# Patient Record
Sex: Female | Born: 1950 | Race: White | Hispanic: No | Marital: Married | Smoking: Never smoker
Health system: Southern US, Community
[De-identification: ages and names within clinical notes are randomized; demographics above are authoritative.]

## PROBLEM LIST (undated history)

## (undated) DIAGNOSIS — I700 Atherosclerosis of aorta: Secondary | ICD-10-CM

## (undated) DIAGNOSIS — I639 Cerebral infarction, unspecified: Secondary | ICD-10-CM

## (undated) DIAGNOSIS — I471 Supraventricular tachycardia, unspecified: Secondary | ICD-10-CM

## (undated) DIAGNOSIS — R05 Cough: Secondary | ICD-10-CM

## (undated) DIAGNOSIS — I2510 Atherosclerotic heart disease of native coronary artery without angina pectoris: Secondary | ICD-10-CM

## (undated) DIAGNOSIS — I10 Essential (primary) hypertension: Secondary | ICD-10-CM

## (undated) DIAGNOSIS — Z8673 Personal history of transient ischemic attack (TIA), and cerebral infarction without residual deficits: Secondary | ICD-10-CM

## (undated) DIAGNOSIS — Z972 Presence of dental prosthetic device (complete) (partial): Secondary | ICD-10-CM

## (undated) DIAGNOSIS — K08109 Complete loss of teeth, unspecified cause, unspecified class: Secondary | ICD-10-CM

## (undated) DIAGNOSIS — G459 Transient cerebral ischemic attack, unspecified: Secondary | ICD-10-CM

## (undated) DIAGNOSIS — R053 Chronic cough: Secondary | ICD-10-CM

## (undated) DIAGNOSIS — N3281 Overactive bladder: Secondary | ICD-10-CM

## (undated) DIAGNOSIS — H66005 Acute suppurative otitis media without spontaneous rupture of ear drum, recurrent, left ear: Secondary | ICD-10-CM

## (undated) DIAGNOSIS — H6690 Otitis media, unspecified, unspecified ear: Secondary | ICD-10-CM

## (undated) DIAGNOSIS — I779 Disorder of arteries and arterioles, unspecified: Secondary | ICD-10-CM

## (undated) DIAGNOSIS — I951 Orthostatic hypotension: Secondary | ICD-10-CM

## (undated) DIAGNOSIS — F419 Anxiety disorder, unspecified: Secondary | ICD-10-CM

## (undated) DIAGNOSIS — I5030 Unspecified diastolic (congestive) heart failure: Secondary | ICD-10-CM

## (undated) DIAGNOSIS — K219 Gastro-esophageal reflux disease without esophagitis: Secondary | ICD-10-CM

## (undated) DIAGNOSIS — J45909 Unspecified asthma, uncomplicated: Secondary | ICD-10-CM

## (undated) DIAGNOSIS — M1990 Unspecified osteoarthritis, unspecified site: Secondary | ICD-10-CM

## (undated) DIAGNOSIS — E785 Hyperlipidemia, unspecified: Secondary | ICD-10-CM

## (undated) DIAGNOSIS — E119 Type 2 diabetes mellitus without complications: Secondary | ICD-10-CM

## (undated) HISTORY — DX: Hyperlipidemia, unspecified: E78.5

## (undated) HISTORY — DX: Orthostatic hypotension: I95.1

## (undated) HISTORY — DX: Acute suppurative otitis media without spontaneous rupture of ear drum, recurrent, left ear: H66.005

## (undated) HISTORY — DX: Supraventricular tachycardia: I47.1

## (undated) HISTORY — DX: Atherosclerosis of aorta: I70.0

## (undated) HISTORY — DX: Overactive bladder: N32.81

## (undated) HISTORY — DX: Atherosclerotic heart disease of native coronary artery without angina pectoris: I25.10

## (undated) HISTORY — DX: Cerebral infarction, unspecified: I63.9

## (undated) HISTORY — DX: Anxiety disorder, unspecified: F41.9

## (undated) HISTORY — PX: CATARACT EXTRACTION W/ INTRAOCULAR LENS IMPLANT: SHX1309

## (undated) HISTORY — DX: Essential (primary) hypertension: I10

## (undated) HISTORY — DX: Transient cerebral ischemic attack, unspecified: G45.9

## (undated) HISTORY — DX: Unspecified asthma, uncomplicated: J45.909

## (undated) HISTORY — PX: ABDOMINAL HYSTERECTOMY: SHX81

## (undated) HISTORY — PX: GAS INSERTION: SHX5336

## (undated) HISTORY — DX: Unspecified diastolic (congestive) heart failure: I50.30

## (undated) HISTORY — PX: CHOLECYSTECTOMY: SHX55

## (undated) HISTORY — DX: Supraventricular tachycardia, unspecified: I47.10

## (undated) HISTORY — PX: LACRIMAL DUCT EXPLORATION: SHX6569

## (undated) HISTORY — DX: Disorder of arteries and arterioles, unspecified: I77.9

---

## 2000-02-28 ENCOUNTER — Inpatient Hospital Stay (HOSPITAL_COMMUNITY): Admission: EM | Admit: 2000-02-28 | Discharge: 2000-03-01 | Payer: Self-pay | Admitting: Emergency Medicine

## 2000-02-28 ENCOUNTER — Encounter: Payer: Self-pay | Admitting: Emergency Medicine

## 2001-02-13 ENCOUNTER — Observation Stay (HOSPITAL_COMMUNITY): Admission: AD | Admit: 2001-02-13 | Discharge: 2001-02-14 | Payer: Self-pay | Admitting: Cardiology

## 2001-02-14 ENCOUNTER — Encounter: Payer: Self-pay | Admitting: Cardiology

## 2001-08-20 ENCOUNTER — Emergency Department (HOSPITAL_COMMUNITY): Admission: EM | Admit: 2001-08-20 | Discharge: 2001-08-20 | Payer: Self-pay | Admitting: *Deleted

## 2002-09-06 ENCOUNTER — Encounter: Payer: Self-pay | Admitting: *Deleted

## 2002-09-06 ENCOUNTER — Emergency Department (HOSPITAL_COMMUNITY): Admission: EM | Admit: 2002-09-06 | Discharge: 2002-09-06 | Payer: Self-pay | Admitting: *Deleted

## 2002-09-17 ENCOUNTER — Emergency Department (HOSPITAL_COMMUNITY): Admission: EM | Admit: 2002-09-17 | Discharge: 2002-09-17 | Payer: Self-pay | Admitting: Emergency Medicine

## 2004-04-08 ENCOUNTER — Emergency Department (HOSPITAL_COMMUNITY): Admission: EM | Admit: 2004-04-08 | Discharge: 2004-04-08 | Payer: Self-pay | Admitting: *Deleted

## 2005-02-01 ENCOUNTER — Emergency Department (HOSPITAL_COMMUNITY): Admission: EM | Admit: 2005-02-01 | Discharge: 2005-02-01 | Payer: Self-pay | Admitting: *Deleted

## 2008-08-31 ENCOUNTER — Emergency Department (HOSPITAL_COMMUNITY): Admission: EM | Admit: 2008-08-31 | Discharge: 2008-08-31 | Payer: Self-pay | Admitting: Emergency Medicine

## 2009-01-20 ENCOUNTER — Ambulatory Visit (HOSPITAL_COMMUNITY): Admission: RE | Admit: 2009-01-20 | Discharge: 2009-01-20 | Payer: Self-pay | Admitting: Obstetrics & Gynecology

## 2009-01-27 ENCOUNTER — Ambulatory Visit (HOSPITAL_COMMUNITY): Admission: RE | Admit: 2009-01-27 | Discharge: 2009-01-27 | Payer: Self-pay | Admitting: Obstetrics & Gynecology

## 2010-03-07 ENCOUNTER — Ambulatory Visit (HOSPITAL_COMMUNITY): Admission: RE | Admit: 2010-03-07 | Discharge: 2010-03-07 | Payer: Self-pay | Admitting: Family Medicine

## 2010-03-15 ENCOUNTER — Ambulatory Visit (HOSPITAL_COMMUNITY): Admission: RE | Admit: 2010-03-15 | Discharge: 2010-03-15 | Payer: Self-pay | Admitting: Family Medicine

## 2010-04-02 ENCOUNTER — Emergency Department (HOSPITAL_COMMUNITY): Admission: EM | Admit: 2010-04-02 | Discharge: 2010-04-02 | Payer: Self-pay | Admitting: Emergency Medicine

## 2010-10-04 ENCOUNTER — Ambulatory Visit (HOSPITAL_COMMUNITY): Admission: RE | Admit: 2010-10-04 | Discharge: 2010-10-04 | Payer: Self-pay | Admitting: Family Medicine

## 2011-04-13 NOTE — Discharge Summary (Signed)
[place]. [facility]  Patient:    Yvonne [name], Yvonne [name]                        MRN: [id number] Adm. Date:  [id number] Disc. Date: [id number] Attending:  Rollene Rotunda Dictator:   Lavella Hammock, P.A. CC:         Rollene Rotunda, M.D. [facility]             Colon Flattery, D.O., [place], [facility]                           Discharge Summary  DATE OF BIRTH:  [birth year]  PROCEDURES: 1. Cardiac catheterization. 2. Coronary arteriogram. 3. Left ventriculogram.  HOSPITAL COURSE:  Mrs. Yvonne [name] is a 60 year old female with no prior history of coronary artery disease who was seen in Dr. Garnette Gunner office for a routine office visit and was having chest pressure and an abnormal EKG.  She was transferred to [facility] for further evaluation.  Her risk factors for coronary heart disease include hyperlipidemia, family history of coronary artery disease, but no history of hypertension or diabetes, although her blood pressure was reportedly elevated in Dr. Garnette Gunner office.  Her EKG at [facility] was unchanged from the one done at Dr. Garnette Gunner office, and she was admitted to rule out MI for further evaluation.  Her enzymes were negative for MI, and she was scheduled for a cardiac catheterization on February 29, 2000.  Cardiac catheterization showed normal left ventricular function with normal coronary arteries except for a 20% stenosis in the mid RCA with the RCA being a dominant system.  She had a vagal reaction to the sheath being removed and required some atropine but was otherwise stable and had no problems with the catheterization site.  The next day, she had no complaints.  Her catheterization site was stable, and she had no episodes of presyncope.  There were no significant arrhythmias documented on the monitor.  Because of this, she was considered stable for discharge on March 01, 2000.  LABORATORY VALUES:  Hemoglobin 12.1, hematocrit 34.8, WBC 5.6, platelets  238. Sodium 142, potassium 3.8, chloride 108, CO2 27, BUN 13, creatinine 0.7, glucose 93.  Serial CK-MB and troponin I negative for MI.  Total cholesterol 228, triglycerides 95, HDL 43, LDL 166.  DISCHARGE CONDITION:  Stable.  CONSULTS:  None.  COMPLICATIONS:  None.  DISCHARGE DIAGNOSES: 1. Chest pain, noncardiac etiology by catheterization, no arrhythmias on    monitor.  Follow up with Dr. Antoine Poche in one month and with Dr. Dewaine Conger. 2. Presyncope.  No episodes in hospital and no arrhythmia documented.    Follow up with Dr. Antoine Poche, possible event recorder p.r.n. 3. Hyperlipidemia.  The patients LDL was 166.  She is to follow a low-fat    diet and follow up with Dr. Dewaine Conger for this. 4. Family history of coronary artery disease. 5. Status post bladder tack and hysterectomy. 6. History of depression.  DISCHARGE INSTRUCTIONS:   She is to do no driving, no strenuous activity, no sexual activity for two days.  She is to stick to a diet that is low in fat and cholesterol.  She is to call the office for bleeding, swelling, or drainage to the catheterization site.  She has an appointment with Dr. Antoine Poche on May 1 at 4:45 p.m., and she is to follow up with Dr. Dewaine Conger and call for an appointment.  DISCHARGE  MEDICATIONS: 1. Premarin 0.625 mg q.d. 2. Celebrex q.d. 3. Zoloft 50 mg q.d. 4. Xanax p.r.n. 5. Protonix 40 mg p.o. q.d. added this admission. DD:  03/01/00 TD:  03/01/00 Job: [id number]

## 2011-04-13 NOTE — Discharge Summary (Signed)
[place]. [facility]  Patient:    Yvonne [name], Yvonne [name]                        MRN: [id number] Adm. Date:  [id number] Disc. Date: [id number] Attending:  Nelta Numbers Dictator:   Joellyn Rued, P.A.-C. CC:         Dr. Leonette Monarch in [place]   Referring Physician Discharge [place]  DATE OF BIRTH:  Jan 26, 1951  SUMMARY OF HISTORY:  Yvonne [name] is a 60 year old white female who was seen recently at [facility] twice this past week for chest discomfort.  She was referred to our office for evaluation and called our office again with chest discomfort and they arranged a direct admission.  She initially presented to [facility] on February 10, 2001 complaining of chest discomfort, right-sided weakness, slurred speech, and facial droop. However, the patient was worked up and discharged from the emergency room with all normal results from a cardiac and neurological standpoint.  On March 19 she followed up with her primary care physician experiencing continuing chest discomfort and was again sent to the emergency room.  That time, she was kept overnight and again evaluation was normal.  On the day of admission around 7:30 she started experiencing chest discomfort which was midsternal pressure 8 on a scale of 0-10, nonradiating.  She was also short of breath, diaphoretic, and nauseated with this.  She felt that the discomfort was similar to her discomfort in April 2001, at which time she had a heart catheterization which showed normal LV function and 20% RCA.  En route to the hospital she received oxygen and nitroglycerin which resolved the chest discomfort.  Her history is notable for mild obesity, hyperlipidemia, hypertension, GERD, migraines, and anxiety.  LABORATORY DATA:  CKs and troponins were negative for myocardial infarction. Sodium 138, potassium 3.9, BUN 18, creatinine 0.9, glucose 87.  H&H 13.5 and 39.3, normal indices, platelets  294, wbcs 7.0.  D-dimer was 0.34.  Fasting lipids showed a mild elevation of her total cholesterol at 219, triglycerides 152, HDL 41, LDL 148.  Her TSH was within normal limits.  EKG showed normal sinus rhythm, nonspecific ST-T wave changes.  HOSPITAL COURSE:  Yvonne [name] was admitted to 5100.  Overnight, she did not have any further chest discomfort and stress Cardiolite was performed. Utilizing the Bruce protocol, she ambulated for a total of 7 minutes and 42 seconds without difficulty.  There were not any EKG changes and she had appropriate blood pressure response.  She complained of shortness of breath and fatigue during the stress test but she did not have any chest discomfort and there were no EKG changes.  Imaging showed an ejection fraction of 80% without any signs of ischemia.  After reviewing with the physician it was felt that she could be discharged home with further workup as an outpatient in regards to her chest discomfort.  DISCHARGE DIAGNOSES: 1. Chest discomfort, unknown etiology. 2. Hyperlipidemia. 3. History as previously.  DISPOSITION:  She is discharged home.  MEDICATIONS:  She is asked to continue: 1. Coated aspirin 325 q.d. 2. Premarin 0.625 q.d. 3. Valium 10 mg q.d. and as needed for sleep. 4. We increased her Protonix 40 mg b.i.d. to see if this would improve her    symptoms.  DIET:  She was asked to maintain a low salt/fat/cholesterol diet.  FOLLOW-UP:  Arrange a follow-up appointment with Dr. Leonette Monarch for further evaluation of her  symptoms.  If she cannot follow a low salt/fat/cholesterol diet with improvement in her lipid panel, consideration should be beginning to add a statin to her medical regimen. DD:  02/14/01 TD:  02/15/01 Job: [id number]

## 2011-04-13 NOTE — Procedures (Signed)
[place]. [facility]  Patient:    Yvonne [name], Yvonne [name]                        MRN: [id number] Proc. Date: 02/29/00 Adm. Date:  [id number] Attending:  Rollene Rotunda CC:         Colon Flattery, D.O.             Rollene Rotunda, M.D. [facility]                           Procedure Report  PROCEDURE PERFORMED:  Left heart catheterization with coronary angiography and eft ventriculography.  INDICATIONS:  Recurrent substernal chest pain with abnormal EKG.  I. INTRODUCTION:  The patient is a 60 year old woman with a history of recurrent chest pain, who was recently seen in Dr. Meredith Leeds office and was found to have an abnormal electrocardiogram.  Associated with her chest pain was presyncope. She also had associated shortness of breath and nausea.  She was subsequently referred for left heart catheterization.  It should be noted that her EKG demonstrates normal sinus rhythm with nonspecific STT-wave changes.  II. PROCEDURE:  After informed consent was obtained, the patient was taken to the diagnostic catheterization laboratory in the fasting state.  After usual preparation and draping, intravenous fentanyl and midazolam were given for conscious sedation.  A 6-French sheath was placed in the right femoral artery and by way of the sheath, the left Judkins catheter was inserted through the right femoral sheath and advanced into the left main coronary system.  Selective coronary angiography of the left main coronary system was then carried out.  Following this, the left Judkins catheter was removed and the right Judkins catheter was inserted through the right femoral artery and advanced into the right coronary artery. Selective coronary angiography of the right coronary system was then carried out. Following this, the right Judkins catheter was removed and the pigtail catheter was inserted through the right femoral artery and advanced retrograde across  the aortic valve into the left ventricle.  Left ventriculography in the RAO projection with a total of 30 cc of contrast was then carried out.  Following this, the catheters  were removed.  Hemostasis was assured and the patient returned to her room in good condition.  III. COMPLICATIONS:  None.  IV. RESULTS:  A. HEMODYNAMICS:  The LV pressure was 116/15 and the aortic pressure was 125/67.  B. LEFT VENTRICULOGRAPHY:  Left ventriculography in the RAO projection was performed with a total of 30 cc of contrast delivered over 2.5 seconds.  This demonstrated normal left ventricular systolic function with no segmental wall motion abnormalities.  C. CORONARY ANGIOGRAPHY:  The left main coronary artery gave rise to a left anterior descending and a left circumflex coronary artery.  The left main coronary artery had no angiographic stenosis and the left anterior descending and its first and second diagonal branches and the left circumflex and its first and second obtuse marginal branches all had no angiographic disease.  I should note that the A-V circumflex was a fairly small vessel.  The right coronary artery was dominant and had a 20% eccentric mid stenosis.  There was no other evidence of obstruction.  V. RESULTS/CONCLUSION:  This study demonstrates normal left ventricular systolic function.  No evidence of obstructive coronary disease in the left main, LAD, or left circumflex systems.  It demonstrates a 20% luminal narrowing in the right coronary  artery. DD:  02/29/00 TD:  02/29/00 Job: [id number]

## 2011-07-13 ENCOUNTER — Other Ambulatory Visit (HOSPITAL_COMMUNITY): Payer: Self-pay | Admitting: Family Medicine

## 2011-07-13 DIAGNOSIS — Z139 Encounter for screening, unspecified: Secondary | ICD-10-CM

## 2011-07-17 ENCOUNTER — Ambulatory Visit (HOSPITAL_COMMUNITY)
Admission: RE | Admit: 2011-07-17 | Discharge: 2011-07-17 | Disposition: A | Discharge status: Home / Self Care | Payer: Medicare Other | Source: Ambulatory Visit | Attending: Family Medicine | Admitting: Family Medicine

## 2011-07-17 DIAGNOSIS — Z1231 Encounter for screening mammogram for malignant neoplasm of breast: Secondary | ICD-10-CM | POA: Insufficient documentation

## 2011-07-17 DIAGNOSIS — Z139 Encounter for screening, unspecified: Secondary | ICD-10-CM

## 2011-07-19 ENCOUNTER — Ambulatory Visit (HOSPITAL_COMMUNITY): Payer: Self-pay

## 2011-08-27 LAB — STREP A DNA PROBE

## 2012-06-09 ENCOUNTER — Other Ambulatory Visit (HOSPITAL_COMMUNITY): Payer: Self-pay | Admitting: Family Medicine

## 2012-06-09 DIAGNOSIS — Z139 Encounter for screening, unspecified: Secondary | ICD-10-CM

## 2012-07-21 ENCOUNTER — Ambulatory Visit (HOSPITAL_COMMUNITY)
Admission: RE | Admit: 2012-07-21 | Discharge: 2012-07-21 | Disposition: A | Discharge status: Home / Self Care | Payer: Medicare Other | Source: Ambulatory Visit | Attending: Family Medicine | Admitting: Family Medicine

## 2012-07-21 ENCOUNTER — Ambulatory Visit (HOSPITAL_COMMUNITY): Payer: Medicare Other

## 2012-07-21 DIAGNOSIS — Z139 Encounter for screening, unspecified: Secondary | ICD-10-CM

## 2012-07-21 DIAGNOSIS — Z1231 Encounter for screening mammogram for malignant neoplasm of breast: Secondary | ICD-10-CM | POA: Insufficient documentation

## 2013-09-22 ENCOUNTER — Other Ambulatory Visit (HOSPITAL_COMMUNITY): Payer: Self-pay | Admitting: Family Medicine

## 2013-09-22 DIAGNOSIS — Z139 Encounter for screening, unspecified: Secondary | ICD-10-CM

## 2013-09-28 ENCOUNTER — Ambulatory Visit (HOSPITAL_COMMUNITY)
Admission: RE | Admit: 2013-09-28 | Discharge: 2013-09-28 | Disposition: A | Discharge status: Home / Self Care | Payer: Medicare Other | Source: Ambulatory Visit | Attending: Family Medicine | Admitting: Family Medicine

## 2013-09-28 DIAGNOSIS — Z139 Encounter for screening, unspecified: Secondary | ICD-10-CM

## 2013-09-28 DIAGNOSIS — Z1231 Encounter for screening mammogram for malignant neoplasm of breast: Secondary | ICD-10-CM | POA: Insufficient documentation

## 2014-03-18 ENCOUNTER — Telehealth: Payer: Self-pay | Admitting: Nurse Practitioner

## 2014-03-18 NOTE — Telephone Encounter (Signed)
APPT NOT CHANGED

## 2014-08-17 DIAGNOSIS — K219 Gastro-esophageal reflux disease without esophagitis: Secondary | ICD-10-CM | POA: Insufficient documentation

## 2014-09-13 ENCOUNTER — Other Ambulatory Visit (HOSPITAL_COMMUNITY): Payer: Self-pay | Admitting: Physician Assistant

## 2014-09-13 DIAGNOSIS — Z1231 Encounter for screening mammogram for malignant neoplasm of breast: Secondary | ICD-10-CM

## 2014-10-04 ENCOUNTER — Ambulatory Visit (HOSPITAL_COMMUNITY): Payer: Medicare Other

## 2014-10-14 ENCOUNTER — Ambulatory Visit (HOSPITAL_COMMUNITY): Payer: Medicare Other

## 2014-12-08 ENCOUNTER — Ambulatory Visit (HOSPITAL_COMMUNITY)
Admission: RE | Admit: 2014-12-08 | Discharge: 2014-12-08 | Disposition: A | Discharge status: Home / Self Care | Payer: Medicare HMO | Source: Ambulatory Visit | Attending: Family Medicine | Admitting: Family Medicine

## 2014-12-08 ENCOUNTER — Other Ambulatory Visit (HOSPITAL_COMMUNITY): Payer: Self-pay | Admitting: Family Medicine

## 2014-12-08 DIAGNOSIS — Z1231 Encounter for screening mammogram for malignant neoplasm of breast: Secondary | ICD-10-CM | POA: Diagnosis present

## 2015-03-23 ENCOUNTER — Encounter: Payer: Self-pay | Admitting: Family Medicine

## 2015-03-23 ENCOUNTER — Telehealth: Payer: Self-pay | Admitting: Family Medicine

## 2015-03-23 DIAGNOSIS — F411 Generalized anxiety disorder: Secondary | ICD-10-CM | POA: Insufficient documentation

## 2015-03-23 DIAGNOSIS — N3281 Overactive bladder: Secondary | ICD-10-CM | POA: Insufficient documentation

## 2015-03-23 DIAGNOSIS — I10 Essential (primary) hypertension: Secondary | ICD-10-CM | POA: Insufficient documentation

## 2015-03-23 NOTE — Telephone Encounter (Signed)
Patient will sign release to have records sent to our office. She was previously being seen in [facility] and she lives in [place]. She brings her grandchildren to our office for their care.

## 2015-05-04 ENCOUNTER — Encounter: Payer: Self-pay | Admitting: Family Medicine

## 2015-05-04 ENCOUNTER — Ambulatory Visit (INDEPENDENT_AMBULATORY_CARE_PROVIDER_SITE_OTHER): Payer: Medicare HMO | Admitting: Family Medicine

## 2015-05-04 VITALS — BP 159/86 | HR 78 | Temp 97.8°F | Ht 63.0 in | Wt 182.4 lb

## 2015-05-04 DIAGNOSIS — M1990 Unspecified osteoarthritis, unspecified site: Secondary | ICD-10-CM | POA: Diagnosis not present

## 2015-05-04 MED ORDER — HYDROCODONE-ACETAMINOPHEN 10-325 MG PO TABS: 1.0000 | ORAL_TABLET | Freq: Three times a day (TID) | ORAL | Status: DC

## 2015-05-04 MED ORDER — MIRABEGRON ER 50 MG PO TB24: 50.0000 mg | ORAL_TABLET | Freq: Every day | ORAL | Status: DC

## 2015-05-04 MED ORDER — AMLODIPINE BESYLATE 5 MG PO TABS: 5.0000 mg | ORAL_TABLET | Freq: Every day | ORAL | Status: DC

## 2015-05-04 MED ORDER — ALPRAZOLAM 1 MG PO TABS: 1.0000 mg | ORAL_TABLET | Freq: Three times a day (TID) | ORAL | Status: DC

## 2015-05-04 NOTE — Patient Instructions (Signed)

## 2015-05-04 NOTE — Progress Notes (Signed)
Subjective:  Patient ID: Yvonne [name], female    DOB: November 19, 1951  Age: 64 y.o. MRN: [id number]  CC: Establish Care; Arthritis; Hypertension; and GAD   HPI Yvonne [name] presents for  follow-up of hypertension. Patient has no history of headache chest pain or shortness of breath or recent cough. Patient also denies symptoms of TIA such as numbness weakness lateralizing. Patient checks  blood pressure at home and has not had any elevated readings recently. Patient denies side effects from his medication. States taking it regularly.  Dismissed from [facility] due to drug test in Dec. 2015.  Pain med level wasn't high enough. 8-9/10 worst at left knee affects both hips & legs. Feet feel like needles in them. Neg test for DM done recently. Out of meds for a week. Sees specialist for asthma  Severe overactive bladder   History Adryan has a past medical history of Overactive bladder; Hypertension; Asthma; Chronic bronchitis; Anxiety; and Chronic leg pain.   She has no past surgical history on file.   Her family history includes Arthritis in her brother and mother; Cancer in her brother; Diabetes in her father and mother; Heart disease in her father; Hepatitis C in her sister.She has no tobacco, alcohol, and drug history on file.  Current Outpatient Prescriptions on File Prior to Visit  Medication Sig Dispense Refill  . albuterol (PROVENTIL HFA;VENTOLIN HFA) 108 (90 BASE) MCG/ACT inhaler Inhale 2 puffs into the lungs.    [name] Kitchen albuterol (PROVENTIL) (2.5 MG/3ML) 0.083% nebulizer solution 2.5 mg.    . ipratropium-albuterol (DUONEB) 0.5-2.5 (3) MG/3ML SOLN 4 (four) times a day as needed.    [name] Kitchen omeprazole (PRILOSEC) 20 MG capsule Take 1 capsule by mouth.     No current facility-administered medications on file prior to visit.    ROS Review of Systems  Constitutional: Negative for fever, chills, diaphoresis, appetite change, fatigue and unexpected weight change.  HENT: Negative for congestion,  ear pain, hearing loss, postnasal drip, rhinorrhea, sneezing, sore throat and trouble swallowing.   Eyes: Negative for pain.  Respiratory: Negative for cough, chest tightness and shortness of breath.   Cardiovascular: Negative for chest pain and palpitations.  Gastrointestinal: Negative for nausea, vomiting, abdominal pain, diarrhea and constipation.  Genitourinary: Negative for dysuria, frequency and menstrual problem.  Musculoskeletal: Negative for joint swelling and arthralgias.  Skin: Negative for rash.  Neurological: Negative for dizziness, weakness, numbness and headaches.  Psychiatric/Behavioral: Negative for dysphoric mood and agitation.    Objective:  BP 159/86 mmHg  Pulse 78  Temp(Src) 97.8 F (36.6 C) (Oral)  Ht 5\' 3"  (1.6 m)  Wt 182 lb 6.4 oz (82.736 kg)  BMI 32.32 kg/m2  BP Readings from Last 3 Encounters:  05/04/15 159/86    Wt Readings from Last 3 Encounters:  05/04/15 182 lb 6.4 oz (82.736 kg)     Physical Exam  Constitutional: She is oriented to person, place, and time. She appears well-developed and well-nourished. No distress.  HENT:  Head: Normocephalic and atraumatic.  Right Ear: External ear normal.  Left Ear: External ear normal.  Nose: Nose normal.  Mouth/Throat: Oropharynx is clear and moist.  Eyes: Conjunctivae and EOM are normal. Pupils are equal, round, and reactive to light.  Neck: Normal range of motion. Neck supple. No thyromegaly present.  Cardiovascular: Normal rate, regular rhythm and normal heart sounds.   No murmur heard. Pulmonary/Chest: Effort normal and breath sounds normal. No respiratory distress. She has no wheezes. She has no rales.  Abdominal:  Soft. Bowel sounds are normal. She exhibits no distension. There is no tenderness.  Lymphadenopathy:    She has no cervical adenopathy.  Neurological: She is alert and oriented to person, place, and time. She has normal reflexes.  Skin: Skin is warm and dry.  Psychiatric: She has a  normal mood and affect. Her behavior is normal. Judgment and thought content normal.    No results found for: HGBA1C  No results found for: WBC, HGB, HCT, PLT, GLUCOSE, CHOL, TRIG, HDL, LDLDIRECT, LDLCALC, ALT, AST, NA, K, CL, CREATININE, BUN, CO2, TSH, PSA, INR, GLUF, HGBA1C, MICROALBUR  Mm Digital Screening Bilateral  12/08/2014   CLINICAL DATA:  Screening.  EXAM: DIGITAL SCREENING BILATERAL MAMMOGRAM WITH CAD  COMPARISON:  Previous exam(s).  ACR Breast Density Category b: There are scattered areas of fibroglandular density.  FINDINGS: There are no findings suspicious for malignancy. Images were processed with CAD.  IMPRESSION: No mammographic evidence of malignancy. A result letter of this screening mammogram will be mailed directly to the patient.  RECOMMENDATION: Screening mammogram in one year. (Code:SM-B-01Y)  BI-RADS CATEGORY  1: Negative.   Electronically Signed   By: Enrique Sack M.D.   On: 12/08/2014 15:37    Assessment & Plan:   Christana was seen today for establish care, arthritis, hypertension and gad.  Diagnoses and all orders for this visit:  Arthritis Orders: -     Ambulatory referral to Pain Clinic  Other orders -     Discontinue: mirabegron ER (MYRBETRIQ) 50 MG TB24 tablet; Take 1 tablet (50 mg total) by mouth daily. -     ALPRAZolam (XANAX) 1 MG tablet; Take 1 tablet (1 mg total) by mouth 3 (three) times daily. TAKE (1) TABLET THREE TIMES DAILY. -     amLODipine (NORVASC) 5 MG tablet; Take 1 tablet (5 mg total) by mouth daily. -     HYDROcodone-acetaminophen (NORCO) 10-325 MG per tablet; Take 1 tablet by mouth 3 (three) times daily.   I have discontinued Ms. Stevick's oxybutynin. I have also changed her ALPRAZolam and amLODipine. Additionally, I am having her maintain her omeprazole, albuterol, albuterol, ipratropium-albuterol, furosemide, and HYDROcodone-acetaminophen.  Meds ordered this encounter  Medications  . furosemide (LASIX) 20 MG tablet    Sig: Take 20 mg by  mouth daily.  [name] Kitchen DISCONTD: mirabegron ER (MYRBETRIQ) 50 MG TB24 tablet    Sig: Take 1 tablet (50 mg total) by mouth daily.    Dispense:  30 tablet    Refill:  2  . ALPRAZolam (XANAX) 1 MG tablet    Sig: Take 1 tablet (1 mg total) by mouth 3 (three) times daily. TAKE (1) TABLET THREE TIMES DAILY.    Dispense:  90 tablet    Refill:  2  . amLODipine (NORVASC) 5 MG tablet    Sig: Take 1 tablet (5 mg total) by mouth daily.    Dispense:  30 tablet    Refill:  5  . HYDROcodone-acetaminophen (NORCO) 10-325 MG per tablet    Sig: Take 1 tablet by mouth 3 (three) times daily.    Dispense:  90 tablet    Refill:  0     Follow-up: Return in about 3 months (around 08/04/2015).  [name], M.D.

## 2015-05-05 ENCOUNTER — Telehealth: Payer: Self-pay | Admitting: Family Medicine

## 2015-05-05 MED ORDER — SOLIFENACIN SUCCINATE 10 MG PO TABS: 10.0000 mg | ORAL_TABLET | Freq: Every day | ORAL | Status: DC

## 2015-05-05 NOTE — Telephone Encounter (Signed)
Please review and advise.

## 2015-05-05 NOTE — Telephone Encounter (Signed)
See if Vesicare 10 mg by mouth once daily is any cheaper.

## 2015-05-05 NOTE — Telephone Encounter (Signed)
RX for Vesicare sent into pharmacy Pt notified

## 2015-05-06 ENCOUNTER — Telehealth: Payer: Self-pay | Admitting: Family Medicine

## 2015-05-06 MED ORDER — OXYBUTYNIN CHLORIDE ER 15 MG PO TB24: 15.0000 mg | ORAL_TABLET | Freq: Two times a day (BID) | ORAL | Status: DC

## 2015-05-06 NOTE — Telephone Encounter (Signed)
[name] , please address

## 2015-05-06 NOTE — Telephone Encounter (Signed)
I am happy to do this, but let her know the dose will be different - less frequent and more effective than what she has now, but admittedly not as good as the ones I tried to prescribe.

## 2015-05-06 NOTE — Telephone Encounter (Signed)
Pt thinks we may can do prior arth on the original med- she will bring in paperwork on Monday.

## 2015-05-11 ENCOUNTER — Telehealth: Payer: Self-pay | Admitting: Family Medicine

## 2015-05-11 DIAGNOSIS — I10 Essential (primary) hypertension: Secondary | ICD-10-CM

## 2015-05-11 NOTE — Telephone Encounter (Signed)
TC to pt - pain referral in the works, placed referral for GI & to Dr. Percival Spanish for echo, she has seen him in the past

## 2015-05-11 NOTE — Telephone Encounter (Signed)
PLease arrange for each of these. Thanks, WS

## 2015-05-17 ENCOUNTER — Encounter (INDEPENDENT_AMBULATORY_CARE_PROVIDER_SITE_OTHER): Payer: Self-pay | Admitting: *Deleted

## 2015-06-03 ENCOUNTER — Encounter: Payer: Self-pay | Admitting: Family Medicine

## 2015-06-03 ENCOUNTER — Ambulatory Visit (INDEPENDENT_AMBULATORY_CARE_PROVIDER_SITE_OTHER): Payer: Medicare HMO | Admitting: Family Medicine

## 2015-06-03 VITALS — BP 139/76 | HR 78 | Temp 97.1°F | Ht 63.0 in | Wt 180.8 lb

## 2015-06-03 DIAGNOSIS — F419 Anxiety disorder, unspecified: Secondary | ICD-10-CM

## 2015-06-03 DIAGNOSIS — I10 Essential (primary) hypertension: Secondary | ICD-10-CM

## 2015-06-03 DIAGNOSIS — N3281 Overactive bladder: Secondary | ICD-10-CM

## 2015-06-03 DIAGNOSIS — Z139 Encounter for screening, unspecified: Secondary | ICD-10-CM

## 2015-06-03 LAB — POCT CBC
Granulocyte percent: 58.7 %G (ref 37–80)
HCT, POC: 40.2 % (ref 37.7–47.9)
Hemoglobin: 13.3 g/dL (ref 12.2–16.2)
LYMPH, POC: 2.1 (ref 0.6–3.4)
MCH, POC: 28 pg (ref 27–31.2)
MCHC: 33 g/dL (ref 31.8–35.4)
MCV: 84.8 fL (ref 80–97)
MPV: 8 fL (ref 0–99.8)
POC GRANULOCYTE: 3.5 (ref 2–6.9)
POC LYMPH PERCENT: 34.5 %L (ref 10–50)
Platelet Count, POC: 228 10*3/uL (ref 142–424)
RBC: 4.74 M/uL (ref 4.04–5.48)
RDW, POC: 12.8 %
WBC: 6 10*3/uL (ref 4.6–10.2)

## 2015-06-03 MED ORDER — HYDROCODONE-ACETAMINOPHEN 10-325 MG PO TABS: 1.0000 | ORAL_TABLET | Freq: Three times a day (TID) | ORAL | Status: DC

## 2015-06-03 MED ORDER — AMLODIPINE BESYLATE 5 MG PO TABS: 5.0000 mg | ORAL_TABLET | Freq: Every day | ORAL | Status: DC

## 2015-06-03 NOTE — Addendum Note (Signed)
Addended by: [name] on: 06/03/2015 07:48 PM   Modules accepted: Miquel Dunn

## 2015-06-03 NOTE — Progress Notes (Signed)
Subjective:  Patient ID: Yvonne [name], female    DOB: 04/03/1951  Age: 64 y.o. MRN: [id number]  CC: Hypertension; Anxiety; Gastrophageal Reflux; and Arthritis   HPI Yvonne [name] presents for  follow-up of hypertension. Patient has no history of headache chest pain or shortness of breath or recent cough. Patient also denies symptoms of TIA such as numbness weakness lateralizing. Patient checks  blood pressure at home and has not had any elevated readings recently. Patient denies side effects from his medication. States taking it regularly. Patient in for follow-up of GERD. Currently asymptomatic taking  PPI daily. There is no chest pain or heartburn. No hematemesis and no melena. No dysphagia or choking. Onset is remote. Progression is stable. Complicating factors, none. She continues to have arthritis pain in multiple joints. She was to be seen in the pain clinic by now but this has not happened. She needs to have her hydrocodone as she is dependent on it. Pain level is in the 6 to 7/10 range without the med. She was unable to get the Myrbetriq. She's come back on the Ditropan for now. History Yvonne [name] has a past medical history of Overactive bladder; Hypertension; Asthma; Chronic bronchitis; Anxiety; and Chronic leg pain.   She has no past surgical history on file.   Her family history includes Arthritis in her brother and mother; Cancer in her brother; Diabetes in her father and mother; Heart disease in her father; Hepatitis C in her sister.She reports that she has never smoked. She does not have any smokeless tobacco history on file. She reports that she does not drink alcohol or use illicit drugs.  Current Outpatient Prescriptions on File Prior to Visit  Medication Sig Dispense Refill  . albuterol (PROVENTIL HFA;VENTOLIN HFA) 108 (90 BASE) MCG/ACT inhaler Inhale 2 puffs into the lungs.    [name] Kitchen albuterol (PROVENTIL) (2.5 MG/3ML) 0.083% nebulizer solution 2.5 mg.    . ALPRAZolam (XANAX) 1 MG  tablet Take 1 tablet (1 mg total) by mouth 3 (three) times daily. TAKE (1) TABLET THREE TIMES DAILY. 90 tablet 2  . furosemide (LASIX) 20 MG tablet Take 20 mg by mouth daily.    [name] Kitchen ipratropium-albuterol (DUONEB) 0.5-2.5 (3) MG/3ML SOLN 4 (four) times a day as needed.    [name] Kitchen omeprazole (PRILOSEC) 20 MG capsule Take 1 capsule by mouth.    . oxybutynin (DITROPAN XL) 15 MG 24 hr tablet Take 1 tablet (15 mg total) by mouth 2 (two) times daily. 60 tablet 5   No current facility-administered medications on file prior to visit.    ROS Review of Systems  Constitutional: Negative for fever, chills, diaphoresis, appetite change, fatigue and unexpected weight change.  HENT: Negative for congestion, ear pain, hearing loss, postnasal drip, rhinorrhea, sneezing, sore throat and trouble swallowing.   Eyes: Negative for pain.  Respiratory: Negative for cough, chest tightness and shortness of breath.   Cardiovascular: Negative for chest pain and palpitations.  Gastrointestinal: Negative for nausea, vomiting, abdominal pain, diarrhea and constipation.  Genitourinary: Negative for dysuria, frequency and menstrual problem.  Musculoskeletal: Negative for joint swelling and arthralgias.  Skin: Negative for rash.  Neurological: Negative for dizziness, weakness, numbness and headaches.  Psychiatric/Behavioral: Negative for dysphoric mood and agitation.    Objective:  BP 139/76 mmHg  Pulse 78  Temp(Src) 97.1 F (36.2 C) (Oral)  Ht _0  (1.6 m)  Wt 180 lb 12.8 oz (82.01 kg)  BMI 32.04 kg/m2  BP Readings from Last 3 Encounters:  06/03/15 139/76  05/04/15 159/86    Wt Readings from Last 3 Encounters:  06/03/15 180 lb 12.8 oz (82.01 kg)  05/04/15 182 lb 6.4 oz (82.736 kg)     Physical Exam  Constitutional: She is oriented to person, place, and time. She appears well-developed and well-nourished. No distress.  HENT:  Head: Normocephalic and atraumatic.  Right Ear: External ear normal.  Left Ear:  External ear normal.  Nose: Nose normal.  Mouth/Throat: Oropharynx is clear and moist.  Eyes: Conjunctivae and EOM are normal. Pupils are equal, round, and reactive to light.  Neck: Normal range of motion. Neck supple. No thyromegaly present.  Cardiovascular: Normal rate, regular rhythm and normal heart sounds.   No murmur heard. Pulmonary/Chest: Effort normal and breath sounds normal. No respiratory distress. She has no wheezes. She has no rales.  Abdominal: Soft. Bowel sounds are normal. She exhibits no distension. There is no tenderness.  Lymphadenopathy:    She has no cervical adenopathy.  Neurological: She is alert and oriented to person, place, and time. She has normal reflexes.  Skin: Skin is warm and dry.  Psychiatric: She has a normal mood and affect. Her behavior is normal. Judgment and thought content normal.    No results found for: HGBA1C  Lab Results  Component Value Date   WBC 6.0 06/03/2015   HGB 13.3 06/03/2015   HCT 40.2 06/03/2015    Mm Digital Screening Bilateral  12/08/2014   CLINICAL DATA:  Screening.  EXAM: DIGITAL SCREENING BILATERAL MAMMOGRAM WITH CAD  COMPARISON:  Previous exam(s).  ACR Breast Density Category b: There are scattered areas of fibroglandular density.  FINDINGS: There are no findings suspicious for malignancy. Images were processed with CAD.  IMPRESSION: No mammographic evidence of malignancy. A result letter of this screening mammogram will be mailed directly to the patient.  RECOMMENDATION: Screening mammogram in one year. (Code:SM-B-01Y)  BI-RADS CATEGORY  1: Negative.   Electronically Signed   By: Enrique Sack M.D.   On: 12/08/2014 15:37    Assessment & Plan:   Yvonne [name] was seen today for hypertension, anxiety, gastrophageal reflux and arthritis.  Diagnoses and all orders for this visit:  Anxiety  Essential hypertension Orders: -     POCT CBC; Standing -     CMP14+EGFR; Standing -     Lipid panel; Standing -     Thyroid Panel With  TSH; Standing -     POCT CBC -     CMP14+EGFR -     Lipid panel -     Thyroid Panel With TSH  Overactive bladder Orders: -     CMP14+EGFR; Standing -     CMP14+EGFR  Screening Orders: -     Vit D  25 hydroxy (rtn osteoporosis monitoring); Standing -     DG Bone Density; Future -     Vit D  25 hydroxy (rtn osteoporosis monitoring)  Other orders -     HYDROcodone-acetaminophen (NORCO) 10-325 MG per tablet; Take 1 tablet by mouth 3 (three) times daily. Take twice daily for 2wk, then daily for 2 wk, then DC -     amLODipine (NORVASC) 5 MG tablet; Take 1 tablet (5 mg total) by mouth daily.   I have discontinued Ms. Vinsant's solifenacin. I have also changed her HYDROcodone-acetaminophen. Additionally, I am having her maintain her omeprazole, albuterol, albuterol, ipratropium-albuterol, furosemide, ALPRAZolam, oxybutynin, and amLODipine.  Meds ordered this encounter  Medications  . HYDROcodone-acetaminophen (NORCO) 10-325 MG per tablet    Sig: Take 1 tablet  by mouth 3 (three) times daily. Take twice daily for 2wk, then daily for 2 wk, then DC    Dispense:  42 tablet    Refill:  0  . amLODipine (NORVASC) 5 MG tablet    Sig: Take 1 tablet (5 mg total) by mouth daily.    Dispense:  30 tablet    Refill:  5   She will be tapered off of her hydrocodone slowly during this month. At the same time we will work on getting her seen at the pain clinic.  Follow-up: Return in about 6 months (around 12/04/2015).  [name], M.D.

## 2015-06-04 LAB — VITAMIN D 25 HYDROXY (VIT D DEFICIENCY, FRACTURES): Vit D, 25-Hydroxy: 20 ng/mL — ABNORMAL LOW (ref 30.0–100.0)

## 2015-06-04 LAB — CMP14+EGFR
ALBUMIN: 4.3 g/dL (ref 3.6–4.8)
ALK PHOS: 105 IU/L (ref 39–117)
ALT: 9 IU/L (ref 0–32)
AST: 15 IU/L (ref 0–40)
Albumin/Globulin Ratio: 2 (ref 1.1–2.5)
BUN / CREAT RATIO: 16 (ref 11–26)
BUN: 14 mg/dL (ref 8–27)
Bilirubin Total: 0.5 mg/dL (ref 0.0–1.2)
CALCIUM: 9.6 mg/dL (ref 8.7–10.3)
CO2: 24 mmol/L (ref 18–29)
CREATININE: 0.87 mg/dL (ref 0.57–1.00)
Chloride: 103 mmol/L (ref 97–108)
GFR calc Af Amer: 81 mL/min/{1.73_m2} (ref >59)
GFR calc non Af Amer: 70 mL/min/{1.73_m2} (ref >59)
GLOBULIN, TOTAL: 2.2 g/dL (ref 1.5–4.5)
Glucose: 102 mg/dL — ABNORMAL HIGH (ref 65–99)
Potassium: 4.6 mmol/L (ref 3.5–5.2)
Sodium: 140 mmol/L (ref 134–144)
Total Protein: 6.5 g/dL (ref 6.0–8.5)

## 2015-06-04 LAB — THYROID PANEL WITH TSH
Free Thyroxine Index: 2.3 (ref 1.2–4.9)
T3 Uptake Ratio: 27 % (ref 24–39)
T4, Total: 8.7 ug/dL (ref 4.5–12.0)
TSH: 1.58 u[IU]/mL (ref 0.450–4.500)

## 2015-06-04 LAB — LIPID PANEL
CHOL/HDL RATIO: 4.4 ratio (ref 0.0–4.4)
Cholesterol, Total: 238 mg/dL — ABNORMAL HIGH (ref 100–199)
HDL: 54 mg/dL (ref >39)
LDL Calculated: 163 mg/dL — ABNORMAL HIGH (ref 0–99)
Triglycerides: 103 mg/dL (ref 0–149)
VLDL CHOLESTEROL CAL: 21 mg/dL (ref 5–40)

## 2015-06-05 ENCOUNTER — Other Ambulatory Visit: Payer: Self-pay | Admitting: Family Medicine

## 2015-06-05 MED ORDER — VITAMIN D (ERGOCALCIFEROL) 1.25 MG (50000 UNIT) PO CAPS: 50000.0000 [IU] | ORAL_CAPSULE | ORAL | Status: DC

## 2015-06-08 ENCOUNTER — Telehealth: Payer: Self-pay | Admitting: *Deleted

## 2015-06-08 MED ORDER — ATORVASTATIN CALCIUM 40 MG PO TABS: 40.0000 mg | ORAL_TABLET | Freq: Every day | ORAL | Status: DC

## 2015-06-08 NOTE — Telephone Encounter (Signed)
Pt notified of results Verbalizes understanding Rxs sent into [facility] Okayed per Dr [name]

## 2015-06-08 NOTE — Telephone Encounter (Signed)
-----   Message from [name], MD sent at 06/05/2015  8:50 AM EDT ----- Your cholesterol is too high. It would benefit from medication. Without treatment, risk for heart attack, stroke and other medical conditions is high. I recommend atorvastatin. 40 mg daily with supper should reduce your risk significantly.   Your Vitamin D is quite low. You need a high dose supplement. Use 50,000 units twice weekly for two months. I will send that in as a 1 time only prescription.  Then switch to OTC Vitamin D 2000 units daily. Recheck vitamin D level with office visit in 6 months.  Nurse: please send in a prescription to pt.s pharmacy, if they are willing, for atorvastatin 40 mg. One daily.  6 mos supply.  Make sure they are set up for a followup in 6 mos. Ask them to come 2 days early for fasting Vit D, NMR and CMP.

## 2015-06-13 ENCOUNTER — Telehealth: Payer: Self-pay | Admitting: Family Medicine

## 2015-06-15 ENCOUNTER — Ambulatory Visit (INDEPENDENT_AMBULATORY_CARE_PROVIDER_SITE_OTHER): Payer: Medicare HMO | Admitting: Internal Medicine

## 2015-06-24 ENCOUNTER — Telehealth: Payer: Self-pay | Admitting: Family Medicine

## 2015-06-27 MED ORDER — AMLODIPINE BESYLATE 5 MG PO TABS: 5.0000 mg | ORAL_TABLET | Freq: Every day | ORAL | Status: DC

## 2015-06-27 MED ORDER — FUROSEMIDE 20 MG PO TABS: 20.0000 mg | ORAL_TABLET | Freq: Every day | ORAL | Status: DC

## 2015-06-27 MED ORDER — OMEPRAZOLE 20 MG PO CPDR: 20.0000 mg | DELAYED_RELEASE_CAPSULE | Freq: Every day | ORAL | Status: DC

## 2015-06-27 MED ORDER — ATORVASTATIN CALCIUM 40 MG PO TABS: 40.0000 mg | ORAL_TABLET | Freq: Every day | ORAL | Status: DC

## 2015-06-27 MED ORDER — OXYBUTYNIN CHLORIDE ER 15 MG PO TB24: 15.0000 mg | ORAL_TABLET | Freq: Two times a day (BID) | ORAL | Status: DC

## 2015-06-27 NOTE — Telephone Encounter (Signed)
I am not comfortable with that since she also takes hydrocodone. Thanks, WS

## 2015-06-27 NOTE — Telephone Encounter (Signed)
Maintenance meds sent in to [facility] for 90 day supply.  She is also requesting that we send her xanax prescription for mailorder. She gets 90 per month so that would be 270. Would you be comfortable with that or should she continue getting the controlled substances locally?

## 2015-06-28 NOTE — Telephone Encounter (Signed)
Patient aware that she will need to continue to get her xanax at local pharmacy.

## 2015-07-06 ENCOUNTER — Ambulatory Visit (INDEPENDENT_AMBULATORY_CARE_PROVIDER_SITE_OTHER): Payer: Medicare HMO | Admitting: Cardiology

## 2015-07-06 ENCOUNTER — Encounter: Payer: Self-pay | Admitting: Cardiology

## 2015-07-06 VITALS — BP 110/78 | HR 79 | Ht 62.0 in | Wt 180.0 lb

## 2015-07-06 DIAGNOSIS — R072 Precordial pain: Secondary | ICD-10-CM | POA: Insufficient documentation

## 2015-07-06 NOTE — Patient Instructions (Signed)
Medication Instructions:  Your physician recommends that you continue on your current medications as directed. Please refer to the Current Medication list given to you today.  Testing/Procedures: Your physician has requested that you have a lexiscan myoview. For further information please visit [email or web address]. Please follow instruction sheet, as given.  Follow-Up: As Needed after your testing.  Thank you for choosing [facility]!!

## 2015-07-06 NOTE — Progress Notes (Signed)
Cardiology Office Note   Date:  07/06/2015   ID:  Yvonne [name], DOB November 28, 1950, MRN [id number]  PCP:  Yvonne Fraise, MD  Cardiologist:   Yvonne Breeding, MD   Chief Complaint  Patient presents with  . Chest Pain      History of Present Illness: Yvonne [name] is a 64 y.o. female who presents for evaluation of chest discomfort. I saw her in the very distant past for palpitations. She does report having a stress test several years ago for chest pain but I don't have these results. She otherwise does not have a cardiac history. She says she's been getting chest discomfort on and off for months. It happens only with emotional stress and is not completely reproducible. It seems to be sporadic.  It might happen at times with emotional stress and at other times will not. She's not describing this with physical exertion and in fact she vacuumed this morning without bringing on any chest discomfort. She does not think it's increasing in its intensity or severity but she might be noticing it somewhat more frequently. She describes some discomfort under her chest with no radiation to her jaw. She might have some numbness in her left arm. They last for about 5 minutes or so. She doesn't take anything to relieve it but it goes away spontaneously. She might get nauseated. She diaphoretic or short of breath.   Past Medical History  Diagnosis Date  . Overactive bladder   . Hypertension   . Asthma   . Chronic bronchitis   . Anxiety   . Chronic leg pain   . Hyperlipidemia     Past Surgical History  Procedure Laterality Date  . Abdominal hysterectomy    . Cholecystectomy       Current Outpatient Prescriptions  Medication Sig Dispense Refill  . ALPRAZolam (XANAX) 1 MG tablet Take 1 tablet (1 mg total) by mouth 3 (three) times daily. TAKE (1) TABLET THREE TIMES DAILY. 90 tablet 2  . amLODipine (NORVASC) 5 MG tablet Take 1 tablet (5 mg total) by mouth daily. 90 tablet 1  . atorvastatin (LIPITOR)  40 MG tablet Take 1 tablet (40 mg total) by mouth daily. 90 tablet 1  . furosemide (LASIX) 20 MG tablet Take 1 tablet (20 mg total) by mouth daily. 90 tablet 1  . omeprazole (PRILOSEC) 20 MG capsule Take 1 capsule (20 mg total) by mouth daily. 90 capsule 1  . oxybutynin (DITROPAN) 5 MG tablet Take 5 mg by mouth every 4 (four) hours.     [name] Kitchen albuterol (PROVENTIL HFA;VENTOLIN HFA) 108 (90 BASE) MCG/ACT inhaler Inhale 2 puffs into the lungs.    [name] Kitchen albuterol (PROVENTIL) (2.5 MG/3ML) 0.083% nebulizer solution 2.5 mg.    . ipratropium-albuterol (DUONEB) 0.5-2.5 (3) MG/3ML SOLN 4 (four) times a day as needed.     No current facility-administered medications for this visit.    Allergies:   Lyrica; Penicillins; and Lisinopril    Social History:  The patient  reports that she has never smoked. She does not have any smokeless tobacco history on file. She reports that she does not drink alcohol or use illicit drugs.   Family History:  The patient's family history includes Arthritis in her brother and mother; Cancer in her brother; Diabetes in her father and mother; Heart disease in her father; Hepatitis C in her sister.    ROS:  Please see the history of present illness.   Otherwise, review of systems are positive for  chronic back pain, headaches, occasional dizziness, tinnitus, diarrhea, constipation, reflux, leg swelling and cramping..   All other systems are reviewed and negative.    PHYSICAL EXAM: VS:  BP 110/78 mmHg  Pulse 79  Ht 5\' 2"  (1.575 m)  Wt 180 lb (81.647 kg)  BMI 32.91 kg/m2 , BMI Body mass index is 32.91 kg/(m^2). GENERAL:  Well appearing HEENT:  Pupils equal round and reactive, fundi not visualized, oral mucosa unremarkable NECK:  No jugular venous distention, waveform within normal limits, carotid upstroke brisk and symmetric, no bruits, no thyromegaly LYMPHATICS:  No cervical, inguinal adenopathy LUNGS:  Clear to auscultation bilaterally BACK:  No CVA tenderness CHEST:   Unremarkable HEART:  PMI not displaced or sustained,S1 and S2 within normal limits, no S3, no S4, no clicks, no rubs, no murmurs ABD:  Flat, positive bowel sounds normal in frequency in pitch, no bruits, no rebound, no guarding, no midline pulsatile mass, no hepatomegaly, no splenomegaly EXT:  2 plus pulses throughout, no edema, no cyanosis no clubbing SKIN:  No rashes no nodules NEURO:  Cranial nerves II through XII grossly intact, motor grossly intact throughout PSYCH:  Cognitively intact, oriented to person place and time    EKG:  EKG is ordered today. The ekg ordered today demonstrates sinus rhythm, rate 79, axis within normal limits, intervals within normal limits, poor anterior R wave progression, nonspecific diffuse T wave flattening and T-wave inversion, could not exclude ischemia but no old EKGs for comparison.   Recent Labs: 06/03/2015: ALT 9; BUN 14; Creatinine, Ser 0.87; Hemoglobin 13.3; Potassium 4.6; Sodium 140; TSH 1.580    Lipid Panel    Component Value Date/Time   CHOL 238* 06/03/2015 1210   TRIG 103 06/03/2015 1210   HDL 54 06/03/2015 1210   CHOLHDL 4.4 06/03/2015 1210   LDLCALC 163* 06/03/2015 1210      Wt Readings from Last 3 Encounters:  07/06/15 180 lb (81.647 kg)  06/03/15 180 lb 12.8 oz (82.01 kg)  05/04/15 182 lb 6.4 oz (82.736 kg)      Other studies Reviewed: Additional studies/ records that were reviewed today include: None. Review of the above records demonstrates:  Please see elsewhere in the note.     ASSESSMENT AND PLAN:  CHEST PAIN:  Her pain has atypical greater than typical features. Is not completely reproducible does not have an accelerated pattern. There is an abnormal EKG but no old one for comparison.  I think the pretest probability of obstructive coronary disease is moderate. I do not think that invasive evaluation is indicated. She will needs stress testing however. She would not be a walker treadmill. Therefore, she will have a  [employer].  She will continue on aspirin. She should avoid emotional stress. With her low blood pressure I won't titrate medication breath beta blockers at this point. She understands presents to the emergency room should she have worsening symptoms. Further evaluation will be based on the results of this test.  HTN:  The blood pressure is at target. No change in medications is indicated. We will continue with therapeutic lifestyle changes (TLC).;  DYSLIPIDEMIA:    Her LDL is 163 calculated. However, this is being managed by Yvonne Fraise, MD .  I will defer to their management.   Current medicines are reviewed at length with the patient today.  The patient does not have concerns regarding medicines.  The following changes have been made:  no change  Labs/ tests ordered today include:   Orders Placed This  Encounter  Procedures  . Myocardial Perfusion Imaging  . EKG 12-Lead     Disposition:   FU with me as needed.     Signed, Yvonne Breeding, MD  07/06/2015 10:44 AM    [facility]

## 2015-07-07 ENCOUNTER — Telehealth (HOSPITAL_COMMUNITY): Payer: Self-pay

## 2015-07-07 NOTE — Telephone Encounter (Signed)
Left message on voicemail in reference to upcoming appointment scheduled for 07-12-2015. Phone number given for a call back so details instructions can be given. Oletta Lamas, Lakaisha Danish A

## 2015-07-12 ENCOUNTER — Ambulatory Visit (HOSPITAL_COMMUNITY): Payer: Commercial Managed Care - HMO | Attending: Cardiology

## 2015-07-12 DIAGNOSIS — R072 Precordial pain: Secondary | ICD-10-CM

## 2015-07-12 LAB — MYOCARDIAL PERFUSION IMAGING
CHL CUP NUCLEAR SRS: 0
CHL CUP RESTING HR STRESS: 62 {beats}/min
LV sys vol: 20 mL
LVDIAVOL: 74 mL
Peak HR: 87 {beats}/min
RATE: 0.33
SDS: 6
SSS: 6
TID: 0.99

## 2015-07-12 MED ORDER — TECHNETIUM TC 99M SESTAMIBI GENERIC - CARDIOLITE
11.0000 | Freq: Once | INTRAVENOUS | Status: AC | PRN
  Administered 2015-07-12: 11 via INTRAVENOUS

## 2015-07-12 MED ORDER — TECHNETIUM TC 99M SESTAMIBI GENERIC - CARDIOLITE
30.7000 | Freq: Once | INTRAVENOUS | Status: AC | PRN
  Administered 2015-07-12: 30.7 via INTRAVENOUS

## 2015-07-12 MED ORDER — REGADENOSON 0.4 MG/5ML IV SOLN
0.4000 mg | Freq: Once | INTRAVENOUS | Status: AC
  Administered 2015-07-12: 0.4 mg via INTRAVENOUS

## 2015-07-13 ENCOUNTER — Telehealth (INDEPENDENT_AMBULATORY_CARE_PROVIDER_SITE_OTHER): Payer: Self-pay | Admitting: *Deleted

## 2015-07-13 ENCOUNTER — Other Ambulatory Visit (INDEPENDENT_AMBULATORY_CARE_PROVIDER_SITE_OTHER): Payer: Self-pay | Admitting: *Deleted

## 2015-07-13 ENCOUNTER — Ambulatory Visit (INDEPENDENT_AMBULATORY_CARE_PROVIDER_SITE_OTHER): Payer: Commercial Managed Care - HMO | Admitting: Internal Medicine

## 2015-07-13 ENCOUNTER — Encounter (INDEPENDENT_AMBULATORY_CARE_PROVIDER_SITE_OTHER): Payer: Self-pay | Admitting: Internal Medicine

## 2015-07-13 VITALS — BP 128/74 | HR 66 | Temp 97.9°F | Ht 62.0 in | Wt 180.4 lb

## 2015-07-13 DIAGNOSIS — K589 Irritable bowel syndrome without diarrhea: Secondary | ICD-10-CM | POA: Diagnosis not present

## 2015-07-13 DIAGNOSIS — Z1211 Encounter for screening for malignant neoplasm of colon: Secondary | ICD-10-CM

## 2015-07-13 MED ORDER — HYOSCYAMINE SULFATE 0.125 MG SL SUBL: 0.1250 mg | SUBLINGUAL_TABLET | SUBLINGUAL | Status: DC | PRN

## 2015-07-13 NOTE — Patient Instructions (Addendum)
Screening colonoscopy. The risks and benefits such as perforation, bleeding, and infection were reviewed with the patient and is agreeable. Rx for Levsin sent to her pharmacy.

## 2015-07-13 NOTE — Progress Notes (Signed)
Subjective:    Patient ID: Yvonne [name], female    DOB: [birth year], 64 y.o.   MRN: [id number]  HPI Referred to our office by Dr. Quinn Axe for GERD. She takes Omeprazole for the GERD. She tells me she rarely has acid reflux with the Omeprzole She c/o irritable bowel symptoms. If she has nausea, she will have cramps in her abdomen. After she has a BM, the symptoms resolve. Symptoms x 3-4 times a week. Her stools are not formed when she has IBS symptoms. Symptoms occur after she eats. Stools will be loose. She usually has about 3 stools a day. She tells me she alternates between constipation and diarrhea.  She has not seen any BRRB or melena. She has a hx of IBS for years. No new symptoms. Appetite for the most part is good. There has been no weight loss.  Her last colonoscopy was greater than 10 yrs. She reports it was normal.  She does not remember where she had the colonoscopy.  Her insurance company sent her a letter stating she probably needed a colonoscopy.    Review of Systems Past Medical History  Diagnosis Date  . Overactive bladder   . Hypertension   . Asthma   . Chronic bronchitis   . Anxiety   . Chronic leg pain   . Hyperlipidemia     Past Surgical History  Procedure Laterality Date  . Abdominal hysterectomy    . Cholecystectomy      Allergies  Allergen Reactions  . Lyrica [Pregabalin]   . Penicillins Hives  . Lisinopril Cough    Current Outpatient Prescriptions on File Prior to Visit  Medication Sig Dispense Refill  . albuterol (PROVENTIL HFA;VENTOLIN HFA) 108 (90 BASE) MCG/ACT inhaler Inhale 2 puffs into the lungs.    [name] Kitchen albuterol (PROVENTIL) (2.5 MG/3ML) 0.083% nebulizer solution 2.5 mg.    . ALPRAZolam (XANAX) 1 MG tablet Take 1 tablet (1 mg total) by mouth 3 (three) times daily. TAKE (1) TABLET THREE TIMES DAILY. 90 tablet 2  . amLODipine (NORVASC) 5 MG tablet Take 1 tablet (5 mg total) by mouth daily. 90 tablet 1  . atorvastatin (LIPITOR) 40 MG tablet Take 1  tablet (40 mg total) by mouth daily. 90 tablet 1  . furosemide (LASIX) 20 MG tablet Take 1 tablet (20 mg total) by mouth daily. 90 tablet 1  . ipratropium-albuterol (DUONEB) 0.5-2.5 (3) MG/3ML SOLN 4 (four) times a day as needed.    [name] Kitchen omeprazole (PRILOSEC) 20 MG capsule Take 1 capsule (20 mg total) by mouth daily. 90 capsule 1  . oxybutynin (DITROPAN) 5 MG tablet Take 5 mg by mouth every 4 (four) hours.      No current facility-administered medications on file prior to visit.        Objective:   Physical Exam Blood pressure 128/74, pulse 66, temperature 97.9 F (36.6 C), height 5\' 2"  (1.575 m), weight 180 lb 6.4 oz (81.829 kg). Alert and oriented. Skin warm and dry. Oral mucosa is moist.   . Sclera anicteric, conjunctivae is pink. Thyroid not enlarged. No cervical lymphadenopathy. Lungs clear. Heart regular rate and rhythm.  Abdomen is soft. Bowel sounds are positive. No hepatomegaly. No abdominal masses felt. No tenderness.  No edema to lower extremities.          Assessment & Plan:  Screening colonoscopy.  The risks and benefits such as perforation, bleeding, and infection were reviewed with the patient and is agreeable. Am going to try her Levsin  as needed every 4 hrs to see if it will help.

## 2015-07-13 NOTE — Telephone Encounter (Signed)
Patient needs trilyte 

## 2015-07-14 ENCOUNTER — Telehealth: Payer: Self-pay | Admitting: Cardiology

## 2015-07-14 NOTE — Telephone Encounter (Signed)
Returned call to patient Yvonne [name] results given.Copy sent to Dr.Donald Laurance Flatten.

## 2015-07-14 NOTE — Telephone Encounter (Signed)
New message ° ° ° ° °Pt want stress test results. °

## 2015-07-15 DIAGNOSIS — Z5181 Encounter for therapeutic drug level monitoring: Secondary | ICD-10-CM | POA: Diagnosis not present

## 2015-07-15 DIAGNOSIS — Z79899 Other long term (current) drug therapy: Secondary | ICD-10-CM | POA: Diagnosis not present

## 2015-07-15 MED ORDER — PEG 3350-KCL-NA BICARB-NACL 420 G PO SOLR: 4000.0000 mL | Freq: Once | ORAL | Status: DC

## 2015-07-20 ENCOUNTER — Encounter: Payer: Self-pay | Admitting: Pharmacist

## 2015-07-20 ENCOUNTER — Ambulatory Visit (INDEPENDENT_AMBULATORY_CARE_PROVIDER_SITE_OTHER): Payer: Commercial Managed Care - HMO | Admitting: Pharmacist

## 2015-07-20 ENCOUNTER — Ambulatory Visit (INDEPENDENT_AMBULATORY_CARE_PROVIDER_SITE_OTHER): Payer: Commercial Managed Care - HMO

## 2015-07-20 ENCOUNTER — Other Ambulatory Visit: Payer: Self-pay | Admitting: Family Medicine

## 2015-07-20 VITALS — BP 136/76 | HR 74 | Ht 62.0 in | Wt 180.0 lb

## 2015-07-20 DIAGNOSIS — Z0000 Encounter for general adult medical examination without abnormal findings: Secondary | ICD-10-CM

## 2015-07-20 DIAGNOSIS — M8580 Other specified disorders of bone density and structure, unspecified site: Secondary | ICD-10-CM | POA: Insufficient documentation

## 2015-07-20 DIAGNOSIS — Z780 Asymptomatic menopausal state: Secondary | ICD-10-CM

## 2015-07-20 DIAGNOSIS — Z139 Encounter for screening, unspecified: Secondary | ICD-10-CM

## 2015-07-20 DIAGNOSIS — E669 Obesity, unspecified: Secondary | ICD-10-CM | POA: Insufficient documentation

## 2015-07-20 DIAGNOSIS — E8881 Metabolic syndrome: Secondary | ICD-10-CM | POA: Insufficient documentation

## 2015-07-20 DIAGNOSIS — E119 Type 2 diabetes mellitus without complications: Secondary | ICD-10-CM | POA: Insufficient documentation

## 2015-07-20 DIAGNOSIS — R7309 Other abnormal glucose: Secondary | ICD-10-CM

## 2015-07-20 LAB — POCT GLYCOSYLATED HEMOGLOBIN (HGB A1C): Hemoglobin A1C: 6.8

## 2015-07-20 MED ORDER — CALCIUM CITRATE-VITAMIN D 315-200 MG-UNIT PO TABS: 2.0000 | ORAL_TABLET | Freq: Every day | ORAL | Status: DC

## 2015-07-20 MED ORDER — METFORMIN HCL ER 500 MG PO TB24: 500.0000 mg | ORAL_TABLET | Freq: Every day | ORAL | Status: DC

## 2015-07-20 NOTE — Patient Instructions (Addendum)
Ms. Easterwood , Thank you for taking time to come for your Medicare Wellness Visit. I appreciate your ongoing commitment to your health goals. Please review the following plan we discussed and let me know if I can assist you in the future.   These are the goals we discussed: Goals    . Exercise 3 to 4 times per week (30 min per time)    . limit sugar     Cut out drinks / beverages with sugar.         This is a list of the screening recommended for you and due dates:  Health Maintenance  Topic Date Due  .  Hepatitis C: One time screening is recommended by Center for Disease Control  (CDC) for  adults born from 1945 through 1965.   08/27/1951  . Flu Shot  06/27/2015  . DEXA scan (bone density measurement)  07/19/2017  . Colon Cancer Screening  scheduled  . Shingles Vaccine  08/04/2015 - verified cost $47  . Tetanus Vaccine  08/04/2015 - verified cost $62.18  . HIV Screening  08/04/2015*  . Pneumonia vaccines (1 of 2 - PCV13) 08/04/2015 checking past records  . Mammogram  12/09/2015  *Topic was postponed. The date shown is not the original due date.   Fall Prevention and Home Safety Falls cause injuries and can affect all age groups. It is possible to use preventive measures to significantly decrease the likelihood of falls. There are many simple measures which can make your home safer and prevent falls. OUTDOORS  Repair cracks and edges of walkways and driveways.  Remove high doorway thresholds.  Trim shrubbery on the main path into your home.  Have good outside lighting.  Clear walkways of tools, rocks, debris, and clutter.  Check that handrails are not broken and are securely fastened. Both sides of steps should have handrails.  Have leaves, snow, and ice cleared regularly.  Use sand or salt on walkways during winter months.  In the garage, clean up grease or oil spills. BATHROOM  Install night lights.  Install grab bars by the toilet and in the tub and shower.  Use  non-skid mats or decals in the tub or shower.  Place a plastic non-slip stool in the shower to sit on, if needed.  Keep floors dry and clean up all water on the floor immediately.  Remove soap buildup in the tub or shower on a regular basis.  Secure bath mats with non-slip, double-sided rug tape.  Remove throw rugs and tripping hazards from the floors. BEDROOMS  Install night lights.  Make sure a bedside light is easy to reach.  Do not use oversized bedding.  Keep a telephone by your bedside.  Have a firm chair with side arms to use for getting dressed.  Remove throw rugs and tripping hazards from the floor. KITCHEN  Keep handles on pots and pans turned toward the center of the stove. Use back burners when possible.  Clean up spills quickly and allow time for drying.  Avoid walking on wet floors.  Avoid hot utensils and knives.  Position shelves so they are not too high or low.  Place commonly used objects within easy reach.  If necessary, use a sturdy step stool with a grab bar when reaching.  Keep electrical cables out of the way.  Do not use floor polish or wax that makes floors slippery. If you must use wax, use non-skid floor wax.  Remove throw rugs and tripping hazards from   the floor. STAIRWAYS  Never leave objects on stairs.  Place handrails on both sides of stairways and use them. Fix any loose handrails. Make sure handrails on both sides of the stairways are as long as the stairs.  Check carpeting to make sure it is firmly attached along stairs. Make repairs to worn or loose carpet promptly.  Avoid placing throw rugs at the top or bottom of stairways, or properly secure the rug with carpet tape to prevent slippage. Get rid of throw rugs, if possible.  Have an electrician put in a light switch at the top and bottom of the stairs. OTHER FALL PREVENTION TIPS  Wear low-heel or rubber-soled shoes that are supportive and fit well. Wear closed toe  shoes.  When using a stepladder, make sure it is fully opened and both spreaders are firmly locked. Do not climb a closed stepladder.  Add color or contrast paint or tape to grab bars and handrails in your home. Place contrasting color strips on first and last steps.  Learn and use mobility aids as needed. Install an electrical emergency response system.  Turn on lights to avoid dark areas. Replace light bulbs that burn out immediately. Get light switches that glow.  Arrange furniture to create clear pathways. Keep furniture in the same place.  Firmly attach carpet with non-skid or double-sided tape.  Eliminate uneven floor surfaces.  Select a carpet pattern that does not visually hide the edge of steps.  Be aware of all pets. OTHER HOME SAFETY TIPS  Set the water temperature for 120 F (48.8 C).  Keep emergency numbers on or near the telephone.  Keep smoke detectors on every level of the home and near sleeping areas. Document Released: 11/02/2002 Document Revised: 05/13/2012 Document Reviewed: 02/01/2012 [facility] Patient Information 2015 [place], [place]. This information is not intended to replace advice given to you by your health care provider. Make sure you discuss any questions you have with your health care provider.               Exercise for Strong Bones  Exercise is important to build and maintain strong bones / bone density.  There are 2 types of exercises that are important to building and maintaining strong bones:  Weight- bearing and muscle-stregthening.  Weight-bearing Exercises  These exercises include activities that make you move against gravity while staying upright. Weight-bearing exercises can be high-impact or low-impact.  High-impact weight-bearing exercises help build bones and keep them strong. If you have broken a bone due to osteoporosis or are at risk of breaking a bone, you may need to avoid high-impact exercises. If you're not sure, you should check  with your healthcare provider.  Examples of high-impact weight-bearing exercises are: Dancing  Doing high-impact aerobics  Hiking  Jogging/running  Jumping Rope  Stair climbing  Tennis  Low-impact weight-bearing exercises can also help keep bones strong and are a safe alternative if you cannot do high-impact exercises.   Examples of low-impact weight-bearing exercises are: Using elliptical training machines  Doing low-impact aerobics  Using stair-step machines  Fast walking on a treadmill or outside   Muscle-Strengthening Exercises These exercises include activities where you move your body, a weight or some other resistance against gravity. They are also known as resistance exercises and include: Lifting weights  Using elastic exercise bands  Using weight machines  Lifting your own body weight  Functional movements, such as standing and rising up on your toes  Yoga and Pilates can also improve strength,  balance and flexibility. However, certain positions may not be safe for people with osteoporosis or those at increased risk of broken bones. For example, exercises that have you bend forward may increase the chance of breaking a bone in the spine.   Non-Impact Exercises There are other types of exercises that can help prevent falls.  Non-impact exercises can help you to improve balance, posture and how well you move in everyday activities. Some of these exercises include: Balance exercises that strengthen your legs and test your balance, such as Tai Chi, can decrease your risk of falls.  Posture exercises that improve your posture and reduce rounded or "sloping" shoulders can help you decrease the chance of breaking a bone, especially in the spine.  Functional exercises that improve how well you move can help you with everyday activities and decrease your chance of falling and breaking a bone. For example, if you have trouble getting up from a chair or climbing stairs, you should do  these activities as exercises.   **A physical therapist can teach you balance, posture and functional exercises. He/she can also help you learn which exercises are safe and appropriate for you.  [facility] has a physical therapy office in [place] in front of our office and referrals can be made for assessments and treatment as needed and strength and balance training.  If you would like to have an assessment with Chad and our physical therapy team please let a nurse or provider know.     
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
## 2015-07-21 LAB — HEPATITIS C ANTIBODY

## 2015-07-28 ENCOUNTER — Telehealth: Payer: Self-pay | Admitting: Family Medicine

## 2015-07-29 NOTE — Telephone Encounter (Signed)
Pt aware referral placed yesterday and approved and given to  Norman

## 2015-08-01 DIAGNOSIS — M5136 Other intervertebral disc degeneration, lumbar region: Secondary | ICD-10-CM | POA: Diagnosis not present

## 2015-08-01 DIAGNOSIS — M47816 Spondylosis without myelopathy or radiculopathy, lumbar region: Secondary | ICD-10-CM | POA: Diagnosis not present

## 2015-08-01 DIAGNOSIS — M5126 Other intervertebral disc displacement, lumbar region: Secondary | ICD-10-CM | POA: Diagnosis not present

## 2015-08-03 ENCOUNTER — Other Ambulatory Visit: Payer: Self-pay | Admitting: Family Medicine

## 2015-08-03 ENCOUNTER — Telehealth: Payer: Self-pay | Admitting: Family Medicine

## 2015-08-03 NOTE — Telephone Encounter (Signed)
Last filled 07/04/15, last seen 06/03/15. Call in at [facility]

## 2015-08-03 NOTE — Telephone Encounter (Signed)
Pt. Will need an appointment

## 2015-08-03 NOTE — Telephone Encounter (Signed)
appt scheduled

## 2015-08-04 ENCOUNTER — Ambulatory Visit: Payer: Commercial Managed Care - HMO | Admitting: Family Medicine

## 2015-08-04 ENCOUNTER — Encounter: Payer: Self-pay | Admitting: Family Medicine

## 2015-08-04 ENCOUNTER — Ambulatory Visit (INDEPENDENT_AMBULATORY_CARE_PROVIDER_SITE_OTHER): Payer: Commercial Managed Care - HMO | Admitting: Family Medicine

## 2015-08-04 VITALS — BP 149/78 | HR 77 | Temp 97.9°F | Ht 62.0 in | Wt 182.0 lb

## 2015-08-04 DIAGNOSIS — E119 Type 2 diabetes mellitus without complications: Secondary | ICD-10-CM | POA: Diagnosis not present

## 2015-08-04 DIAGNOSIS — M25561 Pain in right knee: Secondary | ICD-10-CM

## 2015-08-04 DIAGNOSIS — F419 Anxiety disorder, unspecified: Secondary | ICD-10-CM | POA: Diagnosis not present

## 2015-08-04 MED ORDER — ALPRAZOLAM 1 MG PO TABS: 1.0000 mg | ORAL_TABLET | Freq: Three times a day (TID) | ORAL | Status: DC

## 2015-08-04 MED ORDER — VENLAFAXINE HCL ER 37.5 MG PO CP24: 37.5000 mg | ORAL_CAPSULE | Freq: Every day | ORAL | Status: DC

## 2015-08-04 NOTE — Progress Notes (Addendum)
Subjective:  Patient ID: Yvonne [name], female    DOB: 1951-09-20  Age: 64 y.o. MRN: [id number]  CC: Anxiety   HPI Yvonne [name] presents for Anxiet y circumstances - Mom stroke, Mom in law had to start dialysis Gets upset anxious breathes too fast.Upset anxious, sad - occuring every day. Bad, sharp chest pain most intense ever lasting 2 seconds 2 days ago. Then had severe pains in bottom of right foot. Then right right leg was sore. Still sore Recent nml stress test. History Yvonne [name] has a past medical history of Overactive bladder; Hypertension; Asthma; Chronic bronchitis; Anxiety; Chronic leg pain; Hyperlipidemia; Clavicle fracture; and Cataract.   She has past surgical history that includes Abdominal hysterectomy; Cholecystectomy; and tear duct surgery (Bilateral).   Her family history includes Arthritis in her brother and mother; Cancer in her brother; Diabetes in her father and mother; Heart disease in her father; Hepatitis C in her sister.She reports that she has never smoked. She has never used smokeless tobacco. She reports that she does not drink alcohol or use illicit drugs.  Outpatient Prescriptions Prior to Visit  Medication Sig Dispense Refill  . albuterol (PROVENTIL HFA;VENTOLIN HFA) 108 (90 BASE) MCG/ACT inhaler Inhale 2 puffs into the lungs.    [name] Kitchen albuterol (PROVENTIL) (2.5 MG/3ML) 0.083% nebulizer solution 2.5 mg.    . amLODipine (NORVASC) 5 MG tablet Take 1 tablet (5 mg total) by mouth daily. 90 tablet 1  . atorvastatin (LIPITOR) 40 MG tablet Take 1 tablet (40 mg total) by mouth daily. 90 tablet 1  . calcium citrate-vitamin D (CALCIUM CITRATE + D) 315-200 MG-UNIT per tablet Take 2 tablets by mouth daily.    . furosemide (LASIX) 20 MG tablet Take 1 tablet (20 mg total) by mouth daily. 90 tablet 1  . HYDROcodone-acetaminophen (NORCO) 10-325 MG per tablet Take 1 tablet by mouth every 6 (six) hours as needed.    . hyoscyamine (LEVSIN SL) 0.125 MG SL tablet Place 1 tablet (0.125  mg total) under the tongue every 4 (four) hours as needed. 30 tablet 0  . ipratropium-albuterol (DUONEB) 0.5-2.5 (3) MG/3ML SOLN 4 (four) times a day as needed.    . metFORMIN (GLUCOPHAGE-XR) 500 MG 24 hr tablet Take 1 tablet (500 mg total) by mouth daily with breakfast. 30 tablet 3  . omeprazole (PRILOSEC) 20 MG capsule Take 1 capsule (20 mg total) by mouth daily. 90 capsule 1  . oxybutynin (DITROPAN) 5 MG tablet Take 5 mg by mouth every 4 (four) hours.     . polyethylene glycol-electrolytes (NULYTELY/GOLYTELY) 420 G solution Take 4,000 mLs by mouth once. 4000 mL 0  . ALPRAZolam (XANAX) 1 MG tablet Take 1 tablet (1 mg total) by mouth 3 (three) times daily. TAKE (1) TABLET THREE TIMES DAILY. 90 tablet 2  . venlafaxine XR (EFFEXOR XR) 37.5 MG 24 hr capsule Take 37.5 mg by mouth at bedtime.     No facility-administered medications prior to visit.    ROS Review of Systems  Constitutional: Negative for fever, chills, diaphoresis, appetite change, fatigue and unexpected weight change.  HENT: Negative for congestion, ear pain, hearing loss, postnasal drip, rhinorrhea, sneezing, sore throat and trouble swallowing.   Eyes: Negative for pain.  Respiratory: Negative for cough, chest tightness and shortness of breath.   Cardiovascular: Negative for chest pain and palpitations.  Gastrointestinal: Negative for nausea, vomiting, abdominal pain, diarrhea and constipation.  Genitourinary: Negative for dysuria, frequency and menstrual problem.  Musculoskeletal: Negative for joint swelling and arthralgias.  Skin: Negative for rash.  Neurological: Negative for dizziness, weakness, numbness and headaches.  Psychiatric/Behavioral: Positive for confusion and agitation. Negative for dysphoric mood. The patient is nervous/anxious.     Objective:  BP 149/78 mmHg  Pulse 77  Temp(Src) 97.9 F (36.6 C) (Oral)  Ht 5\' 2"  (1.575 m)  Wt 182 lb (82.555 kg)  BMI 33.28 kg/m2  BP Readings from Last 3 Encounters:    08/04/15 149/78  07/20/15 136/76  07/13/15 128/74    Wt Readings from Last 3 Encounters:  08/04/15 182 lb (82.555 kg)  07/20/15 180 lb (81.647 kg)  07/13/15 180 lb 6.4 oz (81.829 kg)     Physical Exam  Constitutional: She is oriented to person, place, and time. She appears well-developed and well-nourished. No distress.  HENT:  Head: Normocephalic and atraumatic.  Right Ear: External ear normal.  Left Ear: External ear normal.  Nose: Nose normal.  Mouth/Throat: Oropharynx is clear and moist.  Eyes: Conjunctivae and EOM are normal. Pupils are equal, round, and reactive to light.  Neck: Normal range of motion. Neck supple. No thyromegaly present.  Cardiovascular: Normal rate, regular rhythm and normal heart sounds.   No murmur heard. Pulmonary/Chest: Effort normal and breath sounds normal. No respiratory distress. She has no wheezes. She has no rales.  Abdominal: Soft. Bowel sounds are normal. She exhibits no distension. There is no tenderness.  Lymphadenopathy:    She has no cervical adenopathy.  Neurological: She is alert and oriented to person, place, and time. She has normal reflexes.  Skin: Skin is warm and dry.  Psychiatric: Her behavior is normal. Judgment and thought content normal.    Lab Results  Component Value Date   HGBA1C 6.8 07/20/2015    Lab Results  Component Value Date   WBC 6.0 06/03/2015   HGB 13.3 06/03/2015   HCT 40.2 06/03/2015   GLUCOSE 102* 06/03/2015   CHOL 238* 06/03/2015   TRIG 103 06/03/2015   HDL 54 06/03/2015   LDLCALC 163* 06/03/2015   ALT 9 06/03/2015   AST 15 06/03/2015   NA 140 06/03/2015   K 4.6 06/03/2015   CL 103 06/03/2015   CREATININE 0.87 06/03/2015   BUN 14 06/03/2015   CO2 24 06/03/2015   TSH 1.580 06/03/2015   HGBA1C 6.8 07/20/2015    Mm Digital Screening Bilateral  12/08/2014   CLINICAL DATA:  Screening.  EXAM: DIGITAL SCREENING BILATERAL MAMMOGRAM WITH CAD  COMPARISON:  Previous exam(s).  ACR Breast Density  Category b: There are scattered areas of fibroglandular density.  FINDINGS: There are no findings suspicious for malignancy. Images were processed with CAD.  IMPRESSION: No mammographic evidence of malignancy. A result letter of this screening mammogram will be mailed directly to the patient.  RECOMMENDATION: Screening mammogram in one year. (Code:SM-B-01Y)  BI-RADS CATEGORY  1: Negative.   Electronically Signed   By: Enrique Sack M.D.   On: 12/08/2014 15:37    Assessment & Plan:   Yvonne [name] was seen today for anxiety.  Diagnoses and all orders for this visit:  Type 2 diabetes mellitus without complication  Anxiety  Pain in joint, lower leg, right  Other orders -     venlafaxine XR (EFFEXOR XR) 37.5 MG 24 hr capsule; Take 1 capsule (37.5 mg total) by mouth at bedtime. -     ALPRAZolam (XANAX) 1 MG tablet; Take 1 tablet (1 mg total) by mouth 3 (three) times daily. TAKE (1) TABLET THREE TIMES DAILY.   I have changed Yvonne [name] venlafaxine XR. I am also having her maintain her albuterol, albuterol, ipratropium-albuterol, amLODipine, atorvastatin, furosemide, omeprazole, oxybutynin, hyoscyamine, polyethylene glycol-electrolytes, HYDROcodone-acetaminophen, calcium citrate-vitamin D, metFORMIN, and ALPRAZolam.  Meds ordered this encounter  Medications  . venlafaxine XR (EFFEXOR XR) 37.5 MG 24 hr capsule    Sig: Take 1 capsule (37.5 mg total) by mouth at bedtime.    Dispense:  30 capsule    Refill:  1  . ALPRAZolam (XANAX) 1 MG tablet    Sig: Take 1 tablet (1 mg total) by mouth 3 (three) times daily. TAKE (1) TABLET THREE TIMES DAILY.    Dispense:  90 tablet    Refill:  2     Follow-up: Return in about 3 months (around 11/03/2015) for hypertension, Depression, diabetes.  [name], M.D.

## 2015-08-04 NOTE — Telephone Encounter (Signed)
Per Yvonne [name] patient rescheduled appointment for her original appointment time and date.

## 2015-08-10 DIAGNOSIS — H35342 Macular cyst, hole, or pseudohole, left eye: Secondary | ICD-10-CM | POA: Diagnosis not present

## 2015-08-10 DIAGNOSIS — H35341 Macular cyst, hole, or pseudohole, right eye: Secondary | ICD-10-CM | POA: Diagnosis not present

## 2015-08-15 DIAGNOSIS — G894 Chronic pain syndrome: Secondary | ICD-10-CM | POA: Diagnosis not present

## 2015-08-15 DIAGNOSIS — M79671 Pain in right foot: Secondary | ICD-10-CM | POA: Diagnosis not present

## 2015-08-15 DIAGNOSIS — M5442 Lumbago with sciatica, left side: Secondary | ICD-10-CM | POA: Diagnosis not present

## 2015-08-15 DIAGNOSIS — M5441 Lumbago with sciatica, right side: Secondary | ICD-10-CM | POA: Diagnosis not present

## 2015-08-16 ENCOUNTER — Ambulatory Visit: Payer: Commercial Managed Care - HMO | Admitting: Family Medicine

## 2015-08-17 ENCOUNTER — Encounter (HOSPITAL_COMMUNITY): Payer: Self-pay | Admitting: *Deleted

## 2015-08-17 ENCOUNTER — Ambulatory Visit (HOSPITAL_COMMUNITY)
Admission: RE | Admit: 2015-08-17 | Discharge: 2015-08-17 | Disposition: A | Discharge status: Home Health | Payer: Commercial Managed Care - HMO | Source: Ambulatory Visit | Attending: Internal Medicine | Admitting: Internal Medicine

## 2015-08-17 ENCOUNTER — Encounter (HOSPITAL_COMMUNITY)
Admission: RE | Disposition: A | Discharge status: Home Health | Payer: Self-pay | Source: Ambulatory Visit | Attending: Internal Medicine

## 2015-08-17 DIAGNOSIS — Z1211 Encounter for screening for malignant neoplasm of colon: Secondary | ICD-10-CM | POA: Diagnosis not present

## 2015-08-17 DIAGNOSIS — E785 Hyperlipidemia, unspecified: Secondary | ICD-10-CM | POA: Insufficient documentation

## 2015-08-17 DIAGNOSIS — F419 Anxiety disorder, unspecified: Secondary | ICD-10-CM | POA: Diagnosis not present

## 2015-08-17 DIAGNOSIS — I10 Essential (primary) hypertension: Secondary | ICD-10-CM | POA: Insufficient documentation

## 2015-08-17 DIAGNOSIS — Z79899 Other long term (current) drug therapy: Secondary | ICD-10-CM | POA: Insufficient documentation

## 2015-08-17 DIAGNOSIS — E119 Type 2 diabetes mellitus without complications: Secondary | ICD-10-CM | POA: Insufficient documentation

## 2015-08-17 DIAGNOSIS — D1779 Benign lipomatous neoplasm of other sites: Secondary | ICD-10-CM | POA: Insufficient documentation

## 2015-08-17 DIAGNOSIS — K5730 Diverticulosis of large intestine without perforation or abscess without bleeding: Secondary | ICD-10-CM | POA: Diagnosis not present

## 2015-08-17 DIAGNOSIS — D123 Benign neoplasm of transverse colon: Secondary | ICD-10-CM | POA: Diagnosis not present

## 2015-08-17 DIAGNOSIS — D175 Benign lipomatous neoplasm of intra-abdominal organs: Secondary | ICD-10-CM | POA: Diagnosis not present

## 2015-08-17 HISTORY — PX: COLONOSCOPY: SHX5424

## 2015-08-17 LAB — HM COLONOSCOPY

## 2015-08-17 LAB — GLUCOSE, CAPILLARY: GLUCOSE-CAPILLARY: 136 mg/dL — AB (ref 65–99)

## 2015-08-17 SURGERY — COLONOSCOPY
Anesthesia: Moderate Sedation

## 2015-08-17 MED ORDER — SODIUM CHLORIDE 0.9 % IV SOLN
INTRAVENOUS | Status: DC
  Administered 2015-08-17: 1000 mL via INTRAVENOUS

## 2015-08-17 MED ORDER — MIDAZOLAM HCL 5 MG/5ML IJ SOLN
INTRAMUSCULAR | Status: DC | PRN
  Administered 2015-08-17: 1 mg via INTRAVENOUS
  Administered 2015-08-17 (×2): 2 mg via INTRAVENOUS

## 2015-08-17 MED ORDER — STERILE WATER FOR IRRIGATION IR SOLN
Status: DC | PRN
  Administered 2015-08-17: 08:00:00

## 2015-08-17 MED ORDER — MIDAZOLAM HCL 5 MG/5ML IJ SOLN
INTRAMUSCULAR | Status: AC
  Filled 2015-08-17: qty 10

## 2015-08-17 MED ORDER — MEPERIDINE HCL 50 MG/ML IJ SOLN
INTRAMUSCULAR | Status: DC
Start: 2015-08-17 — End: 2015-08-17
  Filled 2015-08-17: qty 1

## 2015-08-17 MED ORDER — MEPERIDINE HCL 50 MG/ML IJ SOLN
INTRAMUSCULAR | Status: DC | PRN
  Administered 2015-08-17 (×2): 25 mg via INTRAVENOUS

## 2015-08-17 NOTE — Op Note (Signed)
COLONOSCOPY PROCEDURE REPORT  PATIENT:  Yvonne [name]  MR#:  [id number] Birthdate:  September 04, 1951, 64 y.o., female Endoscopist:  Dr. Rogene Houston, MD Referred By:  Dr. [name], MD  Procedure Date: 08/17/2015  Procedure:   Colonoscopy and snare polypectomy  Indications:  Patient is 64 year old Caucasian female was undergoing average risk screening colonoscopy.  Informed Consent:  The procedure and risks were reviewed with the patient and informed consent was obtained.  Medications:  Demerol 50 mg IV Versed 5 mg IV  Description of procedure:  After a digital rectal exam was performed, that colonoscope was advanced from the anus through the rectum and colon to the area of the cecum, ileocecal valve and appendiceal orifice. The cecum was deeply intubated. These structures were well-seen and photographed for the record. From the level of the cecum and ileocecal valve, the scope was slowly and cautiously withdrawn. The mucosal surfaces were carefully surveyed utilizing scope tip to flexion to facilitate fold flattening as needed. The scope was pulled down into the rectum where a thorough exam including retroflexion was performed.  Findings:   Prep satisfactory. Small submucosal lipoma at ascending colon. 6 mm polyp hot snare from splenic flexure. Few scattered diverticula at sigmoid colon. Normal examination rectum and anorectal junction.   Therapeutic/Diagnostic Maneuvers Performed:  See above  Complications:  None  EBL: None  Cecal Withdrawal Time:  12 minutes  Impression:  Examination performed to cecum. Incidental finding of small submucosal lipoma at ascending colon. 6 mm polyp hot snared from splenic flexure. Mild sigmoid colon diverticulosis.  Recommendations:  Standard instructions given. No aspirin or NSAIDs for 1 week. High fiber diet. I will contact patient with biopsy results and further recommendations.  REHMAN,NAJEEB U  08/17/2015 8:10 AM  CC: Dr.  [name], MD & Dr. Rayne Du ref. Derrin Currey found

## 2015-08-17 NOTE — Discharge Instructions (Signed)
No aspirin or NSAIDs for 1 week  Resume usual medications and high fiber diet. No driving for 24 hours. Physician will call with biopsy results.  Colonoscopy, Care After Refer to this sheet in the next few weeks. These instructions provide you with information on caring for yourself after your procedure. Your health care provider may also give you more specific instructions. Your treatment has been planned according to current medical practices, but problems sometimes occur. Call your health care provider if you have any problems or questions after your procedure. WHAT TO EXPECT AFTER THE PROCEDURE  After your procedure, it is typical to have the following:  A small amount of blood in your stool.  Moderate amounts of gas and mild abdominal cramping or bloating. HOME CARE INSTRUCTIONS  Do not drive, operate machinery, or sign important documents for 24 hours.  You may shower and resume your regular physical activities, but move at a slower pace for the first 24 hours.  Take frequent rest periods for the first 24 hours.  Walk around or put a warm pack on your abdomen to help reduce abdominal cramping and bloating.  Drink enough fluids to keep your urine clear or pale yellow.  You may resume your normal diet as instructed by your health care provider. Avoid heavy or fried foods that are hard to digest.  Avoid drinking alcohol for 24 hours or as instructed by your health care provider.  Only take over-the-counter or prescription medicines as directed by your health care provider.  If a tissue sample (biopsy) was taken during your procedure:  Do not take aspirin or blood thinners for 7 days, or as instructed by your health care provider.  Do not drink alcohol for 7 days, or as instructed by your health care provider.  Eat soft foods for the first 24 hours. SEEK MEDICAL CARE IF: You have persistent spotting of blood in your stool 2-3 days after the procedure. SEEK IMMEDIATE MEDICAL  CARE IF:  You have more than a small spotting of blood in your stool.  You pass large blood clots in your stool.  Your abdomen is swollen (distended).  You have nausea or vomiting.  You have a fever.  You have increasing abdominal pain that is not relieved with medicine.  Colon Polyps Polyps are lumps of extra tissue growing inside the body. Polyps can grow in the large intestine (colon). Most colon polyps are noncancerous (benign). However, some colon polyps can become cancerous over time. Polyps that are larger than a pea may be harmful. To be safe, caregivers remove and test all polyps. CAUSES  Polyps form when mutations in the genes cause your cells to grow and divide even though no more tissue is needed. RISK FACTORS There are a number of risk factors that can increase your chances of getting colon polyps. They include:  Being older than 50 years.  Family history of colon polyps or colon cancer.  Long-term colon diseases, such as colitis or Crohn disease.  Being overweight.  Smoking.  Being inactive.  Drinking too much alcohol. SYMPTOMS  Most small polyps do not cause symptoms. If symptoms are present, they may include:  Blood in the stool. The stool may look dark red or black.  Constipation or diarrhea that lasts longer than 1 week. DIAGNOSIS People often do not know they have polyps until their caregiver finds them during a regular checkup. Your caregiver can use 4 tests to check for polyps:  Digital rectal exam. The caregiver wears gloves  and feels inside the rectum. This test would find polyps only in the rectum.  Barium enema. The caregiver puts a liquid called barium into your rectum before taking X-rays of your colon. Barium makes your colon look white. Polyps are dark, so they are easy to see in the X-ray pictures.  Sigmoidoscopy. A thin, flexible tube (sigmoidoscope) is placed into your rectum. The sigmoidoscope has a light and tiny camera in it. The  caregiver uses the sigmoidoscope to look at the last third of your colon.  Colonoscopy. This test is like sigmoidoscopy, but the caregiver looks at the entire colon. This is the most common method for finding and removing polyps. TREATMENT  Any polyps will be removed during a sigmoidoscopy or colonoscopy. The polyps are then tested for cancer. PREVENTION  To help lower your risk of getting more colon polyps:  Eat plenty of fruits and vegetables. Avoid eating fatty foods.  Do not smoke.  Avoid drinking alcohol.  Exercise every day.  Lose weight if recommended by your caregiver.  Eat plenty of calcium and folate. Foods that are rich in calcium include milk, cheese, and broccoli. Foods that are rich in folate include chickpeas, kidney beans, and spinach. HOME CARE INSTRUCTIONS Keep all follow-up appointments as directed by your caregiver. You may need periodic exams to check for polyps. SEEK MEDICAL CARE IF: You notice bleeding during a bowel movement.  Diverticulosis Diverticulosis is the condition that develops when small pouches (diverticula) form in the wall of your colon. Your colon, or large intestine, is where water is absorbed and stool is formed. The pouches form when the inside layer of your colon pushes through weak spots in the outer layers of your colon. CAUSES  No one knows exactly what causes diverticulosis. RISK FACTORS  Being older than 33. Your risk for this condition increases with age. Diverticulosis is rare in people younger than 40 years. By age 76, almost everyone has it.  Eating a low-fiber diet.  Being frequently constipated.  Being overweight.  Not getting enough exercise.  Smoking.  Taking over-the-counter pain medicines, like aspirin and ibuprofen. SYMPTOMS  Most people with diverticulosis do not have symptoms. DIAGNOSIS  Because diverticulosis often has no symptoms, health care providers often discover the condition during an exam for other  colon problems. In many cases, a health care provider will diagnose diverticulosis while using a flexible scope to examine the colon (colonoscopy). TREATMENT  If you have never developed an infection related to diverticulosis, you may not need treatment. If you have had an infection before, treatment may include:  Eating more fruits, vegetables, and grains.  Taking a fiber supplement.  Taking a live bacteria supplement (probiotic).  Taking medicine to relax your colon. HOME CARE INSTRUCTIONS   Drink at least 6-8 glasses of water each day to prevent constipation.  Try not to strain when you have a bowel movement.  Keep all follow-up appointments. If you have had an infection before:  Increase the fiber in your diet as directed by your health care provider or dietitian.  Take a dietary fiber supplement if your health care provider approves.  Only take medicines as directed by your health care provider. SEEK MEDICAL CARE IF:   You have abdominal pain.  You have bloating.  You have cramps.  You have not gone to the bathroom in 3 days. SEEK IMMEDIATE MEDICAL CARE IF:   Your pain gets worse.  Yourbloating becomes very bad.  You have a fever or chills, and your  symptoms suddenly get worse.  You begin vomiting.  You have bowel movements that are bloody or black. MAKE SURE YOU:  Understand these instructions.  Will watch your condition.  Will get help right away if you are not doing well or get worse. High-Fiber Diet Fiber is found in fruits, vegetables, and grains. A high-fiber diet encourages the addition of more whole grains, legumes, fruits, and vegetables in your diet. The recommended amount of fiber for adult males is 38 g per day. For adult females, it is 25 g per day. Pregnant and lactating women should get 28 g of fiber per day. If you have a digestive or bowel problem, ask your caregiver for advice before adding high-fiber foods to your diet. Eat a variety of  high-fiber foods instead of only a select few type of foods.  PURPOSE To increase stool bulk. To make bowel movements more regular to prevent constipation. To lower cholesterol. To prevent overeating. WHEN IS THIS DIET USED? It may be used if you have constipation and hemorrhoids. It may be used if you have uncomplicated diverticulosis (intestine condition) and irritable bowel syndrome. It may be used if you need help with weight management. It may be used if you want to add it to your diet as a protective measure against atherosclerosis, diabetes, and cancer. SOURCES OF FIBER Whole-grain breads and cereals. Fruits, such as apples, oranges, bananas, berries, prunes, and pears. Vegetables, such as green peas, carrots, sweet potatoes, beets, broccoli, cabbage, spinach, and artichokes. Legumes, such split peas, soy, lentils. Almonds. FIBER CONTENT IN FOODS Starches and Grains / Dietary Fiber (g) Cheerios, 1 cup / 3 g Corn Flakes cereal, 1 cup / 0.7 g Rice crispy treat cereal, 1 cup / 0.3 g Instant oatmeal (cooked),  cup / 2 g Frosted wheat cereal, 1 cup / 5.1 g Brown, long-grain rice (cooked), 1 cup / 3.5 g White, long-grain rice (cooked), 1 cup / 0.6 g Enriched macaroni (cooked), 1 cup / 2.5 g Legumes / Dietary Fiber (g) Baked beans (canned, plain, or vegetarian),  cup / 5.2 g Kidney beans (canned),  cup / 6.8 g Pinto beans (cooked),  cup / 5.5 g Breads and Crackers / Dietary Fiber (g) Plain or honey graham crackers, 2 squares / 0.7 g Saltine crackers, 3 squares / 0.3 g Plain, salted pretzels, 10 pieces / 1.8 g Whole-wheat bread, 1 slice / 1.9 g White bread, 1 slice / 0.7 g Raisin bread, 1 slice / 1.2 g Plain bagel, 3 oz / 2 g Flour tortilla, 1 oz / 0.9 g Corn tortilla, 1 small / 1.5 g Hamburger or hotdog bun, 1 small / 0.9 g Fruits / Dietary Fiber (g) Apple with skin, 1 medium / 4.4 g Sweetened applesauce,  cup / 1.5 g Banana,  medium / 1.5 g Grapes, 10 grapes /  0.4 g Orange, 1 small / 2.3 g Raisin, 1.5 oz / 1.6 g Melon, 1 cup / 1.4 g Vegetables / Dietary Fiber (g) Green beans (canned),  cup / 1.3 g Carrots (cooked),  cup / 2.3 g Broccoli (cooked),  cup / 2.8 g Peas (cooked),  cup / 4.4 g Mashed potatoes,  cup / 1.6 g Lettuce, 1 cup / 0.5 g Corn (canned),  cup / 1.6 g Tomato,  cup / 1.1 g
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
## 2015-08-23 ENCOUNTER — Encounter (HOSPITAL_COMMUNITY): Payer: Self-pay | Admitting: Internal Medicine

## 2015-08-25 ENCOUNTER — Telehealth: Payer: Self-pay | Admitting: Family Medicine

## 2015-08-25 NOTE — Telephone Encounter (Signed)
Question answered. 

## 2015-09-01 DIAGNOSIS — H43822 Vitreomacular adhesion, left eye: Secondary | ICD-10-CM | POA: Diagnosis not present

## 2015-09-01 DIAGNOSIS — H35341 Macular cyst, hole, or pseudohole, right eye: Secondary | ICD-10-CM | POA: Diagnosis not present

## 2015-09-01 DIAGNOSIS — H35363 Drusen (degenerative) of macula, bilateral: Secondary | ICD-10-CM | POA: Diagnosis not present

## 2015-09-14 DIAGNOSIS — Z7984 Long term (current) use of oral hypoglycemic drugs: Secondary | ICD-10-CM | POA: Diagnosis not present

## 2015-09-14 DIAGNOSIS — F329 Major depressive disorder, single episode, unspecified: Secondary | ICD-10-CM | POA: Diagnosis not present

## 2015-09-14 DIAGNOSIS — H35341 Macular cyst, hole, or pseudohole, right eye: Secondary | ICD-10-CM | POA: Diagnosis not present

## 2015-09-14 DIAGNOSIS — I10 Essential (primary) hypertension: Secondary | ICD-10-CM | POA: Diagnosis not present

## 2015-09-14 DIAGNOSIS — R7303 Prediabetes: Secondary | ICD-10-CM | POA: Diagnosis not present

## 2015-09-14 DIAGNOSIS — E785 Hyperlipidemia, unspecified: Secondary | ICD-10-CM | POA: Diagnosis not present

## 2015-09-14 DIAGNOSIS — J45909 Unspecified asthma, uncomplicated: Secondary | ICD-10-CM | POA: Diagnosis not present

## 2015-09-14 DIAGNOSIS — F419 Anxiety disorder, unspecified: Secondary | ICD-10-CM | POA: Diagnosis not present

## 2015-09-14 DIAGNOSIS — K219 Gastro-esophageal reflux disease without esophagitis: Secondary | ICD-10-CM | POA: Diagnosis not present

## 2015-09-14 HISTORY — PX: VITRECTOMY: SHX106

## 2015-09-23 DIAGNOSIS — H43822 Vitreomacular adhesion, left eye: Secondary | ICD-10-CM | POA: Diagnosis not present

## 2015-09-23 DIAGNOSIS — H35341 Macular cyst, hole, or pseudohole, right eye: Secondary | ICD-10-CM | POA: Diagnosis not present

## 2015-09-28 ENCOUNTER — Telehealth: Payer: Self-pay | Admitting: Family Medicine

## 2015-09-29 NOTE — Telephone Encounter (Signed)
Pt nor me have heard of this company

## 2015-10-05 ENCOUNTER — Telehealth: Payer: Self-pay | Admitting: Family Medicine

## 2015-10-10 DIAGNOSIS — G894 Chronic pain syndrome: Secondary | ICD-10-CM | POA: Diagnosis not present

## 2015-10-10 DIAGNOSIS — M4726 Other spondylosis with radiculopathy, lumbar region: Secondary | ICD-10-CM | POA: Diagnosis not present

## 2015-10-10 DIAGNOSIS — M5442 Lumbago with sciatica, left side: Secondary | ICD-10-CM | POA: Diagnosis not present

## 2015-10-10 DIAGNOSIS — M533 Sacrococcygeal disorders, not elsewhere classified: Secondary | ICD-10-CM | POA: Diagnosis not present

## 2015-10-18 ENCOUNTER — Telehealth: Payer: Self-pay | Admitting: Family Medicine

## 2015-10-18 NOTE — Telephone Encounter (Signed)
This encounter will be closed. Handled in other chart number.

## 2015-10-18 NOTE — Telephone Encounter (Signed)
Patient aware we will need documentation from the eye dr in order to place referral.

## 2015-10-25 ENCOUNTER — Telehealth: Payer: Self-pay | Admitting: Family Medicine

## 2015-10-25 ENCOUNTER — Other Ambulatory Visit: Payer: Self-pay | Admitting: Family Medicine

## 2015-10-25 DIAGNOSIS — H269 Unspecified cataract: Secondary | ICD-10-CM

## 2015-10-25 NOTE — Telephone Encounter (Signed)
Please address

## 2015-10-25 NOTE — Telephone Encounter (Signed)
Referral sent. (I used her other account since it has all of her encounters in it.)Thanks, WS

## 2015-10-25 NOTE — Telephone Encounter (Signed)
Debbi, please see Dr. Artemio Aly reply

## 2015-10-27 ENCOUNTER — Ambulatory Visit: Payer: Self-pay | Admitting: Family Medicine

## 2015-11-04 ENCOUNTER — Encounter: Payer: Self-pay | Admitting: Family Medicine

## 2015-11-04 ENCOUNTER — Ambulatory Visit (INDEPENDENT_AMBULATORY_CARE_PROVIDER_SITE_OTHER): Payer: Commercial Managed Care - HMO | Admitting: Family Medicine

## 2015-11-04 VITALS — BP 140/75 | HR 88 | Temp 98.0°F | Ht 62.0 in | Wt 183.4 lb

## 2015-11-04 DIAGNOSIS — K219 Gastro-esophageal reflux disease without esophagitis: Secondary | ICD-10-CM

## 2015-11-04 DIAGNOSIS — I10 Essential (primary) hypertension: Secondary | ICD-10-CM | POA: Diagnosis not present

## 2015-11-04 DIAGNOSIS — N3281 Overactive bladder: Secondary | ICD-10-CM

## 2015-11-04 DIAGNOSIS — E119 Type 2 diabetes mellitus without complications: Secondary | ICD-10-CM | POA: Diagnosis not present

## 2015-11-04 DIAGNOSIS — E785 Hyperlipidemia, unspecified: Secondary | ICD-10-CM | POA: Diagnosis not present

## 2015-11-04 DIAGNOSIS — J0100 Acute maxillary sinusitis, unspecified: Secondary | ICD-10-CM

## 2015-11-04 LAB — POCT GLYCOSYLATED HEMOGLOBIN (HGB A1C): HEMOGLOBIN A1C: 6.9

## 2015-11-04 MED ORDER — BETAMETHASONE SOD PHOS & ACET 6 (3-3) MG/ML IJ SUSP
6.0000 mg | Freq: Once | INTRAMUSCULAR | Status: AC
  Administered 2015-11-04: 6 mg via INTRAMUSCULAR

## 2015-11-04 MED ORDER — BLOOD GLUCOSE MONITOR KIT: PACK | Status: DC

## 2015-11-04 MED ORDER — LEVOFLOXACIN 500 MG PO TABS: 500.0000 mg | ORAL_TABLET | Freq: Every day | ORAL | Status: DC

## 2015-11-04 MED ORDER — PSEUDOEPHEDRINE-GUAIFENESIN ER 60-600 MG PO TB12: 1.0000 | ORAL_TABLET | Freq: Two times a day (BID) | ORAL | Status: AC

## 2015-11-04 MED ORDER — ALPRAZOLAM 1 MG PO TABS: 1.0000 mg | ORAL_TABLET | Freq: Three times a day (TID) | ORAL | Status: DC

## 2015-11-04 NOTE — Addendum Note (Signed)
Addended by: Earlene Plater on: 11/04/2015 09:43 AM   Modules accepted: Orders

## 2015-11-04 NOTE — Progress Notes (Addendum)
Subjective:  Patient ID: [name], female    DOB: [birth year]  Age: 64 y.o. MRN: [id number]  CC: Diabetes; Hyperlipidemia; GAD; and URI   HPI TANAIRI CYPERT presents for  follow-up of hypertension. Patient has no history of headache chest pain or shortness of breath or recent cough. Patient also denies symptoms of TIA such as numbness weakness lateralizing. Patient checks  blood pressure at home and has not had any elevated readings recently. Patient denies side effects from his medication. States taking it regularly.  Patient also  in for follow-up of elevated cholesterol. Doing well without complaints on current medication. Denies side effects of statin including myalgia and arthralgia and nausea. Also in today for liver function testing. Currently no chest pain, shortness of breath or other cardiovascular related symptoms noted.  Follow-up of diabetes. Patient does not check blood sugar at home. Patient denies symptoms such as polyuria, polydipsia, excessive hunger. Nausea this week due to illness. No significant hypoglycemic spells noted. Medications as noted below. Taking them regularly without complication/adverse reaction being reported today.   Patient in for follow-up of GERD. Currently asymptomatic taking  PPI daily. There is no chest pain or heartburn. No hematemesis and no melena. No dysphagia or choking. Onset is remote. Progression is stable. Complicating factors, none.  Patient presents with upper respiratory congestion. Posterior daainage. There is moderate sore throat. Patient reports scant cough There is no fever no chills no sweats. The patient denies being short of breath. Onset was 5 days ago.    History Irean has a past medical history of Overactive bladder; Hypertension; Asthma; Chronic bronchitis ([place]); Anxiety; Chronic leg pain; Hyperlipidemia; Clavicle fracture; Cataract; and Diabetes mellitus without complication ([place]).   She has past surgical history that  includes Abdominal hysterectomy; Cholecystectomy; tear duct surgery (Bilateral); Colonoscopy; and Colonoscopy (N/A, 08/17/2015).   Her family history includes Arthritis in her brother and mother; Cancer in her brother; Diabetes in her father and mother; Heart disease in her father; Hepatitis C in her sister.She reports that she has never smoked. She has never used smokeless tobacco. She reports that she does not drink alcohol or use illicit drugs.  Current Outpatient Prescriptions on File Prior to Visit  Medication Sig Dispense Refill  . amLODipine (NORVASC) 5 MG tablet Take 1 tablet (5 mg total) by mouth daily. 90 tablet 1  . atorvastatin (LIPITOR) 40 MG tablet Take 1 tablet (40 mg total) by mouth daily. 90 tablet 1  . calcium citrate-vitamin D (CALCIUM CITRATE + D) 315-200 MG-UNIT per tablet Take 2 tablets by mouth daily.    . furosemide (LASIX) 20 MG tablet Take 1 tablet (20 mg total) by mouth daily. 90 tablet 1  . HYDROcodone-acetaminophen (NORCO) 10-325 MG per tablet Take 1 tablet by mouth every 6 (six) hours as needed for moderate pain.     . hyoscyamine (LEVSIN SL) 0.125 MG SL tablet Place 1 tablet (0.125 mg total) under the tongue every 4 (four) hours as needed. 30 tablet 0  . ipratropium-albuterol (DUONEB) 0.5-2.5 (3) MG/3ML SOLN Inhale 3 mLs into the lungs every 6 (six) hours as needed (shortness of breath). 4 (four) times a day as needed.    . metFORMIN (GLUCOPHAGE-XR) 500 MG 24 hr tablet Take 1 tablet (500 mg total) by mouth daily with breakfast. 30 tablet 3  . omeprazole (PRILOSEC) 20 MG capsule Take 1 capsule (20 mg total) by mouth daily. 90 capsule 1  . oxybutynin (DITROPAN) 5 MG tablet Take 5 mg by  mouth every 4 (four) hours.     [name] Kitchen venlafaxine XR (EFFEXOR XR) 37.5 MG 24 hr capsule Take 1 capsule (37.5 mg total) by mouth at bedtime. 30 capsule 1  . albuterol (PROVENTIL HFA;VENTOLIN HFA) 108 (90 BASE) MCG/ACT inhaler Inhale 2 puffs into the lungs every 6 (six) hours as needed for  wheezing or shortness of breath.     [name] Kitchen albuterol (PROVENTIL) (2.5 MG/3ML) 0.083% nebulizer solution Take 2.5 mg by nebulization every 6 (six) hours as needed for wheezing or shortness of breath.      No current facility-administered medications on file prior to visit.    ROS Review of Systems  Constitutional: Negative for fever, activity change and appetite change.  HENT: Negative for congestion, rhinorrhea and sore throat.   Eyes: Negative for visual disturbance.  Respiratory: Negative for cough and shortness of breath.   Cardiovascular: Negative for chest pain and palpitations.  Gastrointestinal: Negative for nausea, abdominal pain and diarrhea.  Genitourinary: Negative for dysuria.  Musculoskeletal: Negative for myalgias and arthralgias.    Objective:  BP 140/75 mmHg  Pulse 88  Temp(Src) 98 F (36.7 C) (Oral)  Ht _0  (1.575 m)  Wt 183 lb 6.4 oz (83.19 kg)  BMI 33.54 kg/m2  SpO2 96%  BP Readings from Last 3 Encounters:  11/04/15 140/75  08/17/15 142/73  08/04/15 149/78    Wt Readings from Last 3 Encounters:  11/04/15 183 lb 6.4 oz (83.19 kg)  08/17/15 179 lb (81.194 kg)  08/04/15 182 lb (82.555 kg)     Physical Exam  Constitutional: She is oriented to person, place, and time. She appears well-developed and well-nourished. No distress.  HENT:  Head: Normocephalic and atraumatic.  Right Ear: Tympanic membrane and external ear normal. No decreased hearing is noted.  Left Ear: Tympanic membrane and external ear normal. No decreased hearing is noted.  Nose: Mucosal edema, rhinorrhea and sinus tenderness present. No epistaxis. Right sinus exhibits maxillary sinus tenderness. Right sinus exhibits no frontal sinus tenderness. Left sinus exhibits maxillary sinus tenderness. Left sinus exhibits no frontal sinus tenderness.  Mouth/Throat: Oropharynx is clear and moist. No oropharyngeal exudate or posterior oropharyngeal erythema.  Eyes: Conjunctivae and EOM are normal.  Pupils are equal, round, and reactive to light.  Neck: Normal range of motion. Neck supple. No Brudzinski's sign noted. No thyromegaly present.  Cardiovascular: Normal rate, regular rhythm and normal heart sounds.   No murmur heard. Pulmonary/Chest: Effort normal and breath sounds normal. No respiratory distress. She has no wheezes. She has no rales.  Abdominal: Soft. Bowel sounds are normal. She exhibits no distension. There is no tenderness.  Lymphadenopathy:       Head (right side): No preauricular adenopathy present.       Head (left side): No preauricular adenopathy present.    She has no cervical adenopathy.       Right cervical: No superficial cervical adenopathy present.      Left cervical: No superficial cervical adenopathy present.  Neurological: She is alert and oriented to person, place, and time. She has normal reflexes.  Skin: Skin is warm and dry.  Psychiatric: She has a normal mood and affect. Her behavior is normal. Judgment and thought content normal.    Lab Results  Component Value Date   HGBA1C 6.8 07/20/2015    Lab Results  Component Value Date   WBC 6.0 06/03/2015   HGB 13.3 06/03/2015   HCT 40.2 06/03/2015   GLUCOSE 102* 06/03/2015   CHOL 238* 06/03/2015  TRIG 103 06/03/2015   HDL 54 06/03/2015   LDLCALC 163* 06/03/2015   ALT 9 06/03/2015   AST 15 06/03/2015   NA 140 06/03/2015   K 4.6 06/03/2015   CL 103 06/03/2015   CREATININE 0.87 06/03/2015   BUN 14 06/03/2015   CO2 24 06/03/2015   TSH 1.580 06/03/2015   HGBA1C 6.8 07/20/2015   Diabetic Foot Exam - Simple   Simple Foot Form  Diabetic Foot exam was performed with the following findings:  Yes 11/04/2015  8:16 AM  Visual Inspection  No deformities, no ulcerations, no other skin breakdown bilaterally:  Yes  Sensation Testing  Intact to touch and monofilament testing bilaterally:  Yes  Pulse Check  Posterior Tibialis and Dorsalis pulse intact bilaterally:  Yes  Comments      No results  found.  Assessment & Plan:   Bettyanne was seen today for diabetes, hyperlipidemia, gad and uri.  Diagnoses and all orders for this visit:  Type 2 diabetes mellitus without complication, without long-term current use of insulin (HCC) -     POCT glycosylated hemoglobin (Hb A1C) -     Microalbumin / creatinine urine ratio  Gastroesophageal reflux disease without esophagitis  Essential hypertension -     POCT CBC -     CMP14+EGFR -     Lipid panel  Hyperlipidemia with target LDL less than 70  Overactive bladder -     CMP14+EGFR  Acute maxillary sinusitis, recurrence not specified -     betamethasone acetate-betamethasone sodium phosphate (CELESTONE) injection 6 mg; Inject 1 mL (6 mg total) into the muscle once.  Other orders -     pseudoephedrine-guaifenesin (MUCINEX D) 60-600 MG 12 hr tablet; Take 1 tablet by mouth every 12 (twelve) hours. As needed for congestion -     blood glucose meter kit and supplies KIT; Dispense based on patient and insurance preference. Use up to four times daily as directed. (FOR ICD-9 250.00, 250.01). -     levofloxacin (LEVAQUIN) 500 MG tablet; Take 1 tablet (500 mg total) by mouth daily. -     ALPRAZolam (XANAX) 1 MG tablet; Take 1 tablet (1 mg total) by mouth 3 (three) times daily. TAKE (1) TABLET THREE TIMES DAILY.   I am having Ms. Viruet start on pseudoephedrine-guaifenesin, blood glucose meter kit and supplies, and levofloxacin. I am also having her maintain her albuterol, albuterol, ipratropium-albuterol, amLODipine, atorvastatin, furosemide, omeprazole, oxybutynin, hyoscyamine, HYDROcodone-acetaminophen, calcium citrate-vitamin D, metFORMIN, venlafaxine XR, and ALPRAZolam. We administered betamethasone acetate-betamethasone sodium phosphate.  Meds ordered this encounter  Medications  . betamethasone acetate-betamethasone sodium phosphate (CELESTONE) injection 6 mg    Sig:   . pseudoephedrine-guaifenesin (MUCINEX D) 60-600 MG 12 hr tablet    Sig:  Take 1 tablet by mouth every 12 (twelve) hours. As needed for congestion    Dispense:  20 tablet    Refill:  0  . blood glucose meter kit and supplies KIT    Sig: Dispense based on patient and insurance preference. Use up to four times daily as directed. (FOR ICD-9 250.00, 250.01).    Dispense:  1 each    Refill:  0    Order Specific Question:  Number of strips    Answer:  100    Order Specific Question:  Number of lancets    Answer:  100  . levofloxacin (LEVAQUIN) 500 MG tablet    Sig: Take 1 tablet (500 mg total) by mouth daily.    Dispense:  10 tablet  Refill:  0  . ALPRAZolam (XANAX) 1 MG tablet    Sig: Take 1 tablet (1 mg total) by mouth 3 (three) times daily. TAKE (1) TABLET THREE TIMES DAILY.    Dispense:  90 tablet    Refill:  5   DASH diet reviewed and handout given to patient in order to help bring blood pressure down. We reviewed diabetic diet. Patient has been confused about whether she was a diabetic or not. I explained to her that as of having a hemoglobin A1c of 6.8 she was doubly diabetic. Generally speaking insulin resistant starts up to 10 years prior to diagnosis. She is willing to begin checking her blood sugar regularly.  Follow-up: Return in about 3 months (around 02/02/2016).  [name], M.D.

## 2015-11-04 NOTE — Addendum Note (Signed)
Addended by: Earlene Plater on: 11/04/2015 09:45 AM   Modules accepted: Orders

## 2015-11-04 NOTE — Patient Instructions (Signed)
DASH Eating Plan  DASH stands for "Dietary Approaches to Stop Hypertension." The DASH eating plan is a healthy eating plan that has been shown to reduce high blood pressure (hypertension). Additional health benefits may include reducing the risk of type 2 diabetes mellitus, heart disease, and stroke. The DASH eating plan may also help with weight loss.  WHAT DO I NEED TO KNOW ABOUT THE DASH EATING PLAN?  For the DASH eating plan, you will follow these general guidelines:  · Choose foods with a percent daily value for sodium of less than 5% (as listed on the food label).  · Use salt-free seasonings or herbs instead of table salt or sea salt.  · Check with your health care provider or pharmacist before using salt substitutes.  · Eat lower-sodium products, often labeled as "lower sodium" or "no salt added."  · Eat fresh foods.  · Eat more vegetables, fruits, and low-fat dairy products.  · Choose whole grains. Look for the word "whole" as the first word in the ingredient list.  · Choose fish and skinless chicken or turkey more often than red meat. Limit fish, poultry, and meat to 6 oz (170 g) each day.  · Limit sweets, desserts, sugars, and sugary drinks.  · Choose heart-healthy fats.  · Limit cheese to 1 oz (28 g) per day.  · Eat more home-cooked food and less restaurant, buffet, and fast food.  · Limit fried foods.  · Cook foods using methods other than frying.  · Limit canned vegetables. If you do use them, rinse them well to decrease the sodium.  · When eating at a restaurant, ask that your food be prepared with less salt, or no salt if possible.  WHAT FOODS CAN I EAT?  Seek help from a dietitian for individual calorie needs.  Grains  Whole grain or whole wheat bread. Brown rice. Whole grain or whole wheat pasta. Quinoa, bulgur, and whole grain cereals. Low-sodium cereals. Corn or whole wheat flour tortillas. Whole grain cornbread. Whole grain crackers. Low-sodium crackers.  Vegetables  Fresh or frozen vegetables  (raw, steamed, roasted, or grilled). Low-sodium or reduced-sodium tomato and vegetable juices. Low-sodium or reduced-sodium tomato sauce and paste. Low-sodium or reduced-sodium canned vegetables.   Fruits  All fresh, canned (in natural juice), or frozen fruits.  Meat and Other Protein Products  Ground beef (85% or leaner), grass-fed beef, or beef trimmed of fat. Skinless chicken or turkey. Ground chicken or turkey. Pork trimmed of fat. All fish and seafood. Eggs. Dried beans, peas, or lentils. Unsalted nuts and seeds. Unsalted canned beans.  Dairy  Low-fat dairy products, such as skim or 1% milk, 2% or reduced-fat cheeses, low-fat ricotta or cottage cheese, or plain low-fat yogurt. Low-sodium or reduced-sodium cheeses.  Fats and Oils  Tub margarines without trans fats. Light or reduced-fat mayonnaise and salad dressings (reduced sodium). Avocado. Safflower, olive, or canola oils. Natural peanut or almond butter.  Other  Unsalted popcorn and pretzels.  The items listed above may not be a complete list of recommended foods or beverages. Contact your dietitian for more options.  WHAT FOODS ARE NOT RECOMMENDED?  Grains  White bread. White pasta. White rice. Refined cornbread. Bagels and croissants. Crackers that contain trans fat.  Vegetables  Creamed or fried vegetables. Vegetables in a cheese sauce. Regular canned vegetables. Regular canned tomato sauce and paste. Regular tomato and vegetable juices.  Fruits  Dried fruits. Canned fruit in light or heavy syrup. Fruit juice.  Meat and Other Protein   Products  Fatty cuts of meat. Ribs, chicken wings, bacon, sausage, bologna, salami, chitterlings, fatback, hot dogs, bratwurst, and packaged luncheon meats. Salted nuts and seeds. Canned beans with salt.  Dairy  Whole or 2% milk, cream, half-and-half, and cream cheese. Whole-fat or sweetened yogurt. Full-fat cheeses or blue cheese. Nondairy creamers and whipped toppings. Processed cheese, cheese spreads, or cheese  curds.  Condiments  Onion and garlic salt, seasoned salt, table salt, and sea salt. Canned and packaged gravies. Worcestershire sauce. Tartar sauce. Barbecue sauce. Teriyaki sauce. Soy sauce, including reduced sodium. Steak sauce. Fish sauce. Oyster sauce. Cocktail sauce. Horseradish. Ketchup and mustard. Meat flavorings and tenderizers. Bouillon cubes. Hot sauce. Tabasco sauce. Marinades. Taco seasonings. Relishes.  Fats and Oils  Butter, stick margarine, lard, shortening, ghee, and bacon fat. Coconut, palm kernel, or palm oils. Regular salad dressings.  Other  Pickles and olives. Salted popcorn and pretzels.  The items listed above may not be a complete list of foods and beverages to avoid. Contact your dietitian for more information.  WHERE CAN I FIND MORE INFORMATION?  National Heart, Lung, and [employer] Institute: www.nhlbi.nih.gov/health/health-topics/topics/dash/     This information is not intended to replace advice given to you by your health care provider. Make sure you discuss any questions you have with your health care provider.     Document Released: 11/01/2011 Document Revised: 12/03/2014 Document Reviewed: 09/16/2013  Elsevier Interactive Patient Education ©2016 [employer]

## 2015-11-05 ENCOUNTER — Other Ambulatory Visit: Payer: Self-pay | Admitting: Family Medicine

## 2015-11-05 LAB — CBC WITH DIFFERENTIAL/PLATELET
BASOS: 0 %
Basophils Absolute: 0 10*3/uL (ref 0.0–0.2)
EOS (ABSOLUTE): 0 10*3/uL (ref 0.0–0.4)
Eos: 0 %
Hematocrit: 37.5 % (ref 34.0–46.6)
Hemoglobin: 13.2 g/dL (ref 11.1–15.9)
Immature Grans (Abs): 0 10*3/uL (ref 0.0–0.1)
Immature Granulocytes: 0 %
LYMPHS: 26 %
Lymphocytes Absolute: 2.8 10*3/uL (ref 0.7–3.1)
MCH: 29.4 pg (ref 26.6–33.0)
MCHC: 35.2 g/dL (ref 31.5–35.7)
MCV: 84 fL (ref 79–97)
MONOCYTES: 6 %
MONOS ABS: 0.6 10*3/uL (ref 0.1–0.9)
NEUTROS ABS: 7.4 10*3/uL — AB (ref 1.4–7.0)
Neutrophils: 68 %
PLATELETS: 258 10*3/uL (ref 150–379)
RBC: 4.49 x10E6/uL (ref 3.77–5.28)
RDW: 13.7 % (ref 12.3–15.4)
WBC: 10.9 10*3/uL — ABNORMAL HIGH (ref 3.4–10.8)

## 2015-11-05 LAB — CMP14+EGFR
A/G RATIO: 1.9 (ref 1.1–2.5)
ALK PHOS: 127 IU/L — AB (ref 39–117)
ALT: 12 IU/L (ref 0–32)
AST: 13 IU/L (ref 0–40)
Albumin: 4.1 g/dL (ref 3.6–4.8)
BUN/Creatinine Ratio: 16 (ref 11–26)
BUN: 14 mg/dL (ref 8–27)
Bilirubin Total: 0.5 mg/dL (ref 0.0–1.2)
CALCIUM: 9.5 mg/dL (ref 8.7–10.3)
CO2: 25 mmol/L (ref 18–29)
CREATININE: 0.87 mg/dL (ref 0.57–1.00)
Chloride: 100 mmol/L (ref 97–106)
GFR calc Af Amer: 81 mL/min/{1.73_m2} (ref >59)
GFR, EST NON AFRICAN AMERICAN: 70 mL/min/{1.73_m2} (ref >59)
Globulin, Total: 2.2 g/dL (ref 1.5–4.5)
Glucose: 134 mg/dL — ABNORMAL HIGH (ref 65–99)
POTASSIUM: 4.2 mmol/L (ref 3.5–5.2)
Sodium: 138 mmol/L (ref 136–144)
Total Protein: 6.3 g/dL (ref 6.0–8.5)

## 2015-11-05 LAB — LIPID PANEL
CHOL/HDL RATIO: 4.3 ratio (ref 0.0–4.4)
CHOLESTEROL TOTAL: 221 mg/dL — AB (ref 100–199)
HDL: 51 mg/dL (ref >39)
LDL Calculated: 149 mg/dL — ABNORMAL HIGH (ref 0–99)
TRIGLYCERIDES: 106 mg/dL (ref 0–149)
VLDL Cholesterol Cal: 21 mg/dL (ref 5–40)

## 2015-11-05 MED ORDER — ATORVASTATIN CALCIUM 80 MG PO TABS: 80.0000 mg | ORAL_TABLET | Freq: Every day | ORAL | Status: DC

## 2015-11-07 ENCOUNTER — Telehealth: Payer: Self-pay | Admitting: Family Medicine

## 2015-11-17 ENCOUNTER — Encounter: Payer: Self-pay | Admitting: *Deleted

## 2015-12-27 ENCOUNTER — Encounter: Payer: Self-pay | Admitting: *Deleted

## 2015-12-29 ENCOUNTER — Encounter: Payer: Self-pay | Admitting: Pediatrics

## 2015-12-29 ENCOUNTER — Ambulatory Visit (INDEPENDENT_AMBULATORY_CARE_PROVIDER_SITE_OTHER): Payer: Commercial Managed Care - HMO | Admitting: Pediatrics

## 2015-12-29 ENCOUNTER — Ambulatory Visit (INDEPENDENT_AMBULATORY_CARE_PROVIDER_SITE_OTHER): Payer: Commercial Managed Care - HMO

## 2015-12-29 VITALS — BP 150/83 | HR 86 | Temp 97.8°F | Ht 62.0 in | Wt 184.2 lb

## 2015-12-29 DIAGNOSIS — R6889 Other general symptoms and signs: Secondary | ICD-10-CM

## 2015-12-29 DIAGNOSIS — R05 Cough: Secondary | ICD-10-CM | POA: Diagnosis not present

## 2015-12-29 DIAGNOSIS — J189 Pneumonia, unspecified organism: Secondary | ICD-10-CM

## 2015-12-29 DIAGNOSIS — R059 Cough, unspecified: Secondary | ICD-10-CM

## 2015-12-29 LAB — POCT INFLUENZA A/B
INFLUENZA A, POC: NEGATIVE
INFLUENZA B, POC: NEGATIVE

## 2015-12-29 MED ORDER — AZITHROMYCIN 250 MG PO TABS: ORAL_TABLET | ORAL | Status: DC

## 2015-12-29 NOTE — Progress Notes (Signed)
    Subjective:    Patient ID: Yvonne [name], female    DOB: 30-Dec-1950, 65 y.o.   MRN: [id number]  CC: Nasal Congestion; Generalized Body Aches; and Fever   HPI: Yvonne [name] is a 65 y.o. female presenting for Nasal Congestion; Generalized Body Aches; and Fever  3 days ago was a little congested Now with fever, muscle aches, headache, tooth ache, ear ache Throat is sore Appetite down No belly pain Not coughing much    Depression screen [facility] 2/9 12/29/2015 11/04/2015 08/04/2015 07/20/2015 06/03/2015  Decreased Interest 0 1 3 1 2   Down, Depressed, Hopeless 0 0 3 1 3   PHQ - 2 Score 0 1 6 2 5   Altered sleeping - - 2 3 3   Tired, decreased energy - - 3 2 2   Change in appetite - - 3 2 3   Feeling bad or failure about yourself  - - 2 1 2   Trouble concentrating - - 2 0 2  Moving slowly or fidgety/restless - - 3 0 3  Suicidal thoughts - - 1 0 2  PHQ-9 Score - - 22 10 22   Difficult doing work/chores - - - Very difficult -     Relevant past medical, surgical, family and social history reviewed and updated as indicated. Interim medical history since our last visit reviewed. Allergies and medications reviewed and updated.    ROS: Per HPI unless specifically indicated above  History  Smoking status  . Never Smoker   Smokeless tobacco  . Never Used    Past Medical History Patient Active Problem List   Diagnosis Date Noted  . Hyperlipidemia with target LDL less than 70 11/04/2015  . Obesity 07/20/2015  . Osteopenia 07/20/2015  . Type 2 diabetes mellitus ([place]) 07/20/2015  . Overactive bladder   . Hypertension   . Anxiety   . Acid reflux disease 08/17/2014        Objective:    BP 150/83 mmHg  Pulse 86  Temp(Src) 97.8 F (36.6 C) (Oral)  Ht 5\' 2"  (1.575 m)  Wt 184 lb 3.2 oz (83.553 kg)  BMI 33.68 kg/m2  Wt Readings from Last 3 Encounters:  12/29/15 184 lb 3.2 oz (83.553 kg)  11/04/15 183 lb 6.4 oz (83.19 kg)  08/17/15 179 lb (81.194 kg)     Gen: NAD, alert,  cooperative with exam, NCAT EYES: EOMI, no scleral injection or icterus ENT:  TMs dull gray with clear effusions b/l, OP with mild erythema LYMPH: no cervical LAD CV: NRRR, normal S1/S2, no murmur, distal pulses 2+ b/l Resp: rhonchi RUL compared with L, otherwise CTABL, no wheezes, normal WOB Abd: +BS, soft, NTND. no guarding or organomegaly Ext: No edema, warm Neuro: Alert and oriented     Assessment & Plan:     Yvonne [name] was seen today for nasal congestion, generalized body aches and fever, flu negative, no obvious pneumonia on CXR but given lung exam findings will still treat for CAP with azithro as below. Discussed return precautions.  Diagnoses and all orders for this visit:  Flu-like symptoms -     POCT Influenza A/B  Cough -     DG Chest 2 View; Future  Community acquired pneumonia -     azithromycin (ZITHROMAX) 250 MG tablet; Take 2 the first day and then one each day after.   Follow up plan: As needed  Assunta Found, MD [place] 12/29/2015, 3:10 PM

## 2015-12-29 NOTE — Patient Instructions (Addendum)
Tylenol as needed for fever Start azithromycin, take 2 first day, then one daily

## 2016-01-02 ENCOUNTER — Telehealth: Payer: Self-pay | Admitting: Family Medicine

## 2016-01-02 NOTE — Telephone Encounter (Signed)
Pt notified of Dr Vincent's recommendation Verbalizes understanding 

## 2016-01-02 NOTE — Telephone Encounter (Signed)
The azithromycin is a very long acting medicine, it will keep working in her system for another 4-5 days so she shouldn't need anything else now as long as improving. If she gets any worse let us know though.

## 2016-01-12 ENCOUNTER — Other Ambulatory Visit: Payer: Self-pay | Admitting: Family Medicine

## 2016-01-12 DIAGNOSIS — Z1231 Encounter for screening mammogram for malignant neoplasm of breast: Secondary | ICD-10-CM

## 2016-01-14 ENCOUNTER — Other Ambulatory Visit: Payer: Self-pay | Admitting: Family Medicine

## 2016-01-14 NOTE — Telephone Encounter (Signed)
Called and discussed - Pt called after hours  Explained We don't refill antibiotics over the phone, discussed supportive care. Can go to urgent care if needed right away otherwise call for appt Monday.   Laroy Apple, MD [facility] 01/14/2016, 12:09 PM

## 2016-01-19 ENCOUNTER — Ambulatory Visit (HOSPITAL_COMMUNITY)
Admission: RE | Admit: 2016-01-19 | Discharge: 2016-01-19 | Disposition: A | Discharge status: Home / Self Care | Payer: Commercial Managed Care - HMO | Source: Ambulatory Visit | Attending: Family Medicine | Admitting: Family Medicine

## 2016-01-19 DIAGNOSIS — Z1231 Encounter for screening mammogram for malignant neoplasm of breast: Secondary | ICD-10-CM | POA: Diagnosis not present

## 2016-01-20 DIAGNOSIS — H43822 Vitreomacular adhesion, left eye: Secondary | ICD-10-CM | POA: Diagnosis not present

## 2016-01-20 DIAGNOSIS — H35341 Macular cyst, hole, or pseudohole, right eye: Secondary | ICD-10-CM | POA: Diagnosis not present

## 2016-01-20 DIAGNOSIS — H2513 Age-related nuclear cataract, bilateral: Secondary | ICD-10-CM | POA: Diagnosis not present

## 2016-01-26 DIAGNOSIS — H2511 Age-related nuclear cataract, right eye: Secondary | ICD-10-CM | POA: Diagnosis not present

## 2016-01-27 ENCOUNTER — Telehealth: Payer: Self-pay | Admitting: Family Medicine

## 2016-01-27 DIAGNOSIS — M549 Dorsalgia, unspecified: Secondary | ICD-10-CM

## 2016-01-30 NOTE — Telephone Encounter (Signed)
Refer as requested, Thanks, WS

## 2016-02-03 ENCOUNTER — Ambulatory Visit: Payer: Commercial Managed Care - HMO | Admitting: Family Medicine

## 2016-02-07 ENCOUNTER — Ambulatory Visit (INDEPENDENT_AMBULATORY_CARE_PROVIDER_SITE_OTHER): Payer: Commercial Managed Care - HMO | Admitting: Family Medicine

## 2016-02-07 ENCOUNTER — Encounter: Payer: Self-pay | Admitting: Family Medicine

## 2016-02-07 VITALS — BP 152/86 | HR 70 | Temp 96.9°F | Ht 62.0 in | Wt 187.0 lb

## 2016-02-07 DIAGNOSIS — I10 Essential (primary) hypertension: Secondary | ICD-10-CM

## 2016-02-07 DIAGNOSIS — E119 Type 2 diabetes mellitus without complications: Secondary | ICD-10-CM

## 2016-02-07 DIAGNOSIS — E785 Hyperlipidemia, unspecified: Secondary | ICD-10-CM

## 2016-02-07 LAB — BAYER DCA HB A1C WAIVED: HB A1C (BAYER DCA - WAIVED): 6.8 % (ref <7.0)

## 2016-02-07 MED ORDER — AMLODIPINE BESYLATE 10 MG PO TABS: 5.0000 mg | ORAL_TABLET | Freq: Every day | ORAL | Status: DC

## 2016-02-07 MED ORDER — HYDROCODONE-ACETAMINOPHEN 10-325 MG PO TABS: 1.0000 | ORAL_TABLET | Freq: Four times a day (QID) | ORAL | Status: DC | PRN

## 2016-02-07 NOTE — Progress Notes (Signed)
Subjective:  Patient ID: Yvonne [name], female    DOB: November 23, 1951  Age: 65 y.o. MRN: [id number]  CC: 3 month follow up   HPI [name] presents for  follow-up of hypertension. Patient has no history of headache chest pain or shortness of breath or recent cough. Patient also denies symptoms of TIA such as numbness weakness lateralizing. Patient checks  blood pressure at home and has not had any elevated readings recently. Patient denies side effects from his medication. States taking it regularly.  Patient also  in for follow-up of elevated cholesterol. Doing well without complaints on current medication. Denies side effects of statin including myalgia and arthralgia and nausea. Also in today for liver function testing. Currently no chest pain, shortness of breath or other cardiovascular related symptoms noted.  Follow-up of diabetes. Patient does check blood sugar at home. Readings run below 125 Patient denies symptoms such as polyuria, polydipsia, excessive hunger, nausea Occasional mild hypoglycemic spells noted. Relief with eating a snack. Medications as noted below. Taking them regularly without complication/adverse reaction being reported today.   Referral not received by pain clinic. Couldn't get meds.   History Yvonne [name] has a past medical history of Overactive bladder; Hypertension; Asthma; Chronic bronchitis ([place]); Anxiety; Chronic leg pain; Hyperlipidemia; Clavicle fracture; Cataract; and Diabetes mellitus without complication ([place]).   She has past surgical history that includes Abdominal hysterectomy; Cholecystectomy; tear duct surgery (Bilateral); Colonoscopy; and Colonoscopy (N/A, 08/17/2015).   Her family history includes Arthritis in her brother and mother; Cancer in her brother; Diabetes in her father and mother; Heart disease in her father; Hepatitis C in her sister.She reports that she has never smoked. She has never used smokeless tobacco. She reports that she does not  drink alcohol or use illicit drugs.  Current Outpatient Prescriptions on File Prior to Visit  Medication Sig Dispense Refill  . ALPRAZolam (XANAX) 1 MG tablet Take 1 tablet (1 mg total) by mouth 3 (three) times daily. TAKE (1) TABLET THREE TIMES DAILY. 90 tablet 5  . atorvastatin (LIPITOR) 80 MG tablet Take 1 tablet (80 mg total) by mouth daily. 90 tablet 1  . blood glucose meter kit and supplies KIT Dispense based on patient and insurance preference. Use up to four times daily as directed. (FOR ICD-9 250.00, 250.01). 1 each 0  . calcium citrate-vitamin D (CALCIUM CITRATE + D) 315-200 MG-UNIT per tablet Take 2 tablets by mouth daily.    . furosemide (LASIX) 20 MG tablet Take 1 tablet (20 mg total) by mouth daily. 90 tablet 1  . hyoscyamine (LEVSIN SL) 0.125 MG SL tablet Place 1 tablet (0.125 mg total) under the tongue every 4 (four) hours as needed. 30 tablet 0  . metFORMIN (GLUCOPHAGE-XR) 500 MG 24 hr tablet Take 1 tablet (500 mg total) by mouth daily with breakfast. 30 tablet 3  . omeprazole (PRILOSEC) 20 MG capsule Take 1 capsule (20 mg total) by mouth daily. 90 capsule 1  . oxybutynin (DITROPAN) 5 MG tablet Take 5 mg by mouth every 4 (four) hours.     [name] Kitchen albuterol (PROVENTIL HFA;VENTOLIN HFA) 108 (90 BASE) MCG/ACT inhaler Inhale 2 puffs into the lungs every 6 (six) hours as needed for wheezing or shortness of breath. Reported on 02/07/2016    . albuterol (PROVENTIL) (2.5 MG/3ML) 0.083% nebulizer solution Take 2.5 mg by nebulization every 6 (six) hours as needed for wheezing or shortness of breath. Reported on 02/07/2016    . ipratropium-albuterol (DUONEB) 0.5-2.5 (3) MG/3ML SOLN Inhale  3 mLs into the lungs every 6 (six) hours as needed (shortness of breath). Reported on 02/07/2016    . venlafaxine XR (EFFEXOR XR) 37.5 MG 24 hr capsule Take 1 capsule (37.5 mg total) by mouth at bedtime. 30 capsule 1   No current facility-administered medications on file prior to visit.    ROS Review of Systems    Constitutional: Negative for fever, activity change and appetite change.  HENT: Negative for congestion, rhinorrhea and sore throat.   Eyes: Negative for visual disturbance.  Respiratory: Negative for cough and shortness of breath.   Cardiovascular: Negative for chest pain and palpitations.  Gastrointestinal: Negative for nausea, abdominal pain and diarrhea.  Genitourinary: Negative for dysuria.  Musculoskeletal: Positive for back pain and arthralgias. Negative for myalgias.    Objective:  BP 152/86 mmHg  Pulse 70  Temp(Src) 96.9 F (36.1 C) (Oral)  Ht '5\' 2"'$  (1.575 m)  Wt 187 lb (84.823 kg)  BMI 34.19 kg/m2  BP Readings from Last 3 Encounters:  02/07/16 152/86  12/29/15 150/83  11/04/15 140/75    Wt Readings from Last 3 Encounters:  02/07/16 187 lb (84.823 kg)  12/29/15 184 lb 3.2 oz (83.553 kg)  11/04/15 183 lb 6.4 oz (83.19 kg)     Physical Exam  Constitutional: She is oriented to person, place, and time. She appears well-developed and well-nourished. No distress.  HENT:  Head: Normocephalic and atraumatic.  Right Ear: External ear normal.  Left Ear: External ear normal.  Nose: Nose normal.  Mouth/Throat: Oropharynx is clear and moist.  Eyes: Conjunctivae and EOM are normal. Pupils are equal, round, and reactive to light.  Neck: Normal range of motion. Neck supple. No thyromegaly present.  Cardiovascular: Normal rate, regular rhythm and normal heart sounds.   No murmur heard. Pulmonary/Chest: Effort normal and breath sounds normal. No respiratory distress. She has no wheezes. She has no rales.  Abdominal: Soft. Bowel sounds are normal. She exhibits no distension. There is no tenderness.  Lymphadenopathy:    She has no cervical adenopathy.  Neurological: She is alert and oriented to person, place, and time. She has normal reflexes.  Skin: Skin is warm and dry.  Psychiatric: She has a normal mood and affect. Her behavior is normal. Judgment and thought content  normal.    Lab Results  Component Value Date   HGBA1C 6.9 11/04/2015   HGBA1C 6.8 07/20/2015    Lab Results  Component Value Date   WBC 10.9* 11/04/2015   HGB 13.3 06/03/2015   HCT 37.5 11/04/2015   PLT 258 11/04/2015   GLUCOSE 134* 11/04/2015   CHOL 221* 11/04/2015   TRIG 106 11/04/2015   HDL 51 11/04/2015   LDLCALC 149* 11/04/2015   ALT 12 11/04/2015   AST 13 11/04/2015   NA 138 11/04/2015   K 4.2 11/04/2015   CL 100 11/04/2015   CREATININE 0.87 11/04/2015   BUN 14 11/04/2015   CO2 25 11/04/2015   TSH 1.580 06/03/2015   HGBA1C 6.9 11/04/2015    Mm Screening Breast Tomo Bilateral  01/20/2016  CLINICAL DATA:  Screening. EXAM: DIGITAL SCREENING BILATERAL MAMMOGRAM WITH 3D TOMO WITH CAD COMPARISON:  Previous exam(s). ACR Breast Density Category b: There are scattered areas of fibroglandular density. FINDINGS: There are no findings suspicious for malignancy. Images were processed with CAD. IMPRESSION: No mammographic evidence of malignancy. A result letter of this screening mammogram will be mailed directly to the patient. RECOMMENDATION: Screening mammogram in one year. (Code:SM-B-01Y) BI-RADS CATEGORY  1:  Negative. Electronically Signed   By: Abelardo Diesel M.D.   On: 01/20/2016 12:14    Assessment & Plan:   Aryka was seen today for 3 month follow up.  Diagnoses and all orders for this visit:  Essential hypertension -     CBC with Differential/Platelet -     CMP14+EGFR  Hyperlipidemia with target LDL less than 70 -     CBC with Differential/Platelet -     CMP14+EGFR -     Lipid panel  Type 2 diabetes mellitus without complication, without long-term current use of insulin (HCC) -     CBC with Differential/Platelet -     CMP14+EGFR -     Bayer DCA Hb A1c Waived  Other orders -     amLODipine (NORVASC) 10 MG tablet; Take 0.5 tablets (5 mg total) by mouth daily. -     HYDROcodone-acetaminophen (NORCO) 10-325 MG tablet; Take 1 tablet by mouth every 6 (six) hours as  needed for moderate pain. Reported on 02/07/2016  I have discontinued Ms. Manthei's azithromycin. I have also changed her amLODipine and HYDROcodone-acetaminophen. Additionally, I am having her maintain her albuterol, albuterol, ipratropium-albuterol, furosemide, omeprazole, oxybutynin, hyoscyamine, calcium citrate-vitamin D, metFORMIN, venlafaxine XR, blood glucose meter kit and supplies, ALPRAZolam, and atorvastatin.  Meds ordered this encounter  Medications  . amLODipine (NORVASC) 10 MG tablet    Sig: Take 0.5 tablets (5 mg total) by mouth daily.    Dispense:  90 tablet    Refill:  3  . HYDROcodone-acetaminophen (NORCO) 10-325 MG tablet    Sig: Take 1 tablet by mouth every 6 (six) hours as needed for moderate pain. Reported on 02/07/2016    Dispense:  60 tablet    Refill:  0     Follow-up: Return in about 3 months (around 05/09/2016) for diabetes, hypertension.  [name], M.D.

## 2016-02-08 ENCOUNTER — Other Ambulatory Visit: Payer: Self-pay | Admitting: Family Medicine

## 2016-02-08 LAB — LIPID PANEL
CHOL/HDL RATIO: 4.1 ratio (ref 0.0–4.4)
CHOLESTEROL TOTAL: 202 mg/dL — AB (ref 100–199)
HDL: 49 mg/dL (ref >39)
LDL CALC: 123 mg/dL — AB (ref 0–99)
TRIGLYCERIDES: 149 mg/dL (ref 0–149)
VLDL CHOLESTEROL CAL: 30 mg/dL (ref 5–40)

## 2016-02-08 LAB — CBC WITH DIFFERENTIAL/PLATELET
BASOS ABS: 0 10*3/uL (ref 0.0–0.2)
Basos: 1 %
EOS (ABSOLUTE): 0.1 10*3/uL (ref 0.0–0.4)
EOS: 2 %
HEMATOCRIT: 39.6 % (ref 34.0–46.6)
HEMOGLOBIN: 13.4 g/dL (ref 11.1–15.9)
IMMATURE GRANULOCYTES: 0 %
Immature Grans (Abs): 0 10*3/uL (ref 0.0–0.1)
LYMPHS: 44 %
Lymphocytes Absolute: 2.5 10*3/uL (ref 0.7–3.1)
MCH: 29.3 pg (ref 26.6–33.0)
MCHC: 33.8 g/dL (ref 31.5–35.7)
MCV: 87 fL (ref 79–97)
MONOCYTES: 6 %
Monocytes Absolute: 0.4 10*3/uL (ref 0.1–0.9)
NEUTROS PCT: 47 %
Neutrophils Absolute: 2.6 10*3/uL (ref 1.4–7.0)
Platelets: 249 10*3/uL (ref 150–379)
RBC: 4.58 x10E6/uL (ref 3.77–5.28)
RDW: 13.6 % (ref 12.3–15.4)
WBC: 5.6 10*3/uL (ref 3.4–10.8)

## 2016-02-08 LAB — CMP14+EGFR
ALBUMIN: 3.9 g/dL (ref 3.6–4.8)
ALT: 14 IU/L (ref 0–32)
AST: 15 IU/L (ref 0–40)
Albumin/Globulin Ratio: 1.6 (ref 1.2–2.2)
Alkaline Phosphatase: 125 IU/L — ABNORMAL HIGH (ref 39–117)
BUN / CREAT RATIO: 19 (ref 11–26)
BUN: 15 mg/dL (ref 8–27)
Bilirubin Total: 0.3 mg/dL (ref 0.0–1.2)
CALCIUM: 9 mg/dL (ref 8.7–10.3)
CO2: 25 mmol/L (ref 18–29)
CREATININE: 0.77 mg/dL (ref 0.57–1.00)
Chloride: 102 mmol/L (ref 96–106)
GFR calc Af Amer: 93 mL/min/{1.73_m2} (ref >59)
GFR, EST NON AFRICAN AMERICAN: 81 mL/min/{1.73_m2} (ref >59)
GLOBULIN, TOTAL: 2.4 g/dL (ref 1.5–4.5)
GLUCOSE: 140 mg/dL — AB (ref 65–99)
Potassium: 4.3 mmol/L (ref 3.5–5.2)
SODIUM: 142 mmol/L (ref 134–144)
Total Protein: 6.3 g/dL (ref 6.0–8.5)

## 2016-02-08 MED ORDER — EZETIMIBE 10 MG PO TABS: 10.0000 mg | ORAL_TABLET | Freq: Every day | ORAL | Status: DC

## 2016-02-13 ENCOUNTER — Ambulatory Visit (INDEPENDENT_AMBULATORY_CARE_PROVIDER_SITE_OTHER): Payer: Commercial Managed Care - HMO | Admitting: Family Medicine

## 2016-02-13 ENCOUNTER — Encounter: Payer: Self-pay | Admitting: Family Medicine

## 2016-02-13 VITALS — BP 136/84 | HR 88 | Temp 99.4°F | Ht 62.0 in | Wt 185.6 lb

## 2016-02-13 DIAGNOSIS — J441 Chronic obstructive pulmonary disease with (acute) exacerbation: Secondary | ICD-10-CM | POA: Diagnosis not present

## 2016-02-13 MED ORDER — METHYLPREDNISOLONE ACETATE 80 MG/ML IJ SUSP
80.0000 mg | Freq: Once | INTRAMUSCULAR | Status: AC
  Administered 2016-02-13: 80 mg via INTRAMUSCULAR

## 2016-02-13 MED ORDER — CEFTRIAXONE SODIUM 1 G IJ SOLR
1.0000 g | INTRAMUSCULAR | Status: AC
  Administered 2016-02-13: 1 g via INTRAMUSCULAR

## 2016-02-13 MED ORDER — ALBUTEROL SULFATE HFA 108 (90 BASE) MCG/ACT IN AERS: 2.0000 | INHALATION_SPRAY | Freq: Four times a day (QID) | RESPIRATORY_TRACT | Status: DC | PRN

## 2016-02-13 MED ORDER — PREDNISONE 20 MG PO TABS: ORAL_TABLET | ORAL | Status: DC

## 2016-02-13 MED ORDER — AZITHROMYCIN 250 MG PO TABS: ORAL_TABLET | ORAL | Status: DC

## 2016-02-13 MED ORDER — FLUTICASONE FUROATE-VILANTEROL 100-25 MCG/INH IN AEPB
1.0000 | INHALATION_SPRAY | Freq: Every day | RESPIRATORY_TRACT | Status: DC
Start: 2016-02-13 — End: 2016-07-17

## 2016-02-13 NOTE — Progress Notes (Signed)
BP 136/84 mmHg  Pulse 88  Temp(Src) 99.4 F (37.4 C) (Oral)  Ht '5\' 2"'$  (1.575 m)  Wt 185 lb 9.6 oz (84.188 kg)  BMI 33.94 kg/m2  SpO2 99%   Subjective:    Patient ID: Yvonne [name], female    DOB: 09/09/1951, 65 y.o.   MRN: [id number]  HPI: Yvonne [name] is a 65 y.o. female presenting on 02/13/2016 for Sinusitis; Bronchitis; and Cough   HPI Cough and shortness of breath and chest congestion Patient has been having cough and shortness of breath and chest congestion that has been going on for the past week. She also says she's been having shortness of breath and wheezing. Her cough has been productive of yellow-green sputum. She denies any fevers or chills. She says she gets like this regularly and has had asthma or been diagnosed with asthma in the past. She has never been a smoker but she has been around smokers all her life and currently lives with 11 different smokers. Asthma does run in the family and previously in her life she was also on Advair and pro-air but she has not of those currently.  Relevant past medical, surgical, family and social history reviewed and updated as indicated. Interim medical history since our last visit reviewed. Allergies and medications reviewed and updated.  Review of Systems  Constitutional: Negative for fever and chills.  HENT: Positive for congestion, postnasal drip, rhinorrhea, sinus pressure, sneezing and sore throat. Negative for ear discharge and ear pain.   Eyes: Negative for pain, redness and visual disturbance.  Respiratory: Positive for cough, chest tightness, shortness of breath and wheezing.   Cardiovascular: Negative for chest pain and leg swelling.  Genitourinary: Negative for dysuria and difficulty urinating.  Musculoskeletal: Negative for back pain and gait problem.  Skin: Negative for rash.  Neurological: Negative for dizziness, light-headedness and headaches.  Psychiatric/Behavioral: Negative for behavioral problems and agitation.    All other systems reviewed and are negative.   Per HPI unless specifically indicated above     Medication List       This list is accurate as of: 02/13/16  4:07 PM.  Always use your most recent med list.               albuterol (2.5 MG/3ML) 0.083% nebulizer solution  Commonly known as:  PROVENTIL  Take 2.5 mg by nebulization every 6 (six) hours as needed for wheezing or shortness of breath. Reported on 02/07/2016     albuterol 108 (90 Base) MCG/ACT inhaler  Commonly known as:  PROVENTIL HFA;VENTOLIN HFA  Inhale 2 puffs into the lungs every 6 (six) hours as needed for wheezing or shortness of breath.     ALPRAZolam 1 MG tablet  Commonly known as:  XANAX  Take 1 tablet (1 mg total) by mouth 3 (three) times daily. TAKE (1) TABLET THREE TIMES DAILY.     amLODipine 10 MG tablet  Commonly known as:  NORVASC  Take 0.5 tablets (5 mg total) by mouth daily.     atorvastatin 80 MG tablet  Commonly known as:  LIPITOR  Take 1 tablet (80 mg total) by mouth daily.     azithromycin 250 MG tablet  Commonly known as:  ZITHROMAX  Take 2 the first day and then one each day after.     blood glucose meter kit and supplies Kit  Dispense based on patient and insurance preference. Use up to four times daily as directed. (FOR ICD-9 250.00, 250.01).  calcium citrate-vitamin D 315-200 MG-UNIT tablet  Commonly known as:  CALCIUM CITRATE + D  Take 2 tablets by mouth daily.     ezetimibe 10 MG tablet  Commonly known as:  ZETIA  Take 1 tablet (10 mg total) by mouth daily. For cholesterol     fluticasone furoate-vilanterol 100-25 MCG/INH Aepb  Commonly known as:  BREO ELLIPTA  Inhale 1 puff into the lungs daily.     furosemide 20 MG tablet  Commonly known as:  LASIX  Take 1 tablet (20 mg total) by mouth daily.     HYDROcodone-acetaminophen 10-325 MG tablet  Commonly known as:  NORCO  Take 1 tablet by mouth every 6 (six) hours as needed for moderate pain. Reported on 02/07/2016      hyoscyamine 0.125 MG SL tablet  Commonly known as:  LEVSIN SL  Place 1 tablet (0.125 mg total) under the tongue every 4 (four) hours as needed.     ipratropium-albuterol 0.5-2.5 (3) MG/3ML Soln  Commonly known as:  DUONEB  Inhale 3 mLs into the lungs every 6 (six) hours as needed (shortness of breath). Reported on 02/07/2016     metFORMIN 500 MG 24 hr tablet  Commonly known as:  GLUCOPHAGE-XR  Take 1 tablet (500 mg total) by mouth daily with breakfast.     omeprazole 20 MG capsule  Commonly known as:  PRILOSEC  Take 1 capsule (20 mg total) by mouth daily.     oxybutynin 5 MG tablet  Commonly known as:  DITROPAN  Take 5 mg by mouth every 4 (four) hours.     predniSONE 20 MG tablet  Commonly known as:  DELTASONE  2 po at same time daily for 5 days     venlafaxine XR 37.5 MG 24 hr capsule  Commonly known as:  EFFEXOR XR  Take 1 capsule (37.5 mg total) by mouth at bedtime.           Objective:    BP 136/84 mmHg  Pulse 88  Temp(Src) 99.4 F (37.4 C) (Oral)  Ht '5\' 2"'$  (1.575 m)  Wt 185 lb 9.6 oz (84.188 kg)  BMI 33.94 kg/m2  SpO2 99%  Wt Readings from Last 3 Encounters:  02/13/16 185 lb 9.6 oz (84.188 kg)  02/07/16 187 lb (84.823 kg)  12/29/15 184 lb 3.2 oz (83.553 kg)    Physical Exam  Constitutional: She is oriented to person, place, and time. She appears well-developed and well-nourished. No distress.  HENT:  Right Ear: Tympanic membrane, external ear and ear canal normal.  Left Ear: Tympanic membrane, external ear and ear canal normal.  Nose: Mucosal edema and rhinorrhea present. No epistaxis. Right sinus exhibits no maxillary sinus tenderness and no frontal sinus tenderness. Left sinus exhibits no maxillary sinus tenderness and no frontal sinus tenderness.  Mouth/Throat: Uvula is midline and mucous membranes are normal. Posterior oropharyngeal edema and posterior oropharyngeal erythema present. No oropharyngeal exudate or tonsillar abscesses.  Eyes: Conjunctivae and  EOM are normal.  Neck: Neck supple. No thyromegaly present.  Cardiovascular: Normal rate, regular rhythm, normal heart sounds and intact distal pulses.   No murmur heard. Pulmonary/Chest: Effort normal. No accessory muscle usage. No respiratory distress. She has no decreased breath sounds. She has wheezes. She has rhonchi. She has rales (Left upper lobe).  Musculoskeletal: Normal range of motion. She exhibits no edema or tenderness.  Lymphadenopathy:    She has no cervical adenopathy.  Neurological: She is alert and oriented to person, place, and time. Coordination  normal.  Skin: Skin is warm and dry. No rash noted. She is not diaphoretic.  Psychiatric: She has a normal mood and affect. Her behavior is normal.  Vitals reviewed.     Assessment & Plan:   Problem List Items Addressed This Visit    None    Visit Diagnoses    COPD exacerbation ([place])    -  Primary    Relevant Medications    cefTRIAXone (ROCEPHIN) injection 1 g (Completed)    methylPREDNISolone acetate (DEPO-MEDROL) injection 80 mg (Completed)    albuterol (PROVENTIL HFA;VENTOLIN HFA) 108 (90 Base) MCG/ACT inhaler    fluticasone furoate-vilanterol (BREO ELLIPTA) 100-25 MCG/INH AEPB    predniSONE (DELTASONE) 20 MG tablet    azithromycin (ZITHROMAX) 250 MG tablet       Follow up plan: Return in about 1 week (around 02/20/2016), or if symptoms worsen or fail to improve, for Recheck breathing.  Counseling provided for all of the vaccine components No orders of the defined types were placed in this encounter.    Caryl Pina, MD [facility] 02/13/2016, 4:07 PM

## 2016-02-20 ENCOUNTER — Ambulatory Visit (INDEPENDENT_AMBULATORY_CARE_PROVIDER_SITE_OTHER): Payer: Commercial Managed Care - HMO | Admitting: Family Medicine

## 2016-02-20 ENCOUNTER — Ambulatory Visit (INDEPENDENT_AMBULATORY_CARE_PROVIDER_SITE_OTHER): Payer: Commercial Managed Care - HMO

## 2016-02-20 ENCOUNTER — Encounter: Payer: Self-pay | Admitting: Family Medicine

## 2016-02-20 VITALS — BP 137/79 | HR 72 | Temp 97.3°F | Ht 62.0 in | Wt 182.8 lb

## 2016-02-20 DIAGNOSIS — J209 Acute bronchitis, unspecified: Secondary | ICD-10-CM | POA: Diagnosis not present

## 2016-02-20 MED ORDER — PREDNISONE 20 MG PO TABS: ORAL_TABLET | ORAL | Status: DC

## 2016-02-20 NOTE — Progress Notes (Signed)
BP 137/79 mmHg  Pulse 72  Temp(Src) 97.3 F (36.3 C) (Oral)  Ht '5\' 2"'$  (1.575 m)  Wt 182 lb 12.8 oz (82.918 kg)  BMI 33.43 kg/m2  SpO2 98%   Subjective:    Patient ID: Yvonne [name], female    DOB: [birth year], 65 y.o.   MRN: [id number]  HPI: Yvonne [name] is a 65 y.o. female presenting on 02/20/2016 for Sinus congestion and pressure in ears; Wheezing; and Cough   HPI Sinus congestion and ear congestion and pressure Patient has been having sinus congestion and ear congestion and pressure that is still been going on since she was here a week ago. She was here and given Rocephin and Depo-Medrol and 5 days worth of prednisone and azithromycin. She denies any fevers or chills. She says she is still having some wheezing but denies shortness of breath. She says she is feeling better but she still feels like she has a lot of sinus congestion and ear pressure area and she says she can't use Flonase because it comes out her eyes. Recommended using Netty pot or nasal saline. She has been using her inhalers and feels like they are helping some.  Relevant past medical, surgical, family and social history reviewed and updated as indicated. Interim medical history since our last visit reviewed. Allergies and medications reviewed and updated.  Review of Systems  Constitutional: Negative for fever and chills.  HENT: Positive for congestion, postnasal drip, rhinorrhea, sinus pressure, sneezing and sore throat. Negative for ear discharge and ear pain.   Eyes: Negative for pain, redness and visual disturbance.  Respiratory: Positive for cough and wheezing. Negative for chest tightness and shortness of breath.   Cardiovascular: Negative for chest pain and leg swelling.  Endocrine: Negative for cold intolerance and heat intolerance.  Genitourinary: Negative for dysuria and difficulty urinating.  Musculoskeletal: Negative for back pain and gait problem.  Skin: Negative for rash.  Neurological: Negative for  dizziness, light-headedness and headaches.  Psychiatric/Behavioral: Negative for behavioral problems and agitation.  All other systems reviewed and are negative.  Per HPI unless specifically indicated above    Medication List       This list is accurate as of: 02/20/16  8:19 AM.  Always use your most recent med list.               albuterol (2.5 MG/3ML) 0.083% nebulizer solution  Commonly known as:  PROVENTIL  Take 2.5 mg by nebulization every 6 (six) hours as needed for wheezing or shortness of breath. Reported on 02/07/2016     albuterol 108 (90 Base) MCG/ACT inhaler  Commonly known as:  PROVENTIL HFA;VENTOLIN HFA  Inhale 2 puffs into the lungs every 6 (six) hours as needed for wheezing or shortness of breath.     ALPRAZolam 1 MG tablet  Commonly known as:  XANAX  Take 1 tablet (1 mg total) by mouth 3 (three) times daily. TAKE (1) TABLET THREE TIMES DAILY.     amLODipine 10 MG tablet  Commonly known as:  NORVASC  Take 0.5 tablets (5 mg total) by mouth daily.     atorvastatin 80 MG tablet  Commonly known as:  LIPITOR  Take 1 tablet (80 mg total) by mouth daily.     blood glucose meter kit and supplies Kit  Dispense based on patient and insurance preference. Use up to four times daily as directed. (FOR ICD-9 250.00, 250.01).     calcium citrate-vitamin D 315-200 MG-UNIT tablet  Commonly  known as:  CALCIUM CITRATE + D  Take 2 tablets by mouth daily.     ezetimibe 10 MG tablet  Commonly known as:  ZETIA  Take 1 tablet (10 mg total) by mouth daily. For cholesterol     fluticasone furoate-vilanterol 100-25 MCG/INH Aepb  Commonly known as:  BREO ELLIPTA  Inhale 1 puff into the lungs daily.     furosemide 20 MG tablet  Commonly known as:  LASIX  Take 1 tablet (20 mg total) by mouth daily.     HYDROcodone-acetaminophen 10-325 MG tablet  Commonly known as:  NORCO  Take 1 tablet by mouth every 6 (six) hours as needed for moderate pain. Reported on 02/07/2016      hyoscyamine 0.125 MG SL tablet  Commonly known as:  LEVSIN SL  Place 1 tablet (0.125 mg total) under the tongue every 4 (four) hours as needed.     ipratropium-albuterol 0.5-2.5 (3) MG/3ML Soln  Commonly known as:  DUONEB  Inhale 3 mLs into the lungs every 6 (six) hours as needed (shortness of breath). Reported on 02/07/2016     metFORMIN 500 MG 24 hr tablet  Commonly known as:  GLUCOPHAGE-XR  Take 1 tablet (500 mg total) by mouth daily with breakfast.     omeprazole 20 MG capsule  Commonly known as:  PRILOSEC  Take 1 capsule (20 mg total) by mouth daily.     oxybutynin 5 MG tablet  Commonly known as:  DITROPAN  Take 5 mg by mouth every 4 (four) hours.     predniSONE 20 MG tablet  Commonly known as:  DELTASONE  Take 3 tabs daily for 1 week, then 2 tabs daily for week 2, then 1 tab daily for week 3.     venlafaxine XR 37.5 MG 24 hr capsule  Commonly known as:  EFFEXOR XR  Take 1 capsule (37.5 mg total) by mouth at bedtime.          Objective:    BP 137/79 mmHg  Pulse 72  Temp(Src) 97.3 F (36.3 C) (Oral)  Ht 5\' 2"  (1.575 m)  Wt 182 lb 12.8 oz (82.918 kg)  BMI 33.43 kg/m2  SpO2 98%  Wt Readings from Last 3 Encounters:  02/20/16 182 lb 12.8 oz (82.918 kg)  02/13/16 185 lb 9.6 oz (84.188 kg)  02/07/16 187 lb (84.823 kg)    Physical Exam  Constitutional: She is oriented to person, place, and time. She appears well-developed and well-nourished. No distress.  HENT:  Right Ear: Tympanic membrane, external ear and ear canal normal.  Left Ear: Tympanic membrane, external ear and ear canal normal.  Nose: Mucosal edema and rhinorrhea present. No epistaxis. Right sinus exhibits no maxillary sinus tenderness and no frontal sinus tenderness. Left sinus exhibits no maxillary sinus tenderness and no frontal sinus tenderness.  Mouth/Throat: Uvula is midline and mucous membranes are normal. Posterior oropharyngeal edema and posterior oropharyngeal erythema present. No oropharyngeal  exudate or tonsillar abscesses.  Eyes: Conjunctivae and EOM are normal.  Neck: Neck supple. No thyromegaly present.  Cardiovascular: Normal rate, regular rhythm, normal heart sounds and intact distal pulses.   No murmur heard. Pulmonary/Chest: Effort normal and breath sounds normal. No respiratory distress. She has no wheezes. She has no rales. She exhibits no tenderness.  Musculoskeletal: Normal range of motion. She exhibits no edema or tenderness.  Lymphadenopathy:    She has no cervical adenopathy.  Neurological: She is alert and oriented to person, place, and time. Coordination normal.  Skin:  Skin is warm and dry. No rash noted. She is not diaphoretic.  Psychiatric: She has a normal mood and affect. Her behavior is normal.  Vitals reviewed.  Chest x-ray: No acute cardiopulmonary abnormality is noted. Await final read by radiologist.    Assessment & Plan:   Problem List Items Addressed This Visit    None    Visit Diagnoses    Acute bronchitis, unspecified organism    -  Primary    Relevant Medications    predniSONE (DELTASONE) 20 MG tablet    Other Relevant Orders    DG Chest 2 View       Follow up plan: Return in about 2 weeks (around 03/05/2016), or if symptoms worsen or fail to improve, for f/u breathing.  Counseling provided for all of the vaccine components Orders Placed This Encounter  Procedures  . DG Chest 2 View    Caryl Pina, MD [facility] 02/20/2016, 8:19 AM

## 2016-02-29 ENCOUNTER — Telehealth: Payer: Self-pay | Admitting: Family Medicine

## 2016-02-29 NOTE — Telephone Encounter (Signed)
In order to consider this patient request, the patient will need to see a provider, preferably their PCP. 

## 2016-02-29 NOTE — Telephone Encounter (Signed)
Thanks! Will take care of it now. WS 

## 2016-03-01 NOTE — Telephone Encounter (Signed)
Patients referral for [place] [facility] has been done with her [facility] and patient was informed

## 2016-03-02 NOTE — Telephone Encounter (Signed)
Patient has appointment with the pain clinic on the 12th.

## 2016-03-02 NOTE — Telephone Encounter (Signed)
In order to consider this patient request, the patient will need to see a provider, preferably their PCP. 

## 2016-03-07 DIAGNOSIS — M533 Sacrococcygeal disorders, not elsewhere classified: Secondary | ICD-10-CM | POA: Diagnosis not present

## 2016-03-07 DIAGNOSIS — M545 Low back pain: Secondary | ICD-10-CM | POA: Diagnosis not present

## 2016-03-07 DIAGNOSIS — M25551 Pain in right hip: Secondary | ICD-10-CM | POA: Diagnosis not present

## 2016-03-07 DIAGNOSIS — H35341 Macular cyst, hole, or pseudohole, right eye: Secondary | ICD-10-CM | POA: Diagnosis not present

## 2016-03-07 DIAGNOSIS — H35363 Drusen (degenerative) of macula, bilateral: Secondary | ICD-10-CM | POA: Diagnosis not present

## 2016-03-07 DIAGNOSIS — H43822 Vitreomacular adhesion, left eye: Secondary | ICD-10-CM | POA: Diagnosis not present

## 2016-03-07 DIAGNOSIS — G894 Chronic pain syndrome: Secondary | ICD-10-CM | POA: Diagnosis not present

## 2016-03-13 DIAGNOSIS — H35341 Macular cyst, hole, or pseudohole, right eye: Secondary | ICD-10-CM | POA: Diagnosis not present

## 2016-04-06 ENCOUNTER — Encounter: Payer: Self-pay | Admitting: Family Medicine

## 2016-04-06 ENCOUNTER — Ambulatory Visit (INDEPENDENT_AMBULATORY_CARE_PROVIDER_SITE_OTHER): Payer: Commercial Managed Care - HMO | Admitting: Family Medicine

## 2016-04-06 VITALS — BP 140/88 | HR 78 | Temp 98.1°F | Ht 62.0 in | Wt 185.0 lb

## 2016-04-06 DIAGNOSIS — J441 Chronic obstructive pulmonary disease with (acute) exacerbation: Secondary | ICD-10-CM | POA: Insufficient documentation

## 2016-04-06 MED ORDER — PREDNISONE 20 MG PO TABS: ORAL_TABLET | ORAL | Status: DC

## 2016-04-06 NOTE — Progress Notes (Signed)
BP 140/88 mmHg  Pulse 78  Temp(Src) 98.1 F (36.7 C) (Oral)  Ht '5\' 2"'$  (1.575 m)  Wt 185 lb (83.915 kg)  BMI 33.83 kg/m2   Subjective:    Patient ID: Yvonne [name], female    DOB: April 30, 1951, 65 y.o.   MRN: [id number]  HPI: Yvonne [name] is a 65 y.o. female presenting on 04/06/2016 for Sinusitis; Cough; and Medication Refill   HPI Cough and congestion and sinus congestion Patient is coming in today for cough and chest congestion and sinus congestion that's been going on for the past week. She denies any fevers or chills or shortness of breath but does think she may have had some wheezing. She denies any sick contacts that she knows of. She still does live with for smokers and has COPD and inhalers. She continues to use her Brio which is helped her not have to use her albuterol as much. Since she started her Briones is the first time that she's had problems like this.  Relevant past medical, surgical, family and social history reviewed and updated as indicated. Interim medical history since our last visit reviewed. Allergies and medications reviewed and updated.  Review of Systems  Constitutional: Negative for fever and chills.  HENT: Positive for congestion, postnasal drip, rhinorrhea, sinus pressure, sneezing and sore throat. Negative for ear discharge and ear pain.   Eyes: Negative for pain, redness and visual disturbance.  Respiratory: Positive for cough and wheezing. Negative for chest tightness and shortness of breath.   Cardiovascular: Negative for chest pain and leg swelling.  Genitourinary: Negative for dysuria and difficulty urinating.  Musculoskeletal: Negative for back pain and gait problem.  Skin: Negative for rash.  Neurological: Negative for light-headedness and headaches.  Psychiatric/Behavioral: Negative for behavioral problems and agitation.  All other systems reviewed and are negative.   Per HPI unless specifically indicated above     Medication List         This list is accurate as of: 04/06/16  2:22 PM.  Always use your most recent med list.               albuterol (2.5 MG/3ML) 0.083% nebulizer solution  Commonly known as:  PROVENTIL  Take 2.5 mg by nebulization every 6 (six) hours as needed for wheezing or shortness of breath. Reported on 02/07/2016     albuterol 108 (90 Base) MCG/ACT inhaler  Commonly known as:  PROVENTIL HFA;VENTOLIN HFA  Inhale 2 puffs into the lungs every 6 (six) hours as needed for wheezing or shortness of breath.     ALPRAZolam 1 MG tablet  Commonly known as:  XANAX  Take 1 tablet (1 mg total) by mouth 3 (three) times daily. TAKE (1) TABLET THREE TIMES DAILY.     amLODipine 10 MG tablet  Commonly known as:  NORVASC  Take 0.5 tablets (5 mg total) by mouth daily.     atorvastatin 80 MG tablet  Commonly known as:  LIPITOR  Take 1 tablet (80 mg total) by mouth daily.     blood glucose meter kit and supplies Kit  Dispense based on patient and insurance preference. Use up to four times daily as directed. (FOR ICD-9 250.00, 250.01).     calcium citrate-vitamin D 315-200 MG-UNIT tablet  Commonly known as:  CALCIUM CITRATE + D  Take 2 tablets by mouth daily.     ezetimibe 10 MG tablet  Commonly known as:  ZETIA  Take 1 tablet (10 mg total) by mouth  daily. For cholesterol     fluticasone furoate-vilanterol 100-25 MCG/INH Aepb  Commonly known as:  BREO ELLIPTA  Inhale 1 puff into the lungs daily.     furosemide 20 MG tablet  Commonly known as:  LASIX  Take 1 tablet (20 mg total) by mouth daily.     HYDROcodone-acetaminophen 10-325 MG tablet  Commonly known as:  NORCO  Take 1 tablet by mouth every 6 (six) hours as needed for moderate pain. Reported on 02/07/2016     hyoscyamine 0.125 MG SL tablet  Commonly known as:  LEVSIN SL  Place 1 tablet (0.125 mg total) under the tongue every 4 (four) hours as needed.     ipratropium-albuterol 0.5-2.5 (3) MG/3ML Soln  Commonly known as:  DUONEB  Inhale 3 mLs into  the lungs every 6 (six) hours as needed (shortness of breath). Reported on 02/07/2016     metFORMIN 500 MG 24 hr tablet  Commonly known as:  GLUCOPHAGE-XR  Take 1 tablet (500 mg total) by mouth daily with breakfast.     omeprazole 20 MG capsule  Commonly known as:  PRILOSEC  Take 1 capsule (20 mg total) by mouth daily.     oxybutynin 5 MG tablet  Commonly known as:  DITROPAN  Take 5 mg by mouth every 4 (four) hours. Reported on 04/06/2016     predniSONE 20 MG tablet  Commonly known as:  DELTASONE  2 po at same time daily for 5 days     venlafaxine XR 37.5 MG 24 hr capsule  Commonly known as:  EFFEXOR XR  Take 1 capsule (37.5 mg total) by mouth at bedtime.           Objective:    BP 140/88 mmHg  Pulse 78  Temp(Src) 98.1 F (36.7 C) (Oral)  Ht '5\' 2"'$  (1.575 m)  Wt 185 lb (83.915 kg)  BMI 33.83 kg/m2  Wt Readings from Last 3 Encounters:  04/06/16 185 lb (83.915 kg)  02/20/16 182 lb 12.8 oz (82.918 kg)  02/13/16 185 lb 9.6 oz (84.188 kg)    Physical Exam  Constitutional: She is oriented to person, place, and time. She appears well-developed and well-nourished. No distress.  HENT:  Right Ear: Tympanic membrane, external ear and ear canal normal.  Left Ear: Tympanic membrane, external ear and ear canal normal.  Nose: Mucosal edema and rhinorrhea present. No epistaxis. Right sinus exhibits no maxillary sinus tenderness and no frontal sinus tenderness. Left sinus exhibits no maxillary sinus tenderness and no frontal sinus tenderness.  Mouth/Throat: Uvula is midline and mucous membranes are normal. Posterior oropharyngeal edema and posterior oropharyngeal erythema present. No oropharyngeal exudate or tonsillar abscesses.  Eyes: Conjunctivae and EOM are normal.  Neck: Neck supple. No thyromegaly present.  Cardiovascular: Normal rate, regular rhythm, normal heart sounds and intact distal pulses.   No murmur heard. Pulmonary/Chest: Effort normal and breath sounds normal. No  respiratory distress. She has no wheezes. She has no rales. She exhibits no tenderness.  Musculoskeletal: Normal range of motion. She exhibits no edema or tenderness.  Lymphadenopathy:    She has no cervical adenopathy.  Neurological: She is alert and oriented to person, place, and time. Coordination normal.  Skin: Skin is warm and dry. No rash noted. She is not diaphoretic.  Psychiatric: She has a normal mood and affect. Her behavior is normal.  Vitals reviewed.     Assessment & Plan:   Problem List Items Addressed This Visit      Respiratory  COPD exacerbation ([place]) - Primary   Relevant Medications   predniSONE (DELTASONE) 20 MG tablet      Follow up plan: Return if symptoms worsen or fail to improve.  Counseling provided for all of the vaccine components No orders of the defined types were placed in this encounter.    Caryl Pina, MD [facility] 04/06/2016, 2:22 PM

## 2016-04-16 DIAGNOSIS — M79671 Pain in right foot: Secondary | ICD-10-CM | POA: Diagnosis not present

## 2016-04-16 DIAGNOSIS — M79672 Pain in left foot: Secondary | ICD-10-CM | POA: Diagnosis not present

## 2016-04-16 DIAGNOSIS — G894 Chronic pain syndrome: Secondary | ICD-10-CM | POA: Diagnosis not present

## 2016-04-16 DIAGNOSIS — M47816 Spondylosis without myelopathy or radiculopathy, lumbar region: Secondary | ICD-10-CM | POA: Diagnosis not present

## 2016-04-16 DIAGNOSIS — H43822 Vitreomacular adhesion, left eye: Secondary | ICD-10-CM | POA: Diagnosis not present

## 2016-04-16 DIAGNOSIS — Z5181 Encounter for therapeutic drug level monitoring: Secondary | ICD-10-CM | POA: Diagnosis not present

## 2016-04-16 DIAGNOSIS — Z79899 Other long term (current) drug therapy: Secondary | ICD-10-CM | POA: Diagnosis not present

## 2016-04-16 DIAGNOSIS — H35363 Drusen (degenerative) of macula, bilateral: Secondary | ICD-10-CM | POA: Diagnosis not present

## 2016-04-30 ENCOUNTER — Telehealth: Payer: Self-pay | Admitting: *Deleted

## 2016-04-30 ENCOUNTER — Encounter: Payer: Self-pay | Admitting: Family Medicine

## 2016-04-30 ENCOUNTER — Ambulatory Visit (INDEPENDENT_AMBULATORY_CARE_PROVIDER_SITE_OTHER): Payer: Commercial Managed Care - HMO | Admitting: Family Medicine

## 2016-04-30 VITALS — BP 134/78 | HR 75 | Temp 97.7°F | Ht 62.0 in | Wt 188.2 lb

## 2016-04-30 DIAGNOSIS — I10 Essential (primary) hypertension: Secondary | ICD-10-CM | POA: Diagnosis not present

## 2016-04-30 DIAGNOSIS — E785 Hyperlipidemia, unspecified: Secondary | ICD-10-CM

## 2016-04-30 DIAGNOSIS — E669 Obesity, unspecified: Secondary | ICD-10-CM | POA: Diagnosis not present

## 2016-04-30 DIAGNOSIS — E119 Type 2 diabetes mellitus without complications: Secondary | ICD-10-CM | POA: Diagnosis not present

## 2016-04-30 DIAGNOSIS — K219 Gastro-esophageal reflux disease without esophagitis: Secondary | ICD-10-CM | POA: Diagnosis not present

## 2016-04-30 MED ORDER — ALPRAZOLAM 1 MG PO TABS: 1.0000 mg | ORAL_TABLET | Freq: Three times a day (TID) | ORAL | Status: DC

## 2016-04-30 MED ORDER — VENLAFAXINE HCL ER 75 MG PO CP24: 75.0000 mg | ORAL_CAPSULE | Freq: Every day | ORAL | Status: DC

## 2016-04-30 MED ORDER — SOLIFENACIN SUCCINATE 10 MG PO TABS: 10.0000 mg | ORAL_TABLET | Freq: Every day | ORAL | Status: DC

## 2016-04-30 MED ORDER — OXYBUTYNIN 3.9 MG/24HR TD PTTW: 1.0000 | MEDICATED_PATCH | TRANSDERMAL | Status: DC

## 2016-04-30 NOTE — Progress Notes (Signed)
Subjective:  Patient ID: Yvonne [name], female    DOB: 1951-02-16  Age: 65 y.o. MRN: [id number]  CC: GAD; Hypertension; and Diabetes   HPI Yvonne [name] presents for  follow-up of hypertension. Patient has no history of headache chest pain or shortness of breath or recent cough. Patient also denies symptoms of TIA such as numbness weakness lateralizing. Patient checks  blood pressure at home and has not had any elevated readings recently. Patient denies side effects from his medication. States taking it regularly.  Patient also  in for follow-up of elevated cholesterol. Doing well without complaints on current medication. Denies side effects of statin including myalgia and arthralgia and nausea. Also in today for liver function testing. Currently no chest pain, shortness of breath or other cardiovascular related symptoms noted.  Follow-up of diabetes. Patient does check blood sugar at home. Readings run between 130 and 170. May go higher if eating too much junk food. Patient denies symptoms such as polyuria, polydipsia, excessive hunger, nausea No significant hypoglycemic spells noted. Medications as noted below. Taking them regularly without complication/adverse reaction being reported today. She is concerned about the safety of metformin based on television reports.   History Yvonne [name] has a past medical history of Overactive bladder; Hypertension; Asthma; Chronic bronchitis ([place]); Anxiety; Chronic leg pain; Hyperlipidemia; Clavicle fracture; Cataract; and Diabetes mellitus without complication ([place]).   She has past surgical history that includes Abdominal hysterectomy; Cholecystectomy; tear duct surgery (Bilateral); Colonoscopy; and Colonoscopy (N/A, 08/17/2015).   Her family history includes Arthritis in her brother and mother; Cancer in her brother; Diabetes in her father and mother; Heart disease in her father; Hepatitis C in her sister.She reports that she has never smoked. She has never  used smokeless tobacco. She reports that she does not drink alcohol or use illicit drugs.  Current Outpatient Prescriptions on File Prior to Visit  Medication Sig Dispense Refill  . albuterol (PROVENTIL HFA;VENTOLIN HFA) 108 (90 Base) MCG/ACT inhaler Inhale 2 puffs into the lungs every 6 (six) hours as needed for wheezing or shortness of breath. 1 Inhaler 2  . albuterol (PROVENTIL) (2.5 MG/3ML) 0.083% nebulizer solution Take 2.5 mg by nebulization every 6 (six) hours as needed for wheezing or shortness of breath. Reported on 02/07/2016    . amLODipine (NORVASC) 10 MG tablet Take 0.5 tablets (5 mg total) by mouth daily. 90 tablet 3  . atorvastatin (LIPITOR) 80 MG tablet Take 1 tablet (80 mg total) by mouth daily. 90 tablet 1  . blood glucose meter kit and supplies KIT Dispense based on patient and insurance preference. Use up to four times daily as directed. (FOR ICD-9 250.00, 250.01). 1 each 0  . calcium citrate-vitamin D (CALCIUM CITRATE + D) 315-200 MG-UNIT per tablet Take 2 tablets by mouth daily.    [name] Kitchen ezetimibe (ZETIA) 10 MG tablet Take 1 tablet (10 mg total) by mouth daily. For cholesterol 90 tablet 3  . fluticasone furoate-vilanterol (BREO ELLIPTA) 100-25 MCG/INH AEPB Inhale 1 puff into the lungs daily. 30 each 2  . furosemide (LASIX) 20 MG tablet Take 1 tablet (20 mg total) by mouth daily. 90 tablet 1  . HYDROcodone-acetaminophen (NORCO) 10-325 MG tablet Take 1 tablet by mouth every 6 (six) hours as needed for moderate pain. Reported on 02/07/2016 60 tablet 0  . hyoscyamine (LEVSIN SL) 0.125 MG SL tablet Place 1 tablet (0.125 mg total) under the tongue every 4 (four) hours as needed. 30 tablet 0  . ipratropium-albuterol (DUONEB) 0.5-2.5 (3) MG/3ML  SOLN Inhale 3 mLs into the lungs every 6 (six) hours as needed (shortness of breath). Reported on 02/07/2016    . metFORMIN (GLUCOPHAGE-XR) 500 MG 24 hr tablet Take 1 tablet (500 mg total) by mouth daily with breakfast. 30 tablet 3  . omeprazole  (PRILOSEC) 20 MG capsule Take 1 capsule (20 mg total) by mouth daily. 90 capsule 1   No current facility-administered medications on file prior to visit.    ROS Review of Systems  Constitutional: Negative for fever, activity change and appetite change.  HENT: Negative for congestion, rhinorrhea and sore throat.   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 Macular degeneration OS) and visual disturbance.  Respiratory: Negative for cough and shortness of breath.   Cardiovascular: Negative for chest pain and palpitations.  Gastrointestinal: Negative for nausea, abdominal pain and diarrhea.  Genitourinary: Negative for dysuria.  Musculoskeletal: Negative for myalgias and arthralgias.  Psychiatric/Behavioral: The patient is nervous/anxious (related to inability to drive read or perform other activities requiring clear site. There is hope for improvement in her vision but currently it is very poor.).     Objective:  BP 134/78 mmHg  Pulse 75  Temp(Src) 97.7 F (36.5 C) (Oral)  Ht '5\' 2"'$  (1.575 m)  Wt 188 lb 3.2 oz (85.367 kg)  BMI 34.41 kg/m2  SpO2 100%  BP Readings from Last 3 Encounters:  04/30/16 134/78  04/06/16 140/88  02/20/16 137/79    Wt Readings from Last 3 Encounters:  04/30/16 188 lb 3.2 oz (85.367 kg)  04/06/16 185 lb (83.915 kg)  02/20/16 182 lb 12.8 oz (82.918 kg)     Physical Exam  Constitutional: She is oriented to person, place, and time. She appears well-developed and well-nourished. No distress.  HENT:  Head: Normocephalic and atraumatic.  Eyes: Pupils are equal, round, and reactive to light. Right eye exhibits chemosis. Left eye exhibits chemosis. Right conjunctiva is injected. Left conjunctiva is injected.  Neck: Normal range of motion. Neck supple. No thyromegaly present.  Cardiovascular: Normal rate, regular rhythm and normal heart sounds.   No murmur heard. Pulmonary/Chest: Effort normal and breath sounds normal. No  respiratory distress. She has no wheezes. She has no rales.  Musculoskeletal: Normal range of motion.  Lymphadenopathy:    She has no cervical adenopathy.  Neurological: She is alert and oriented to person, place, and time.  Skin: Skin is warm and dry.  Psychiatric: She has a normal mood and affect. Her behavior is normal. Judgment and thought content normal.    Lab Results  Component Value Date   HGBA1C 6.9 11/04/2015   HGBA1C 6.8 07/20/2015    Lab Results  Component Value Date   WBC 5.6 02/07/2016   HGB 13.3 06/03/2015   HCT 39.6 02/07/2016   PLT 249 02/07/2016   GLUCOSE 140* 02/07/2016   CHOL 202* 02/07/2016   TRIG 149 02/07/2016   HDL 49 02/07/2016   LDLCALC 123* 02/07/2016   ALT 14 02/07/2016   AST 15 02/07/2016   NA 142 02/07/2016   K 4.3 02/07/2016   CL 102 02/07/2016   CREATININE 0.77 02/07/2016   BUN 15 02/07/2016   CO2 25 02/07/2016   TSH 1.580 06/03/2015   HGBA1C 6.9 11/04/2015    Mm Screening Breast Tomo Bilateral  01/20/2016  CLINICAL DATA:  Screening. EXAM: DIGITAL SCREENING BILATERAL MAMMOGRAM WITH 3D TOMO WITH CAD COMPARISON:  Previous exam(s). ACR Breast Density Category b: There are scattered areas of fibroglandular density.  FINDINGS: There are no findings suspicious for malignancy. Images were processed with CAD. IMPRESSION: No mammographic evidence of malignancy. A result letter of this screening mammogram will be mailed directly to the patient. RECOMMENDATION: Screening mammogram in one year. (Code:[id number]-01Y) BI-RADS CATEGORY  1: Negative. Electronically Signed   By: Abelardo Diesel M.D.   On: 01/20/2016 12:14    Assessment & Plan:   Ellon was seen today for gad, hypertension and diabetes.  Diagnoses and all orders for this visit:  Hyperlipidemia with target LDL less than 70  Essential hypertension  Type 2 diabetes mellitus without complication, without long-term current use of insulin (HCC) -     Bayer DCA Hb A1c Waived;  Future  Gastroesophageal reflux disease without esophagitis  Obesity  Other orders -     oxybutynin (OXYTROL) 3.9 MG/24HR; Place 1 patch onto the skin 4 days. -     venlafaxine XR (EFFEXOR-XR) 75 MG 24 hr capsule; Take 1 capsule (75 mg total) by mouth daily with breakfast. -     ALPRAZolam (XANAX) 1 MG tablet; Take 1 tablet (1 mg total) by mouth 3 (three) times daily. TAKE (1) TABLET THREE TIMES DAILY.  I have discontinued Ms. Wilbert's oxybutynin and predniSONE. I have also changed her venlafaxine XR. Additionally, I am having her start on oxybutynin. Lastly, I am having her maintain her albuterol, ipratropium-albuterol, furosemide, omeprazole, hyoscyamine, calcium citrate-vitamin D, metFORMIN, blood glucose meter kit and supplies, atorvastatin, amLODipine, HYDROcodone-acetaminophen, ezetimibe, albuterol, fluticasone furoate-vilanterol, and ALPRAZolam.  Meds ordered this encounter  Medications  . oxybutynin (OXYTROL) 3.9 MG/24HR    Sig: Place 1 patch onto the skin 4 days.    Dispense:  8 patch    Refill:  11  . venlafaxine XR (EFFEXOR-XR) 75 MG 24 hr capsule    Sig: Take 1 capsule (75 mg total) by mouth daily with breakfast.    Dispense:  30 capsule    Refill:  2  . ALPRAZolam (XANAX) 1 MG tablet    Sig: Take 1 tablet (1 mg total) by mouth 3 (three) times daily. TAKE (1) TABLET THREE TIMES DAILY.    Dispense:  90 tablet    Refill:  5     Follow-up: Return in about 3 months (around 07/31/2016) for diabetes, COPD.  [name], M.D.

## 2016-04-30 NOTE — Telephone Encounter (Signed)
The requested med has been sent to the pharmacy.  Please let the patient know. Thanks, WS 

## 2016-05-01 NOTE — Telephone Encounter (Signed)
Pt notified of RX 

## 2016-05-03 ENCOUNTER — Other Ambulatory Visit: Payer: Commercial Managed Care - HMO

## 2016-05-03 DIAGNOSIS — I10 Essential (primary) hypertension: Secondary | ICD-10-CM | POA: Diagnosis not present

## 2016-05-03 DIAGNOSIS — Z139 Encounter for screening, unspecified: Secondary | ICD-10-CM

## 2016-05-03 DIAGNOSIS — E119 Type 2 diabetes mellitus without complications: Secondary | ICD-10-CM

## 2016-05-03 DIAGNOSIS — N3281 Overactive bladder: Secondary | ICD-10-CM

## 2016-05-04 ENCOUNTER — Telehealth: Payer: Self-pay | Admitting: Family Medicine

## 2016-05-04 LAB — CMP14+EGFR
ALK PHOS: 123 IU/L — AB (ref 39–117)
ALT: 24 IU/L (ref 0–32)
AST: 18 IU/L (ref 0–40)
Albumin/Globulin Ratio: 1.7 (ref 1.2–2.2)
Albumin: 4 g/dL (ref 3.6–4.8)
BUN/Creatinine Ratio: 25 (ref 12–28)
BUN: 23 mg/dL (ref 8–27)
Bilirubin Total: 0.5 mg/dL (ref 0.0–1.2)
CALCIUM: 9.3 mg/dL (ref 8.7–10.3)
CO2: 23 mmol/L (ref 18–29)
CREATININE: 0.93 mg/dL (ref 0.57–1.00)
Chloride: 98 mmol/L (ref 96–106)
GFR calc Af Amer: 74 mL/min/{1.73_m2} (ref >59)
GFR, EST NON AFRICAN AMERICAN: 64 mL/min/{1.73_m2} (ref >59)
GLOBULIN, TOTAL: 2.3 g/dL (ref 1.5–4.5)
GLUCOSE: 228 mg/dL — AB (ref 65–99)
Potassium: 4.7 mmol/L (ref 3.5–5.2)
SODIUM: 137 mmol/L (ref 134–144)
Total Protein: 6.3 g/dL (ref 6.0–8.5)

## 2016-05-04 LAB — LIPID PANEL
CHOL/HDL RATIO: 3.5 ratio (ref 0.0–4.4)
CHOLESTEROL TOTAL: 183 mg/dL (ref 100–199)
HDL: 52 mg/dL (ref >39)
LDL CALC: 109 mg/dL — AB (ref 0–99)
TRIGLYCERIDES: 112 mg/dL (ref 0–149)
VLDL CHOLESTEROL CAL: 22 mg/dL (ref 5–40)

## 2016-05-04 LAB — VITAMIN D 25 HYDROXY (VIT D DEFICIENCY, FRACTURES): Vit D, 25-Hydroxy: 14.9 ng/mL — ABNORMAL LOW (ref 30.0–100.0)

## 2016-05-04 MED ORDER — VITAMIN D (ERGOCALCIFEROL) 1.25 MG (50000 UNIT) PO CAPS: 50000.0000 [IU] | ORAL_CAPSULE | ORAL | Status: DC

## 2016-05-04 NOTE — Telephone Encounter (Signed)
Patient aware of results and rx for vitamin d sent to pharmacy.

## 2016-05-07 ENCOUNTER — Ambulatory Visit: Payer: Commercial Managed Care - HMO | Admitting: Pharmacist

## 2016-05-07 DIAGNOSIS — E119 Type 2 diabetes mellitus without complications: Secondary | ICD-10-CM | POA: Diagnosis not present

## 2016-05-07 LAB — BAYER DCA HB A1C WAIVED: HB A1C (BAYER DCA - WAIVED): 7.8 % — ABNORMAL HIGH (ref <7.0)

## 2016-05-10 ENCOUNTER — Ambulatory Visit (INDEPENDENT_AMBULATORY_CARE_PROVIDER_SITE_OTHER): Payer: Commercial Managed Care - HMO | Admitting: Family Medicine

## 2016-05-10 ENCOUNTER — Ambulatory Visit: Payer: Commercial Managed Care - HMO | Admitting: Family Medicine

## 2016-05-10 ENCOUNTER — Encounter: Payer: Self-pay | Admitting: Family Medicine

## 2016-05-10 VITALS — BP 138/82 | HR 78 | Temp 97.3°F | Ht 62.0 in | Wt 187.2 lb

## 2016-05-10 DIAGNOSIS — J0101 Acute recurrent maxillary sinusitis: Secondary | ICD-10-CM | POA: Diagnosis not present

## 2016-05-10 DIAGNOSIS — J029 Acute pharyngitis, unspecified: Secondary | ICD-10-CM

## 2016-05-10 DIAGNOSIS — R05 Cough: Secondary | ICD-10-CM

## 2016-05-10 DIAGNOSIS — R053 Chronic cough: Secondary | ICD-10-CM

## 2016-05-10 LAB — RAPID STREP SCREEN (MED CTR MEBANE ONLY): Strep Gp A Ag, IA W/Reflex: NEGATIVE

## 2016-05-10 LAB — CULTURE, GROUP A STREP

## 2016-05-10 MED ORDER — DOXYCYCLINE HYCLATE 100 MG PO TABS: 100.0000 mg | ORAL_TABLET | Freq: Two times a day (BID) | ORAL | Status: DC

## 2016-05-10 MED ORDER — PREDNISONE 50 MG PO TABS: 50.0000 mg | ORAL_TABLET | Freq: Every day | ORAL | Status: DC

## 2016-05-10 NOTE — Progress Notes (Signed)
   HPI  Patient presents today here with acute illness.  Patient explains that over the last 5-7 days she's had sore throat, ear pain, cough, chills, and sweats. She's also has shortness of breath.  She's had worsening symptoms over the last 12 hours. She states that she has not been treated for bronchitis in quite a while  She's tolerating food and fluids normally. She denies chest pain.  She denies fever.  She has had chills and sweats at night for a few nights in a row.  PMH: Smoking status noted ROS: Per HPI  Objective: BP 138/82 mmHg  Pulse 78  Temp(Src) 97.3 F (36.3 C) (Oral)  Ht 5\' 2"  (1.575 m)  Wt 187 lb 3.2 oz (84.913 kg)  BMI 34.23 kg/m2 Gen: NAD, alert, cooperative with exam HEENT: NCAT, tenderness to palpation of bilateral maxillary sinuses right slightly worse than the left, no tenderness of the frontal sinuses, TMs normal bilaterally, oropharynx clear CV: RRR, good S1/S2, no murmur Resp: Nonlabored, expiratory wheezes scattered Ext: No edema, warm Neuro: Alert and oriented, No gross deficits  Assessment and plan:  # Acute sinusitis, acute bronchitis She has recurrent bronchitis, uncovering for sinus infection as well today with doxycycline. She has recurrent bronchitis, suspicious of underlying chronic lung disease I have emphasized the importance of her getting better long-term treatment and avoiding recurrent steroid and antibiotic combinations I have referred her to pulmonology, I gave her a choice between PFTs in 3-4 weeks here or referral, she would like to just be referred. Prednisone 50 mg daily 5 days, doxycycline Continue daily antihistamine Return to clinic with any concerns, worsening symptoms, or failure to improve as expected.  Chart reviewed she's had over 4 bursts steroids in the last 6 months as well to other courses of antibiotics, this is the third. She does have T2DM   Laroy Apple, MD [facility] 05/10/2016,  11:45 AM

## 2016-05-10 NOTE — Patient Instructions (Signed)
Great to see you!  I am very suspicious for an underlying lung problem, you have been treated several times recently with steroids and antibiotics, this is not a pattern we want to keep you in.   Please come back in 3-4 weeks for pulmonary function testing  Sinusitis, Adult Sinusitis is redness, soreness, and puffiness (inflammation) of the air pockets in the bones of your face (sinuses). The redness, soreness, and puffiness can cause air and mucus to get trapped in your sinuses. This can allow germs to grow and cause an infection.  HOME CARE   Drink enough fluids to keep your pee (urine) clear or pale yellow.  Use a humidifier in your home.  Run a hot shower to create steam in the bathroom. Sit in the bathroom with the door closed. Breathe in the steam 3-4 times a day.  Put a warm, moist washcloth on your face 3-4 times a day, or as told by your doctor.  Use salt water sprays (saline sprays) to wet the thick fluid in your nose. This can help the sinuses drain.  Only take medicine as told by your doctor. GET HELP RIGHT AWAY IF:   Your pain gets worse.  You have very bad headaches.  You are sick to your stomach (nauseous).  You throw up (vomit).  You are very sleepy (drowsy) all the time.  Your face is puffy (swollen).  Your vision changes.  You have a stiff neck.  You have trouble breathing. MAKE SURE YOU:   Understand these instructions.  Will watch your condition.  Will get help right away if you are not doing well or get worse.   This information is not intended to replace advice given to you by your health care provider. Make sure you discuss any questions you have with your health care provider.   Document Released: 04/30/2008 Document Revised: 12/03/2014 Document Reviewed: 06/17/2012 Elsevier Interactive Patient Education [employer].

## 2016-05-17 ENCOUNTER — Telehealth: Payer: Self-pay | Admitting: Family Medicine

## 2016-05-17 ENCOUNTER — Telehealth: Payer: Self-pay | Admitting: Internal Medicine

## 2016-05-17 ENCOUNTER — Encounter: Payer: Self-pay | Admitting: Internal Medicine

## 2016-05-17 ENCOUNTER — Ambulatory Visit (INDEPENDENT_AMBULATORY_CARE_PROVIDER_SITE_OTHER): Payer: Commercial Managed Care - HMO | Admitting: Internal Medicine

## 2016-05-17 VITALS — BP 148/78 | HR 71 | Temp 97.2°F | Ht 62.0 in | Wt 188.8 lb

## 2016-05-17 DIAGNOSIS — R05 Cough: Secondary | ICD-10-CM

## 2016-05-17 DIAGNOSIS — J3489 Other specified disorders of nose and nasal sinuses: Secondary | ICD-10-CM | POA: Diagnosis not present

## 2016-05-17 DIAGNOSIS — R053 Chronic cough: Secondary | ICD-10-CM | POA: Insufficient documentation

## 2016-05-17 LAB — NITRIC OXIDE: NITRIC OXIDE: 6

## 2016-05-17 NOTE — Patient Instructions (Addendum)
ICD-9-CM ICD-10-CM   1. Chronic cough 786.2 R05   2. Sinus drainage 478.19 J34.89    Unclear why you have so much cough Usually due to more than 1 reason  Plan - refer ENT Dr Vicie Mutters at your request  - CT sinus wo contrast  - at [place] chest wo contrast - at [facility] - Full PFT at [facility] - continue breo and other medications as before without change  followup  - return to seee me or my NP Tammy next few weeks but after completions  Of above

## 2016-05-17 NOTE — Progress Notes (Signed)
Subjective:     Patient ID: Yvonne [name], female   DOB: 1951/11/08, 65 y.o.   MRN: [id number]  PCP Yvonne Fraise, MD   HPI  IOV 05/17/2016  Chief Complaint  Patient presents with  . Pulmonary Consult    Ref. by Dr. Efraim [name] cough x 2-3 yrs.green x 2 wks,getting worse,worse with humidity and exercise,sob with exertion,wheezing,cough wakes up at night,chest pain mid-chest occass.Fever 103 2 nights ago,mouth blistered.Finished Prednisone 2 days ago. on Doxycycline now.    64 year old female referred by Dr. Laroy [name] from [facility]. Patient lives in [place], [place]. She is accompanied by her daughter. The main complaint is chronic cough. History is provided by the patient and her daughter and by referral notes. She complains of chronic cough for several years. Insidious onset. It is very severe. It is present day and night. It wakes her up. Mostly dry but occasionally some sputum. In the last 1 and she's been on 5 prednisone courses for exacerbations. The prednisone courses range from 12 a few weeks. It does improve or does not resolve the cough. Most recently treated on 05/10/2016. She's finished prednisone 2 days ago. She still on doxycycline. There is associated sinus drainage and acid reflux. She used to be on lisinopril but this was after the cough started and he had no impact on the cough while she was on it and even after she came off it. She is on Brio but she does not think this helps. This constant clearing of the throat and a feeling of ticklish sensation in the throat. There is chest tightness and has multiple other symptoms elicited on the review of systems. She has never seen ENT or had an allergy evaluation     Chest x-ray 02/20/2016 personally visualized and to me looks clear with the official report is that right pleural scarring   Feno is 6 ppbg (last prednisone dose 2d ago and currently on symbicort)  Lab ork march 2017: eos 200cells/cu  mm   Dr Yvonne [name] Reflux Symptom Index (> 13-15 suggestive of LPR cough) 0 -> 5  =  none ->severe problem  Hoarseness of problem with voice 5  Clearing  Of Throat 5  Excess throat mucus or feeling of post nasal drip 5  Difficulty swallowing food, liquid or tablets 4  Cough after eating or lying down 5  Breathing difficulties or choking episodes 4  Troublesome or annoying cough 5  Sensation of something sticking in throat or lump in throat 5  Heartburn, chest pain, indigestion, or stomach acid coming up 4  TOTAL 42      has a past medical history of Overactive bladder; Hypertension; Asthma; Chronic bronchitis ([place]); Anxiety; Chronic leg pain; Hyperlipidemia; Clavicle fracture; Cataract; Diabetes mellitus without complication ([place]); Chronic headaches; and H/O seasonal allergies.   reports that she has never smoked. She has never used smokeless tobacco.  Past Surgical History  Procedure Laterality Date  . Abdominal hysterectomy    . Cholecystectomy    . Tear duct surgery Bilateral   . Colonoscopy    . Colonoscopy N/A 08/17/2015    Procedure: COLONOSCOPY;  Surgeon: Yvonne Houston, MD;  Location: [facility];  Service: Endoscopy;  Laterality: N/A;  200 - moved to 7:30 - Ann notified pt    Allergies  Allergen Reactions  . Lyrica [Pregabalin]   . Penicillins Hives  . Lisinopril Cough    Immunization History  Administered Date(s) Administered  . Influenza Split 08/18/2015  . Pneumococcal Conjugate-13  01/06/2015    Family History  Problem Relation Age of Onset  . Arthritis Mother   . Diabetes Mother   . Diabetes Father   . Heart disease Father     CABG.  Does not know age of onset  . Hepatitis C Sister   . Arthritis Brother   . Cancer Brother     metastic cancer  . Asthma Father      Current outpatient prescriptions:  .  albuterol (PROVENTIL HFA;VENTOLIN HFA) 108 (90 Base) MCG/ACT inhaler, Inhale 2 puffs into the lungs every 6 (six) hours as needed for wheezing or  shortness of breath., Disp: 1 Inhaler, Rfl: 2 .  albuterol (PROVENTIL) (2.5 MG/3ML) 0.083% nebulizer solution, Take 2.5 mg by nebulization every 6 (six) hours as needed for wheezing or shortness of breath. Reported on 02/07/2016, Disp: , Rfl:  .  ALPRAZolam (XANAX) 1 MG tablet, Take 1 tablet (1 mg total) by mouth 3 (three) times daily. TAKE (1) TABLET THREE TIMES DAILY., Disp: 90 tablet, Rfl: 5 .  amLODipine (NORVASC) 10 MG tablet, Take 0.5 tablets (5 mg total) by mouth daily., Disp: 90 tablet, Rfl: 3 .  atorvastatin (LIPITOR) 80 MG tablet, Take 1 tablet (80 mg total) by mouth daily., Disp: 90 tablet, Rfl: 1 .  blood glucose meter kit and supplies KIT, Dispense based on patient and insurance preference. Use up to four times daily as directed. (FOR ICD-9 250.00, 250.01)., Disp: 1 each, Rfl: 0 .  calcium citrate-vitamin D (CALCIUM CITRATE + D) 315-200 MG-UNIT per tablet, Take 2 tablets by mouth daily., Disp: , Rfl:  .  doxycycline (VIBRA-TABS) 100 MG tablet, Take 1 tablet (100 mg total) by mouth 2 (two) times daily. 1 po bid, Disp: 20 tablet, Rfl: 0 .  ezetimibe (ZETIA) 10 MG tablet, Take 1 tablet (10 mg total) by mouth daily. For cholesterol, Disp: 90 tablet, Rfl: 3 .  fluticasone furoate-vilanterol (BREO ELLIPTA) 100-25 MCG/INH AEPB, Inhale 1 puff into the lungs daily., Disp: 30 each, Rfl: 2 .  furosemide (LASIX) 20 MG tablet, Take 1 tablet (20 mg total) by mouth daily., Disp: 90 tablet, Rfl: 1 .  HYDROcodone-acetaminophen (NORCO) 10-325 MG tablet, Take 1 tablet by mouth every 6 (six) hours as needed for moderate pain. Reported on 02/07/2016, Disp: 60 tablet, Rfl: 0 .  hyoscyamine (LEVSIN SL) 0.125 MG SL tablet, Place 1 tablet (0.125 mg total) under the tongue every 4 (four) hours as needed., Disp: 30 tablet, Rfl: 0 .  ipratropium-albuterol (DUONEB) 0.5-2.5 (3) MG/3ML SOLN, Inhale 3 mLs into the lungs every 6 (six) hours as needed (shortness of breath). Reported on 02/07/2016, Disp: , Rfl:  .  metFORMIN  (GLUCOPHAGE-XR) 500 MG 24 hr tablet, Take 1 tablet (500 mg total) by mouth daily with breakfast., Disp: 30 tablet, Rfl: 3 .  omeprazole (PRILOSEC) 20 MG capsule, Take 1 capsule (20 mg total) by mouth daily., Disp: 90 capsule, Rfl: 1 .  solifenacin (VESICARE) 10 MG tablet, Take 1 tablet (10 mg total) by mouth daily., Disp: 30 tablet, Rfl: 2 .  venlafaxine XR (EFFEXOR-XR) 75 MG 24 hr capsule, Take 1 capsule (75 mg total) by mouth daily with breakfast., Disp: 30 capsule, Rfl: 2 .  Vitamin D, Ergocalciferol, (DRISDOL) 50000 units CAPS capsule, Take 1 capsule (50,000 Units total) by mouth 2 (two) times a week., Disp: 16 capsule, Rfl: 0     Review of Systems  Constitutional: Positive for fever and unexpected weight change.  HENT: Positive for congestion, dental problem, ear  pain, nosebleeds, postnasal drip, rhinorrhea, sinus pressure and sore throat. Negative for sneezing and trouble swallowing.   Eyes: Negative for redness and itching.  Respiratory: Positive for chest tightness, shortness of breath and wheezing. Negative for cough.   Cardiovascular: Positive for palpitations and leg swelling.  Gastrointestinal: Negative for nausea and vomiting.  Genitourinary: Negative for dysuria.  Musculoskeletal: Negative for joint swelling.  Skin: Negative for rash.  Neurological: Positive for headaches.  Hematological: Bruises/bleeds easily.  Psychiatric/Behavioral: Positive for dysphoric mood. The patient is nervous/anxious.        Objective:   Physical Exam  Constitutional: She is oriented to person, place, and time. She appears well-developed and well-nourished. No distress.  Body mass index is 34.52 kg/(m^2). Obese  HENT:  Head: Normocephalic and atraumatic.  Right Ear: External ear normal.  Left Ear: External ear normal.  Mouth/Throat: Oropharynx is clear and moist. No oropharyngeal exudate.  Coughs periodically Laryngeal quality  Eyes: Conjunctivae and EOM are normal. Pupils are equal,  round, and reactive to light. Right eye exhibits no discharge. Left eye exhibits no discharge. No scleral icterus.  Neck: Normal range of motion. Neck supple. No JVD present. No tracheal deviation present. No thyromegaly present.  Cardiovascular: Normal rate, regular rhythm, normal heart sounds and intact distal pulses.  Exam reveals no gallop and no friction rub.   No murmur heard. Pulmonary/Chest: Effort normal and breath sounds normal. No respiratory distress. She has no wheezes. She has no rales. She exhibits no tenderness.  No wheeze  Abdominal: Soft. Bowel sounds are normal. She exhibits no distension and no mass. There is no tenderness. There is no rebound and no guarding.  Musculoskeletal: Normal range of motion. She exhibits no edema or tenderness.  Lymphadenopathy:    She has no cervical adenopathy.  Neurological: She is alert and oriented to person, place, and time. She has normal reflexes. No cranial nerve deficit. She exhibits normal muscle tone. Coordination normal.  Skin: Skin is warm and dry. No rash noted. She is not diaphoretic. No erythema. No pallor.  tatoo rt arm  Psychiatric: She has a normal mood and affect. Her behavior is normal. Judgment and thought content normal.  Vitals reviewed.   Filed Vitals:   05/17/16 0950  BP: 148/78  Pulse: 71  Temp: 97.2 F (36.2 C)  TempSrc: Oral  Height: '5\' 2"'$  (1.575 m)  Weight: 188 lb 12.8 oz (85.639 kg)  SpO2: 99%        Assessment:       ICD-9-CM ICD-10-CM   1. Chronic cough 786.2 R05 CT Chest High Resolution     Ambulatory referral to ENT     CT Maxillofacial LTD WO CM     Pulmonary function test  2. 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 Need to rule out sinus and ILD. Will get sinus CT. JHRCT, PFT and ENT referral (family knows Dr Cresenciano Lick) and  then regroupo. If idipathich consider gabapentin and speech Rx  Dr. Brand Males, M.D., [facility].C.P Pulmonary and Critical Care Medicine Staff Physician [facility] Pager: [phone], If no answer or between  15:00h - 7:00h: call [phone]  05/17/2016 10:33 AM

## 2016-05-17 NOTE — Telephone Encounter (Signed)
Ok plse let patient know and refer to [facility]NT or Dr Benjamine Mola

## 2016-05-17 NOTE — Telephone Encounter (Signed)
Received call from Dr. Thornell Mule' office at the [facility].  He says that he received a referral from Dr. Chase Caller requesting that patient be seen for voice, throat and cough, but he does not see patient's for this.  He says he only sees patient for hearing loss and implants.

## 2016-05-17 NOTE — Addendum Note (Signed)
Addended by: Mathis Bud on: 05/17/2016 11:01 AM   Modules accepted: Orders

## 2016-05-17 NOTE — Telephone Encounter (Signed)
Called patient and they said that Yvonne [name] has already scheduled her to see Dr. Constance Holster at [facility].

## 2016-05-21 DIAGNOSIS — H35341 Macular cyst, hole, or pseudohole, right eye: Secondary | ICD-10-CM | POA: Diagnosis not present

## 2016-05-21 DIAGNOSIS — H43822 Vitreomacular adhesion, left eye: Secondary | ICD-10-CM | POA: Diagnosis not present

## 2016-05-21 DIAGNOSIS — H35363 Drusen (degenerative) of macula, bilateral: Secondary | ICD-10-CM | POA: Diagnosis not present

## 2016-05-25 ENCOUNTER — Ambulatory Visit (HOSPITAL_COMMUNITY)
Admission: RE | Admit: 2016-05-25 | Discharge: 2016-05-25 | Disposition: A | Discharge status: Home / Self Care | Payer: Commercial Managed Care - HMO | Source: Ambulatory Visit | Attending: Internal Medicine | Admitting: Internal Medicine

## 2016-05-25 DIAGNOSIS — R05 Cough: Secondary | ICD-10-CM

## 2016-05-25 DIAGNOSIS — I700 Atherosclerosis of aorta: Secondary | ICD-10-CM | POA: Insufficient documentation

## 2016-05-25 DIAGNOSIS — J3489 Other specified disorders of nose and nasal sinuses: Secondary | ICD-10-CM

## 2016-05-25 DIAGNOSIS — I2510 Atherosclerotic heart disease of native coronary artery without angina pectoris: Secondary | ICD-10-CM | POA: Diagnosis not present

## 2016-05-25 DIAGNOSIS — R053 Chronic cough: Secondary | ICD-10-CM

## 2016-05-25 DIAGNOSIS — J323 Chronic sphenoidal sinusitis: Secondary | ICD-10-CM | POA: Diagnosis not present

## 2016-05-25 DIAGNOSIS — H05409 Unspecified enophthalmos, unspecified eye: Secondary | ICD-10-CM | POA: Insufficient documentation

## 2016-05-25 DIAGNOSIS — R0602 Shortness of breath: Secondary | ICD-10-CM | POA: Diagnosis not present

## 2016-05-25 DIAGNOSIS — J320 Chronic maxillary sinusitis: Secondary | ICD-10-CM | POA: Diagnosis not present

## 2016-05-25 LAB — PULMONARY FUNCTION TEST
DL/VA % PRED: 100 %
DL/VA: 4.57 ml/min/mmHg/L
DLCO unc % pred: 66 %
DLCO unc: 14.4 ml/min/mmHg
FEF 25-75 POST: 2.82 L/s
FEF 25-75 Pre: 1.03 L/sec
FEF2575-%Change-Post: 172 %
FEF2575-%PRED-POST: 144 %
FEF2575-%Pred-Pre: 53 %
FEV1-%Change-Post: 23 %
FEV1-%PRED-PRE: 60 %
FEV1-%Pred-Post: 74 %
FEV1-PRE: 1.33 L
FEV1-Post: 1.64 L
FEV1FVC-%Change-Post: 13 %
FEV1FVC-%PRED-PRE: 100 %
FEV6-%CHANGE-POST: 9 %
FEV6-%PRED-POST: 67 %
FEV6-%PRED-PRE: 61 %
FEV6-PRE: 1.71 L
FEV6-Post: 1.87 L
FEV6FVC-%Change-Post: 0 %
FEV6FVC-%PRED-POST: 104 %
FEV6FVC-%PRED-PRE: 103 %
FVC-%Change-Post: 8 %
FVC-%PRED-POST: 64 %
FVC-%PRED-PRE: 59 %
FVC-POST: 1.87 L
FVC-Pre: 1.72 L
PRE FEV6/FVC RATIO: 99 %
Post FEV1/FVC ratio: 88 %
Post FEV6/FVC ratio: 100 %
Pre FEV1/FVC ratio: 77 %
RV % PRED: 113 %
RV: 2.3 L
TLC % PRED: 86 %
TLC: 4.13 L

## 2016-05-25 MED ORDER — ALBUTEROL SULFATE (2.5 MG/3ML) 0.083% IN NEBU
2.5000 mg | INHALATION_SOLUTION | Freq: Once | RESPIRATORY_TRACT | Status: AC
  Administered 2016-05-25: 2.5 mg via RESPIRATORY_TRACT

## 2016-05-28 ENCOUNTER — Telehealth: Payer: Self-pay | Admitting: Internal Medicine

## 2016-05-28 NOTE — Telephone Encounter (Signed)
  Triage/Ashley  Let [name]   1) Ct sinis shows mild sinusitis - definitely keep appt with Dr Constance Holster  2) PFT and CT chest suggests possilbe ILD - Tammy Parrett will discuss with her end July 2017 visit -\\\\\   For Tammy Parrett:    I will make a note here for Tammy to order autoimmune and work on cough neuropathy related cough control with gabapentin but weill keep an eye on ILD for now because radiologist says is possible ILD   Ct Chest High Resolution  05/26/2016  CLINICAL DATA:  65 year old female with chronic cough, shortness of breath and sinus drainage. EXAM: CT CHEST WITHOUT CONTRAST TECHNIQUE: Multidetector CT imaging of the chest was performed following the standard protocol without intravenous contrast. High resolution imaging of the lungs, as well as inspiratory and expiratory imaging, was performed. COMPARISON:  No prior chest CT.  02/20/2016 chest radiograph. FINDINGS: Mediastinum/Nodes: Top-normal heart size. No significant pericardial fluid/thickening. Left anterior descending and right coronary atherosclerosis. Atherosclerotic nonaneurysmal thoracic aorta. Normal caliber pulmonary arteries. No discrete thyroid nodules. Unremarkable esophagus. No pathologically enlarged axillary, mediastinal or gross hilar lymph nodes, noting limited sensitivity for the detection of hilar adenopathy on this noncontrast study. Lungs/Pleura: No pneumothorax. No pleural effusion. No acute consolidative airspace disease, significant pulmonary nodules or lung masses. No significant air trapping on the expiration sequence. Minimal subpleural reticulation is seen in the dependent lungs bilaterally. No significant regions of traction bronchiectasis, parenchymal banding, ground-glass attenuation, architectural distortion or frank honeycombing. Upper abdomen: Cholecystectomy . Musculoskeletal: No aggressive appearing focal osseous lesions. Mild thoracic spondylosis. IMPRESSION: 1. Minimal subpleural  reticulation in the dependent lungs bilaterally, most likely to represent hypoventilatory change/ minimal atelectasis, although an early interstitial lung disease such as nonspecific interstitial pneumonia (NSIP) cannot be entirely excluded. Follow-up high-resolution chest CT study in 12 months with prone imaging could be performed as clinically warranted. 2. Aortic atherosclerosis. 3. Two-vessel coronary atherosclerosis. Electronically Signed   By: Ilona Sorrel M.D.   On: 05/26/2016 09:13   G[place]mith Cm  05/25/2016  CLINICAL DATA:  Chronic cough and sinus drainage. EXAM: CT PARANASAL SINUS LIMITED WITHOUT CONTRAST TECHNIQUE: Non-contiguous multidetector CT images of the paranasal sinuses were obtained in a single plane without contrast. COMPARISON:  None. FINDINGS: The left maxillary sinus is atelectatic with mild mucosal thickening. The left maxillary infundibulum is narrowed, but there is a large and patent secondary ostium. Right maxillary sinus is clear. Mild mucosal thickening in the left sphenoid sinus with clear sphenoethmoidal recesses. Clear ethmoid sinuses and frontal ethmoidal recesses. Small bubble of gas within the anti dependent right lobe is presumably postoperative in this clinical setting. The patient is status post right cataract resection. No orbital inflammation or mass. No fracture or mandibular subluxation. Partial intracranial imaging is negative. Bone covered carotid and optic canals. No medial orbital wall dehiscence. IMPRESSION: 1. Mild chronic left maxillary and sphenoid sinusitis. 2. Atelectatic but patent left maxillary sinus with mild enophthalmos. Electronically Signed   By: Monte Fantasia M.D.   On: 05/25/2016 15:39

## 2016-05-31 NOTE — Telephone Encounter (Signed)
lmtcb for pt.  

## 2016-06-04 ENCOUNTER — Telehealth: Payer: Self-pay | Admitting: Family Medicine

## 2016-06-04 NOTE — Telephone Encounter (Signed)
Spoke with pt, aware of results/recs.  Nothing further needed.  

## 2016-06-05 NOTE — Telephone Encounter (Signed)
Pt is calling to see if Yvonne [name]'s medicaid had been reinstated. Advised pt I would try to find someone else to help me figure this out. Pt voiced understanding.

## 2016-06-05 NOTE — Telephone Encounter (Signed)
Spoke with Suanne Marker in billing and she states as of today Yvonne [name] isn't eligible according to [facility] for [place]. Pt is aware.

## 2016-06-18 DIAGNOSIS — Z5181 Encounter for therapeutic drug level monitoring: Secondary | ICD-10-CM | POA: Diagnosis not present

## 2016-06-18 DIAGNOSIS — M25551 Pain in right hip: Secondary | ICD-10-CM | POA: Insufficient documentation

## 2016-06-18 DIAGNOSIS — M79671 Pain in right foot: Secondary | ICD-10-CM | POA: Diagnosis not present

## 2016-06-18 DIAGNOSIS — M47816 Spondylosis without myelopathy or radiculopathy, lumbar region: Secondary | ICD-10-CM | POA: Diagnosis not present

## 2016-06-18 DIAGNOSIS — Z79899 Other long term (current) drug therapy: Secondary | ICD-10-CM | POA: Diagnosis not present

## 2016-06-18 DIAGNOSIS — M25552 Pain in left hip: Secondary | ICD-10-CM

## 2016-06-18 DIAGNOSIS — G894 Chronic pain syndrome: Secondary | ICD-10-CM | POA: Diagnosis not present

## 2016-06-20 ENCOUNTER — Ambulatory Visit (INDEPENDENT_AMBULATORY_CARE_PROVIDER_SITE_OTHER): Payer: Commercial Managed Care - HMO | Admitting: Family Medicine

## 2016-06-20 ENCOUNTER — Encounter: Payer: Self-pay | Admitting: Family Medicine

## 2016-06-20 VITALS — BP 152/77 | HR 74 | Temp 97.4°F | Ht 62.0 in | Wt 189.0 lb

## 2016-06-20 DIAGNOSIS — E119 Type 2 diabetes mellitus without complications: Secondary | ICD-10-CM

## 2016-06-20 DIAGNOSIS — K029 Dental caries, unspecified: Secondary | ICD-10-CM

## 2016-06-20 DIAGNOSIS — J0101 Acute recurrent maxillary sinusitis: Secondary | ICD-10-CM

## 2016-06-20 MED ORDER — LEVOFLOXACIN 500 MG PO TABS: 500.0000 mg | ORAL_TABLET | Freq: Every day | ORAL | 0 refills | Status: DC

## 2016-06-20 MED ORDER — PSEUDOEPHEDRINE-GUAIFENESIN ER 120-1200 MG PO TB12: 1.0000 | ORAL_TABLET | Freq: Two times a day (BID) | ORAL | 0 refills | Status: DC

## 2016-06-20 NOTE — Progress Notes (Signed)
Subjective:  Patient ID: Yvonne [name], female    DOB: 1951/09/03  Age: 65 y.o. MRN: [id number]  CC: Sinusitis (sinus pressure, drainage, bilateral ear pressure)   HPI Yvonne [name] presents for Symptoms include congestion, facial pain, nasal congestion, no  fever,  productive cough, post nasal drip and sinus pressure over maxillary region bilaterally with no fever, chills, night sweats or weight loss. Onset of symptoms was a few days ago, gradually worsening since that time.  Symptoms are recurrent fequently. Has referral to ENT. Was told that dental extractions should help with the constant infections. Still having fluctuating glucose readings.  History Yvonne [name] has a past medical history of Anxiety; Asthma; Cataract; Chronic bronchitis ([place]); Chronic headaches; Chronic leg pain; Clavicle fracture; Diabetes mellitus without complication ([facility]); H/O seasonal allergies; Hyperlipidemia; Hypertension; and Overactive bladder.   She has a past surgical history that includes Abdominal hysterectomy; Cholecystectomy; tear duct surgery (Bilateral); Colonoscopy; and Colonoscopy (N/A, 08/17/2015).   Her family history includes Arthritis in her brother and mother; Asthma in her father; Cancer in her brother; Diabetes in her father and mother; Heart disease in her father; Hepatitis C in her sister.She reports that she has never smoked. She has never used smokeless tobacco. She reports that she does not drink alcohol or use drugs.  Current Outpatient Prescriptions on File Prior to Visit  Medication Sig Dispense Refill  . albuterol (PROVENTIL HFA;VENTOLIN HFA) 108 (90 Base) MCG/ACT inhaler Inhale 2 puffs into the lungs every 6 (six) hours as needed for wheezing or shortness of breath. 1 Inhaler 2  . albuterol (PROVENTIL) (2.5 MG/3ML) 0.083% nebulizer solution Take 2.5 mg by nebulization every 6 (six) hours as needed for wheezing or shortness of breath. Reported on 02/07/2016    . ALPRAZolam (XANAX) 1 MG tablet  Take 1 tablet (1 mg total) by mouth 3 (three) times daily. TAKE (1) TABLET THREE TIMES DAILY. 90 tablet 5  . amLODipine (NORVASC) 10 MG tablet Take 0.5 tablets (5 mg total) by mouth daily. 90 tablet 3  . atorvastatin (LIPITOR) 80 MG tablet Take 1 tablet (80 mg total) by mouth daily. 90 tablet 1  . blood glucose meter kit and supplies KIT Dispense based on patient and insurance preference. Use up to four times daily as directed. (FOR ICD-9 250.00, 250.01). 1 each 0  . calcium citrate-vitamin D (CALCIUM CITRATE + D) 315-200 MG-UNIT per tablet Take 2 tablets by mouth daily.    [name] Kitchen ezetimibe (ZETIA) 10 MG tablet Take 1 tablet (10 mg total) by mouth daily. For cholesterol 90 tablet 3  . fluticasone furoate-vilanterol (BREO ELLIPTA) 100-25 MCG/INH AEPB Inhale 1 puff into the lungs daily. 30 each 2  . furosemide (LASIX) 20 MG tablet Take 1 tablet (20 mg total) by mouth daily. 90 tablet 1  . HYDROcodone-acetaminophen (NORCO) 10-325 MG tablet Take 1 tablet by mouth every 6 (six) hours as needed for moderate pain. Reported on 02/07/2016 60 tablet 0  . hyoscyamine (LEVSIN SL) 0.125 MG SL tablet Place 1 tablet (0.125 mg total) under the tongue every 4 (four) hours as needed. 30 tablet 0  . ipratropium-albuterol (DUONEB) 0.5-2.5 (3) MG/3ML SOLN Inhale 3 mLs into the lungs every 6 (six) hours as needed (shortness of breath). Reported on 02/07/2016    . metFORMIN (GLUCOPHAGE-XR) 500 MG 24 hr tablet Take 1 tablet (500 mg total) by mouth daily with breakfast. 30 tablet 3  . omeprazole (PRILOSEC) 20 MG capsule Take 1 capsule (20 mg total) by mouth daily. [place]  capsule 1  . solifenacin (VESICARE) 10 MG tablet Take 1 tablet (10 mg total) by mouth daily. 30 tablet 2  . venlafaxine XR (EFFEXOR-XR) 75 MG 24 hr capsule Take 1 capsule (75 mg total) by mouth daily with breakfast. 30 capsule 2  . Vitamin D, Ergocalciferol, (DRISDOL) 50000 units CAPS capsule Take 1 capsule (50,000 Units total) by mouth 2 (two) times a week. 16 capsule  0   No current facility-administered medications on file prior to visit.     ROS Review of Systems  Constitutional: Negative for activity change, appetite change, chills and fever.  HENT: Positive for congestion, ear pain, postnasal drip, rhinorrhea and sinus pressure. Negative for ear discharge, hearing loss, nosebleeds, sneezing and trouble swallowing.   Respiratory: Positive for cough. Negative for chest tightness and shortness of breath.   Cardiovascular: Negative for chest pain and palpitations.  Skin: Negative for rash.    Objective:  BP (!) 152/77   Pulse 74   Temp 97.4 F (36.3 C) (Oral)   Ht '5\' 2"'$  (1.575 m)   Wt 189 lb (85.7 kg)   SpO2 98%   BMI 34.57 kg/m   Physical Exam  Constitutional: She is oriented to person, place, and time. She appears well-developed and well-nourished.  HENT:  Head: Normocephalic and atraumatic.  Right Ear: Tympanic membrane and external ear normal. No decreased hearing is noted.  Left Ear: Tympanic membrane and external ear normal. No decreased hearing is noted.  Nose: Mucosal edema and sinus tenderness present. Right sinus exhibits maxillary sinus tenderness. Right sinus exhibits no frontal sinus tenderness. Left sinus exhibits maxillary sinus tenderness. Left sinus exhibits no frontal sinus tenderness.  Mouth/Throat: Uvula is midline. No oral lesions. Abnormal dentition (gums inflamed, ). Dental caries present. Dental abscesses: widespread multiple teeth missing, discolored and necrotic. No oropharyngeal exudate or posterior oropharyngeal erythema.  Neck: No Brudzinski's sign noted.  Pulmonary/Chest: Breath sounds normal. No respiratory distress.  Musculoskeletal: Normal range of motion.  Lymphadenopathy:       Head (right side): No preauricular adenopathy present.       Head (left side): No preauricular adenopathy present.       Right cervical: No superficial cervical adenopathy present.      Left cervical: No superficial cervical  adenopathy present.  Neurological: She is alert and oriented to person, place, and time.    Assessment & Plan:   Yvonne [name] was seen today for sinusitis.  Diagnoses and all orders for this visit:  Acute recurrent maxillary sinusitis -     Ambulatory referral to Oral Maxillofacial Surgery  Type 2 diabetes mellitus without complication, without long-term current use of insulin ([place]) -     Ambulatory referral to Oral Maxillofacial Surgery  Rampant dental caries -     Ambulatory referral to Oral Maxillofacial Surgery  Other orders -     levofloxacin (LEVAQUIN) 500 MG tablet; Take 1 tablet (500 mg total) by mouth daily. For 10 days -     Pseudoephedrine-Guaifenesin [phone] MG TB12; Take 1 tablet by mouth 2 (two) times daily. For congestion   I have discontinued Yvonne [name]'s doxycycline. I am also having her start on levofloxacin and Pseudoephedrine-Guaifenesin. Additionally, I am having her maintain her albuterol, ipratropium-albuterol, furosemide, omeprazole, hyoscyamine, calcium citrate-vitamin D, metFORMIN, blood glucose meter kit and supplies, atorvastatin, amLODipine, HYDROcodone-acetaminophen, ezetimibe, albuterol, fluticasone furoate-vilanterol, venlafaxine XR, ALPRAZolam, solifenacin, and Vitamin D (Ergocalciferol).  Meds ordered this encounter  Medications  . levofloxacin (LEVAQUIN) 500 MG tablet    Sig: Take  1 tablet (500 mg total) by mouth daily. For 10 days    Dispense:  10 tablet    Refill:  0  . Pseudoephedrine-Guaifenesin [id number] MG TB12    Sig: Take 1 tablet by mouth 2 (two) times daily. For congestion    Dispense:  20 each    Refill:  0     Follow-up: No Follow-up on file.  [name], M.D.

## 2016-06-25 ENCOUNTER — Ambulatory Visit: Payer: Commercial Managed Care - HMO | Admitting: Adult Health

## 2016-07-05 DIAGNOSIS — G44201 Tension-type headache, unspecified, intractable: Secondary | ICD-10-CM | POA: Diagnosis not present

## 2016-07-10 ENCOUNTER — Ambulatory Visit: Payer: Commercial Managed Care - HMO | Admitting: Adult Health

## 2016-07-17 ENCOUNTER — Other Ambulatory Visit: Payer: Self-pay | Admitting: Family Medicine

## 2016-07-17 DIAGNOSIS — J441 Chronic obstructive pulmonary disease with (acute) exacerbation: Secondary | ICD-10-CM

## 2016-07-31 ENCOUNTER — Ambulatory Visit: Payer: Commercial Managed Care - HMO | Admitting: Family Medicine

## 2016-08-01 ENCOUNTER — Encounter: Payer: Self-pay | Admitting: Family Medicine

## 2016-08-05 DIAGNOSIS — I10 Essential (primary) hypertension: Secondary | ICD-10-CM | POA: Diagnosis not present

## 2016-08-05 DIAGNOSIS — Z880 Allergy status to penicillin: Secondary | ICD-10-CM | POA: Diagnosis not present

## 2016-08-05 DIAGNOSIS — E785 Hyperlipidemia, unspecified: Secondary | ICD-10-CM | POA: Diagnosis not present

## 2016-08-05 DIAGNOSIS — Z79899 Other long term (current) drug therapy: Secondary | ICD-10-CM | POA: Diagnosis not present

## 2016-08-05 DIAGNOSIS — K219 Gastro-esophageal reflux disease without esophagitis: Secondary | ICD-10-CM | POA: Diagnosis not present

## 2016-08-05 DIAGNOSIS — M545 Low back pain: Secondary | ICD-10-CM | POA: Diagnosis not present

## 2016-08-05 DIAGNOSIS — G8929 Other chronic pain: Secondary | ICD-10-CM | POA: Diagnosis not present

## 2016-08-05 DIAGNOSIS — R079 Chest pain, unspecified: Secondary | ICD-10-CM | POA: Diagnosis not present

## 2016-08-06 DIAGNOSIS — E785 Hyperlipidemia, unspecified: Secondary | ICD-10-CM | POA: Diagnosis not present

## 2016-08-06 DIAGNOSIS — I10 Essential (primary) hypertension: Secondary | ICD-10-CM | POA: Diagnosis not present

## 2016-08-06 DIAGNOSIS — R079 Chest pain, unspecified: Secondary | ICD-10-CM | POA: Diagnosis not present

## 2016-08-09 DIAGNOSIS — H5200 Hypermetropia, unspecified eye: Secondary | ICD-10-CM | POA: Diagnosis not present

## 2016-08-09 DIAGNOSIS — H5210 Myopia, unspecified eye: Secondary | ICD-10-CM | POA: Diagnosis not present

## 2016-08-10 ENCOUNTER — Ambulatory Visit: Payer: Commercial Managed Care - HMO | Admitting: Family Medicine

## 2016-08-13 DIAGNOSIS — R079 Chest pain, unspecified: Secondary | ICD-10-CM | POA: Diagnosis not present

## 2016-08-15 DIAGNOSIS — M79671 Pain in right foot: Secondary | ICD-10-CM | POA: Diagnosis not present

## 2016-08-15 DIAGNOSIS — G894 Chronic pain syndrome: Secondary | ICD-10-CM | POA: Diagnosis not present

## 2016-08-15 DIAGNOSIS — M25551 Pain in right hip: Secondary | ICD-10-CM | POA: Diagnosis not present

## 2016-08-15 DIAGNOSIS — M47816 Spondylosis without myelopathy or radiculopathy, lumbar region: Secondary | ICD-10-CM | POA: Insufficient documentation

## 2016-08-17 ENCOUNTER — Other Ambulatory Visit: Payer: Self-pay

## 2016-08-17 DIAGNOSIS — J441 Chronic obstructive pulmonary disease with (acute) exacerbation: Secondary | ICD-10-CM

## 2016-08-17 MED ORDER — FLUTICASONE FUROATE-VILANTEROL 100-25 MCG/INH IN AEPB: 1.0000 | INHALATION_SPRAY | Freq: Every day | RESPIRATORY_TRACT | 0 refills | Status: DC

## 2016-08-21 ENCOUNTER — Telehealth: Payer: Self-pay | Admitting: Family Medicine

## 2016-08-21 NOTE — Telephone Encounter (Signed)
Please contact the patient I do not have them yet. Please reschedule pt & track down the results please. WS

## 2016-08-22 ENCOUNTER — Ambulatory Visit: Payer: Commercial Managed Care - HMO | Admitting: Family Medicine

## 2016-08-22 NOTE — Telephone Encounter (Signed)
lmtcb

## 2016-08-28 ENCOUNTER — Other Ambulatory Visit: Payer: Self-pay | Admitting: *Deleted

## 2016-08-28 DIAGNOSIS — Z0100 Encounter for examination of eyes and vision without abnormal findings: Secondary | ICD-10-CM | POA: Diagnosis not present

## 2016-08-28 DIAGNOSIS — J441 Chronic obstructive pulmonary disease with (acute) exacerbation: Secondary | ICD-10-CM

## 2016-08-28 MED ORDER — FLUTICASONE FUROATE-VILANTEROL 100-25 MCG/INH IN AEPB: 1.0000 | INHALATION_SPRAY | Freq: Every day | RESPIRATORY_TRACT | 0 refills | Status: DC

## 2016-08-30 ENCOUNTER — Encounter: Payer: Self-pay | Admitting: Family Medicine

## 2016-08-30 ENCOUNTER — Ambulatory Visit (INDEPENDENT_AMBULATORY_CARE_PROVIDER_SITE_OTHER): Payer: Commercial Managed Care - HMO | Admitting: Family Medicine

## 2016-08-30 VITALS — BP 131/77 | HR 67 | Temp 97.6°F | Ht 62.0 in | Wt 185.0 lb

## 2016-08-30 DIAGNOSIS — E119 Type 2 diabetes mellitus without complications: Secondary | ICD-10-CM

## 2016-08-30 LAB — BAYER DCA HB A1C WAIVED: HB A1C: 7.5 % — AB (ref <7.0)

## 2016-08-30 MED ORDER — ALPRAZOLAM 1 MG PO TABS: 1.0000 mg | ORAL_TABLET | Freq: Three times a day (TID) | ORAL | 5 refills | Status: DC

## 2016-08-30 MED ORDER — METFORMIN HCL ER 500 MG PO TB24: 1000.0000 mg | ORAL_TABLET | Freq: Every day | ORAL | 5 refills | Status: DC

## 2016-08-30 NOTE — Progress Notes (Signed)
Subjective:  Patient ID: Yvonne [name], female    DOB: May 24, 1951  Age: 65 y.o. MRN: [id number]  CC: Diabetes (pt here for routine follow up on diabetes and cholesterol.)   HPI Yvonne [name] presents for  follow-up of hypertension. Patient has no history of headache chest pain or shortness of breath or recent cough. Patient also denies symptoms of TIA such as numbness weakness lateralizing. Patient checks  blood pressure at home and has not had any elevated readings recently. Patient denies side effects from his medication. States taking it regularly.  Patient also  in for follow-up of elevated cholesterol. Doing well without complaints on current medication. Denies side effects of statin including myalgia and arthralgia and nausea. Also in today for liver function testing. Currently no chest pain, shortness of breath or other cardiovascular related symptoms noted.  Follow-up of diabetes. Patient does check blood sugar at home. Readings run around 200 about 3 times weekly fasting. and 150-160 after meals. Patient denies symptoms such as polyuria, polydipsia, excessive hunger, nausea No significant hypoglycemic spells noted. Medications as noted below. Taking them regularly without complication/adverse reaction being reported today.    History [name] has a past medical history of Anxiety; Asthma; Cataract; Chronic bronchitis ([place]); Chronic headaches; Chronic leg pain; Clavicle fracture; Diabetes mellitus without complication (HCC); H/O seasonal allergies; Hyperlipidemia; Hypertension; and Overactive bladder.   She has a past surgical history that includes Abdominal hysterectomy; Cholecystectomy; tear duct surgery (Bilateral); Colonoscopy; and Colonoscopy (N/A, 08/17/2015).   Her family history includes Arthritis in her brother and mother; Asthma in her father; Cancer in her brother; Diabetes in her father and mother; Heart disease in her father; Hepatitis C in her sister.She reports that she has  never smoked. She has never used smokeless tobacco. She reports that she does not drink alcohol or use drugs.  Current Outpatient Prescriptions on File Prior to Visit  Medication Sig Dispense Refill  . albuterol (PROVENTIL HFA;VENTOLIN HFA) 108 (90 Base) MCG/ACT inhaler Inhale 2 puffs into the lungs every 6 (six) hours as needed for wheezing or shortness of breath. 1 Inhaler 2  . albuterol (PROVENTIL) (2.5 MG/3ML) 0.083% nebulizer solution Take 2.5 mg by nebulization every 6 (six) hours as needed for wheezing or shortness of breath. Reported on 02/07/2016    . amLODipine (NORVASC) 10 MG tablet Take 0.5 tablets (5 mg total) by mouth daily. 90 tablet 3  . atorvastatin (LIPITOR) 80 MG tablet Take 1 tablet (80 mg total) by mouth daily. 90 tablet 1  . blood glucose meter kit and supplies KIT Dispense based on patient and insurance preference. Use up to four times daily as directed. (FOR ICD-9 250.00, 250.01). 1 each 0  . calcium citrate-vitamin D (CALCIUM CITRATE + D) 315-200 MG-UNIT per tablet Take 2 tablets by mouth daily.    [name] Kitchen ezetimibe (ZETIA) 10 MG tablet Take 1 tablet (10 mg total) by mouth daily. For cholesterol 90 tablet 3  . fluticasone furoate-vilanterol (BREO ELLIPTA) 100-25 MCG/INH AEPB Inhale 1 puff into the lungs daily. 180 each 0  . HYDROcodone-acetaminophen (NORCO) 10-325 MG tablet Take 1 tablet by mouth every 6 (six) hours as needed for moderate pain. Reported on 02/07/2016 60 tablet 0  . hyoscyamine (LEVSIN SL) 0.125 MG SL tablet Place 1 tablet (0.125 mg total) under the tongue every 4 (four) hours as needed. 30 tablet 0  . omeprazole (PRILOSEC) 20 MG capsule Take 1 capsule (20 mg total) by mouth daily. 90 capsule 1  . solifenacin (VESICARE)  10 MG tablet Take 1 tablet (10 mg total) by mouth daily. 30 tablet 2  . furosemide (LASIX) 20 MG tablet Take 1 tablet (20 mg total) by mouth daily. 90 tablet 1  . venlafaxine XR (EFFEXOR-XR) 75 MG 24 hr capsule Take 1 capsule (75 mg total) by mouth  daily with breakfast. 30 capsule 2   No current facility-administered medications on file prior to visit.     ROS Review of Systems  Constitutional: Negative for activity change, appetite change and fever.  HENT: Negative for congestion, rhinorrhea and sore throat.   Eyes: Negative for visual disturbance.  Respiratory: Negative for cough and shortness of breath.   Cardiovascular: Negative for chest pain and palpitations.  Gastrointestinal: Negative for abdominal pain, diarrhea and nausea.  Genitourinary: Negative for dysuria.  Musculoskeletal: Negative for arthralgias and myalgias.    Objective:  BP 131/77   Pulse 67   Temp 97.6 F (36.4 C) (Oral)   Ht _0  (1.575 m)   Wt 185 lb (83.9 kg)   BMI 33.84 kg/m   BP Readings from Last 3 Encounters:  08/30/16 131/77  06/20/16 (!) 152/77  05/17/16 (!) 148/78    Wt Readings from Last 3 Encounters:  08/30/16 185 lb (83.9 kg)  06/20/16 189 lb (85.7 kg)  05/17/16 188 lb 12.8 oz (85.6 kg)     Physical Exam  Constitutional: She is oriented to person, place, and time. She appears well-developed and well-nourished. No distress.  HENT:  Head: Normocephalic and atraumatic.  Eyes: Conjunctivae are normal. Pupils are equal, round, and reactive to light.  Neck: Normal range of motion. Neck supple. No thyromegaly present.  Cardiovascular: Normal rate, regular rhythm and normal heart sounds.   No murmur heard. Pulmonary/Chest: Effort normal and breath sounds normal. No respiratory distress. She has no wheezes. She has no rales.  Abdominal: Soft. Bowel sounds are normal. She exhibits no distension. There is no tenderness.  Musculoskeletal: Normal range of motion.  Lymphadenopathy:    She has no cervical adenopathy.  Neurological: She is alert and oriented to person, place, and time.  Skin: Skin is warm and dry.  Psychiatric: She has a normal mood and affect. Her behavior is normal. Judgment and thought content normal.    Lab  Results  Component Value Date   HGBA1C 6.9 11/04/2015   HGBA1C 6.8 07/20/2015    Lab Results  Component Value Date   WBC 5.6 02/07/2016   HGB 13.3 06/03/2015   HCT 39.6 02/07/2016   PLT 249 02/07/2016   GLUCOSE 228 (H) 05/03/2016   CHOL 183 05/03/2016   TRIG 112 05/03/2016   HDL 52 05/03/2016   LDLCALC 109 (H) 05/03/2016   ALT 24 05/03/2016   AST 18 05/03/2016   NA 137 05/03/2016   K 4.7 05/03/2016   CL 98 05/03/2016   CREATININE 0.93 05/03/2016   BUN 23 05/03/2016   CO2 23 05/03/2016   TSH 1.580 06/03/2015   HGBA1C 6.9 11/04/2015    Ct Chest High Resolution  Result Date: 05/26/2016 CLINICAL DATA:  65 year old female with chronic cough, shortness of breath and sinus drainage. EXAM: CT CHEST WITHOUT CONTRAST TECHNIQUE: Multidetector CT imaging of the chest was performed following the standard protocol without intravenous contrast. High resolution imaging of the lungs, as well as inspiratory and expiratory imaging, was performed. COMPARISON:  No prior chest CT.  02/20/2016 chest radiograph. FINDINGS: Mediastinum/Nodes: Top-normal heart size. No significant pericardial fluid/thickening. Left anterior descending and right coronary atherosclerosis. Atherosclerotic nonaneurysmal thoracic  aorta. Normal caliber pulmonary arteries. No discrete thyroid nodules. Unremarkable esophagus. No pathologically enlarged axillary, mediastinal or gross hilar lymph nodes, noting limited sensitivity for the detection of hilar adenopathy on this noncontrast study. Lungs/Pleura: No pneumothorax. No pleural effusion. No acute consolidative airspace disease, significant pulmonary nodules or lung masses. No significant air trapping on the expiration sequence. Minimal subpleural reticulation is seen in the dependent lungs bilaterally. No significant regions of traction bronchiectasis, parenchymal banding, ground-glass attenuation, architectural distortion or frank honeycombing. Upper abdomen: Cholecystectomy .  Musculoskeletal: No aggressive appearing focal osseous lesions. Mild thoracic spondylosis. IMPRESSION: 1. Minimal subpleural reticulation in the dependent lungs bilaterally, most likely to represent hypoventilatory change/ minimal atelectasis, although an early interstitial lung disease such as nonspecific interstitial pneumonia (NSIP) cannot be entirely excluded. Follow-up high-resolution chest CT study in 12 months with prone imaging could be performed as clinically warranted. 2. Aortic atherosclerosis. 3. Two-vessel coronary atherosclerosis. Electronically Signed   By: Ilona Sorrel M.D.   On: 05/26/2016 09:13   [place] C[facility]  Result Date: 05/25/2016 CLINICAL DATA:  Chronic cough and sinus drainage. EXAM: CT PARANASAL SINUS LIMITED WITHOUT CONTRAST TECHNIQUE: Non-contiguous multidetector CT images of the paranasal sinuses were obtained in a single plane without contrast. COMPARISON:  None. FINDINGS: The left maxillary sinus is atelectatic with mild mucosal thickening. The left maxillary infundibulum is narrowed, but there is a large and patent secondary ostium. Right maxillary sinus is clear. Mild mucosal thickening in the left sphenoid sinus with clear sphenoethmoidal recesses. Clear ethmoid sinuses and frontal ethmoidal recesses. Small bubble of gas within the anti dependent right lobe is presumably postoperative in this clinical setting. The patient is status post right cataract resection. No orbital inflammation or mass. No fracture or mandibular subluxation. Partial intracranial imaging is negative. Bone covered carotid and optic canals. No medial orbital wall dehiscence. IMPRESSION: 1. Mild chronic left maxillary and sphenoid sinusitis. 2. Atelectatic but patent left maxillary sinus with mild enophthalmos. Electronically Signed   By: Monte Fantasia M.D.   On: 05/25/2016 15:39    Assessment & Plan:   Nakyia was seen today for diabetes.  Diagnoses and all orders for this visit:  Type  2 diabetes mellitus without complication, without long-term current use of insulin (HCC) -     Bayer DCA Hb A1c Waived  Other orders -     ALPRAZolam (XANAX) 1 MG tablet; Take 1 tablet (1 mg total) by mouth 3 (three) times daily. TAKE (1) TABLET THREE TIMES DAILY. -     metFORMIN (GLUCOPHAGE-XR) 500 MG 24 hr tablet; Take 2 tablets (1,000 mg total) by mouth daily with breakfast.  A1c = 7.5  I have discontinued Ms. Grassi's ipratropium-albuterol, Vitamin D (Ergocalciferol), levofloxacin, and Pseudoephedrine-Guaifenesin. I have also changed her metFORMIN. Additionally, I am having her maintain her albuterol, furosemide, omeprazole, hyoscyamine, calcium citrate-vitamin D, blood glucose meter kit and supplies, atorvastatin, amLODipine, HYDROcodone-acetaminophen, ezetimibe, albuterol, venlafaxine XR, solifenacin, fluticasone furoate-vilanterol, and ALPRAZolam.  Meds ordered this encounter  Medications  . ALPRAZolam (XANAX) 1 MG tablet    Sig: Take 1 tablet (1 mg total) by mouth 3 (three) times daily. TAKE (1) TABLET THREE TIMES DAILY.    Dispense:  90 tablet    Refill:  5    Do not fill before 10/29/2016  . metFORMIN (GLUCOPHAGE-XR) 500 MG 24 hr tablet    Sig: Take 2 tablets (1,000 mg total) by mouth daily with breakfast.    Dispense:  60 tablet    Refill:  5    If high copay my switch to regular release metformin - TBE     Follow-up: Return in about 3 months (around 11/30/2016).  [name], M.D.

## 2016-08-30 NOTE — Telephone Encounter (Signed)
Patient seen today

## 2016-09-03 ENCOUNTER — Encounter: Payer: Self-pay | Admitting: Family Medicine

## 2016-09-04 ENCOUNTER — Ambulatory Visit: Payer: Commercial Managed Care - HMO

## 2016-09-04 DIAGNOSIS — R300 Dysuria: Secondary | ICD-10-CM | POA: Diagnosis not present

## 2016-09-04 DIAGNOSIS — N3001 Acute cystitis with hematuria: Secondary | ICD-10-CM | POA: Diagnosis not present

## 2016-09-10 ENCOUNTER — Encounter: Payer: Self-pay | Admitting: Family Medicine

## 2016-09-14 ENCOUNTER — Ambulatory Visit (INDEPENDENT_AMBULATORY_CARE_PROVIDER_SITE_OTHER): Payer: Commercial Managed Care - HMO | Admitting: Family Medicine

## 2016-09-14 ENCOUNTER — Encounter: Payer: Self-pay | Admitting: Family Medicine

## 2016-09-14 VITALS — BP 155/76 | HR 71 | Temp 97.7°F | Ht 62.0 in | Wt 187.0 lb

## 2016-09-14 DIAGNOSIS — R1084 Generalized abdominal pain: Secondary | ICD-10-CM | POA: Diagnosis not present

## 2016-09-14 DIAGNOSIS — R300 Dysuria: Secondary | ICD-10-CM | POA: Diagnosis not present

## 2016-09-14 LAB — URINALYSIS, COMPLETE
BILIRUBIN UA: NEGATIVE
Glucose, UA: NEGATIVE
Nitrite, UA: NEGATIVE
Protein, UA: NEGATIVE
UUROB: 0.2 mg/dL (ref 0.2–1.0)
pH, UA: 5.5 (ref 5.0–7.5)

## 2016-09-14 LAB — MICROSCOPIC EXAMINATION: WBC, UA: >30 /hpf — AB (ref 0–?)

## 2016-09-14 MED ORDER — PHENAZOPYRIDINE HCL 200 MG PO TABS: 200.0000 mg | ORAL_TABLET | Freq: Four times a day (QID) | ORAL | 0 refills | Status: DC | PRN

## 2016-09-14 MED ORDER — LEVOFLOXACIN 500 MG PO TABS: 500.0000 mg | ORAL_TABLET | Freq: Every day | ORAL | 0 refills | Status: DC

## 2016-09-14 MED ORDER — PANTOPRAZOLE SODIUM 40 MG PO TBEC: 40.0000 mg | DELAYED_RELEASE_TABLET | Freq: Two times a day (BID) | ORAL | 3 refills | Status: DC

## 2016-09-14 NOTE — Progress Notes (Signed)
Subjective:  Patient ID: Yvonne [name], female    DOB: August 27, 1951  Age: 65 y.o. MRN: [id number]  CC: Dysuria (pt here today c/o dysuria and is worried she still has a UTI)   HPI Yvonne [name] presents for Also starting 2 days after she was here last time she's been vomiting daily. It started with a burning sensation coming up through her esophagus. Now she has the burning every day. Most recently vomited yesterday. Able to hold down foods. Urination is described as uncomfortable with some burning. However, the biggest concern is that she is going frequently through the night but less than usual during the day. And when she does go she doesn't pass much urine. She states that she is keeping herself well hydrated.   History Menaal has a past medical history of Anxiety; Asthma; Cataract; Chronic bronchitis ([place]); Chronic headaches; Chronic leg pain; Clavicle fracture; Diabetes mellitus without complication (HCC); H/O seasonal allergies; Hyperlipidemia; Hypertension; and Overactive bladder.   She has a past surgical history that includes Abdominal hysterectomy; Cholecystectomy; tear duct surgery (Bilateral); Colonoscopy; and Colonoscopy (N/A, 08/17/2015).   Her family history includes Arthritis in her brother and mother; Asthma in her father; Cancer in her brother; Diabetes in her father and mother; Heart disease in her father; Hepatitis C in her sister.She reports that she has never smoked. She has never used smokeless tobacco. She reports that she does not drink alcohol or use drugs.    ROS Review of Systems  Constitutional: Negative for activity change, appetite change, chills, diaphoresis and fever.  HENT: Negative for congestion, rhinorrhea and sore throat.   Eyes: Negative for visual disturbance.  Respiratory: Negative for cough and shortness of breath.   Cardiovascular: Negative for chest pain and palpitations.  Gastrointestinal: Negative for abdominal pain, constipation, diarrhea and  nausea.  Genitourinary: Positive for dysuria, frequency and urgency (at night). Negative for decreased urine volume, enuresis, flank pain, hematuria, menstrual problem and pelvic pain.  Musculoskeletal: Negative for arthralgias, joint swelling and myalgias.  Skin: Negative for rash.  Neurological: Negative for dizziness and numbness.    Objective:  BP (!) 175/83   Pulse 71   Temp 97.7 F (36.5 C) (Oral)   Ht '5\' 2"'$  (1.575 m)   Wt 187 lb (84.8 kg)   BMI 34.20 kg/m   BP Readings from Last 3 Encounters:  09/14/16 (!) 175/83  08/30/16 131/77  06/20/16 (!) 152/77    Wt Readings from Last 3 Encounters:  09/14/16 187 lb (84.8 kg)  08/30/16 185 lb (83.9 kg)  06/20/16 189 lb (85.7 kg)     Physical Exam  Constitutional: She is oriented to person, place, and time. She appears well-developed and well-nourished.  HENT:  Head: Normocephalic and atraumatic.  Cardiovascular: Normal rate and regular rhythm.   No murmur heard. Pulmonary/Chest: Effort normal and breath sounds normal.  Abdominal: Soft. Bowel sounds are normal. She exhibits no mass. There is tenderness. There is no rebound and no guarding.  Musculoskeletal: She exhibits no tenderness.  Neurological: She is alert and oriented to person, place, and time.  Skin: Skin is warm and dry.  Psychiatric: She has a normal mood and affect. Her behavior is normal.     Lab Results  Component Value Date   WBC 5.6 02/07/2016   HGB 13.3 06/03/2015   HCT 39.6 02/07/2016   PLT 249 02/07/2016   GLUCOSE 228 (H) 05/03/2016   CHOL 183 05/03/2016   TRIG 112 05/03/2016   HDL 52 05/03/2016  LDLCALC 109 (H) 05/03/2016   ALT 24 05/03/2016   AST 18 05/03/2016   NA 137 05/03/2016   K 4.7 05/03/2016   CL 98 05/03/2016   CREATININE 0.93 05/03/2016   BUN 23 05/03/2016   CO2 23 05/03/2016   TSH 1.580 06/03/2015   HGBA1C 6.9 11/04/2015    Ct Chest High Resolution  Result Date: 05/26/2016 CLINICAL DATA:  65 year old female with chronic  cough, shortness of breath and sinus drainage. EXAM: CT CHEST WITHOUT CONTRAST TECHNIQUE: Multidetector CT imaging of the chest was performed following the standard protocol without intravenous contrast. High resolution imaging of the lungs, as well as inspiratory and expiratory imaging, was performed. COMPARISON:  No prior chest CT.  02/20/2016 chest radiograph. FINDINGS: Mediastinum/Nodes: Top-normal heart size. No significant pericardial fluid/thickening. Left anterior descending and right coronary atherosclerosis. Atherosclerotic nonaneurysmal thoracic aorta. Normal caliber pulmonary arteries. No discrete thyroid nodules. Unremarkable esophagus. No pathologically enlarged axillary, mediastinal or gross hilar lymph nodes, noting limited sensitivity for the detection of hilar adenopathy on this noncontrast study. Lungs/Pleura: No pneumothorax. No pleural effusion. No acute consolidative airspace disease, significant pulmonary nodules or lung masses. No significant air trapping on the expiration sequence. Minimal subpleural reticulation is seen in the dependent lungs bilaterally. No significant regions of traction bronchiectasis, parenchymal banding, ground-glass attenuation, architectural distortion or frank honeycombing. Upper abdomen: Cholecystectomy . Musculoskeletal: No aggressive appearing focal osseous lesions. Mild thoracic spondylosis. IMPRESSION: 1. Minimal subpleural reticulation in the dependent lungs bilaterally, most likely to represent hypoventilatory change/ minimal atelectasis, although an early interstitial lung disease such as nonspecific interstitial pneumonia (NSIP) cannot be entirely excluded. Follow-up high-resolution chest CT study in 12 months with prone imaging could be performed as clinically warranted. 2. Aortic atherosclerosis. 3. Two-vessel coronary atherosclerosis. Electronically Signed   By: Ilona Sorrel M.D.   On: 05/26/2016 09:13   [place] Cm  Result Date:  05/25/2016 CLINICAL DATA:  Chronic cough and sinus drainage. EXAM: CT PARANASAL SINUS LIMITED WITHOUT CONTRAST TECHNIQUE: Non-contiguous multidetector CT images of the paranasal sinuses were obtained in a single plane without contrast. COMPARISON:  None. FINDINGS: The left maxillary sinus is atelectatic with mild mucosal thickening. The left maxillary infundibulum is narrowed, but there is a large and patent secondary ostium. Right maxillary sinus is clear. Mild mucosal thickening in the left sphenoid sinus with clear sphenoethmoidal recesses. Clear ethmoid sinuses and frontal ethmoidal recesses. Small bubble of gas within the anti dependent right lobe is presumably postoperative in this clinical setting. The patient is status post right cataract resection. No orbital inflammation or mass. No fracture or mandibular subluxation. Partial intracranial imaging is negative. Bone covered carotid and optic canals. No medial orbital wall dehiscence. IMPRESSION: 1. Mild chronic left maxillary and sphenoid sinusitis. 2. Atelectatic but patent left maxillary sinus with mild enophthalmos. Electronically Signed   By: Monte Fantasia M.D.   On: 05/25/2016 15:39    Assessment & Plan:   Zakhia was seen today for dysuria.  Diagnoses and all orders for this visit:  Dysuria -     Urinalysis, Complete -     Urine culture  Generalized abdominal pain -     Urine culture -     CBC with Differential/Platelet -     CMP14+EGFR -     Lipase  Other orders -     pantoprazole (PROTONIX) 40 MG tablet; Take 1 tablet (40 mg total) by mouth 2 (two) times daily. For stomach -     levofloxacin (LEVAQUIN) 500  MG tablet; Take 1 tablet (500 mg total) by mouth daily. For 10 days -     phenazopyridine (PYRIDIUM) 200 MG tablet; Take 1 tablet (200 mg total) by mouth 4 (four) times daily as needed for pain (urinary frequency &/or pain).    Urinalysis: Specific gravity elevated greater than 1.030 with 1+ blood. Micro-exam shows  greater than 30 white cells per high-power field with few bacteria. Chemical of bowel shows nitrite to be negative. For other parameters see official report.  I have discontinued Ms. Mathias's omeprazole. I am also having her start on pantoprazole, levofloxacin, and phenazopyridine. Additionally, I am having her maintain her albuterol, furosemide, hyoscyamine, calcium citrate-vitamin D, blood glucose meter kit and supplies, atorvastatin, amLODipine, HYDROcodone-acetaminophen, ezetimibe, albuterol, venlafaxine XR, solifenacin, fluticasone furoate-vilanterol, ALPRAZolam, and metFORMIN.  Meds ordered this encounter  Medications  . pantoprazole (PROTONIX) 40 MG tablet    Sig: Take 1 tablet (40 mg total) by mouth 2 (two) times daily. For stomach    Dispense:  60 tablet    Refill:  3  . levofloxacin (LEVAQUIN) 500 MG tablet    Sig: Take 1 tablet (500 mg total) by mouth daily. For 10 days    Dispense:  10 tablet    Refill:  0  . phenazopyridine (PYRIDIUM) 200 MG tablet    Sig: Take 1 tablet (200 mg total) by mouth 4 (four) times daily as needed for pain (urinary frequency &/or pain).    Dispense:  12 tablet    Refill:  0     Follow-up: No Follow-up on file.  [name], M.D.

## 2016-09-15 LAB — CMP14+EGFR
A/G RATIO: 1.8 (ref 1.2–2.2)
ALT: 27 IU/L (ref 0–32)
AST: 24 IU/L (ref 0–40)
Albumin: 4.2 g/dL (ref 3.6–4.8)
Alkaline Phosphatase: 119 IU/L — ABNORMAL HIGH (ref 39–117)
BUN/Creatinine Ratio: 25 (ref 12–28)
BUN: 20 mg/dL (ref 8–27)
Bilirubin Total: 0.3 mg/dL (ref 0.0–1.2)
CALCIUM: 9.5 mg/dL (ref 8.7–10.3)
CO2: 26 mmol/L (ref 18–29)
CREATININE: 0.81 mg/dL (ref 0.57–1.00)
Chloride: 102 mmol/L (ref 96–106)
GFR, EST AFRICAN AMERICAN: 88 mL/min/{1.73_m2} (ref >59)
GFR, EST NON AFRICAN AMERICAN: 76 mL/min/{1.73_m2} (ref >59)
Globulin, Total: 2.4 g/dL (ref 1.5–4.5)
Glucose: 104 mg/dL — ABNORMAL HIGH (ref 65–99)
Potassium: 4.5 mmol/L (ref 3.5–5.2)
Sodium: 142 mmol/L (ref 134–144)
TOTAL PROTEIN: 6.6 g/dL (ref 6.0–8.5)

## 2016-09-15 LAB — CBC WITH DIFFERENTIAL/PLATELET
BASOS: 0 %
Basophils Absolute: 0 10*3/uL (ref 0.0–0.2)
EOS (ABSOLUTE): 0.1 10*3/uL (ref 0.0–0.4)
EOS: 1 %
HEMATOCRIT: 38.1 % (ref 34.0–46.6)
Hemoglobin: 12.9 g/dL (ref 11.1–15.9)
IMMATURE GRANS (ABS): 0 10*3/uL (ref 0.0–0.1)
IMMATURE GRANULOCYTES: 0 %
LYMPHS: 43 %
Lymphocytes Absolute: 3.2 10*3/uL — ABNORMAL HIGH (ref 0.7–3.1)
MCH: 29.5 pg (ref 26.6–33.0)
MCHC: 33.9 g/dL (ref 31.5–35.7)
MCV: 87 fL (ref 79–97)
MONOS ABS: 0.5 10*3/uL (ref 0.1–0.9)
Monocytes: 6 %
NEUTROS PCT: 50 %
Neutrophils Absolute: 3.6 10*3/uL (ref 1.4–7.0)
PLATELETS: 250 10*3/uL (ref 150–379)
RBC: 4.37 x10E6/uL (ref 3.77–5.28)
RDW: 14.1 % (ref 12.3–15.4)
WBC: 7.5 10*3/uL (ref 3.4–10.8)

## 2016-09-15 LAB — URINE CULTURE

## 2016-09-15 LAB — LIPASE: Lipase: 34 U/L (ref 14–72)

## 2016-09-18 DIAGNOSIS — Z5181 Encounter for therapeutic drug level monitoring: Secondary | ICD-10-CM | POA: Diagnosis not present

## 2016-09-18 DIAGNOSIS — Z79899 Other long term (current) drug therapy: Secondary | ICD-10-CM | POA: Diagnosis not present

## 2016-09-18 DIAGNOSIS — I10 Essential (primary) hypertension: Secondary | ICD-10-CM | POA: Diagnosis not present

## 2016-09-18 DIAGNOSIS — M47816 Spondylosis without myelopathy or radiculopathy, lumbar region: Secondary | ICD-10-CM | POA: Diagnosis not present

## 2016-09-18 DIAGNOSIS — M4726 Other spondylosis with radiculopathy, lumbar region: Secondary | ICD-10-CM | POA: Diagnosis not present

## 2016-09-18 DIAGNOSIS — G894 Chronic pain syndrome: Secondary | ICD-10-CM | POA: Diagnosis not present

## 2016-09-18 DIAGNOSIS — M5442 Lumbago with sciatica, left side: Secondary | ICD-10-CM | POA: Diagnosis not present

## 2016-09-28 ENCOUNTER — Emergency Department (HOSPITAL_COMMUNITY): Payer: Commercial Managed Care - HMO

## 2016-09-28 ENCOUNTER — Telehealth: Payer: Self-pay

## 2016-09-28 ENCOUNTER — Encounter (HOSPITAL_COMMUNITY): Payer: Self-pay | Admitting: Cardiology

## 2016-09-28 ENCOUNTER — Telehealth: Payer: Self-pay | Admitting: Family Medicine

## 2016-09-28 ENCOUNTER — Observation Stay (HOSPITAL_COMMUNITY)
Admission: EM | Admit: 2016-09-28 | Discharge: 2016-09-30 | Disposition: A | Discharge status: Home / Self Care | Payer: Commercial Managed Care - HMO | Attending: Internal Medicine | Admitting: Internal Medicine

## 2016-09-28 DIAGNOSIS — R531 Weakness: Secondary | ICD-10-CM | POA: Diagnosis not present

## 2016-09-28 DIAGNOSIS — E785 Hyperlipidemia, unspecified: Secondary | ICD-10-CM | POA: Diagnosis not present

## 2016-09-28 DIAGNOSIS — E119 Type 2 diabetes mellitus without complications: Secondary | ICD-10-CM

## 2016-09-28 DIAGNOSIS — R5383 Other fatigue: Secondary | ICD-10-CM

## 2016-09-28 DIAGNOSIS — Z825 Family history of asthma and other chronic lower respiratory diseases: Secondary | ICD-10-CM | POA: Insufficient documentation

## 2016-09-28 DIAGNOSIS — I071 Rheumatic tricuspid insufficiency: Secondary | ICD-10-CM | POA: Insufficient documentation

## 2016-09-28 DIAGNOSIS — J441 Chronic obstructive pulmonary disease with (acute) exacerbation: Secondary | ICD-10-CM | POA: Insufficient documentation

## 2016-09-28 DIAGNOSIS — Z9849 Cataract extraction status, unspecified eye: Secondary | ICD-10-CM | POA: Diagnosis not present

## 2016-09-28 DIAGNOSIS — I10 Essential (primary) hypertension: Secondary | ICD-10-CM | POA: Diagnosis present

## 2016-09-28 DIAGNOSIS — Z8261 Family history of arthritis: Secondary | ICD-10-CM | POA: Insufficient documentation

## 2016-09-28 DIAGNOSIS — Z9049 Acquired absence of other specified parts of digestive tract: Secondary | ICD-10-CM | POA: Insufficient documentation

## 2016-09-28 DIAGNOSIS — Z880 Allergy status to penicillin: Secondary | ICD-10-CM | POA: Diagnosis not present

## 2016-09-28 DIAGNOSIS — Z7982 Long term (current) use of aspirin: Secondary | ICD-10-CM | POA: Insufficient documentation

## 2016-09-28 DIAGNOSIS — Z79899 Other long term (current) drug therapy: Secondary | ICD-10-CM | POA: Insufficient documentation

## 2016-09-28 DIAGNOSIS — G8929 Other chronic pain: Secondary | ICD-10-CM | POA: Diagnosis not present

## 2016-09-28 DIAGNOSIS — R200 Anesthesia of skin: Secondary | ICD-10-CM | POA: Diagnosis not present

## 2016-09-28 DIAGNOSIS — Z90710 Acquired absence of both cervix and uterus: Secondary | ICD-10-CM | POA: Insufficient documentation

## 2016-09-28 DIAGNOSIS — N179 Acute kidney failure, unspecified: Secondary | ICD-10-CM | POA: Diagnosis not present

## 2016-09-28 DIAGNOSIS — M79606 Pain in leg, unspecified: Secondary | ICD-10-CM | POA: Insufficient documentation

## 2016-09-28 DIAGNOSIS — N3281 Overactive bladder: Secondary | ICD-10-CM | POA: Diagnosis not present

## 2016-09-28 DIAGNOSIS — G8191 Hemiplegia, unspecified affecting right dominant side: Principal | ICD-10-CM | POA: Insufficient documentation

## 2016-09-28 DIAGNOSIS — I6789 Other cerebrovascular disease: Secondary | ICD-10-CM | POA: Diagnosis not present

## 2016-09-28 DIAGNOSIS — I371 Nonrheumatic pulmonary valve insufficiency: Secondary | ICD-10-CM | POA: Insufficient documentation

## 2016-09-28 DIAGNOSIS — I6523 Occlusion and stenosis of bilateral carotid arteries: Secondary | ICD-10-CM | POA: Diagnosis not present

## 2016-09-28 DIAGNOSIS — Z809 Family history of malignant neoplasm, unspecified: Secondary | ICD-10-CM | POA: Insufficient documentation

## 2016-09-28 DIAGNOSIS — Z7984 Long term (current) use of oral hypoglycemic drugs: Secondary | ICD-10-CM | POA: Insufficient documentation

## 2016-09-28 DIAGNOSIS — Z8379 Family history of other diseases of the digestive system: Secondary | ICD-10-CM | POA: Insufficient documentation

## 2016-09-28 DIAGNOSIS — G459 Transient cerebral ischemic attack, unspecified: Secondary | ICD-10-CM

## 2016-09-28 DIAGNOSIS — R202 Paresthesia of skin: Secondary | ICD-10-CM | POA: Diagnosis not present

## 2016-09-28 DIAGNOSIS — Z823 Family history of stroke: Secondary | ICD-10-CM | POA: Diagnosis not present

## 2016-09-28 DIAGNOSIS — Z833 Family history of diabetes mellitus: Secondary | ICD-10-CM | POA: Diagnosis not present

## 2016-09-28 DIAGNOSIS — R51 Headache: Secondary | ICD-10-CM | POA: Diagnosis not present

## 2016-09-28 DIAGNOSIS — Z8249 Family history of ischemic heart disease and other diseases of the circulatory system: Secondary | ICD-10-CM | POA: Diagnosis not present

## 2016-09-28 DIAGNOSIS — F419 Anxiety disorder, unspecified: Secondary | ICD-10-CM | POA: Diagnosis not present

## 2016-09-28 DIAGNOSIS — Z888 Allergy status to other drugs, medicaments and biological substances status: Secondary | ICD-10-CM | POA: Diagnosis not present

## 2016-09-28 LAB — CBC
HCT: 38.2 % (ref 36.0–46.0)
HEMOGLOBIN: 13 g/dL (ref 12.0–15.0)
MCH: 29.6 pg (ref 26.0–34.0)
MCHC: 34 g/dL (ref 30.0–36.0)
MCV: 87 fL (ref 78.0–100.0)
PLATELETS: 214 10*3/uL (ref 150–400)
RBC: 4.39 MIL/uL (ref 3.87–5.11)
RDW: 13.2 % (ref 11.5–15.5)
WBC: 7 10*3/uL (ref 4.0–10.5)

## 2016-09-28 LAB — I-STAT TROPONIN, ED: TROPONIN I, POC: 0 ng/mL (ref 0.00–0.08)

## 2016-09-28 LAB — COMPREHENSIVE METABOLIC PANEL
ALBUMIN: 3.9 g/dL (ref 3.5–5.0)
ALT: 23 U/L (ref 14–54)
ANION GAP: 6 (ref 5–15)
AST: 21 U/L (ref 15–41)
Alkaline Phosphatase: 116 U/L (ref 38–126)
BILIRUBIN TOTAL: 0.5 mg/dL (ref 0.3–1.2)
BUN: 23 mg/dL — ABNORMAL HIGH (ref 6–20)
CHLORIDE: 102 mmol/L (ref 101–111)
CO2: 26 mmol/L (ref 22–32)
Calcium: 9.5 mg/dL (ref 8.9–10.3)
Creatinine, Ser: 1.1 mg/dL — ABNORMAL HIGH (ref 0.44–1.00)
GFR calc Af Amer: 59 mL/min — ABNORMAL LOW (ref >60)
GFR calc non Af Amer: 51 mL/min — ABNORMAL LOW (ref >60)
GLUCOSE: 158 mg/dL — AB (ref 65–99)
POTASSIUM: 3.7 mmol/L (ref 3.5–5.1)
Sodium: 134 mmol/L — ABNORMAL LOW (ref 135–145)
TOTAL PROTEIN: 6.8 g/dL (ref 6.5–8.1)

## 2016-09-28 LAB — URINALYSIS, ROUTINE W REFLEX MICROSCOPIC
Bilirubin Urine: NEGATIVE
GLUCOSE, UA: NEGATIVE mg/dL
Ketones, ur: NEGATIVE mg/dL
Nitrite: NEGATIVE
PH: 6.5 (ref 5.0–8.0)
Protein, ur: NEGATIVE mg/dL
Specific Gravity, Urine: <1.005 — ABNORMAL LOW (ref 1.005–1.030)

## 2016-09-28 LAB — I-STAT CHEM 8, ED
BUN: 23 mg/dL — ABNORMAL HIGH (ref 6–20)
CREATININE: 1.1 mg/dL — AB (ref 0.44–1.00)
Calcium, Ion: 1.24 mmol/L (ref 1.15–1.40)
Chloride: 102 mmol/L (ref 101–111)
Glucose, Bld: 156 mg/dL — ABNORMAL HIGH (ref 65–99)
HEMATOCRIT: 36 % (ref 36.0–46.0)
HEMOGLOBIN: 12.2 g/dL (ref 12.0–15.0)
POTASSIUM: 3.8 mmol/L (ref 3.5–5.1)
Sodium: 138 mmol/L (ref 135–145)
TCO2: 26 mmol/L (ref 0–100)

## 2016-09-28 LAB — DIFFERENTIAL
BASOS PCT: 0 %
Basophils Absolute: 0 10*3/uL (ref 0.0–0.1)
EOS ABS: 0.1 10*3/uL (ref 0.0–0.7)
EOS PCT: 1 %
LYMPHS ABS: 2.9 10*3/uL (ref 0.7–4.0)
Lymphocytes Relative: 42 %
Monocytes Absolute: 0.5 10*3/uL (ref 0.1–1.0)
Monocytes Relative: 8 %
NEUTROS PCT: 49 %
Neutro Abs: 3.4 10*3/uL (ref 1.7–7.7)

## 2016-09-28 LAB — APTT: APTT: 31 s (ref 24–36)

## 2016-09-28 LAB — PROTIME-INR
INR: 0.86
Prothrombin Time: 11.7 seconds (ref 11.4–15.2)

## 2016-09-28 LAB — RAPID URINE DRUG SCREEN, HOSP PERFORMED
Amphetamines: NOT DETECTED
Barbiturates: NOT DETECTED
Benzodiazepines: NOT DETECTED
COCAINE: NOT DETECTED
OPIATES: NOT DETECTED
Tetrahydrocannabinol: NOT DETECTED

## 2016-09-28 LAB — URINE MICROSCOPIC-ADD ON

## 2016-09-28 LAB — GLUCOSE, CAPILLARY: Glucose-Capillary: 139 mg/dL — ABNORMAL HIGH (ref 65–99)

## 2016-09-28 LAB — ETHANOL: ALCOHOL ETHYL (B): 5 mg/dL — AB (ref <5)

## 2016-09-28 LAB — CBG MONITORING, ED: Glucose-Capillary: 166 mg/dL — ABNORMAL HIGH (ref 65–99)

## 2016-09-28 MED ORDER — SENNOSIDES-DOCUSATE SODIUM 8.6-50 MG PO TABS: 1.0000 | ORAL_TABLET | Freq: Every evening | ORAL | Status: DC | PRN

## 2016-09-28 MED ORDER — ASPIRIN 300 MG RE SUPP: 300.0000 mg | Freq: Every day | RECTAL | Status: DC

## 2016-09-28 MED ORDER — ENOXAPARIN SODIUM 40 MG/0.4ML ~~LOC~~ SOLN
40.0000 mg | SUBCUTANEOUS | Status: DC
  Administered 2016-09-28 – 2016-09-29 (×2): 40 mg via SUBCUTANEOUS
  Filled 2016-09-28 (×2): qty 0.4

## 2016-09-28 MED ORDER — INSULIN ASPART 100 UNIT/ML ~~LOC~~ SOLN
0.0000 [IU] | Freq: Three times a day (TID) | SUBCUTANEOUS | Status: DC
  Administered 2016-09-29: 3 [IU] via SUBCUTANEOUS

## 2016-09-28 MED ORDER — ATORVASTATIN CALCIUM 80 MG PO TABS
80.0000 mg | ORAL_TABLET | Freq: Every evening | ORAL | Status: DC
  Administered 2016-09-29 – 2016-09-30 (×2): 80 mg via ORAL
  Filled 2016-09-28 (×2): qty 1

## 2016-09-28 MED ORDER — LORAZEPAM 2 MG/ML IJ SOLN
1.0000 mg | Freq: Once | INTRAMUSCULAR | Status: AC
  Administered 2016-09-28: 1 mg via INTRAVENOUS
  Filled 2016-09-28: qty 1

## 2016-09-28 MED ORDER — STROKE: EARLY STAGES OF RECOVERY BOOK
Freq: Once | Status: AC
  Administered 2016-09-28: 23:00:00

## 2016-09-28 MED ORDER — ALBUTEROL SULFATE (2.5 MG/3ML) 0.083% IN NEBU: 2.5000 mg | INHALATION_SOLUTION | Freq: Four times a day (QID) | RESPIRATORY_TRACT | Status: DC | PRN

## 2016-09-28 MED ORDER — ACETAMINOPHEN 325 MG PO TABS: 650.0000 mg | ORAL_TABLET | ORAL | Status: DC | PRN

## 2016-09-28 MED ORDER — PANTOPRAZOLE SODIUM 40 MG PO TBEC
40.0000 mg | DELAYED_RELEASE_TABLET | Freq: Two times a day (BID) | ORAL | Status: DC
  Administered 2016-09-29 – 2016-09-30 (×3): 40 mg via ORAL
  Filled 2016-09-28 (×3): qty 1

## 2016-09-28 MED ORDER — SODIUM CHLORIDE 0.9 % IV SOLN
INTRAVENOUS | Status: DC
  Administered 2016-09-28: 23:00:00 via INTRAVENOUS

## 2016-09-28 MED ORDER — ASPIRIN 300 MG RE SUPP
300.0000 mg | Freq: Once | RECTAL | Status: AC
  Administered 2016-09-28: 300 mg via RECTAL
  Filled 2016-09-28: qty 1

## 2016-09-28 MED ORDER — FLUTICASONE FUROATE-VILANTEROL 100-25 MCG/INH IN AEPB
1.0000 | INHALATION_SPRAY | Freq: Every day | RESPIRATORY_TRACT | Status: DC
  Administered 2016-09-29 – 2016-09-30 (×2): 1 via RESPIRATORY_TRACT
  Filled 2016-09-28: qty 28

## 2016-09-28 MED ORDER — ASPIRIN 325 MG PO TABS
325.0000 mg | ORAL_TABLET | Freq: Every day | ORAL | Status: DC
  Administered 2016-09-29 – 2016-09-30 (×2): 325 mg via ORAL
  Filled 2016-09-28 (×2): qty 1

## 2016-09-28 MED ORDER — HYDROCODONE-ACETAMINOPHEN 10-325 MG PO TABS: 1.0000 | ORAL_TABLET | Freq: Four times a day (QID) | ORAL | Status: DC | PRN

## 2016-09-28 MED ORDER — EZETIMIBE 10 MG PO TABS
10.0000 mg | ORAL_TABLET | Freq: Every evening | ORAL | Status: DC
  Administered 2016-09-29 – 2016-09-30 (×2): 10 mg via ORAL
  Filled 2016-09-28 (×2): qty 1

## 2016-09-28 MED ORDER — ALPRAZOLAM 0.5 MG PO TABS
1.0000 mg | ORAL_TABLET | Freq: Three times a day (TID) | ORAL | Status: DC
  Administered 2016-09-29 – 2016-09-30 (×4): 1 mg via ORAL
  Filled 2016-09-28 (×5): qty 2

## 2016-09-28 MED ORDER — ACETAMINOPHEN 650 MG RE SUPP: 650.0000 mg | RECTAL | Status: DC | PRN

## 2016-09-28 MED ORDER — OXYBUTYNIN 3.9 MG/24HR TD PTTW
1.0000 | MEDICATED_PATCH | TRANSDERMAL | Status: DC
  Administered 2016-09-30: 1 via TRANSDERMAL
  Filled 2016-09-28: qty 1

## 2016-09-28 MED ORDER — DARIFENACIN HYDROBROMIDE ER 15 MG PO TB24
15.0000 mg | ORAL_TABLET | Freq: Every day | ORAL | Status: DC
Start: 2016-09-29 — End: 2016-09-28

## 2016-09-28 NOTE — ED Notes (Signed)
Pt taken to MRI  

## 2016-09-28 NOTE — Progress Notes (Signed)
CODE STROKE 16:40  Phone call x 2 16:44 Beeper 16:47 exam began 16:50 exam finished 16:51 images to SOC,exam completed in [facility], and [facility] radiology called - spoke with Erline Levine.
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
## 2016-09-28 NOTE — ED Notes (Signed)
Per patient.  Pt c/o off and on right sided weakness and numbness since yesterday morning.  States she has woke up this way the past 2 mornings and both times the symptoms went away after laying down.  She states her symptoms came back today at 3 pm.  States she called the triage nurse and was told to dial 911,  But her symptoms did improve slightly after calling.

## 2016-09-28 NOTE — Telephone Encounter (Signed)
Gave call to triage °

## 2016-09-28 NOTE — Progress Notes (Signed)
Patient arrived to floor via [facility]. Report was received from [facility] at [facility] ED. Patient A&O x4, vitals stable, tele applied and verified, oriented to room.  Family at bedside.  Admission completed.  Continue to monitor.

## 2016-09-28 NOTE — Telephone Encounter (Signed)
Patient called complaining of numbness and tingling in right side of body, right arm and in face.  Patient reports this began happening this morning and has continued sporadically through the afternoon.  Patient was advised that she needed to be seen and evaluated in the closest ER.  That this could be caused by several things and she may need a scan that we could not do here.  Patient voices understanding.

## 2016-09-28 NOTE — ED Notes (Signed)
Teleneuro in progress at this time.

## 2016-09-28 NOTE — ED Notes (Signed)
Code stroke called by Dr. Roderic Palau

## 2016-09-28 NOTE — ED Notes (Signed)
Neurologist wants to cancel code stroke at this time.

## 2016-09-28 NOTE — ED Triage Notes (Signed)
Weakness and facial numbness off and on since yesterday.  States she woke up with these symptoms this morning.
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
## 2016-09-29 ENCOUNTER — Observation Stay (HOSPITAL_BASED_OUTPATIENT_CLINIC_OR_DEPARTMENT_OTHER): Payer: Commercial Managed Care - HMO

## 2016-09-29 DIAGNOSIS — G459 Transient cerebral ischemic attack, unspecified: Secondary | ICD-10-CM

## 2016-09-29 DIAGNOSIS — I10 Essential (primary) hypertension: Secondary | ICD-10-CM

## 2016-09-29 DIAGNOSIS — R531 Weakness: Secondary | ICD-10-CM | POA: Diagnosis not present

## 2016-09-29 LAB — ECHOCARDIOGRAM COMPLETE
CHL CUP MV DEC (S): 275
E decel time: 275 msec
FS: 46 % — AB (ref 28–44)
Height: 62 in
IVS/LV PW RATIO, ED: 1
LA diam end sys: 42 mm
LA vol A4C: 45.6 ml
LA vol: 40.1 mL
LADIAMINDEX: 2.27 cm/m2
LASIZE: 42 mm
LAVOLIN: 21.7 mL/m2
LDCA: 3.14 cm2
LV TDI E'LATERAL: 4.68
LV e' LATERAL: 4.68 cm/s
LVOTD: 20 mm
Lateral S' vel: 13.5 cm/s
MV pk E vel: 0.7 m/s
PW: 11 mm — AB (ref 0.6–1.1)
TAPSE: 21.2 mm
TDI e' medial: 7.43
WEIGHTICAEL: 2960 [oz_av]

## 2016-09-29 LAB — LIPID PANEL
CHOL/HDL RATIO: 5 ratio
CHOLESTEROL: 232 mg/dL — AB (ref 0–200)
HDL: 46 mg/dL (ref >40)
LDL Cholesterol: 163 mg/dL — ABNORMAL HIGH (ref 0–99)
Triglycerides: 113 mg/dL (ref <150)
VLDL: 23 mg/dL (ref 0–40)

## 2016-09-29 LAB — GLUCOSE, CAPILLARY
GLUCOSE-CAPILLARY: 183 mg/dL — AB (ref 65–99)
Glucose-Capillary: 127 mg/dL — ABNORMAL HIGH (ref 65–99)
Glucose-Capillary: 134 mg/dL — ABNORMAL HIGH (ref 65–99)
Glucose-Capillary: 166 mg/dL — ABNORMAL HIGH (ref 65–99)
Glucose-Capillary: 169 mg/dL — ABNORMAL HIGH (ref 65–99)

## 2016-09-29 MED ORDER — SODIUM CHLORIDE 0.9 % IV SOLN
INTRAVENOUS | Status: AC
  Administered 2016-09-29: 12:00:00 via INTRAVENOUS

## 2016-09-29 NOTE — Evaluation (Addendum)
Occupational Therapy Evaluation Patient Details Name: Yvonne [name] MRN: [id number] DOB: Oct 22, 1951 Today's Date: 09/29/2016    History of Present Illness 65 y.o. female with medical history significant of HTN, HLD, and DM presenting with concern for CVA.  Patient reports yesterday AM woke up and felt like entire right side was asleep.  Felt bad throughout the day but ignored it.  This AM, she awoke and it was worse.  MRA and MRI unremarkable   Clinical Impression   Pt reports she was independent with ADL PTA. Currently pt overall min assist for ADL and functional mobility with the exception of mod assist for LB dressing. Pt presenting with decreased RUE ROM/strength/coordination/sensation, poor standing balance, ?mild R inattention impacting her independence and safety with ADL and functional mobility. Feel pt could benefit from CIR level therapies to maximize independence and safety with ADL and functional mobility prior to return home. If pt unable to d/c to CIR, she will need HHOT for follow up. Pt would benefit from continued skilled OT to address established goals.    Follow Up Recommendations  CIR;Supervision/Assistance - 24 hour (currently obs status; will need HHOT if cannot d/c to CIR)    Equipment Recommendations  3 in 1 bedside comode    Recommendations for Other Services Rehab consult     Precautions / Restrictions Precautions Precautions: Fall Restrictions Weight Bearing Restrictions: No      Mobility Bed Mobility Overal bed mobility: Modified Independent             General bed mobility comments: HOB elevated  Transfers Overall transfer level: Needs assistance Equipment used: Rolling walker (2 wheeled) Transfers: Sit to/from Stand Sit to Stand: Min guard         General transfer comment: Min guard for safety; steadying balance in standing.    Balance Overall balance assessment: Needs assistance Sitting-balance support: Feet supported;No upper  extremity supported Sitting balance-Leahy Scale: Good     Standing balance support: No upper extremity supported;During functional activity Standing balance-Leahy Scale: Poor Standing balance comment: Min assist for standing balance without UE support during grooming activity.                            ADL Overall ADL's : Needs assistance/impaired Eating/Feeding: Modified independent;Sitting Eating/Feeding Details (indicate cue type and reason): Pt reports she was able to self feed lunch but did have difficulty using R hand; may benefit from built up handles. Grooming: Minimal assistance;Standing;Oral care Grooming Details (indicate cue type and reason): Pt able to manipulate toothbrush and toothpaste with bil hands. Min assist for standing balance. Upper Body Bathing: Minimal assitance;Sitting   Lower Body Bathing: Minimal assistance;Sit to/from stand   Upper Body Dressing : Minimal assistance;Sitting   Lower Body Dressing: Moderate assistance;Sit to/from stand   Toilet Transfer: Minimal assistance;Ambulation;BSC;RW Toilet Transfer Details (indicate cue type and reason): Simulated by sit to stand from EOB with functional mobility.         Functional mobility during ADLs: Minimal assistance;Rolling walker General ADL Comments: Pt with RLE fatigue during functional mobility. Encouraged functional use of RUE; pt able to demo good understanding.      Vision Additional Comments: Pt running into wall/objects with RW on R side during functional mobility in hallway.   Perception     Praxis      Pertinent Vitals/Pain Pain Assessment: No/denies pain     Hand Dominance Right   Extremity/Trunk Assessment Upper Extremity Assessment Upper  Extremity Assessment: RUE deficits/detail RUE Deficits / Details: Full PROM. ~20 degrees active shoulder flex. Full elbow AROM anti gravity. Grossly 3-/5 strength. Decreased grip strength. Pt report tingling sensation. RUE Sensation:  decreased light touch RUE Coordination: decreased fine motor;decreased gross motor   Lower Extremity Assessment Lower Extremity Assessment: Defer to PT evaluation   Cervical / Trunk Assessment Cervical / Trunk Assessment: Normal   Communication Communication Communication: No difficulties   Cognition Arousal/Alertness: Awake/alert Behavior During Therapy: WFL for tasks assessed/performed Overall Cognitive Status: Within Functional Limits for tasks assessed                     General Comments       Exercises       Shoulder Instructions      Home Living Family/patient expects to be discharged to:: Private residence Living Arrangements: Spouse/significant other;Other relatives Available Help at Discharge: Family;Available 24 hours/day Type of Home: House       Home Layout: One level     Bathroom Shower/Tub: Tub/shower unit Shower/tub characteristics: Architectural technologist: Standard     Home Equipment: None          Prior Functioning/Environment Level of Independence: Independent                 OT Problem List: Decreased strength;Decreased range of motion;Impaired balance (sitting and/or standing);Impaired vision/perception;Decreased coordination;Decreased safety awareness;Decreased knowledge of use of DME or AE;Decreased knowledge of precautions;Impaired sensation;Impaired UE functional use   OT Treatment/Interventions: Self-care/ADL training;Therapeutic exercise;Neuromuscular education;Energy conservation;DME and/or AE instruction;Therapeutic activities;Patient/family education;Balance training    OT Goals(Current goals can be found in the care plan section) Acute Rehab OT Goals Patient Stated Goal: to get home safety - to be wtih her grandchildren and watch them grow up OT Goal Formulation: With patient/family Time For Goal Achievement: 10/13/16 Potential to Achieve Goals: Good ADL Goals Pt Will Perform Eating: with modified  independence;with adaptive utensils;sitting Pt Will Perform Grooming: with supervision;standing Pt Will Perform Upper Body Bathing: with set-up;with supervision;sitting Pt Will Perform Lower Body Bathing: with supervision;sit to/from stand Pt Will Transfer to Toilet: with supervision;ambulating;bedside commode Pt Will Perform Toileting - Clothing Manipulation and hygiene: with supervision;sit to/from stand Pt/caregiver will Perform Home Exercise Program: Increased ROM;Increased strength;Right Upper extremity;Independently;With written HEP provided  OT Frequency: Min 2X/week   Barriers to D/C:            Co-evaluation              End of Session Equipment Utilized During Treatment: Gait belt;Rolling walker Nurse Communication: Mobility status;Other (comment) (pt sitting on couch with husband)  Activity Tolerance: Patient tolerated treatment well Patient left: Other (comment);with family/visitor present (sitting on couch with husband)   Time: 1441-1501 OT Time Calculation (min): 20 min Charges:  OT General Charges $OT Visit: 1 Procedure OT Evaluation $OT Eval Moderate Complexity: 1 Procedure G-Codes: OT G-codes **NOT FOR INPATIENT CLASS** Functional Assessment Tool Used: Clinical judgement Functional Limitation: Self care Self Care Current Status [id number]): At least 20 percent but less than 40 percent impaired, limited or restricted Self Care Goal Status [id number]): At least 1 percent but less than 20 percent impaired, limited or restricted   Binnie Kand M.S., OTR/L Pager: [phone]  09/29/2016, 3:23 PM

## 2016-09-29 NOTE — Progress Notes (Signed)
  Echocardiogram 2D Echocardiogram has been performed.  Yvonne [name] 09/29/2016, 11:59 AM

## 2016-09-29 NOTE — Progress Notes (Signed)
*  PRELIMINARY RESULTS* Vascular Ultrasound Carotid Duplex (Doppler) has been completed.  Findings suggest 1-39% internal carotid artery stenosis bilaterally. Vertebral arteries are patent with antegrade flow.  09/29/2016 11:05 AM Maudry Mayhew, BS, RVT, RDCS, RDMS

## 2016-09-29 NOTE — Progress Notes (Signed)
Received a call back from vascular that patient is on their list for today.

## 2016-09-29 NOTE — Progress Notes (Signed)
STROKE TEAM PROGRESS NOTE   HISTORY OF PRESENT ILLNESS (per record) Yvonne [name] is an 65 y.o. female history diabetes mellitus, hypertension and hyperlipidemia who presented to the ED at [facility] with a complaint of right-sided numbness and weakness which was first noticed on the morning of 09/27/2016. She noticed worsening when she woke up on 09/28/2016 and decided to seek medical attention. She describes right facial numbness in addition to extremity numbness and weakness. No facial droop was described. CT scan of her head showed no acute intracranial abnormality. MRI and MRA of the brain was unremarkable. Deficits have markedly improved at this point with only minimal residual sensory symptoms involving right corner of mouth and right hand. NIH stroke score at the time of this evaluation was 0.  LSN: 09/27/2016 tPA Given: No: Beyond time under for treatment consideration; resolving deficits mRankin:  SUBJECTIVE (INTERVAL HISTORY) Her family was at the bedside.  Overall she feels her condition impoved.    OBJECTIVE Temp:  [97.7 F (36.5 C)-98.3 F (36.8 C)] 98.3 F (36.8 C) (11/04 1418) Pulse Rate:  [60-79] 71 (11/04 1418) Cardiac Rhythm: Normal sinus rhythm (11/04 0700) Resp:  [15-23] 15 (11/04 1418) BP: (136-186)/(67-100) 143/72 (11/04 1418) SpO2:  [93 %-99 %] 94 % (11/04 1418) Weight:  [83.9 kg (185 lb)] 83.9 kg (185 lb) (11/03 1635)  CBC:   Recent Labs Lab 09/28/16 1645 09/28/16 1653  WBC 7.0  --   NEUTROABS 3.4  --   HGB 13.0 12.2  HCT 38.2 36.0  MCV 87.0  --   PLT 214  --     Basic Metabolic Panel:   Recent Labs Lab 09/28/16 1645 09/28/16 1653  NA 134* 138  K 3.7 3.8  CL 102 102  CO2 26  --   GLUCOSE 158* 156*  BUN 23* 23*  CREATININE 1.10* 1.10*  CALCIUM 9.5  --     Lipid Panel:     Component Value Date/Time   CHOL 232 (H) 09/29/2016 0005   CHOL 183 05/03/2016 0807   TRIG 113 09/29/2016 0005   HDL 46 09/29/2016 0005   HDL 52 05/03/2016 0807   CHOLHDL 5.0 09/29/2016 0005   VLDL 23 09/29/2016 0005   LDLCALC 163 (H) 09/29/2016 0005   LDLCALC 109 (H) 05/03/2016 0807   HgbA1c:  Lab Results  Component Value Date   HGBA1C 6.9 11/04/2015   Urine Drug Screen:     Component Value Date/Time   LABOPIA NONE DETECTED 09/28/2016 2019   COCAINSCRNUR NONE DETECTED 09/28/2016 2019   LABBENZ NONE DETECTED 09/28/2016 2019   AMPHETMU NONE DETECTED 09/28/2016 2019   THCU NONE DETECTED 09/28/2016 2019   LABBARB NONE DETECTED 09/28/2016 2019      IMAGING  Ct Head Wo Contrast 09/28/2016 No intracranial abnormality.   Mr Virgel Paling Wo Contrast 09/28/2016 Negative MRI head for age.  Negative MRA head.     PHYSICAL EXAM HEENT-  Normocephalic, no lesions, without obvious abnormality.  Normal external eye and conjunctiva.  Normal TM's bilaterally.  Normal auditory canals and external ears. Normal external nose, mucus membranes and septum.  Normal pharynx. Neck supple with no masses, nodes, nodules or enlargement. Cardiovascular - regular rate and rhythm, S1, S2 normal, no murmur, click, rub or gallop Lungs - chest clear, no wheezing, rales, normal symmetric air entry Abdomen - soft, non-tender; bowel sounds normal; no masses,  no organomegaly Extremities - no joint deformities, effusion, or inflammation and no edema  Neurologic Examination: Mental Status: Alert, oriented, no  acute distress, depressed affect.  Speech fluent without evidence of aphasia. Able to follow commands without difficulty. Cranial Nerves: II-Visual fields were normal. III/IV/VI-Pupils were equal and reacted normally to light. Extraocular movements were full and conjugate.    V/VII-no facial numbness and no facial weakness. VIII-normal. X-normal speech and symmetrical palatal movement. XI: trapezius strength/neck flexion strength normal bilaterally XII-midline tongue extension with normal strength. Motor: 5/5 bilaterally with normal tone and bulk Sensory:  Normal throughout. Deep Tendon Reflexes: Trace to 1+ and symmetric. Plantars: Mute bilaterally Cerebellar: Normal finger-to-nose testing. Carotid auscultation: Normal     ASSESSMENT/PLAN [name] is a 65 y.o. female with history of hypertension, hyperlipidemia, diabetes mellitus, chronic headaches, asthma, and anxiety  presenting with right-sided weakness and numbness.  She did not receive IV t-PA due to late presentation.  TIA versus atypical migraine:  Dominant   Resultant right sided numbness  MRI - negative  MRA - negative  Carotid Doppler - 1-39% internal carotid artery stenosis bilaterally. Vertebral arteries are patent with antegrade flow.  2D Echo - EF 65-70%. No cardiac source for emboli identified.  LDL - 163  HgbA1c pending  VTE prophylaxis - Lovenox Diet Carb Modified Fluid consistency: Thin; Room service appropriate? Yes  No antithrombotic prior to admission, now on aspirin 325 mg daily  Patient counseled to be compliant with her antithrombotic medications  Ongoing aggressive stroke risk factor management  Therapy recommendations: Inpatient rehabilitation recommended  Disposition: Pending  Hypertension  Stable  Permissive hypertension (OK if < 220/120) but gradually normalize in 5-7 days  Long-term BP goal normotensive  Hyperlipidemia  Home meds:  Lipitor 80 mg daily and Zetia 10 mg daily resumed in hospital  LDL 163, goal < 70  Verify compliance  Continue statin at discharge  Diabetes  HgbA1c pending, goal < 7.0  Uncontrolled  Other Stroke Risk Factors  Advanced age  Obesity, Body mass index is 33.84 kg/m., recommend weight loss, diet and exercise as appropriate   Family hx stroke (mother)   Other Active Problems  BUN 23 / creatinine 1.10  Hospital day # 0  ATTENDING NOTE: Patient was seen and examined by me personally. Documentation reflects findings. The laboratory and radiographic studies reviewed by me. ROS  completed by me personally and pertinent positives fully documented  Condition: stable  Assessment and plan completed by me personally and fully documented above. Plans/Recommendations include:      Exam sill demonstrates some numbness and weakness on the right which is not structurally explained by the negative MRI.    PT/OT assessing  Stroke/TIA work-up essentially complete.  Patient can be followed by [facility] Neurology     SIGNED BY: Dr. Elissa Hefty    To contact Stroke Continuity provider, please refer to [email or web address] After hours, contact General Neurology
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
## 2016-09-29 NOTE — Progress Notes (Signed)
PROGRESS NOTE    RHIONNA GIFFIN  [id number] DOB: 21-Oct-1951 DOA: 09/28/2016 PCP: [name], MD  Brief Narrative: Chief Complaint: Right arm/leg weakness HPI: OSCEOLA WEVER is a 65 y.o. female with medical history significant of HTN, HLD, and DM presenting with concern for CVA.  Patient reports 11/2 AM woke up and felt like entire right side was asleep. Took mother on an errand and then returned and had recurrent numbness/tingling on the right again.  She called the doctor and they told her to call 911 and come here.  Now with R arm numbness and weakness of R arm and leg.   Assessment & Plan:  R sided Numbness and Weakness -TIA versus small CVA due to persistent residual symptoms -with residual numbness of RUE and face -MRI normal and MRA without large vessel occlusion -Follow-up Carotid dopplers and Echo -Continue aspirin -LDL is 163 on maximal dose of atorvastatin and zetia -Neurology consulting -PT/OT/SLP consulted  HTN -Allow permissive HTN -Hold CCB and plan to restart in 48-72 hours  HLD -Takes max Lipitor as well as Zetia daily  DM -Hold Metformin, SSI, fu A1c  AKI -Likely associated with decreased PO intake -hydrate for 6h, bmet in am  DVT prophylaxis: Lovenox Code Status: DNR -  Family Communication: No family at bedside Disposition Plan:  Home when workup completed  Consultants:  NEuro  Subjective: Still with mild R arm and RUE numbness  Objective: Vitals:   09/29/16 0728 09/29/16 0808 09/29/16 1015 09/29/16 1020  BP:  (!) 157/71 (!) 147/80 (!) 170/85  Pulse:  66 79   Resp:      Temp:      TempSrc:      SpO2: 96% 96% 98%   Weight:      Height:        Intake/Output Summary (Last 24 hours) at 09/29/16 1148 Last data filed at 09/29/16 0500  Gross per 24 hour  Intake           296.67 ml  Output                0 ml  Net           296.67 ml   Filed Weights   09/28/16 1635  Weight: 83.9 kg (185 lb)    Examination:  General exam:  Appears calm and comfortable, AAOx3 Respiratory system: Clear to auscultation. Respiratory effort normal. Cardiovascular system: S1 & S2 heard, RRR. No JVD, murmurs, rubs, gallops or clicks. No pedal edema. Gastrointestinal system: Abdomen is nondistended, soft and nontender. No organomegaly or masses felt. Normal bowel sounds heard. Central nervous system: Alert and oriented. Motor 5/5, cranial nerves intact, decreased light touch on face and RUE, mild pronator drift of R arm Extremities: Symmetric 5 x 5 power. Skin: No rashes, lesions or ulcers Psychiatry: Judgement and insight appear normal. Mood & affect appropriate.     Data Reviewed: I have personally reviewed following labs and imaging studies  CBC:  Recent Labs Lab 09/28/16 1645 09/28/16 1653  WBC 7.0  --   NEUTROABS 3.4  --   HGB 13.0 12.2  HCT 38.2 36.0  MCV 87.0  --   PLT 214  --    Basic Metabolic Panel:  Recent Labs Lab 09/28/16 1645 09/28/16 1653  NA 134* 138  K 3.7 3.8  CL 102 102  CO2 26  --   GLUCOSE 158* 156*  BUN 23* 23*  CREATININE 1.10* 1.10*  CALCIUM 9.5  --  GFR: Estimated Creatinine Clearance: 50.5 mL/min (by C-G formula based on SCr of 1.1 mg/dL (H)). Liver Function Tests:  Recent Labs Lab 09/28/16 1645  AST 21  ALT 23  ALKPHOS 116  BILITOT 0.5  PROT 6.8  ALBUMIN 3.9   No results for input(s): LIPASE, AMYLASE in the last 168 hours. No results for input(s): AMMONIA in the last 168 hours. Coagulation Profile:  Recent Labs Lab 09/28/16 1645  INR 0.86   Cardiac Enzymes: No results for input(s): CKTOTAL, CKMB, CKMBINDEX, TROPONINI in the last 168 hours. BNP (last 3 results) No results for input(s): PROBNP in the last 8760 hours. HbA1C: No results for input(s): HGBA1C in the last 72 hours. CBG:  Recent Labs Lab 09/28/16 1633 09/28/16 2351 09/29/16 0439 09/29/16 0802  GLUCAP 166* 139* 127* 134*   Lipid Profile:  Recent Labs  09/29/16 0005  CHOL 232*  HDL 46    LDLCALC 163*  TRIG 113  CHOLHDL 5.0   Thyroid Function Tests: No results for input(s): TSH, T4TOTAL, FREET4, T3FREE, THYROIDAB in the last 72 hours. Anemia Panel: No results for input(s): VITAMINB12, FOLATE, FERRITIN, TIBC, IRON, RETICCTPCT in the last 72 hours. Urine analysis:    Component Value Date/Time   COLORURINE YELLOW 09/28/2016 2019   APPEARANCEUR CLEAR 09/28/2016 2019   APPEARANCEUR Clear 09/14/2016 1158   LABSPEC <1.005 (L) 09/28/2016 2019   PHURINE 6.5 09/28/2016 2019   GLUCOSEU NEGATIVE 09/28/2016 2019   HGBUR TRACE (A) 09/28/2016 2019   BILIRUBINUR NEGATIVE 09/28/2016 2019   BILIRUBINUR Negative 09/14/2016 [place] 09/28/2016 2019   PROTEINUR NEGATIVE 09/28/2016 2019   NITRITE NEGATIVE 09/28/2016 2019   LEUKOCYTESUR SMALL (A) 09/28/2016 2019   LEUKOCYTESUR 1+ (A) 09/14/2016 1158   Sepsis Labs: @LABRCNTIP (procalcitonin:4,lacticidven:4)  )No results found for this or any previous visit (from the past 240 hour(s)).       Radiology Studies: Ct Head Wo Contrast  Result Date: 09/28/2016 CLINICAL DATA:  Intermittent right side numbness, tingling since yesterday. EXAM: CT HEAD WITHOUT CONTRAST TECHNIQUE: Contiguous axial images were obtained from the base of the skull through the vertex without intravenous contrast. COMPARISON:  None. FINDINGS: Brain: No acute intracranial abnormality. Specifically, no hemorrhage, hydrocephalus, mass lesion, acute infarction, or significant intracranial injury. Vascular: No hyperdense vessel or unexpected calcification. Skull: Normal. Negative for fracture or focal lesion. Sinuses/Orbits: No acute findings Other: None IMPRESSION: No intracranial abnormality. Electronically Signed   By: Rolm Baptise M.D.   On: 09/28/2016 16:56   Mr Jodene Nam Head Wo Contrast  Result Date: 09/28/2016 CLINICAL DATA:  RIGHT-sided numbness for 2 days. History of hypertension, diabetes, chronic headaches. EXAM: MRI HEAD WITHOUT CONTRAST MRA HEAD  WITHOUT CONTRAST TECHNIQUE: Multiplanar, multiecho pulse sequences of the brain and surrounding structures were obtained without intravenous contrast. Angiographic images of the head were obtained using MRA technique without contrast. COMPARISON:  CT HEAD September 28, 2016 at 1648 hours FINDINGS: MRI HEAD FINDINGS BRAIN: No reduced diffusion to suggest acute ischemia. No susceptibility artifact to suggest hemorrhage. The ventricles and sulci are normal for patient's age. No suspicious parenchymal signal, masses or mass effect. No abnormal extra-axial fluid collections. No extra-axial masses though, contrast enhanced sequences would be more sensitive. VASCULAR: Normal major intracranial vascular flow voids present at skull base. SKULL AND UPPER CERVICAL SPINE: No abnormal sellar expansion. No suspicious calvarial bone marrow signal. Craniocervical junction maintained. SINUSES/ORBITS: Atretic LEFT maxillary sinus consistent with chronic sinusitis. LEFT sphenoid sinus mucosal thickening. Small mastoid effusions. The included ocular globes  and orbital contents are non-suspicious. Status post RIGHT ocular lens implant. OTHER: None. MRA HEAD FINDINGS ANTERIOR CIRCULATION: Normal flow related enhancement of the included cervical, petrous, cavernous and supraclinoid internal carotid arteries. Patent anterior communicating artery. Normal flow related enhancement of the anterior and middle cerebral arteries, including distal segments. No large vessel occlusion, high-grade stenosis, abnormal luminal irregularity, aneurysm. POSTERIOR CIRCULATION: LEFT vertebral artery is dominant. Basilar artery is patent, with normal flow related enhancement of the main branch vessels. Normal flow related enhancement of the posterior cerebral arteries. No large vessel occlusion, high-grade stenosis, abnormal luminal irregularity, aneurysm. IMPRESSION: Negative MRI head for age. Negative MRA head. Electronically Signed   By: Elon Alas  M.D.   On: 09/28/2016 18:53   Mr Brain Wo Contrast  Result Date: 09/28/2016 CLINICAL DATA:  RIGHT-sided numbness for 2 days. History of hypertension, diabetes, chronic headaches. EXAM: MRI HEAD WITHOUT CONTRAST MRA HEAD WITHOUT CONTRAST TECHNIQUE: Multiplanar, multiecho pulse sequences of the brain and surrounding structures were obtained without intravenous contrast. Angiographic images of the head were obtained using MRA technique without contrast. COMPARISON:  CT HEAD September 28, 2016 at 1648 hours FINDINGS: MRI HEAD FINDINGS BRAIN: No reduced diffusion to suggest acute ischemia. No susceptibility artifact to suggest hemorrhage. The ventricles and sulci are normal for patient's age. No suspicious parenchymal signal, masses or mass effect. No abnormal extra-axial fluid collections. No extra-axial masses though, contrast enhanced sequences would be more sensitive. VASCULAR: Normal major intracranial vascular flow voids present at skull base. SKULL AND UPPER CERVICAL SPINE: No abnormal sellar expansion. No suspicious calvarial bone marrow signal. Craniocervical junction maintained. SINUSES/ORBITS: Atretic LEFT maxillary sinus consistent with chronic sinusitis. LEFT sphenoid sinus mucosal thickening. Small mastoid effusions. The included ocular globes and orbital contents are non-suspicious. Status post RIGHT ocular lens implant. OTHER: None. MRA HEAD FINDINGS ANTERIOR CIRCULATION: Normal flow related enhancement of the included cervical, petrous, cavernous and supraclinoid internal carotid arteries. Patent anterior communicating artery. Normal flow related enhancement of the anterior and middle cerebral arteries, including distal segments. No large vessel occlusion, high-grade stenosis, abnormal luminal irregularity, aneurysm. POSTERIOR CIRCULATION: LEFT vertebral artery is dominant. Basilar artery is patent, with normal flow related enhancement of the main branch vessels. Normal flow related enhancement of the  posterior cerebral arteries. No large vessel occlusion, high-grade stenosis, abnormal luminal irregularity, aneurysm. IMPRESSION: Negative MRI head for age. Negative MRA head. Electronically Signed   By: Elon Alas M.D.   On: 09/28/2016 18:53        Scheduled Meds: . ALPRAZolam  1 mg Oral TID  . aspirin  300 mg Rectal Daily   Or  . aspirin  325 mg Oral Daily  . atorvastatin  80 mg Oral QPM  . enoxaparin (LOVENOX) injection  40 mg Subcutaneous Q24H  . ezetimibe  10 mg Oral QPM  . fluticasone furoate-vilanterol  1 puff Inhalation Daily  . insulin aspart  0-15 Units Subcutaneous TID WC  . [START ON 09/30/2016] oxybutynin  1 patch Transdermal Q72H  . pantoprazole  40 mg Oral BID   Continuous Infusions: . sodium chloride 50 mL/hr at 09/28/16 2304     LOS: 0 days    Time spent: 39min    Domenic Polite, MD [facility] Pager [phone]  If 7PM-7AM, please contact night-coverage [email or web address] Password [email or web address] 09/29/2016, 11:48 AM

## 2016-09-29 NOTE — Progress Notes (Signed)
PT and OT are recommending IP rehab.  Pt's MRI/MRA were unremarkable and pt. currently under observation status.  We are not recommending an IP Rehab consult at this time as neuro feels pt. likely has a resolving TIA, and her insurance would not authorize an IP Rehab admission.    [place] Admissions Coordinator Cell [phone] Office [phone]

## 2016-09-29 NOTE — Evaluation (Signed)
Physical Therapy Evaluation Patient Details Name: Yvonne [name] MRN: [id number] DOB: [birth year] Today's Date: 09/29/2016   History of Present Illness  65 y.o. female with medical history significant of HTN, HLD, and DM presenting with concern for CVA.  Patient reports yesterday AM woke up and felt like entire right side was asleep.  Felt bad throughout the day but ignored it.  This AM, she awoke and it was worse.  MRA and MRI unremarkable  Clinical Impression  Pt did 2 walks with me - one with no device and one with RW.  Pt is high risk of falls - did better with RW. She will need assist for all mobilty for safety.  Pt tested 23/56 on BERG balance assessment.  Pt with right leg weakness  Focus on fall prevention education with pt. She knows not to get up alone.  Would benefit from intensive therapy to help her return to her independent status    Follow Up Recommendations CIR;Supervision/Assistance - 24 hour (CIR would be best for pt.)    Equipment Recommendations  Rolling walker with 5" wheels    Recommendations for Other Services Rehab consult     Precautions / Restrictions Precautions Precautions: Fall Precaution Comments: no history of falls Restrictions Weight Bearing Restrictions: No      Mobility  Bed Mobility Overal bed mobility: Independent                Transfers Overall transfer level: Needs assistance Equipment used: Rolling walker (2 wheeled) Transfers: Sit to/from Stand           General transfer comment: pt needed cues for hand placement for sit to stands.  pt needed cues for safely backing all the way up before sitting.  Ambulation/Gait Ambulation/Gait assistance: Min assist Ambulation Distance (Feet):  (pt walked 50 feet twice) Assistive device: Rolling walker (2 wheeled) Gait Pattern/deviations: Decreased stance time - right;Decreased dorsiflexion - right;Decreased step length - right     General Gait Details: pt took first walk without device.   pt with loss of balance to right several times esp with turning.  pt needed assist to maintain her balance.  pt rested and then walked again with RW -she was safer wtih RW (but had hard time feeling right hand on walker but was abel to keep it on handle.  pt lost balance turnign with RW -she had feet to narrow - cued for wider base.  pts right ankle appeared to want to supinate in standing - with hospital socks on - will need more supportive shoes.  Stairs            Wheelchair Mobility    Modified Rankin (Stroke Patients Only)       Balance                                 Standardized Balance Assessment Standardized Balance Assessment : Berg Balance Test Berg Balance Test Sit to Stand: Able to stand  independently using hands Standing Unsupported: Able to stand safely 2 minutes Sitting with Back Unsupported but Feet Supported on Floor or Stool: Able to sit safely and securely 2 minutes Stand to Sit: Controls descent by using hands Transfers: Able to transfer safely, definite need of hands Standing Unsupported with Eyes Closed: Able to stand 3 seconds Standing Ubsupported with Feet Together: Needs help to attain position and unable to hold for 15 seconds From Standing, Reach Forward with Outstretched  Arm: Reaches forward but needs supervision From Standing Position, Pick up Object from Floor: Unable to pick up and needs supervision From Standing Position, Turn to Look Behind Over each Shoulder: Needs supervision when turning Turn 360 Degrees: Needs assistance while turning Standing Unsupported, Alternately Place Feet on Step/Stool: Needs assistance to keep from falling or unable to try Standing Unsupported, One Foot in Front: Needs help to step but can hold 15 seconds Standing on One Leg: Unable to try or needs assist to prevent fall Total Score: 23         Pertinent Vitals/Pain Pain Assessment: No/denies pain    Home Living Family/patient expects to be  discharged to:: Private residence Living Arrangements: Spouse/significant other;Other relatives (pt lives with son and 3 grandchildren.  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  pt was surprised it was weaker than left LLE Deficits / Details: left leg grossly 5/5  Cervical / Trunk Assessment: Normal  Communication   Communication: No difficulties  Cognition Arousal/Alertness: Awake/alert Behavior During Therapy: WFL for tasks assessed/performed Overall Cognitive Status: Within Functional Limits for tasks assessed                      General Comments General comments (skin integrity, edema, etc.): pt lost balance posterior initially with eyes closed.  pt unabel to balance with feet together - wanting to fall to right.  looses balance to right with head turns.  Pt had hard time standing in staggered stance - wanted to loose balance to right - right ankle wanting to supinate.  Pt unable to do right leg stance activities.    Exercises     Assessment/Plan    PT Assessment Patient needs continued PT services  PT Problem List Decreased strength;Decreased balance;Decreased activity tolerance;Decreased mobility;Decreased knowledge of use of DME;Decreased safety awareness;Impaired sensation;Decreased knowledge of precautions          PT Treatment Interventions DME instruction;Gait training;Stair training;Functional mobility training;Therapeutic  activities;Therapeutic exercise;Balance training;Neuromuscular re-education;Patient/family education    PT Goals (Current goals can be found in the Care Plan section)  Acute Rehab PT Goals Patient Stated Goal: to get home safety - to be wtih her grandchildren and watch them grow up PT Goal Formulation: With patient Time For Goal Achievement: 10/06/16 Potential to Achieve Goals: Good    Frequency Min 4X/week   Barriers to discharge        Co-evaluation               End of Session Equipment Utilized During Treatment: Gait belt Activity Tolerance: Patient tolerated treatment well Patient left: in chair;with call bell/phone within reach Nurse Communication: Mobility status    Functional Assessment Tool Used: from clinical observation Functional Limitation: Mobility: Walking and moving around Mobility: Walking and Moving Around Current  Status [phone]): At least 20 percent but less than 40 percent impaired, limited or restricted Mobility: Walking and Moving Around Goal Status [phone]): At least 1 percent but less than 20 percent impaired, limited or restricted    Time: 0940-1010 PT Time Calculation (min) (ACUTE ONLY): 30 min   Charges:   PT Evaluation $PT Eval Low Complexity: 1 Procedure PT Treatments $Gait Training: 8-22 mins   PT G Codes:   PT G-Codes **NOT FOR INPATIENT CLASS** Functional Assessment Tool Used: from clinical observation Functional Limitation: Mobility: Walking and moving around Mobility: Walking and Moving Around Current Status [id number]): At least 20 percent but less than 40 percent impaired, limited or restricted Mobility: Walking and Moving Around Goal Status [phone]): At least 1 percent but less than 20 percent impaired, limited or restricted    Loyal Buba 09/29/2016, 10:39 AM

## 2016-09-29 NOTE — Progress Notes (Signed)
Called operator to page vascular regarding patient's test. Awaiting a call back.

## 2016-09-29 NOTE — Progress Notes (Signed)
Nutrition Brief Note  Nutrition consult reviewed for TIA.  65 y.o.femalewith medical history significant of HTN, HLD, and DM presenting with concern for CVA. Patient reports 11/2 AM woke up and felt like entire right side was asleep.Took mother on an errand and then returned and had recurrent numbness/tingling on the right again. She called the doctor and they told her to call 911 and come here. Now with R arm numbness and weakness of R arm and leg.   Wt Readings from Last 15 Encounters:  09/28/16 185 lb (83.9 kg)  09/14/16 187 lb (84.8 kg)  08/30/16 185 lb (83.9 kg)  06/20/16 189 lb (85.7 kg)  05/17/16 188 lb 12.8 oz (85.6 kg)  05/10/16 187 lb 3.2 oz (84.9 kg)  04/30/16 188 lb 3.2 oz (85.4 kg)  04/06/16 185 lb (83.9 kg)  02/20/16 182 lb 12.8 oz (82.9 kg)  02/13/16 185 lb 9.6 oz (84.2 kg)  02/07/16 187 lb (84.8 kg)  12/29/15 184 lb 3.2 oz (83.6 kg)  11/04/15 183 lb 6.4 oz (83.2 kg)  08/17/15 179 lb (81.2 kg)  08/04/15 182 lb (82.6 kg)    Body mass index is 33.84 kg/m. Patient meets criteria for Obesity based on current BMI. Pt eating lunch at time of first visit. Pt states that she has a good appetite and has been eating well recently. RD offered to discuss heart healthy eating which patient was interested in. Pt requested RD return after lunch.  RD returned this afternoon, but pt was being assisted to bathroom at time of visit.   RD provided "Heart Healthy Nutrition Therapy" handout from the [employer]. Left handout with RD contact information at patient bedside. Briefly discussed handout with family at bedside (eat more nuts, fruits, vegetables, healthy fats, and whole grains) and encouraged family/pt to call dietitian with any questions.   Lipid Panel     Component Value Date/Time   CHOL 232 (H) 09/29/2016 0005   CHOL 183 05/03/2016 0807   TRIG 113 09/29/2016 0005   HDL 46 09/29/2016 0005   HDL 52 05/03/2016 0807   CHOLHDL 5.0 09/29/2016 0005   VLDL 23 09/29/2016 0005   LDLCALC 163 (H) 09/29/2016 0005   LDLCALC 109 (H) 05/03/2016 0807    Current diet order is Regular, patient is consuming approximately 75% of meals at this time. Labs and medications reviewed.  No further nutrition interventions warranted at this time. If nutrition issues arise, please re-consult RD.   Scarlette Ar RD, CSP, LDN Inpatient Clinical Dietitian Pager: [phone] After Hours Pager: [phone]
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
## 2016-09-30 DIAGNOSIS — R531 Weakness: Secondary | ICD-10-CM

## 2016-09-30 DIAGNOSIS — R5383 Other fatigue: Secondary | ICD-10-CM

## 2016-09-30 LAB — CBC
HCT: 37.4 % (ref 36.0–46.0)
HEMOGLOBIN: 12.3 g/dL (ref 12.0–15.0)
MCH: 28.9 pg (ref 26.0–34.0)
MCHC: 32.9 g/dL (ref 30.0–36.0)
MCV: 87.8 fL (ref 78.0–100.0)
Platelets: 202 10*3/uL (ref 150–400)
RBC: 4.26 MIL/uL (ref 3.87–5.11)
RDW: 13.5 % (ref 11.5–15.5)
WBC: 5 10*3/uL (ref 4.0–10.5)

## 2016-09-30 LAB — GLUCOSE, CAPILLARY
Glucose-Capillary: 138 mg/dL — ABNORMAL HIGH (ref 65–99)
Glucose-Capillary: 131 mg/dL — ABNORMAL HIGH (ref 65–99)
Glucose-Capillary: 177 mg/dL — ABNORMAL HIGH (ref 65–99)

## 2016-09-30 LAB — BASIC METABOLIC PANEL
ANION GAP: 5 (ref 5–15)
BUN: 16 mg/dL (ref 6–20)
CHLORIDE: 108 mmol/L (ref 101–111)
CO2: 25 mmol/L (ref 22–32)
CREATININE: 0.97 mg/dL (ref 0.44–1.00)
Calcium: 8.9 mg/dL (ref 8.9–10.3)
GFR calc non Af Amer: 60 mL/min — ABNORMAL LOW (ref >60)
Glucose, Bld: 139 mg/dL — ABNORMAL HIGH (ref 65–99)
POTASSIUM: 3.9 mmol/L (ref 3.5–5.1)
SODIUM: 138 mmol/L (ref 135–145)

## 2016-09-30 LAB — HEMOGLOBIN A1C
HEMOGLOBIN A1C: 6.9 % — AB (ref 4.8–5.6)
MEAN PLASMA GLUCOSE: 151 mg/dL

## 2016-09-30 MED ORDER — ASPIRIN 325 MG PO TABS: 325.0000 mg | ORAL_TABLET | Freq: Every day | ORAL | Status: DC

## 2016-09-30 NOTE — Discharge Summary (Addendum)
Physician Discharge Summary  JENNA ROUTZAHN YIR:[id number] DOB: 1950-12-11 DOA: 09/28/2016  PCP: [name], MD  Admit date: 09/28/2016 Discharge date: 09/30/2016  Time spent: 45 minutes  Recommendations for Outpatient Follow-up:  1. NEurology Dr.Sethi in 1 month 2. 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 Patient reports 11/2 AM woke up and felt like entire right side was asleep.Took mother on an errand and then returned and had recurrent numbness/tingling on the right again. She called the doctor and they told her to call 911 and come here. Now with R arm numbness and weakness of R arm and leg.  Hospital Course:  R sided Numbness and Weakness -suspect small infarct not noted on MRI, suspected due to persistent residual symptoms -with residual numbness of RUE and face improved but not completely resolved -MRI normal and MRA without large vessel occlusion -it is possible she had a very small infarct that is not visualized on MRI -Carotid dopplers unremarkable and 2D Echo with normal EF and wall motion -Started on ASA 365m daily -LDL is 163 on maximal dose of atorvastatin and zetia -Neurology consulted -PT/OT/SLP consulted, home health Pt /Ot recommended and set up at discharge  HTN -Allow permissive HTN -resume amlodipine at discharge  HLD -continue Lipitor and Zetia daily  DM -resumed Metformin, Fu Hba1c  AKI -Likely associated with decreased PO intake -resolved with  hydration  Consultations:  Neurology  Discharge Exam: Vitals:   09/30/16 1000 09/30/16 1412  BP: (!) 147/72 127/73  Pulse: 80 77  Resp: 17 15  Temp: 98.6 F (37 C) 98.1 F (36.7 C)    General: AAOx3 Cardiovascular: S1S2/RRR Respiratory: CTAB  Discharge Instructions   Discharge Instructions    Diet - low sodium heart healthy    Complete by:  As directed    Diet Carb Modified    Complete by:  As directed    Increase activity slowly    Complete by:  As directed      Current Discharge Medication List    START taking these medications   Details  aspirin 325 MG tablet Take 1 tablet (325 mg total) by mouth daily.      CONTINUE these medications which have NOT CHANGED   Details  albuterol (PROVENTIL HFA;VENTOLIN HFA) 108 (90 Base) MCG/ACT inhaler Inhale 2 puffs into the lungs every 6 (six) hours as needed for wheezing or shortness of breath. Qty: 1 Inhaler, Refills: 2   Associated Diagnoses: COPD exacerbation (HCC)    ALPRAZolam (XANAX) 1 MG tablet Take 1 tablet (1 mg total) by mouth 3 (three) times daily. TAKE (1) TABLET THREE TIMES DAILY. Qty: 90 tablet, Refills: 5    amLODipine (NORVASC) 5 MG tablet Take 5 mg by mouth daily.    atorvastatin (LIPITOR) 80 MG tablet Take 1 tablet (80 mg total) by mouth daily. Qty: 90 tablet, Refills: 1    ezetimibe (ZETIA) 10 MG tablet Take 1 tablet (10 mg total) by mouth daily. For cholesterol Qty: 90 tablet,  Refills: 3    fluticasone furoate-vilanterol (BREO ELLIPTA) 100-25 MCG/INH AEPB Inhale 1 puff into the lungs daily. Qty: 180 each, Refills: 0   Associated Diagnoses: COPD exacerbation (HCC)    furosemide (LASIX) 20 MG tablet Take 1 tablet (20 mg total) by mouth daily. Qty: 90 tablet, Refills: 1    HYDROcodone-acetaminophen (NORCO) 10-325 MG tablet Take 1 tablet by mouth every 6 (six) hours as needed for moderate pain. Reported on 02/07/2016 Qty: 60 tablet, Refills: 0    metFORMIN (GLUCOPHAGE-XR) 500 MG 24 hr tablet Take  2 tablets (1,000 mg total) by mouth daily with breakfast. Qty: 60 tablet, Refills: 5    nitroGLYCERIN (NITROSTAT) 0.4 MG SL tablet Place 0.4 mg under the tongue every 5 (five) minutes as needed for chest pain.     pantoprazole (PROTONIX) 40 MG tablet Take 1 tablet (40 mg total) by mouth 2 (two) times daily. For stomach Qty: 60 tablet, Refills: 3    solifenacin (VESICARE) 10 MG tablet Take 1 tablet (10 mg total) by mouth daily. Qty: 30 tablet, Refills: 2    albuterol (PROVENTIL) (2.5 MG/3ML) 0.083% nebulizer solution Take 2.5 mg by nebulization every 6 (six) hours as needed for wheezing or shortness of breath. Reported on 02/07/2016    blood glucose meter kit and supplies KIT Dispense based on patient and insurance preference. Use up to four times daily as directed. (FOR ICD-9 250.00, 250.01). Qty: 1 each, Refills: 0   Associated Diagnoses: Type 2 diabetes mellitus without complication, without long-term current use of insulin (HCC)      STOP taking these medications     oxybutynin (OXYTROL) 3.9 MG/24HR        Allergies  Allergen Reactions  . Lyrica [Pregabalin]   . Penicillins Hives    Has patient had a PCN reaction causing immediate rash, facial/tongue/throat swelling, SOB or lightheadedness with hypotension: Yes Has patient had a PCN reaction causing severe rash involving mucus membranes or skin necrosis: No Has patient had a PCN reaction that required hospitalization No Has patient had a PCN reaction occurring within the last 10 years: No If all of the above answers are "NO", then may proceed with Cephalosporin use.   [name] Kitchen Lisinopril Cough   Follow-up Information    SETHI,PRAMOD, MD. Schedule an appointment as soon as possible for a visit in 1 month(s).   Specialties:  Neurology, Radiology Contact information: [address] [place] [address] [facility].   Why:  home health physical and occupational  therapy Contact information: [address] [place] [address] [phone]            The results of significant diagnostics from this hospitalization (including imaging, microbiology, ancillary and laboratory) are listed below for reference.    Significant Diagnostic Studies: Ct Head Wo Contrast  Result Date: 09/28/2016 CLINICAL DATA:  Intermittent right side numbness, tingling since yesterday. EXAM: CT HEAD WITHOUT CONTRAST TECHNIQUE: Contiguous axial images were obtained from the base of the skull through the vertex without intravenous contrast. COMPARISON:  None. FINDINGS: Brain: No acute intracranial abnormality. Specifically, no hemorrhage, hydrocephalus, mass lesion, acute infarction, or significant intracranial injury. Vascular: No hyperdense vessel or unexpected calcification. Skull: Normal. Negative for fracture or focal lesion. Sinuses/Orbits: No acute findings Other: None IMPRESSION: No intracranial abnormality. Electronically Signed   By: Rolm Baptise M.D.   On: 09/28/2016 16:56   Mr Jodene Nam Head Wo Contrast  Result Date:  09/28/2016 CLINICAL DATA:  RIGHT-sided numbness for 2 days. History of hypertension, diabetes, chronic headaches. EXAM: MRI HEAD WITHOUT CONTRAST MRA HEAD WITHOUT CONTRAST TECHNIQUE: Multiplanar, multiecho pulse sequences of the brain and surrounding structures were obtained without intravenous contrast. Angiographic images of the head were obtained using MRA technique without contrast. COMPARISON:  CT HEAD September 28, 2016 at 1648 hours FINDINGS: MRI HEAD FINDINGS BRAIN: No reduced diffusion to suggest acute ischemia. No susceptibility artifact to suggest hemorrhage. The ventricles and sulci are normal for patient's age. No suspicious parenchymal signal, masses or mass effect. No abnormal extra-axial fluid collections. No extra-axial masses though, contrast enhanced sequences would be more sensitive. VASCULAR: Normal major intracranial vascular flow voids  present at skull base. SKULL AND UPPER CERVICAL SPINE: No abnormal sellar expansion. No suspicious calvarial bone marrow signal. Craniocervical junction maintained. SINUSES/ORBITS: Atretic LEFT maxillary sinus consistent with chronic sinusitis. LEFT sphenoid sinus mucosal thickening. Small mastoid effusions. The included ocular globes and orbital contents are non-suspicious. Status post RIGHT ocular lens implant. OTHER: None. MRA HEAD FINDINGS ANTERIOR CIRCULATION: Normal flow related enhancement of the included cervical, petrous, cavernous and supraclinoid internal carotid arteries. Patent anterior communicating artery. Normal flow related enhancement of the anterior and middle cerebral arteries, including distal segments. No large vessel occlusion, high-grade stenosis, abnormal luminal irregularity, aneurysm. POSTERIOR CIRCULATION: LEFT vertebral artery is dominant. Basilar artery is patent, with normal flow related enhancement of the main branch vessels. Normal flow related enhancement of the posterior cerebral arteries. No large vessel occlusion, high-grade stenosis, abnormal luminal irregularity, aneurysm. IMPRESSION: Negative MRI head for age. Negative MRA head. Electronically Signed   By: Elon Alas M.D.   On: 09/28/2016 18:53   Mr Brain Wo Contrast  Result Date: 09/28/2016 CLINICAL DATA:  RIGHT-sided numbness for 2 days. History of hypertension, diabetes, chronic headaches. EXAM: MRI HEAD WITHOUT CONTRAST MRA HEAD WITHOUT CONTRAST TECHNIQUE: Multiplanar, multiecho pulse sequences of the brain and surrounding structures were obtained without intravenous contrast. Angiographic images of the head were obtained using MRA technique without contrast. COMPARISON:  CT HEAD September 28, 2016 at 1648 hours FINDINGS: MRI HEAD FINDINGS BRAIN: No reduced diffusion to suggest acute ischemia. No susceptibility artifact to suggest hemorrhage. The ventricles and sulci are normal for patient's age. No suspicious  parenchymal signal, masses or mass effect. No abnormal extra-axial fluid collections. No extra-axial masses though, contrast enhanced sequences would be more sensitive. VASCULAR: Normal major intracranial vascular flow voids present at skull base. SKULL AND UPPER CERVICAL SPINE: No abnormal sellar expansion. No suspicious calvarial bone marrow signal. Craniocervical junction maintained. SINUSES/ORBITS: Atretic LEFT maxillary sinus consistent with chronic sinusitis. LEFT sphenoid sinus mucosal thickening. Small mastoid effusions. The included ocular globes and orbital contents are non-suspicious. Status post RIGHT ocular lens implant. OTHER: None. MRA HEAD FINDINGS ANTERIOR CIRCULATION: Normal flow related enhancement of the included cervical, petrous, cavernous and supraclinoid internal carotid arteries. Patent anterior communicating artery. Normal flow related enhancement of the anterior and middle cerebral arteries, including distal segments. No large vessel occlusion, high-grade stenosis, abnormal luminal irregularity, aneurysm. POSTERIOR CIRCULATION: LEFT vertebral artery is dominant. Basilar artery is patent, with normal flow related enhancement of the main branch vessels. Normal flow related enhancement of the posterior cerebral arteries. No large vessel occlusion, high-grade stenosis, abnormal luminal irregularity, aneurysm. IMPRESSION: Negative MRI head for age. Negative MRA head. Electronically Signed   By: Elon Alas M.D.   On: 09/28/2016 18:53    Microbiology: No results found for this or any previous visit (from the  past 240 hour(s)).   Labs: Basic Metabolic Panel:  Recent Labs Lab 09/28/16 1645 09/28/16 1653 09/30/16 0653  NA 134* 138 138  K 3.7 3.8 3.9  CL 102 102 108  CO2 26  --  25  GLUCOSE 158* 156* 139*  BUN 23* 23* 16  CREATININE 1.10* 1.10* 0.97  CALCIUM 9.5  --  8.9   Liver Function Tests:  Recent Labs Lab 09/28/16 1645  AST 21  ALT 23  ALKPHOS 116  BILITOT  0.5  PROT 6.8  ALBUMIN 3.9   No results for input(s): LIPASE, AMYLASE in the last 168 hours. No results for input(s): AMMONIA in the last 168 hours. CBC:  Recent Labs Lab 09/28/16 1645 09/28/16 1653 09/30/16 0653  WBC 7.0  --  5.0  NEUTROABS 3.4  --   --   HGB 13.0 12.2 12.3  HCT 38.2 36.0 37.4  MCV 87.0  --  87.8  PLT 214  --  202   Cardiac Enzymes: No results for input(s): CKTOTAL, CKMB, CKMBINDEX, TROPONINI in the last 168 hours. BNP: BNP (last 3 results) No results for input(s): BNP in the last 8760 hours.  ProBNP (last 3 results) No results for input(s): PROBNP in the last 8760 hours.  CBG:  Recent Labs Lab 09/29/16 1201 09/29/16 1639 09/29/16 2128 09/30/16 0638 09/30/16 1151  GLUCAP 166* 169* 183* 138* 131*       SignedDomenic Polite MD.  [facility] 09/30/2016, 3:07 PM

## 2016-09-30 NOTE — Progress Notes (Signed)
Called case manager to notify that patient has an order for Home health PT and OT.

## 2016-09-30 NOTE — Care Management Note (Signed)
Case Management Note  Patient Details  Name: Yvonne [name] MRN: [id number] Date of Birth: 10/31/1951  Subjective/Objective:                  Right arm/leg weakness Action/Plan: Discharge planning Expected Discharge Date:  09/30/16               Expected Discharge Plan:  Home w Home Health Services  In-House Referral:     Discharge planning Services  CM Consult  Post Acute Care Choice:  Home Health Choice offered to:  Patient, Spouse  DME Arranged:  N/A DME Agency:  NA  HH Arranged:  PT, OT HH Agency:  [facility]  Status of Service:  Completed, signed off  If discussed at Long Length of Stay Meetings, dates discussed:    Additional Comments: CM met with pt to offer choice of home health agency.  Pt chooses [facility] to render HHPT/OT.  Referral called to [facility] rep, Jermaine.  Pt states she has a rolling walker and does not need a 3n1.  No other CM needs were communicated. ,  Christine, RN 09/30/2016, 1:52 PM  

## 2016-09-30 NOTE — Progress Notes (Signed)
STROKE TEAM PROGRESS NOTE   HISTORY OF PRESENT ILLNESS (per record) Yvonne [name] is an 65 y.o. female history diabetes mellitus, hypertension and hyperlipidemia who presented to the ED at [facility] with a complaint of right-sided numbness and weakness which was first noticed on the morning of 09/27/2016. She noticed worsening when she woke up on 09/28/2016 and decided to seek medical attention. She describes right facial numbness in addition to extremity numbness and weakness. No facial droop was described. CT scan of her head showed no acute intracranial abnormality. MRI and MRA of the brain was unremarkable. Deficits have markedly improved at this point with only minimal residual sensory symptoms involving right corner of mouth and right hand. NIH stroke score at the time of this evaluation was 0.  LSN: 09/27/2016 tPA Given: No: Beyond time under for treatment consideration; resolving deficits mRankin:  SUBJECTIVE (INTERVAL HISTORY) Her husband was at the bedside.  Overall she feels her condition impoved. She is extremely lethargic and has reportedly slept all day.  I observed her having difficulty focusing on eating her dinner entree.  RN at bedside states she has been this way most of the day.  Patient states that she only takes Xanax as needed and not as a standing dose.  She states she takes a 1/2 pill.  Not clear the home dose.  I spoke with the primary team and expressed concerns regarding the sleepiness as it seems to relate to the Xanax timing   OBJECTIVE Temp:  [97.7 F (36.5 C)-98.6 F (37 C)] 98.1 F (36.7 C) (11/05 1412) Pulse Rate:  [64-82] 77 (11/05 1412) Cardiac Rhythm: Normal sinus rhythm (11/05 0802) Resp:  [15-116] 15 (11/05 1412) BP: (127-165)/(62-78) 127/73 (11/05 1412) SpO2:  [92 %-97 %] 92 % (11/05 1412)  CBC:   Recent Labs Lab 09/28/16 1645 09/28/16 1653 09/30/16 0653  WBC 7.0  --  5.0  NEUTROABS 3.4  --   --   HGB 13.0 12.2 12.3  HCT 38.2 36.0 37.4  MCV 87.0   --  87.8  PLT 214  --  [id number]    Basic Metabolic Panel:   Recent Labs Lab 09/28/16 1645 09/28/16 1653 09/30/16 0653  NA 134* 138 138  K 3.7 3.8 3.9  CL 102 102 108  CO2 26  --  25  GLUCOSE 158* 156* 139*  BUN 23* 23* 16  CREATININE 1.10* 1.10* 0.97  CALCIUM 9.5  --  8.9    Lipid Panel:     Component Value Date/Time   CHOL 232 (H) 09/29/2016 0005   CHOL 183 05/03/2016 0807   TRIG 113 09/29/2016 0005   HDL 46 09/29/2016 0005   HDL 52 05/03/2016 0807   CHOLHDL 5.0 09/29/2016 0005   VLDL 23 09/29/2016 0005   LDLCALC 163 (H) 09/29/2016 0005   LDLCALC 109 (H) 05/03/2016 0807   HgbA1c:  Lab Results  Component Value Date   HGBA1C 6.9 (H) 09/29/2016   Urine Drug Screen:     Component Value Date/Time   LABOPIA NONE DETECTED 09/28/2016 2019   COCAINSCRNUR NONE DETECTED 09/28/2016 2019   LABBENZ NONE DETECTED 09/28/2016 2019   AMPHETMU NONE DETECTED 09/28/2016 2019   THCU NONE DETECTED 09/28/2016 2019   LABBARB NONE DETECTED 09/28/2016 2019      IMAGING  Ct Head Wo Contrast 09/28/2016 No intracranial abnormality.   Mr Virgel Paling Wo Contrast 09/28/2016 Negative MRI head for age.  Negative MRA head.     PHYSICAL EXAM HEENT-  Normocephalic, no  lesions, without obvious abnormality.  Normal external eye and conjunctiva.  Normal TM's bilaterally.  Normal auditory canals and external ears. Normal external nose, mucus membranes and septum.  Normal pharynx. Neck supple with no masses, nodes, nodules or enlargement. Cardiovascular - regular rate and rhythm, S1, S2 normal, no murmur, click, rub or gallop Lungs - chest clear, no wheezing, rales, normal symmetric air entry Abdomen - soft, non-tender; bowel sounds normal; no masses,  no organomegaly Extremities - no joint deformities, effusion, or inflammation and no edema  Neurologic Examination: Mental Status: Lethargic but oriented, no acute distress, depressed affect.  Speech fluent without evidence of aphasia. Able to  follow some commands  Cranial Nerves: Grossly positive for slight facial weakness on right Motor: 5/5 bilaterally with normal tone and bulk; she does seem to drift in the right arm when trying to eat.  ?Sedation from anti-anxiolytic?   ASSESSMENT/PLAN Yvonne [name] is a 65 y.o. female with history of hypertension, hyperlipidemia, diabetes mellitus, chronic headaches, asthma, and anxiety  presenting with right-sided weakness and numbness.  She did not receive IV t-PA due to late presentation.  TIA versus atypical migraine:  Dominant   Resultant right sided numbness  MRI - negative  MRA - negative  Carotid Doppler - 1-39% internal carotid artery stenosis bilaterally. Vertebral arteries are patent with antegrade flow.  2D Echo - EF 65-70%. No cardiac source for emboli identified.  LDL - 163  HgbA1c - 6.9  VTE prophylaxis - Lovenox Diet Carb Modified Fluid consistency: Thin; Room service appropriate? Yes Diet - low sodium heart healthy Diet Carb Modified  No antithrombotic prior to admission, now on aspirin 325 mg daily  Patient counseled to be compliant with her antithrombotic medications  Ongoing aggressive stroke risk factor management  Therapy recommendations: Inpatient rehabilitation recommended  Disposition: Pending  Hypertension  Stable  Permissive hypertension (OK if < 220/120) but gradually normalize in 5-7 days  Long-term BP goal normotensive  Hyperlipidemia  Home meds:  Lipitor 80 mg daily and Zetia 10 mg daily resumed in hospital  LDL 163, goal < 70  Verify compliance  Continue statin at discharge  Diabetes  HgbA1c 6.9, goal < 7.0  Uncontrolled  Other Stroke Risk Factors  Advanced age  Obesity, Body mass index is 33.84 kg/m., recommend weight loss, diet and exercise as appropriate   Family hx stroke (mother)   Other Active Problems  BUN 23 / creatinine 1.10 -> 16 / 0.97 (now normal)   Hospital day # 0  ATTENDING NOTE: Patient  was seen and examined by me personally. Documentation reflects findings. The laboratory and radiographic studies reviewed by me. ROS completed by me personally and pertinent positives fully documented  Condition: stable  Assessment and plan completed by me personally and fully documented above. Plans/Recommendations include:     Exam sill demonstrates sleepiness that has been observed all day  Discussed the lethargy with primary team.  They plan to re-evaluate her prior to discharge   When deemed medically stable for discharge, patient can be followed by O/P Neurology     SIGNED BY: Dr. Elissa Hefty    To contact Stroke Continuity provider, please refer to [email or web address] After hours, contact General Neurology

## 2016-09-30 NOTE — Progress Notes (Signed)
Patient is being d/c home. D/c instructions given to patient and caregiver, verbalized understanding.

## 2016-10-01 ENCOUNTER — Telehealth: Payer: Self-pay | Admitting: Family Medicine

## 2016-10-01 ENCOUNTER — Ambulatory Visit (INDEPENDENT_AMBULATORY_CARE_PROVIDER_SITE_OTHER): Payer: Commercial Managed Care - HMO | Admitting: Family Medicine

## 2016-10-01 ENCOUNTER — Encounter: Payer: Self-pay | Admitting: Family Medicine

## 2016-10-01 ENCOUNTER — Telehealth: Payer: Self-pay

## 2016-10-01 VITALS — BP 155/79 | HR 73 | Temp 97.4°F | Ht 62.0 in | Wt 188.0 lb

## 2016-10-01 DIAGNOSIS — IMO0002 Reserved for concepts with insufficient information to code with codable children: Secondary | ICD-10-CM

## 2016-10-01 DIAGNOSIS — I69351 Hemiplegia and hemiparesis following cerebral infarction affecting right dominant side: Secondary | ICD-10-CM

## 2016-10-01 DIAGNOSIS — I63312 Cerebral infarction due to thrombosis of left middle cerebral artery: Secondary | ICD-10-CM

## 2016-10-01 DIAGNOSIS — I69359 Hemiplegia and hemiparesis following cerebral infarction affecting unspecified side: Secondary | ICD-10-CM

## 2016-10-01 DIAGNOSIS — I69398 Other sequelae of cerebral infarction: Secondary | ICD-10-CM | POA: Diagnosis not present

## 2016-10-01 MED ORDER — AMLODIPINE BESYLATE 10 MG PO TABS: 5.0000 mg | ORAL_TABLET | Freq: Every day | ORAL | 2 refills | Status: DC

## 2016-10-01 NOTE — Telephone Encounter (Signed)
Please arrange due to CVA

## 2016-10-01 NOTE — Telephone Encounter (Signed)
Order was placed for in home physical therapy, Faylene Million notified.

## 2016-10-01 NOTE — ED Notes (Signed)
Arrival time documented in error, actual arrival time 1630. Disregard arrival time of 1642 documented in stroke log.

## 2016-10-01 NOTE — Telephone Encounter (Signed)
Pt needs ramp due to recent stroke Please advise

## 2016-10-01 NOTE — Patient Instructions (Signed)
Amlodipine take 2 tablets a day then pickup new prescription.

## 2016-10-01 NOTE — Telephone Encounter (Signed)
Script was done for ramp for patient's home, given to Dr. [name] by [name] and to be placed at front desk by her.  Patient is to come tomorrow to pick up.  I also had to tell patient's son, Yvonne [name], that he was not on her HIPPA form and I could not talk to him.  He became very aggressive and was cussing in the background while I spoke with Yvonne [name].  She said she understood and would sign a new hippa form tomorrow when she picks up the prescription.

## 2016-10-01 NOTE — Telephone Encounter (Signed)
Please  write and I will sign. Thanks, WS 

## 2016-10-01 NOTE — Progress Notes (Signed)
Subjective:  Patient ID: Yvonne [name], female    DOB: 1950-12-18  Age: 65 y.o. MRN: [id number]  CC: Hospitalization Follow-up ([facility] (friday))   HPI Yvonne [name] presents for Patient presented to [facility] several days ago with a stroke. She awoke on the morning of November 2 with numbness and tingling but no paralysis of her right side. It seemed to get better and she took her mother to the doctor. The next morning she awoke and the symptoms were worse. She had right-sided weakness. Therefore she went to [facility]. She continues to have left upper lower extremity weakness that is incomplete but interfering with ambulation and use of the hand for fine movement. She has minimal use of the right elbow joint. MRI done during hospitalization was negative. No stroke noted. MRA was free of large vessel occlusion. However the patient's symptoms are highly suggestive. Therefore stroke of her dominant side was diagnosed. Symptoms continue as a presentation today.   History Yvonne [name] has a past medical history of Anxiety; Asthma; Cataract; Chronic bronchitis ([place]); Chronic headaches; Chronic leg pain; Clavicle fracture; Diabetes mellitus without complication (HCC); H/O seasonal allergies; Hyperlipidemia; Hypertension; Overactive bladder; and Stroke ([place]).   She has a past surgical history that includes Abdominal hysterectomy; Cholecystectomy; tear duct surgery (Bilateral); Colonoscopy; Colonoscopy (N/A, 08/17/2015); and Cataract extraction.   Her family history includes Arthritis in her brother and mother; Asthma in her father; CVA in her mother; Cancer in her brother; Diabetes in her father and mother; Heart disease in her father; Hepatitis C in her sister.She reports that she has never smoked. She has never used smokeless tobacco. She reports that she does not drink alcohol or use drugs. righ   ROS Review of Systems  Constitutional: Negative for activity change,  appetite change and fever.  HENT: Negative for congestion, rhinorrhea and sore throat.   Eyes: Negative for visual disturbance.  Respiratory: Negative for cough and shortness of breath.   Cardiovascular: Negative for chest pain and palpitations.  Gastrointestinal: Negative for abdominal pain, diarrhea and nausea.  Genitourinary: Negative for dysuria.  Musculoskeletal: Positive for gait problem. Negative for arthralgias and myalgias.  Neurological: Positive for facial asymmetry and weakness. Negative for dizziness.  Psychiatric/Behavioral: Negative for confusion.    Objective:  BP (!) 155/79 (BP Location: Left Arm, Cuff Size: Large)   Pulse 73   Temp 97.4 F (36.3 C) (Oral)   Ht _0  (1.575 m)   Wt 188 lb (85.3 kg)   BMI 34.39 kg/m   BP Readings from Last 3 Encounters:  10/08/16 129/80  10/01/16 (!) 155/79  09/30/16 127/73    Wt Readings from Last 3 Encounters:  10/08/16 186 lb (84.4 kg)  10/01/16 188 lb (85.3 kg)  09/28/16 185 lb (83.9 kg)     Physical Exam  Constitutional: She is oriented to person, place, and time. She appears well-developed and well-nourished. No distress.  HENT:  Head: Normocephalic and atraumatic.  Right Ear: External ear normal.  Left Ear: External ear normal.  Nose: Nose normal.  Mouth/Throat: Oropharynx is clear and moist.  Eyes: Conjunctivae and EOM are normal. Pupils are equal, round, and reactive to light.  Neck: Normal range of motion. Neck supple. No thyromegaly present.  Cardiovascular: Normal rate, regular rhythm and normal heart sounds.   No murmur heard. Pulmonary/Chest: Effort normal and breath sounds normal. No respiratory distress. She has no wheezes. She has no rales.  Abdominal: Soft. Bowel sounds are normal.  She exhibits no distension. There is no tenderness.  Lymphadenopathy:    She has no cervical adenopathy.  Neurological: She is alert and oriented to person, place, and time. She exhibits abnormal muscle tone. Coordination  abnormal.  Patient demonstrates significant right upper and lower extremity weakness. There is a positive Babinski. She is unable to ambulate effectively. Grip strength is 2/5. Elbow flexion is 2-3/5. Right shoulder is 3/5. Right lower extremity is 2/5 all major joints.  Skin: Skin is warm and dry.  Psychiatric: She has a normal mood and affect. Her behavior is normal. Judgment and thought content normal.     Lab Results  Component Value Date   WBC 5.0 09/30/2016   HGB 12.3 09/30/2016   HCT 37.4 09/30/2016   PLT 202 09/30/2016   GLUCOSE 139 (H) 09/30/2016   CHOL 232 (H) 09/29/2016   TRIG 113 09/29/2016   HDL 46 09/29/2016   LDLCALC 163 (H) 09/29/2016   ALT 23 09/28/2016   AST 21 09/28/2016   NA 138 09/30/2016   K 3.9 09/30/2016   CL 108 09/30/2016   CREATININE 0.97 09/30/2016   BUN 16 09/30/2016   CO2 25 09/30/2016   TSH 1.580 06/03/2015   INR 0.86 09/28/2016   HGBA1C 6.9 (H) 09/29/2016    Ct Head Wo Contrast  Result Date: 09/28/2016 CLINICAL DATA:  Intermittent right side numbness, tingling since yesterday. EXAM: CT HEAD WITHOUT CONTRAST TECHNIQUE: Contiguous axial images were obtained from the base of the skull through the vertex without intravenous contrast. COMPARISON:  None. FINDINGS: Brain: No acute intracranial abnormality. Specifically, no hemorrhage, hydrocephalus, mass lesion, acute infarction, or significant intracranial injury. Vascular: No hyperdense vessel or unexpected calcification. Skull: Normal. Negative for fracture or focal lesion. Sinuses/Orbits: No acute findings Other: None IMPRESSION: No intracranial abnormality. Electronically Signed   By: Rolm Baptise M.D.   On: 09/28/2016 16:56   Mr Jodene Nam Head Wo Contrast  Result Date: 09/28/2016 CLINICAL DATA:  RIGHT-sided numbness for 2 days. History of hypertension, diabetes, chronic headaches. EXAM: MRI HEAD WITHOUT CONTRAST MRA HEAD WITHOUT CONTRAST TECHNIQUE: Multiplanar, multiecho pulse sequences of the brain and  surrounding structures were obtained without intravenous contrast. Angiographic images of the head were obtained using MRA technique without contrast. COMPARISON:  CT HEAD September 28, 2016 at 1648 hours FINDINGS: MRI HEAD FINDINGS BRAIN: No reduced diffusion to suggest acute ischemia. No susceptibility artifact to suggest hemorrhage. The ventricles and sulci are normal for patient's age. No suspicious parenchymal signal, masses or mass effect. No abnormal extra-axial fluid collections. No extra-axial masses though, contrast enhanced sequences would be more sensitive. VASCULAR: Normal major intracranial vascular flow voids present at skull base. SKULL AND UPPER CERVICAL SPINE: No abnormal sellar expansion. No suspicious calvarial bone marrow signal. Craniocervical junction maintained. SINUSES/ORBITS: Atretic LEFT maxillary sinus consistent with chronic sinusitis. LEFT sphenoid sinus mucosal thickening. Small mastoid effusions. The included ocular globes and orbital contents are non-suspicious. Status post RIGHT ocular lens implant. OTHER: None. MRA HEAD FINDINGS ANTERIOR CIRCULATION: Normal flow related enhancement of the included cervical, petrous, cavernous and supraclinoid internal carotid arteries. Patent anterior communicating artery. Normal flow related enhancement of the anterior and middle cerebral arteries, including distal segments. No large vessel occlusion, high-grade stenosis, abnormal luminal irregularity, aneurysm. POSTERIOR CIRCULATION: LEFT vertebral artery is dominant. Basilar artery is patent, with normal flow related enhancement of the main branch vessels. Normal flow related enhancement of the posterior cerebral arteries. No large vessel occlusion, high-grade stenosis, abnormal luminal irregularity, aneurysm. IMPRESSION: Negative MRI  head for age. Negative MRA head. Electronically Signed   By: Elon Alas M.D.   On: 09/28/2016 18:53   Mr Brain Wo Contrast  Result Date:  09/28/2016 CLINICAL DATA:  RIGHT-sided numbness for 2 days. History of hypertension, diabetes, chronic headaches. EXAM: MRI HEAD WITHOUT CONTRAST MRA HEAD WITHOUT CONTRAST TECHNIQUE: Multiplanar, multiecho pulse sequences of the brain and surrounding structures were obtained without intravenous contrast. Angiographic images of the head were obtained using MRA technique without contrast. COMPARISON:  CT HEAD September 28, 2016 at 1648 hours FINDINGS: MRI HEAD FINDINGS BRAIN: No reduced diffusion to suggest acute ischemia. No susceptibility artifact to suggest hemorrhage. The ventricles and sulci are normal for patient's age. No suspicious parenchymal signal, masses or mass effect. No abnormal extra-axial fluid collections. No extra-axial masses though, contrast enhanced sequences would be more sensitive. VASCULAR: Normal major intracranial vascular flow voids present at skull base. SKULL AND UPPER CERVICAL SPINE: No abnormal sellar expansion. No suspicious calvarial bone marrow signal. Craniocervical junction maintained. SINUSES/ORBITS: Atretic LEFT maxillary sinus consistent with chronic sinusitis. LEFT sphenoid sinus mucosal thickening. Small mastoid effusions. The included ocular globes and orbital contents are non-suspicious. Status post RIGHT ocular lens implant. OTHER: None. MRA HEAD FINDINGS ANTERIOR CIRCULATION: Normal flow related enhancement of the included cervical, petrous, cavernous and supraclinoid internal carotid arteries. Patent anterior communicating artery. Normal flow related enhancement of the anterior and middle cerebral arteries, including distal segments. No large vessel occlusion, high-grade stenosis, abnormal luminal irregularity, aneurysm. POSTERIOR CIRCULATION: LEFT vertebral artery is dominant. Basilar artery is patent, with normal flow related enhancement of the main branch vessels. Normal flow related enhancement of the posterior cerebral arteries. No large vessel occlusion, high-grade  stenosis, abnormal luminal irregularity, aneurysm. IMPRESSION: Negative MRI head for age. Negative MRA head. Electronically Signed   By: Elon Alas M.D.   On: 09/28/2016 18:53    Assessment & Plan:   Sumayah was seen today for hospitalization follow-up.  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    I have discontinued Yvonne [name] amLODipine. I am also having her maintain her albuterol, blood glucose meter kit and supplies, HYDROcodone-acetaminophen, ezetimibe, albuterol, solifenacin, fluticasone furoate-vilanterol, ALPRAZolam, metFORMIN, pantoprazole, nitroGLYCERIN, and aspirin.  Meds ordered this encounter  Medications  . DISCONTD: amLODipine (NORVASC) 10 MG tablet    Sig: Take 0.5 tablets (5 mg total) by mouth daily.    Dispense:  30 tablet    Refill:  2     Follow-up: Return in about 7 days (around 10/08/2016) for CVA.  [name], M.D.

## 2016-10-01 NOTE — Telephone Encounter (Signed)
Patient needs a referral for physical therapy to come into her home.  She said after her appointment today she talked to physical therapy and they told her the hospital had not ordered this, it would need to be ordered by her PCP.  Please advise.

## 2016-10-02 DIAGNOSIS — E785 Hyperlipidemia, unspecified: Secondary | ICD-10-CM | POA: Diagnosis not present

## 2016-10-02 DIAGNOSIS — J42 Unspecified chronic bronchitis: Secondary | ICD-10-CM | POA: Diagnosis not present

## 2016-10-02 DIAGNOSIS — J45909 Unspecified asthma, uncomplicated: Secondary | ICD-10-CM | POA: Diagnosis not present

## 2016-10-02 DIAGNOSIS — I69351 Hemiplegia and hemiparesis following cerebral infarction affecting right dominant side: Secondary | ICD-10-CM | POA: Diagnosis not present

## 2016-10-02 DIAGNOSIS — I10 Essential (primary) hypertension: Secondary | ICD-10-CM | POA: Diagnosis not present

## 2016-10-02 DIAGNOSIS — Z7984 Long term (current) use of oral hypoglycemic drugs: Secondary | ICD-10-CM | POA: Diagnosis not present

## 2016-10-02 DIAGNOSIS — Z8781 Personal history of (healed) traumatic fracture: Secondary | ICD-10-CM | POA: Diagnosis not present

## 2016-10-02 DIAGNOSIS — E119 Type 2 diabetes mellitus without complications: Secondary | ICD-10-CM | POA: Diagnosis not present

## 2016-10-02 LAB — VAS US CAROTID
LEFT ECA DIAS: 13 cm/s
LEFT VERTEBRAL DIAS: 15 cm/s
LICADDIAS: -31 cm/s
LICAPDIAS: -15 cm/s
LICAPSYS: -52 cm/s
Left CCA dist dias: -19 cm/s
Left CCA dist sys: -76 cm/s
Left CCA prox dias: 27 cm/s
Left CCA prox sys: 180 cm/s
Left ICA dist sys: -87 cm/s
RCCAPDIAS: 11 cm/s
RIGHT ECA DIAS: -10 cm/s
RIGHT VERTEBRAL DIAS: 11 cm/s
Right CCA prox sys: 67 cm/s
Right cca dist sys: -137 cm/s

## 2016-10-03 DIAGNOSIS — Z7984 Long term (current) use of oral hypoglycemic drugs: Secondary | ICD-10-CM | POA: Diagnosis not present

## 2016-10-03 DIAGNOSIS — E785 Hyperlipidemia, unspecified: Secondary | ICD-10-CM | POA: Diagnosis not present

## 2016-10-03 DIAGNOSIS — I10 Essential (primary) hypertension: Secondary | ICD-10-CM | POA: Diagnosis not present

## 2016-10-03 DIAGNOSIS — I69351 Hemiplegia and hemiparesis following cerebral infarction affecting right dominant side: Secondary | ICD-10-CM | POA: Diagnosis not present

## 2016-10-03 DIAGNOSIS — J45909 Unspecified asthma, uncomplicated: Secondary | ICD-10-CM | POA: Diagnosis not present

## 2016-10-03 DIAGNOSIS — J42 Unspecified chronic bronchitis: Secondary | ICD-10-CM | POA: Diagnosis not present

## 2016-10-03 DIAGNOSIS — Z8781 Personal history of (healed) traumatic fracture: Secondary | ICD-10-CM | POA: Diagnosis not present

## 2016-10-03 DIAGNOSIS — E119 Type 2 diabetes mellitus without complications: Secondary | ICD-10-CM | POA: Diagnosis not present

## 2016-10-04 DIAGNOSIS — I10 Essential (primary) hypertension: Secondary | ICD-10-CM | POA: Diagnosis not present

## 2016-10-04 DIAGNOSIS — E119 Type 2 diabetes mellitus without complications: Secondary | ICD-10-CM | POA: Diagnosis not present

## 2016-10-04 DIAGNOSIS — I69351 Hemiplegia and hemiparesis following cerebral infarction affecting right dominant side: Secondary | ICD-10-CM | POA: Diagnosis not present

## 2016-10-04 DIAGNOSIS — J42 Unspecified chronic bronchitis: Secondary | ICD-10-CM | POA: Diagnosis not present

## 2016-10-04 DIAGNOSIS — Z8781 Personal history of (healed) traumatic fracture: Secondary | ICD-10-CM | POA: Diagnosis not present

## 2016-10-04 DIAGNOSIS — E785 Hyperlipidemia, unspecified: Secondary | ICD-10-CM | POA: Diagnosis not present

## 2016-10-04 DIAGNOSIS — Z7984 Long term (current) use of oral hypoglycemic drugs: Secondary | ICD-10-CM | POA: Diagnosis not present

## 2016-10-04 DIAGNOSIS — J45909 Unspecified asthma, uncomplicated: Secondary | ICD-10-CM | POA: Diagnosis not present

## 2016-10-05 DIAGNOSIS — J42 Unspecified chronic bronchitis: Secondary | ICD-10-CM | POA: Diagnosis not present

## 2016-10-05 DIAGNOSIS — J45909 Unspecified asthma, uncomplicated: Secondary | ICD-10-CM | POA: Diagnosis not present

## 2016-10-05 DIAGNOSIS — E119 Type 2 diabetes mellitus without complications: Secondary | ICD-10-CM | POA: Diagnosis not present

## 2016-10-05 DIAGNOSIS — I10 Essential (primary) hypertension: Secondary | ICD-10-CM | POA: Diagnosis not present

## 2016-10-05 DIAGNOSIS — E785 Hyperlipidemia, unspecified: Secondary | ICD-10-CM | POA: Diagnosis not present

## 2016-10-05 DIAGNOSIS — I69351 Hemiplegia and hemiparesis following cerebral infarction affecting right dominant side: Secondary | ICD-10-CM | POA: Diagnosis not present

## 2016-10-05 DIAGNOSIS — Z8781 Personal history of (healed) traumatic fracture: Secondary | ICD-10-CM | POA: Diagnosis not present

## 2016-10-05 DIAGNOSIS — Z7984 Long term (current) use of oral hypoglycemic drugs: Secondary | ICD-10-CM | POA: Diagnosis not present

## 2016-10-08 ENCOUNTER — Ambulatory Visit (INDEPENDENT_AMBULATORY_CARE_PROVIDER_SITE_OTHER): Payer: Commercial Managed Care - HMO | Admitting: Nurse Practitioner

## 2016-10-08 ENCOUNTER — Encounter: Payer: Self-pay | Admitting: Nurse Practitioner

## 2016-10-08 ENCOUNTER — Ambulatory Visit: Payer: Commercial Managed Care - HMO | Admitting: Family Medicine

## 2016-10-08 VITALS — BP 129/80 | HR 84 | Temp 98.1°F | Ht 62.0 in | Wt 186.0 lb

## 2016-10-08 DIAGNOSIS — E785 Hyperlipidemia, unspecified: Secondary | ICD-10-CM | POA: Diagnosis not present

## 2016-10-08 DIAGNOSIS — I10 Essential (primary) hypertension: Secondary | ICD-10-CM | POA: Diagnosis not present

## 2016-10-08 DIAGNOSIS — I6930 Unspecified sequelae of cerebral infarction: Secondary | ICD-10-CM | POA: Diagnosis not present

## 2016-10-08 DIAGNOSIS — I69351 Hemiplegia and hemiparesis following cerebral infarction affecting right dominant side: Secondary | ICD-10-CM | POA: Diagnosis not present

## 2016-10-08 DIAGNOSIS — Z8781 Personal history of (healed) traumatic fracture: Secondary | ICD-10-CM | POA: Diagnosis not present

## 2016-10-08 DIAGNOSIS — J45909 Unspecified asthma, uncomplicated: Secondary | ICD-10-CM | POA: Diagnosis not present

## 2016-10-08 DIAGNOSIS — E119 Type 2 diabetes mellitus without complications: Secondary | ICD-10-CM | POA: Diagnosis not present

## 2016-10-08 DIAGNOSIS — Z7984 Long term (current) use of oral hypoglycemic drugs: Secondary | ICD-10-CM | POA: Diagnosis not present

## 2016-10-08 DIAGNOSIS — J42 Unspecified chronic bronchitis: Secondary | ICD-10-CM | POA: Diagnosis not present

## 2016-10-08 NOTE — Progress Notes (Signed)
   Subjective:    Patient ID: Yvonne [name], female    DOB: 23-Dec-1950, 65 y.o.   MRN: [id number]  HPI Patient is here today for follow up- Patient was in hospital on NOvember 3rd and was discharged on November 5th- was dx with stroke on left side with residual weakness in right arm and leg- she still has numbness in right hand- she is getting in home physical therapy 2x a week. She is some better but the weakness on right side still has not resolved.    Review of Systems  Constitutional: Negative.   HENT: Negative.   Respiratory: Negative.   Cardiovascular: Negative.   Genitourinary: Negative.   Neurological: Negative.   Psychiatric/Behavioral: Negative.   All other systems reviewed and are negative.      Objective:   Physical Exam  Constitutional: She is oriented to person, place, and time. She appears well-developed and well-nourished. No distress.  Cardiovascular: Normal rate, regular rhythm and normal heart sounds.   Pulmonary/Chest: Effort normal and breath sounds normal.  Musculoskeletal:  Right grip weaker then left - sensation intact Motor strength and sensation of bil lower ext intact  Neurological: She is alert and oriented to person, place, and time. She has normal reflexes. No cranial nerve deficit.  Skin: Skin is warm.  Psychiatric: She has a normal mood and affect. Her behavior is normal. Judgment and thought content normal.   BP 129/80   Pulse 84   Temp 98.1 F (36.7 C) (Oral)   Ht 5\' 2"  (1.575 m)   Wt 186 lb (84.4 kg)   BMI 34.02 kg/m        Assessment & Plan:   1. Late effect of cerebrovascular accident (CVA)   hospital record reviewed Continue PT as directed Rest daily Decrease stress RTO prn  Yvonne Hassell Done, FNP

## 2016-10-08 NOTE — Patient Instructions (Signed)
Stroke Prevention Some medical conditions and behaviors are associated with an increased chance of having a stroke. You may prevent a stroke by making healthy choices and managing medical conditions. HOW CAN I REDUCE MY RISK OF HAVING A STROKE?   Stay physically active. Get at least 30 minutes of activity on most or all days.  Do not smoke. It may also be helpful to avoid exposure to secondhand smoke.  Limit alcohol use. Moderate alcohol use is considered to be:  No more than 2 drinks per day for men.  No more than 1 drink per day for nonpregnant women.  Eat healthy foods. This involves:  Eating 5 or more servings of fruits and vegetables a day.  Making dietary changes that address high blood pressure (hypertension), high cholesterol, diabetes, or obesity.  Manage your cholesterol levels.  Making food choices that are high in fiber and low in saturated fat, trans fat, and cholesterol may control cholesterol levels.  Take any prescribed medicines to control cholesterol as directed by your health care provider.  Manage your diabetes.  Controlling your carbohydrate and sugar intake is recommended to manage diabetes.  Take any prescribed medicines to control diabetes as directed by your health care provider.  Control your hypertension.  Making food choices that are low in salt (sodium), saturated fat, trans fat, and cholesterol is recommended to manage hypertension.  Ask your health care provider if you need treatment to lower your blood pressure. Take any prescribed medicines to control hypertension as directed by your health care provider.  If you are 18-39 years of age, have your blood pressure checked every 3-5 years. If you are 40 years of age or older, have your blood pressure checked every year.  Maintain a healthy weight.  Reducing calorie intake and making food choices that are low in sodium, saturated fat, trans fat, and cholesterol are recommended to manage  weight.  Stop drug abuse.  Avoid taking birth control pills.  Talk to your health care provider about the risks of taking birth control pills if you are over 35 years old, smoke, get migraines, or have ever had a blood clot.  Get evaluated for sleep disorders (sleep apnea).  Talk to your health care provider about getting a sleep evaluation if you snore a lot or have excessive sleepiness.  Take medicines only as directed by your health care provider.  For some people, aspirin or blood thinners (anticoagulants) are helpful in reducing the risk of forming abnormal blood clots that can lead to stroke. If you have the irregular heart rhythm of atrial fibrillation, you should be on a blood thinner unless there is a good reason you cannot take them.  Understand all your medicine instructions.  Make sure that other conditions (such as anemia or atherosclerosis) are addressed. SEEK IMMEDIATE MEDICAL CARE IF:   You have sudden weakness or numbness of the face, arm, or leg, especially on one side of the body.  Your face or eyelid droops to one side.  You have sudden confusion.  You have trouble speaking (aphasia) or understanding.  You have sudden trouble seeing in one or both eyes.  You have sudden trouble walking.  You have dizziness.  You have a loss of balance or coordination.  You have a sudden, severe headache with no known cause.  You have new chest pain or an irregular heartbeat. Any of these symptoms may represent a serious problem that is an emergency. Do not wait to see if the symptoms will   go away. Get medical help at once. Call your local emergency services (911 in [place] Do not drive yourself to the hospital.   This information is not intended to replace advice given to you by your health care provider. Make sure you discuss any questions you have with your health care provider.   Document Released: 12/20/2004 Document Revised: 12/03/2014 Document Reviewed:  05/15/2013 Elsevier Interactive Patient Education 2016 Elsevier Inc.  

## 2016-10-09 ENCOUNTER — Other Ambulatory Visit: Payer: Self-pay | Admitting: Family Medicine

## 2016-10-09 DIAGNOSIS — I69351 Hemiplegia and hemiparesis following cerebral infarction affecting right dominant side: Secondary | ICD-10-CM | POA: Diagnosis not present

## 2016-10-09 DIAGNOSIS — Z8781 Personal history of (healed) traumatic fracture: Secondary | ICD-10-CM | POA: Diagnosis not present

## 2016-10-09 DIAGNOSIS — J42 Unspecified chronic bronchitis: Secondary | ICD-10-CM | POA: Diagnosis not present

## 2016-10-09 DIAGNOSIS — Z7984 Long term (current) use of oral hypoglycemic drugs: Secondary | ICD-10-CM | POA: Diagnosis not present

## 2016-10-09 DIAGNOSIS — E119 Type 2 diabetes mellitus without complications: Secondary | ICD-10-CM | POA: Diagnosis not present

## 2016-10-09 DIAGNOSIS — J45909 Unspecified asthma, uncomplicated: Secondary | ICD-10-CM | POA: Diagnosis not present

## 2016-10-09 DIAGNOSIS — E785 Hyperlipidemia, unspecified: Secondary | ICD-10-CM | POA: Diagnosis not present

## 2016-10-09 DIAGNOSIS — I10 Essential (primary) hypertension: Secondary | ICD-10-CM | POA: Diagnosis not present

## 2016-10-10 DIAGNOSIS — E119 Type 2 diabetes mellitus without complications: Secondary | ICD-10-CM | POA: Diagnosis not present

## 2016-10-10 DIAGNOSIS — I69351 Hemiplegia and hemiparesis following cerebral infarction affecting right dominant side: Secondary | ICD-10-CM | POA: Diagnosis not present

## 2016-10-10 DIAGNOSIS — J42 Unspecified chronic bronchitis: Secondary | ICD-10-CM | POA: Diagnosis not present

## 2016-10-10 DIAGNOSIS — Z8781 Personal history of (healed) traumatic fracture: Secondary | ICD-10-CM | POA: Diagnosis not present

## 2016-10-10 DIAGNOSIS — J45909 Unspecified asthma, uncomplicated: Secondary | ICD-10-CM | POA: Diagnosis not present

## 2016-10-10 DIAGNOSIS — Z7984 Long term (current) use of oral hypoglycemic drugs: Secondary | ICD-10-CM | POA: Diagnosis not present

## 2016-10-10 DIAGNOSIS — I10 Essential (primary) hypertension: Secondary | ICD-10-CM | POA: Diagnosis not present

## 2016-10-10 DIAGNOSIS — E785 Hyperlipidemia, unspecified: Secondary | ICD-10-CM | POA: Diagnosis not present

## 2016-10-11 DIAGNOSIS — I10 Essential (primary) hypertension: Secondary | ICD-10-CM | POA: Diagnosis not present

## 2016-10-11 DIAGNOSIS — E785 Hyperlipidemia, unspecified: Secondary | ICD-10-CM | POA: Diagnosis not present

## 2016-10-11 DIAGNOSIS — J45909 Unspecified asthma, uncomplicated: Secondary | ICD-10-CM | POA: Diagnosis not present

## 2016-10-11 DIAGNOSIS — J42 Unspecified chronic bronchitis: Secondary | ICD-10-CM | POA: Diagnosis not present

## 2016-10-11 DIAGNOSIS — E119 Type 2 diabetes mellitus without complications: Secondary | ICD-10-CM | POA: Diagnosis not present

## 2016-10-11 DIAGNOSIS — I69351 Hemiplegia and hemiparesis following cerebral infarction affecting right dominant side: Secondary | ICD-10-CM | POA: Diagnosis not present

## 2016-10-11 DIAGNOSIS — Z7984 Long term (current) use of oral hypoglycemic drugs: Secondary | ICD-10-CM | POA: Diagnosis not present

## 2016-10-11 DIAGNOSIS — Z8781 Personal history of (healed) traumatic fracture: Secondary | ICD-10-CM | POA: Diagnosis not present

## 2016-10-12 DIAGNOSIS — I10 Essential (primary) hypertension: Secondary | ICD-10-CM | POA: Diagnosis not present

## 2016-10-12 DIAGNOSIS — E785 Hyperlipidemia, unspecified: Secondary | ICD-10-CM | POA: Diagnosis not present

## 2016-10-12 DIAGNOSIS — E119 Type 2 diabetes mellitus without complications: Secondary | ICD-10-CM | POA: Diagnosis not present

## 2016-10-12 DIAGNOSIS — J42 Unspecified chronic bronchitis: Secondary | ICD-10-CM | POA: Diagnosis not present

## 2016-10-12 DIAGNOSIS — I69351 Hemiplegia and hemiparesis following cerebral infarction affecting right dominant side: Secondary | ICD-10-CM | POA: Diagnosis not present

## 2016-10-12 DIAGNOSIS — J45909 Unspecified asthma, uncomplicated: Secondary | ICD-10-CM | POA: Diagnosis not present

## 2016-10-12 DIAGNOSIS — Z7984 Long term (current) use of oral hypoglycemic drugs: Secondary | ICD-10-CM | POA: Diagnosis not present

## 2016-10-12 DIAGNOSIS — Z8781 Personal history of (healed) traumatic fracture: Secondary | ICD-10-CM | POA: Diagnosis not present

## 2016-10-15 DIAGNOSIS — E119 Type 2 diabetes mellitus without complications: Secondary | ICD-10-CM | POA: Diagnosis not present

## 2016-10-15 DIAGNOSIS — J42 Unspecified chronic bronchitis: Secondary | ICD-10-CM | POA: Diagnosis not present

## 2016-10-15 DIAGNOSIS — I10 Essential (primary) hypertension: Secondary | ICD-10-CM | POA: Diagnosis not present

## 2016-10-15 DIAGNOSIS — I69351 Hemiplegia and hemiparesis following cerebral infarction affecting right dominant side: Secondary | ICD-10-CM | POA: Diagnosis not present

## 2016-10-15 DIAGNOSIS — J45909 Unspecified asthma, uncomplicated: Secondary | ICD-10-CM | POA: Diagnosis not present

## 2016-10-15 DIAGNOSIS — Z7984 Long term (current) use of oral hypoglycemic drugs: Secondary | ICD-10-CM | POA: Diagnosis not present

## 2016-10-15 DIAGNOSIS — E785 Hyperlipidemia, unspecified: Secondary | ICD-10-CM | POA: Diagnosis not present

## 2016-10-15 DIAGNOSIS — I69359 Hemiplegia and hemiparesis following cerebral infarction affecting unspecified side: Secondary | ICD-10-CM | POA: Insufficient documentation

## 2016-10-15 DIAGNOSIS — Z8781 Personal history of (healed) traumatic fracture: Secondary | ICD-10-CM | POA: Diagnosis not present

## 2016-10-16 DIAGNOSIS — F1190 Opioid use, unspecified, uncomplicated: Secondary | ICD-10-CM | POA: Diagnosis not present

## 2016-10-16 DIAGNOSIS — I10 Essential (primary) hypertension: Secondary | ICD-10-CM | POA: Diagnosis not present

## 2016-10-16 DIAGNOSIS — G894 Chronic pain syndrome: Secondary | ICD-10-CM | POA: Diagnosis not present

## 2016-10-16 DIAGNOSIS — M47816 Spondylosis without myelopathy or radiculopathy, lumbar region: Secondary | ICD-10-CM | POA: Diagnosis not present

## 2016-10-16 DIAGNOSIS — M545 Low back pain: Secondary | ICD-10-CM | POA: Diagnosis not present

## 2016-10-17 ENCOUNTER — Ambulatory Visit (HOSPITAL_COMMUNITY)
Admission: RE | Admit: 2016-10-17 | Discharge: 2016-10-17 | Disposition: A | Discharge status: Home / Self Care | Payer: Commercial Managed Care - HMO | Source: Ambulatory Visit | Attending: Family Medicine | Admitting: Family Medicine

## 2016-10-17 ENCOUNTER — Other Ambulatory Visit: Payer: Self-pay | Admitting: Family Medicine

## 2016-10-17 DIAGNOSIS — E119 Type 2 diabetes mellitus without complications: Secondary | ICD-10-CM | POA: Diagnosis not present

## 2016-10-17 DIAGNOSIS — I69351 Hemiplegia and hemiparesis following cerebral infarction affecting right dominant side: Secondary | ICD-10-CM | POA: Diagnosis not present

## 2016-10-17 DIAGNOSIS — R200 Anesthesia of skin: Secondary | ICD-10-CM | POA: Diagnosis not present

## 2016-10-17 DIAGNOSIS — Z7984 Long term (current) use of oral hypoglycemic drugs: Secondary | ICD-10-CM | POA: Diagnosis not present

## 2016-10-17 DIAGNOSIS — G8191 Hemiplegia, unspecified affecting right dominant side: Secondary | ICD-10-CM

## 2016-10-17 DIAGNOSIS — J45909 Unspecified asthma, uncomplicated: Secondary | ICD-10-CM | POA: Diagnosis not present

## 2016-10-17 DIAGNOSIS — E785 Hyperlipidemia, unspecified: Secondary | ICD-10-CM | POA: Diagnosis not present

## 2016-10-17 DIAGNOSIS — I63312 Cerebral infarction due to thrombosis of left middle cerebral artery: Secondary | ICD-10-CM | POA: Diagnosis not present

## 2016-10-17 DIAGNOSIS — Z8781 Personal history of (healed) traumatic fracture: Secondary | ICD-10-CM | POA: Diagnosis not present

## 2016-10-17 DIAGNOSIS — I10 Essential (primary) hypertension: Secondary | ICD-10-CM | POA: Diagnosis not present

## 2016-10-17 DIAGNOSIS — J42 Unspecified chronic bronchitis: Secondary | ICD-10-CM | POA: Diagnosis not present

## 2016-10-17 MED ORDER — GADOBENATE DIMEGLUMINE 529 MG/ML IV SOLN
17.0000 mL | Freq: Once | INTRAVENOUS | Status: AC | PRN
  Administered 2016-10-17: 17 mL via INTRAVENOUS

## 2016-10-22 DIAGNOSIS — Z8781 Personal history of (healed) traumatic fracture: Secondary | ICD-10-CM | POA: Diagnosis not present

## 2016-10-22 DIAGNOSIS — E119 Type 2 diabetes mellitus without complications: Secondary | ICD-10-CM | POA: Diagnosis not present

## 2016-10-22 DIAGNOSIS — J45909 Unspecified asthma, uncomplicated: Secondary | ICD-10-CM | POA: Diagnosis not present

## 2016-10-22 DIAGNOSIS — E785 Hyperlipidemia, unspecified: Secondary | ICD-10-CM | POA: Diagnosis not present

## 2016-10-22 DIAGNOSIS — J42 Unspecified chronic bronchitis: Secondary | ICD-10-CM | POA: Diagnosis not present

## 2016-10-22 DIAGNOSIS — Z7984 Long term (current) use of oral hypoglycemic drugs: Secondary | ICD-10-CM | POA: Diagnosis not present

## 2016-10-22 DIAGNOSIS — I10 Essential (primary) hypertension: Secondary | ICD-10-CM | POA: Diagnosis not present

## 2016-10-22 DIAGNOSIS — I69351 Hemiplegia and hemiparesis following cerebral infarction affecting right dominant side: Secondary | ICD-10-CM | POA: Diagnosis not present

## 2016-10-23 ENCOUNTER — Encounter: Payer: Self-pay | Admitting: Neurology

## 2016-10-23 ENCOUNTER — Ambulatory Visit (INDEPENDENT_AMBULATORY_CARE_PROVIDER_SITE_OTHER): Payer: Commercial Managed Care - HMO | Admitting: Neurology

## 2016-10-23 VITALS — BP 146/68 | HR 72 | Ht 62.0 in | Wt 188.0 lb

## 2016-10-23 DIAGNOSIS — I10 Essential (primary) hypertension: Secondary | ICD-10-CM

## 2016-10-23 DIAGNOSIS — I6359 Cerebral infarction due to unspecified occlusion or stenosis of other cerebral artery: Secondary | ICD-10-CM

## 2016-10-23 DIAGNOSIS — Z794 Long term (current) use of insulin: Secondary | ICD-10-CM

## 2016-10-23 DIAGNOSIS — G8321 Monoplegia of upper limb affecting right dominant side: Secondary | ICD-10-CM

## 2016-10-23 DIAGNOSIS — E785 Hyperlipidemia, unspecified: Secondary | ICD-10-CM | POA: Diagnosis not present

## 2016-10-23 DIAGNOSIS — E119 Type 2 diabetes mellitus without complications: Secondary | ICD-10-CM | POA: Diagnosis not present
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
## 2016-10-23 NOTE — Patient Instructions (Signed)
I think you likely had a tiny stroke not seen on MRI 1.  Take aspirin 81mg  daily (instead of 325mg  daily) 2.  Continue cholesterol medication, blood pressure control and diabetes medication 3.  Cardiovascular exercise (walking on most days of week) 4.  Mediterranean diet  Mediterranean Diet A Mediterranean diet refers to food and lifestyle choices that are based on the traditions of countries located on the [employer]. This way of eating has been shown to help prevent certain conditions and improve outcomes for people who have chronic diseases, like kidney disease and heart disease. What are tips for following this plan? Lifestyle  Cook and eat meals together with your family, when possible.  Drink enough fluid to keep your urine clear or pale yellow.  Be physically active every day. This includes:  Aerobic exercise like running or swimming.  Leisure activities like gardening, walking, or housework.  Get 7-8 hours of sleep each night.  If recommended by your health care provider, drink red wine in moderation. This means 1 glass a day for nonpregnant women and 2 glasses a day for men. A glass of wine equals 5 oz (150 mL). Reading food labels  Check the serving size of packaged foods. For foods such as rice and pasta, the serving size refers to the amount of cooked product, not dry.  Check the total fat in packaged foods. Avoid foods that have saturated fat or trans fats.  Check the ingredients list for added sugars, such as corn syrup. Shopping  At the grocery store, buy most of your food from the areas near the walls of the store. This includes:  Fresh fruits and vegetables (produce).  Grains, beans, nuts, and seeds. Some of these may be available in unpackaged forms or large amounts (in bulk).  Fresh seafood.  Poultry and eggs.  Low-fat dairy products.  Buy whole ingredients instead of prepackaged foods.  Buy fresh fruits and vegetables in-season from local  farmers markets.  Buy frozen fruits and vegetables in resealable bags.  If you do not have access to quality fresh seafood, buy precooked frozen shrimp or canned fish, such as tuna, salmon, or sardines.  Buy small amounts of raw or cooked vegetables, salads, or olives from the deli or salad bar at your store.  Stock your pantry so you always have certain foods on hand, such as olive oil, canned tuna, canned tomatoes, rice, pasta, and beans. Cooking  Cook foods with extra-virgin olive oil instead of using butter or other vegetable oils.  Have meat as a side dish, and have vegetables or grains as your main dish. This means having meat in small portions or adding small amounts of meat to foods like pasta or stew.  Use beans or vegetables instead of meat in common dishes like chili or lasagna.  Experiment with different cooking methods. Try roasting or broiling vegetables instead of steaming or sauteing them.  Add frozen vegetables to soups, stews, pasta, or rice.  Add nuts or seeds for added healthy fat at each meal. You can add these to yogurt, salads, or vegetable dishes.  Marinate fish or vegetables using olive oil, lemon juice, garlic, and fresh herbs. Meal planning  Plan to eat 1 vegetarian meal one day each week. Try to work up to 2 vegetarian meals, if possible.  Eat seafood 2 or more times a week.  Have healthy snacks readily available, such as:  Vegetable sticks with hummus.  Greek yogurt.  Fruit and nut trail mix.  Eat  balanced meals throughout the week. This includes:  Fruit: 2-3 servings a day  Vegetables: 4-5 servings a day  Low-fat dairy: 2 servings a day  Fish, poultry, or lean meat: 1 serving a day  Beans and legumes: 2 or more servings a week  Nuts and seeds: 1-2 servings a day  Whole grains: 6-8 servings a day  Extra-virgin olive oil: 3-4 servings a day  Limit red meat and sweets to only a few servings a month What are my food  choices?  Mediterranean diet  Recommended  Grains: Whole-grain pasta. Brown rice. Bulgar wheat. Polenta. Couscous. Whole-wheat bread. [place].  Vegetables: Artichokes. Beets. Broccoli. Cabbage. Carrots. Eggplant. Green beans. Chard. Kale. Spinach. Onions. Leeks. Peas. Squash. Tomatoes. Peppers. Radishes.  Fruits: Apples. Apricots. Avocado. Berries. Bananas. Cherries. Dates. Figs. Grapes. Lemons. Melon. Oranges. Peaches. Plums. Pomegranate.  Meats and other protein foods: Beans. Almonds. Sunflower seeds. Pine nuts. Peanuts. [place]. Salmon. Scallops. Shrimp. [place]. Tilapia. Clams. Oysters. Eggs.  Dairy: Low-fat milk. Cheese. Greek yogurt.  Beverages: Water. Red wine. Herbal tea.  Fats and oils: Extra virgin olive oil. Avocado oil. Grape seed oil.  Sweets and desserts: [place] yogurt with honey. Baked apples. Poached pears. Trail mix.  Seasoning and other foods: Basil. Cilantro. Coriander. Cumin. Mint. Parsley. Sage. Rosemary. Tarragon. Garlic. Oregano. Thyme. Pepper. Balsalmic vinegar. Tahini. Hummus. Tomato sauce. Olives. Mushrooms.  Limit these  Grains: Prepackaged pasta or rice dishes. Prepackaged cereal with added sugar.  Vegetables: Deep fried potatoes (french fries).  Fruits: Fruit canned in syrup.  Meats and other protein foods: Beef. Pork. Lamb. Poultry with skin. Hot dogs. Berniece Salines.  Dairy: Ice cream. Sour cream. Whole milk.  Beverages: Juice. Sugar-sweetened soft drinks. Beer. Liquor and spirits.  Fats and oils: Butter. Canola oil. Vegetable oil. Beef fat (tallow). Lard.  Sweets and desserts: Cookies. Cakes. Pies. Candy.  Seasoning and other foods: Mayonnaise. Premade sauces and marinades.  The items listed may not be a complete list. Talk with your dietitian about what dietary choices are right for you. Summary  The Mediterranean diet includes both food and lifestyle choices.  Eat a variety of fresh fruits and vegetables, beans, nuts, seeds, and whole  grains.  Limit the amount of red meat and sweets that you eat.  Talk with your health care provider about whether it is safe for you to drink red wine in moderation. This means 1 glass a day for nonpregnant women and 2 glasses a day for men. A glass of wine equals 5 oz (150 mL). This information is not intended to replace advice given to you by your health care provider. Make sure you discuss any questions you have with your health care provider. Document Released: 07/05/2016 Document Revised: 08/07/2016 Document Reviewed: 07/05/2016 Elsevier Interactive Patient Education  2017 [place]. 5.  Follow up in 6 months.

## 2016-10-24 DIAGNOSIS — E119 Type 2 diabetes mellitus without complications: Secondary | ICD-10-CM | POA: Diagnosis not present

## 2016-10-24 DIAGNOSIS — J42 Unspecified chronic bronchitis: Secondary | ICD-10-CM | POA: Diagnosis not present

## 2016-10-24 DIAGNOSIS — I69351 Hemiplegia and hemiparesis following cerebral infarction affecting right dominant side: Secondary | ICD-10-CM | POA: Diagnosis not present

## 2016-10-24 DIAGNOSIS — E785 Hyperlipidemia, unspecified: Secondary | ICD-10-CM | POA: Diagnosis not present

## 2016-10-24 DIAGNOSIS — Z8781 Personal history of (healed) traumatic fracture: Secondary | ICD-10-CM | POA: Diagnosis not present

## 2016-10-24 DIAGNOSIS — Z7984 Long term (current) use of oral hypoglycemic drugs: Secondary | ICD-10-CM | POA: Diagnosis not present

## 2016-10-24 DIAGNOSIS — I10 Essential (primary) hypertension: Secondary | ICD-10-CM | POA: Diagnosis not present

## 2016-10-24 DIAGNOSIS — J45909 Unspecified asthma, uncomplicated: Secondary | ICD-10-CM | POA: Diagnosis not present

## 2016-10-25 ENCOUNTER — Telehealth: Payer: Self-pay | Admitting: Family Medicine

## 2016-10-25 ENCOUNTER — Ambulatory Visit (INDEPENDENT_AMBULATORY_CARE_PROVIDER_SITE_OTHER): Payer: Commercial Managed Care - HMO | Admitting: Family Medicine

## 2016-10-25 DIAGNOSIS — I69351 Hemiplegia and hemiparesis following cerebral infarction affecting right dominant side: Secondary | ICD-10-CM | POA: Diagnosis not present

## 2016-10-25 DIAGNOSIS — I10 Essential (primary) hypertension: Secondary | ICD-10-CM

## 2016-10-25 DIAGNOSIS — E785 Hyperlipidemia, unspecified: Secondary | ICD-10-CM | POA: Diagnosis not present

## 2016-10-25 DIAGNOSIS — E119 Type 2 diabetes mellitus without complications: Secondary | ICD-10-CM | POA: Diagnosis not present

## 2016-10-25 NOTE — Telephone Encounter (Signed)
Spoke with pt regarding edema Pt has been taking expired medication Will get new RX Elevate legs and maintain fluids Will schedule appt if sxs persist or worsen

## 2016-10-30 ENCOUNTER — Telehealth: Payer: Self-pay | Admitting: Family Medicine

## 2016-10-30 ENCOUNTER — Encounter: Payer: Self-pay | Admitting: Family Medicine

## 2016-10-30 ENCOUNTER — Ambulatory Visit (INDEPENDENT_AMBULATORY_CARE_PROVIDER_SITE_OTHER): Payer: Commercial Managed Care - HMO | Admitting: Family Medicine

## 2016-10-30 VITALS — BP 124/73 | HR 74 | Temp 97.4°F | Resp 20 | Ht 62.0 in | Wt 189.8 lb

## 2016-10-30 DIAGNOSIS — J329 Chronic sinusitis, unspecified: Secondary | ICD-10-CM | POA: Diagnosis not present

## 2016-10-30 DIAGNOSIS — J40 Bronchitis, not specified as acute or chronic: Secondary | ICD-10-CM

## 2016-10-30 MED ORDER — AZITHROMYCIN 250 MG PO TABS: ORAL_TABLET | ORAL | 0 refills | Status: DC

## 2016-10-30 NOTE — Patient Instructions (Signed)
Take CoQ10, 100 mg daily

## 2016-10-30 NOTE — Progress Notes (Signed)
 Subjective:  Patient ID: Yvonne [name], female    DOB: 06/22/1951  Age: 65 y.o. MRN: [id number]  CC: Cough and Sore Throat   HPI Yvonne [name] presents for Patient presents with upper respiratory congestion. Rhinorrhea that is frequently purulent. There is moderate sore throat. Patient reports coughing frequently as well.-colored/purulent sputum noted. There is no fever no chills no sweats. The patient denies being short of breath. Onset was 3-5 days ago. Gradually worsening in spite of home remedies.    Daughter - "She needs to come off of her Lipitor!"  History Yvonne [name] has a past medical history of Anxiety; Asthma; Cataract; Chronic bronchitis (HCC); Chronic headaches; Chronic leg pain; Clavicle fracture; Diabetes mellitus without complication (HCC); H/O seasonal allergies; Hyperlipidemia; Hypertension; Overactive bladder; and Stroke (HCC).   She has a past surgical history that includes Abdominal hysterectomy; Cholecystectomy; tear duct surgery (Bilateral); Colonoscopy; Colonoscopy (N/A, 08/17/2015); and Cataract extraction.   Her family history includes Arthritis in her brother and mother; Asthma in her father; CVA in her mother; Cancer in her brother; Diabetes in her father and mother; Heart disease in her father; Hepatitis C in her sister; Stroke in her mother.She reports that she has never smoked. She has never used smokeless tobacco. She reports that she does not drink alcohol or use drugs.    ROS Review of Systems  Objective:  BP 124/73   Pulse 74   Temp 97.4 F (36.3 C) (Oral)   Resp 20   Ht 5' 2" (1.575 m)   Wt 189 lb 12.8 oz (86.1 kg)   SpO2 100%   BMI 34.71 kg/m   BP Readings from Last 3 Encounters:  10/30/16 124/73  10/23/16 (!) 146/68  10/08/16 129/80    Wt Readings from Last 3 Encounters:  10/30/16 189 lb 12.8 oz (86.1 kg)  10/23/16 188 lb (85.3 kg)  10/08/16 186 lb (84.4 kg)     Physical Exam   Lab Results  Component Value Date   WBC 5.0  09/30/2016   HGB 12.3 09/30/2016   HCT 37.4 09/30/2016   PLT 202 09/30/2016   GLUCOSE 139 (H) 09/30/2016   CHOL 232 (H) 09/29/2016   TRIG 113 09/29/2016   HDL 46 09/29/2016   LDLCALC 163 (H) 09/29/2016   ALT 23 09/28/2016   AST 21 09/28/2016   NA 138 09/30/2016   K 3.9 09/30/2016   CL 108 09/30/2016   CREATININE 0.97 09/30/2016   BUN 16 09/30/2016   CO2 25 09/30/2016   TSH 1.580 06/03/2015   INR 0.86 09/28/2016   HGBA1C 6.9 (H) 09/29/2016    Mr Brain W Wo Contrast  Result Date: 10/17/2016 CLINICAL DATA:  65-year-old female with right side weakness and numbness. No known injury. Subsequent encounter. EXAM: MRI HEAD WITHOUT AND WITH CONTRAST TECHNIQUE: Multiplanar, multiecho pulse sequences of the brain and surrounding structures were obtained without and with intravenous contrast. CONTRAST:  17mL MULTIHANCE GADOBENATE DIMEGLUMINE 529 MG/ML IV SOLN COMPARISON:  Noncontrast head CT, Brain MRI and intracranial MRA 09/28/2016. FINDINGS: Brain: Cerebral volume is within normal limits for age. No restricted diffusion to suggest acute infarction. No midline shift, mass effect, evidence of mass lesion, ventriculomegaly, extra-axial collection or acute intracranial hemorrhage. Cervicomedullary junction and pituitary are within normal limits. Gray and white matter signal is stable and normal for age throughout the brain. No encephalomalacia or chronic cerebral blood products identified. Contrast administered today. No abnormal enhancement identified. No dural thickening. Vascular: Major intracranial vascular flow voids are stable   and within normal limits. Skull and upper cervical spine: Negative. Sinuses/Orbits: Stable orbits with postoperative changes to the right globe. Stable paranasal sinuses, partially collapsed left maxillary sinus. No significant sinus opacification. Mild right mastoid effusion is stable. Negative nasopharynx. Other: Visible internal auditory structures appear normal. Negative  scalp soft tissues. IMPRESSION: Stable and normal for age MRI appearance of the brain. No explanation for numbness or weakness. Electronically Signed   By: H  Hall M.D.   On: 10/17/2016 13:20    Assessment & Plan:   Yvonne [name] was seen today for cough and sore throat.  Diagnoses and all orders for this visit:  Sinobronchitis  Other orders -     azithromycin (ZITHROMAX Z-PAK) 250 MG tablet; Take two right away Then one a day for the next 4 days.     I have discontinued Ms. Pooler's atorvastatin. I am also having her start on azithromycin. Additionally, I am having her maintain her albuterol, blood glucose meter kit and supplies, HYDROcodone-acetaminophen, ezetimibe, albuterol, solifenacin, fluticasone furoate-vilanterol, ALPRAZolam, metFORMIN, pantoprazole, nitroGLYCERIN, aspirin, amLODipine, oxybutynin, omeprazole, and furosemide.  Meds ordered this encounter  Medications  . azithromycin (ZITHROMAX Z-PAK) 250 MG tablet    Sig: Take two right away Then one a day for the next 4 days.    Dispense:  6 each    Refill:  0     Follow-up: Return in about 6 weeks (around 12/11/2016).   , M.D. 

## 2016-11-05 ENCOUNTER — Telehealth: Payer: Self-pay | Admitting: Family Medicine

## 2016-11-05 ENCOUNTER — Other Ambulatory Visit: Payer: Self-pay | Admitting: Family Medicine

## 2016-11-05 MED ORDER — ATORVASTATIN CALCIUM 40 MG PO TABS: 40.0000 mg | ORAL_TABLET | Freq: Every day | ORAL | 1 refills | Status: DC

## 2016-11-05 NOTE — Telephone Encounter (Signed)
Spoke with pt and advised she is only to be taking the zetia and not the atorvastatin. Pt voiced understanding.

## 2016-11-13 DIAGNOSIS — M5442 Lumbago with sciatica, left side: Secondary | ICD-10-CM | POA: Diagnosis not present

## 2016-11-13 DIAGNOSIS — M47816 Spondylosis without myelopathy or radiculopathy, lumbar region: Secondary | ICD-10-CM | POA: Diagnosis not present

## 2016-11-13 DIAGNOSIS — I10 Essential (primary) hypertension: Secondary | ICD-10-CM | POA: Diagnosis not present

## 2016-11-13 DIAGNOSIS — G894 Chronic pain syndrome: Secondary | ICD-10-CM | POA: Diagnosis not present

## 2016-11-13 DIAGNOSIS — M791 Myalgia: Secondary | ICD-10-CM | POA: Diagnosis not present

## 2016-11-17 ENCOUNTER — Other Ambulatory Visit: Payer: Self-pay | Admitting: Family Medicine

## 2016-11-17 DIAGNOSIS — J441 Chronic obstructive pulmonary disease with (acute) exacerbation: Secondary | ICD-10-CM

## 2016-11-23 ENCOUNTER — Ambulatory Visit (INDEPENDENT_AMBULATORY_CARE_PROVIDER_SITE_OTHER): Payer: Commercial Managed Care - HMO | Admitting: Family

## 2016-11-23 ENCOUNTER — Encounter: Payer: Self-pay | Admitting: Family

## 2016-11-23 VITALS — BP 137/70 | HR 70 | Temp 98.0°F | Ht 62.0 in | Wt 188.0 lb

## 2016-11-23 DIAGNOSIS — J069 Acute upper respiratory infection, unspecified: Secondary | ICD-10-CM | POA: Diagnosis not present

## 2016-11-23 MED ORDER — FLUTICASONE PROPIONATE 50 MCG/ACT NA SUSP: 2.0000 | Freq: Every day | NASAL | 6 refills | Status: DC

## 2016-11-23 MED ORDER — BENZONATATE 200 MG PO CAPS: 200.0000 mg | ORAL_CAPSULE | Freq: Three times a day (TID) | ORAL | 1 refills | Status: DC | PRN

## 2016-11-23 NOTE — Patient Instructions (Signed)
Upper Respiratory Infection, Adult Most upper respiratory infections (URIs) are caused by a virus. A URI affects the nose, throat, and upper air passages. The most common type of URI is often called "the common cold." Follow these instructions at home:  Take medicines only as told by your doctor.  Gargle warm saltwater or take cough drops to comfort your throat as told by your doctor.  Use a warm mist humidifier or inhale steam from a shower to increase air moisture. This may make it easier to breathe.  Drink enough fluid to keep your pee (urine) clear or pale yellow.  Eat soups and other clear broths.  Have a healthy diet.  Rest as needed.  Go back to work when your fever is gone or your doctor says it is okay.  You may need to stay home longer to avoid giving your URI to others.  You can also wear a face mask and wash your hands often to prevent spread of the virus.  Use your inhaler more if you have asthma.  Do not use any tobacco products, including cigarettes, chewing tobacco, or electronic cigarettes. If you need help quitting, ask your doctor. Contact a doctor if:  You are getting worse, not better.  Your symptoms are not helped by medicine.  You have chills.  You are getting more short of breath.  You have brown or red mucus.  You have yellow or brown discharge from your nose.  You have pain in your face, especially when you bend forward.  You have a fever.  You have puffy (swollen) neck glands.  You have pain while swallowing.  You have white areas in the back of your throat. Get help right away if:  You have very bad or constant:  Headache.  Ear pain.  Pain in your forehead, behind your eyes, and over your cheekbones (sinus pain).  Chest pain.  You have long-lasting (chronic) lung disease and any of the following:  Wheezing.  Long-lasting cough.  Coughing up blood.  A change in your usual mucus.  You have a stiff neck.  You have  changes in your:  Vision.  Hearing.  Thinking.  Mood. This information is not intended to replace advice given to you by your health care provider. Make sure you discuss any questions you have with your health care provider. Document Released: 04/30/2008 Document Revised: 07/15/2016 Document Reviewed: 02/17/2014 Elsevier Interactive Patient Education  2017 Elsevier Inc.  

## 2016-11-23 NOTE — Progress Notes (Signed)
   Subjective:    Patient ID: Yvonne [name], female    DOB: [birth year], 65 y.o.   MRN: [id number]  Sinus Problem  This is a new problem. The current episode started in the past 7 days. The problem has been gradually worsening since onset. There has been no fever. Her pain is at a severity of 5/10. The pain is mild. Associated symptoms include chills, congestion, coughing, ear pain (right), headaches, a hoarse voice, sinus pressure and a sore throat. Pertinent negatives include no sneezing. Past treatments include lying down and oral decongestants. The treatment provided mild relief.  Cough  Associated symptoms include chills, ear pain (right), headaches and a sore throat.      Review of Systems  Constitutional: Positive for chills.  HENT: Positive for congestion, ear pain (right), hoarse voice, sinus pressure and sore throat. Negative for sneezing.   Respiratory: Positive for cough.   Neurological: Positive for headaches.  All other systems reviewed and are negative.      Objective:   Physical Exam  Constitutional: She is oriented to person, place, and time. She appears well-developed and well-nourished. No distress.  HENT:  Head: Normocephalic and atraumatic.  Right Ear: External ear normal.  Left Ear: External ear normal.  Nose: Mucosal edema and rhinorrhea present.  Mouth/Throat: Posterior oropharyngeal erythema present.  Eyes: Pupils are equal, round, and reactive to light.  Neck: Normal range of motion. Neck supple. No thyromegaly present.  Cardiovascular: Normal rate, regular rhythm, normal heart sounds and intact distal pulses.   No murmur heard. Pulmonary/Chest: Effort normal and breath sounds normal. No respiratory distress. She has no wheezes.  Abdominal: Soft. Bowel sounds are normal. She exhibits no distension. There is no tenderness.  Musculoskeletal: Normal range of motion. She exhibits no edema or tenderness.  Neurological: She is alert and oriented to person, place,  and time. She has normal reflexes. No cranial nerve deficit.  Skin: Skin is warm and dry.  Psychiatric: She has a normal mood and affect. Her behavior is normal. Judgment and thought content normal.  Vitals reviewed.     BP 137/70   Pulse 70   Temp 98 F (36.7 C) (Oral)   Ht 5\' 2"  (1.575 m)   Wt 188 lb (85.3 kg)   BMI 34.39 kg/m      Assessment & Plan:  1. Acute upper respiratory infection -- Take meds as prescribed - Use a cool mist humidifier  -Use saline nose sprays frequently -Saline irrigations of the nose can be very helpful if done frequently.  * 4X daily for 1 week*  * Use of a nettie pot can be helpful with this. Follow directions with this* -Force fluids -For any cough or congestion  Use plain Mucinex- regular strength or max strength is fine   * Children- consult with Pharmacist for dosing -For fever or aces or pains- take tylenol or ibuprofen appropriate for age and weight.  * for fevers greater than 101 orally you may alternate ibuprofen and tylenol every  3 hours. -Throat lozenges if help - fluticasone (FLONASE) 50 MCG/ACT nasal spray; Place 2 sprays into both nostrils daily.  Dispense: 16 g; Refill: 6 - benzonatate (TESSALON) 200 MG capsule; Take 1 capsule (200 mg total) by mouth 3 (three) times daily as needed.  Dispense: 30 capsule; Refill: [place], FNP

## 2016-11-26 ENCOUNTER — Emergency Department (HOSPITAL_COMMUNITY)
Admission: EM | Admit: 2016-11-26 | Discharge: 2016-11-26 | Disposition: A | Discharge status: Home / Self Care | Payer: Medicare HMO | Attending: Emergency Medicine | Admitting: Emergency Medicine

## 2016-11-26 ENCOUNTER — Encounter (HOSPITAL_COMMUNITY): Payer: Self-pay | Admitting: Emergency Medicine

## 2016-11-26 ENCOUNTER — Emergency Department (HOSPITAL_COMMUNITY): Payer: Medicare HMO

## 2016-11-26 ENCOUNTER — Other Ambulatory Visit: Payer: Self-pay | Admitting: Family Medicine

## 2016-11-26 DIAGNOSIS — R079 Chest pain, unspecified: Secondary | ICD-10-CM

## 2016-11-26 DIAGNOSIS — R1013 Epigastric pain: Secondary | ICD-10-CM | POA: Diagnosis not present

## 2016-11-26 DIAGNOSIS — J45909 Unspecified asthma, uncomplicated: Secondary | ICD-10-CM | POA: Insufficient documentation

## 2016-11-26 DIAGNOSIS — E119 Type 2 diabetes mellitus without complications: Secondary | ICD-10-CM | POA: Insufficient documentation

## 2016-11-26 DIAGNOSIS — R202 Paresthesia of skin: Secondary | ICD-10-CM | POA: Diagnosis not present

## 2016-11-26 DIAGNOSIS — Z7984 Long term (current) use of oral hypoglycemic drugs: Secondary | ICD-10-CM | POA: Diagnosis not present

## 2016-11-26 DIAGNOSIS — R200 Anesthesia of skin: Secondary | ICD-10-CM | POA: Diagnosis not present

## 2016-11-26 DIAGNOSIS — I10 Essential (primary) hypertension: Secondary | ICD-10-CM | POA: Diagnosis not present

## 2016-11-26 DIAGNOSIS — Z79899 Other long term (current) drug therapy: Secondary | ICD-10-CM | POA: Diagnosis not present

## 2016-11-26 DIAGNOSIS — R0789 Other chest pain: Secondary | ICD-10-CM | POA: Diagnosis not present

## 2016-11-26 LAB — COMPREHENSIVE METABOLIC PANEL
ALT: 22 U/L (ref 14–54)
AST: 25 U/L (ref 15–41)
Albumin: 3.6 g/dL (ref 3.5–5.0)
Alkaline Phosphatase: 108 U/L (ref 38–126)
Anion gap: 6 (ref 5–15)
BUN: 18 mg/dL (ref 6–20)
CHLORIDE: 104 mmol/L (ref 101–111)
CO2: 28 mmol/L (ref 22–32)
Calcium: 9.2 mg/dL (ref 8.9–10.3)
Creatinine, Ser: 0.79 mg/dL (ref 0.44–1.00)
Glucose, Bld: 155 mg/dL — ABNORMAL HIGH (ref 65–99)
POTASSIUM: 3.4 mmol/L — AB (ref 3.5–5.1)
Sodium: 138 mmol/L (ref 135–145)
Total Bilirubin: 0.6 mg/dL (ref 0.3–1.2)
Total Protein: 6.7 g/dL (ref 6.5–8.1)

## 2016-11-26 LAB — CBC WITH DIFFERENTIAL/PLATELET
Basophils Absolute: 0 K/uL (ref 0.0–0.1)
Basophils Relative: 0 %
Eosinophils Absolute: 0.1 K/uL (ref 0.0–0.7)
Eosinophils Relative: 2 %
HCT: 36.5 % (ref 36.0–46.0)
Hemoglobin: 12.4 g/dL (ref 12.0–15.0)
Lymphocytes Relative: 43 %
Lymphs Abs: 2.8 K/uL (ref 0.7–4.0)
MCH: 29.7 pg (ref 26.0–34.0)
MCHC: 34 g/dL (ref 30.0–36.0)
MCV: 87.3 fL (ref 78.0–100.0)
Monocytes Absolute: 0.5 K/uL (ref 0.1–1.0)
Monocytes Relative: 8 %
Neutro Abs: 3 K/uL (ref 1.7–7.7)
Neutrophils Relative %: 47 %
Platelets: 238 K/uL (ref 150–400)
RBC: 4.18 MIL/uL (ref 3.87–5.11)
RDW: 13 % (ref 11.5–15.5)
WBC: 6.5 K/uL (ref 4.0–10.5)

## 2016-11-26 LAB — I-STAT TROPONIN, ED
TROPONIN I, POC: 0 ng/mL (ref 0.00–0.08)
Troponin i, poc: 0.01 ng/mL (ref 0.00–0.08)

## 2016-11-26 MED ORDER — GI COCKTAIL ~~LOC~~
30.0000 mL | Freq: Once | ORAL | Status: AC
  Administered 2016-11-26: 30 mL via ORAL
  Filled 2016-11-26: qty 30

## 2016-11-26 MED ORDER — SUCRALFATE 1 G PO TABS: 1.0000 g | ORAL_TABLET | Freq: Three times a day (TID) | ORAL | 0 refills | Status: DC

## 2016-11-26 NOTE — ED Notes (Signed)
Patient given discharge instruction, verbalized understand. IV removed, band aid applied. Patient ambulatory out of the department.  
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
## 2016-11-26 NOTE — ED Triage Notes (Signed)
Pt states she began having tingling in right leg about 2am.  Started having chest pain at 7am.  Also c/o nausea.

## 2016-11-26 NOTE — Discharge Instructions (Signed)
Follow-up with your family doctor as planned this week 

## 2016-11-30 ENCOUNTER — Ambulatory Visit: Payer: Commercial Managed Care - HMO | Admitting: Family Medicine

## 2016-12-04 ENCOUNTER — Ambulatory Visit: Payer: Commercial Managed Care - HMO | Admitting: Family Medicine

## 2016-12-10 ENCOUNTER — Ambulatory Visit (INDEPENDENT_AMBULATORY_CARE_PROVIDER_SITE_OTHER): Payer: Medicare HMO | Admitting: Family Medicine

## 2016-12-10 ENCOUNTER — Encounter (INDEPENDENT_AMBULATORY_CARE_PROVIDER_SITE_OTHER): Payer: Self-pay

## 2016-12-10 ENCOUNTER — Encounter: Payer: Self-pay | Admitting: Family Medicine

## 2016-12-10 VITALS — BP 138/80 | HR 67 | Temp 97.4°F | Ht 62.0 in | Wt 190.0 lb

## 2016-12-10 DIAGNOSIS — J329 Chronic sinusitis, unspecified: Secondary | ICD-10-CM | POA: Diagnosis not present

## 2016-12-10 DIAGNOSIS — J40 Bronchitis, not specified as acute or chronic: Secondary | ICD-10-CM

## 2016-12-10 MED ORDER — BETAMETHASONE SOD PHOS & ACET 6 (3-3) MG/ML IJ SUSP
6.0000 mg | Freq: Once | INTRAMUSCULAR | Status: AC
  Administered 2016-12-10: 6 mg via INTRAMUSCULAR

## 2016-12-10 MED ORDER — LEVOFLOXACIN 500 MG PO TABS: 500.0000 mg | ORAL_TABLET | Freq: Every day | ORAL | 0 refills | Status: DC

## 2016-12-10 NOTE — Patient Instructions (Signed)
`   Decrease gabapentin to 300 mg at bedtime. Please check in with the pain clinic regarding the side effects are having from the medicine and asked if they can increase the medicine more slowly to accommodate your symptoms and side effects.

## 2016-12-10 NOTE — Progress Notes (Signed)
Subjective:  Patient ID: Yvonne [name], female    DOB: [birth year]  Age: 66 y.o. MRN: [id number]  CC: Hypertension (pt here today for routine follow up on BP and also c/o having a cough and congestion)   HPI Yvonne [name] presents for Patient presents with upper respiratory congestion. Rhinorrhea that is frequently purulent. There is moderate sore throat. Patient reports coughing frequently as well.green purulent sputum noted. There is no fever no chills no sweats. The patient denies being short of breath. Onset was 7-10 days ago. Gradually worsening in spite of home remedies. Feels BP up due to feeling so bad.  Pain continues. She says though when she tries to titrate the gabapentin that all she can do is sleep except that when she tries to active she so dizzy she can't stand up straight. She says that they're trying to titrate very quickly. She can tolerate one at bedtime but so far that's the best she can do without symptoms.    History Yvonne [name] has a past medical history of Anxiety; Asthma; Cataract; Chronic bronchitis ([place]); Chronic headaches; Chronic leg pain; Clavicle fracture; Diabetes mellitus without complication (HCC); H/O seasonal allergies; Hyperlipidemia; Hypertension; Overactive bladder; and Stroke ([place]).   She has a past surgical history that includes Abdominal hysterectomy; Cholecystectomy; tear duct surgery (Bilateral); Colonoscopy; Colonoscopy (N/A, 08/17/2015); and Cataract extraction.   Her family history includes Arthritis in her brother and mother; Asthma in her father; CVA in her mother; Cancer in her brother; Diabetes in her father and mother; Heart disease in her father; Hepatitis C in her sister; Stroke in her mother.She reports that she has never smoked. She has never used smokeless tobacco. She reports that she does not drink alcohol or use drugs.    ROS Review of Systems  Constitutional: Negative for activity change, appetite change, chills and fever.  HENT:  Positive for congestion, postnasal drip, rhinorrhea and sinus pressure. Negative for ear discharge, ear pain, hearing loss, nosebleeds, sneezing and trouble swallowing.   Respiratory: Positive for cough, shortness of breath and wheezing. Negative for chest tightness.   Cardiovascular: Negative for chest pain and palpitations.  Skin: Negative for rash.    Objective:  BP (!) 148/83   Pulse 67   Temp 97.4 F (36.3 C) (Oral)   Ht '5\' 2"'$  (1.575 m)   Wt 190 lb (86.2 kg)   BMI 34.75 kg/m   BP Readings from Last 3 Encounters:  12/10/16 (!) 148/83  11/26/16 143/74  11/23/16 137/70    Wt Readings from Last 3 Encounters:  12/10/16 190 lb (86.2 kg)  11/26/16 186 lb (84.4 kg)  11/23/16 188 lb (85.3 kg)     Physical Exam  Constitutional: She appears well-developed and well-nourished.  HENT:  Head: Normocephalic and atraumatic.  Right Ear: Tympanic membrane and external ear normal. No decreased hearing is noted.  Left Ear: Tympanic membrane and external ear normal. No decreased hearing is noted.  Nose: Mucosal edema present. Right sinus exhibits no frontal sinus tenderness. Left sinus exhibits no frontal sinus tenderness.  Mouth/Throat: No oropharyngeal exudate or posterior oropharyngeal erythema.  Neck: No Brudzinski's sign noted.  Pulmonary/Chest: No respiratory distress. She has wheezes (scattered. Decreased exp. phase).  Lymphadenopathy:       Head (right side): No preauricular adenopathy present.       Head (left side): No preauricular adenopathy present.       Right cervical: No superficial cervical adenopathy present.      Left cervical: No superficial cervical  adenopathy present.    Dg Chest 2 View  Result Date: 11/26/2016 CLINICAL DATA:  Chest pain, nausea EXAM: CHEST  2 VIEW COMPARISON:  08/05/2016 FINDINGS: Heart and mediastinal contours are within normal limits. No focal opacities or effusions. No acute bony abnormality. IMPRESSION: No active cardiopulmonary disease.  Electronically Signed   By: Rolm Baptise M.D.   On: 11/26/2016 14:01   Ct Head Wo Contrast  Result Date: 11/26/2016 CLINICAL DATA:  Right leg and hand tingling. EXAM: CT HEAD WITHOUT CONTRAST TECHNIQUE: Contiguous axial images were obtained from the base of the skull through the vertex without intravenous contrast. COMPARISON:  MRI 10/17/2016 FINDINGS: Brain: No acute intracranial abnormality. Specifically, no hemorrhage, hydrocephalus, mass lesion, acute infarction, or significant intracranial injury. Vascular: No hyperdense vessel or unexpected calcification. Skull: No acute calvarial abnormality. Sinuses/Orbits: Visualized paranasal sinuses and mastoids clear. Orbital soft tissues unremarkable. Other: None IMPRESSION: No acute intracranial abnormality. Electronically Signed   By: Rolm Baptise M.D.   On: 11/26/2016 14:15    Assessment & Plan:   Yvonne [name] was seen today for hypertension.  Diagnoses and all orders for this visit:  Sinobronchitis -     betamethasone acetate-betamethasone sodium phosphate (CELESTONE) injection 6 mg; Inject 1 mL (6 mg total) into the muscle once.  Other orders -     levofloxacin (LEVAQUIN) 500 MG tablet; Take 1 tablet (500 mg total) by mouth daily. For 10 days    I have discontinued Ms. Lawler's blood glucose meter kit and supplies and benzonatate. I am also having her start on levofloxacin. Additionally, I am having her maintain her HYDROcodone-acetaminophen, ezetimibe, albuterol, solifenacin, ALPRAZolam, metFORMIN, amLODipine, oxybutynin, omeprazole, furosemide, atorvastatin, BREO ELLIPTA, fluticasone, pantoprazole, sucralfate, gabapentin, and albuterol. We will continue to administer betamethasone acetate-betamethasone sodium phosphate.  Allergies as of 12/10/2016      Reactions   Lyrica [pregabalin]    Penicillins Hives   Has patient had a PCN reaction causing immediate rash, facial/tongue/throat swelling, SOB or lightheadedness with hypotension: Yes Has  patient had a PCN reaction causing severe rash involving mucus membranes or skin necrosis: No Has patient had a PCN reaction that required hospitalization No Has patient had a PCN reaction occurring within the last 10 years: No If all of the above answers are "NO", then may proceed with Cephalosporin use.   Lisinopril Cough      Medication List       Accurate as of 12/10/16 11:23 AM. Always use your most recent med list.          albuterol (2.5 MG/3ML) 0.083% nebulizer solution Commonly known as:  PROVENTIL Take 2.5 mg by nebulization every 6 (six) hours as needed for wheezing or shortness of breath.   albuterol 108 (90 Base) MCG/ACT inhaler Commonly known as:  PROVENTIL HFA;VENTOLIN HFA Inhale 2 puffs into the lungs every 6 (six) hours as needed for wheezing or shortness of breath.   ALPRAZolam 1 MG tablet Commonly known as:  XANAX Take 1 tablet (1 mg total) by mouth 3 (three) times daily. TAKE (1) TABLET THREE TIMES DAILY.   amLODipine 5 MG tablet Commonly known as:  NORVASC TAKE 1 TABLET (5 MG TOTAL) BY MOUTH DAILY.   atorvastatin 40 MG tablet Commonly known as:  LIPITOR Take 1 tablet (40 mg total) by mouth daily.   BREO ELLIPTA 100-25 MCG/INH Aepb Generic drug:  fluticasone furoate-vilanterol INHALE 1 PUFF INTO THE LUNGS DAILY.   ezetimibe 10 MG tablet Commonly known as:  ZETIA Take 1 tablet (10 mg  total) by mouth daily. For cholesterol   fluticasone 50 MCG/ACT nasal spray Commonly known as:  FLONASE Place 2 sprays into both nostrils daily.   furosemide 20 MG tablet Commonly known as:  LASIX TAKE 1 TABLET (20 MG TOTAL) BY MOUTH DAILY.   gabapentin 300 MG capsule Commonly known as:  NEURONTIN Take 1,200 mg by mouth 2 (two) times daily.   HYDROcodone-acetaminophen 10-325 MG tablet Commonly known as:  NORCO Take 1 tablet by mouth every 6 (six) hours as needed for moderate pain. Reported on 02/07/2016   levofloxacin 500 MG tablet Commonly known as:   LEVAQUIN Take 1 tablet (500 mg total) by mouth daily. For 10 days   metFORMIN 500 MG 24 hr tablet Commonly known as:  GLUCOPHAGE-XR Take 2 tablets (1,000 mg total) by mouth daily with breakfast.   omeprazole 20 MG capsule Commonly known as:  PRILOSEC TAKE 1 CAPSULE (20 MG TOTAL) BY MOUTH DAILY.   oxybutynin 15 MG 24 hr tablet Commonly known as:  DITROPAN XL TAKE 1 TABLET (15 MG TOTAL) BY MOUTH 2 (TWO) TIMES DAILY.   pantoprazole 40 MG tablet Commonly known as:  PROTONIX TAKE 1 TABLET (40 MG TOTAL) BY MOUTH 2 (TWO) TIMES DAILY. FOR STOMACH   solifenacin 10 MG tablet Commonly known as:  VESICARE Take 1 tablet (10 mg total) by mouth daily.   sucralfate 1 g tablet Commonly known as:  CARAFATE Take 1 tablet (1 g total) by mouth 4 (four) times daily -  with meals and at bedtime.        Follow-up: Return in about 4 weeks (around 01/07/2017) for hypertension.  [name], M.D.

## 2016-12-11 ENCOUNTER — Other Ambulatory Visit: Payer: Self-pay | Admitting: Family Medicine

## 2016-12-11 DIAGNOSIS — Z1231 Encounter for screening mammogram for malignant neoplasm of breast: Secondary | ICD-10-CM

## 2016-12-13 DIAGNOSIS — M5442 Lumbago with sciatica, left side: Secondary | ICD-10-CM | POA: Diagnosis not present

## 2016-12-13 DIAGNOSIS — M533 Sacrococcygeal disorders, not elsewhere classified: Secondary | ICD-10-CM | POA: Diagnosis not present

## 2016-12-13 DIAGNOSIS — I10 Essential (primary) hypertension: Secondary | ICD-10-CM | POA: Diagnosis not present

## 2016-12-13 DIAGNOSIS — G894 Chronic pain syndrome: Secondary | ICD-10-CM | POA: Diagnosis not present

## 2016-12-13 DIAGNOSIS — M545 Low back pain: Secondary | ICD-10-CM | POA: Diagnosis not present

## 2016-12-20 ENCOUNTER — Encounter: Payer: Self-pay | Admitting: Family Medicine

## 2016-12-20 ENCOUNTER — Telehealth: Payer: Self-pay | Admitting: Family Medicine

## 2016-12-20 ENCOUNTER — Ambulatory Visit (INDEPENDENT_AMBULATORY_CARE_PROVIDER_SITE_OTHER): Payer: Medicare HMO | Admitting: Family Medicine

## 2016-12-20 VITALS — BP 155/79 | HR 71 | Temp 97.8°F | Ht 62.0 in | Wt 188.0 lb

## 2016-12-20 DIAGNOSIS — J111 Influenza due to unidentified influenza virus with other respiratory manifestations: Secondary | ICD-10-CM | POA: Diagnosis not present

## 2016-12-20 MED ORDER — OSELTAMIVIR PHOSPHATE 75 MG PO CAPS: 75.0000 mg | ORAL_CAPSULE | Freq: Two times a day (BID) | ORAL | 0 refills | Status: DC

## 2016-12-20 MED ORDER — HYDROCODONE-HOMATROPINE 5-1.5 MG/5ML PO SYRP: 5.0000 mL | ORAL_SOLUTION | Freq: Four times a day (QID) | ORAL | 0 refills | Status: DC | PRN

## 2016-12-20 NOTE — Progress Notes (Signed)
Subjective:  Patient ID: Yvonne [name], female    DOB: [birth year]  Age: 66 y.o. MRN: [id number]  CC: Cough (pt here today c/o cough, fever and just "feels terrible")   HPI Yvonne [name] presents for  Patient presents with dry cough runny stuffy nose. Diffuse headache of moderate intensity. Patient also has chills and subjective fever. Body aches worst in the back but present in the legs, shoulders, and torso as well. Has sapped the energy to the point that of being unable to perform usual activities other than ADLs. Onset 2 days ago.   History Yvonne [name] has a past medical history of Anxiety; Asthma; Cataract; Chronic bronchitis ([place]); Chronic headaches; Chronic leg pain; Clavicle fracture; Diabetes mellitus without complication (HCC); H/O seasonal allergies; Hyperlipidemia; Hypertension; Overactive bladder; and Stroke ([place]).   She has a past surgical history that includes Abdominal hysterectomy; Cholecystectomy; tear duct surgery (Bilateral); Colonoscopy; Colonoscopy (N/A, 08/17/2015); and Cataract extraction.   Her family history includes Arthritis in her brother and mother; Asthma in her father; CVA in her mother; Cancer in her brother; Diabetes in her father and mother; Heart disease in her father; Hepatitis C in her sister; Stroke in her mother.She reports that she has never smoked. She has never used smokeless tobacco. She reports that she does not drink alcohol or use drugs.  Current Outpatient Prescriptions on File Prior to Visit  Medication Sig Dispense Refill  . albuterol (PROVENTIL HFA;VENTOLIN HFA) 108 (90 Base) MCG/ACT inhaler Inhale 2 puffs into the lungs every 6 (six) hours as needed for wheezing or shortness of breath. 1 Inhaler 2  . albuterol (PROVENTIL) (2.5 MG/3ML) 0.083% nebulizer solution Take 2.5 mg by nebulization every 6 (six) hours as needed for wheezing or shortness of breath.    . ALPRAZolam (XANAX) 1 MG tablet Take 1 tablet (1 mg total) by mouth 3 (three) times daily.  TAKE (1) TABLET THREE TIMES DAILY. (Patient taking differently: Take 1 mg by mouth 3 (three) times daily. ) 90 tablet 5  . amLODipine (NORVASC) 5 MG tablet TAKE 1 TABLET (5 MG TOTAL) BY MOUTH DAILY. (Patient taking differently: TAKE 1 TABLET (5 MG TOTAL) BY TWICE MOUTH DAILY.) 90 tablet 1  . atorvastatin (LIPITOR) 40 MG tablet Take 1 tablet (40 mg total) by mouth daily. 90 tablet 1  . BREO ELLIPTA 100-25 MCG/INH AEPB INHALE 1 PUFF INTO THE LUNGS DAILY. 180 each 0  . ezetimibe (ZETIA) 10 MG tablet Take 1 tablet (10 mg total) by mouth daily. For cholesterol (Patient taking differently: Take 10 mg by mouth every evening. For cholesterol) 90 tablet 3  . fluticasone (FLONASE) 50 MCG/ACT nasal spray Place 2 sprays into both nostrils daily. 16 g 6  . furosemide (LASIX) 20 MG tablet TAKE 1 TABLET (20 MG TOTAL) BY MOUTH DAILY. 90 tablet 1  . gabapentin (NEURONTIN) 300 MG capsule Take 300 mg by mouth at bedtime.    [name] Kitchen HYDROcodone-acetaminophen (NORCO) 10-325 MG tablet Take 1 tablet by mouth every 6 (six) hours as needed for moderate pain. Reported on 02/07/2016 60 tablet 0  . metFORMIN (GLUCOPHAGE-XR) 500 MG 24 hr tablet Take 2 tablets (1,000 mg total) by mouth daily with breakfast. (Patient taking differently: Take 1,000 mg by mouth every evening. ) 60 tablet 5  . omeprazole (PRILOSEC) 20 MG capsule TAKE 1 CAPSULE (20 MG TOTAL) BY MOUTH DAILY. 90 capsule 1  . oxybutynin (DITROPAN XL) 15 MG 24 hr tablet TAKE 1 TABLET (15 MG TOTAL) BY MOUTH 2 (TWO) TIMES  DAILY. 180 tablet 1  . pantoprazole (PROTONIX) 40 MG tablet TAKE 1 TABLET (40 MG TOTAL) BY MOUTH 2 (TWO) TIMES DAILY. FOR STOMACH 60 tablet 4  . solifenacin (VESICARE) 10 MG tablet Take 1 tablet (10 mg total) by mouth daily. 30 tablet 2  . sucralfate (CARAFATE) 1 g tablet Take 1 tablet (1 g total) by mouth 4 (four) times daily -  with meals and at bedtime. 120 tablet 0   No current facility-administered medications on file prior to visit.     ROS Review of  Systems  Constitutional: Positive for appetite change, chills and fever.  HENT: Positive for congestion and rhinorrhea. Negative for ear pain, nosebleeds, postnasal drip, sinus pressure and sore throat.   Respiratory: Negative for chest tightness and shortness of breath.   Cardiovascular: Negative for chest pain.  Musculoskeletal: Positive for myalgias.  Skin: Negative for rash.  Neurological: Positive for headaches.    Objective:  BP (!) 167/86   Pulse 71   Temp 97.8 F (36.6 C) (Oral)   Ht 5\' 2"  (1.575 m)   Wt 188 lb (85.3 kg)   BMI 34.39 kg/m   Physical Exam  Constitutional: She is oriented to person, place, and time. She appears well-developed and well-nourished. No distress.  HENT:  Head: Normocephalic and atraumatic.  Eyes: Conjunctivae are normal. Pupils are equal, round, and reactive to light.  Neck: Normal range of motion. Neck supple. No thyromegaly present.  Cardiovascular: Normal rate, regular rhythm and normal heart sounds.   No murmur heard. Pulmonary/Chest: Effort normal and breath sounds normal. No respiratory distress. She has no wheezes. She has no rales.  Abdominal: Soft. Bowel sounds are normal. She exhibits no distension. There is no tenderness.  Musculoskeletal: Normal range of motion.  Lymphadenopathy:    She has no cervical adenopathy.  Neurological: She is alert and oriented to person, place, and time.  Skin: Skin is warm and dry.  Psychiatric: She has a normal mood and affect. Her behavior is normal. Judgment and thought content normal.    Assessment & Plan:   Yvonne [name] was seen today for cough.  Diagnoses and all orders for this visit:  Influenza with respiratory manifestation  Other orders -     oseltamivir (TAMIFLU) 75 MG capsule; Take 1 capsule (75 mg total) by mouth 2 (two) times daily. -     HYDROcodone-homatropine (HYCODAN) 5-1.5 MG/5ML syrup; Take 5 mLs by mouth every 6 (six) hours as needed for cough.   I have discontinued Yvonne [name]'s  levofloxacin. I am also having her start on oseltamivir and HYDROcodone-homatropine. Additionally, I am having her maintain her HYDROcodone-acetaminophen, ezetimibe, albuterol, solifenacin, ALPRAZolam, metFORMIN, amLODipine, oxybutynin, omeprazole, furosemide, atorvastatin, BREO ELLIPTA, fluticasone, pantoprazole, sucralfate, gabapentin, and albuterol.  Meds ordered this encounter  Medications  . oseltamivir (TAMIFLU) 75 MG capsule    Sig: Take 1 capsule (75 mg total) by mouth 2 (two) times daily.    Dispense:  10 capsule    Refill:  0  . HYDROcodone-homatropine (HYCODAN) 5-1.5 MG/5ML syrup    Sig: Take 5 mLs by mouth every 6 (six) hours as needed for cough.    Dispense:  120 mL    Refill:  0     Follow-up: Return in about 3 months (around 03/20/2017), or if symptoms worsen or fail to improve.  [name], M.D.

## 2016-12-20 NOTE — Telephone Encounter (Signed)
Request entered for family members for Tamiflu

## 2016-12-26 ENCOUNTER — Other Ambulatory Visit: Payer: Self-pay | Admitting: Family Medicine

## 2016-12-26 DIAGNOSIS — J441 Chronic obstructive pulmonary disease with (acute) exacerbation: Secondary | ICD-10-CM

## 2016-12-31 ENCOUNTER — Other Ambulatory Visit: Payer: Self-pay | Admitting: Family Medicine

## 2017-01-03 DIAGNOSIS — M25551 Pain in right hip: Secondary | ICD-10-CM | POA: Diagnosis not present

## 2017-01-03 DIAGNOSIS — Z5181 Encounter for therapeutic drug level monitoring: Secondary | ICD-10-CM | POA: Diagnosis not present

## 2017-01-03 DIAGNOSIS — M791 Myalgia: Secondary | ICD-10-CM | POA: Diagnosis not present

## 2017-01-03 DIAGNOSIS — G894 Chronic pain syndrome: Secondary | ICD-10-CM | POA: Diagnosis not present

## 2017-01-03 DIAGNOSIS — I10 Essential (primary) hypertension: Secondary | ICD-10-CM | POA: Diagnosis not present

## 2017-01-03 DIAGNOSIS — Z79899 Other long term (current) drug therapy: Secondary | ICD-10-CM | POA: Diagnosis not present

## 2017-01-03 DIAGNOSIS — F1190 Opioid use, unspecified, uncomplicated: Secondary | ICD-10-CM | POA: Diagnosis not present

## 2017-01-11 ENCOUNTER — Ambulatory Visit: Payer: Medicare HMO | Admitting: Family Medicine

## 2017-01-12 ENCOUNTER — Other Ambulatory Visit: Payer: Self-pay | Admitting: Family Medicine

## 2017-01-16 ENCOUNTER — Encounter: Payer: Self-pay | Admitting: Family Medicine

## 2017-01-16 ENCOUNTER — Ambulatory Visit (INDEPENDENT_AMBULATORY_CARE_PROVIDER_SITE_OTHER): Payer: Medicare HMO | Admitting: Family Medicine

## 2017-01-16 VITALS — BP 132/80 | HR 73 | Temp 97.4°F | Ht 62.0 in | Wt 188.0 lb

## 2017-01-16 DIAGNOSIS — J0101 Acute recurrent maxillary sinusitis: Secondary | ICD-10-CM | POA: Diagnosis not present

## 2017-01-16 MED ORDER — SULFAMETHOXAZOLE-TRIMETHOPRIM 800-160 MG PO TABS: 1.0000 | ORAL_TABLET | Freq: Two times a day (BID) | ORAL | 0 refills | Status: DC

## 2017-01-16 MED ORDER — BETAMETHASONE SOD PHOS & ACET 6 (3-3) MG/ML IJ SUSP
6.0000 mg | Freq: Once | INTRAMUSCULAR | Status: AC
  Administered 2017-01-16: 6 mg via INTRAMUSCULAR

## 2017-01-16 NOTE — Progress Notes (Signed)
Subjective:  Patient ID: Yvonne [name], female    DOB: 02/01/1951  Age: 66 y.o. MRN: [id number]  CC: Ear Pain (pt here today for a one month follow up, she is still c/o sinus problems)   HPI Yvonne [name] presents for Symptoms include congestion, facial pain, nasal congestion, maxillary sinus pressure. No  Fever. Has non productive cough, post nasal drip. Ears feel mildly uncomfortable. Onset of symptoms was a few days ago, gradually worsening since that time. Pt.is drinking moderate amounts of fluids.     History Yvonne [name] has a past medical history of Anxiety; Asthma; Cataract; Chronic bronchitis ([place]); Chronic headaches; Chronic leg pain; Clavicle fracture; Diabetes mellitus without complication (HCC); H/O seasonal allergies; Hyperlipidemia; Hypertension; Overactive bladder; and Stroke ([place]).   She has a past surgical history that includes Abdominal hysterectomy; Cholecystectomy; tear duct surgery (Bilateral); Colonoscopy; Colonoscopy (N/A, 08/17/2015); and Cataract extraction.   Her family history includes Arthritis in her brother and mother; Asthma in her father; CVA in her mother; Cancer in her brother; Diabetes in her father and mother; Heart disease in her father; Hepatitis C in her sister; Stroke in her mother.She reports that she has never smoked. She has never used smokeless tobacco. She reports that she does not drink alcohol or use drugs.    ROS Review of Systems  Constitutional: Negative for activity change, appetite change, chills and fever.  HENT: Positive for congestion, postnasal drip, rhinorrhea and sinus pressure. Negative for ear discharge, ear pain, hearing loss, nosebleeds, sneezing and trouble swallowing.   Respiratory: Negative for chest tightness and shortness of breath.   Cardiovascular: Negative for chest pain and palpitations.  Skin: Negative for rash.    Objective:  BP 132/80   Pulse 73   Temp 97.4 F (36.3 C) (Oral)   Ht 5\' 2"  (1.575 m)   Wt 188 lb (85.3  kg)   BMI 34.39 kg/m   BP Readings from Last 3 Encounters:  01/16/17 132/80  12/20/16 (!) 155/79  12/10/16 138/80    Wt Readings from Last 3 Encounters:  01/16/17 188 lb (85.3 kg)  12/20/16 188 lb (85.3 kg)  12/10/16 190 lb (86.2 kg)     Physical Exam  Constitutional: She appears well-developed and well-nourished.  HENT:  Head: Normocephalic and atraumatic.  Right Ear: Tympanic membrane and external ear normal. No decreased hearing is noted.  Left Ear: Tympanic membrane and external ear normal. No decreased hearing is noted.  Nose: Mucosal edema present. Right sinus exhibits no frontal sinus tenderness. Left sinus exhibits no frontal sinus tenderness.  Mouth/Throat: No oropharyngeal exudate or posterior oropharyngeal erythema.  Neck: No Brudzinski's sign noted.  Pulmonary/Chest: Breath sounds normal. No respiratory distress.  Lymphadenopathy:       Head (right side): No preauricular adenopathy present.       Head (left side): No preauricular adenopathy present.       Right cervical: No superficial cervical adenopathy present.      Left cervical: No superficial cervical adenopathy present.    Dg Chest 2 View  Result Date: 11/26/2016 CLINICAL DATA:  Chest pain, nausea EXAM: CHEST  2 VIEW COMPARISON:  08/05/2016 FINDINGS: Heart and mediastinal contours are within normal limits. No focal opacities or effusions. No acute bony abnormality. IMPRESSION: No active cardiopulmonary disease. Electronically Signed   By: Rolm Baptise M.D.   On: 11/26/2016 14:01   Ct Head Wo Contrast  Result Date: 11/26/2016 CLINICAL DATA:  Right leg and hand tingling. EXAM: CT HEAD WITHOUT CONTRAST  TECHNIQUE: Contiguous axial images were obtained from the base of the skull through the vertex without intravenous contrast. COMPARISON:  MRI 10/17/2016 FINDINGS: Brain: No acute intracranial abnormality. Specifically, no hemorrhage, hydrocephalus, mass lesion, acute infarction, or significant intracranial injury.  Vascular: No hyperdense vessel or unexpected calcification. Skull: No acute calvarial abnormality. Sinuses/Orbits: Visualized paranasal sinuses and mastoids clear. Orbital soft tissues unremarkable. Other: None IMPRESSION: No acute intracranial abnormality. Electronically Signed   By: Rolm Baptise M.D.   On: 11/26/2016 14:15    Assessment & Plan:   Yvonne [name] was seen today for ear pain.  Diagnoses and all orders for this visit:  Acute recurrent maxillary sinusitis -     betamethasone acetate-betamethasone sodium phosphate (CELESTONE) injection 6 mg; Inject 1 mL (6 mg total) into the muscle once.  Other orders -     sulfamethoxazole-trimethoprim (BACTRIM DS,SEPTRA DS) 800-160 MG tablet; Take 1 tablet by mouth 2 (two) times daily. For extended, 1 mo sinus tx    I have discontinued Yvonne [name]'s oxybutynin, omeprazole, atorvastatin, fluticasone, sucralfate, and oseltamivir. I am also having her start on sulfamethoxazole-trimethoprim. Additionally, I am having her maintain her HYDROcodone-acetaminophen, ezetimibe, ALPRAZolam, amLODipine, furosemide, BREO ELLIPTA, pantoprazole, gabapentin, albuterol, HYDROcodone-homatropine, VENTOLIN HFA, VESICARE, and metFORMIN. We administered betamethasone acetate-betamethasone sodium phosphate.  Allergies as of 01/16/2017      Reactions   Lyrica [pregabalin]    Penicillins Hives   Has patient had a PCN reaction causing immediate rash, facial/tongue/throat swelling, SOB or lightheadedness with hypotension: Yes Has patient had a PCN reaction causing severe rash involving mucus membranes or skin necrosis: No Has patient had a PCN reaction that required hospitalization No Has patient had a PCN reaction occurring within the last 10 years: No If all of the above answers are "NO", then may proceed with Cephalosporin use.   Lisinopril Cough      Medication List       Accurate as of 01/16/17 11:59 PM. Always use your most recent med list.          albuterol (2.5  MG/3ML) 0.083% nebulizer solution Commonly known as:  PROVENTIL Take 2.5 mg by nebulization every 6 (six) hours as needed for wheezing or shortness of breath.   VENTOLIN HFA 108 (90 Base) MCG/ACT inhaler Generic drug:  albuterol INHALE 2 PUFFS INTO THE LUNGS EVERY 6 (SIX) HOURS AS NEEDED FOR WHEEZING OR SHORTNESS OF BREATH.   ALPRAZolam 1 MG tablet Commonly known as:  XANAX Take 1 tablet (1 mg total) by mouth 3 (three) times daily. TAKE (1) TABLET THREE TIMES DAILY.   amLODipine 5 MG tablet Commonly known as:  NORVASC TAKE 1 TABLET (5 MG TOTAL) BY MOUTH DAILY.   BREO ELLIPTA 100-25 MCG/INH Aepb Generic drug:  fluticasone furoate-vilanterol INHALE 1 PUFF INTO THE LUNGS DAILY.   ezetimibe 10 MG tablet Commonly known as:  ZETIA Take 1 tablet (10 mg total) by mouth daily. For cholesterol   furosemide 20 MG tablet Commonly known as:  LASIX TAKE 1 TABLET (20 MG TOTAL) BY MOUTH DAILY.   gabapentin 300 MG capsule Commonly known as:  NEURONTIN Take 300 mg by mouth at bedtime.   HYDROcodone-acetaminophen 10-325 MG tablet Commonly known as:  NORCO Take 1 tablet by mouth every 6 (six) hours as needed for moderate pain. Reported on 02/07/2016   HYDROcodone-homatropine 5-1.5 MG/5ML syrup Commonly known as:  HYCODAN Take 5 mLs by mouth every 6 (six) hours as needed for cough.   metFORMIN 500 MG 24 hr tablet Commonly known as:  GLUCOPHAGE-XR TAKE 2 TABLETS (1,000 MG TOTAL) BY MOUTH DAILY WITH BREAKFAST.   pantoprazole 40 MG tablet Commonly known as:  PROTONIX TAKE 1 TABLET (40 MG TOTAL) BY MOUTH 2 (TWO) TIMES DAILY. FOR STOMACH   sulfamethoxazole-trimethoprim 800-160 MG tablet Commonly known as:  BACTRIM DS,SEPTRA DS Take 1 tablet by mouth 2 (two) times daily. For extended, 1 mo sinus tx   VESICARE 10 MG tablet Generic drug:  solifenacin TAKE 1 TABLET (10 MG TOTAL) BY MOUTH DAILY.        Follow-up: No Follow-up on file.  [name], M.D.

## 2017-01-21 ENCOUNTER — Inpatient Hospital Stay (HOSPITAL_COMMUNITY): Admission: RE | Admit: 2017-01-21 | Payer: Medicare HMO | Source: Ambulatory Visit

## 2017-01-28 DIAGNOSIS — M4726 Other spondylosis with radiculopathy, lumbar region: Secondary | ICD-10-CM | POA: Diagnosis not present

## 2017-01-28 DIAGNOSIS — I10 Essential (primary) hypertension: Secondary | ICD-10-CM | POA: Diagnosis not present

## 2017-01-28 DIAGNOSIS — M791 Myalgia: Secondary | ICD-10-CM | POA: Diagnosis not present

## 2017-01-28 DIAGNOSIS — G894 Chronic pain syndrome: Secondary | ICD-10-CM | POA: Diagnosis not present

## 2017-01-28 DIAGNOSIS — M533 Sacrococcygeal disorders, not elsewhere classified: Secondary | ICD-10-CM | POA: Diagnosis not present

## 2017-02-18 ENCOUNTER — Other Ambulatory Visit: Payer: Self-pay | Admitting: Family Medicine

## 2017-02-18 DIAGNOSIS — J441 Chronic obstructive pulmonary disease with (acute) exacerbation: Secondary | ICD-10-CM

## 2017-02-20 ENCOUNTER — Other Ambulatory Visit: Payer: Self-pay | Admitting: Family Medicine

## 2017-02-20 DIAGNOSIS — Z1231 Encounter for screening mammogram for malignant neoplasm of breast: Secondary | ICD-10-CM

## 2017-02-25 ENCOUNTER — Ambulatory Visit (HOSPITAL_COMMUNITY): Payer: Medicare HMO

## 2017-02-26 ENCOUNTER — Telehealth: Payer: Self-pay | Admitting: Family Medicine

## 2017-02-27 DIAGNOSIS — G8929 Other chronic pain: Secondary | ICD-10-CM | POA: Insufficient documentation

## 2017-02-27 DIAGNOSIS — M5442 Lumbago with sciatica, left side: Secondary | ICD-10-CM | POA: Insufficient documentation

## 2017-02-27 DIAGNOSIS — M5441 Lumbago with sciatica, right side: Secondary | ICD-10-CM | POA: Diagnosis not present

## 2017-02-27 DIAGNOSIS — G894 Chronic pain syndrome: Secondary | ICD-10-CM | POA: Diagnosis not present

## 2017-02-27 DIAGNOSIS — M47816 Spondylosis without myelopathy or radiculopathy, lumbar region: Secondary | ICD-10-CM | POA: Diagnosis not present

## 2017-02-27 DIAGNOSIS — I10 Essential (primary) hypertension: Secondary | ICD-10-CM | POA: Diagnosis not present

## 2017-02-28 ENCOUNTER — Ambulatory Visit (HOSPITAL_COMMUNITY)
Admission: RE | Admit: 2017-02-28 | Discharge: 2017-02-28 | Disposition: A | Discharge status: Home / Self Care | Payer: Medicare HMO | Source: Ambulatory Visit | Attending: Family Medicine | Admitting: Family Medicine

## 2017-02-28 DIAGNOSIS — Z1231 Encounter for screening mammogram for malignant neoplasm of breast: Secondary | ICD-10-CM | POA: Insufficient documentation

## 2017-03-21 ENCOUNTER — Emergency Department (HOSPITAL_COMMUNITY): Payer: Medicare Other

## 2017-03-21 ENCOUNTER — Inpatient Hospital Stay (HOSPITAL_COMMUNITY): Payer: Medicare Other

## 2017-03-21 ENCOUNTER — Encounter (HOSPITAL_COMMUNITY): Payer: Self-pay

## 2017-03-21 ENCOUNTER — Inpatient Hospital Stay (HOSPITAL_COMMUNITY)
Admission: EM | Admit: 2017-03-21 | Discharge: 2017-03-22 | DRG: 069 | Disposition: A | Discharge status: Home / Self Care | Payer: Medicare Other | Attending: Nephrology | Admitting: Nephrology

## 2017-03-21 DIAGNOSIS — Z825 Family history of asthma and other chronic lower respiratory diseases: Secondary | ICD-10-CM

## 2017-03-21 DIAGNOSIS — Z90710 Acquired absence of both cervix and uterus: Secondary | ICD-10-CM

## 2017-03-21 DIAGNOSIS — Z9849 Cataract extraction status, unspecified eye: Secondary | ICD-10-CM | POA: Diagnosis not present

## 2017-03-21 DIAGNOSIS — G8929 Other chronic pain: Secondary | ICD-10-CM | POA: Diagnosis not present

## 2017-03-21 DIAGNOSIS — E785 Hyperlipidemia, unspecified: Secondary | ICD-10-CM | POA: Diagnosis not present

## 2017-03-21 DIAGNOSIS — I639 Cerebral infarction, unspecified: Secondary | ICD-10-CM | POA: Diagnosis present

## 2017-03-21 DIAGNOSIS — I5032 Chronic diastolic (congestive) heart failure: Secondary | ICD-10-CM | POA: Diagnosis not present

## 2017-03-21 DIAGNOSIS — R29810 Facial weakness: Secondary | ICD-10-CM | POA: Diagnosis not present

## 2017-03-21 DIAGNOSIS — Z8673 Personal history of transient ischemic attack (TIA), and cerebral infarction without residual deficits: Secondary | ICD-10-CM | POA: Diagnosis not present

## 2017-03-21 DIAGNOSIS — Z833 Family history of diabetes mellitus: Secondary | ICD-10-CM

## 2017-03-21 DIAGNOSIS — Z7984 Long term (current) use of oral hypoglycemic drugs: Secondary | ICD-10-CM | POA: Diagnosis not present

## 2017-03-21 DIAGNOSIS — R202 Paresthesia of skin: Secondary | ICD-10-CM | POA: Diagnosis not present

## 2017-03-21 DIAGNOSIS — G894 Chronic pain syndrome: Secondary | ICD-10-CM | POA: Diagnosis not present

## 2017-03-21 DIAGNOSIS — R7303 Prediabetes: Secondary | ICD-10-CM

## 2017-03-21 DIAGNOSIS — Z66 Do not resuscitate: Secondary | ICD-10-CM | POA: Diagnosis present

## 2017-03-21 DIAGNOSIS — I10 Essential (primary) hypertension: Secondary | ICD-10-CM | POA: Diagnosis not present

## 2017-03-21 DIAGNOSIS — Z823 Family history of stroke: Secondary | ICD-10-CM

## 2017-03-21 DIAGNOSIS — I110 Hypertensive heart disease with heart failure: Secondary | ICD-10-CM | POA: Diagnosis present

## 2017-03-21 DIAGNOSIS — I493 Ventricular premature depolarization: Secondary | ICD-10-CM | POA: Diagnosis present

## 2017-03-21 DIAGNOSIS — G451 Carotid artery syndrome (hemispheric): Secondary | ICD-10-CM | POA: Diagnosis not present

## 2017-03-21 DIAGNOSIS — Z888 Allergy status to other drugs, medicaments and biological substances status: Secondary | ICD-10-CM

## 2017-03-21 DIAGNOSIS — Z880 Allergy status to penicillin: Secondary | ICD-10-CM | POA: Diagnosis not present

## 2017-03-21 DIAGNOSIS — G459 Transient cerebral ischemic attack, unspecified: Secondary | ICD-10-CM | POA: Diagnosis not present

## 2017-03-21 DIAGNOSIS — E119 Type 2 diabetes mellitus without complications: Secondary | ICD-10-CM | POA: Diagnosis not present

## 2017-03-21 DIAGNOSIS — J45909 Unspecified asthma, uncomplicated: Secondary | ICD-10-CM | POA: Diagnosis present

## 2017-03-21 DIAGNOSIS — I5030 Unspecified diastolic (congestive) heart failure: Secondary | ICD-10-CM | POA: Diagnosis present

## 2017-03-21 DIAGNOSIS — F419 Anxiety disorder, unspecified: Secondary | ICD-10-CM

## 2017-03-21 DIAGNOSIS — N3281 Overactive bladder: Secondary | ICD-10-CM | POA: Diagnosis not present

## 2017-03-21 DIAGNOSIS — R51 Headache: Secondary | ICD-10-CM | POA: Diagnosis not present

## 2017-03-21 DIAGNOSIS — I6789 Other cerebrovascular disease: Secondary | ICD-10-CM | POA: Diagnosis not present

## 2017-03-21 DIAGNOSIS — Z8249 Family history of ischemic heart disease and other diseases of the circulatory system: Secondary | ICD-10-CM

## 2017-03-21 DIAGNOSIS — Z79899 Other long term (current) drug therapy: Secondary | ICD-10-CM

## 2017-03-21 DIAGNOSIS — F411 Generalized anxiety disorder: Secondary | ICD-10-CM | POA: Diagnosis present

## 2017-03-21 DIAGNOSIS — R531 Weakness: Secondary | ICD-10-CM | POA: Diagnosis not present

## 2017-03-21 LAB — COMPREHENSIVE METABOLIC PANEL
ALK PHOS: 110 U/L (ref 38–126)
ALT: 35 U/L (ref 14–54)
ANION GAP: 9 (ref 5–15)
AST: 33 U/L (ref 15–41)
Albumin: 3.8 g/dL (ref 3.5–5.0)
BUN: 15 mg/dL (ref 6–20)
CALCIUM: 9.7 mg/dL (ref 8.9–10.3)
CO2: 25 mmol/L (ref 22–32)
CREATININE: 0.98 mg/dL (ref 0.44–1.00)
Chloride: 105 mmol/L (ref 101–111)
GFR, EST NON AFRICAN AMERICAN: 58 mL/min — AB (ref >60)
Glucose, Bld: 133 mg/dL — ABNORMAL HIGH (ref 65–99)
Potassium: 3.5 mmol/L (ref 3.5–5.1)
Sodium: 139 mmol/L (ref 135–145)
Total Bilirubin: 0.5 mg/dL (ref 0.3–1.2)
Total Protein: 6.5 g/dL (ref 6.5–8.1)

## 2017-03-21 LAB — I-STAT CHEM 8, ED
BUN: 19 mg/dL (ref 6–20)
CALCIUM ION: 1.1 mmol/L — AB (ref 1.15–1.40)
CHLORIDE: 104 mmol/L (ref 101–111)
CREATININE: 1 mg/dL (ref 0.44–1.00)
GLUCOSE: 130 mg/dL — AB (ref 65–99)
HCT: 38 % (ref 36.0–46.0)
Hemoglobin: 12.9 g/dL (ref 12.0–15.0)
Potassium: 3.5 mmol/L (ref 3.5–5.1)
Sodium: 139 mmol/L (ref 135–145)
TCO2: 28 mmol/L (ref 0–100)

## 2017-03-21 LAB — CBC
HEMATOCRIT: 39.5 % (ref 36.0–46.0)
Hemoglobin: 13.2 g/dL (ref 12.0–15.0)
MCH: 28.3 pg (ref 26.0–34.0)
MCHC: 33.4 g/dL (ref 30.0–36.0)
MCV: 84.6 fL (ref 78.0–100.0)
PLATELETS: 228 10*3/uL (ref 150–400)
RBC: 4.67 MIL/uL (ref 3.87–5.11)
RDW: 13.4 % (ref 11.5–15.5)
WBC: 6.7 10*3/uL (ref 4.0–10.5)

## 2017-03-21 LAB — I-STAT TROPONIN, ED: TROPONIN I, POC: 0 ng/mL (ref 0.00–0.08)

## 2017-03-21 LAB — CBG MONITORING, ED: GLUCOSE-CAPILLARY: 114 mg/dL — AB (ref 65–99)

## 2017-03-21 LAB — DIFFERENTIAL
BASOS PCT: 0 %
Basophils Absolute: 0 10*3/uL (ref 0.0–0.1)
EOS PCT: 1 %
Eosinophils Absolute: 0.1 10*3/uL (ref 0.0–0.7)
LYMPHS PCT: 45 %
Lymphs Abs: 3 10*3/uL (ref 0.7–4.0)
MONO ABS: 0.5 10*3/uL (ref 0.1–1.0)
MONOS PCT: 7 %
NEUTROS ABS: 3.2 10*3/uL (ref 1.7–7.7)
Neutrophils Relative %: 47 %

## 2017-03-21 LAB — GLUCOSE, CAPILLARY: Glucose-Capillary: 121 mg/dL — ABNORMAL HIGH (ref 65–99)

## 2017-03-21 LAB — APTT: aPTT: 31 seconds (ref 24–36)

## 2017-03-21 LAB — PROTIME-INR
INR: 0.97
PROTHROMBIN TIME: 12.9 s (ref 11.4–15.2)

## 2017-03-21 MED ORDER — ASPIRIN 300 MG RE SUPP: 300.0000 mg | Freq: Every day | RECTAL | Status: DC

## 2017-03-21 MED ORDER — STROKE: EARLY STAGES OF RECOVERY BOOK
Freq: Once | Status: AC
  Administered 2017-03-21: 22:00:00
  Filled 2017-03-21: qty 1

## 2017-03-21 MED ORDER — INSULIN ASPART 100 UNIT/ML ~~LOC~~ SOLN: 0.0000 [IU] | Freq: Every day | SUBCUTANEOUS | Status: DC

## 2017-03-21 MED ORDER — SENNOSIDES-DOCUSATE SODIUM 8.6-50 MG PO TABS
1.0000 | ORAL_TABLET | Freq: Every evening | ORAL | Status: DC | PRN
  Filled 2017-03-21: qty 1

## 2017-03-21 MED ORDER — HYDROCODONE-ACETAMINOPHEN 10-325 MG PO TABS: 1.0000 | ORAL_TABLET | Freq: Three times a day (TID) | ORAL | Status: DC | PRN

## 2017-03-21 MED ORDER — ALPRAZOLAM 0.25 MG PO TABS
1.0000 mg | ORAL_TABLET | Freq: Three times a day (TID) | ORAL | Status: DC
  Administered 2017-03-21: 1 mg via ORAL
  Filled 2017-03-21: qty 4

## 2017-03-21 MED ORDER — ACETAMINOPHEN 160 MG/5ML PO SOLN: 650.0000 mg | ORAL | Status: DC | PRN

## 2017-03-21 MED ORDER — FLUTICASONE FUROATE-VILANTEROL 100-25 MCG/INH IN AEPB
1.0000 | INHALATION_SPRAY | Freq: Every day | RESPIRATORY_TRACT | Status: DC
  Administered 2017-03-22: 1 via RESPIRATORY_TRACT
  Filled 2017-03-21: qty 28

## 2017-03-21 MED ORDER — IOPAMIDOL (ISOVUE-370) INJECTION 76%
INTRAVENOUS | Status: AC
  Filled 2017-03-21: qty 50

## 2017-03-21 MED ORDER — HYDROCODONE-ACETAMINOPHEN 10-325 MG PO TABS: 1.0000 | ORAL_TABLET | Freq: Four times a day (QID) | ORAL | Status: DC | PRN

## 2017-03-21 MED ORDER — ACETAMINOPHEN 650 MG RE SUPP: 650.0000 mg | RECTAL | Status: DC | PRN

## 2017-03-21 MED ORDER — PANTOPRAZOLE SODIUM 40 MG PO TBEC
40.0000 mg | DELAYED_RELEASE_TABLET | Freq: Two times a day (BID) | ORAL | Status: DC
  Administered 2017-03-21 – 2017-03-22 (×2): 40 mg via ORAL
  Filled 2017-03-21 (×2): qty 1

## 2017-03-21 MED ORDER — INSULIN ASPART 100 UNIT/ML ~~LOC~~ SOLN: 0.0000 [IU] | Freq: Three times a day (TID) | SUBCUTANEOUS | Status: DC

## 2017-03-21 MED ORDER — ACETAMINOPHEN 325 MG PO TABS: 650.0000 mg | ORAL_TABLET | ORAL | Status: DC | PRN

## 2017-03-21 MED ORDER — LABETALOL HCL 5 MG/ML IV SOLN: 5.0000 mg | INTRAVENOUS | Status: DC | PRN

## 2017-03-21 MED ORDER — ENOXAPARIN SODIUM 40 MG/0.4ML ~~LOC~~ SOLN: 40.0000 mg | SUBCUTANEOUS | Status: DC

## 2017-03-21 MED ORDER — ASPIRIN 325 MG PO TABS
325.0000 mg | ORAL_TABLET | Freq: Every day | ORAL | Status: DC
  Administered 2017-03-21 – 2017-03-22 (×2): 325 mg via ORAL
  Filled 2017-03-21 (×2): qty 1

## 2017-03-21 MED ORDER — DARIFENACIN HYDROBROMIDE ER 15 MG PO TB24
15.0000 mg | ORAL_TABLET | Freq: Every day | ORAL | Status: DC
  Administered 2017-03-22: 15 mg via ORAL
  Filled 2017-03-21: qty 1

## 2017-03-21 MED ORDER — GABAPENTIN 300 MG PO CAPS
300.0000 mg | ORAL_CAPSULE | Freq: Every day | ORAL | Status: DC
  Administered 2017-03-21: 300 mg via ORAL
  Filled 2017-03-21: qty 1

## 2017-03-21 MED ORDER — EZETIMIBE 10 MG PO TABS: 10.0000 mg | ORAL_TABLET | Freq: Every evening | ORAL | Status: DC

## 2017-03-21 MED ORDER — ALBUTEROL SULFATE (2.5 MG/3ML) 0.083% IN NEBU: 2.5000 mg | INHALATION_SOLUTION | Freq: Four times a day (QID) | RESPIRATORY_TRACT | Status: DC | PRN
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
## 2017-03-21 NOTE — ED Triage Notes (Signed)
Pt brought in by [facility]. Pt c/o right sided leg weakness and numbness. Pt LSN 1530. Pt denies any weakness on arrival. Some right sided drift noted in arm and leg. Pt CBG 143 with EMS. Denies any headache, slurred speech at this time.

## 2017-03-21 NOTE — ED Notes (Signed)
Initial NIH charted under incorrect user. NIH and acknowledged orders charged by Merilyn Baba RN
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
## 2017-03-22 ENCOUNTER — Inpatient Hospital Stay (HOSPITAL_COMMUNITY): Payer: Medicare Other

## 2017-03-22 DIAGNOSIS — I10 Essential (primary) hypertension: Secondary | ICD-10-CM | POA: Diagnosis not present

## 2017-03-22 DIAGNOSIS — I5032 Chronic diastolic (congestive) heart failure: Secondary | ICD-10-CM | POA: Diagnosis not present

## 2017-03-22 DIAGNOSIS — Z7984 Long term (current) use of oral hypoglycemic drugs: Secondary | ICD-10-CM | POA: Diagnosis not present

## 2017-03-22 DIAGNOSIS — Z9849 Cataract extraction status, unspecified eye: Secondary | ICD-10-CM | POA: Diagnosis not present

## 2017-03-22 DIAGNOSIS — G451 Carotid artery syndrome (hemispheric): Secondary | ICD-10-CM | POA: Diagnosis not present

## 2017-03-22 DIAGNOSIS — I6789 Other cerebrovascular disease: Secondary | ICD-10-CM | POA: Diagnosis not present

## 2017-03-22 DIAGNOSIS — Z8673 Personal history of transient ischemic attack (TIA), and cerebral infarction without residual deficits: Secondary | ICD-10-CM | POA: Diagnosis not present

## 2017-03-22 DIAGNOSIS — R7303 Prediabetes: Secondary | ICD-10-CM | POA: Diagnosis not present

## 2017-03-22 DIAGNOSIS — I110 Hypertensive heart disease with heart failure: Secondary | ICD-10-CM | POA: Diagnosis not present

## 2017-03-22 DIAGNOSIS — G459 Transient cerebral ischemic attack, unspecified: Secondary | ICD-10-CM

## 2017-03-22 DIAGNOSIS — E119 Type 2 diabetes mellitus without complications: Secondary | ICD-10-CM | POA: Diagnosis not present

## 2017-03-22 DIAGNOSIS — Z90710 Acquired absence of both cervix and uterus: Secondary | ICD-10-CM | POA: Diagnosis not present

## 2017-03-22 DIAGNOSIS — I639 Cerebral infarction, unspecified: Secondary | ICD-10-CM | POA: Diagnosis not present

## 2017-03-22 DIAGNOSIS — E785 Hyperlipidemia, unspecified: Secondary | ICD-10-CM | POA: Diagnosis not present

## 2017-03-22 LAB — LIPID PANEL
CHOLESTEROL: 210 mg/dL — AB (ref 0–200)
HDL: 40 mg/dL — ABNORMAL LOW (ref >40)
LDL CALC: 133 mg/dL — AB (ref 0–99)
TRIGLYCERIDES: 185 mg/dL — AB (ref <150)
Total CHOL/HDL Ratio: 5.3 RATIO
VLDL: 37 mg/dL (ref 0–40)

## 2017-03-22 LAB — ECHOCARDIOGRAM COMPLETE
Ao-asc: 30 cm
CHL CUP DOP CALC LVOT VTI: 18.2 cm
CHL CUP RV SYS PRESS: 19 mmHg
CHL CUP TV REG PEAK VELOCITY: 201 cm/s
EERAT: 17.43
EWDT: 303 ms
FS: 32 % (ref 28–44)
Height: 62 in
IVS/LV PW RATIO, ED: 0.93
LA ID, A-P, ES: 33 mm
LA diam end sys: 33 mm
LA vol index: 17.5 mL/m2
LADIAMINDEX: 1.78 cm/m2
LAVOL: 32.3 mL
LAVOLA4C: 31.8 mL
LV E/e' medial: 17.43
LV E/e'average: 17.43
LV PW d: 12.6 mm — AB (ref 0.6–1.1)
LV e' LATERAL: 4.12 cm/s
LVOT SV: 52 mL
LVOT area: 2.84 cm2
LVOT diameter: 19 mm
LVOT peak vel: 95.3 cm/s
MV Dec: 303
MV Peak grad: 2 mmHg
MV pk E vel: 71.8 m/s
MVPKAVEL: 109 m/s
RV LATERAL S' VELOCITY: 10.9 cm/s
RV TAPSE: 19.1 mm
TDI e' lateral: 4.12
TDI e' medial: 7.15
TRMAXVEL: 201 cm/s
Weight: 2973.56 oz

## 2017-03-22 LAB — GLUCOSE, CAPILLARY
GLUCOSE-CAPILLARY: 141 mg/dL — AB (ref 65–99)
GLUCOSE-CAPILLARY: 147 mg/dL — AB (ref 65–99)
Glucose-Capillary: 155 mg/dL — ABNORMAL HIGH (ref 65–99)

## 2017-03-22 MED ORDER — ASPIRIN 325 MG PO TABS
325.0000 mg | ORAL_TABLET | Freq: Every day | ORAL | 0 refills | Status: DC
Start: 2017-03-23 — End: 2018-06-20

## 2017-03-22 MED ORDER — ATORVASTATIN CALCIUM 10 MG PO TABS: 20.0000 mg | ORAL_TABLET | Freq: Every day | ORAL | Status: DC

## 2017-03-22 MED ORDER — ATORVASTATIN CALCIUM 20 MG PO TABS: 20.0000 mg | ORAL_TABLET | Freq: Every day | ORAL | 0 refills | Status: DC

## 2017-03-22 NOTE — Progress Notes (Signed)
  Echocardiogram 2D Echocardiogram has been performed.  Yvonne [name] 03/22/2017, 1:03 PM

## 2017-03-22 NOTE — Progress Notes (Signed)
Occupational Therapy Evaluation Patient Details Name: Yvonne [name] MRN: [id number] DOB: 1951/04/11 Today's Date: 03/22/2017    History of Present Illness Pt is a 66 y/o female presenting with R UE and LE weakness. 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 PMH including but not limited to DM, HTN and hx of CVA.   Clinical Impression   PTA, pt independent with ADL and mobility, drove and was active with her grandchildren. Pt appears to be functioning close to baseline. No apparent deficits. Pt reports "I feel normal again". Pt states that she could not move her R arm or leg and was seeing spots "when this happened". Pt educated on signs/symptoms of CVA using [employer]. Pt verbalized understanding. Pt safe to DC home when medically stable. OT signing off.     Follow Up Recommendations  No OT follow up;Supervision - Intermittent    Equipment Recommendations  None recommended by OT    Recommendations for Other Services       Precautions / Restrictions Precautions Precautions: None Restrictions Weight Bearing Restrictions: No      Mobility Bed Mobility Overal bed mobility: Modified Independent                Transfers Overall transfer level: Independent   Balance Overall balance assessment: No apparent balance deficits (not formally assessed) Sitting-balance support: Feet supported Sitting balance-Leahy Scale: Good     Standing balance support: During functional activity;No upper extremity supported Standing balance-Leahy Scale: Fair                       ADL either performed or assessed with clinical judgement   ADL Overall ADL's : At baseline          Pt going to the bathroom, washing hands and completed activities independently                                   Vision Baseline Vision/History: Wears glasses Wears Glasses: Reading only Patient Visual Report: Other (comment) (seeing spots) Vision Assessment?: No apparent  visual deficits Additional Comments: Pt reports visual changes during "episode". "i was seeing spots"     Perception Perception Comments: WFL   Praxis Praxis Praxis tested?: Within functional limits    Pertinent Vitals/Pain Pain Assessment: No/denies pain     Hand Dominance Right   Extremity/Trunk Assessment Upper Extremity Assessment Upper Extremity Assessment: Overall WFL for tasks assessed   Lower Extremity Assessment Lower Extremity Assessment: Defer to PT evaluation   Cervical / Trunk Assessment Cervical / Trunk Assessment: Normal   Communication Communication Communication: No difficulties   Cognition Arousal/Alertness: Awake/alert Behavior During Therapy: WFL for tasks assessed/performed Overall Cognitive Status: Within Functional Limits for tasks assessed                                     General Comments       Exercises     Shoulder Instructions      Home Living Family/patient expects to be discharged to:: Private residence Living Arrangements: Spouse/significant other Available Help at Discharge: Family;Available 24 hours/day Type of Home: House Home Access: Stairs to enter [employer] of Steps: 7 Entrance Stairs-Rails: Right;Left;Can reach both Home Layout: One level;Other (Comment) (two steps between kitchen and the rest of the house)     Bathroom Shower/Tub: Tub/shower unit  Bathroom Toilet: Programmer, systems: Yes How Accessible: Accessible via walker Home Equipment: None          Prior Functioning/Environment Level of Independence: Independent        Comments: pt independent - she took her elderly mom shopping the day she was admitted; drives; active with her grandchildren        OT Problem List: Decreased strength;Decreased activity tolerance;Obesity      OT Treatment/Interventions:      OT Goals(Current goals can be found in the care plan section) Acute Rehab OT Goals Patient  Stated Goal: return home OT Goal Formulation: All assessment and education complete, DC therapy  OT Frequency:     Barriers to D/C:            Co-evaluation              End of Session Nurse Communication: Mobility status  Activity Tolerance: Patient tolerated treatment well Patient left: in bed;with call bell/phone within reach;with family/visitor present  OT Visit Diagnosis: Unsteadiness on feet (R26.81)                Time: 2841-3244 OT Time Calculation (min): 19 min Charges:  OT General Charges $OT Visit: 1 Procedure OT Evaluation $OT Eval Low Complexity: 1 Procedure G-Codes:     Slayden Mennenga, OT/L  [phone] 03/22/2017  Ahkeem Goede,HILLARY 03/22/2017, 3:35 PM

## 2017-03-22 NOTE — Discharge Summary (Signed)
Physician Discharge Summary  Yvonne [name] EVO:[id number] DOB: [birth year] DOA: 03/21/2017  PCP: [name], MD  Admit date: 03/21/2017 Discharge date: 03/22/2017  Admitted From:Home Disposition:home  Recommendations for Outpatient Follow-up:  1. Follow up with PCP in 1-2 weeks. Please follow-up A1c result with her PCP 2. Please obtain BMP/CBC in one week  Home Health: no Equipment/Devices:no Discharge Condition:stable CODE STATUS:full Diet recommendation:heart healthy  Brief/Interim Summary: 66 year old female with history of type 2 diabetes, hypertension, anxiety presented with acute onset of right-sided weakness involving the arm and leg. In the ER patient was found to have hypertensive. CT head read as acute pallor make infarct however as per urologist those are chronic finding. MRI with no acute distress. Patient's symptoms are likely TIA. Her symptoms improved significantly. CT angiogram of head and neck with no critical stenosis. Echocardiogram unremarkable. Patient was restarted on aspirin and a statin. Patient clinically improved. I reviewed with Dr.Xu from neurologist in detail. Recommended to continue current medication with outpatient follow-up with neurologist. Continued home medications. LDL 133, A1c pending. Given MRI showed no acute finding patient does not have acute stroke. Patient was recommended to follow-up with her neurologist.   Discharge Diagnoses:  Principal Problem:   Stroke (cerebrum) ([place]) Active Problems:   Hypertension   Anxiety   Type 2 diabetes mellitus (HCC)   Pain syndrome, chronic   Chronic diastolic CHF (congestive heart failure) [facility])    Discharge Instructions  Discharge Instructions    Call MD for:  difficulty breathing, headache or visual disturbances    Complete by:  As directed    Call MD for:  extreme fatigue    Complete by:  As directed    Call MD for:  hives    Complete by:  As directed    Call MD for:  persistant dizziness or  light-headedness    Complete by:  As directed    Call MD for:  persistant nausea and vomiting    Complete by:  As directed    Call MD for:  severe uncontrolled pain    Complete by:  As directed    Call MD for:  temperature >100.4    Complete by:  As directed    Diet - low sodium heart healthy    Complete by:  As directed    Increase activity slowly    Complete by:  As directed      Allergies as of 03/22/2017      Reactions   Penicillins Hives, Other (See Comments)   Has patient had a PCN reaction causing immediate rash, facial/tongue/throat swelling, SOB or lightheadedness with hypotension: Yes Has patient had a PCN reaction causing severe rash involving mucus membranes or skin necrosis: No Has patient had a PCN reaction that required hospitalization No Has patient had a PCN reaction occurring within the last 10 years: No If all of the above answers are "NO", then may proceed with Cephalosporin use.   Lisinopril Cough      Medication List    TAKE these medications   albuterol (2.5 MG/3ML) 0.083% nebulizer solution Commonly known as:  PROVENTIL Take 2.5 mg by nebulization every 6 (six) hours as needed for wheezing or shortness of breath.   VENTOLIN HFA 108 (90 Base) MCG/ACT inhaler Generic drug:  albuterol INHALE 2 PUFFS INTO THE LUNGS EVERY 6 (SIX) HOURS AS NEEDED FOR WHEEZING OR SHORTNESS OF BREATH.   ALPRAZolam 1 MG tablet Commonly known as:  XANAX Take 1 tablet (1 mg total) by mouth 3 (three)  times daily. TAKE (1) TABLET THREE TIMES DAILY. What changed:  additional instructions   amLODipine 5 MG tablet Commonly known as:  NORVASC TAKE 1 TABLET (5 MG TOTAL) BY MOUTH DAILY.   aspirin 325 MG tablet Take 1 tablet (325 mg total) by mouth daily. Start taking on:  03/23/2017   atorvastatin 20 MG tablet Commonly known as:  LIPITOR Take 1 tablet (20 mg total) by mouth daily at 6 PM.   BREO ELLIPTA 100-25 MCG/INH Aepb Generic drug:  fluticasone furoate-vilanterol INHALE  1 PUFF INTO THE LUNGS DAILY.   ezetimibe 10 MG tablet Commonly known as:  ZETIA Take 1 tablet (10 mg total) by mouth daily. For cholesterol What changed:  when to take this  additional instructions   furosemide 20 MG tablet Commonly known as:  LASIX TAKE 1 TABLET (20 MG TOTAL) BY MOUTH DAILY.   gabapentin 300 MG capsule Commonly known as:  NEURONTIN Take 600 mg by mouth 4 (four) times daily.   HYDROcodone-acetaminophen 10-325 MG tablet Commonly known as:  NORCO Take 1 tablet by mouth every 6 (six) hours as needed for moderate pain. Reported on 02/07/2016 What changed:  when to take this  additional instructions   metFORMIN 500 MG 24 hr tablet Commonly known as:  GLUCOPHAGE-XR TAKE 2 TABLETS (1,000 MG TOTAL) BY MOUTH DAILY WITH BREAKFAST. What changed:  how much to take   pantoprazole 40 MG tablet Commonly known as:  PROTONIX TAKE 1 TABLET (40 MG TOTAL) BY MOUTH 2 (TWO) TIMES DAILY. FOR STOMACH   VESICARE 10 MG tablet Generic drug:  solifenacin TAKE 1 TABLET (10 MG TOTAL) BY MOUTH DAILY.      Follow-up Information    [name],WARREN, MD. Schedule an appointment as soon as possible for a visit in 1 week(s).   Specialty:  Family Medicine Contact information: [place] [address] [place], [place]. Schedule an appointment as soon as possible for a visit in 2 week(s).   Specialty:  Neurology Contact information: [address] [place] [address] [phone]          Allergies  Allergen Reactions  . Penicillins Hives and Other (See Comments)    Has patient had a PCN reaction causing immediate rash, facial/tongue/throat swelling, SOB or lightheadedness with hypotension: Yes Has patient had a PCN reaction causing severe rash involving mucus membranes or skin necrosis: No Has patient had a PCN reaction that required hospitalization No Has patient had a PCN reaction occurring within the last 10 years:  No If all of the above answers are "NO", then may proceed with Cephalosporin use.   [name] Kitchen Lisinopril Cough    Consultations: Neurology  Procedures/Studies: MRI, CT scan, echo  Subjective: Patient was seen and examined at bedside. Reported doing well. Denied headache, dizziness, nausea, vomiting, chest pressure, shortness of breath.  Discharge Exam: Vitals:   03/22/17 0618 03/22/17 1342  BP: 138/67 (!) 153/73  Pulse: (!) 59 65  Resp: 20 18  Temp: 97.8 F (36.6 C) 98.5 F (36.9 C)   Vitals:   03/22/17 0500 03/22/17 0618 03/22/17 0843 03/22/17 1342  BP:  138/67  (!) 153/73  Pulse:  (!) 59  65  Resp:  20  18  Temp:  97.8 F (36.6 C)  98.5 F (36.9 C)  TempSrc:  Oral  Oral  SpO2:  98% 98% 95%  Weight: 84.3 kg (185 lb 13.6 oz)  Height:        General: Pt is alert, awake, not in acute distress Cardiovascular: RRR, S1/S2 +, no rubs, no gallops Respiratory: CTA bilaterally, no wheezing, no rhonchi Abdominal: Soft, NT, ND, bowel sounds + Extremities: no edema, no cyanosis    The results of significant diagnostics from this hospitalization (including imaging, microbiology, ancillary and laboratory) are listed below for reference.     Microbiology: No results found for this or any previous visit (from the past 240 hour(s)).   Labs: BNP (last 3 results) No results for input(s): BNP in the last 8760 hours. Basic Metabolic Panel:  Recent Labs Lab 03/21/17 1825 03/21/17 1835  NA 139 139  K 3.5 3.5  CL 105 104  CO2 25  --   GLUCOSE 133* 130*  BUN 15 19  CREATININE 0.98 1.00  CALCIUM 9.7  --    Liver Function Tests:  Recent Labs Lab 03/21/17 1825  AST 33  ALT 35  ALKPHOS 110  BILITOT 0.5  PROT 6.5  ALBUMIN 3.8   No results for input(s): LIPASE, AMYLASE in the last 168 hours. No results for input(s): AMMONIA in the last 168 hours. CBC:  Recent Labs Lab 03/21/17 1825 03/21/17 1835  WBC 6.7  --   NEUTROABS 3.2  --   HGB 13.2 12.9  HCT 39.5 38.0   MCV 84.6  --   PLT 228  --    Cardiac Enzymes: No results for input(s): CKTOTAL, CKMB, CKMBINDEX, TROPONINI in the last 168 hours. BNP: Invalid input(s): POCBNP CBG:  Recent Labs Lab 03/21/17 1902 03/21/17 2219 03/22/17 0615 03/22/17 1120  GLUCAP 114* 121* 155* 141*   D-Dimer No results for input(s): DDIMER in the last 72 hours. Hgb A1c No results for input(s): HGBA1C in the last 72 hours. Lipid Profile  Recent Labs  03/22/17 0433  CHOL 210*  HDL 40*  LDLCALC 133*  TRIG 185*  CHOLHDL 5.3   Thyroid function studies No results for input(s): TSH, T4TOTAL, T3FREE, THYROIDAB in the last 72 hours.  Invalid input(s): FREET3 Anemia work up No results for input(s): VITAMINB12, FOLATE, FERRITIN, TIBC, IRON, RETICCTPCT in the last 72 hours. Urinalysis    Component Value Date/Time   COLORURINE YELLOW 09/28/2016 2019   APPEARANCEUR CLEAR 09/28/2016 2019   APPEARANCEUR Clear 09/14/2016 1158   LABSPEC <1.005 (L) 09/28/2016 2019   PHURINE 6.5 09/28/2016 2019   GLUCOSEU NEGATIVE 09/28/2016 2019   HGBUR TRACE (A) 09/28/2016 2019   BILIRUBINUR NEGATIVE 09/28/2016 2019   BILIRUBINUR Negative 09/14/2016 [place] 09/28/2016 2019   PROTEINUR NEGATIVE 09/28/2016 2019   NITRITE NEGATIVE 09/28/2016 2019   LEUKOCYTESUR SMALL (A) 09/28/2016 2019   LEUKOCYTESUR 1+ (A) 09/14/2016 1158   Sepsis Labs Invalid input(s): PROCALCITONIN,  WBC,  LACTICIDVEN Microbiology No results found for this or any previous visit (from the past 240 hour(s)).   Time coordinating discharge: 28 minutes  SIGNED:   Rosita Fire, MD  [facility] 03/22/2017, 4:06 PM  If 7PM-7AM, please contact night-coverage [email or web address] Password [email or web address]

## 2017-03-22 NOTE — Evaluation (Signed)
Physical Therapy Evaluation Patient Details Name: Yvonne [name] MRN: [id number] DOB: 1951/11/03 Today's Date: 03/22/2017   History of Present Illness  Pt is a 66 y/o female presenting with R UE and LE weakness. 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 PMH including but not limited to DM, HTN and hx of CVA.  Clinical Impression  Pt presented supine in bed with HOB elevated, awake and willing to participate in therapy session. Prior to admission, pt reported that she was independent with all functional mobility and ADLs. Pt assists with caring for her grandchildren. Pt ambulated in hallway and able to participate in higher level balance assessment (see below). No further acute PT needs identified at this time. PT signing off.     Follow Up Recommendations No PT follow up    Equipment Recommendations  None recommended by PT    Recommendations for Other Services       Precautions / Restrictions Precautions Precautions: None Restrictions Weight Bearing Restrictions: No      Mobility  Bed Mobility Overal bed mobility: Modified Independent                Transfers Overall transfer level: Needs assistance Equipment used: None Transfers: Sit to/from Stand Sit to Stand: Supervision         General transfer comment: increased time, supervision for safety  Ambulation/Gait Ambulation/Gait assistance: Supervision Ambulation Distance (Feet): 300 Feet Assistive device: None Gait Pattern/deviations: Step-through pattern Gait velocity: WNL Gait velocity interpretation: at or above normal speed for age/gender General Gait Details: no instability or LOB, no need for physical assistance  Stairs Stairs: Yes Stairs assistance: Min guard Stair Management: Two rails;Alternating pattern;Step to pattern;Forwards Number of Stairs: 2 General stair comments: no instability or need for physical assistance  Wheelchair Mobility    Modified Rankin (Stroke Patients  Only)       Balance Overall balance assessment: Needs assistance Sitting-balance support: Feet supported Sitting balance-Leahy Scale: Good     Standing balance support: During functional activity;No upper extremity supported Standing balance-Leahy Scale: Fair                   Standardized Balance Assessment Standardized Balance Assessment : Dynamic Gait Index   Dynamic Gait Index Level Surface: Normal Change in Gait Speed: Normal Gait with Horizontal Head Turns: Normal Gait with Vertical Head Turns: Normal Gait and Pivot Turn: Mild Impairment Step Over Obstacle: Normal Step Around Obstacles: Normal Steps: Mild Impairment Total Score: 22       Pertinent Vitals/Pain Pain Assessment: No/denies pain    Home Living Family/patient expects to be discharged to:: Private residence Living Arrangements: Spouse/significant other Available Help at Discharge: Family;Available 24 hours/day Type of Home: House Home Access: Stairs to enter Entrance Stairs-Rails: Right;Left;Can reach both Entrance Stairs-Number of Steps: 7 Home Layout: One level;Other (Comment) (two steps between kitchen and the rest of the house) Home Equipment: None      Prior Function Level of Independence: Independent               Hand Dominance   Dominant Hand: Right    Extremity/Trunk Assessment   Upper Extremity Assessment Upper Extremity Assessment: Generalized weakness;Defer to OT evaluation    Lower Extremity Assessment Lower Extremity Assessment: Overall WFL for tasks assessed    Cervical / Trunk Assessment Cervical / Trunk Assessment: Normal  Communication   Communication: No difficulties  Cognition Arousal/Alertness: Awake/alert Behavior During Therapy: WFL for tasks assessed/performed Overall Cognitive Status: Within Functional Limits  for tasks assessed                                        General Comments      Exercises     Assessment/Plan     PT Assessment Patent does not need any further PT services  PT Problem List         PT Treatment Interventions      PT Goals (Current goals can be found in the Care Plan section)  Acute Rehab PT Goals Patient Stated Goal: return home    Frequency     Barriers to discharge        Co-evaluation               End of Session Equipment Utilized During Treatment: Gait belt Activity Tolerance: Patient tolerated treatment well Patient left: in bed;with call bell/phone within reach;with family/visitor present Nurse Communication: Mobility status PT Visit Diagnosis: Other abnormalities of gait and mobility (R26.89);Other symptoms and signs involving the nervous system (R29.898)    Time: 4643-1427 PT Time Calculation (min) (ACUTE ONLY): 16 min   Charges:   PT Evaluation $PT Eval Moderate Complexity: 1 Procedure     PT G Codes:        Sherie Don, PT, DPT [place] 03/22/2017, 3:08 PM

## 2017-03-22 NOTE — Care Management Note (Signed)
Case Management Note  Patient Details  Name: Yvonne [name] MRN: [id number] Date of Birth: [birth year]  Subjective/Objective:                    Action/Plan: Pt discharging home with self care. Pt has insurance and PCP. No f/u per PT. No further needs per CM.  Expected Discharge Date:  03/22/17               Expected Discharge Plan:  Home/Self Care  In-House Referral:     Discharge planning Services     Post Acute Care Choice:    Choice offered to:     DME Arranged:    DME Agency:     HH Arranged:    HH Agency:     Status of Service:  Completed, signed off  If discussed at [employer] of Stay Meetings, dates discussed:    Additional Comments:  Pollie Friar, RN 03/22/2017, 3:23 PM

## 2017-03-22 NOTE — Progress Notes (Signed)
Patient doesn't like needles, so is refusing insulin and lovenox shots.

## 2017-03-22 NOTE — Progress Notes (Signed)
STROKE TEAM PROGRESS NOTE   HISTORY OF PRESENT ILLNESS (per record) Yvonne [name] is a 66 y.o. female the history of previous stroke who had recovered from her previous deficits who was in her normal state of health until around 3:30 PM. At that time she noticed that her right side was not doing like it normally should. She noticed both weakness and numbness throughout her right side.  This persisted and her family finally convinced her to come into the emergency department. 911 was called, due to mild deficit she was not activated as a code stroke.  On arrival, she still had right-sided drift which improved during her stay in the ER.  LKW: 3:30 PM tpa given?: no, rapidly improving mild symptoms   SUBJECTIVE (INTERVAL HISTORY) Her husband is at the bedside. Pt now back to baseline.  Pt came in again yesterday for right sided numbness weakness and right facial tingling. Pt stated that two days ago she had bad HA and yesterday she had mild HA. She had similar symptoms 09/2016 with right hand and foot tingling, associated with HA.     OBJECTIVE Temp:  [97.5 F (36.4 C)-98.6 F (37 C)] 97.8 F (36.6 C) (04/27 0618) Pulse Rate:  [59-77] 59 (04/27 0618) Cardiac Rhythm: Sinus bradycardia (04/27 0752) Resp:  [16-26] 20 (04/27 0618) BP: (138-189)/(59-123) 138/67 (04/27 0618) SpO2:  [94 %-100 %] 98 % (04/27 0843) Weight:  [81.2 kg (179 lb)-84.3 kg (185 lb 13.6 oz)] 84.3 kg (185 lb 13.6 oz) (04/27 0500)  CBC:   Recent Labs Lab 03/21/17 1825 03/21/17 1835  WBC 6.7  --   NEUTROABS 3.2  --   HGB 13.2 12.9  HCT 39.5 38.0  MCV 84.6  --   PLT 228  --     Basic Metabolic Panel:   Recent Labs Lab 03/21/17 1825 03/21/17 1835  NA 139 139  K 3.5 3.5  CL 105 104  CO2 25  --   GLUCOSE 133* 130*  BUN 15 19  CREATININE 0.98 1.00  CALCIUM 9.7  --     Lipid Panel:     Component Value Date/Time   CHOL 210 (H) 03/22/2017 0433   CHOL 183 05/03/2016 0807   TRIG 185 (H) 03/22/2017  0433   HDL 40 (L) 03/22/2017 0433   HDL 52 05/03/2016 0807   CHOLHDL 5.3 03/22/2017 0433   VLDL 37 03/22/2017 0433   LDLCALC 133 (H) 03/22/2017 0433   LDLCALC 109 (H) 05/03/2016 0807   HgbA1c:  Lab Results  Component Value Date   HGBA1C 6.9 (H) 09/29/2016   Urine Drug Screen:     Component Value Date/Time   LABOPIA NONE DETECTED 09/28/2016 2019   COCAINSCRNUR NONE DETECTED 09/28/2016 2019   LABBENZ NONE DETECTED 09/28/2016 2019   AMPHETMU NONE DETECTED 09/28/2016 2019   THCU NONE DETECTED 09/28/2016 2019   LABBARB NONE DETECTED 09/28/2016 2019    Alcohol Level     Component Value Date/Time   ETH 5 (H) 09/28/2016 1645    IMAGING I have personally reviewed the radiological images below and agree with the radiology interpretations.  Ct Angio Head W Or Wo Contrast Ct Angio Neck W Or Wo Contrast 03/21/2017 CTA NECK:  No hemodynamically significant stenosis or acute vascular process. Mild atherosclerosis.  CTA HEAD:   No emergent large vessel occlusion or severe stenosis. Mild atherosclerosis.    Mr Brain Wo Contrast 03/21/2017 Normal MRI of the brain for age.     Ct Head Code Stroke  W/o Cm  03/21/2017 1. New, possibly acute LEFT thalamus lacunar infarct.  2. ASPECTS is 10.   TTE - Left ventricle: The cavity size was normal. Wall thickness was   increased in a pattern of mild LVH. Systolic function was normal.   The estimated ejection fraction was in the range of 60% to 65%.   Wall motion was normal; there were no regional wall motion   abnormalities. Doppler parameters are consistent with abnormal   left ventricular relaxation (grade 1 diastolic dysfunction).  PHYSICAL EXAM  Temp:  [97.5 F (36.4 C)-98.6 F (37 C)] 98.5 F (36.9 C) (04/27 1342) Pulse Rate:  [59-77] 65 (04/27 1342) Resp:  [16-26] 18 (04/27 1342) BP: (138-189)/(59-123) 153/73 (04/27 1342) SpO2:  [94 %-100 %] 95 % (04/27 1342) Weight:  [179 lb (81.2 kg)-185 lb 13.6 oz (84.3 kg)] 185 lb 13.6 oz  (84.3 kg) (04/27 0500)  General - Well nourished, well developed, in no apparent distress.  Ophthalmologic - Fundi not visualized due to noncooperation.  Cardiovascular - Regular rate and rhythm.  Mental Status -  Level of arousal and orientation to time, place, and person were intact. Language including expression, naming, repetition, comprehension was assessed and found intact. Fund of Knowledge was assessed and was intact.  Cranial Nerves II - XII - II - Visual field intact OU. III, IV, VI - Extraocular movements intact. V - Facial sensation intact bilaterally. VII - Facial movement intact bilaterally. VIII - Hearing & vestibular intact bilaterally. X - Palate elevates symmetrically. XI - Chin turning & shoulder shrug intact bilaterally. XII - Tongue protrusion intact.  Motor Strength - The patient's strength was normal in all extremities and pronator drift was absent.  Bulk was normal and fasciculations were absent.   Motor Tone - Muscle tone was assessed at the neck and appendages and was normal.  Reflexes - The patient's reflexes were 1+ in all extremities and she had no pathological reflexes.  Sensory - Light touch, temperature/pinprick were assessed and were symmetrical.    Coordination - The patient had normal movements in the hands and feet with no ataxia or dysmetria.  Tremor was absent.  Gait and Station - deferred.   ASSESSMENT/PLAN [name] is a 66 y.o. female with history of previous stroke, hypertension, hyperlipidemia, diabetes mellitus, chronic headaches, anxiety, and asthma presenting with weakness and numbness throughout her right side. She did not receive IV t-PA due to improving deficits.  Possible TIA vs. Complicated migraine - pt did have HA before symptoms and last time symptoms also associated with HA. However, she indeed had small chronic left thalamic lacune on MRI this time indicating her last admission 09/2016 likely due to a small stroke  although MRI at that time was negative. Of course, migraine is one of the stroke risk factor.  Resultant  Symptoms resolved  MRI  - Normal MRI of the brain for age.   MRA - not performed  CTA H&N - unremarkable  2D Echo - unremarkable  LDL - 133  HgbA1c - pending  VTE prophylaxis - Lovenox Diet Carb Modified Fluid consistency: Thin; Room service appropriate? Yes  ASA 81mg  prior to admission, now on aspirin 325 mg daily. Continue ASA on discharge.  Patient counseled to be compliant with her antithrombotic medications  Ongoing aggressive stroke risk factor management  Therapy recommendations: pending  Disposition: Pending  Hx of stroke  09/2016 pt admitted for right hand and foot tingling  MRI negative at that time, LDL 163  and A1C 6.9  However, this time MRI showed chronic left small thalamic lacune  On ASA 81mg  on discharge.  Hypertension  Stable  Permissive hypertension (OK if < 220/120) but gradually normalize in 5-7 days  Long-term BP goal normotensive  Hyperlipidemia  Home meds: Zetia 10 mg daily resumed in hospital  LDL 133, goal < 70  Add Lipitor 20 mg daily   Continue statin at discharge  Pre-Diabetes  HgbA1c pending, goal < 7.0  A1C 09/2016 6.9  Need close follow up with PCP  Other Stroke Risk Factors  Obesity, Body mass index is 33.99 kg/m., recommend weight loss, diet and exercise as appropriate   Family hx stroke (mother)  s/s of OSA - need outpt sleep study  Other [facility] day # 1  Neurology will sign off. Please call with questions. Pt will follow up with Dr. Tomi Likens at [facility] on 04/23/17. Thanks for the consult.  Rosalin Hawking, MD PhD Stroke Neurology 03/22/2017 4:27 PM   To contact Stroke Continuity provider, please refer to [email or web address] After hours, contact General Neurology

## 2017-03-23 LAB — HEMOGLOBIN A1C
HEMOGLOBIN A1C: 7.3 % — AB (ref 4.8–5.6)
Mean Plasma Glucose: 163 mg/dL

## 2017-03-26 ENCOUNTER — Other Ambulatory Visit: Payer: Self-pay | Admitting: Family Medicine

## 2017-03-26 ENCOUNTER — Encounter: Payer: Self-pay | Admitting: Family Medicine

## 2017-03-26 ENCOUNTER — Ambulatory Visit (INDEPENDENT_AMBULATORY_CARE_PROVIDER_SITE_OTHER): Payer: Medicare HMO | Admitting: Family Medicine

## 2017-03-26 VITALS — BP 127/74 | HR 82 | Temp 97.3°F | Ht 62.0 in | Wt 183.0 lb

## 2017-03-26 DIAGNOSIS — E119 Type 2 diabetes mellitus without complications: Secondary | ICD-10-CM

## 2017-03-26 DIAGNOSIS — I10 Essential (primary) hypertension: Secondary | ICD-10-CM | POA: Diagnosis not present

## 2017-03-26 DIAGNOSIS — E785 Hyperlipidemia, unspecified: Secondary | ICD-10-CM | POA: Diagnosis not present

## 2017-03-26 DIAGNOSIS — G459 Transient cerebral ischemic attack, unspecified: Secondary | ICD-10-CM

## 2017-03-26 MED ORDER — ALPRAZOLAM 1 MG PO TABS: 1.0000 mg | ORAL_TABLET | Freq: Three times a day (TID) | ORAL | 1 refills | Status: DC

## 2017-03-26 MED ORDER — METFORMIN HCL ER 750 MG PO TB24: 750.0000 mg | ORAL_TABLET | Freq: Every day | ORAL | 2 refills | Status: DC

## 2017-03-26 MED ORDER — RIVAROXABAN (XARELTO) VTE STARTER PACK (15 & 20 MG)
ORAL_TABLET | ORAL | 0 refills | Status: DC
Start: 2017-03-26 — End: 2017-04-23

## 2017-03-26 NOTE — Progress Notes (Signed)
Subjective:  Patient ID: Yvonne [name], female    DOB: 1951-06-26  Age: 66 y.o. MRN: [id number]  CC: Hospitalization Follow-up (pt here today following up after going to ED for TIA)   HPI Yvonne [name] presents for Follow-up of hospitalization of April 27 through April 28. Patient went in to the emergency room at [facility] for right sided hemiparesis. While she was driving to pick up her granddaughter from school she felt her right leg go numb so that she could not feel herself pushing on the gas pedal. She immediately pulled to the side of the road and called for help. At that time she was transported to the emergency department. She did not receive TPA or similar intervention.   She received MRI and CT scan. The CT scan originally showed evidence for a lacunar infarct in the hypothalamus. Follow-up MRI was negative. Patient's symptoms resolved. She was seen by neurology and risk factor management was recommended. Additionally was she was told to take 325 mg aspirin every day. At this point she is here for optimal control of blood pressure cholesterol and diabetes. She has recently been placed back on atorvastatin. She continues to Zetia. Both to maximize her cholesterol. She's only been on the atorvastatin for a few days ago. Regarding diabetes She has been taking her metformin 500 mg once daily. Her A1c while hospitalized was 7.3. She has not been checking blood sugar regularly recently. She is overweight and requests assistance with a weight loss program. History Yvonne [name] has a past medical history of Anxiety; Asthma; Cataract; Chronic bronchitis ([place]); Chronic headaches; Chronic leg pain; Clavicle fracture; Diabetes mellitus without complication (HCC); H/O seasonal allergies; Hyperlipidemia; Hypertension; Overactive bladder; and Stroke ([place]).   She has a past surgical history that includes Abdominal hysterectomy; Cholecystectomy; tear duct surgery (Bilateral); Colonoscopy; Colonoscopy (N/A,  08/17/2015); and Cataract extraction.   Her family history includes Arthritis in her brother and mother; Asthma in her father; CVA in her mother; Cancer in her brother; Diabetes in her father and mother; Heart disease in her father; Hepatitis C in her sister; Stroke in her mother.She reports that she has never smoked. She has never used smokeless tobacco. She reports that she does not drink alcohol or use drugs.    ROS Review of Systems  Constitutional: Negative for activity change, appetite change and fever.  HENT: Negative for congestion, rhinorrhea and sore throat.   Eyes: Negative for visual disturbance.  Respiratory: Negative for cough and shortness of breath.   Cardiovascular: Negative for chest pain and palpitations.  Gastrointestinal: Negative for abdominal pain, diarrhea and nausea.  Genitourinary: Negative for dysuria.  Musculoskeletal: Negative for arthralgias and myalgias.    Objective:  BP 127/74   Pulse 82   Temp 97.3 F (36.3 C) (Oral)   Ht 5\' 2"  (1.575 m)   Wt 183 lb (83 kg)   BMI 33.47 kg/m   BP Readings from Last 3 Encounters:  03/26/17 127/74  03/22/17 (!) 168/68  01/16/17 132/80    Wt Readings from Last 3 Encounters:  03/26/17 183 lb (83 kg)  03/22/17 185 lb 13.6 oz (84.3 kg)  01/16/17 188 lb (85.3 kg)     Physical Exam  Constitutional: She is oriented to person, place, and time. She appears well-developed and well-nourished. No distress.  HENT:  Head: Normocephalic and atraumatic.  Right Ear: External ear normal.  Left Ear: External ear normal.  Nose: Nose normal.  Mouth/Throat: Oropharynx is clear and moist.  Eyes: Conjunctivae  and EOM are normal. Pupils are equal, round, and reactive to light.  Neck: Normal range of motion. Neck supple. No thyromegaly present.  Cardiovascular: Normal rate, regular rhythm and normal heart sounds.   No murmur heard. Pulmonary/Chest: Effort normal and breath sounds normal. No respiratory distress. She has no  wheezes. She has no rales.  Abdominal: Soft. Bowel sounds are normal. She exhibits no distension. There is no tenderness.  Lymphadenopathy:    She has no cervical adenopathy.  Neurological: She is alert and oriented to person, place, and time. She has normal reflexes.  Skin: Skin is warm and dry.  Psychiatric: She has a normal mood and affect. Her behavior is normal. Judgment and thought content normal.      Assessment & Plan:   Yvonne [name] was seen today for hospitalization follow-up.  Diagnoses and all orders for this visit:  Type 2 diabetes mellitus without complication, without long-term current use of insulin (HCC) -     metFORMIN (GLUCOPHAGE-XR) 750 MG 24 hr tablet; Take 1 tablet (750 mg total) by mouth daily with breakfast.  Essential hypertension  Hyperlipidemia with target LDL less than 70  Transient cerebral ischemia, unspecified type -     Rivaroxaban 15 & 20 MG TBPK; Take as directed on package: Start with one 15mg  tablet by mouth twice a day with food. On Day 22, switch to one 20mg  tablet once a day with food.  Other orders -     ALPRAZolam (XANAX) 1 MG tablet; Take 1 tablet (1 mg total) by mouth 3 (three) times daily.     Consultation with diabetes educator Yvonne [name] ordered. Begin with 10% weight loss approximately 20 pounds. Due to her TIA an aggressive A1c approach should be helpful with goal of 6.5. I like to see her blood pressure below 120/80. And her LDL cholesterol below 70. Steps have been taken for each of these today.  Hospital records reviewed regarding her recent stay, H&P showing evidence for stroke, MRI showing no evidence for stroke, CT showing lacunar infarct in the thalamus and discharge summary relating resolution of symptoms as well as the A1c equal 7.3.  I have changed Yvonne [name]'s ALPRAZolam and metFORMIN. I am also having her start on Rivaroxaban. Additionally, I am having her maintain her HYDROcodone-acetaminophen, ezetimibe, amLODipine,  furosemide, pantoprazole, gabapentin, albuterol, VENTOLIN HFA, VESICARE, BREO ELLIPTA, aspirin, and atorvastatin.  Allergies as of 03/26/2017      Reactions   Penicillins Hives, Other (See Comments)   Has patient had a PCN reaction causing immediate rash, facial/tongue/throat swelling, SOB or lightheadedness with hypotension: Yes Has patient had a PCN reaction causing severe rash involving mucus membranes or skin necrosis: No Has patient had a PCN reaction that required hospitalization No Has patient had a PCN reaction occurring within the last 10 years: No If all of the above answers are "NO", then may proceed with Cephalosporin use.   Lisinopril Cough      Medication List       Accurate as of 03/26/17  3:05 PM. Always use your most recent med list.          albuterol (2.5 MG/3ML) 0.083% nebulizer solution Commonly known as:  PROVENTIL Take 2.5 mg by nebulization every 6 (six) hours as needed for wheezing or shortness of breath.   VENTOLIN HFA 108 (90 Base) MCG/ACT inhaler Generic drug:  albuterol INHALE 2 PUFFS INTO THE LUNGS EVERY 6 (SIX) HOURS AS NEEDED FOR WHEEZING OR SHORTNESS OF BREATH.   ALPRAZolam 1 MG  tablet Commonly known as:  XANAX Take 1 tablet (1 mg total) by mouth 3 (three) times daily.   amLODipine 5 MG tablet Commonly known as:  NORVASC TAKE 1 TABLET (5 MG TOTAL) BY MOUTH DAILY.   aspirin 325 MG tablet Take 1 tablet (325 mg total) by mouth daily.   atorvastatin 20 MG tablet Commonly known as:  LIPITOR Take 1 tablet (20 mg total) by mouth daily at 6 PM.   BREO ELLIPTA 100-25 MCG/INH Aepb Generic drug:  fluticasone furoate-vilanterol INHALE 1 PUFF INTO THE LUNGS DAILY.   ezetimibe 10 MG tablet Commonly known as:  ZETIA Take 1 tablet (10 mg total) by mouth daily. For cholesterol   furosemide 20 MG tablet Commonly known as:  LASIX TAKE 1 TABLET (20 MG TOTAL) BY MOUTH DAILY.   gabapentin 300 MG capsule Commonly known as:  NEURONTIN Take 600 mg by  mouth 4 (four) times daily.   HYDROcodone-acetaminophen 10-325 MG tablet Commonly known as:  NORCO Take 1 tablet by mouth every 6 (six) hours as needed for moderate pain. Reported on 02/07/2016   metFORMIN 750 MG 24 hr tablet Commonly known as:  GLUCOPHAGE-XR Take 1 tablet (750 mg total) by mouth daily with breakfast.   pantoprazole 40 MG tablet Commonly known as:  PROTONIX TAKE 1 TABLET (40 MG TOTAL) BY MOUTH 2 (TWO) TIMES DAILY. FOR STOMACH   Rivaroxaban 15 & 20 MG Tbpk Take as directed on package: Start with one 15mg  tablet by mouth twice a day with food. On Day 22, switch to one 20mg  tablet once a day with food.   VESICARE 10 MG tablet Generic drug:  solifenacin TAKE 1 TABLET (10 MG TOTAL) BY MOUTH DAILY.      Review of clinical test, review of radiology test and review and summarization of old records performed. Exam today of new problem with additional workup planned performed. High risk level due to exacerbation of life-threatening illness - stroke representing a abrupt change neurologic status. This being the recent CVA/TIA. Additionally review of February stable problems in the form of diabetes hypertension and cholesterol. Metformin increased in order to maximize diabetes control. She should be monitoring her sugar twice daily as well.  Follow-up: Return in about 6 weeks (around 05/07/2017).  [name], M.D.

## 2017-03-26 NOTE — Addendum Note (Signed)
Addended by: [name] on: 03/26/2017 03:15 PM   Modules accepted: Level of Service

## 2017-04-03 ENCOUNTER — Ambulatory Visit: Payer: Medicare HMO | Admitting: Family Medicine

## 2017-04-03 ENCOUNTER — Telehealth: Payer: Self-pay | Admitting: Family Medicine

## 2017-04-03 NOTE — Telephone Encounter (Signed)
What symptoms do you have? uti. Burning, urgency  How long have you been sick? today  Have you been seen for this problem? yes  If your provider decides to give you a prescription, which pharmacy would you like for it to be sent to? [facility]   Patient informed that this information will be sent to the clinical staff for review and that they should receive a follow up call.

## 2017-04-03 NOTE — Telephone Encounter (Signed)
appt scheduled Pt notified 

## 2017-04-09 DIAGNOSIS — M5442 Lumbago with sciatica, left side: Secondary | ICD-10-CM | POA: Diagnosis not present

## 2017-04-09 DIAGNOSIS — Z79899 Other long term (current) drug therapy: Secondary | ICD-10-CM | POA: Diagnosis not present

## 2017-04-09 DIAGNOSIS — M47816 Spondylosis without myelopathy or radiculopathy, lumbar region: Secondary | ICD-10-CM | POA: Diagnosis not present

## 2017-04-09 DIAGNOSIS — G894 Chronic pain syndrome: Secondary | ICD-10-CM | POA: Diagnosis not present

## 2017-04-09 DIAGNOSIS — M5441 Lumbago with sciatica, right side: Secondary | ICD-10-CM | POA: Diagnosis not present

## 2017-04-09 DIAGNOSIS — Z5181 Encounter for therapeutic drug level monitoring: Secondary | ICD-10-CM | POA: Diagnosis not present

## 2017-04-09 DIAGNOSIS — Z5189 Encounter for other specified aftercare: Secondary | ICD-10-CM | POA: Diagnosis not present

## 2017-04-15 DIAGNOSIS — N952 Postmenopausal atrophic vaginitis: Secondary | ICD-10-CM | POA: Diagnosis not present

## 2017-04-15 DIAGNOSIS — N941 Unspecified dyspareunia: Secondary | ICD-10-CM | POA: Diagnosis not present

## 2017-04-15 DIAGNOSIS — R350 Frequency of micturition: Secondary | ICD-10-CM | POA: Diagnosis not present

## 2017-04-15 DIAGNOSIS — N393 Stress incontinence (female) (male): Secondary | ICD-10-CM | POA: Diagnosis not present

## 2017-04-16 ENCOUNTER — Ambulatory Visit: Payer: Medicare HMO | Admitting: Pharmacist

## 2017-04-18 ENCOUNTER — Encounter: Payer: Self-pay | Admitting: Family Medicine

## 2017-04-20 ENCOUNTER — Other Ambulatory Visit: Payer: Self-pay | Admitting: Family Medicine

## 2017-04-23 ENCOUNTER — Encounter: Payer: Self-pay | Admitting: Neurology

## 2017-04-23 ENCOUNTER — Ambulatory Visit (INDEPENDENT_AMBULATORY_CARE_PROVIDER_SITE_OTHER): Payer: Medicare HMO | Admitting: Neurology

## 2017-04-23 VITALS — BP 128/84 | HR 68 | Ht 62.0 in | Wt 184.8 lb

## 2017-04-23 DIAGNOSIS — Z794 Long term (current) use of insulin: Secondary | ICD-10-CM

## 2017-04-23 DIAGNOSIS — E785 Hyperlipidemia, unspecified: Secondary | ICD-10-CM

## 2017-04-23 DIAGNOSIS — G451 Carotid artery syndrome (hemispheric): Secondary | ICD-10-CM

## 2017-04-23 DIAGNOSIS — I10 Essential (primary) hypertension: Secondary | ICD-10-CM | POA: Diagnosis not present

## 2017-04-23 DIAGNOSIS — E119 Type 2 diabetes mellitus without complications: Secondary | ICD-10-CM | POA: Diagnosis not present

## 2017-04-23 NOTE — Patient Instructions (Signed)
1.  Continue aspirin 325mg  daily 2.  Continue cholesterol medication 3.  Try to work on improving diabetes 4.  A heart healthy diet is important.  We will refer you to a nutritionist. 5.  Follow up in 6 months.
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
## 2017-04-24 ENCOUNTER — Encounter: Payer: Self-pay | Admitting: Family Medicine

## 2017-04-24 ENCOUNTER — Ambulatory Visit (INDEPENDENT_AMBULATORY_CARE_PROVIDER_SITE_OTHER): Payer: Medicare HMO | Admitting: Family Medicine

## 2017-04-24 ENCOUNTER — Ambulatory Visit: Payer: Medicare HMO | Admitting: Family Medicine

## 2017-04-24 ENCOUNTER — Ambulatory Visit (INDEPENDENT_AMBULATORY_CARE_PROVIDER_SITE_OTHER): Payer: Medicare HMO

## 2017-04-24 ENCOUNTER — Institutional Professional Consult (permissible substitution): Payer: Medicare HMO | Admitting: Neurology

## 2017-04-24 ENCOUNTER — Other Ambulatory Visit: Payer: Self-pay | Admitting: *Deleted

## 2017-04-24 VITALS — Temp 98.9°F | Ht 62.0 in | Wt 184.0 lb

## 2017-04-24 DIAGNOSIS — J40 Bronchitis, not specified as acute or chronic: Secondary | ICD-10-CM

## 2017-04-24 DIAGNOSIS — J329 Chronic sinusitis, unspecified: Secondary | ICD-10-CM

## 2017-04-24 MED ORDER — HYDROCODONE-HOMATROPINE 5-1.5 MG/5ML PO SYRP: 5.0000 mL | ORAL_SOLUTION | Freq: Four times a day (QID) | ORAL | 0 refills | Status: DC | PRN

## 2017-04-24 MED ORDER — LEVOFLOXACIN 500 MG PO TABS: 500.0000 mg | ORAL_TABLET | Freq: Every day | ORAL | 0 refills | Status: DC

## 2017-04-24 NOTE — Progress Notes (Signed)
Chief Complaint  Patient presents with  . Cough    HPI  Patient presents today forA new problem: Patient presents with upper respiratory congestion. Rhinorrhea that is frequently purulent. There is moderate sore throat. Patient reports coughing frequently as well.  yellow sputum noted. There is no fever, chills or sweats. The patient reports being short of breath. She has to prop up in bed to breathe on about 2 pillows. She is not having any edema. Onset was 3 days ago. Gradually worsening. She says her ears hurt her throat hurts her head hurts and her chest hurts. Denies precordial component PMH: Smoking status noted ROS: Per HPI  Objective: Temp 98.9 F (37.2 C) (Oral)   Ht 5\' 2"  (1.575 m)   Wt 184 lb (83.5 kg)   SpO2 99%   BMI 33.65 kg/m  Gen: NAD, alert, cooperative with exam HEENT: NCAT, EOMI, PERRL, Nares passages swollen and erythematous with exudate. Sinuses tender to percussion over the cheeks. CV: RRR, good S1/S2, no murmur Resp: CTABL, no wheezes, Moderate bronchoalveolar changes without rales. Abd: SNTND, BS present, no guarding or organomegaly Ext: No edema, warm Neuro: Alert and oriented, No gross deficits  Assessment and plan:  1. Sinobronchitis     Meds ordered this encounter  Medications  . levofloxacin (LEVAQUIN) 500 MG tablet    Sig: Take 1 tablet (500 mg total) by mouth daily. For 10 days    Dispense:  10 tablet    Refill:  0  . HYDROcodone-homatropine (HYCODAN) 5-1.5 MG/5ML syrup    Sig: Take 5 mLs by mouth every 6 (six) hours as needed for cough.    Dispense:  120 mL    Refill:  0    Orders Placed This Encounter  Procedures  . DG Chest 2 View    Standing Status:   Future    Number of Occurrences:   1    Standing Expiration Date:   06/24/2018    Order Specific Question:   Reason for Exam (SYMPTOM  OR DIAGNOSIS REQUIRED)    Answer:   cough X 3d    Order Specific Question:   Preferred imaging location?    Answer:   Internal    Follow up as  needed.  [name], MD

## 2017-04-24 NOTE — Patient Outreach (Signed)
[place] [facility]) Care Management  04/24/2017  Yvonne [name] [birth year] [id number]  Referral from [facility] for request for care manager:  Telephone call to patient who was advised of [facility] care management services.  HIPPA verification received  from patient.   Patient agreed to intake/screening assessment.  Patient voices that she has history of 2 strokes (2017, 2018), DM, high cholesterol, HTN, Chronic bronchitis & Asthma.   Voices that she has primary care provider (Dr. Bary Castilla neurologist-[place]. States she drives & takes herself to MD appointments. States she uses mail order for prepackaged medications.   Patient states her main concern is that she wants to get diabetes & cholesterol levels under control. States she has not been checking blood sugar & plans to get glucometer. States she has scheduled  appointment with nutritionist 6/14. (noted in labs that last A1c was 7.3 (02/2017).  Patient consents to Health Coach services.  Plan: Refer to care management assistant to refer to [facility] for Disease Management.  Sherrin Daisy, RN BSN [place] Management Coordinator [facility] Care Management  [phone]

## 2017-05-02 ENCOUNTER — Other Ambulatory Visit: Payer: Medicare HMO

## 2017-05-02 DIAGNOSIS — I10 Essential (primary) hypertension: Secondary | ICD-10-CM

## 2017-05-02 DIAGNOSIS — E785 Hyperlipidemia, unspecified: Secondary | ICD-10-CM

## 2017-05-02 DIAGNOSIS — E119 Type 2 diabetes mellitus without complications: Secondary | ICD-10-CM

## 2017-05-03 ENCOUNTER — Other Ambulatory Visit: Payer: Self-pay | Admitting: Family Medicine

## 2017-05-03 DIAGNOSIS — I10 Essential (primary) hypertension: Secondary | ICD-10-CM | POA: Diagnosis not present

## 2017-05-03 DIAGNOSIS — E119 Type 2 diabetes mellitus without complications: Secondary | ICD-10-CM

## 2017-05-03 DIAGNOSIS — E785 Hyperlipidemia, unspecified: Secondary | ICD-10-CM | POA: Diagnosis not present

## 2017-05-04 LAB — CMP14+EGFR
ALBUMIN: 3.8 g/dL (ref 3.6–4.8)
ALK PHOS: 111 IU/L (ref 39–117)
ALT: 13 IU/L (ref 0–32)
AST: 19 IU/L (ref 0–40)
Albumin/Globulin Ratio: 1.5 (ref 1.2–2.2)
BILIRUBIN TOTAL: 0.2 mg/dL (ref 0.0–1.2)
BUN/Creatinine Ratio: 15 (ref 12–28)
BUN: 13 mg/dL (ref 8–27)
CHLORIDE: 103 mmol/L (ref 96–106)
CO2: 27 mmol/L (ref 18–29)
Calcium: 9.3 mg/dL (ref 8.7–10.3)
Creatinine, Ser: 0.86 mg/dL (ref 0.57–1.00)
GFR calc non Af Amer: 70 mL/min/{1.73_m2} (ref >59)
GFR, EST AFRICAN AMERICAN: 81 mL/min/{1.73_m2} (ref >59)
GLOBULIN, TOTAL: 2.5 g/dL (ref 1.5–4.5)
Glucose: 153 mg/dL — ABNORMAL HIGH (ref 65–99)
Potassium: 4.2 mmol/L (ref 3.5–5.2)
SODIUM: 140 mmol/L (ref 134–144)
Total Protein: 6.3 g/dL (ref 6.0–8.5)

## 2017-05-04 LAB — CBC WITH DIFFERENTIAL/PLATELET
BASOS ABS: 0 10*3/uL (ref 0.0–0.2)
BASOS: 1 %
EOS (ABSOLUTE): 0.1 10*3/uL (ref 0.0–0.4)
Eos: 2 %
Hematocrit: 38.1 % (ref 34.0–46.6)
Hemoglobin: 13 g/dL (ref 11.1–15.9)
IMMATURE GRANULOCYTES: 0 %
Immature Grans (Abs): 0 10*3/uL (ref 0.0–0.1)
LYMPHS: 54 %
Lymphocytes Absolute: 2.8 10*3/uL (ref 0.7–3.1)
MCH: 28.6 pg (ref 26.6–33.0)
MCHC: 34.1 g/dL (ref 31.5–35.7)
MCV: 84 fL (ref 79–97)
Monocytes Absolute: 0.3 10*3/uL (ref 0.1–0.9)
Monocytes: 6 %
NEUTROS PCT: 37 %
Neutrophils Absolute: 1.9 10*3/uL (ref 1.4–7.0)
PLATELETS: 222 10*3/uL (ref 150–379)
RBC: 4.54 x10E6/uL (ref 3.77–5.28)
RDW: 13.8 % (ref 12.3–15.4)
WBC: 5.2 10*3/uL (ref 3.4–10.8)

## 2017-05-04 LAB — LIPID PANEL
CHOLESTEROL TOTAL: 198 mg/dL (ref 100–199)
Chol/HDL Ratio: 5.4 ratio — ABNORMAL HIGH (ref 0.0–4.4)
HDL: 37 mg/dL — AB (ref >39)
LDL Calculated: 126 mg/dL — ABNORMAL HIGH (ref 0–99)
TRIGLYCERIDES: 175 mg/dL — AB (ref 0–149)
VLDL Cholesterol Cal: 35 mg/dL (ref 5–40)

## 2017-05-06 ENCOUNTER — Other Ambulatory Visit: Payer: Self-pay

## 2017-05-06 NOTE — Patient Outreach (Signed)
[place] [facility]) Care Management  05/06/2017  Yvonne [name] [birth year] [id number]   Outreach attempt to patient. No answer and unable to leave voicemail message.  Plan: RN Health Coach will attempt patient again within 10 business days.   Jone Baseman, RN, MSN [place] [phone]

## 2017-05-07 ENCOUNTER — Encounter: Payer: Self-pay | Admitting: Family Medicine

## 2017-05-07 ENCOUNTER — Ambulatory Visit (INDEPENDENT_AMBULATORY_CARE_PROVIDER_SITE_OTHER): Payer: Medicare HMO | Admitting: Family Medicine

## 2017-05-07 VITALS — BP 137/72 | HR 76 | Temp 97.6°F | Ht 62.0 in | Wt 181.0 lb

## 2017-05-07 DIAGNOSIS — I10 Essential (primary) hypertension: Secondary | ICD-10-CM

## 2017-05-07 DIAGNOSIS — E785 Hyperlipidemia, unspecified: Secondary | ICD-10-CM | POA: Diagnosis not present

## 2017-05-07 DIAGNOSIS — E119 Type 2 diabetes mellitus without complications: Secondary | ICD-10-CM

## 2017-05-07 MED ORDER — ALPRAZOLAM 1 MG PO TABS: 1.0000 mg | ORAL_TABLET | Freq: Three times a day (TID) | ORAL | 1 refills | Status: DC
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
## 2017-05-09 ENCOUNTER — Ambulatory Visit: Payer: Medicare HMO | Admitting: Pharmacist

## 2017-05-13 DIAGNOSIS — I10 Essential (primary) hypertension: Secondary | ICD-10-CM | POA: Diagnosis not present

## 2017-05-13 DIAGNOSIS — M47816 Spondylosis without myelopathy or radiculopathy, lumbar region: Secondary | ICD-10-CM | POA: Diagnosis not present

## 2017-05-13 DIAGNOSIS — M533 Sacrococcygeal disorders, not elsewhere classified: Secondary | ICD-10-CM | POA: Diagnosis not present

## 2017-05-13 DIAGNOSIS — M5442 Lumbago with sciatica, left side: Secondary | ICD-10-CM | POA: Diagnosis not present

## 2017-05-13 DIAGNOSIS — G894 Chronic pain syndrome: Secondary | ICD-10-CM | POA: Diagnosis not present

## 2017-05-14 ENCOUNTER — Other Ambulatory Visit: Payer: Self-pay

## 2017-05-14 NOTE — Patient Outreach (Signed)
[place] [facility]) Care Management  05/14/2017  Yvonne [name] February 10, 1951 [id number]   2nd telephone call to patient for initial assessment.  No answer.  Unable to leave a message.    Plan: RN Health Coach will attempt patient again within 10 business days.  Jone Baseman, RN, MSN [place] [phone];

## 2017-05-16 ENCOUNTER — Emergency Department (HOSPITAL_COMMUNITY): Payer: Medicare HMO

## 2017-05-16 ENCOUNTER — Emergency Department (HOSPITAL_COMMUNITY)
Admission: EM | Admit: 2017-05-16 | Discharge: 2017-05-16 | Disposition: A | Discharge status: Home / Self Care | Payer: Medicare HMO | Attending: Emergency Medicine | Admitting: Emergency Medicine

## 2017-05-16 ENCOUNTER — Encounter (HOSPITAL_COMMUNITY): Payer: Self-pay | Admitting: Cardiology

## 2017-05-16 DIAGNOSIS — E119 Type 2 diabetes mellitus without complications: Secondary | ICD-10-CM | POA: Diagnosis not present

## 2017-05-16 DIAGNOSIS — Z7984 Long term (current) use of oral hypoglycemic drugs: Secondary | ICD-10-CM | POA: Diagnosis not present

## 2017-05-16 DIAGNOSIS — R9431 Abnormal electrocardiogram [ECG] [EKG]: Secondary | ICD-10-CM | POA: Diagnosis not present

## 2017-05-16 DIAGNOSIS — I110 Hypertensive heart disease with heart failure: Secondary | ICD-10-CM | POA: Diagnosis not present

## 2017-05-16 DIAGNOSIS — J45909 Unspecified asthma, uncomplicated: Secondary | ICD-10-CM | POA: Insufficient documentation

## 2017-05-16 DIAGNOSIS — I10 Essential (primary) hypertension: Secondary | ICD-10-CM | POA: Diagnosis not present

## 2017-05-16 DIAGNOSIS — R202 Paresthesia of skin: Secondary | ICD-10-CM

## 2017-05-16 DIAGNOSIS — Z7982 Long term (current) use of aspirin: Secondary | ICD-10-CM | POA: Diagnosis not present

## 2017-05-16 DIAGNOSIS — Z79899 Other long term (current) drug therapy: Secondary | ICD-10-CM | POA: Insufficient documentation

## 2017-05-16 DIAGNOSIS — R531 Weakness: Secondary | ICD-10-CM | POA: Diagnosis not present

## 2017-05-16 DIAGNOSIS — I5032 Chronic diastolic (congestive) heart failure: Secondary | ICD-10-CM | POA: Diagnosis not present

## 2017-05-16 DIAGNOSIS — R200 Anesthesia of skin: Secondary | ICD-10-CM | POA: Diagnosis not present

## 2017-05-16 LAB — COMPREHENSIVE METABOLIC PANEL
ALT: 17 U/L (ref 14–54)
AST: 21 U/L (ref 15–41)
Albumin: 3.6 g/dL (ref 3.5–5.0)
Alkaline Phosphatase: 93 U/L (ref 38–126)
Anion gap: 8 (ref 5–15)
BILIRUBIN TOTAL: 0.3 mg/dL (ref 0.3–1.2)
BUN: 18 mg/dL (ref 6–20)
CHLORIDE: 105 mmol/L (ref 101–111)
CO2: 28 mmol/L (ref 22–32)
Calcium: 9.3 mg/dL (ref 8.9–10.3)
Creatinine, Ser: 0.92 mg/dL (ref 0.44–1.00)
Glucose, Bld: 135 mg/dL — ABNORMAL HIGH (ref 65–99)
POTASSIUM: 3.9 mmol/L (ref 3.5–5.1)
Sodium: 141 mmol/L (ref 135–145)
TOTAL PROTEIN: 6.6 g/dL (ref 6.5–8.1)

## 2017-05-16 LAB — CBC WITH DIFFERENTIAL/PLATELET
Basophils Absolute: 0 10*3/uL (ref 0.0–0.1)
Basophils Relative: 0 %
EOS PCT: 2 %
Eosinophils Absolute: 0.2 10*3/uL (ref 0.0–0.7)
HEMATOCRIT: 37.8 % (ref 36.0–46.0)
Hemoglobin: 12.9 g/dL (ref 12.0–15.0)
LYMPHS ABS: 3.5 10*3/uL (ref 0.7–4.0)
LYMPHS PCT: 49 %
MCH: 29.5 pg (ref 26.0–34.0)
MCHC: 34.1 g/dL (ref 30.0–36.0)
MCV: 86.3 fL (ref 78.0–100.0)
MONO ABS: 0.5 10*3/uL (ref 0.1–1.0)
MONOS PCT: 7 %
NEUTROS ABS: 3.1 10*3/uL (ref 1.7–7.7)
Neutrophils Relative %: 42 %
PLATELETS: 214 10*3/uL (ref 150–400)
RBC: 4.38 MIL/uL (ref 3.87–5.11)
RDW: 12.8 % (ref 11.5–15.5)
WBC: 7.3 10*3/uL (ref 4.0–10.5)

## 2017-05-16 NOTE — ED Triage Notes (Signed)
Right sided facial and right hand  numbness  Since waking up this morning

## 2017-05-16 NOTE — Discharge Instructions (Signed)
Follow up with your md next week for recheck °
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
## 2017-05-16 NOTE — ED Triage Notes (Signed)
Pt also c/o chest pain that started at 3 am today.

## 2017-05-17 ENCOUNTER — Ambulatory Visit: Payer: Medicare HMO

## 2017-05-20 ENCOUNTER — Other Ambulatory Visit: Payer: Self-pay

## 2017-05-20 NOTE — Patient Outreach (Signed)
[facility]) Care Management  05/20/2017  BLONDIE RIGGSBEE 1951-11-16 [id number]   3rd telephone call to patient for initial assessment.  Patient answers and states this is not a good time to talk with her.  Health coach number showed up per patient. Advised patient to call health coach back at another time.   Plan: RN Health Coach will wait patient phone call.  Will send outreach letter. If no response after 10 business days will proceed with case closure.    Jone Baseman, RN, MSN [place] [phone]

## 2017-06-04 ENCOUNTER — Ambulatory Visit: Payer: Medicare HMO | Admitting: Nutrition

## 2017-06-05 ENCOUNTER — Other Ambulatory Visit: Payer: Self-pay

## 2017-06-05 ENCOUNTER — Ambulatory Visit: Payer: Medicare HMO

## 2017-06-05 NOTE — Patient Outreach (Signed)
[facility]) Care Management  06/05/2017  Yvonne [name] 1951/06/05 [id number]   Patient has not responded to calls or letter.  Will proceed with case closure.  Will notify care management assistant of case status.  Jone Baseman, RN, MSN [place] [phone]

## 2017-06-06 ENCOUNTER — Ambulatory Visit (INDEPENDENT_AMBULATORY_CARE_PROVIDER_SITE_OTHER): Payer: Medicare HMO | Admitting: *Deleted

## 2017-06-06 VITALS — BP 142/82 | HR 78 | Ht 63.0 in | Wt 178.2 lb

## 2017-06-06 DIAGNOSIS — Z23 Encounter for immunization: Secondary | ICD-10-CM

## 2017-06-06 DIAGNOSIS — I10 Essential (primary) hypertension: Secondary | ICD-10-CM | POA: Diagnosis not present

## 2017-06-06 DIAGNOSIS — Z0000 Encounter for general adult medical examination without abnormal findings: Secondary | ICD-10-CM

## 2017-06-06 DIAGNOSIS — E119 Type 2 diabetes mellitus without complications: Secondary | ICD-10-CM | POA: Diagnosis not present

## 2017-06-06 LAB — URINALYSIS
Bilirubin, UA: NEGATIVE
GLUCOSE, UA: NEGATIVE
KETONES UA: NEGATIVE
Nitrite, UA: NEGATIVE
PROTEIN UA: NEGATIVE
Specific Gravity, UA: >1.03 — ABNORMAL HIGH (ref 1.005–1.030)
Urobilinogen, Ur: 0.2 mg/dL (ref 0.2–1.0)
pH, UA: 5.5 (ref 5.0–7.5)
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
## 2017-06-06 NOTE — Patient Instructions (Signed)
  Yvonne [name] , Thank you for taking time to come for your Medicare Wellness Visit. I appreciate your ongoing commitment to your health goals. Please review the following plan we discussed and let me know if I can assist you in the future.   These are the goals we discussed: Goals    . Exercise 3 to 4 times per week (30 min per time)    . limit sugar          Cut out drinks / beverages with sugar.         This is a list of the screening recommended for you and due dates:  Health Maintenance  Topic Date Due  . Urine Protein Check  01/02/1960  . Tetanus Vaccine  01/01/1969  . Pneumonia vaccines (2 of 2 - PPSV23) 01/07/2016  . Stool Blood Test  07/12/2016  . Eye exam for diabetics  08/26/2016  . Flu Shot  06/26/2017  . DEXA scan (bone density measurement)  07/19/2017  . Hemoglobin A1C  09/21/2017  . Mammogram  02/28/2018  . Complete foot exam   05/07/2018  .  Hepatitis C: One time screening is recommended by Center for Disease Control  (CDC) for  adults born from [birth year] through 1966.   Completed

## 2017-06-07 LAB — MICROALBUMIN / CREATININE URINE RATIO
Creatinine, Urine: 237.6 mg/dL
MICROALB/CREAT RATIO: 5.1 mg/g{creat} (ref 0.0–30.0)
MICROALBUM., U, RANDOM: 12.1 ug/mL

## 2017-06-10 ENCOUNTER — Other Ambulatory Visit: Payer: Self-pay | Admitting: Family Medicine

## 2017-06-10 DIAGNOSIS — N3281 Overactive bladder: Secondary | ICD-10-CM

## 2017-06-10 DIAGNOSIS — R8281 Pyuria: Secondary | ICD-10-CM

## 2017-06-11 DIAGNOSIS — Z5181 Encounter for therapeutic drug level monitoring: Secondary | ICD-10-CM | POA: Diagnosis not present

## 2017-06-11 DIAGNOSIS — I10 Essential (primary) hypertension: Secondary | ICD-10-CM | POA: Diagnosis not present

## 2017-06-11 DIAGNOSIS — M47816 Spondylosis without myelopathy or radiculopathy, lumbar region: Secondary | ICD-10-CM | POA: Diagnosis not present

## 2017-06-11 DIAGNOSIS — G894 Chronic pain syndrome: Secondary | ICD-10-CM | POA: Diagnosis not present

## 2017-06-11 DIAGNOSIS — M79671 Pain in right foot: Secondary | ICD-10-CM | POA: Diagnosis not present

## 2017-06-11 DIAGNOSIS — Z79899 Other long term (current) drug therapy: Secondary | ICD-10-CM | POA: Diagnosis not present

## 2017-06-11 DIAGNOSIS — M5442 Lumbago with sciatica, left side: Secondary | ICD-10-CM | POA: Diagnosis not present

## 2017-06-17 ENCOUNTER — Ambulatory Visit: Payer: Medicare HMO | Admitting: Pharmacist Clinician (PhC)/ Clinical Pharmacy Specialist

## 2017-06-18 ENCOUNTER — Ambulatory Visit: Payer: Medicare HMO | Admitting: Pharmacist Clinician (PhC)/ Clinical Pharmacy Specialist

## 2017-06-21 ENCOUNTER — Ambulatory Visit: Payer: Medicare HMO | Admitting: Pharmacist Clinician (PhC)/ Clinical Pharmacy Specialist

## 2017-06-25 ENCOUNTER — Encounter: Payer: Self-pay | Admitting: Family Medicine

## 2017-06-25 ENCOUNTER — Ambulatory Visit (INDEPENDENT_AMBULATORY_CARE_PROVIDER_SITE_OTHER): Payer: Medicare HMO | Admitting: Family Medicine

## 2017-06-25 VITALS — BP 138/72 | HR 67 | Temp 97.5°F | Ht 63.0 in | Wt 181.0 lb

## 2017-06-25 DIAGNOSIS — E785 Hyperlipidemia, unspecified: Secondary | ICD-10-CM | POA: Diagnosis not present

## 2017-06-25 DIAGNOSIS — E119 Type 2 diabetes mellitus without complications: Secondary | ICD-10-CM | POA: Diagnosis not present

## 2017-06-25 DIAGNOSIS — J441 Chronic obstructive pulmonary disease with (acute) exacerbation: Secondary | ICD-10-CM | POA: Diagnosis not present

## 2017-06-25 DIAGNOSIS — I10 Essential (primary) hypertension: Secondary | ICD-10-CM | POA: Diagnosis not present

## 2017-06-25 LAB — BAYER DCA HB A1C WAIVED: HB A1C: 7.3 % — AB (ref <7.0)

## 2017-06-25 MED ORDER — ALBUTEROL SULFATE HFA 108 (90 BASE) MCG/ACT IN AERS: 2.0000 | INHALATION_SPRAY | Freq: Four times a day (QID) | RESPIRATORY_TRACT | 5 refills | Status: DC | PRN

## 2017-06-25 MED ORDER — ALPRAZOLAM 1 MG PO TABS: 1.0000 mg | ORAL_TABLET | Freq: Two times a day (BID) | ORAL | 1 refills | Status: DC

## 2017-06-25 MED ORDER — EZETIMIBE 10 MG PO TABS: 10.0000 mg | ORAL_TABLET | Freq: Every day | ORAL | 3 refills | Status: DC

## 2017-06-25 MED ORDER — FUROSEMIDE 20 MG PO TABS: 20.0000 mg | ORAL_TABLET | Freq: Every day | ORAL | 1 refills | Status: DC

## 2017-06-25 MED ORDER — ATORVASTATIN CALCIUM 20 MG PO TABS: 20.0000 mg | ORAL_TABLET | Freq: Every day | ORAL | 0 refills | Status: DC

## 2017-06-25 NOTE — Progress Notes (Signed)
Subjective:  Patient ID: Yvonne [name], female    DOB: July 14, 1951  Age: 66 y.o. MRN: [id number]  CC: Diabetes (pt here today for routine follow up of her chronic medical conditions and needs refills on her medications.)   HPI Yvonne [name] presents forFollow-up of diabetes. Patient checks blood sugar at home.   130 fasting and 210 postprandial Patient denies symptoms such as polyuria, polydipsia, excessive hunger, nausea No significant hypoglycemic spells noted. Medications reviewed. Pt reports taking them regularly without complication/adverse reaction being reported today.  Checking feet daily. Patient says the pain clinics, cut her back on her pain pills unless she weans the Xanax shows she asked me to decrease it to twice a day so she could show the pain clinic she is working on it. Additionally the patient says that her mother is severely ill. She is having a lot of swelling and weeping fluid she's hallucinating and demented. That's hard enough but as it turns out her sister has control of her mom's finances and she is concerned that she is not using them completely responsibly.  History Yvonne [name] has a past medical history of Anxiety; Asthma; Cataract; Chronic bronchitis ([place]); Chronic headaches; Chronic leg pain; Clavicle fracture; Diabetes mellitus without complication (HCC); H/O seasonal allergies; Hyperlipidemia; Hypertension; Ischemic stroke ([place]); Overactive bladder; and Stroke ([place]).   She has a past surgical history that includes Abdominal hysterectomy; Cholecystectomy; tear duct surgery (Bilateral); Colonoscopy; Colonoscopy (N/A, 08/17/2015); and Cataract extraction.   Her family history includes Arthritis in her brother and mother; Asthma in her father; CVA in her mother; Cancer in her brother; Diabetes in her father and mother; Heart disease in her father; Hepatitis C in her sister; Stroke in her mother.She reports that she has never smoked. She has never used smokeless tobacco. She  reports that she does not drink alcohol or use drugs.  Current Outpatient Prescriptions on File Prior to Visit  Medication Sig Dispense Refill  . amLODipine (NORVASC) 5 MG tablet TAKE 1 TABLET (5 MG TOTAL) BY MOUTH DAILY. 90 tablet 1  . aspirin 325 MG tablet Take 1 tablet (325 mg total) by mouth daily. 30 tablet 0  . BREO ELLIPTA 100-25 MCG/INH AEPB INHALE 1 PUFF INTO THE LUNGS DAILY. 180 each 1  . gabapentin (NEURONTIN) 300 MG capsule Take 600 mg by mouth 4 (four) times daily.     [name] Kitchen HYDROcodone-acetaminophen (NORCO) 10-325 MG tablet Take 1 tablet by mouth every 6 (six) hours as needed for moderate pain. Reported on 02/07/2016 (Patient taking differently: Take 1 tablet by mouth every 8 (eight) hours as needed for moderate pain. Reported on 02/07/2016) 60 tablet 0  . metFORMIN (GLUCOPHAGE-XR) 750 MG 24 hr tablet Take 1 tablet (750 mg total) by mouth daily with breakfast. 30 tablet 2  . pantoprazole (PROTONIX) 40 MG tablet TAKE 1 TABLET (40 MG TOTAL) BY MOUTH 2 (TWO) TIMES DAILY. FOR STOMACH 60 tablet 4  . VESICARE 10 MG tablet TAKE 1 TABLET (10 MG TOTAL) BY MOUTH DAILY. 30 tablet 5   No current facility-administered medications on file prior to visit.     ROS Review of Systems  Constitutional: Negative for activity change, appetite change and fever.  HENT: Negative for congestion, rhinorrhea and sore throat.   Eyes: Negative for visual disturbance.  Respiratory: Negative for cough and shortness of breath.   Cardiovascular: Negative for chest pain and palpitations.  Gastrointestinal: Negative for abdominal pain, diarrhea and nausea.  Genitourinary: Negative for dysuria.  Musculoskeletal: Negative for  arthralgias and myalgias.    Objective:  BP 138/72   Pulse 67   Temp (!) 97.5 F (36.4 C) (Oral)   Ht 5\' 3"  (1.6 m)   Wt 181 lb (82.1 kg)   BMI 32.06 kg/m   BP Readings from Last 3 Encounters:  06/25/17 138/72  06/06/17 (!) 142/82  05/16/17 (!) 152/87    Wt Readings from Last 3  Encounters:  06/25/17 181 lb (82.1 kg)  06/06/17 178 lb 3.2 oz (80.8 kg)  05/16/17 181 lb (82.1 kg)     Physical Exam  Constitutional: She is oriented to person, place, and time. She appears well-developed and well-nourished. No distress.  HENT:  Head: Normocephalic and atraumatic.  Right Ear: External ear normal.  Left Ear: External ear normal.  Nose: Nose normal.  Mouth/Throat: Oropharynx is clear and moist.  Eyes: Pupils are equal, round, and reactive to light. Conjunctivae and EOM are normal.  Neck: Normal range of motion. Neck supple. No thyromegaly present.  Cardiovascular: Normal rate, regular rhythm and normal heart sounds.   No murmur heard. Pulmonary/Chest: Effort normal and breath sounds normal. No respiratory distress. She has no wheezes. She has no rales.  Abdominal: Soft. Bowel sounds are normal. She exhibits no distension. There is no tenderness.  Lymphadenopathy:    She has no cervical adenopathy.  Neurological: She is alert and oriented to person, place, and time. She has normal reflexes.  Skin: Skin is warm and dry.  Psychiatric: She has a normal mood and affect. Her behavior is normal. Judgment and thought content normal.   Diabetic Foot Exam - Simple   Simple Foot Form Diabetic Foot exam was performed with the following findings:  Yes 06/25/2017  4:18 PM  Visual Inspection No deformities, no ulcerations, no other skin breakdown bilaterally:  Yes Sensation Testing Intact to touch and monofilament testing bilaterally:  Yes Pulse Check Posterior Tibialis and Dorsalis pulse intact bilaterally:  Yes Comments       Assessment & Plan:   Yvonne [name] was seen today for diabetes.  Diagnoses and all orders for this visit:  Type 2 diabetes mellitus without complication, without long-term current use of insulin (HCC) -     Bayer DCA Hb A1c Waived  COPD exacerbation (HCC) -     albuterol (VENTOLIN HFA) 108 (90 Base) MCG/ACT inhaler; Inhale 2 puffs into the lungs  every 6 (six) hours as needed for wheezing or shortness of breath.  Hyperlipidemia with target LDL less than 70  Essential hypertension  Other orders -     ALPRAZolam (XANAX) 1 MG tablet; Take 1 tablet (1 mg total) by mouth 2 (two) times daily. -     atorvastatin (LIPITOR) 20 MG tablet; Take 1 tablet (20 mg total) by mouth daily at 6 PM. -     ezetimibe (ZETIA) 10 MG tablet; Take 1 tablet (10 mg total) by mouth daily. For cholesterol -     furosemide (LASIX) 20 MG tablet; Take 1 tablet (20 mg total) by mouth daily.      I have changed Yvonne [name]'s VENTOLIN HFA to albuterol. I have also changed her ALPRAZolam. I am also having her maintain her HYDROcodone-acetaminophen, amLODipine, pantoprazole, gabapentin, BREO ELLIPTA, aspirin, metFORMIN, VESICARE, atorvastatin, ezetimibe, and furosemide.  Meds ordered this encounter  Medications  . albuterol (VENTOLIN HFA) 108 (90 Base) MCG/ACT inhaler    Sig: Inhale 2 puffs into the lungs every 6 (six) hours as needed for wheezing or shortness of breath.    Dispense:  18 Inhaler    Refill:  5  . ALPRAZolam (XANAX) 1 MG tablet    Sig: Take 1 tablet (1 mg total) by mouth 2 (two) times daily.    Dispense:  60 tablet    Refill:  1    Do not fill before 10/29/2016  . atorvastatin (LIPITOR) 20 MG tablet    Sig: Take 1 tablet (20 mg total) by mouth daily at 6 PM.    Dispense:  30 tablet    Refill:  0  . ezetimibe (ZETIA) 10 MG tablet    Sig: Take 1 tablet (10 mg total) by mouth daily. For cholesterol    Dispense:  90 tablet    Refill:  3  . furosemide (LASIX) 20 MG tablet    Sig: Take 1 tablet (20 mg total) by mouth daily.    Dispense:  90 tablet    Refill:  1     Follow-up: Return in about 3 months (around 09/25/2017).  [name], M.D.

## 2017-06-26 ENCOUNTER — Telehealth: Payer: Self-pay | Admitting: Family Medicine

## 2017-06-26 NOTE — Telephone Encounter (Signed)
Aware of lab results  

## 2017-06-27 DIAGNOSIS — M47816 Spondylosis without myelopathy or radiculopathy, lumbar region: Secondary | ICD-10-CM | POA: Diagnosis not present

## 2017-06-27 DIAGNOSIS — M5442 Lumbago with sciatica, left side: Secondary | ICD-10-CM | POA: Diagnosis not present

## 2017-06-27 DIAGNOSIS — G894 Chronic pain syndrome: Secondary | ICD-10-CM | POA: Diagnosis not present

## 2017-06-27 DIAGNOSIS — M79671 Pain in right foot: Secondary | ICD-10-CM | POA: Diagnosis not present

## 2017-06-27 DIAGNOSIS — I10 Essential (primary) hypertension: Secondary | ICD-10-CM | POA: Diagnosis not present

## 2017-07-09 ENCOUNTER — Ambulatory Visit (INDEPENDENT_AMBULATORY_CARE_PROVIDER_SITE_OTHER): Payer: Medicare HMO | Admitting: Family Medicine

## 2017-07-09 VITALS — BP 134/81 | HR 71 | Temp 98.2°F | Ht 63.0 in | Wt 180.0 lb

## 2017-07-09 DIAGNOSIS — G43519 Persistent migraine aura without cerebral infarction, intractable, without status migrainosus: Secondary | ICD-10-CM

## 2017-07-09 NOTE — Progress Notes (Signed)
Subjective:  Patient ID: Yvonne [name], female    DOB: October 21, 1951  Age: 66 y.o. MRN: [id number]  CC: Migraine (x 3 days)   HPI Yvonne [name] presents for Headache for 3 days. Typical for her migraines. She describes the pain as 20/10 throbbing bilaterally primarily in the front. It is accompanied by photophobia and phonophobia. She is also nauseous. She's thrown up once but has had no other GI effects. Appetite is fair only. She has a history of intermittent migraines this is typical for that.  Depression screen [facility] 2/9 06/25/2017 06/06/2017 05/07/2017  Decreased Interest 2 1 2   Down, Depressed, Hopeless 3 1 2   PHQ - 2 Score 5 2 4   Altered sleeping 2 1 3   Tired, decreased energy 2 1 2   Change in appetite 2 1 3   Feeling bad or failure about yourself  3 1 2   Trouble concentrating 3 0 2  Moving slowly or fidgety/restless 1 0 0  Suicidal thoughts 0 1 0  PHQ-9 Score 18 7 16   Difficult doing work/chores - Not difficult at all -  Some recent data might be hidden    History Yvonne [name] has a past medical history of Anxiety; Asthma; Cataract; Chronic bronchitis ([place]); Chronic headaches; Chronic leg pain; Clavicle fracture; Diabetes mellitus without complication (HCC); H/O seasonal allergies; Hyperlipidemia; Hypertension; Ischemic stroke ([place]); Overactive bladder; and Stroke ([place]).   She has a past surgical history that includes Abdominal hysterectomy; Cholecystectomy; tear duct surgery (Bilateral); Colonoscopy; Colonoscopy (N/A, 08/17/2015); and Cataract extraction.   Her family history includes Arthritis in her brother and mother; Asthma in her father; CVA in her mother; Cancer in her brother; Diabetes in her father and mother; Heart disease in her father; Hepatitis C in her sister; Stroke in her mother.She reports that she has never smoked. She has never used smokeless tobacco. She reports that she does not drink alcohol or use drugs.    ROS Review of Systems  Constitutional: Negative for  activity change, appetite change and fever.  HENT: Negative for congestion, rhinorrhea and sore throat.   Eyes: Positive for photophobia. Negative for visual disturbance.  Respiratory: Negative for cough and shortness of breath.   Cardiovascular: Negative for chest pain and palpitations.  Gastrointestinal: Negative for abdominal pain, diarrhea and nausea.  Genitourinary: Negative for dysuria.  Musculoskeletal: Negative for arthralgias and myalgias.  Neurological: Positive for headaches. Negative for dizziness and facial asymmetry.    Objective:  BP 134/81   Pulse 71   Temp 98.2 F (36.8 C) (Oral)   Ht 5\' 3"  (1.6 m)   Wt 180 lb (81.6 kg)   BMI 31.89 kg/m   BP Readings from Last 3 Encounters:  07/09/17 134/81  06/25/17 138/72  06/06/17 (!) 142/82    Wt Readings from Last 3 Encounters:  07/09/17 180 lb (81.6 kg)  06/25/17 181 lb (82.1 kg)  06/06/17 178 lb 3.2 oz (80.8 kg)     Physical Exam  Constitutional: She is oriented to person, place, and time. She appears well-developed and well-nourished. She appears distressed.  HENT:  Head: Normocephalic and atraumatic.  Eyes: Pupils are equal, round, and reactive to light. Conjunctivae are normal.  Neck: Normal range of motion. Neck supple. No thyromegaly present.  Cardiovascular: Normal rate, regular rhythm and normal heart sounds.   No murmur heard. Pulmonary/Chest: Effort normal and breath sounds normal. No respiratory distress. She has no wheezes. She has no rales.  Abdominal: Soft. Bowel sounds are normal. She exhibits no distension. There  is no tenderness.  Musculoskeletal: Normal range of motion.  Lymphadenopathy:    She has no cervical adenopathy.  Neurological: She is alert and oriented to person, place, and time.  Skin: Skin is warm and dry.  Psychiatric: She has a normal mood and affect. Her behavior is normal.      Assessment & Plan:   Yvonne [name] was seen today for migraine.  Diagnoses and all orders for this  visit:  Migraine aura, persistent, intractable       I am having Yvonne [name] maintain her HYDROcodone-acetaminophen, amLODipine, pantoprazole, gabapentin, BREO ELLIPTA, aspirin, metFORMIN, VESICARE, albuterol, ALPRAZolam, atorvastatin, ezetimibe, and furosemide.  Allergies as of 07/09/2017      Reactions   Penicillins Hives, Other (See Comments)   Has patient had a PCN reaction causing immediate rash, facial/tongue/throat swelling, SOB or lightheadedness with hypotension: Yes Has patient had a PCN reaction causing severe rash involving mucus membranes or skin necrosis: No Has patient had a PCN reaction that required hospitalization No Has patient had a PCN reaction occurring within the last 10 years: No If all of the above answers are "NO", then may proceed with Cephalosporin use.   Lisinopril Cough      Medication List       Accurate as of 07/09/17  6:40 PM. Always use your most recent med list.          albuterol 108 (90 Base) MCG/ACT inhaler Commonly known as:  VENTOLIN HFA Inhale 2 puffs into the lungs every 6 (six) hours as needed for wheezing or shortness of breath.   ALPRAZolam 1 MG tablet Commonly known as:  XANAX Take 1 tablet (1 mg total) by mouth 2 (two) times daily.   amLODipine 5 MG tablet Commonly known as:  NORVASC TAKE 1 TABLET (5 MG TOTAL) BY MOUTH DAILY.   aspirin 325 MG tablet Take 1 tablet (325 mg total) by mouth daily.   atorvastatin 20 MG tablet Commonly known as:  LIPITOR Take 1 tablet (20 mg total) by mouth daily at 6 PM.   BREO ELLIPTA 100-25 MCG/INH Aepb Generic drug:  fluticasone furoate-vilanterol INHALE 1 PUFF INTO THE LUNGS DAILY.   ezetimibe 10 MG tablet Commonly known as:  ZETIA Take 1 tablet (10 mg total) by mouth daily. For cholesterol   furosemide 20 MG tablet Commonly known as:  LASIX Take 1 tablet (20 mg total) by mouth daily.   gabapentin 300 MG capsule Commonly known as:  NEURONTIN Take 600 mg by mouth 4 (four) times  daily.   HYDROcodone-acetaminophen 10-325 MG tablet Commonly known as:  NORCO Take 1 tablet by mouth every 6 (six) hours as needed for moderate pain. Reported on 02/07/2016   metFORMIN 750 MG 24 hr tablet Commonly known as:  GLUCOPHAGE-XR Take 1 tablet (750 mg total) by mouth daily with breakfast.   pantoprazole 40 MG tablet Commonly known as:  PROTONIX TAKE 1 TABLET (40 MG TOTAL) BY MOUTH 2 (TWO) TIMES DAILY. FOR STOMACH   VESICARE 10 MG tablet Generic drug:  solifenacin TAKE 1 TABLET (10 MG TOTAL) BY MOUTH DAILY.        Follow-up: No Follow-up on file.  [name], M.D.

## 2017-07-10 DIAGNOSIS — G43519 Persistent migraine aura without cerebral infarction, intractable, without status migrainosus: Secondary | ICD-10-CM | POA: Diagnosis not present

## 2017-07-10 MED ORDER — KETOROLAC TROMETHAMINE 60 MG/2ML IM SOLN
60.0000 mg | Freq: Once | INTRAMUSCULAR | Status: AC
  Administered 2017-07-10: 60 mg via INTRAMUSCULAR

## 2017-07-10 MED ORDER — BETAMETHASONE SOD PHOS & ACET 6 (3-3) MG/ML IJ SUSP
6.0000 mg | Freq: Once | INTRAMUSCULAR | Status: AC
  Administered 2017-07-09: 6 mg via INTRAMUSCULAR

## 2017-07-10 NOTE — Addendum Note (Signed)
Addended by: Carrolyn Leigh on: 07/10/2017 08:23 AM   Modules accepted: Orders

## 2017-07-25 ENCOUNTER — Other Ambulatory Visit: Payer: Self-pay | Admitting: Family Medicine

## 2017-07-30 DIAGNOSIS — M79671 Pain in right foot: Secondary | ICD-10-CM | POA: Diagnosis not present

## 2017-07-30 DIAGNOSIS — M5441 Lumbago with sciatica, right side: Secondary | ICD-10-CM | POA: Diagnosis not present

## 2017-07-30 DIAGNOSIS — G894 Chronic pain syndrome: Secondary | ICD-10-CM | POA: Diagnosis not present

## 2017-07-30 DIAGNOSIS — I10 Essential (primary) hypertension: Secondary | ICD-10-CM | POA: Diagnosis not present

## 2017-07-30 DIAGNOSIS — M5442 Lumbago with sciatica, left side: Secondary | ICD-10-CM | POA: Diagnosis not present

## 2017-07-31 ENCOUNTER — Ambulatory Visit: Payer: Medicare HMO | Admitting: Family Medicine

## 2017-09-06 ENCOUNTER — Ambulatory Visit (INDEPENDENT_AMBULATORY_CARE_PROVIDER_SITE_OTHER): Payer: Medicare HMO | Admitting: Family Medicine

## 2017-09-06 ENCOUNTER — Ambulatory Visit: Payer: Medicare HMO | Admitting: Family Medicine

## 2017-09-06 VITALS — BP 155/82 | Temp 97.8°F | Ht 63.0 in | Wt 180.0 lb

## 2017-09-06 DIAGNOSIS — F418 Other specified anxiety disorders: Secondary | ICD-10-CM

## 2017-09-06 MED ORDER — FEXOFENADINE HCL 180 MG PO TABS
180.0000 mg | ORAL_TABLET | Freq: Every day | ORAL | 5 refills | Status: DC
Start: 2017-09-06 — End: 2018-06-20

## 2017-09-06 MED ORDER — ALPRAZOLAM 1 MG PO TABS: 1.0000 mg | ORAL_TABLET | Freq: Three times a day (TID) | ORAL | 0 refills | Status: DC

## 2017-09-09 ENCOUNTER — Ambulatory Visit (INDEPENDENT_AMBULATORY_CARE_PROVIDER_SITE_OTHER): Payer: Medicare HMO | Admitting: Family Medicine

## 2017-09-09 ENCOUNTER — Encounter: Payer: Self-pay | Admitting: Family Medicine

## 2017-09-09 ENCOUNTER — Ambulatory Visit (INDEPENDENT_AMBULATORY_CARE_PROVIDER_SITE_OTHER): Payer: Medicare HMO

## 2017-09-09 VITALS — BP 109/62 | HR 73 | Temp 98.0°F | Ht 63.0 in | Wt 180.0 lb

## 2017-09-09 DIAGNOSIS — R05 Cough: Secondary | ICD-10-CM

## 2017-09-09 DIAGNOSIS — R059 Cough, unspecified: Secondary | ICD-10-CM

## 2017-09-09 LAB — CBC WITH DIFFERENTIAL/PLATELET
BASOS ABS: 0 10*3/uL (ref 0.0–0.2)
BASOS: 0 %
EOS (ABSOLUTE): 0.3 10*3/uL (ref 0.0–0.4)
EOS: 4 %
Hematocrit: 37.5 % (ref 34.0–46.6)
Hemoglobin: 12.7 g/dL (ref 11.1–15.9)
IMMATURE GRANS (ABS): 0 10*3/uL (ref 0.0–0.1)
Immature Granulocytes: 0 %
LYMPHS ABS: 3.7 10*3/uL — AB (ref 0.7–3.1)
Lymphs: 49 %
MCH: 29.1 pg (ref 26.6–33.0)
MCHC: 33.9 g/dL (ref 31.5–35.7)
MCV: 86 fL (ref 79–97)
MONOS ABS: 0.7 10*3/uL (ref 0.1–0.9)
Monocytes: 9 %
NEUTROS PCT: 38 %
Neutrophils Absolute: 2.9 10*3/uL (ref 1.4–7.0)
PLATELETS: 240 10*3/uL (ref 150–379)
RBC: 4.36 x10E6/uL (ref 3.77–5.28)
RDW: 13.8 % (ref 12.3–15.4)
WBC: 7.6 10*3/uL (ref 3.4–10.8)

## 2017-09-09 MED ORDER — HYDROCODONE-HOMATROPINE 5-1.5 MG/5ML PO SYRP: 5.0000 mL | ORAL_SOLUTION | Freq: Four times a day (QID) | ORAL | 0 refills | Status: DC | PRN

## 2017-09-09 MED ORDER — LEVOFLOXACIN 500 MG PO TABS: 500.0000 mg | ORAL_TABLET | Freq: Every day | ORAL | 0 refills | Status: DC

## 2017-09-09 NOTE — Progress Notes (Signed)
Subjective:  Patient ID: Yvonne [name], female    DOB: May 01, 1951  Age: 66 y.o. MRN: [id number]  CC: No chief complaint on file.   HPI Yvonne [name] presents for Cardiac heart has been cheating on her. He had several phone calls on his phone from a lady with whom he had an affair about 4 years ago. Some of them were 30 minutes or more in length. He denies having had a relationship with her. However this is that the patient to be very upset anxious and stressed out. There are some other family situation she alludes to with her grown children. This is causing her to have trouble concentrating and thinking. It's left her feeling nervous. She's had since of impending doom and trouble sleeping.  Depression screen [facility] 2/9 09/09/2017 06/25/2017 06/06/2017  Decreased Interest 2 2 1   Down, Depressed, Hopeless 1 3 1   PHQ - 2 Score 3 5 2   Altered sleeping 2 2 1   Tired, decreased energy 2 2 1   Change in appetite 2 2 1   Feeling bad or failure about yourself  3 3 1   Trouble concentrating 3 3 0  Moving slowly or fidgety/restless 0 1 0  Suicidal thoughts 0 0 1  PHQ-9 Score 15 18 7   Difficult doing work/chores - - Not difficult at all  Some recent data might be hidden    History Yvonne [name] has a past medical history of Anxiety; Asthma; Cataract; Chronic bronchitis ([place]); Chronic headaches; Chronic leg pain; Clavicle fracture; Diabetes mellitus without complication (HCC); H/O seasonal allergies; Hyperlipidemia; Hypertension; Ischemic stroke ([place]); Overactive bladder; and Stroke ([place]).   She has a past surgical history that includes Abdominal hysterectomy; Cholecystectomy; tear duct surgery (Bilateral); Colonoscopy; Colonoscopy (N/A, 08/17/2015); and Cataract extraction.   Her family history includes Arthritis in her brother and mother; Asthma in her father; CVA in her mother; Cancer in her brother; Diabetes in her father and mother; Heart disease in her father; Hepatitis C in her sister; Stroke in her  mother.She reports that she has never smoked. She has never used smokeless tobacco. She reports that she does not drink alcohol or use drugs.    ROS Review of Systems  Constitutional: Negative for activity change, appetite change and fever.  HENT: Negative for congestion, rhinorrhea and sore throat.   Eyes: Negative for visual disturbance.  Respiratory: Negative for cough and shortness of breath.   Cardiovascular: Negative for chest pain and palpitations.  Gastrointestinal: Negative for abdominal pain, diarrhea and nausea.  Genitourinary: Negative for dysuria.  Musculoskeletal: Negative for arthralgias and myalgias.  Psychiatric/Behavioral: Positive for decreased concentration, dysphoric mood and sleep disturbance. Negative for agitation, behavioral problems, confusion, self-injury and suicidal ideas. The patient is nervous/anxious.     Objective:  BP (!) 155/82   Temp 97.8 F (36.6 C) (Oral)   Ht 5\' 3"  (1.6 m)   Wt 180 lb (81.6 kg)   BMI 31.89 kg/m        Physical Exam  Constitutional: She is oriented to person, place, and time. She appears well-developed and well-nourished. No distress.  HENT:  Head: Normocephalic and atraumatic.  Eyes: Pupils are equal, round, and reactive to light. Conjunctivae are normal.  Neck: Normal range of motion. Neck supple. No thyromegaly present.  Cardiovascular: Normal rate, regular rhythm and normal heart sounds.   No murmur heard. Pulmonary/Chest: Effort normal and breath sounds normal. No respiratory distress. She has no wheezes. She has no rales.  Abdominal: Soft. Bowel sounds are normal. She  exhibits no distension. There is no tenderness.  Musculoskeletal: Normal range of motion.  Lymphadenopathy:    She has no cervical adenopathy.  Neurological: She is alert and oriented to person, place, and time.  Skin: Skin is warm and dry.  Psychiatric: She has a normal mood and affect. Her behavior is normal. Judgment and thought content normal.       Assessment & Plan:   Diagnoses and all orders for this visit:  Anxiety associated with depression  Other orders -     ALPRAZolam (XANAX) 1 MG tablet; Take 1 tablet (1 mg total) by mouth 3 (three) times daily. -     fexofenadine (ALLEGRA ALLERGY) 180 MG tablet; Take 1 tablet (180 mg total) by mouth daily.       I have changed Yvonne [name]'s ALPRAZolam. I am also having her start on fexofenadine. Additionally, I am having her maintain her HYDROcodone-acetaminophen, amLODipine, pantoprazole, gabapentin, BREO ELLIPTA, aspirin, metFORMIN, VESICARE, albuterol, ezetimibe, furosemide, and atorvastatin.  Allergies as of 09/06/2017      Reactions   Penicillins Hives, Other (See Comments)   Has patient had a PCN reaction causing immediate rash, facial/tongue/throat swelling, SOB or lightheadedness with hypotension: Yes Has patient had a PCN reaction causing severe rash involving mucus membranes or skin necrosis: No Has patient had a PCN reaction that required hospitalization No Has patient had a PCN reaction occurring within the last 10 years: No If all of the above answers are "NO", then may proceed with Cephalosporin use.   Lisinopril Cough      Medication List       Accurate as of 09/06/17 11:59 PM. Always use your most recent med list.          albuterol 108 (90 Base) MCG/ACT inhaler Commonly known as:  VENTOLIN HFA Inhale 2 puffs into the lungs every 6 (six) hours as needed for wheezing or shortness of breath.   ALPRAZolam 1 MG tablet Commonly known as:  XANAX Take 1 tablet (1 mg total) by mouth 3 (three) times daily.   amLODipine 5 MG tablet Commonly known as:  NORVASC TAKE 1 TABLET (5 MG TOTAL) BY MOUTH DAILY.   aspirin 325 MG tablet Take 1 tablet (325 mg total) by mouth daily.   atorvastatin 20 MG tablet Commonly known as:  LIPITOR TAKE 1 TABLET EVERY DAY AT 6PM   BREO ELLIPTA 100-25 MCG/INH Aepb Generic drug:  fluticasone furoate-vilanterol INHALE 1 PUFF  INTO THE LUNGS DAILY.   ezetimibe 10 MG tablet Commonly known as:  ZETIA Take 1 tablet (10 mg total) by mouth daily. For cholesterol   fexofenadine 180 MG tablet Commonly known as:  ALLEGRA ALLERGY Take 1 tablet (180 mg total) by mouth daily.   furosemide 20 MG tablet Commonly known as:  LASIX Take 1 tablet (20 mg total) by mouth daily.   gabapentin 300 MG capsule Commonly known as:  NEURONTIN Take 600 mg by mouth 4 (four) times daily.   HYDROcodone-acetaminophen 10-325 MG tablet Commonly known as:  NORCO Take 1 tablet by mouth every 6 (six) hours as needed for moderate pain. Reported on 02/07/2016   metFORMIN 750 MG 24 hr tablet Commonly known as:  GLUCOPHAGE-XR Take 1 tablet (750 mg total) by mouth daily with breakfast.   pantoprazole 40 MG tablet Commonly known as:  PROTONIX TAKE 1 TABLET (40 MG TOTAL) BY MOUTH 2 (TWO) TIMES DAILY. FOR STOMACH   VESICARE 10 MG tablet Generic drug:  solifenacin TAKE 1 TABLET (10 MG TOTAL)  BY MOUTH DAILY.        Follow-up: No Follow-up on file.  [name], M.D.

## 2017-09-09 NOTE — Progress Notes (Signed)
Chief Complaint  Patient presents with  . Cough    pt here today c/o cough and nasal congestion that isn't getting better    HPI  Patient presents today for Patient presents with upper respiratory congestion. Rhinorrhea that is frequently purulent. There is moderate sore throat. Patient reports coughing frequently as well.  Yellow sputum noted. Arlice Colt is is fever to 103, chills and  Sweats also. The patient denies being short of breath. Onset was 3 days ago. Much worse starting night before last when fever, chills hit. PMH: Smoking status noted ROS: Per HPI  Objective: BP 109/62   Pulse 73   Temp 98 F (36.7 C) (Oral)   Ht 5\' 3"  (1.6 m)   Wt 180 lb (81.6 kg)   BMI 31.89 kg/m  Gen: NAD, alert, cooperative with exam HEENT: NCAT, Nasal passages swollen, maxillary sinuses exquisitely tender. TMS clear CV: RRR, good S1/S2, no murmur Resp: Bronchitis changes with scattered wheezes, non-labored Ext: No edema, warm Neuro: Alert and oriented, No gross deficits  Assessment and plan:  1. Cough     Meds ordered this encounter  Medications  . HYDROcodone-homatropine (HYCODAN) 5-1.5 MG/5ML syrup    Sig: Take 5 mLs by mouth every 6 (six) hours as needed for cough.    Dispense:  120 mL    Refill:  0  . levofloxacin (LEVAQUIN) 500 MG tablet    Sig: Take 1 tablet (500 mg total) by mouth daily. For 10 days    Dispense:  10 tablet    Refill:  0    Orders Placed This Encounter  Procedures  . DG Chest 2 View    Standing Status:   Future    Number of Occurrences:   1    Standing Expiration Date:   11/09/2018    Order Specific Question:   Reason for Exam (SYMPTOM  OR DIAGNOSIS REQUIRED)    Answer:   cough    Order Specific Question:   Preferred imaging location?    Answer:   Internal  . CBC with Differential/Platelet    Follow up as needed.  [name], MD

## 2017-09-13 DIAGNOSIS — F419 Anxiety disorder, unspecified: Secondary | ICD-10-CM | POA: Diagnosis not present

## 2017-09-13 DIAGNOSIS — Z79899 Other long term (current) drug therapy: Secondary | ICD-10-CM | POA: Diagnosis not present

## 2017-09-13 DIAGNOSIS — M25572 Pain in left ankle and joints of left foot: Secondary | ICD-10-CM | POA: Diagnosis not present

## 2017-09-13 DIAGNOSIS — F329 Major depressive disorder, single episode, unspecified: Secondary | ICD-10-CM | POA: Diagnosis not present

## 2017-09-13 DIAGNOSIS — S93402A Sprain of unspecified ligament of left ankle, initial encounter: Secondary | ICD-10-CM | POA: Diagnosis not present

## 2017-09-13 DIAGNOSIS — M19072 Primary osteoarthritis, left ankle and foot: Secondary | ICD-10-CM | POA: Diagnosis not present

## 2017-09-13 DIAGNOSIS — W19XXXA Unspecified fall, initial encounter: Secondary | ICD-10-CM | POA: Diagnosis not present

## 2017-09-13 DIAGNOSIS — M7732 Calcaneal spur, left foot: Secondary | ICD-10-CM | POA: Diagnosis not present

## 2017-09-13 DIAGNOSIS — E119 Type 2 diabetes mellitus without complications: Secondary | ICD-10-CM | POA: Diagnosis not present

## 2017-09-13 DIAGNOSIS — S93602A Unspecified sprain of left foot, initial encounter: Secondary | ICD-10-CM | POA: Diagnosis not present

## 2017-09-13 DIAGNOSIS — S99922A Unspecified injury of left foot, initial encounter: Secondary | ICD-10-CM | POA: Diagnosis not present

## 2017-09-13 DIAGNOSIS — I10 Essential (primary) hypertension: Secondary | ICD-10-CM | POA: Diagnosis not present

## 2017-09-18 ENCOUNTER — Ambulatory Visit: Payer: Medicare HMO | Admitting: Family Medicine

## 2017-09-26 ENCOUNTER — Ambulatory Visit: Payer: Medicare HMO | Admitting: Family Medicine

## 2017-09-26 DIAGNOSIS — Z8673 Personal history of transient ischemic attack (TIA), and cerebral infarction without residual deficits: Secondary | ICD-10-CM

## 2017-09-26 HISTORY — DX: Personal history of transient ischemic attack (TIA), and cerebral infarction without residual deficits: Z86.73

## 2017-09-30 ENCOUNTER — Ambulatory Visit (INDEPENDENT_AMBULATORY_CARE_PROVIDER_SITE_OTHER): Payer: Medicare HMO | Admitting: Family Medicine

## 2017-09-30 ENCOUNTER — Encounter: Payer: Self-pay | Admitting: Family Medicine

## 2017-09-30 VITALS — BP 146/82 | HR 70 | Temp 97.4°F | Ht 63.0 in | Wt 182.0 lb

## 2017-09-30 DIAGNOSIS — R05 Cough: Secondary | ICD-10-CM

## 2017-09-30 DIAGNOSIS — R059 Cough, unspecified: Secondary | ICD-10-CM

## 2017-09-30 MED ORDER — ALPRAZOLAM 1 MG PO TABS: 1.0000 mg | ORAL_TABLET | Freq: Three times a day (TID) | ORAL | 5 refills | Status: DC

## 2017-09-30 MED ORDER — BETAMETHASONE SOD PHOS & ACET 6 (3-3) MG/ML IJ SUSP
6.0000 mg | Freq: Once | INTRAMUSCULAR | Status: AC
  Administered 2017-09-30: 6 mg via INTRAMUSCULAR

## 2017-09-30 MED ORDER — HYDROCODONE-HOMATROPINE 5-1.5 MG/5ML PO SYRP: 5.0000 mL | ORAL_SOLUTION | Freq: Four times a day (QID) | ORAL | 0 refills | Status: DC | PRN

## 2017-09-30 MED ORDER — MOXIFLOXACIN HCL 400 MG PO TABS: 400.0000 mg | ORAL_TABLET | Freq: Every day | ORAL | 0 refills | Status: DC

## 2017-09-30 NOTE — Progress Notes (Signed)
Chief Complaint  Patient presents with  . Cough    pt here today c/o cough, congestion and ears stopped up.    HPI  Patient presents today for Patient presents with upper respiratory congestion. Rhinorrhea that is frequently purulent.  Her ears are stopped up.  There is moderate sore throat. Patient reports coughing frequently as well.  Yellow sputum noted. There is no fever, chills or sweats. The patient denies being short of breath. Onset was over 1 month ago. Gradually worsening.  Symptoms have stabilized but never improved in spite of treatment offered at her last visit 1 month ago. PMH: Smoking status noted ROS: Per HPI  Objective: BP (!) 146/82   Pulse 70   Temp (!) 97.4 F (36.3 C) (Oral)   Ht 5\' 3"  (1.6 m)   Wt 182 lb (82.6 kg)   BMI 32.24 kg/m  Gen: NAD, alert, cooperative with exam HEENT: NCAT, Nasal passages swollen, red TMS RED CV: RRR, good S1/S2, no murmur Resp: Bronchitis changes with scattered wheezes, non-labored Ext: No edema, warm Neuro: Alert and oriented, No gross deficits  Assessment and plan:  1. Cough     Meds ordered this encounter  Medications  . betamethasone acetate-betamethasone sodium phosphate (CELESTONE) injection 6 mg  . ALPRAZolam (XANAX) 1 MG tablet    Sig: Take 1 tablet (1 mg total) 3 (three) times daily by mouth.    Dispense:  90 tablet    Refill:  5    Do not fill before 10/29/2016  . HYDROcodone-homatropine (HYCODAN) 5-1.5 MG/5ML syrup    Sig: Take 5 mLs every 6 (six) hours as needed by mouth for cough.    Dispense:  120 mL    Refill:  0  . moxifloxacin (AVELOX) 400 MG tablet    Sig: Take 1 tablet (400 mg total) daily at 8 pm by mouth.    Dispense:  10 tablet    Refill:  0    No orders of the defined types were placed in this encounter.   Follow up as needed.  [name], MD

## 2017-10-01 DIAGNOSIS — M25551 Pain in right hip: Secondary | ICD-10-CM | POA: Diagnosis not present

## 2017-10-01 DIAGNOSIS — M5442 Lumbago with sciatica, left side: Secondary | ICD-10-CM | POA: Diagnosis not present

## 2017-10-01 DIAGNOSIS — Z5181 Encounter for therapeutic drug level monitoring: Secondary | ICD-10-CM | POA: Diagnosis not present

## 2017-10-01 DIAGNOSIS — I10 Essential (primary) hypertension: Secondary | ICD-10-CM | POA: Diagnosis not present

## 2017-10-01 DIAGNOSIS — G894 Chronic pain syndrome: Secondary | ICD-10-CM | POA: Diagnosis not present

## 2017-10-01 DIAGNOSIS — M7918 Myalgia, other site: Secondary | ICD-10-CM | POA: Diagnosis not present

## 2017-10-01 DIAGNOSIS — Z79899 Other long term (current) drug therapy: Secondary | ICD-10-CM | POA: Diagnosis not present

## 2017-10-07 ENCOUNTER — Ambulatory Visit: Payer: Medicare HMO | Admitting: Family Medicine

## 2017-10-22 DIAGNOSIS — J209 Acute bronchitis, unspecified: Secondary | ICD-10-CM | POA: Diagnosis not present

## 2017-10-22 DIAGNOSIS — R05 Cough: Secondary | ICD-10-CM | POA: Diagnosis not present

## 2017-10-24 ENCOUNTER — Ambulatory Visit: Payer: Medicare HMO | Admitting: Neurology

## 2017-10-29 ENCOUNTER — Other Ambulatory Visit: Payer: Self-pay | Admitting: *Deleted

## 2017-10-29 MED ORDER — SOLIFENACIN SUCCINATE 10 MG PO TABS: 10.0000 mg | ORAL_TABLET | Freq: Every day | ORAL | 0 refills | Status: DC

## 2017-11-08 ENCOUNTER — Encounter: Payer: Self-pay | Admitting: Family Medicine

## 2017-11-08 ENCOUNTER — Ambulatory Visit (INDEPENDENT_AMBULATORY_CARE_PROVIDER_SITE_OTHER): Payer: Medicare HMO | Admitting: Family Medicine

## 2017-11-08 VITALS — BP 153/74 | HR 78 | Temp 97.2°F | Ht 63.0 in | Wt 182.4 lb

## 2017-11-08 DIAGNOSIS — H66002 Acute suppurative otitis media without spontaneous rupture of ear drum, left ear: Secondary | ICD-10-CM | POA: Diagnosis not present

## 2017-11-08 MED ORDER — AZITHROMYCIN 250 MG PO TABS: ORAL_TABLET | ORAL | 0 refills | Status: DC

## 2017-11-08 NOTE — Patient Instructions (Signed)
Great to see you!  Finish all antibiotics  Start a pro-biotic twice daily for 2 weeks.    Otitis Media, Adult Otitis media is redness, soreness, and puffiness (swelling) in the space just behind your eardrum (middle ear). It may be caused by allergies or infection. It often happens along with a cold. Follow these instructions at home:  Take your medicine as told. Finish it even if you start to feel better.  Only take over-the-counter or prescription medicines for pain, discomfort, or fever as told by your doctor.  Follow up with your doctor as told. Contact a doctor if:  You have otitis media only in one ear, or bleeding from your nose, or both.  You notice a lump on your neck.  You are not getting better in 3-5 days.  You feel worse instead of better. Get help right away if:  You have pain that is not helped with medicine.  You have puffiness, redness, or pain around your ear.  You get a stiff neck.  You cannot move part of your face (paralysis).  You notice that the bone behind your ear hurts when you touch it. This information is not intended to replace advice given to you by your health care provider. Make sure you discuss any questions you have with your health care provider. Document Released: 04/30/2008 Document Revised: 04/19/2016 Document Reviewed: 06/09/2013 Elsevier Interactive Patient Education  2017 [employer].

## 2017-11-08 NOTE — Progress Notes (Signed)
   HPI  Patient presents today with bilateral ear pain.  Patient has had symptoms for 2 or 3 days, her left ear is more painful than the right.  She states that it sounds like her head is in a bucket in her hearing is slightly reduced.  She is tolerating food and fluids like usual.  She states that she was seen in urgent care about 1 month ago and treated with moxifloxacin, however according to outside records she was seen on 11/she states that she has been off of antibiotics about 1 week.  She is breathing better since her appointment at that time  PMH: Smoking status noted ROS: Per HPI  Objective: BP (!) 153/74 (BP Location: Left Arm, Patient Position: Sitting, Cuff Size: Normal)   Pulse 78   Temp (!) 97.2 F (36.2 C) (Oral)   Ht 5\' 3"  (1.6 m)   Wt 182 lb 6.4 oz (82.7 kg)   BMI 32.31 kg/m  Gen: NAD, alert, cooperative with exam HEENT: NCAT, right TM erythematous but no loss of landmarks, left TM with thick white material present behind the TM, the ossicles are still visible CV: RRR, good S1/S2, no murmur Resp: CTABL, no wheezes, non-labored Ext: No edema, warm Neuro: Alert and oriented, No gross deficits  Assessment and plan:  #Acute separative otitis media of the left ear Treating with azithromycin, this is similar coverage to her recent antibiotic course, however it is a new infection after her antibiotics were gone for several days. Recommended starting probiotics. Return to clinic with any concerns    Meds ordered this encounter  Medications  . azithromycin (ZITHROMAX) 250 MG tablet    Sig: Take 2 tablets on day 1 and 1 tablet daily after that    Dispense:  6 tablet    Refill:  0    Laroy Apple, MD [facility] 11/08/2017, 6:34 PM

## 2017-11-27 ENCOUNTER — Other Ambulatory Visit: Payer: Self-pay | Admitting: *Deleted

## 2017-11-27 DIAGNOSIS — G894 Chronic pain syndrome: Secondary | ICD-10-CM | POA: Diagnosis not present

## 2017-11-27 DIAGNOSIS — M25551 Pain in right hip: Secondary | ICD-10-CM | POA: Diagnosis not present

## 2017-11-27 DIAGNOSIS — M79671 Pain in right foot: Secondary | ICD-10-CM | POA: Diagnosis not present

## 2017-11-27 DIAGNOSIS — M5442 Lumbago with sciatica, left side: Secondary | ICD-10-CM | POA: Diagnosis not present

## 2017-11-27 MED ORDER — AZITHROMYCIN 250 MG PO TABS: ORAL_TABLET | ORAL | 0 refills | Status: DC

## 2017-12-04 ENCOUNTER — Encounter: Payer: Self-pay | Admitting: Physician Assistant

## 2017-12-04 ENCOUNTER — Ambulatory Visit (INDEPENDENT_AMBULATORY_CARE_PROVIDER_SITE_OTHER): Payer: Medicare HMO | Admitting: Physician Assistant

## 2017-12-04 VITALS — BP 178/83 | HR 75 | Temp 97.9°F | Ht 63.0 in | Wt 184.2 lb

## 2017-12-04 DIAGNOSIS — Q181 Preauricular sinus and cyst: Secondary | ICD-10-CM | POA: Diagnosis not present

## 2017-12-04 DIAGNOSIS — J40 Bronchitis, not specified as acute or chronic: Secondary | ICD-10-CM | POA: Diagnosis not present

## 2017-12-04 DIAGNOSIS — K219 Gastro-esophageal reflux disease without esophagitis: Secondary | ICD-10-CM | POA: Diagnosis not present

## 2017-12-04 DIAGNOSIS — L720 Epidermal cyst: Secondary | ICD-10-CM

## 2017-12-04 MED ORDER — PREDNISONE 10 MG (48) PO TBPK: ORAL_TABLET | ORAL | 0 refills | Status: DC

## 2017-12-04 MED ORDER — CEFDINIR 300 MG PO CAPS: 300.0000 mg | ORAL_CAPSULE | Freq: Two times a day (BID) | ORAL | 0 refills | Status: DC
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
## 2017-12-04 NOTE — Patient Instructions (Signed)
In a few days you may receive a survey in the mail or online from [employer] regarding your visit with us today. Please take a moment to fill this out. Your feedback is very important to our whole office. It can help us better understand your needs as well as improve your experience and satisfaction. Thank you for taking your time to complete it. We care about you.  Marsia Cino, PA-C  

## 2017-12-10 ENCOUNTER — Other Ambulatory Visit: Payer: Self-pay | Admitting: Physician Assistant

## 2017-12-10 DIAGNOSIS — R05 Cough: Secondary | ICD-10-CM

## 2017-12-10 DIAGNOSIS — K219 Gastro-esophageal reflux disease without esophagitis: Secondary | ICD-10-CM

## 2017-12-10 DIAGNOSIS — R053 Chronic cough: Secondary | ICD-10-CM

## 2017-12-26 ENCOUNTER — Other Ambulatory Visit: Payer: Self-pay

## 2017-12-26 MED ORDER — FUROSEMIDE 20 MG PO TABS: 20.0000 mg | ORAL_TABLET | Freq: Every day | ORAL | 1 refills | Status: DC

## 2017-12-27 DIAGNOSIS — H6690 Otitis media, unspecified, unspecified ear: Secondary | ICD-10-CM

## 2017-12-27 HISTORY — DX: Otitis media, unspecified, unspecified ear: H66.90

## 2017-12-31 ENCOUNTER — Ambulatory Visit (INDEPENDENT_AMBULATORY_CARE_PROVIDER_SITE_OTHER): Payer: Medicare HMO | Admitting: Family Medicine

## 2017-12-31 ENCOUNTER — Encounter: Payer: Self-pay | Admitting: Family Medicine

## 2017-12-31 ENCOUNTER — Ambulatory Visit (INDEPENDENT_AMBULATORY_CARE_PROVIDER_SITE_OTHER): Payer: Medicare HMO

## 2017-12-31 VITALS — BP 153/80 | HR 77 | Temp 97.7°F | Ht 63.0 in | Wt 187.0 lb

## 2017-12-31 DIAGNOSIS — H66002 Acute suppurative otitis media without spontaneous rupture of ear drum, left ear: Secondary | ICD-10-CM | POA: Diagnosis not present

## 2017-12-31 DIAGNOSIS — M79642 Pain in left hand: Secondary | ICD-10-CM | POA: Diagnosis not present

## 2017-12-31 DIAGNOSIS — M19042 Primary osteoarthritis, left hand: Secondary | ICD-10-CM | POA: Diagnosis not present

## 2017-12-31 DIAGNOSIS — J441 Chronic obstructive pulmonary disease with (acute) exacerbation: Secondary | ICD-10-CM

## 2017-12-31 DIAGNOSIS — E785 Hyperlipidemia, unspecified: Secondary | ICD-10-CM

## 2017-12-31 DIAGNOSIS — E119 Type 2 diabetes mellitus without complications: Secondary | ICD-10-CM

## 2017-12-31 DIAGNOSIS — I10 Essential (primary) hypertension: Secondary | ICD-10-CM | POA: Diagnosis not present

## 2017-12-31 DIAGNOSIS — M19041 Primary osteoarthritis, right hand: Secondary | ICD-10-CM | POA: Diagnosis not present

## 2017-12-31 LAB — BAYER DCA HB A1C WAIVED: HB A1C (BAYER DCA - WAIVED): 7.8 % — ABNORMAL HIGH (ref <7.0)

## 2017-12-31 MED ORDER — SOLIFENACIN SUCCINATE 10 MG PO TABS: 10.0000 mg | ORAL_TABLET | Freq: Every day | ORAL | 0 refills | Status: DC

## 2017-12-31 MED ORDER — LEVOFLOXACIN 500 MG PO TABS: 500.0000 mg | ORAL_TABLET | Freq: Every day | ORAL | 0 refills | Status: DC

## 2017-12-31 MED ORDER — METFORMIN HCL ER 750 MG PO TB24: 750.0000 mg | ORAL_TABLET | Freq: Every day | ORAL | 2 refills | Status: DC

## 2017-12-31 MED ORDER — ATORVASTATIN CALCIUM 20 MG PO TABS: ORAL_TABLET | ORAL | 1 refills | Status: DC

## 2017-12-31 MED ORDER — FLUTICASONE FUROATE-VILANTEROL 100-25 MCG/INH IN AEPB: 1.0000 | INHALATION_SPRAY | Freq: Every day | RESPIRATORY_TRACT | 1 refills | Status: DC

## 2017-12-31 MED ORDER — AMLODIPINE BESYLATE 5 MG PO TABS: ORAL_TABLET | ORAL | 1 refills | Status: DC

## 2017-12-31 MED ORDER — PANTOPRAZOLE SODIUM 40 MG PO TBEC: 40.0000 mg | DELAYED_RELEASE_TABLET | Freq: Two times a day (BID) | ORAL | 5 refills | Status: DC

## 2017-12-31 MED ORDER — BETAMETHASONE SOD PHOS & ACET 6 (3-3) MG/ML IJ SUSP
6.0000 mg | Freq: Once | INTRAMUSCULAR | Status: AC
  Administered 2017-12-31: 6 mg via INTRAMUSCULAR

## 2017-12-31 MED ORDER — ALBUTEROL SULFATE HFA 108 (90 BASE) MCG/ACT IN AERS: 2.0000 | INHALATION_SPRAY | Freq: Four times a day (QID) | RESPIRATORY_TRACT | 5 refills | Status: DC | PRN

## 2017-12-31 MED ORDER — GABAPENTIN 300 MG PO CAPS: 600.0000 mg | ORAL_CAPSULE | Freq: Four times a day (QID) | ORAL | 5 refills | Status: DC

## 2017-12-31 NOTE — Progress Notes (Signed)
Subjective:  Patient ID: Yvonne [name],  female    DOB: 1950-12-31  Age: 67 y.o.    CC: Follow-up (pt here today for routine follow up of her chronic medical conditions and also c/o ears stopped up and cold, congestion.)   HPI SHONDELL FABEL presents for  follow-up of hypertension. Patient has no history of headache chest pain or shortness of breath or recent cough. Patient also denies symptoms of TIA such as numbness weakness lateralizing. Patient checks  blood pressure at home. Recent readings have been good Patient denies side effects from medication. States taking it regularly.  Patient also  in for follow-up of elevated cholesterol. Doing well without complaints on current medication. Denies side effects of statin including myalgia and arthralgia and nausea. Also in today for liver function testing. Currently no chest pain, shortness of breath or other cardiovascular related symptoms noted.  Follow-up of diabetes. Patient does check blood sugar at home. Readings run between 110 fasting and 130 postprandial Patient denies symptoms such as polyuria, polydipsia, excessive hunger, nausea No significant hypoglycemic spells noted. Medications reviewed. Pt reports taking them regularly. Pt. denies complication/adverse reaction today.   Patient reports that her left ear is still bothering her.  It has never really remitted in spite of treatment for about 4 months now.  She does have an appointment with an ENT to see it on the 18th of this month.  From previous referral made on the previous visit.  Pain is moderate hearing is not good it is debatable whether the hearing was not good prior to the onset of this infection.  She cannot really tell whether the hearing is worse now or not.  Meanwhile things have been made worse by a week of coughing and congestion some purulent productivity.  No fever chills or sweats.  Somewhat dyspneic but not having it significant impact on her. History Mischa has a  past medical history of Anxiety, Asthma, Cataract, Chronic bronchitis ([place]), Chronic headaches, Chronic leg pain, Clavicle fracture, Diabetes mellitus without complication ([place]), H/O seasonal allergies, Hyperlipidemia, Hypertension, Ischemic stroke ([place]), Overactive bladder, and Stroke ([place]).   She has a past surgical history that includes Abdominal hysterectomy; Cholecystectomy; tear duct surgery (Bilateral); Colonoscopy; Colonoscopy (N/A, 08/17/2015); and Cataract extraction.   Her family history includes Arthritis in her brother and mother; Asthma in her father; CVA in her mother; Cancer in her brother; Diabetes in her father and mother; Heart disease in her father; Hepatitis C in her sister; Stroke in her mother.She reports that  has never smoked. she has never used smokeless tobacco. She reports that she does not drink alcohol or use drugs.  Current Outpatient Medications on File Prior to Visit  Medication Sig Dispense Refill  . ALPRAZolam (XANAX) 1 MG tablet Take 1 tablet (1 mg total) 3 (three) times daily by mouth. 90 tablet 5  . aspirin 325 MG tablet Take 1 tablet (325 mg total) by mouth daily. 30 tablet 0  . ezetimibe (ZETIA) 10 MG tablet Take 1 tablet (10 mg total) by mouth daily. For cholesterol 90 tablet 3  . fexofenadine (ALLEGRA ALLERGY) 180 MG tablet Take 1 tablet (180 mg total) by mouth daily. 30 tablet 5  . furosemide (LASIX) 20 MG tablet Take 1 tablet (20 mg total) by mouth daily. 90 tablet 1  . HYDROcodone-acetaminophen (NORCO) 10-325 MG tablet Take 1 tablet by mouth every 6 (six) hours as needed for moderate pain. Reported on 02/07/2016 (Patient taking differently: Take 1 tablet by  mouth every 8 (eight) hours as needed for moderate pain. Reported on 02/07/2016) 60 tablet 0   No current facility-administered medications on file prior to visit.     ROS Review of Systems  Constitutional: Negative for activity change, appetite change and fever.  HENT: Positive for ear pain. Negative  for congestion, rhinorrhea and sore throat.   Eyes: Negative for visual disturbance.  Respiratory: Positive for cough and shortness of breath.   Cardiovascular: Negative for chest pain and palpitations.  Gastrointestinal: Negative for abdominal pain, diarrhea and nausea.  Genitourinary: Negative for dysuria.  Musculoskeletal: Positive for arthralgias (She fell about a week ago.  She landed on her left hand.  It is still painful and hard to use it.). Negative for myalgias.    Objective:  BP (!) 153/80   Pulse 77   Temp 97.7 F (36.5 C) (Oral)   Ht _0  (1.6 m)   Wt 187 lb (84.8 kg)   BMI 33.13 kg/m   BP Readings from Last 3 Encounters:  12/31/17 (!) 153/80  12/04/17 (!) 178/83  11/08/17 (!) 153/74    Wt Readings from Last 3 Encounters:  12/31/17 187 lb (84.8 kg)  12/04/17 184 lb 3.2 oz (83.6 kg)  11/08/17 182 lb 6.4 oz (82.7 kg)     Physical Exam  Constitutional: She is oriented to person, place, and time. She appears well-developed and well-nourished. No distress.  HENT:  Head: Normocephalic and atraumatic.  Right Ear: External ear normal.  Left Ear: External ear normal.  Nose: Nose normal.  Mouth/Throat: Oropharynx is clear and moist.  Eyes: Conjunctivae and EOM are normal. Pupils are equal, round, and reactive to light.  Neck: Normal range of motion. Neck supple. No thyromegaly present.  Cardiovascular: Normal rate, regular rhythm and normal heart sounds.  No murmur heard. Pulmonary/Chest: Effort normal. No respiratory distress. She has wheezes (Few scattered rhonchi). She has no rales.  Abdominal: Soft. Bowel sounds are normal. She exhibits no distension. There is no tenderness.  Musculoskeletal: She exhibits tenderness ([place] of left hand.  Decreased strength for grip but full range of motion). She exhibits no deformity.  Lymphadenopathy:    She has no cervical adenopathy.  Neurological: She is alert and oriented to person, place, and time. She has normal  reflexes.  Skin: Skin is warm and dry.  Psychiatric: She has a normal mood and affect. Her behavior is normal. Judgment and thought content normal.     Assessment & Plan:   Akeiba was seen today for follow-up.  Diagnoses and all orders for this visit:  Type 2 diabetes mellitus without complication, without long-term current use of insulin (HCC) -     Bayer DCA Hb A1c Waived -     metFORMIN (GLUCOPHAGE-XR) 750 MG 24 hr tablet; Take 1 tablet (750 mg total) by mouth daily with breakfast.  Essential hypertension -     CBC with Differential/Platelet -     CMP14+EGFR  Hyperlipidemia with target LDL less than 70 -     Lipid panel  Acute suppurative otitis media of left ear without spontaneous rupture of tympanic membrane, recurrence not specified  Pain in left hand -     DG Hand Complete Left; Future  COPD exacerbation (HCC) -     betamethasone acetate-betamethasone sodium phosphate (CELESTONE) injection 6 mg -     albuterol (VENTOLIN HFA) 108 (90 Base) MCG/ACT inhaler; Inhale 2 puffs into the lungs every 6 (six) hours as needed for wheezing or shortness of breath. -  fluticasone furoate-vilanterol (BREO ELLIPTA) 100-25 MCG/INH AEPB; Inhale 1 puff into the lungs daily.  Other orders -     levofloxacin (LEVAQUIN) 500 MG tablet; Take 1 tablet (500 mg total) by mouth daily. -     amLODipine (NORVASC) 5 MG tablet; TAKE 1 TABLET (5 MG TOTAL) BY MOUTH DAILY. -     atorvastatin (LIPITOR) 20 MG tablet; TAKE 1 TABLET EVERY DAY AT 6PM -     gabapentin (NEURONTIN) 300 MG capsule; Take 2 capsules (600 mg total) by mouth 4 (four) times daily. -     pantoprazole (PROTONIX) 40 MG tablet; Take 1 tablet (40 mg total) by mouth 2 (two) times daily. For stomach -     solifenacin (VESICARE) 10 MG tablet; Take 1 tablet (10 mg total) by mouth daily.   I have discontinued [name] Dicola's HYDROcodone-homatropine, predniSONE, and cefdinir. I have changed her BREO ELLIPTA to fluticasone  furoate-vilanterol. I have also changed her gabapentin. Additionally, I am having her start on levofloxacin. Lastly, I am having her maintain her HYDROcodone-acetaminophen, aspirin, ezetimibe, fexofenadine, ALPRAZolam, furosemide, albuterol, metFORMIN, amLODipine, atorvastatin, pantoprazole, and solifenacin. We will continue to administer betamethasone acetate-betamethasone sodium phosphate.  Meds ordered this encounter  Medications  . betamethasone acetate-betamethasone sodium phosphate (CELESTONE) injection 6 mg  . levofloxacin (LEVAQUIN) 500 MG tablet    Sig: Take 1 tablet (500 mg total) by mouth daily.    Dispense:  10 tablet    Refill:  0  . albuterol (VENTOLIN HFA) 108 (90 Base) MCG/ACT inhaler    Sig: Inhale 2 puffs into the lungs every 6 (six) hours as needed for wheezing or shortness of breath.    Dispense:  18 Inhaler    Refill:  5  . fluticasone furoate-vilanterol (BREO ELLIPTA) 100-25 MCG/INH AEPB    Sig: Inhale 1 puff into the lungs daily.    Dispense:  180 each    Refill:  1  . metFORMIN (GLUCOPHAGE-XR) 750 MG 24 hr tablet    Sig: Take 1 tablet (750 mg total) by mouth daily with breakfast.    Dispense:  30 tablet    Refill:  2  . amLODipine (NORVASC) 5 MG tablet    Sig: TAKE 1 TABLET (5 MG TOTAL) BY MOUTH DAILY.    Dispense:  90 tablet    Refill:  1  . atorvastatin (LIPITOR) 20 MG tablet    Sig: TAKE 1 TABLET EVERY DAY AT 6PM    Dispense:  90 tablet    Refill:  1  . gabapentin (NEURONTIN) 300 MG capsule    Sig: Take 2 capsules (600 mg total) by mouth 4 (four) times daily.    Dispense:  480 capsule    Refill:  5  . pantoprazole (PROTONIX) 40 MG tablet    Sig: Take 1 tablet (40 mg total) by mouth 2 (two) times daily. For stomach    Dispense:  60 tablet    Refill:  5  . solifenacin (VESICARE) 10 MG tablet    Sig: Take 1 tablet (10 mg total) by mouth daily.    Dispense:  90 tablet    Refill:  0     Follow-up: Return in about 3 months (around 03/30/2018), or if  symptoms worsen or fail to improve.  [name], M.D.

## 2018-01-01 ENCOUNTER — Other Ambulatory Visit: Payer: Self-pay | Admitting: Family Medicine

## 2018-01-01 ENCOUNTER — Other Ambulatory Visit: Payer: Self-pay | Admitting: *Deleted

## 2018-01-01 ENCOUNTER — Telehealth: Payer: Self-pay | Admitting: Family Medicine

## 2018-01-01 LAB — CBC WITH DIFFERENTIAL/PLATELET
BASOS: 0 %
Basophils Absolute: 0 10*3/uL (ref 0.0–0.2)
EOS (ABSOLUTE): 0.2 10*3/uL (ref 0.0–0.4)
EOS: 3 %
HEMATOCRIT: 36.8 % (ref 34.0–46.6)
HEMOGLOBIN: 12.6 g/dL (ref 11.1–15.9)
IMMATURE GRANS (ABS): 0 10*3/uL (ref 0.0–0.1)
IMMATURE GRANULOCYTES: 0 %
Lymphocytes Absolute: 2.2 10*3/uL (ref 0.7–3.1)
Lymphs: 35 %
MCH: 28.9 pg (ref 26.6–33.0)
MCHC: 34.2 g/dL (ref 31.5–35.7)
MCV: 84 fL (ref 79–97)
MONOCYTES: 8 %
Monocytes Absolute: 0.5 10*3/uL (ref 0.1–0.9)
NEUTROS PCT: 54 %
Neutrophils Absolute: 3.3 10*3/uL (ref 1.4–7.0)
Platelets: 230 10*3/uL (ref 150–379)
RBC: 4.36 x10E6/uL (ref 3.77–5.28)
RDW: 13.7 % (ref 12.3–15.4)
WBC: 6.2 10*3/uL (ref 3.4–10.8)

## 2018-01-01 LAB — LIPID PANEL
Chol/HDL Ratio: 4.8 ratio — ABNORMAL HIGH (ref 0.0–4.4)
Cholesterol, Total: 229 mg/dL — ABNORMAL HIGH (ref 100–199)
HDL: 48 mg/dL (ref >39)
LDL CALC: 134 mg/dL — AB (ref 0–99)
TRIGLYCERIDES: 233 mg/dL — AB (ref 0–149)
VLDL Cholesterol Cal: 47 mg/dL — ABNORMAL HIGH (ref 5–40)

## 2018-01-01 LAB — CMP14+EGFR
ALBUMIN: 3.9 g/dL (ref 3.6–4.8)
ALT: 20 IU/L (ref 0–32)
AST: 21 IU/L (ref 0–40)
Albumin/Globulin Ratio: 1.7 (ref 1.2–2.2)
Alkaline Phosphatase: 112 IU/L (ref 39–117)
BUN/Creatinine Ratio: 16 (ref 12–28)
BUN: 13 mg/dL (ref 8–27)
Bilirubin Total: 0.3 mg/dL (ref 0.0–1.2)
CO2: 26 mmol/L (ref 20–29)
CREATININE: 0.83 mg/dL (ref 0.57–1.00)
Calcium: 9.1 mg/dL (ref 8.7–10.3)
Chloride: 101 mmol/L (ref 96–106)
GFR calc Af Amer: 84 mL/min/{1.73_m2} (ref >59)
GFR calc non Af Amer: 73 mL/min/{1.73_m2} (ref >59)
GLOBULIN, TOTAL: 2.3 g/dL (ref 1.5–4.5)
Glucose: 209 mg/dL — ABNORMAL HIGH (ref 65–99)
Potassium: 4.1 mmol/L (ref 3.5–5.2)
SODIUM: 142 mmol/L (ref 134–144)
TOTAL PROTEIN: 6.2 g/dL (ref 6.0–8.5)

## 2018-01-01 MED ORDER — GLUCOSE BLOOD VI STRP: ORAL_STRIP | 12 refills | Status: DC

## 2018-01-01 MED ORDER — ATORVASTATIN CALCIUM 40 MG PO TABS: ORAL_TABLET | ORAL | 1 refills | Status: DC

## 2018-01-01 MED ORDER — LANCETS ULTRA THIN MISC: 99 refills | Status: DC

## 2018-01-01 MED ORDER — BLOOD GLUCOSE METER KIT: PACK | 0 refills | Status: DC

## 2018-01-01 NOTE — Telephone Encounter (Signed)
Pt aware.

## 2018-01-08 ENCOUNTER — Telehealth: Payer: Self-pay

## 2018-01-08 NOTE — Telephone Encounter (Signed)
Unable to leave a message.  [facility] mail is not set up.

## 2018-01-13 ENCOUNTER — Ambulatory Visit (INDEPENDENT_AMBULATORY_CARE_PROVIDER_SITE_OTHER): Payer: Medicare HMO | Admitting: Otolaryngology

## 2018-01-13 DIAGNOSIS — R05 Cough: Secondary | ICD-10-CM

## 2018-01-13 DIAGNOSIS — H906 Mixed conductive and sensorineural hearing loss, bilateral: Secondary | ICD-10-CM

## 2018-01-13 DIAGNOSIS — H6983 Other specified disorders of Eustachian tube, bilateral: Secondary | ICD-10-CM

## 2018-01-15 ENCOUNTER — Other Ambulatory Visit: Payer: Self-pay | Admitting: Otolaryngology

## 2018-01-15 ENCOUNTER — Telehealth: Payer: Self-pay

## 2018-01-15 NOTE — Telephone Encounter (Signed)
[facility] - left message.  

## 2018-01-22 ENCOUNTER — Other Ambulatory Visit: Payer: Self-pay | Admitting: Family Medicine

## 2018-01-22 ENCOUNTER — Telehealth: Payer: Self-pay

## 2018-01-22 ENCOUNTER — Encounter (HOSPITAL_COMMUNITY): Payer: Self-pay | Admitting: Psychiatry

## 2018-01-22 DIAGNOSIS — F418 Other specified anxiety disorders: Secondary | ICD-10-CM

## 2018-01-22 DIAGNOSIS — M79671 Pain in right foot: Secondary | ICD-10-CM | POA: Diagnosis not present

## 2018-01-22 DIAGNOSIS — Z79899 Other long term (current) drug therapy: Secondary | ICD-10-CM | POA: Diagnosis not present

## 2018-01-22 DIAGNOSIS — Z5181 Encounter for therapeutic drug level monitoring: Secondary | ICD-10-CM | POA: Diagnosis not present

## 2018-01-22 DIAGNOSIS — I10 Essential (primary) hypertension: Secondary | ICD-10-CM | POA: Diagnosis not present

## 2018-01-22 DIAGNOSIS — G894 Chronic pain syndrome: Secondary | ICD-10-CM | POA: Diagnosis not present

## 2018-01-22 DIAGNOSIS — M47816 Spondylosis without myelopathy or radiculopathy, lumbar region: Secondary | ICD-10-CM | POA: Diagnosis not present

## 2018-01-22 DIAGNOSIS — M5442 Lumbago with sciatica, left side: Secondary | ICD-10-CM | POA: Diagnosis not present

## 2018-01-22 DIAGNOSIS — Z1231 Encounter for screening mammogram for malignant neoplasm of breast: Secondary | ICD-10-CM
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
## 2018-01-22 NOTE — Telephone Encounter (Signed)
This encounter was created in error - please disregard.
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
## 2018-01-23 ENCOUNTER — Other Ambulatory Visit: Payer: Self-pay

## 2018-01-23 ENCOUNTER — Encounter (HOSPITAL_BASED_OUTPATIENT_CLINIC_OR_DEPARTMENT_OTHER): Payer: Self-pay | Admitting: *Deleted

## 2018-01-28 ENCOUNTER — Other Ambulatory Visit: Payer: Self-pay

## 2018-01-28 ENCOUNTER — Encounter (HOSPITAL_BASED_OUTPATIENT_CLINIC_OR_DEPARTMENT_OTHER)
Admission: RE | Disposition: A | Discharge status: Home / Self Care | Payer: Self-pay | Source: Ambulatory Visit | Attending: Otolaryngology

## 2018-01-28 ENCOUNTER — Encounter (HOSPITAL_BASED_OUTPATIENT_CLINIC_OR_DEPARTMENT_OTHER): Payer: Self-pay | Admitting: *Deleted

## 2018-01-28 ENCOUNTER — Ambulatory Visit (HOSPITAL_BASED_OUTPATIENT_CLINIC_OR_DEPARTMENT_OTHER)
Admission: RE | Admit: 2018-01-28 | Discharge: 2018-01-28 | Disposition: A | Discharge status: Home / Self Care | Payer: Medicare HMO | Source: Ambulatory Visit | Attending: Otolaryngology | Admitting: Otolaryngology

## 2018-01-28 ENCOUNTER — Ambulatory Visit (HOSPITAL_BASED_OUTPATIENT_CLINIC_OR_DEPARTMENT_OTHER): Payer: Medicare HMO | Admitting: Anesthesiology

## 2018-01-28 DIAGNOSIS — Z7984 Long term (current) use of oral hypoglycemic drugs: Secondary | ICD-10-CM | POA: Insufficient documentation

## 2018-01-28 DIAGNOSIS — Z8673 Personal history of transient ischemic attack (TIA), and cerebral infarction without residual deficits: Secondary | ICD-10-CM | POA: Insufficient documentation

## 2018-01-28 DIAGNOSIS — H6993 Unspecified Eustachian tube disorder, bilateral: Secondary | ICD-10-CM | POA: Diagnosis not present

## 2018-01-28 DIAGNOSIS — I10 Essential (primary) hypertension: Secondary | ICD-10-CM | POA: Diagnosis not present

## 2018-01-28 DIAGNOSIS — H748X3 Other specified disorders of middle ear and mastoid, bilateral: Secondary | ICD-10-CM | POA: Diagnosis not present

## 2018-01-28 DIAGNOSIS — R05 Cough: Secondary | ICD-10-CM | POA: Diagnosis present

## 2018-01-28 DIAGNOSIS — H902 Conductive hearing loss, unspecified: Secondary | ICD-10-CM | POA: Insufficient documentation

## 2018-01-28 DIAGNOSIS — J449 Chronic obstructive pulmonary disease, unspecified: Secondary | ICD-10-CM | POA: Insufficient documentation

## 2018-01-28 DIAGNOSIS — E119 Type 2 diabetes mellitus without complications: Secondary | ICD-10-CM | POA: Insufficient documentation

## 2018-01-28 DIAGNOSIS — H6983 Other specified disorders of Eustachian tube, bilateral: Secondary | ICD-10-CM | POA: Diagnosis not present

## 2018-01-28 DIAGNOSIS — H6523 Chronic serous otitis media, bilateral: Secondary | ICD-10-CM | POA: Diagnosis not present

## 2018-01-28 DIAGNOSIS — K219 Gastro-esophageal reflux disease without esophagitis: Secondary | ICD-10-CM | POA: Diagnosis not present

## 2018-01-28 HISTORY — DX: Type 2 diabetes mellitus without complications: E11.9

## 2018-01-28 HISTORY — DX: Presence of dental prosthetic device (complete) (partial): Z97.2

## 2018-01-28 HISTORY — DX: Cough: R05

## 2018-01-28 HISTORY — DX: Gastro-esophageal reflux disease without esophagitis: K21.9

## 2018-01-28 HISTORY — PX: MYRINGOTOMY WITH TUBE PLACEMENT: SHX5663

## 2018-01-28 HISTORY — DX: Unspecified osteoarthritis, unspecified site: M19.90

## 2018-01-28 HISTORY — DX: Complete loss of teeth, unspecified cause, unspecified class: K08.109

## 2018-01-28 HISTORY — DX: Personal history of transient ischemic attack (TIA), and cerebral infarction without residual deficits: Z86.73

## 2018-01-28 HISTORY — DX: Chronic cough: R05.3

## 2018-01-28 HISTORY — DX: Otitis media, unspecified, unspecified ear: H66.90

## 2018-01-28 LAB — GLUCOSE, CAPILLARY
GLUCOSE-CAPILLARY: 149 mg/dL — AB (ref 65–99)
Glucose-Capillary: 121 mg/dL — ABNORMAL HIGH (ref 65–99)

## 2018-01-28 SURGERY — MYRINGOTOMY WITH TUBE PLACEMENT
Anesthesia: General | Laterality: Bilateral

## 2018-01-28 MED ORDER — MEPERIDINE HCL 25 MG/ML IJ SOLN: 6.2500 mg | INTRAMUSCULAR | Status: DC | PRN

## 2018-01-28 MED ORDER — ONDANSETRON HCL 4 MG/2ML IJ SOLN
INTRAMUSCULAR | Status: DC | PRN
  Administered 2018-01-28: 4 mg via INTRAVENOUS

## 2018-01-28 MED ORDER — MIDAZOLAM HCL 2 MG/2ML IJ SOLN: 1.0000 mg | INTRAMUSCULAR | Status: DC | PRN

## 2018-01-28 MED ORDER — LACTATED RINGERS IV SOLN
INTRAVENOUS | Status: DC
  Administered 2018-01-28 (×2): via INTRAVENOUS

## 2018-01-28 MED ORDER — SCOPOLAMINE 1 MG/3DAYS TD PT72: 1.0000 | MEDICATED_PATCH | Freq: Once | TRANSDERMAL | Status: DC | PRN

## 2018-01-28 MED ORDER — LACTATED RINGERS IV SOLN: INTRAVENOUS | Status: DC

## 2018-01-28 MED ORDER — PROMETHAZINE HCL 25 MG/ML IJ SOLN: 6.2500 mg | INTRAMUSCULAR | Status: DC | PRN

## 2018-01-28 MED ORDER — PROPOFOL 10 MG/ML IV BOLUS
INTRAVENOUS | Status: AC
  Filled 2018-01-28: qty 40

## 2018-01-28 MED ORDER — CIPROFLOXACIN-FLUOCINOLONE PF 0.3-0.025 % OT SOLN
OTIC | Status: DC | PRN
  Administered 2018-01-28 (×2): 0.25 mL via OTIC

## 2018-01-28 MED ORDER — ONDANSETRON HCL 4 MG/2ML IJ SOLN
INTRAMUSCULAR | Status: AC
  Filled 2018-01-28: qty 2

## 2018-01-28 MED ORDER — DEXAMETHASONE SODIUM PHOSPHATE 4 MG/ML IJ SOLN
INTRAMUSCULAR | Status: DC | PRN
  Administered 2018-01-28: 5 mg via INTRAVENOUS

## 2018-01-28 MED ORDER — PROPOFOL 10 MG/ML IV BOLUS
INTRAVENOUS | Status: DC | PRN
Start: 2018-01-28 — End: 2018-01-28
  Administered 2018-01-28: 80 mg via INTRAVENOUS

## 2018-01-28 MED ORDER — FENTANYL CITRATE (PF) 100 MCG/2ML IJ SOLN
INTRAMUSCULAR | Status: AC
  Filled 2018-01-28: qty 2

## 2018-01-28 MED ORDER — LIDOCAINE 2% (20 MG/ML) 5 ML SYRINGE
INTRAMUSCULAR | Status: AC
  Filled 2018-01-28: qty 5

## 2018-01-28 MED ORDER — DEXAMETHASONE SODIUM PHOSPHATE 10 MG/ML IJ SOLN
INTRAMUSCULAR | Status: AC
  Filled 2018-01-28: qty 1

## 2018-01-28 MED ORDER — FENTANYL CITRATE (PF) 100 MCG/2ML IJ SOLN
50.0000 ug | INTRAMUSCULAR | Status: DC | PRN
  Administered 2018-01-28: 50 ug via INTRAVENOUS

## 2018-01-28 SURGICAL SUPPLY — 13 items
BLADE MYRINGOTOMY 45DEG STRL (BLADE) ×2 IMPLANT
CANISTER SUCT 1200ML W/VALVE (MISCELLANEOUS) ×2 IMPLANT
COTTONBALL LRG STERILE PKG (GAUZE/BANDAGES/DRESSINGS) ×2 IMPLANT
GAUZE SPONGE 4X4 12PLY STRL LF (GAUZE/BANDAGES/DRESSINGS) IMPLANT
GLOVE BIO SURGEON STRL SZ7 (GLOVE) ×2 IMPLANT
GLOVE BIOGEL PI IND STRL 6.5 (GLOVE) ×1 IMPLANT
GLOVE BIOGEL PI INDICATOR 6.5 (GLOVE) ×1
IV SET EXT 30 76VOL 4 MALE LL (IV SETS) ×2 IMPLANT
NS IRRIG 1000ML POUR BTL (IV SOLUTION) IMPLANT
TOWEL OR 17X24 6PK STRL BLUE (TOWEL DISPOSABLE) ×2 IMPLANT
TUBE CONNECTING 20X1/4 (TUBING) ×2 IMPLANT
TUBE EAR SHEEHY BUTTON 1.27 (OTOLOGIC RELATED) IMPLANT
TUBE EAR T MOD 1.32X4.8 BL (OTOLOGIC RELATED) ×4 IMPLANT

## 2018-01-28 NOTE — Anesthesia Procedure Notes (Signed)
Procedure Name: General with mask airway Performed by: Verita Lamb, CRNA Pre-anesthesia Checklist: Patient identified, Emergency Drugs available, Suction available, Patient being monitored and Timeout performed Patient Re-evaluated:Patient Re-evaluated prior to induction Preoxygenation: Pre-oxygenation with 100% oxygen Induction Type: IV induction and Inhalational induction Ventilation: Mask ventilation without difficulty and Oral airway inserted - appropriate to patient size Placement Confirmation: positive ETCO2,  CO2 detector and breath sounds checked- equal and bilateral Dental Injury: Teeth and Oropharynx as per pre-operative assessment

## 2018-01-28 NOTE — Discharge Instructions (Addendum)
POSTOPERATIVE INSTRUCTIONS FOR PATIENTS HAVING MYRINGOTOMY AND TUBES  1. Please use the ear drops in each ear with a new tube as instructed. Use the drops as prescribed by your doctor, placing the drops into the outer opening of the ear canal with the head tilted to the opposite side. Place a clean piece of cotton into the ear after using drops. A small amount of blood tinged drainage is not uncommon for several days after the tubes are inserted. 2. Nausea and vomiting may be expected the first 6 hours after surgery. Offer liquids initially. If there is no nausea, small light meals are usually best tolerated the day of surgery. A normal diet may be resumed once nausea has passed. 3. The patient may experience mild ear discomfort the day of surgery, which is usually relieved by Tylenol. 4. A small amount of clear or blood-tinged drainage from the ears may occur a few days after surgery. If this should persists or become thick, green, yellow, or foul smelling, please contact our office at [phone]. 5. If you see clear, green, or yellow drainage from your childs ear during colds, clean the outer ear gently with a soft, damp washcloth. Begin the prescribed ear drops (4 drops, twice a day) for one week, as previously instructed.  The drainage should stop within 48 hours after starting the ear drops. If the drainage continues or becomes yellow or green, please call our office. If your child develops a fever greater than 102 F, or has and persistent bleeding from the ear(s), please call us. 6. Try to avoid getting water in the ears. Swimming is permitted as long as there is no deep diving or swimming under water deeper than 3 feet. If you think water has gotten into the ear(s), either bathing or swimming, place 4 drops of the prescribed ear drops into the ear in question. We do recommend drops after swimming in the ocean, rivers, or lakes. 7. It is important for you to return for your scheduled appointment  so that the status of the tubes can be determined.     Post Anesthesia Home Care Instructions  Activity: Get plenty of rest for the remainder of the day. A responsible individual must stay with you for 24 hours following the procedure.  For the next 24 hours, DO NOT: -Drive a car -Paediatric nurse -Drink alcoholic beverages -Take any medication unless instructed by your physician -Make any legal decisions or sign important papers.  Meals: Start with liquid foods such as gelatin or soup. Progress to regular foods as tolerated. Avoid greasy, spicy, heavy foods. If nausea and/or vomiting occur, drink only clear liquids until the nausea and/or vomiting subsides. Call your physician if vomiting continues.  Special Instructions/Symptoms: Your throat may feel dry or sore from the anesthesia or the breathing tube placed in your throat during surgery. If this causes discomfort, gargle with warm salt water. The discomfort should disappear within 24 hours.  If you had a scopolamine patch placed behind your ear for the management of post- operative nausea and/or vomiting:  1. The medication in the patch is effective for 72 hours, after which it should be removed.  Wrap patch in a tissue and discard in the trash. Wash hands thoroughly with soap and water. 2. You may remove the patch earlier than 72 hours if you experience unpleasant side effects which may include dry mouth, dizziness or visual disturbances. 3. Avoid touching the patch. Wash your hands with soap and water after contact with  the patch.     Call your surgeon if you experience:   1.  Fever over 101.0. 2.  Inability to urinate. 3.  Nausea and/or vomiting. 4.  Extreme swelling or bruising at the surgical site. 5.  Continued bleeding from the incision. 6.  Increased pain, redness or drainage from the incision. 7.  Problems related to your pain medication. 8.  Any problems and/or concerns

## 2018-01-28 NOTE — Transfer of Care (Signed)
Immediate Anesthesia Transfer of Care Note  Patient: Yvonne [name]  Procedure(s) Performed: BILATERAL MYRINGOTOMY WITH TUBE PLACEMENT (Bilateral )  Patient Location: PACU  Anesthesia Type:General  Level of Consciousness: awake, alert  and oriented  Airway & Oxygen Therapy: Patient Spontanous Breathing and Patient connected to face mask oxygen  Post-op Assessment: Report given to RN and Post -op Vital signs reviewed and stable  Post vital signs: Reviewed and stable  Last Vitals:  Vitals:   01/28/18 0641 01/28/18 0819  BP: (!) 154/75   Pulse: 73 (!) 57  Resp: 16 (!) 8  Temp: 36.6 C   SpO2: 96% 100%    Last Pain:  Vitals:   01/28/18 0641  TempSrc: Oral         Complications: No apparent anesthesia complications

## 2018-01-28 NOTE — H&P (Signed)
Cc: Middle ear effusion, chronic cough  HPI: The patient is a 67 y/o female who presents today for evaluation of middle ear effusion and chronic cough. The patient is seen in consultation requested by [place]. The patient has noted a cough for the past 5-6 years. She also complains of constant throat clearing and globus sensation. The patient has a history of reflux and is currently on pantoprazole and pepcid. She has tried multiple cough medications with no relief. The patient is also on high dose neurontin for her neuropathy and this has not improved her cough either. The patient denies dysphagia or odynophagia. She has noted persistent hoarseness. The patient also complains today of persistent ear clogging and muffled hearing. Her ear symptoms began over 5 months ago. She was treated with acute otitis media. The patient has been on several courses of antibiotics and steroids with no improvement. The patient is unable to use steroid nasal sprays. She does have difficulty performing the Valsalva exercise to autoinsufflate her middle ear spaces. The patient denies any previous otologic issues. Tobacco use is denied. No previous ENT surgery is noted.   The patient's review of systems (constitutional, eyes, ENT, cardiovascular, respiratory, GI, musculoskeletal, skin, neurologic, psychiatric, endocrine, hematologic, allergic) is noted in the ROS questionnaire.  It is reviewed with the patient.   Family health history: Diabetes, stroke, Heart disease, cancer, arthritis.  Major events: Gallbladder removed, hysterectomy, tear duct surgery, cataracts extraction.  Ongoing medical problems: Hypertension, asthma, migraine, stroke, depression and anxiety , chronic bronchitis.  Social history: The patient is married. She denies the use of tobacco, alcohol or illegal drugs.   Exam General: Communicates without difficulty, well nourished, no acute distress. Head: Normocephalic, no evidence  injury, no tenderness, facial buttresses intact without stepoff. Eyes: PERRL, EOMI. No scleral icterus, conjunctivae clear. Neuro: CN II exam reveals vision grossly intact.  No nystagmus at any point of gaze. Ears: Auricles well formed without lesions.  Ear canals are intact without mass or lesion.  No erythema or edema is appreciated.  The TMs are intact with bilateral middle ear effusions. Nose: External evaluation reveals normal support and skin without lesions.  Dorsum is intact.  Anterior rhinoscopy reveals congested mucosa over anterior aspect of inferior turbinates and intact septum.  No purulence noted. Oral:  Oral cavity and oropharynx are intact, symmetric, without erythema or edema.  Mucosa is moist without lesions. Neck: Full range of motion without pain.  There is no significant lymphadenopathy.  No masses palpable.  Thyroid bed within normal limits to palpation.  Parotid glands and submandibular glands equal bilaterally without mass.  Trachea is midline. Neuro:  CN 2-12 grossly intact. Gait normal. Vestibular: No nystagmus at any point of gaze. The cerebellar examination is unremarkable.   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 The speech reception threshold is 30dB AD and 35dB AS. The discrimination score is 100% AD and 100% AS. The tympanogram shows negative middle ear pressure bilaterally.   Assessment 1. Bilateral middle ear effusion are noted with associated conductive hearing loss.  2. Eustachian tube dysfunction. 3. 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 The patient refused laryngoscopy evaluation today.   Plan  1. The physical exam and hearing test findings are reviewed with the patient.  2. The patient will continue with her reflux medication regimen and neurontin for  her cough.  3.  The treatment options for the middle ear effusion include continuing conservative observation versus myringotomy and tube placement.  The risks, benefits, and details of the treatment modalities are discussed.  Questions are invited and answered.   4. The patient would like to proceed with myringotomy and tube placement. The procedure will be scheduled at the outpatient surgical center.

## 2018-01-28 NOTE — Anesthesia Postprocedure Evaluation (Signed)
Anesthesia Post Note  Patient: Yvonne [name]  Procedure(s) Performed: BILATERAL MYRINGOTOMY WITH TUBE PLACEMENT (Bilateral )     Patient location during evaluation: PACU Anesthesia Type: General Level of consciousness: awake and alert Pain management: pain level controlled Vital Signs Assessment: post-procedure vital signs reviewed and stable Respiratory status: spontaneous breathing, nonlabored ventilation, respiratory function stable and patient connected to nasal cannula oxygen Cardiovascular status: blood pressure returned to baseline and stable Postop Assessment: no apparent nausea or vomiting Anesthetic complications: no    Last Vitals:  Vitals:   01/28/18 0850 01/28/18 0910  BP:  (!) 154/76  Pulse: 61 (!) 58  Resp: 19 16  Temp:  36.4 C  SpO2: 100% 95%    Last Pain:  Vitals:   01/28/18 0641  TempSrc: Oral                 Effie Berkshire

## 2018-01-28 NOTE — Anesthesia Preprocedure Evaluation (Addendum)
Anesthesia Evaluation  Patient identified by MRN, date of birth, ID band Patient awake    Reviewed: Allergy & Precautions, NPO status , Patient's Chart, lab work & pertinent test results  Airway Mallampati: III  TM Distance: >3 FB Neck ROM: Full    Dental  (+) Edentulous Upper, Edentulous Lower   Pulmonary asthma ,    breath sounds clear to auscultation       Cardiovascular hypertension, Pt. on medications +CHF   Rhythm:Regular Rate:Normal     Neuro/Psych Anxiety  Neuromuscular disease CVA    GI/Hepatic GERD  Medicated,  Endo/Other  diabetes, Type 2, Oral Hypoglycemic Agents  Renal/GU      Musculoskeletal   Abdominal (+) + obese,   Peds  Hematology   Anesthesia Other Findings   Reproductive/Obstetrics                            Lab Results  Component Value Date   WBC 6.2 12/31/2017   HGB 12.6 12/31/2017   HCT 36.8 12/31/2017   MCV 84 12/31/2017   PLT 230 12/31/2017   Lab Results  Component Value Date   CREATININE 0.83 12/31/2017   BUN 13 12/31/2017   NA 142 12/31/2017   K 4.1 12/31/2017   CL 101 12/31/2017   CO2 26 12/31/2017   Lab Results  Component Value Date   INR 0.97 03/21/2017   INR 0.86 09/28/2016   Echo: - Left ventricle: The cavity size was normal. Wall thickness was   increased in a pattern of mild LVH. Systolic function was normal.   The estimated ejection fraction was in the range of 60% to 65%.   Wall motion was normal; there were no regional wall motion   abnormalities. Doppler parameters are consistent with abnormal   left ventricular relaxation (grade 1 diastolic dysfunction).  Anesthesia Physical Anesthesia Plan  ASA: II  Anesthesia Plan: General   Post-op Pain Management:    Induction: Inhalational  PONV Risk Score and Plan: Ondansetron  Airway Management Planned: Mask  Additional Equipment: None  Intra-op Plan:   Post-operative Plan:    Informed Consent: I have reviewed the patients History and Physical, chart, labs and discussed the procedure including the risks, benefits and alternatives for the proposed anesthesia with the patient or authorized representative who has indicated his/her understanding and acceptance.     Plan Discussed with: CRNA  Anesthesia Plan Comments:         Anesthesia Quick Evaluation

## 2018-01-28 NOTE — Op Note (Signed)
DATE OF PROCEDURE:  01/28/2018                              OPERATIVE REPORT  SURGEON:  Leta Baptist, MD  PREOPERATIVE DIAGNOSES: 1. Bilateral eustachian tube dysfunction. 2. Bilateral middle ear effusion  POSTOPERATIVE DIAGNOSES: 1. Bilateral eustachian tube dysfunction. 2. Bilateral middle ear effusion  PROCEDURE PERFORMED: 1) Bilateral myringotomy and tube placement.          ANESTHESIA:  General facemask anesthesia.  COMPLICATIONS:  None.  ESTIMATED BLOOD LOSS:  Minimal.  INDICATION FOR PROCEDURE:   Yvonne [name] is a 67 y.o. 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  Despite multiple courses of antibiotics and steroids, the patient continues to be symptomatic.  On examination, the patient was noted to have middle ear effusion bilaterally.  Based on the above findings, the decision was made for the patient to undergo the myringotomy and tube placement procedure. Likelihood of success in reducing symptoms was also discussed.  The risks, benefits, alternatives, and details of the procedure were discussed with the mother.  Questions were invited and answered.  Informed consent was obtained.  DESCRIPTION:  The patient was taken to the operating room and placed supine on the operating table.  General facemask anesthesia was administered by the anesthesiologist.  Under the operating microscope, the right ear canal was cleaned of all cerumen.  The tympanic membrane was noted to be intact but mildly retracted.  A standard myringotomy incision was made at the anterior-inferior quadrant on the tympanic membrane.  A scant amount of serous fluid was suctioned from behind the tympanic membrane. A T tube was placed, followed by antibiotic eardrops in the ear canal.  The same procedure was repeated on the left side without exception. The care of the patient was turned over to the anesthesiologist.  The patient was awakened from anesthesia without difficulty.  The patient  was transferred to the recovery room in good condition.  OPERATIVE FINDINGS:  A scant amount of serous effusion was noted bilaterally.  SPECIMEN:  None.  FOLLOWUP CARE:  The patient will be placed on Otovel eardrops 1 vial each ear b.i.d..  The patient will follow up in my office in approximately 4 weeks.  [name] 01/28/2018

## 2018-01-29 ENCOUNTER — Encounter (HOSPITAL_BASED_OUTPATIENT_CLINIC_OR_DEPARTMENT_OTHER): Payer: Self-pay | Admitting: Otolaryngology

## 2018-02-05 ENCOUNTER — Telehealth: Payer: Self-pay

## 2018-02-05 NOTE — Telephone Encounter (Signed)
[facility] - left message.  

## 2018-02-07 ENCOUNTER — Ambulatory Visit (INDEPENDENT_AMBULATORY_CARE_PROVIDER_SITE_OTHER): Payer: Medicare HMO | Admitting: Family Medicine

## 2018-02-07 ENCOUNTER — Encounter: Payer: Self-pay | Admitting: Family Medicine

## 2018-02-07 VITALS — BP 141/79 | HR 79 | Temp 97.8°F | Ht 62.0 in | Wt 183.0 lb

## 2018-02-07 DIAGNOSIS — H6523 Chronic serous otitis media, bilateral: Secondary | ICD-10-CM

## 2018-02-07 DIAGNOSIS — I10 Essential (primary) hypertension: Secondary | ICD-10-CM | POA: Diagnosis not present

## 2018-02-07 DIAGNOSIS — I160 Hypertensive urgency: Secondary | ICD-10-CM | POA: Diagnosis not present

## 2018-02-07 DIAGNOSIS — Z79899 Other long term (current) drug therapy: Secondary | ICD-10-CM | POA: Diagnosis not present

## 2018-02-07 DIAGNOSIS — N951 Menopausal and female climacteric states: Secondary | ICD-10-CM

## 2018-02-07 DIAGNOSIS — G43909 Migraine, unspecified, not intractable, without status migrainosus: Secondary | ICD-10-CM | POA: Diagnosis not present

## 2018-02-07 MED ORDER — REMIFEMIN 20 MG PO TABS: 20.0000 mg | ORAL_TABLET | Freq: Every day | ORAL | 0 refills | Status: DC

## 2018-02-07 MED ORDER — CLARITHROMYCIN 500 MG PO TABS: 500.0000 mg | ORAL_TABLET | Freq: Two times a day (BID) | ORAL | 0 refills | Status: AC

## 2018-02-07 MED ORDER — METHYLPREDNISOLONE 8 MG PO TABS
ORAL_TABLET | ORAL | 0 refills | Status: DC
Start: 2018-02-07 — End: 2018-02-19

## 2018-02-07 NOTE — Progress Notes (Signed)
Chief Complaint  Patient presents with  . Ear Pain    HPI  Patient presents today for continued pain in both ears for the last 2 weeks in spite of tubes being placed 2 weeks she is also concerned about hot flashes.  She understands the dangers of estrogens after her stroke one year ago.  PMH: Smoking status noted ROS: Per HPI  Objective: BP (!) 141/79   Pulse 79   Temp 97.8 F (36.6 C) (Oral)   Ht 5\' 2"  (1.575 m)   Wt 183 lb (83 kg)   BMI 33.47 kg/m  Gen: NAD, alert, cooperative with exam HEENT: NCAT, EOMI, PERRL. Fluid noted at each TM posterioinferiorly CV: RRR, good S1/S2, no murmur Resp: CTABL, no wheezes, non-labored Abd: SNTND, BS present, no guarding or organomegaly Ext: No edema, warm Neuro: Alert and oriented, No gross deficits  Assessment and plan:  1. Bilateral chronic serous otitis media   2. Hot flashes due to menopause     Meds ordered this encounter  Medications  . REMIFEMIN 20 MG TABS    Sig: Take 1 tablet (20 mg total) by mouth daily.    Dispense:  60 each    Refill:  0  . methylPREDNISolone (MEDROL) 8 MG tablet    Sig: 4 daily for 2 days, then 3 daily for 2 days, then 2 daily for 2 days, then 1 daily for 2 days    Dispense:  20 tablet    Refill:  0  . clarithromycin (BIAXIN) 500 MG tablet    Sig: Take 1 tablet (500 mg total) by mouth 2 (two) times daily for 10 days.    Dispense:  20 tablet    Refill:  0    No orders of the defined types were placed in this encounter.   Follow up as needed.  [name], MD

## 2018-02-19 ENCOUNTER — Encounter: Payer: Self-pay | Admitting: Family Medicine

## 2018-02-19 ENCOUNTER — Ambulatory Visit (INDEPENDENT_AMBULATORY_CARE_PROVIDER_SITE_OTHER): Payer: Medicare HMO | Admitting: Family Medicine

## 2018-02-19 ENCOUNTER — Telehealth: Payer: Self-pay | Admitting: Family Medicine

## 2018-02-19 VITALS — BP 161/89 | HR 74 | Temp 97.5°F | Ht 62.0 in | Wt 185.1 lb

## 2018-02-19 DIAGNOSIS — I10 Essential (primary) hypertension: Secondary | ICD-10-CM

## 2018-02-19 DIAGNOSIS — H66005 Acute suppurative otitis media without spontaneous rupture of ear drum, recurrent, left ear: Secondary | ICD-10-CM

## 2018-02-19 DIAGNOSIS — G43709 Chronic migraine without aura, not intractable, without status migrainosus: Secondary | ICD-10-CM | POA: Diagnosis not present

## 2018-02-19 HISTORY — DX: Acute suppurative otitis media without spontaneous rupture of ear drum, recurrent, left ear: H66.005

## 2018-02-19 MED ORDER — METOPROLOL SUCCINATE ER 50 MG PO TB24: 50.0000 mg | ORAL_TABLET | Freq: Every day | ORAL | 2 refills | Status: DC

## 2018-02-19 MED ORDER — METHYLPREDNISOLONE 8 MG PO TABS: ORAL_TABLET | ORAL | 0 refills | Status: DC

## 2018-02-19 MED ORDER — MOXIFLOXACIN HCL 400 MG PO TABS: 400.0000 mg | ORAL_TABLET | Freq: Every day | ORAL | 0 refills | Status: DC

## 2018-02-19 MED ORDER — CIPROFLOXACIN-DEXAMETHASONE 0.3-0.1 % OT SUSP: 2.0000 [drp] | Freq: Two times a day (BID) | OTIC | 0 refills | Status: DC

## 2018-02-19 NOTE — Progress Notes (Signed)
Subjective:  Patient ID: Yvonne [name], female    DOB: February 24, 1951  Age: 67 y.o. MRN: [id number]  CC: Ear Pain   HPI Yvonne [name] presents for recurrent left ear pain.  She has tubes in her ears but 3-week days ago she noticed that the pain started again.  She has had some mild upper respiratory congestion but no significant symptoms other than the earache.  She is due to see her ENT again next week.  She has had a headache for 2 days.  A dull ache over the forehead.  No cough no fever chills or sweats.  Last time she was treated for an ear infection she had a Medrol Dosepak and ended up having to go to the emergency room for migraine.  She started having migraines 2-3 times a month.  The steroid seems to exacerbate.  She was told by the emergency room doctor that she may need to go on a migraine prevention medication or something as an abortifacient.  Depression screen [facility] 2/9 02/07/2018 01/22/2018 12/31/2017  Decreased Interest '2 3 2  '$ Down, Depressed, Hopeless '3 3 2  '$ PHQ - 2 Score '5 6 4  '$ Altered sleeping '3 1 3  '$ Tired, decreased energy '3 2 3  '$ Change in appetite '3 1 3  '$ Feeling bad or failure about yourself  '3 3 2  '$ Trouble concentrating '2 2 2  '$ Moving slowly or fidgety/restless 0 1 0  Suicidal thoughts 0 0 0  PHQ-9 Score '19 16 17  '$ Difficult doing work/chores - - -  Some recent data might be hidden    History Yvonne [name] has a past medical history of Anxiety, Arthritis, Asthma, Chronic cough, Chronic otitis media (12/2017), Full dentures, GERD (gastroesophageal reflux disease), History of stroke (09/2017), Hyperlipidemia, Hypertension, Non-insulin dependent type 2 diabetes mellitus ([place]), and Overactive bladder.   She has a past surgical history that includes Cholecystectomy; Colonoscopy (N/A, 08/17/2015); Abdominal hysterectomy; Lacrimal duct exploration (Bilateral); Cataract extraction w/ intraocular lens implant (Right); Vitrectomy (Left, 09/14/2015); Gas insertion (Right); and Myringotomy with  tube placement (Bilateral, 01/28/2018).   Her family history includes Arthritis in her brother and mother; Asthma in her father; CVA in her mother; Cancer in her brother; Diabetes in her father and mother; Heart disease in her father; Hepatitis C in her sister; Stroke in her mother.She reports that she has never smoked. She has never used smokeless tobacco. She reports that she does not drink alcohol or use drugs.    ROS Review of Systems  Constitutional: Positive for fatigue. Negative for activity change, appetite change and fever.  HENT: Positive for congestion and ear pain. Negative for rhinorrhea and sinus pressure.   Respiratory: Negative for cough.   Neurological: Positive for headaches.    Objective:  BP (!) 161/89   Pulse 74   Temp (!) 97.5 F (36.4 C) (Oral)   Ht '5\' 2"'$  (1.575 m)   Wt 185 lb 2 oz (84 kg)   BMI 33.86 kg/m   BP Readings from Last 3 Encounters:  02/19/18 (!) 161/89  02/07/18 (!) 141/79  01/28/18 (!) 154/76    Wt Readings from Last 3 Encounters:  02/19/18 185 lb 2 oz (84 kg)  02/07/18 183 lb (83 kg)  01/28/18 181 lb 3.2 oz (82.2 kg)     Physical Exam  Constitutional: She is oriented to person, place, and time. She appears well-developed and well-nourished.  HENT:  Head: Normocephalic.  Right Ear: Tympanic membrane and external ear normal.  Left Ear:  External ear normal. Tympanic membrane is injected. A middle ear effusion is present.  Bilateral P.E. Tubes in place   Eyes: Pupils are equal, round, and reactive to light. Conjunctivae and EOM are normal.  Neck: Normal range of motion.  Cardiovascular: Normal rate, regular rhythm and normal heart sounds.  No murmur heard. Pulmonary/Chest: Effort normal and breath sounds normal. She has no wheezes. She has no rales.  Musculoskeletal: Normal range of motion.  Lymphadenopathy:    She has no cervical adenopathy.  Neurological: She is alert and oriented to person, place, and time.  Skin: Skin is warm  and dry.  Psychiatric: She has a normal mood and affect.      Assessment & Plan:   Yvonne [name] was seen today for ear pain.  Diagnoses and all orders for this visit:  Chronic migraine without aura without status migrainosus, not intractable  Recurrent acute suppurative otitis media without spontaneous rupture of left tympanic membrane  Essential hypertension  Other orders -     moxifloxacin (AVELOX) 400 MG tablet; Take 1 tablet (400 mg total) by mouth daily. -     ciprofloxacin-dexamethasone (CIPRODEX) OTIC suspension; Place 2 drops into both ears 2 (two) times daily. -     methylPREDNISolone (MEDROL) 8 MG tablet; 4 daily for 2 days, then 3 daily for 2 days, then 2 daily for 2 days, then 1 daily for 2 days -     metoprolol succinate (TOPROL-XL) 50 MG 24 hr tablet; Take 1 tablet (50 mg total) by mouth daily. Take with or immediately following a meal.    Patient's blood pressures been running high and the Toprol-XL should help with that as well as assist with migraine prophylaxis.  Will work on fine tuning her migraine prophylaxis as well as recheck her diabetes at her next checkup scheduled for May 6.  Meanwhile hopefully her ENT will have some insight into how to resolve these frequent ear infections when she follows up with him next week.   I am having Yvonne [name] start on moxifloxacin, ciprofloxacin-dexamethasone, and metoprolol succinate. I am also having her maintain her HYDROcodone-acetaminophen, aspirin, ezetimibe, fexofenadine, ALPRAZolam, furosemide, albuterol, fluticasone furoate-vilanterol, metFORMIN, amLODipine, gabapentin, pantoprazole, solifenacin, LANCETS ULTRA THIN, glucose blood, blood glucose meter kit and supplies, atorvastatin, REMIFEMIN, and methylPREDNISolone.  Allergies as of 02/19/2018      Reactions   Penicillins Hives      Lisinopril Cough   Soap Rash   DIAL       Medication List        Accurate as of 02/19/18  4:18 PM. Always use your most recent med  list.          albuterol 108 (90 Base) MCG/ACT inhaler Commonly known as:  VENTOLIN HFA Inhale 2 puffs into the lungs every 6 (six) hours as needed for wheezing or shortness of breath.   ALPRAZolam 1 MG tablet Commonly known as:  XANAX Take 1 tablet (1 mg total) 3 (three) times daily by mouth.   amLODipine 5 MG tablet Commonly known as:  NORVASC TAKE 1 TABLET (5 MG TOTAL) BY MOUTH DAILY.   aspirin 325 MG tablet Take 1 tablet (325 mg total) by mouth daily.   atorvastatin 40 MG tablet Commonly known as:  LIPITOR TAKE 1 TABLET EVERY DAY AT 6PM   blood glucose meter kit and supplies Use as directed   ciprofloxacin-dexamethasone OTIC suspension Commonly known as:  CIPRODEX Place 2 drops into both ears 2 (two) times daily.   ezetimibe 10  MG tablet Commonly known as:  ZETIA Take 1 tablet (10 mg total) by mouth daily. For cholesterol   fexofenadine 180 MG tablet Commonly known as:  ALLEGRA ALLERGY Take 1 tablet (180 mg total) by mouth daily.   fluticasone furoate-vilanterol 100-25 MCG/INH Aepb Commonly known as:  BREO ELLIPTA Inhale 1 puff into the lungs daily.   furosemide 20 MG tablet Commonly known as:  LASIX Take 1 tablet (20 mg total) by mouth daily.   gabapentin 300 MG capsule Commonly known as:  NEURONTIN Take 2 capsules (600 mg total) by mouth 4 (four) times daily.   glucose blood test strip Test once daily.   HYDROcodone-acetaminophen 10-325 MG tablet Commonly known as:  NORCO Take 1 tablet by mouth every 6 (six) hours as needed for moderate pain. Reported on 02/07/2016   LANCETS ULTRA THIN Misc Test once daily.   metFORMIN 750 MG 24 hr tablet Commonly known as:  GLUCOPHAGE-XR Take 1 tablet (750 mg total) by mouth daily with breakfast.   methylPREDNISolone 8 MG tablet Commonly known as:  MEDROL 4 daily for 2 days, then 3 daily for 2 days, then 2 daily for 2 days, then 1 daily for 2 days   metoprolol succinate 50 MG 24 hr tablet Commonly known as:   TOPROL-XL Take 1 tablet (50 mg total) by mouth daily. Take with or immediately following a meal.   moxifloxacin 400 MG tablet Commonly known as:  AVELOX Take 1 tablet (400 mg total) by mouth daily.   pantoprazole 40 MG tablet Commonly known as:  PROTONIX Take 1 tablet (40 mg total) by mouth 2 (two) times daily. For stomach   REMIFEMIN 20 MG Tabs Generic drug:  [place] Take 1 tablet (20 mg total) by mouth daily.   solifenacin 10 MG tablet Commonly known as:  VESICARE Take 1 tablet (10 mg total) by mouth daily.        Follow-up: No follow-ups on file.  [name], M.D.

## 2018-02-26 ENCOUNTER — Ambulatory Visit (INDEPENDENT_AMBULATORY_CARE_PROVIDER_SITE_OTHER): Payer: Medicare HMO | Admitting: *Deleted

## 2018-02-26 DIAGNOSIS — Z23 Encounter for immunization: Secondary | ICD-10-CM

## 2018-02-26 NOTE — Progress Notes (Signed)
Pt given Shingrix vaccine Tolerated well 

## 2018-02-27 ENCOUNTER — Ambulatory Visit (INDEPENDENT_AMBULATORY_CARE_PROVIDER_SITE_OTHER): Payer: Medicare HMO | Admitting: Otolaryngology

## 2018-02-27 DIAGNOSIS — H6983 Other specified disorders of Eustachian tube, bilateral: Secondary | ICD-10-CM

## 2018-02-27 DIAGNOSIS — H7203 Central perforation of tympanic membrane, bilateral: Secondary | ICD-10-CM

## 2018-02-27 DIAGNOSIS — D225 Melanocytic nevi of trunk: Secondary | ICD-10-CM | POA: Diagnosis not present

## 2018-02-27 DIAGNOSIS — L570 Actinic keratosis: Secondary | ICD-10-CM | POA: Diagnosis not present

## 2018-02-27 DIAGNOSIS — X32XXXA Exposure to sunlight, initial encounter: Secondary | ICD-10-CM | POA: Diagnosis not present

## 2018-02-27 DIAGNOSIS — Z1283 Encounter for screening for malignant neoplasm of skin: Secondary | ICD-10-CM | POA: Diagnosis not present

## 2018-03-06 ENCOUNTER — Telehealth: Payer: Self-pay

## 2018-03-06 NOTE — Telephone Encounter (Signed)
[facility] - left message.  

## 2018-03-07 ENCOUNTER — Other Ambulatory Visit: Payer: Self-pay | Admitting: Family Medicine

## 2018-03-07 DIAGNOSIS — Z780 Asymptomatic menopausal state: Secondary | ICD-10-CM

## 2018-03-07 NOTE — Telephone Encounter (Signed)
Letter sent with appointment date/time 

## 2018-03-22 ENCOUNTER — Ambulatory Visit (INDEPENDENT_AMBULATORY_CARE_PROVIDER_SITE_OTHER): Payer: Medicare HMO | Admitting: Family Medicine

## 2018-03-22 ENCOUNTER — Encounter: Payer: Self-pay | Admitting: Family Medicine

## 2018-03-22 VITALS — BP 150/80 | HR 86 | Temp 98.2°F | Ht 62.0 in | Wt 185.6 lb

## 2018-03-22 DIAGNOSIS — J4541 Moderate persistent asthma with (acute) exacerbation: Secondary | ICD-10-CM | POA: Diagnosis not present

## 2018-03-22 DIAGNOSIS — R059 Cough, unspecified: Secondary | ICD-10-CM

## 2018-03-22 DIAGNOSIS — R05 Cough: Secondary | ICD-10-CM | POA: Diagnosis not present

## 2018-03-22 MED ORDER — DOXYCYCLINE HYCLATE 100 MG PO TABS: 100.0000 mg | ORAL_TABLET | Freq: Two times a day (BID) | ORAL | 0 refills | Status: AC

## 2018-03-22 MED ORDER — IPRATROPIUM-ALBUTEROL 0.5-2.5 (3) MG/3ML IN SOLN
3.0000 mL | Freq: Once | RESPIRATORY_TRACT | Status: AC
  Administered 2018-03-22: 3 mL via RESPIRATORY_TRACT

## 2018-03-22 MED ORDER — METHYLPREDNISOLONE ACETATE 80 MG/ML IJ SUSP
80.0000 mg | Freq: Once | INTRAMUSCULAR | Status: AC
  Administered 2018-03-22: 80 mg via INTRAMUSCULAR

## 2018-03-22 MED ORDER — PREDNISONE 20 MG PO TABS: 40.0000 mg | ORAL_TABLET | Freq: Every day | ORAL | 0 refills | Status: AC

## 2018-03-22 NOTE — Patient Instructions (Signed)
You are given a DuoNeb treatment here in office.  I also gave you a dose of steroid through shot.  You have been prescribed 2 medications.  Prednisone to take every morning for the next 5 days.  Start this tomorrow since you got the shot.  I have also prescribed an oral antibiotic called doxycycline.  Take this twice a day with food.  Follow-up if symptoms are not improving.  Seek immediate medical attention in the emergency department if symptoms worsen.   Asthma, Acute Bronchospasm Acute bronchospasm caused by asthma is also referred to as an asthma attack. Bronchospasm means your air passages become narrowed. The narrowing is caused by inflammation and tightening of the muscles in the air tubes (bronchi) in your lungs. This can make it hard to breathe or cause you to wheeze and cough. What are the causes? Possible triggers are:  Animal dander from the skin, hair, or feathers of animals.  Dust mites contained in house dust.  Cockroaches.  Pollen from trees or grass.  Mold.  Cigarette or tobacco smoke.  Air pollutants such as dust, household cleaners, hair sprays, aerosol sprays, paint fumes, strong chemicals, or strong odors.  Cold air or weather changes. Cold air may trigger inflammation. Winds increase molds and pollens in the air.  Strong emotions such as crying or laughing hard.  Stress.  Certain medicines such as aspirin or beta-blockers.  Sulfites in foods and drinks, such as dried fruits and wine.  Infections or inflammatory conditions, such as a flu, cold, or inflammation of the nasal membranes (rhinitis).  Gastroesophageal reflux disease (GERD). GERD is a condition where stomach acid backs up into your esophagus.  Exercise or strenuous activity.  What are the signs or symptoms?  Wheezing.  Excessive coughing, particularly at night.  Chest tightness.  Shortness of breath. How is this diagnosed? Your health care provider will ask you about your medical history  and perform a physical exam. A chest X-ray or blood testing may be performed to look for other causes of your symptoms or other conditions that may have triggered your asthma attack. How is this treated? Treatment is aimed at reducing inflammation and opening up the airways in your lungs. Most asthma attacks are treated with inhaled medicines. These include quick relief or rescue medicines (such as bronchodilators) and controller medicines (such as inhaled corticosteroids). These medicines are sometimes given through an inhaler or a nebulizer. Systemic steroid medicine taken by mouth or given through an IV tube also can be used to reduce the inflammation when an attack is moderate or severe. Antibiotic medicines are only used if a bacterial infection is present. Follow these instructions at home:  Rest.  Drink plenty of liquids. This helps the mucus to remain thin and be easily coughed up. Only use caffeine in moderation and do not use alcohol until you have recovered from your illness.  Do not smoke. Avoid being exposed to secondhand smoke.  You play a critical role in keeping yourself in good health. Avoid exposure to things that cause you to wheeze or to have breathing problems.  Keep your medicines up-to-date and available. Carefully follow your health care provider's treatment plan.  Take your medicine exactly as prescribed.  When pollen or pollution is bad, keep windows closed and use an air conditioner or go to places with air conditioning.  Asthma requires careful medical care. See your health care provider for a follow-up as advised. If you are more than [redacted] weeks pregnant and you were  prescribed any new medicines, let your obstetrician know about the visit and how you are doing. Follow up with your health care provider as directed.  After you have recovered from your asthma attack, make an appointment with your outpatient doctor to talk about ways to reduce the likelihood of future  attacks. If you do not have a doctor who manages your asthma, make an appointment with a primary care doctor to discuss your asthma. Get help right away if:  You are getting worse.  You have trouble breathing. If severe, call your local emergency services (911 in the [place]  You develop chest pain or discomfort.  You are vomiting.  You are not able to keep fluids down.  You are coughing up yellow, green, brown, or bloody sputum.  You have a fever and your symptoms suddenly get worse.  You have trouble swallowing. This information is not intended to replace advice given to you by your health care provider. Make sure you discuss any questions you have with your health care provider. Document Released: 02/27/2007 Document Revised: 04/25/2016 Document Reviewed: 05/20/2013 Elsevier Interactive Patient Education  2017 [employer].

## 2018-03-22 NOTE — Progress Notes (Signed)
Subjective: CC: Cough, chest congestion, ear pain PCP: [name], MD ASN:[id number] Yvonne [name] is a 67 y.o. female presenting to clinic today for:  1.  Cough Patient reports that she has had issues with chronic bronchitis in the past.  She notes that she has been having a productive cough that is harsh in nature.  She notes fevers to 102 F last evening.  Denies any nausea, vomiting, diarrhea.  She notes some associated ear pain.  She has been drinking plenty of liquids and trying to rest in efforts to improve her symptoms.  She is also been using her albuterol and Breo which helps some but are not providing significant improvement.  ROS: Per HPI  Allergies  Allergen Reactions  . Penicillins Hives       . Lisinopril Cough  . Soap Rash    DIAL    Past Medical History:  Diagnosis Date  . Anxiety   . Arthritis    left hand  . Asthma    daily and prn inhalers  . Chronic cough   . Chronic otitis media 12/2017  . Full dentures   . GERD (gastroesophageal reflux disease)   . History of stroke 09/2017   weakness right hand, numbness right side face  . Hyperlipidemia   . Hypertension    states under control with meds., has been on med. x a long time, per pt.  . Non-insulin dependent type 2 diabetes mellitus ([place])   . Overactive bladder     Current Outpatient Medications:  .  albuterol (VENTOLIN HFA) 108 (90 Base) MCG/ACT inhaler, Inhale 2 puffs into the lungs every 6 (six) hours as needed for wheezing or shortness of breath., Disp: 18 Inhaler, Rfl: 5 .  ALPRAZolam (XANAX) 1 MG tablet, Take 1 tablet (1 mg total) 3 (three) times daily by mouth., Disp: 90 tablet, Rfl: 5 .  amLODipine (NORVASC) 5 MG tablet, TAKE 1 TABLET (5 MG TOTAL) BY MOUTH DAILY., Disp: 90 tablet, Rfl: 1 .  aspirin 325 MG tablet, Take 1 tablet (325 mg total) by mouth daily., Disp: 30 tablet, Rfl: 0 .  atorvastatin (LIPITOR) 40 MG tablet, TAKE 1 TABLET EVERY DAY AT 6PM, Disp: 90 tablet, Rfl: 1 .  blood glucose meter  kit and supplies, Use as directed, Disp: 1 each, Rfl: 0 .  ezetimibe (ZETIA) 10 MG tablet, Take 1 tablet (10 mg total) by mouth daily. For cholesterol, Disp: 90 tablet, Rfl: 3 .  fexofenadine (ALLEGRA ALLERGY) 180 MG tablet, Take 1 tablet (180 mg total) by mouth daily., Disp: 30 tablet, Rfl: 5 .  fluticasone furoate-vilanterol (BREO ELLIPTA) 100-25 MCG/INH AEPB, Inhale 1 puff into the lungs daily., Disp: 180 each, Rfl: 1 .  furosemide (LASIX) 20 MG tablet, Take 1 tablet (20 mg total) by mouth daily., Disp: 90 tablet, Rfl: 1 .  gabapentin (NEURONTIN) 300 MG capsule, Take 2 capsules (600 mg total) by mouth 4 (four) times daily. (Patient taking differently: Take 600 mg by mouth 3 (three) times daily. ), Disp: 480 capsule, Rfl: 5 .  glucose blood test strip, Test once daily., Disp: 100 each, Rfl: 12 .  HYDROcodone-acetaminophen (NORCO) 10-325 MG tablet, Take 1 tablet by mouth every 6 (six) hours as needed for moderate pain. Reported on 02/07/2016 (Patient taking differently: Take 1 tablet by mouth every 8 (eight) hours as needed for moderate pain. Reported on 02/07/2016), Disp: 60 tablet, Rfl: 0 .  LANCETS ULTRA THIN MISC, Test once daily., Disp: 100 each, Rfl: prn .  metFORMIN (GLUCOPHAGE-XR) 750 MG 24 hr tablet, Take 1 tablet (750 mg total) by mouth daily with breakfast., Disp: 30 tablet, Rfl: 2 .  metoprolol succinate (TOPROL-XL) 50 MG 24 hr tablet, Take 1 tablet (50 mg total) by mouth daily. Take with or immediately following a meal., Disp: 30 tablet, Rfl: 2 .  moxifloxacin (AVELOX) 400 MG tablet, Take 1 tablet (400 mg total) by mouth daily., Disp: 10 tablet, Rfl: 0 .  pantoprazole (PROTONIX) 40 MG tablet, Take 1 tablet (40 mg total) by mouth 2 (two) times daily. For stomach, Disp: 60 tablet, Rfl: 5 .  REMIFEMIN 20 MG TABS, Take 1 tablet (20 mg total) by mouth daily., Disp: 60 each, Rfl: 0 .  solifenacin (VESICARE) 10 MG tablet, Take 1 tablet (10 mg total) by mouth daily., Disp: 90 tablet, Rfl: 0 Social  History   Socioeconomic History  . Marital status: Married    Spouse name: Not on file  . Number of children: 3  . Years of education: Not on file  . Highest education level: Not on file  Occupational History  . Occupation: unemployed  Social Needs  . Financial resource strain: Not on file  . Food insecurity:    Worry: Not on file    Inability: Not on file  . Transportation needs:    Medical: Not on file    Non-medical: Not on file  Tobacco Use  . Smoking status: Never Smoker  . Smokeless tobacco: Never Used  Substance and Sexual Activity  . Alcohol use: No  . Drug use: No  . Sexual activity: Yes  Lifestyle  . Physical activity:    Days per week: Not on file    Minutes per session: Not on file  . Stress: Not on file  Relationships  . Social connections:    Talks on phone: Not on file    Gets together: Not on file    Attends religious service: Not on file    Active member of club or organization: Not on file    Attends meetings of clubs or organizations: Not on file    Relationship status: Not on file  . Intimate partner violence:    Fear of current or ex partner: Not on file    Emotionally abused: Not on file    Physically abused: Not on file    Forced sexual activity: Not on file  Other Topics Concern  . Not on file  Social History Narrative   Lives at home home with husband with son, daughter in law and 3 children.    Disabled.   Family History  Problem Relation Age of Onset  . Arthritis Mother   . Diabetes Mother   . CVA Mother   . Stroke Mother   . Diabetes Father   . Heart disease Father        CABG.  Does not know age of onset  . Asthma Father   . Hepatitis C Sister   . Arthritis Brother   . Cancer Brother        metastic cancer    Objective: Office vital signs reviewed. BP (!) 150/80   Pulse 86   Temp 98.2 F (36.8 C) (Oral)   Ht '5\' 2"'$  (1.575 m)   Wt 185 lb 9.6 oz (84.2 kg)   SpO2 99%   BMI 33.95 kg/m   Physical Examination:    General: Awake, alert, well nourished, nontoxic, No acute distress HEENT: sclera white, MMM Cardio: regular rate and rhythm, S1S2  heard, no murmurs appreciated Pulm: Harsh cough noted during exam.  Air movement fair.  She has global expiratory wheezes on exam.  No rhonchi or rales; normal work of breathing on room air  Assessment/ Plan: 67 y.o. female   1. Moderate persistent asthma with exacerbation Harsh cough noted during today's exam.  Given history of asthma, will treat with course of oral antibiotics, oral steroids and scheduled albuterol for the next couple of days.  Lung exam did improve some after administration of DuoNeb.  Though she still had some expiratory wheezes noted on exam.  I recommended that she continue use of albuterol 2 puffs every 6 hours for the next 2 days.  She may start prednisone tomorrow morning.  Start doxycycline today.  Take with food.  Home care instructions were reviewed.  Reasons for emergent evaluation in the emergency department discussed.  She voiced good understanding will follow-up as needed. - methylPREDNISolone acetate (DEPO-MEDROL) injection 80 mg  2. Cough - ipratropium-albuterol (DUONEB) 0.5-2.5 (3) MG/3ML nebulizer solution 3 mL - methylPREDNISolone acetate (DEPO-MEDROL) injection 80 mg  Meds ordered this encounter  Medications  . ipratropium-albuterol (DUONEB) 0.5-2.5 (3) MG/3ML nebulizer solution 3 mL  . methylPREDNISolone acetate (DEPO-MEDROL) injection 80 mg  . predniSONE (DELTASONE) 20 MG tablet    Sig: Take 2 tablets (40 mg total) by mouth daily with breakfast for 5 days.    Dispense:  10 tablet    Refill:  0  . doxycycline (VIBRA-TABS) 100 MG tablet    Sig: Take 1 tablet (100 mg total) by mouth 2 (two) times daily for 7 days.    Dispense:  14 tablet    Refill:  [place], DO [place] [phone]

## 2018-03-24 ENCOUNTER — Ambulatory Visit (INDEPENDENT_AMBULATORY_CARE_PROVIDER_SITE_OTHER): Payer: Medicare HMO | Admitting: Family Medicine

## 2018-03-24 ENCOUNTER — Encounter: Payer: Self-pay | Admitting: Family Medicine

## 2018-03-24 VITALS — BP 173/89 | HR 82 | Temp 97.6°F | Ht 62.0 in | Wt 189.5 lb

## 2018-03-24 DIAGNOSIS — J4541 Moderate persistent asthma with (acute) exacerbation: Secondary | ICD-10-CM

## 2018-03-24 MED ORDER — HYDROCODONE-HOMATROPINE 5-1.5 MG/5ML PO SYRP: 5.0000 mL | ORAL_SOLUTION | Freq: Four times a day (QID) | ORAL | 0 refills | Status: AC | PRN

## 2018-03-24 MED ORDER — MOXIFLOXACIN HCL 400 MG PO TABS: 400.0000 mg | ORAL_TABLET | Freq: Every day | ORAL | 0 refills | Status: DC

## 2018-03-24 NOTE — Progress Notes (Signed)
Chief Complaint  Patient presents with  . Cough    pt here today for cough and was seen Saturday with Dr Lajuana Ripple who gave her antibiotics, steroid shot and oral prednisone but nothing for the cough.    HPI  Patient presents today for increasing cough in spite of the treatment.  She has been on steroids and had the injection is taking the doxycycline prescribed and is remaining short of breath having near constant paroxysms of cough that have caused her to be exhausted she is not sleeping at all because she cannot lay down without developing intractable cough.  She is producing moderate amounts of purulent sputum.  PMH: Smoking status noted ROS: Per HPI  Objective: BP (!) 173/89   Pulse 82   Temp 97.6 F (36.4 C) (Oral)   Ht 5\' 2"  (1.575 m)   Wt 189 lb 8 oz (86 kg)   BMI 34.66 kg/m  Gen: Moderate distress, alert, cooperative with exam HEENT: NCAT, EOMI, PERRL CV: RRR, good S1/S2, no murmur Resp: Scattered wheezing without rales Ext: No edema, warm Neuro: Alert and oriented, No gross deficits  Assessment and plan:  1. Moderate persistent asthma with exacerbation     Meds ordered this encounter  Medications  . moxifloxacin (AVELOX) 400 MG tablet    Sig: Take 1 tablet (400 mg total) by mouth daily. Take all of these, for infection    Dispense:  10 tablet    Refill:  0  . HYDROcodone-homatropine (HYCODAN) 5-1.5 MG/5ML syrup    Sig: Take 5 mLs by mouth every 6 (six) hours as needed for up to 5 days for cough.    Dispense:  100 mL    Refill:  0    No orders of the defined types were placed in this encounter.   Follow up as needed.  [name], MD

## 2018-03-25 ENCOUNTER — Other Ambulatory Visit: Payer: Self-pay | Admitting: Family Medicine

## 2018-03-25 ENCOUNTER — Other Ambulatory Visit: Payer: Medicare HMO

## 2018-03-25 DIAGNOSIS — M533 Sacrococcygeal disorders, not elsewhere classified: Secondary | ICD-10-CM | POA: Diagnosis not present

## 2018-03-25 DIAGNOSIS — G894 Chronic pain syndrome: Secondary | ICD-10-CM | POA: Diagnosis not present

## 2018-03-25 DIAGNOSIS — Z1231 Encounter for screening mammogram for malignant neoplasm of breast: Secondary | ICD-10-CM | POA: Diagnosis not present

## 2018-03-25 DIAGNOSIS — F1190 Opioid use, unspecified, uncomplicated: Secondary | ICD-10-CM | POA: Diagnosis not present

## 2018-03-25 DIAGNOSIS — M5442 Lumbago with sciatica, left side: Secondary | ICD-10-CM | POA: Diagnosis not present

## 2018-03-25 DIAGNOSIS — E119 Type 2 diabetes mellitus without complications: Secondary | ICD-10-CM

## 2018-03-27 ENCOUNTER — Other Ambulatory Visit: Payer: Self-pay | Admitting: Family Medicine

## 2018-03-31 ENCOUNTER — Ambulatory Visit: Payer: Medicare HMO | Admitting: Family Medicine

## 2018-04-02 ENCOUNTER — Ambulatory Visit: Payer: Medicare HMO | Admitting: Family Medicine

## 2018-04-04 ENCOUNTER — Encounter: Payer: Self-pay | Admitting: Family Medicine

## 2018-04-10 ENCOUNTER — Encounter (HOSPITAL_COMMUNITY): Payer: Self-pay

## 2018-04-10 ENCOUNTER — Ambulatory Visit (HOSPITAL_COMMUNITY)
Admission: RE | Admit: 2018-04-10 | Discharge: 2018-04-10 | Disposition: A | Discharge status: Home / Self Care | Payer: Medicare HMO | Source: Ambulatory Visit | Attending: Family Medicine | Admitting: Family Medicine

## 2018-04-10 DIAGNOSIS — Z1231 Encounter for screening mammogram for malignant neoplasm of breast: Secondary | ICD-10-CM

## 2018-04-11 ENCOUNTER — Telehealth: Payer: Self-pay | Admitting: Licensed Clinical Social Worker

## 2018-04-11 NOTE — Telephone Encounter (Signed)
This [facility] specialist left message to call back with name and contact information. 

## 2018-04-24 ENCOUNTER — Ambulatory Visit (INDEPENDENT_AMBULATORY_CARE_PROVIDER_SITE_OTHER): Payer: Medicare HMO | Admitting: Family

## 2018-04-24 ENCOUNTER — Ambulatory Visit: Payer: Medicare HMO | Admitting: Family Medicine

## 2018-04-24 ENCOUNTER — Encounter: Payer: Self-pay | Admitting: Family

## 2018-04-24 VITALS — BP 137/77 | HR 75 | Temp 98.0°F | Ht 62.0 in | Wt 183.0 lb

## 2018-04-24 DIAGNOSIS — R1031 Right lower quadrant pain: Secondary | ICD-10-CM | POA: Diagnosis not present

## 2018-04-24 DIAGNOSIS — K5792 Diverticulitis of intestine, part unspecified, without perforation or abscess without bleeding: Secondary | ICD-10-CM | POA: Diagnosis not present

## 2018-04-24 DIAGNOSIS — N3001 Acute cystitis with hematuria: Secondary | ICD-10-CM

## 2018-04-24 LAB — URINALYSIS, COMPLETE
BILIRUBIN UA: NEGATIVE
GLUCOSE, UA: NEGATIVE
NITRITE UA: NEGATIVE
SPEC GRAV UA: 1.025 (ref 1.005–1.030)
UUROB: 0.2 mg/dL (ref 0.2–1.0)
pH, UA: 6.5 (ref 5.0–7.5)

## 2018-04-24 LAB — MICROSCOPIC EXAMINATION: Renal Epithel, UA: NONE SEEN /hpf

## 2018-04-24 MED ORDER — CIPROFLOXACIN HCL 500 MG PO TABS: 500.0000 mg | ORAL_TABLET | Freq: Two times a day (BID) | ORAL | 0 refills | Status: DC

## 2018-04-24 MED ORDER — METRONIDAZOLE 500 MG PO TABS: 500.0000 mg | ORAL_TABLET | Freq: Three times a day (TID) | ORAL | 0 refills | Status: DC

## 2018-04-24 NOTE — Progress Notes (Deleted)
Subjective: CC: stomach pain PCP: [name], MD [id number] V Trnka is a 67 y.o. female presenting to clinic today for:  1. Stomach pain ***   ROS: Per HPI  Allergies  Allergen Reactions  . Penicillins Hives       . Lisinopril Cough  . Soap Rash    DIAL    Past Medical History:  Diagnosis Date  . Anxiety   . Arthritis    left hand  . Asthma    daily and prn inhalers  . Chronic cough   . Chronic otitis media 12/2017  . Full dentures   . GERD (gastroesophageal reflux disease)   . History of stroke 09/2017   weakness right hand, numbness right side face  . Hyperlipidemia   . Hypertension    states under control with meds., has been on med. x a long time, per pt.  . Non-insulin dependent type 2 diabetes mellitus ([place])   . Overactive bladder     Current Outpatient Medications:  .  albuterol (VENTOLIN HFA) 108 (90 Base) MCG/ACT inhaler, Inhale 2 puffs into the lungs every 6 (six) hours as needed for wheezing or shortness of breath., Disp: 18 Inhaler, Rfl: 5 .  ALPRAZolam (XANAX) 1 MG tablet, TAKE 1 TABLET ('1MG'$  TOTAL) 3 (THREE)TIMES DAILY BY MOUTH, Disp: 90 tablet, Rfl: 2 .  amLODipine (NORVASC) 5 MG tablet, TAKE 1 TABLET (5 MG TOTAL) BY MOUTH DAILY., Disp: 90 tablet, Rfl: 1 .  aspirin 325 MG tablet, Take 1 tablet (325 mg total) by mouth daily., Disp: 30 tablet, Rfl: 0 .  atorvastatin (LIPITOR) 40 MG tablet, TAKE 1 TABLET EVERY DAY AT 6PM, Disp: 90 tablet, Rfl: 1 .  blood glucose meter kit and supplies, Use as directed, Disp: 1 each, Rfl: 0 .  ezetimibe (ZETIA) 10 MG tablet, Take 1 tablet (10 mg total) by mouth daily. For cholesterol, Disp: 90 tablet, Rfl: 3 .  fexofenadine (ALLEGRA ALLERGY) 180 MG tablet, Take 1 tablet (180 mg total) by mouth daily., Disp: 30 tablet, Rfl: 5 .  fluticasone furoate-vilanterol (BREO ELLIPTA) 100-25 MCG/INH AEPB, Inhale 1 puff into the lungs daily., Disp: 180 each, Rfl: 1 .  furosemide (LASIX) 20 MG tablet, Take 1 tablet (20 mg total)  by mouth daily., Disp: 90 tablet, Rfl: 1 .  gabapentin (NEURONTIN) 300 MG capsule, Take 2 capsules (600 mg total) by mouth 4 (four) times daily. (Patient taking differently: Take 600 mg by mouth 3 (three) times daily. ), Disp: 480 capsule, Rfl: 5 .  glucose blood test strip, Test once daily., Disp: 100 each, Rfl: 12 .  HYDROcodone-acetaminophen (NORCO) 10-325 MG tablet, Take 1 tablet by mouth every 6 (six) hours as needed for moderate pain. Reported on 02/07/2016 (Patient taking differently: Take 1 tablet by mouth every 8 (eight) hours as needed for moderate pain. Reported on 02/07/2016), Disp: 60 tablet, Rfl: 0 .  LANCETS ULTRA THIN MISC, Test once daily., Disp: 100 each, Rfl: prn .  metFORMIN (GLUCOPHAGE-XR) 750 MG 24 hr tablet, TAKE 1 TABLET (750 MG TOTAL) BY MOUTH DAILY WITH BREAKFAST., Disp: 30 tablet, Rfl: 2 .  metoprolol succinate (TOPROL-XL) 50 MG 24 hr tablet, Take 1 tablet (50 mg total) by mouth daily. Take with or immediately following a meal., Disp: 30 tablet, Rfl: 2 .  moxifloxacin (AVELOX) 400 MG tablet, Take 1 tablet (400 mg total) by mouth daily. Take all of these, for infection, Disp: 10 tablet, Rfl: 0 .  pantoprazole (PROTONIX) 40 MG tablet, Take 1 tablet (  40 mg total) by mouth 2 (two) times daily. For stomach, Disp: 60 tablet, Rfl: 5 .  REMIFEMIN 20 MG TABS, Take 1 tablet (20 mg total) by mouth daily., Disp: 60 each, Rfl: 0 .  solifenacin (VESICARE) 10 MG tablet, Take 1 tablet (10 mg total) by mouth daily., Disp: 90 tablet, Rfl: 0 Social History   Socioeconomic History  . Marital status: Married    Spouse name: Not on file  . Number of children: 3  . Years of education: Not on file  . Highest education level: Not on file  Occupational History  . Occupation: unemployed  Social Needs  . Financial resource strain: Not on file  . Food insecurity:    Worry: Not on file    Inability: Not on file  . Transportation needs:    Medical: Not on file    Non-medical: Not on file    Tobacco Use  . Smoking status: Never Smoker  . Smokeless tobacco: Never Used  Substance and Sexual Activity  . Alcohol use: No  . Drug use: No  . Sexual activity: Yes  Lifestyle  . Physical activity:    Days per week: Not on file    Minutes per session: Not on file  . Stress: Not on file  Relationships  . Social connections:    Talks on phone: Not on file    Gets together: Not on file    Attends religious service: Not on file    Active member of club or organization: Not on file    Attends meetings of clubs or organizations: Not on file    Relationship status: Not on file  . Intimate partner violence:    Fear of current or ex partner: Not on file    Emotionally abused: Not on file    Physically abused: Not on file    Forced sexual activity: Not on file  Other Topics Concern  . Not on file  Social History Narrative   Lives at home home with husband with son, daughter in law and 3 children.    Disabled.   Family History  Problem Relation Age of Onset  . Arthritis Mother   . Diabetes Mother   . CVA Mother   . Stroke Mother   . Diabetes Father   . Heart disease Father        CABG.  Does not know age of onset  . Asthma Father   . Hepatitis C Sister   . Arthritis Brother   . Cancer Brother        metastic cancer    Objective: Office vital signs reviewed. There were no vitals taken for this visit.  Physical Examination:  General: Awake, alert, *** nourished, No acute distress HEENT: Normal    Neck: No masses palpated. No lymphadenopathy    Ears: Tympanic membranes intact, normal light reflex, no erythema, no bulging    Eyes: PERRLA, extraocular membranes intact, sclera ***    Nose: nasal turbinates moist, *** nasal discharge    Throat: moist mucus membranes, no erythema, *** tonsillar exudate.  Airway is patent Cardio: regular rate and rhythm, S1S2 heard, no murmurs appreciated Pulm: clear to auscultation bilaterally, no wheezes, rhonchi or rales; normal work of  breathing on room air GI: soft, non-tender, non-distended, bowel sounds present x4, no hepatomegaly, no splenomegaly, no masses GU: external vaginal tissue ***, cervix ***, *** punctate lesions on cervix appreciated, *** discharge from cervical os, *** bleeding, *** cervical motion tenderness, *** abdominal/ adnexal  masses Extremities: warm, well perfused, No edema, cyanosis or clubbing; +*** pulses bilaterally MSK: *** gait and *** station Skin: dry; intact; no rashes or lesions Neuro: *** Strength and light touch sensation grossly intact, *** DTRs ***/4  Assessment/ Plan: 67 y.o. female   ***  No orders of the defined types were placed in this encounter.  No orders of the defined types were placed in this encounter.    Janora Norlander, DO [place] [phone]

## 2018-04-24 NOTE — Progress Notes (Signed)
   Subjective:    Patient ID: Yvonne [name], female    DOB: Oct 22, 1951, 67 y.o.   MRN: [id number]  Chief Complaint  Patient presents with  . Abdominal Pain    worse in right lower    Abdominal Pain  This is a new problem. The current episode started in the past 7 days. The onset quality is gradual. The problem occurs constantly. The problem has been gradually worsening. The pain is located in the RLQ. The pain is at a severity of 9/10. The pain is moderate. The quality of the pain is sharp. The abdominal pain does not radiate. Associated symptoms include belching, diarrhea, frequency, nausea and vomiting. Pertinent negatives include no dysuria or hematuria. The pain is aggravated by certain positions and movement. The pain is relieved by being still. She has tried acetaminophen for the symptoms. The treatment provided no relief.      Review of Systems  Gastrointestinal: Positive for abdominal pain, diarrhea, nausea and vomiting.  Genitourinary: Positive for frequency. Negative for dysuria and hematuria.  All other systems reviewed and are negative.      Objective:   Physical Exam  Constitutional: She is oriented to person, place, and time. She appears well-developed and well-nourished. No distress.  HENT:  Head: Normocephalic and atraumatic.  Right Ear: External ear normal.  Mouth/Throat: Oropharynx is clear and moist.  Eyes: Pupils are equal, round, and reactive to light.  Neck: Normal range of motion. Neck supple. No thyromegaly present.  Cardiovascular: Normal rate, regular rhythm, normal heart sounds and intact distal pulses.  No murmur heard. Pulmonary/Chest: Effort normal and breath sounds normal. No respiratory distress. She has no wheezes.  Abdominal: Soft. Bowel sounds are normal. She exhibits no distension. There is tenderness in the right lower quadrant and left lower quadrant. There is rebound. There is no guarding.  Musculoskeletal: Normal range of motion. She exhibits  no edema or tenderness.  Neurological: She is alert and oriented to person, place, and time. She has normal reflexes. No cranial nerve deficit.  Skin: Skin is warm and dry.  Psychiatric: She has a normal mood and affect. Her behavior is normal. Judgment and thought content normal.  Vitals reviewed.     BP 137/77   Pulse 75   Temp 98 F (36.7 C) (Oral)   Ht 5\' 2"  (1.575 m)   Wt 183 lb (83 kg)   BMI 33.47 kg/m      Assessment & Plan:  Tamiki was seen today for abdominal pain.  Diagnoses and all orders for this visit:  Acute cystitis with hematuria -     Urine Culture -     CBC with Differential/Platelet  Right lower quadrant abdominal pain -     Urinalysis, Complete -     Urine Culture -     CBC with Differential/Platelet  Diverticulitis -     ciprofloxacin (CIPRO) 500 MG tablet; Take 1 tablet (500 mg total) by mouth 2 (two) times daily. -     metroNIDAZOLE (FLAGYL) 500 MG tablet; Take 1 tablet (500 mg total) by mouth 3 (three) times daily.   Force fluids CBC pending Will treat as diverticulitis and UTI Full liquid diet Red flags discussed and RTO if pain worsens or does not improve  Evelina Dun, FNP

## 2018-04-24 NOTE — Patient Instructions (Addendum)

## 2018-04-25 LAB — CBC WITH DIFFERENTIAL/PLATELET
BASOS ABS: 0 10*3/uL (ref 0.0–0.2)
BASOS: 0 %
EOS (ABSOLUTE): 0.1 10*3/uL (ref 0.0–0.4)
Eos: 2 %
HEMOGLOBIN: 12.7 g/dL (ref 11.1–15.9)
Hematocrit: 37.1 % (ref 34.0–46.6)
Immature Grans (Abs): 0 10*3/uL (ref 0.0–0.1)
Immature Granulocytes: 0 %
LYMPHS ABS: 2.6 10*3/uL (ref 0.7–3.1)
Lymphs: 37 %
MCH: 29.9 pg (ref 26.6–33.0)
MCHC: 34.2 g/dL (ref 31.5–35.7)
MCV: 87 fL (ref 79–97)
MONOCYTES: 8 %
Monocytes Absolute: 0.5 10*3/uL (ref 0.1–0.9)
NEUTROS ABS: 3.7 10*3/uL (ref 1.4–7.0)
Neutrophils: 53 %
Platelets: 204 10*3/uL (ref 150–450)
RBC: 4.25 x10E6/uL (ref 3.77–5.28)
RDW: 13.8 % (ref 12.3–15.4)
WBC: 7 10*3/uL (ref 3.4–10.8)

## 2018-04-26 LAB — URINE CULTURE

## 2018-05-12 ENCOUNTER — Telehealth: Payer: Self-pay

## 2018-05-12 ENCOUNTER — Other Ambulatory Visit: Payer: Self-pay | Admitting: Family Medicine

## 2018-05-12 NOTE — Telephone Encounter (Signed)
Writer is unable to make contact with the patient.  Writer has made several attempts ton follow up with the patient.  Writer will make this patient inactive.  Writer routed this information to PCP and Dr. Modesta Messing

## 2018-05-19 DIAGNOSIS — Z79899 Other long term (current) drug therapy: Secondary | ICD-10-CM | POA: Diagnosis not present

## 2018-05-19 DIAGNOSIS — F1190 Opioid use, unspecified, uncomplicated: Secondary | ICD-10-CM | POA: Diagnosis not present

## 2018-05-19 DIAGNOSIS — M7918 Myalgia, other site: Secondary | ICD-10-CM | POA: Diagnosis not present

## 2018-05-19 DIAGNOSIS — M5442 Lumbago with sciatica, left side: Secondary | ICD-10-CM | POA: Diagnosis not present

## 2018-05-19 DIAGNOSIS — Z5181 Encounter for therapeutic drug level monitoring: Secondary | ICD-10-CM | POA: Diagnosis not present

## 2018-05-19 DIAGNOSIS — G894 Chronic pain syndrome: Secondary | ICD-10-CM | POA: Diagnosis not present

## 2018-05-22 ENCOUNTER — Telehealth: Payer: Self-pay | Admitting: Family Medicine

## 2018-05-22 NOTE — Telephone Encounter (Signed)
Is it ok for pt to have filled 05/23/18? She states she is going out of town Saturday.

## 2018-05-23 ENCOUNTER — Ambulatory Visit (INDEPENDENT_AMBULATORY_CARE_PROVIDER_SITE_OTHER): Payer: Medicare HMO | Admitting: Family Medicine

## 2018-05-23 ENCOUNTER — Encounter: Payer: Self-pay | Admitting: Family Medicine

## 2018-05-23 VITALS — BP 161/88 | HR 72 | Temp 97.9°F | Ht 62.0 in | Wt 183.1 lb

## 2018-05-23 DIAGNOSIS — H66006 Acute suppurative otitis media without spontaneous rupture of ear drum, recurrent, bilateral: Secondary | ICD-10-CM | POA: Diagnosis not present

## 2018-05-23 MED ORDER — CLARITHROMYCIN 500 MG PO TABS: 500.0000 mg | ORAL_TABLET | Freq: Two times a day (BID) | ORAL | 0 refills | Status: AC

## 2018-05-23 NOTE — Progress Notes (Signed)
Chief Complaint  Patient presents with  . Ear Pain    pt here today c/o bilateral ear pain    HPI  Patient presents today for 3 days of increasing pain in the ears.  Right hurts worse than the left.  She is also had some vertigo with it as well as some hot flashes.  This morning she got up out of bed and things started moving around a bit that is why she decided to come in.  Additionally the patient is a diabetic and she says that her sugars have been a bit up and down leading me to feel we should avoid steroids.  She has tubes in place in both ears from arch of this year with her ENT doctor.  PMH: Smoking status noted ROS: Per HPI  Objective: BP (!) 161/88   Pulse 72   Temp 97.9 F (36.6 C) (Oral)   Ht 5\' 2"  (1.575 m)   Wt 183 lb 2 oz (83.1 kg)   BMI 33.49 kg/m  Gen: NAD, alert, cooperative with exam HEENT: NCAT, EOMI, PERRL.  Tubes in place bilaterally.  Fluid and hyperemia noted at the TMs CV: RRR, good S1/S2, no murmur Resp: CTABL, no wheezes, non-labored Abd: SNTND, BS present, no guarding or organomegaly Ext: No edema, warm Neuro: Alert and oriented, No gross deficits  Assessment and plan:  1. Recurrent acute suppurative otitis media without spontaneous rupture of tympanic membrane of both sides     Meds ordered this encounter  Medications  . clarithromycin (BIAXIN) 500 MG tablet    Sig: Take 1 tablet (500 mg total) by mouth 2 (two) times daily for 10 days.    Dispense:  20 tablet    Refill:  0    Follow up as needed.  [name], MD

## 2018-05-23 NOTE — Patient Instructions (Signed)
Do not take the atorvastatin until you finish the antibiotic.

## 2018-06-06 ENCOUNTER — Ambulatory Visit (INDEPENDENT_AMBULATORY_CARE_PROVIDER_SITE_OTHER): Payer: Medicare HMO | Admitting: Family Medicine

## 2018-06-06 ENCOUNTER — Emergency Department (HOSPITAL_COMMUNITY): Payer: Medicare HMO

## 2018-06-06 ENCOUNTER — Emergency Department (HOSPITAL_COMMUNITY)
Admission: EM | Admit: 2018-06-06 | Discharge: 2018-06-06 | Disposition: A | Discharge status: Home / Self Care | Payer: Medicare HMO | Attending: Emergency Medicine | Admitting: Emergency Medicine

## 2018-06-06 ENCOUNTER — Encounter: Payer: Self-pay | Admitting: Family Medicine

## 2018-06-06 ENCOUNTER — Encounter (HOSPITAL_COMMUNITY): Payer: Self-pay | Admitting: *Deleted

## 2018-06-06 ENCOUNTER — Other Ambulatory Visit: Payer: Self-pay

## 2018-06-06 VITALS — BP 139/82 | HR 82 | Temp 97.7°F | Ht 62.0 in | Wt 182.2 lb

## 2018-06-06 DIAGNOSIS — I5032 Chronic diastolic (congestive) heart failure: Secondary | ICD-10-CM | POA: Insufficient documentation

## 2018-06-06 DIAGNOSIS — J45909 Unspecified asthma, uncomplicated: Secondary | ICD-10-CM | POA: Insufficient documentation

## 2018-06-06 DIAGNOSIS — I639 Cerebral infarction, unspecified: Secondary | ICD-10-CM

## 2018-06-06 DIAGNOSIS — E119 Type 2 diabetes mellitus without complications: Secondary | ICD-10-CM | POA: Insufficient documentation

## 2018-06-06 DIAGNOSIS — I69351 Hemiplegia and hemiparesis following cerebral infarction affecting right dominant side: Secondary | ICD-10-CM | POA: Diagnosis not present

## 2018-06-06 DIAGNOSIS — Z7984 Long term (current) use of oral hypoglycemic drugs: Secondary | ICD-10-CM | POA: Diagnosis not present

## 2018-06-06 DIAGNOSIS — I110 Hypertensive heart disease with heart failure: Secondary | ICD-10-CM | POA: Insufficient documentation

## 2018-06-06 DIAGNOSIS — R531 Weakness: Secondary | ICD-10-CM

## 2018-06-06 DIAGNOSIS — F419 Anxiety disorder, unspecified: Secondary | ICD-10-CM | POA: Insufficient documentation

## 2018-06-06 DIAGNOSIS — Z7982 Long term (current) use of aspirin: Secondary | ICD-10-CM | POA: Diagnosis not present

## 2018-06-06 DIAGNOSIS — Z79899 Other long term (current) drug therapy: Secondary | ICD-10-CM | POA: Diagnosis not present

## 2018-06-06 DIAGNOSIS — R51 Headache: Secondary | ICD-10-CM | POA: Diagnosis not present

## 2018-06-06 DIAGNOSIS — R197 Diarrhea, unspecified: Secondary | ICD-10-CM | POA: Diagnosis not present

## 2018-06-06 LAB — COMPREHENSIVE METABOLIC PANEL
ALBUMIN: 3.6 g/dL (ref 3.5–5.0)
ALK PHOS: 99 U/L (ref 38–126)
ALT: 17 U/L (ref 0–44)
AST: 9 U/L — AB (ref 15–41)
Anion gap: 9 (ref 5–15)
BUN: 16 mg/dL (ref 8–23)
CALCIUM: 9.7 mg/dL (ref 8.9–10.3)
CHLORIDE: 103 mmol/L (ref 98–111)
CO2: 24 mmol/L (ref 22–32)
CREATININE: 1.04 mg/dL — AB (ref 0.44–1.00)
GFR calc Af Amer: >60 mL/min (ref >60)
GFR calc non Af Amer: 54 mL/min — ABNORMAL LOW (ref >60)
GLUCOSE: 227 mg/dL — AB (ref 70–99)
Potassium: 3.5 mmol/L (ref 3.5–5.1)
SODIUM: 136 mmol/L (ref 135–145)
Total Bilirubin: 0.6 mg/dL (ref 0.3–1.2)
Total Protein: 6.6 g/dL (ref 6.5–8.1)

## 2018-06-06 LAB — I-STAT CHEM 8, ED
BUN: 18 mg/dL (ref 8–23)
CHLORIDE: 101 mmol/L (ref 98–111)
CREATININE: 0.9 mg/dL (ref 0.44–1.00)
Calcium, Ion: 1.21 mmol/L (ref 1.15–1.40)
Glucose, Bld: 223 mg/dL — ABNORMAL HIGH (ref 70–99)
HEMATOCRIT: 38 % (ref 36.0–46.0)
Hemoglobin: 12.9 g/dL (ref 12.0–15.0)
Potassium: 3.4 mmol/L — ABNORMAL LOW (ref 3.5–5.1)
Sodium: 137 mmol/L (ref 135–145)
TCO2: 25 mmol/L (ref 22–32)

## 2018-06-06 LAB — CBC
HEMATOCRIT: 40.9 % (ref 36.0–46.0)
HEMOGLOBIN: 13.6 g/dL (ref 12.0–15.0)
MCH: 29.2 pg (ref 26.0–34.0)
MCHC: 33.3 g/dL (ref 30.0–36.0)
MCV: 88 fL (ref 78.0–100.0)
Platelets: 273 10*3/uL (ref 150–400)
RBC: 4.65 MIL/uL (ref 3.87–5.11)
RDW: 13 % (ref 11.5–15.5)
WBC: 8.2 10*3/uL (ref 4.0–10.5)

## 2018-06-06 LAB — DIFFERENTIAL
ABS IMMATURE GRANULOCYTES: 0 10*3/uL (ref 0.0–0.1)
BASOS PCT: 1 %
Basophils Absolute: 0 10*3/uL (ref 0.0–0.1)
Eosinophils Absolute: 0.1 10*3/uL (ref 0.0–0.7)
Eosinophils Relative: 1 %
IMMATURE GRANULOCYTES: 0 %
LYMPHS PCT: 36 %
Lymphs Abs: 2.9 10*3/uL (ref 0.7–4.0)
Monocytes Absolute: 0.6 10*3/uL (ref 0.1–1.0)
Monocytes Relative: 7 %
NEUTROS ABS: 4.5 10*3/uL (ref 1.7–7.7)
Neutrophils Relative %: 55 %

## 2018-06-06 LAB — PROTIME-INR
INR: 0.95
Prothrombin Time: 12.5 seconds (ref 11.4–15.2)

## 2018-06-06 LAB — I-STAT TROPONIN, ED: Troponin i, poc: 0 ng/mL (ref 0.00–0.08)

## 2018-06-06 LAB — CBG MONITORING, ED: GLUCOSE-CAPILLARY: 139 mg/dL — AB (ref 70–99)

## 2018-06-06 LAB — APTT: APTT: 33 s (ref 24–36)

## 2018-06-06 MED ORDER — DIAZEPAM 5 MG PO TABS
5.0000 mg | ORAL_TABLET | Freq: Once | ORAL | Status: AC
  Administered 2018-06-06: 5 mg via ORAL
  Filled 2018-06-06: qty 1

## 2018-06-06 MED ORDER — LORAZEPAM 2 MG/ML IJ SOLN
1.0000 mg | Freq: Once | INTRAMUSCULAR | Status: AC
Start: 2018-06-06 — End: 2018-06-06
  Administered 2018-06-06: 1 mg via INTRAVENOUS
  Filled 2018-06-06: qty 1

## 2018-06-06 NOTE — ED Triage Notes (Signed)
Pt in c/o right sided weakness and decreased sensation to her right arm since Tuesday, hx of stroke and states she thinks she had another. Went to her PCP today and was sent here, alert and oriented, able to move all extremities but right arm and leg and weaker than left side

## 2018-06-06 NOTE — ED Notes (Signed)
MRI called stating patient needed more medication to complete MRI. MD notified and orders placed.

## 2018-06-06 NOTE — ED Notes (Signed)
Patient transported to MRI 

## 2018-06-06 NOTE — ED Provider Notes (Signed)
[facility] EMERGENCY DEPARTMENT Provider Note   CSN: [id number] Arrival date & time: 06/06/18  1329  History   Chief Complaint Chief Complaint  Patient presents with  . Weakness   HPI  Patient is a 68 year old female with history of prior CVA with residual right-sided deficits, hypertension, and diabetes presents to the ED for worsening right-sided weakness. She states she had onset of right arm and leg weakness 4 days ago while at rest associated with headache. Her symptoms persisted and were worse upon awakening this morning, prompting ED visit. No vision or speech changes. No recent head injury. Able to walk without difficulty today. No fever or vomiting. She states she has had mild watery diarrhea which is chronic.   Past Medical History:  Diagnosis Date  . Anxiety   . Arthritis    left hand  . Asthma    daily and prn inhalers  . Chronic cough   . Chronic otitis media 12/2017  . Full dentures   . GERD (gastroesophageal reflux disease)   . History of stroke 09/2017   weakness right hand, numbness right side face  . Hyperlipidemia   . Hypertension    states under control with meds., has been on med. x a long time, per pt.  . Non-insulin dependent type 2 diabetes mellitus ([place])   . Overactive bladder     Patient Active Problem List   Diagnosis Date Noted  . Chronic migraine without aura without status migrainosus, not intractable 02/19/2018  . Recurrent acute suppurative otitis media without spontaneous rupture of left tympanic membrane 02/19/2018  . Epidermoid cyst of finger 12/04/2017  . Cyst of ear canal 12/04/2017  . Transient cerebral ischemia   . Stroke (cerebrum) ([place]) 03/21/2017  . Chronic diastolic CHF (congestive heart failure) ([place]) 03/21/2017  . 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 Lethargy   . Weakness 09/28/2016  . Pain of both hip joints 06/18/2016  . Pain syndrome,  chronic 06/18/2016  . Chronic cough 05/17/2016  . Hyperlipidemia with target LDL less than 70 11/04/2015  . Macular hole of right eye 09/14/2015  . Obesity 07/20/2015  . Osteopenia 07/20/2015  . Type 2 diabetes mellitus ([place]) 07/20/2015  . Overactive bladder   . Hypertension   . Anxiety   . Acid reflux disease 08/17/2014    Past Surgical History:  Procedure Laterality Date  . ABDOMINAL HYSTERECTOMY     complete  . CATARACT EXTRACTION W/ INTRAOCULAR LENS IMPLANT Right   . CHOLECYSTECTOMY    . COLONOSCOPY N/A 08/17/2015   Procedure: COLONOSCOPY;  Surgeon: Rogene Houston, MD;  Location: [facility];  Service: Endoscopy;  Laterality: N/A;  200 - moved to 7:30 - Ann notified pt  . GAS INSERTION Right    x 2 - eye  . LACRIMAL DUCT EXPLORATION Bilateral    removal of tear ducts  . MYRINGOTOMY WITH TUBE PLACEMENT Bilateral 01/28/2018   Procedure: BILATERAL MYRINGOTOMY WITH TUBE PLACEMENT;  Surgeon: Leta Baptist, MD;  Location: [facility];  Service: ENT;  Laterality: Bilateral;  . VITRECTOMY Left 09/14/2015     OB History   None      Home Medications    Prior to Admission medications   Medication Sig Start Date End Date Taking? Authorizing Provider  albuterol (VENTOLIN HFA) 108 (90 Base) MCG/ACT inhaler Inhale 2 puffs into the lungs every 6 (six) hours as needed for wheezing or shortness of breath. 12/31/17  Yes [name], MD  ALPRAZolam Duanne Moron) 1 MG tablet TAKE 1 TABLET ('1MG'$  TOTAL) 3 (THREE)TIMES DAILY BY MOUTH Patient taking differently: Take 1 mg by mouth three times a day as needed for anxiety 03/27/18  Yes [name], [name], MD  amLODipine (NORVASC) 5 MG tablet TAKE 1 TABLET (5 MG TOTAL) BY MOUTH DAILY. 12/31/17  Yes [name], MD  aspirin EC 81 MG tablet Take 81 mg by mouth daily.   Yes [provider]  atorvastatin (LIPITOR) 40 MG tablet TAKE 1 TABLET EVERY DAY AT 6PM 01/01/18  Yes [name], [name], MD  fexofenadine [facility] ALLERGY) 180 MG tablet Take 1  tablet (180 mg total) by mouth daily. Patient taking differently: Take 180 mg by mouth daily as needed for allergies.  09/06/17  Yes [name], [name], MD  fluticasone furoate-vilanterol (BREO ELLIPTA) 100-25 MCG/INH AEPB Inhale 1 puff into the lungs daily. 12/31/17  Yes [name], MD  furosemide (LASIX) 20 MG tablet Take 1 tablet (20 mg total) by mouth daily. 12/26/17  Yes [name], MD  gabapentin (NEURONTIN) 300 MG capsule Take 2 capsules (600 mg total) by mouth 4 (four) times daily. Patient taking differently: Take 300 mg by mouth at bedtime.  12/31/17  Yes [name], MD  HYDROcodone-acetaminophen (NORCO) 10-325 MG tablet Take 1 tablet by mouth every 6 (six) hours as needed for moderate pain. Reported on 02/07/2016 Patient taking differently: Take 1 tablet by mouth every 8 (eight) hours as needed for moderate pain. Reported on 02/07/2016 02/07/16  Yes [name], [name], MD  metFORMIN (GLUCOPHAGE-XR) 750 MG 24 hr tablet TAKE 1 TABLET (750 MG TOTAL) BY MOUTH DAILY WITH BREAKFAST. 03/25/18  Yes [name], [name], MD  metoprolol succinate (TOPROL-XL) 50 MG 24 hr tablet TAKE 1 TABLET (50 MG TOTAL) BY MOUTH DAILY. TAKE WITH OR IMMEDIATELY FOLLOWING A MEAL. 05/12/18  Yes [name], MD  pantoprazole (PROTONIX) 40 MG tablet Take 1 tablet (40 mg total) by mouth 2 (two) times daily. For stomach 12/31/17  Yes [name], [name], MD  REMIFEMIN 20 MG TABS Take 1 tablet (20 mg total) by mouth daily. 02/07/18  Yes [name], MD  solifenacin (VESICARE) 10 MG tablet Take 1 tablet (10 mg total) by mouth daily. Patient taking differently: Take 10 mg by mouth daily as needed (for overactive bladder symptoms).  12/31/17  Yes [name], MD  aspirin 325 MG tablet Take 1 tablet (325 mg total) by mouth daily. Patient not taking: Reported on 06/06/2018 03/23/17   Rosita Fire, MD  blood glucose meter kit and supplies Use as directed 01/01/18   [name], MD  ezetimibe (ZETIA) 10 MG tablet Take 1 tablet (10 mg  total) by mouth daily. For cholesterol 06/25/17   [name], MD  glucose blood test strip Test once daily. 01/01/18   [name], MD  LANCETS ULTRA THIN MISC Test once daily. 01/01/18   [name], MD    Family History Family History  Problem Relation Age of Onset  . Arthritis Mother   . Diabetes Mother   . CVA Mother   . Stroke Mother   . Diabetes Father   . Heart disease Father        CABG.  Does not know age of onset  . Asthma Father   . Hepatitis C Sister   . Arthritis Brother   . Cancer Brother        metastic cancer    Social History Social History   Tobacco Use  . Smoking status: Never  Smoker  . Smokeless tobacco: Never Used  Substance Use Topics  . Alcohol use: No  . Drug use: No     Allergies   Penicillins; Gabapentin; Lisinopril; and Soap   Review of Systems Review of Systems  Constitutional: Negative for fever.  HENT: Negative for congestion and sore throat.   Eyes: Negative for visual disturbance.  Respiratory: Negative for shortness of breath.   Cardiovascular: Negative for chest pain.  Gastrointestinal: Positive for diarrhea. Negative for abdominal pain and vomiting.  Genitourinary: Negative for dysuria.  Musculoskeletal: Negative for neck pain.  Neurological: Positive for weakness and headaches. Negative for syncope, facial asymmetry and speech difficulty.  All other systems reviewed and are negative.    Physical Exam Updated Vital Signs BP (!) 172/78 (BP Location: Right Arm)   Pulse 74   Temp 98.3 F (36.8 C) (Oral)   Resp 17   Ht '5\' 2"'$  (1.575 m)   Wt 82.6 kg (182 lb 3.2 oz)   SpO2 98%   BMI 33.32 kg/m   Physical Exam  Constitutional: She is oriented to person, place, and time. No distress.  HENT:  Head: Normocephalic and atraumatic.  Mouth/Throat: Oropharynx is clear and moist.  Eyes: Pupils are equal, round, and reactive to light. Conjunctivae are normal.  Neck: Neck supple. No tracheal deviation present.    Cardiovascular: Normal rate, regular rhythm, normal heart sounds and intact distal pulses.  No murmur heard. Pulmonary/Chest: Effort normal and breath sounds normal. No stridor. No respiratory distress. She has no wheezes. She has no rales.  Abdominal: Soft. She exhibits no distension and no mass. There is no tenderness. There is no guarding.  Musculoskeletal: She exhibits no edema or deformity.  Neurological: She is alert and oriented to person, place, and time.  Speech is fluent. CN II-12 intact. 4/5 strength in RUE and RLE, 5/5 strength in LUE and LLE. No dysmetria on finger-to-nose testing. Normal gait. Negative Romberg.  Skin: Skin is warm and dry.  Psychiatric: She has a normal mood and affect. Her behavior is normal.  Nursing note and vitals reviewed.    ED Treatments / Results  Labs (all labs ordered are listed, but only abnormal results are displayed) Labs Reviewed  COMPREHENSIVE METABOLIC PANEL - Abnormal; Notable for the following components:      Result Value   Glucose, Bld 227 (*)    Creatinine, Ser 1.04 (*)    AST 9 (*)    GFR calc non Af Amer 54 (*)    All other components within normal limits  CBG MONITORING, ED - Abnormal; Notable for the following components:   Glucose-Capillary 139 (*)    All other components within normal limits  I-STAT CHEM 8, ED - Abnormal; Notable for the following components:   Potassium 3.4 (*)    Glucose, Bld 223 (*)    All other components within normal limits  PROTIME-INR  APTT  CBC  DIFFERENTIAL  I-STAT TROPONIN, ED    EKG EKG Interpretation  Date/Time:  Friday June 06 2018 13:33:16 EDT Ventricular Rate:  83 PR Interval:  166 QRS Duration: 82 QT Interval:  386 QTC Calculation: 453 R Axis:   11 Text Interpretation:  Normal sinus rhythm Anterior infarct , age undetermined T wave abnormality Artifact No significant change since last tracing Abnormal ekg Confirmed by Carmin Muskrat [phone]) on 06/06/2018 3:07:58  PM   Radiology Ct Head Wo Contrast  Result Date: 06/06/2018 CLINICAL DATA:  67 y/o F; increased tingling and weakness in the  right arm and leg onset 06/03/2018. EXAM: CT HEAD WITHOUT CONTRAST TECHNIQUE: Contiguous axial images were obtained from the base of the skull through the vertex without intravenous contrast. COMPARISON:  None. FINDINGS: Brain: No evidence of acute infarction, hemorrhage, hydrocephalus, extra-axial collection or mass lesion/mass effect. Stable small chronic lacunar infarct in the left lateral thalamus. Vascular: Calcific atherosclerosis of the carotid siphons and vertebral arteries. No hyperdense vessel identified. Skull: Normal. Negative for fracture or focal lesion. Sinuses/Orbits: Chronic left maxillary sinus disease. Normal aeration of mastoid air cells. Right intra-ocular lens replacement. Other: None. IMPRESSION: Stable negative CT of the head. Stable chronic lacunar infarct in the left thalamus. Electronically Signed   By: Kristine Garbe M.D.   On: 06/06/2018 14:39   Mr Jodene Nam Head Wo Contrast  Result Date: 06/06/2018 CLINICAL DATA:  67 y/o F; right-sided weakness and decreased sensation to her right arm. EXAM: MRI HEAD WITHOUT CONTRAST MRA HEAD WITHOUT CONTRAST TECHNIQUE: Multiplanar, multiecho pulse sequences of the brain and surrounding structures were obtained without intravenous contrast. Angiographic images of the head were obtained using MRA technique without contrast. COMPARISON:  None. 06/06/2018 CT head. 05/16/2017 MRI of the head. 03/21/2017 CT angiogram of the head. FINDINGS: MRI HEAD FINDINGS Brain: No acute infarction, hemorrhage, hydrocephalus, extra-axial collection or mass lesion. Stable small chronic lacunar infarct within the left thalamus. Minimal stable chronic microvascular ischemic changes and parenchymal volume loss of the brain. Vascular: Normal flow voids. Skull and upper cervical spine: Normal marrow signal. Sinuses/Orbits: Chronic left  maxillary sinus disease and atelectasis. No abnormal signal of the mastoid air cells. Right intra-ocular lens replacement. Other: None. MRA HEAD FINDINGS Internal carotid arteries: Patent. Lumen irregularity of the carotid siphons compatible with atherosclerosis. No significant stenosis. Anterior cerebral arteries:  Patent. Middle cerebral arteries: Patent. Anterior communicating artery: Patent. Posterior communicating arteries: Not identified, likely hypoplastic or absent. Posterior cerebral arteries:  Patent. Basilar artery:  Patent. Vertebral arteries:  Patent. No evidence of high-grade stenosis, large vessel occlusion, or aneurysm identified. IMPRESSION: 1. No acute intracranial abnormality identified. 2. Patent anterior and posterior intracranial circulation. No large vessel occlusion, aneurysm, or significant stenosis. 3. Stable small chronic lacunar infarct in left thalamus. Electronically Signed   By: Kristine Garbe M.D.   On: 06/06/2018 19:27   Mr Brain Wo Contrast  Result Date: 06/06/2018 CLINICAL DATA:  67 y/o F; right-sided weakness and decreased sensation to her right arm. EXAM: MRI HEAD WITHOUT CONTRAST MRA HEAD WITHOUT CONTRAST TECHNIQUE: Multiplanar, multiecho pulse sequences of the brain and surrounding structures were obtained without intravenous contrast. Angiographic images of the head were obtained using MRA technique without contrast. COMPARISON:  None. 06/06/2018 CT head. 05/16/2017 MRI of the head. 03/21/2017 CT angiogram of the head. FINDINGS: MRI HEAD FINDINGS Brain: No acute infarction, hemorrhage, hydrocephalus, extra-axial collection or mass lesion. Stable small chronic lacunar infarct within the left thalamus. Minimal stable chronic microvascular ischemic changes and parenchymal volume loss of the brain. Vascular: Normal flow voids. Skull and upper cervical spine: Normal marrow signal. Sinuses/Orbits: Chronic left maxillary sinus disease and atelectasis. No abnormal signal  of the mastoid air cells. Right intra-ocular lens replacement. Other: None. MRA HEAD FINDINGS Internal carotid arteries: Patent. Lumen irregularity of the carotid siphons compatible with atherosclerosis. No significant stenosis. Anterior cerebral arteries:  Patent. Middle cerebral arteries: Patent. Anterior communicating artery: Patent. Posterior communicating arteries: Not identified, likely hypoplastic or absent. Posterior cerebral arteries:  Patent. Basilar artery:  Patent. Vertebral arteries:  Patent. No evidence of high-grade stenosis, large vessel  occlusion, or aneurysm identified. IMPRESSION: 1. No acute intracranial abnormality identified. 2. Patent anterior and posterior intracranial circulation. No large vessel occlusion, aneurysm, or significant stenosis. 3. Stable small chronic lacunar infarct in left thalamus. Electronically Signed   By: Kristine Garbe M.D.   On: 06/06/2018 19:27    Procedures Procedures (including critical care time)  Medications Ordered in ED Medications  diazepam (VALIUM) tablet 5 mg (5 mg Oral Given 06/06/18 1753)  LORazepam (ATIVAN) injection 1 mg (1 mg Intravenous Given 06/06/18 1835)     Initial Impression / Assessment and Plan / ED Course  I have reviewed the triage vital signs and the nursing notes.  Pertinent labs & imaging results that were available during my care of the patient were reviewed by me and considered in my medical decision making (see chart for details).  Patient worsening for 5 days of right-sided weakness as above. CT head shows no acute findings. MRI brain obtained which showed no evidence of acute infarct. May be reactivation of old stroke given same distribution of symptoms. Screening labs unremarkable. Given persistence of symptoms, TIA felt less likely. Her last TTE and CT neck last year. Do not feel admission for medical optimization would be helpful in this case. On recheck, patient resting completely. We'll discharge home with  neurology follow-up.  Patient will ED findings. Return precautions and follow up plans discussed. She had two doses of aspirin listed on her med list and advised her to take full dose aspirin until neurology follow-up. All questions answered.  Final Clinical Impressions(s) / ED Diagnoses   Final diagnoses:  Weakness     Prescilla Sours, MD 06/07/18 1443    Carmin Muskrat, MD 06/09/18 0030

## 2018-06-10 ENCOUNTER — Encounter: Payer: Self-pay | Admitting: Family Medicine

## 2018-06-10 NOTE — Progress Notes (Signed)
Subjective:  Patient ID: Yvonne [name], female    DOB: 28-Apr-1951  Age: 67 y.o. MRN: [id number]  CC: right side weakness , began tuesday   HPI Yvonne [name] presents for onset 4 days ago of right-sided weakness.  She feels numbness in the face and weakness in the arm.  It has affected her ability to ambulate.  It seemed to hit her fairly suddenly on Tuesday morning she felt that it was similar to the stroke she had previously but she decided not to seek immediate care.  Since it has not gone away and 3 days she decided to come on into the office today.  She is hardly able to walk due to the weakness of the leg.  She is able to talk plainly even though this right side of her face feels weak and numb.  She does have some movement of the arm although she cannot grip very well.  Of note is that the patient had a resolving ischemic neurologic deficit approximately 1 year ago with similar symptoms that resolved in approximately 3 to 4 days.  These were observed to be strokelike when hospitalized for ostensible stroke but again resolved in less than 5 days.  Depression screen [facility] 2/9 06/06/2018 04/24/2018 03/22/2018  Decreased Interest '2 2 2  '$ Down, Depressed, Hopeless '2 1 3  '$ PHQ - 2 Score '4 3 5  '$ Altered sleeping '3 2 3  '$ Tired, decreased energy '2 2 3  '$ Change in appetite '3 3 3  '$ Feeling bad or failure about yourself  2 0 3  Trouble concentrating 0 0 2  Moving slowly or fidgety/restless 0 0 0  Suicidal thoughts 0 0 0  PHQ-9 Score '14 10 19  '$ Some recent data might be hidden    History Yvonne [name] has a past medical history of Anxiety, Arthritis, Asthma, Chronic cough, Chronic otitis media (12/2017), Full dentures, GERD (gastroesophageal reflux disease), History of stroke (09/2017), Hyperlipidemia, Hypertension, Non-insulin dependent type 2 diabetes mellitus ([place]), and Overactive bladder.   She has a past surgical history that includes Cholecystectomy; Colonoscopy (N/A, 08/17/2015); Abdominal hysterectomy;  Lacrimal duct exploration (Bilateral); Cataract extraction w/ intraocular lens implant (Right); Vitrectomy (Left, 09/14/2015); Gas insertion (Right); and Myringotomy with tube placement (Bilateral, 01/28/2018).   Her family history includes Arthritis in her brother and mother; Asthma in her father; CVA in her mother; Cancer in her brother; Diabetes in her father and mother; Heart disease in her father; Hepatitis C in her sister; Stroke in her mother.She reports that she has never smoked. She has never used smokeless tobacco. She reports that she does not drink alcohol or use drugs.    ROS Review of Systems  Constitutional: Positive for activity change. Negative for appetite change.  HENT: Negative.  Negative for facial swelling and trouble swallowing.   Eyes: Negative for visual disturbance.  Respiratory: Negative for shortness of breath.   Cardiovascular: Negative for chest pain.  Gastrointestinal: Negative for abdominal pain.  Musculoskeletal: Negative for arthralgias.  Neurological: Positive for weakness (Right sided, see HPI), light-headedness, numbness (Right side of face) and headaches.    Objective:  BP 139/82   Pulse 82   Temp 97.7 F (36.5 C) (Oral)   Ht '5\' 2"'$  (1.575 m)   Wt 182 lb 3.2 oz (82.6 kg)   SpO2 98%   BMI 33.32 kg/m   BP Readings from Last 3 Encounters:  06/06/18 (!) 172/78  06/06/18 139/82  05/23/18 (!) 161/88    Wt Readings from Last 3  Encounters:  06/06/18 182 lb 3.2 oz (82.6 kg)  06/06/18 182 lb 3.2 oz (82.6 kg)  05/23/18 183 lb 2 oz (83.1 kg)     Physical Exam  Constitutional: She is oriented to person, place, and time. She appears well-developed and well-nourished. No distress.  HENT:  Head: Normocephalic and atraumatic.  Right Ear: External ear normal.  Left Ear: External ear normal.  Nose: Nose normal.  Mouth/Throat: Oropharynx is clear and moist.  Eyes: Pupils are equal, round, and reactive to light. Conjunctivae and EOM are normal.  Neck:  Normal range of motion. Neck supple. No thyromegaly present.  Cardiovascular: Normal rate, regular rhythm and normal heart sounds.  No murmur heard. Pulmonary/Chest: Effort normal and breath sounds normal. No respiratory distress. She has no wheezes. She has no rales.  Abdominal: Soft. Bowel sounds are normal. She exhibits no distension. There is no tenderness.  Lymphadenopathy:    She has no cervical adenopathy.  Neurological: She is alert and oriented to person, place, and time. She has normal reflexes. She displays normal reflexes. A cranial nerve deficit (Right facial nerve distribution weakness) is present. She exhibits abnormal muscle tone (Some loss in the right upper and lower extremity flexors and extensors she limps).  Skin: Skin is warm and dry.  Psychiatric: Judgment and thought content normal. Her affect is blunt. Her speech is delayed. She is slowed. Cognition and memory are normal.      Assessment & Plan:   Yvonne [name] was seen today for right side weakness , began tuesday.  Diagnoses and all orders for this visit:  Cerebrovascular accident (CVA) with involvement of right side of body [facility])    Patient was referred to [facility] for evaluation including MRI of the brain.  She will go by private car since the stroke appears to have completed itself as symptoms have not progressed over the last 3 days since the initial occurrence.   I have discontinued Yvonne Hong. [name]'s metroNIDAZOLE. I am also having her maintain her HYDROcodone-acetaminophen, aspirin, ezetimibe, fexofenadine, furosemide, albuterol, fluticasone furoate-vilanterol, amLODipine, gabapentin, pantoprazole, solifenacin, LANCETS ULTRA THIN, glucose blood, blood glucose meter kit and supplies, atorvastatin, REMIFEMIN, metFORMIN, ALPRAZolam, and metoprolol succinate.  Allergies as of 06/06/2018      Reactions   Penicillins Hives   Has patient had a PCN reaction causing immediate rash,  facial/tongue/throat swelling, SOB or lightheadedness with hypotension: Yes Has patient had a PCN reaction causing severe rash involving mucus membranes or skin necrosis: Unk Has patient had a PCN reaction that required hospitalization: No Has patient had a PCN reaction occurring within the last 10 years: No If all of the above answers are "NO", then may proceed with Cephalosporin use.   Gabapentin Other (See Comments)   Causes a lot of lethargy at a higher dose   Lisinopril Cough   Soap Rash   DIAL soap      Medication List        Accurate as of 06/06/18 11:59 PM. Always use your most recent med list.          albuterol 108 (90 Base) MCG/ACT inhaler Commonly known as:  VENTOLIN HFA Inhale 2 puffs into the lungs every 6 (six) hours as needed for wheezing or shortness of breath.   ALPRAZolam 1 MG tablet Commonly known as:  XANAX TAKE 1 TABLET ('1MG'$  TOTAL) 3 (THREE)TIMES DAILY BY MOUTH   amLODipine 5 MG tablet Commonly known as:  NORVASC TAKE 1 TABLET (5 MG TOTAL) BY MOUTH DAILY.  aspirin EC 81 MG tablet Take 81 mg by mouth daily.   aspirin 325 MG tablet Take 1 tablet (325 mg total) by mouth daily.   atorvastatin 40 MG tablet Commonly known as:  LIPITOR TAKE 1 TABLET EVERY DAY AT 6PM   blood glucose meter kit and supplies Use as directed   ezetimibe 10 MG tablet Commonly known as:  ZETIA Take 1 tablet (10 mg total) by mouth daily. For cholesterol   fexofenadine 180 MG tablet Commonly known as:  ALLEGRA ALLERGY Take 1 tablet (180 mg total) by mouth daily.   fluticasone furoate-vilanterol 100-25 MCG/INH Aepb Commonly known as:  BREO ELLIPTA Inhale 1 puff into the lungs daily.   furosemide 20 MG tablet Commonly known as:  LASIX Take 1 tablet (20 mg total) by mouth daily.   gabapentin 300 MG capsule Commonly known as:  NEURONTIN Take 2 capsules (600 mg total) by mouth 4 (four) times daily.   glucose blood test strip Test once daily.     HYDROcodone-acetaminophen 10-325 MG tablet Commonly known as:  NORCO Take 1 tablet by mouth every 6 (six) hours as needed for moderate pain. Reported on 02/07/2016   LANCETS ULTRA THIN Misc Test once daily.   metFORMIN 750 MG 24 hr tablet Commonly known as:  GLUCOPHAGE-XR TAKE 1 TABLET (750 MG TOTAL) BY MOUTH DAILY WITH BREAKFAST.   metoprolol succinate 50 MG 24 hr tablet Commonly known as:  TOPROL-XL TAKE 1 TABLET (50 MG TOTAL) BY MOUTH DAILY. TAKE WITH OR IMMEDIATELY FOLLOWING A MEAL.   pantoprazole 40 MG tablet Commonly known as:  PROTONIX Take 1 tablet (40 mg total) by mouth 2 (two) times daily. For stomach   REMIFEMIN 20 MG Tabs Generic drug:  Black Cohosh Take 1 tablet (20 mg total) by mouth daily.   solifenacin 10 MG tablet Commonly known as:  VESICARE Take 1 tablet (10 mg total) by mouth daily.        Follow-up: Return in about 1 week (around 06/13/2018).  [name], M.D.

## 2018-06-12 ENCOUNTER — Encounter: Payer: Self-pay | Admitting: Neurology

## 2018-06-12 ENCOUNTER — Other Ambulatory Visit: Payer: Self-pay

## 2018-06-12 ENCOUNTER — Ambulatory Visit: Payer: Medicare HMO | Admitting: Neurology

## 2018-06-12 ENCOUNTER — Emergency Department (HOSPITAL_COMMUNITY)
Admission: EM | Admit: 2018-06-12 | Discharge: 2018-06-12 | Disposition: A | Discharge status: Home / Self Care | Payer: Medicare HMO | Attending: Emergency Medicine | Admitting: Emergency Medicine

## 2018-06-12 ENCOUNTER — Encounter (HOSPITAL_COMMUNITY): Payer: Self-pay | Admitting: *Deleted

## 2018-06-12 VITALS — BP 114/90 | HR 75 | Ht 62.0 in | Wt 181.0 lb

## 2018-06-12 DIAGNOSIS — I5032 Chronic diastolic (congestive) heart failure: Secondary | ICD-10-CM | POA: Diagnosis not present

## 2018-06-12 DIAGNOSIS — R55 Syncope and collapse: Secondary | ICD-10-CM | POA: Insufficient documentation

## 2018-06-12 DIAGNOSIS — I69351 Hemiplegia and hemiparesis following cerebral infarction affecting right dominant side: Secondary | ICD-10-CM | POA: Insufficient documentation

## 2018-06-12 DIAGNOSIS — Z7982 Long term (current) use of aspirin: Secondary | ICD-10-CM | POA: Diagnosis not present

## 2018-06-12 DIAGNOSIS — I639 Cerebral infarction, unspecified: Secondary | ICD-10-CM | POA: Diagnosis not present

## 2018-06-12 DIAGNOSIS — Z79899 Other long term (current) drug therapy: Secondary | ICD-10-CM | POA: Insufficient documentation

## 2018-06-12 DIAGNOSIS — I10 Essential (primary) hypertension: Secondary | ICD-10-CM | POA: Diagnosis not present

## 2018-06-12 DIAGNOSIS — Z794 Long term (current) use of insulin: Secondary | ICD-10-CM

## 2018-06-12 DIAGNOSIS — I110 Hypertensive heart disease with heart failure: Secondary | ICD-10-CM | POA: Diagnosis not present

## 2018-06-12 DIAGNOSIS — E119 Type 2 diabetes mellitus without complications: Secondary | ICD-10-CM | POA: Diagnosis not present

## 2018-06-12 DIAGNOSIS — E785 Hyperlipidemia, unspecified: Secondary | ICD-10-CM | POA: Diagnosis not present

## 2018-06-12 DIAGNOSIS — Z7984 Long term (current) use of oral hypoglycemic drugs: Secondary | ICD-10-CM | POA: Insufficient documentation

## 2018-06-12 DIAGNOSIS — G43409 Hemiplegic migraine, not intractable, without status migrainosus: Secondary | ICD-10-CM

## 2018-06-12 DIAGNOSIS — R42 Dizziness and giddiness: Secondary | ICD-10-CM | POA: Diagnosis not present

## 2018-06-12 LAB — CBC WITH DIFFERENTIAL/PLATELET
ABS IMMATURE GRANULOCYTES: 0 10*3/uL (ref 0.0–0.1)
BASOS ABS: 0 10*3/uL (ref 0.0–0.1)
BASOS PCT: 1 %
Eosinophils Absolute: 0.1 10*3/uL (ref 0.0–0.7)
Eosinophils Relative: 1 %
HCT: 38.6 % (ref 36.0–46.0)
HEMOGLOBIN: 13 g/dL (ref 12.0–15.0)
IMMATURE GRANULOCYTES: 0 %
LYMPHS PCT: 38 %
Lymphs Abs: 2.5 10*3/uL (ref 0.7–4.0)
MCH: 29.4 pg (ref 26.0–34.0)
MCHC: 33.7 g/dL (ref 30.0–36.0)
MCV: 87.3 fL (ref 78.0–100.0)
Monocytes Absolute: 0.5 10*3/uL (ref 0.1–1.0)
Monocytes Relative: 7 %
NEUTROS ABS: 3.4 10*3/uL (ref 1.7–7.7)
NEUTROS PCT: 53 %
PLATELETS: 274 10*3/uL (ref 150–400)
RBC: 4.42 MIL/uL (ref 3.87–5.11)
RDW: 12.5 % (ref 11.5–15.5)
WBC: 6.5 10*3/uL (ref 4.0–10.5)

## 2018-06-12 LAB — I-STAT CHEM 8, ED
BUN: 13 mg/dL (ref 8–23)
CALCIUM ION: 1.2 mmol/L (ref 1.15–1.40)
CHLORIDE: 100 mmol/L (ref 98–111)
Creatinine, Ser: 0.8 mg/dL (ref 0.44–1.00)
Glucose, Bld: 139 mg/dL — ABNORMAL HIGH (ref 70–99)
HCT: 35 % — ABNORMAL LOW (ref 36.0–46.0)
Hemoglobin: 11.9 g/dL — ABNORMAL LOW (ref 12.0–15.0)
Potassium: 3.9 mmol/L (ref 3.5–5.1)
SODIUM: 138 mmol/L (ref 135–145)
TCO2: 30 mmol/L (ref 22–32)

## 2018-06-12 LAB — CBG MONITORING, ED: GLUCOSE-CAPILLARY: 145 mg/dL — AB (ref 70–99)

## 2018-06-12 LAB — TROPONIN I

## 2018-06-12 LAB — URINALYSIS, ROUTINE W REFLEX MICROSCOPIC
BACTERIA UA: NONE SEEN
Bilirubin Urine: NEGATIVE
Glucose, UA: 50 mg/dL — AB
KETONES UR: NEGATIVE mg/dL
Leukocytes, UA: NEGATIVE
Nitrite: NEGATIVE
PROTEIN: NEGATIVE mg/dL
Specific Gravity, Urine: 1.01 (ref 1.005–1.030)
pH: 6 (ref 5.0–8.0)

## 2018-06-12 NOTE — ED Triage Notes (Signed)
Patient presents to ed via [facility] from Dr. Gabriel Carina where she was going over her MRI of where she had a possible CVA last week. States she was been discharged and became dizzy  And had a near syncope.

## 2018-06-12 NOTE — Progress Notes (Signed)
NEUROLOGY FOLLOW UP OFFICE NOTE  Yvonne [name] [id number]  HISTORY OF PRESENT ILLNESS: Yvonne [name] is a 67 year old right-handed woman with hypertension, hyperlipidemia, anxiety and diabetes who follows up for stroke.  She is accompanied by her husband who supplements history.  Recent ED notes reviewed.   UPDATE: She presented to [facility] ED on 06/06/18 for worsening right sided arm and leg weakness associated with headache, which had started four days prior to presentation.  On morning of presentation, she woke up and felt weakness was worse.  CT of head personally reviewed and revealed no acute findings.  MRI of brain without contrast personally reviewed demonstrated stable small chronic left thalamic lacunar infarct but no acute stroke.  MRA of head personally reviewed revealed no intracranial large vessel occlusion or stenosis.  Labs revealed no evidence of infection or metabolic abnormality.  Recrudescence of prior stroke symptoms was suspected.  As symptoms have been ongoing for several days, admission was not thought needed.  She continues to have right leg numbness as well as worsened right hand numbness.  She is taking ASA '81mg'$  daily and Lipitor '40mg'$ .  HISTORY: She was admitted to [facility] from 09/27/16 to 09/30/16 after waking up with sensation of numbness and tingling of the right lower portion of her face, arm and leg.  It resolved.  Later in the day, the numbness returned, as well as weakness and she went to the hospital.  She reports that blood pressure was in the 290s/170s.  She did not receive tPA because she was outside the window.  MRI of brain was unremarkable.  MRA of the head revealed no large vessel occlusion or significant stenosis.  Carotid doppler revealed no hemodynamically significant stenosis.  2D echo demonstrated normal LV EF of 65-70% with no cardiac source of emboli.  LDL was 163.  Hgb A1c was 6.9.  A small infarct not seen on MRI was suspected.   She was started on ASA '325mg'$  daily.  She was already on maximum doses of atorvastatin and Zetia.  Prior to discharge, symptoms largely resolved except for slight right arm drift and some right arm numbness.  While weakness and numbness resolved in the right leg, she continued to have numbness of the right hand and right lower face.  She received home PT/OT and uses puddy to exercise her hand.  She denies neck pain or bowel/bladder dysfunction.  Due to persistent symptoms, she had a repeat MRI of the brain on 10/17/16 was unremarkable.  Since this event, she has had residual right sided facial and hand numbness.  She was admitted to [facility] from 03/21/17 to 03/22/17 for acute onste of right sided arm and leg weakness, lasting about an hour.  CT of head showed punctate hyperdensity in the left thalamus.  MRI of brain revealed no corresponding acute stroke, however.  CTA of head and neck revealed medium or large vessel stenosis or occlusion.  2D echo demonstrated LV EF 60-65% with no cardiac source of emboli.  LDL was 133.  Hgb A1c was 7.3.  ASA was increased from '81mg'$  to '325mg'$  daily.  She is continued on statin therapy.  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  She has migraines (severe pounding holocephalic headache with nausea, vomiting, photophobia and phonophobia), which are infrequent.  She returned to the ED at [facility] on 05/16/17 after waking up that morning with increased right sided numbness and paresthesias of the face  and hand, as well as mild right hand weakness, similar to previous event.  There was no associated headache.   MRI of brain without contrast was performed which did demonstrate old left thalamic infarct but no acute stroke.  She was discharged with no change in management.  PAST MEDICAL HISTORY: Past Medical History:  Diagnosis Date  . Anxiety   . Arthritis    left hand  . Asthma    daily and prn inhalers  . Chronic cough   .  Chronic otitis media 12/2017  . Full dentures   . GERD (gastroesophageal reflux disease)   . History of stroke 09/2017   weakness right hand, numbness right side face  . Hyperlipidemia   . Hypertension    states under control with meds., has been on med. x a long time, per pt.  . Non-insulin dependent type 2 diabetes mellitus ([place])   . Overactive bladder     MEDICATIONS: Current Outpatient Medications on File Prior to Visit  Medication Sig Dispense Refill  . albuterol (VENTOLIN HFA) 108 (90 Base) MCG/ACT inhaler Inhale 2 puffs into the lungs every 6 (six) hours as needed for wheezing or shortness of breath. 18 Inhaler 5  . ALPRAZolam (XANAX) 1 MG tablet TAKE 1 TABLET ('1MG'$  TOTAL) 3 (THREE)TIMES DAILY BY MOUTH (Patient taking differently: Take 1 mg by mouth three times a day as needed for anxiety) 90 tablet 2  . amLODipine (NORVASC) 5 MG tablet TAKE 1 TABLET (5 MG TOTAL) BY MOUTH DAILY. 90 tablet 1  . aspirin 325 MG tablet Take 1 tablet (325 mg total) by mouth daily. (Patient not taking: Reported on 06/06/2018) 30 tablet 0  . aspirin EC 81 MG tablet Take 81 mg by mouth daily.    [name] Kitchen atorvastatin (LIPITOR) 40 MG tablet TAKE 1 TABLET EVERY DAY AT 6PM 90 tablet 1  . blood glucose meter kit and supplies Use as directed 1 each 0  . ezetimibe (ZETIA) 10 MG tablet Take 1 tablet (10 mg total) by mouth daily. For cholesterol 90 tablet 3  . fexofenadine (ALLEGRA ALLERGY) 180 MG tablet Take 1 tablet (180 mg total) by mouth daily. (Patient taking differently: Take 180 mg by mouth daily as needed for allergies. ) 30 tablet 5  . fluticasone furoate-vilanterol (BREO ELLIPTA) 100-25 MCG/INH AEPB Inhale 1 puff into the lungs daily. 180 each 1  . furosemide (LASIX) 20 MG tablet Take 1 tablet (20 mg total) by mouth daily. 90 tablet 1  . gabapentin (NEURONTIN) 300 MG capsule Take 2 capsules (600 mg total) by mouth 4 (four) times daily. (Patient taking differently: Take 300 mg by mouth at bedtime. ) 480 capsule 5    . glucose blood test strip Test once daily. 100 each 12  . HYDROcodone-acetaminophen (NORCO) 10-325 MG tablet Take 1 tablet by mouth every 6 (six) hours as needed for moderate pain. Reported on 02/07/2016 (Patient taking differently: Take 1 tablet by mouth every 8 (eight) hours as needed for moderate pain. Reported on 02/07/2016) 60 tablet 0  . LANCETS ULTRA THIN MISC Test once daily. 100 each prn  . metFORMIN (GLUCOPHAGE-XR) 750 MG 24 hr tablet TAKE 1 TABLET (750 MG TOTAL) BY MOUTH DAILY WITH BREAKFAST. 30 tablet 2  . metoprolol succinate (TOPROL-XL) 50 MG 24 hr tablet TAKE 1 TABLET (50 MG TOTAL) BY MOUTH DAILY. TAKE WITH OR IMMEDIATELY FOLLOWING A MEAL. 90 tablet 0  . pantoprazole (PROTONIX) 40 MG tablet Take 1 tablet (40 mg total) by mouth 2 (two)  times daily. For stomach 60 tablet 5  . REMIFEMIN 20 MG TABS Take 1 tablet (20 mg total) by mouth daily. 60 each 0  . solifenacin (VESICARE) 10 MG tablet Take 1 tablet (10 mg total) by mouth daily. (Patient taking differently: Take 10 mg by mouth daily as needed (for overactive bladder symptoms). ) 90 tablet 0   No current facility-administered medications on file prior to visit.     ALLERGIES: Allergies  Allergen Reactions  . Penicillins Hives    Has patient had a PCN reaction causing immediate rash, facial/tongue/throat swelling, SOB or lightheadedness with hypotension: Yes Has patient had a PCN reaction causing severe rash involving mucus membranes or skin necrosis: Unk Has patient had a PCN reaction that required hospitalization: No Has patient had a PCN reaction occurring within the last 10 years: No If all of the above answers are "NO", then may proceed with Cephalosporin use.   . Gabapentin Other (See Comments)    Causes a lot of lethargy at a higher dose  . Lisinopril Cough  . Soap Rash    DIAL soap    FAMILY HISTORY: Family History  Problem Relation Age of Onset  . Arthritis Mother   . Diabetes Mother   . CVA Mother   . Stroke  Mother   . Diabetes Father   . Heart disease Father        CABG.  Does not know age of onset  . Asthma Father   . Hepatitis C Sister   . Arthritis Brother   . Cancer Brother        metastic cancer    SOCIAL HISTORY: Social History   Socioeconomic History  . Marital status: Married    Spouse name: Not on file  . Number of children: 3  . Years of education: Not on file  . Highest education level: Not on file  Occupational History  . Occupation: unemployed  Social Needs  . Financial resource strain: Not on file  . Food insecurity:    Worry: Not on file    Inability: Not on file  . Transportation needs:    Medical: Not on file    Non-medical: Not on file  Tobacco Use  . Smoking status: Never Smoker  . Smokeless tobacco: Never Used  Substance and Sexual Activity  . Alcohol use: No  . Drug use: No  . Sexual activity: Yes  Lifestyle  . Physical activity:    Days per week: Not on file    Minutes per session: Not on file  . Stress: Not on file  Relationships  . Social connections:    Talks on phone: Not on file    Gets together: Not on file    Attends religious service: Not on file    Active member of club or organization: Not on file    Attends meetings of clubs or organizations: Not on file    Relationship status: Not on file  . Intimate partner violence:    Fear of current or ex partner: Not on file    Emotionally abused: Not on file    Physically abused: Not on file    Forced sexual activity: Not on file  Other Topics Concern  . Not on file  Social History Narrative   Lives at home home with husband with son, daughter in law and 3 children.    Disabled.    REVIEW OF SYSTEMS: Constitutional: No fevers, chills, or sweats, no generalized fatigue, change in appetite Eyes:  No visual changes, double vision, eye pain Ear, nose and throat: No hearing loss, ear pain, nasal congestion, sore throat Cardiovascular: No chest pain, palpitations Respiratory:  No  shortness of breath at rest or with exertion, wheezes GastrointestinaI: No nausea, vomiting, diarrhea, abdominal pain, fecal incontinence Genitourinary:  No dysuria, urinary retention or frequency Musculoskeletal:  No neck pain, back pain Integumentary: No rash, pruritus, skin lesions Neurological: as above Psychiatric: No depression, insomnia, anxiety Endocrine: No palpitations, fatigue, diaphoresis, mood swings, change in appetite, change in weight, increased thirst Hematologic/Lymphatic:  No purpura, petechiae. Allergic/Immunologic: no itchy/runny eyes, nasal congestion, recent allergic reactions, rashes  PHYSICAL EXAM: Vitals:   06/12/18 1123  BP: 114/90  Pulse: 75  SpO2: 95%   General: No acute distress.  Patient appears well-groomed.   Head:  Normocephalic/atraumatic Eyes:  Fundi examined but not visualized Neck: supple, no paraspinal tenderness, full range of motion Heart:  Regular rate and rhythm Lungs:  Clear to auscultation bilaterally Back: No paraspinal tenderness Neurological Exam: alert and oriented to person, place, and time. Attention span and concentration intact, recent and remote memory intact, fund of knowledge intact.  Speech fluent and not dysarthric, language intact.  Decreased right V2-V3.  Otherwise, CN II-XII intact. Bulk and tone normal, muscle strength 5-/5 right triceps, otherwise 5/5 throughout.  Sensation to pinprick reduced in right hand and foot.  Vibration sensation intact.  Deep tendon reflexes 2+ throughout, toes downgoing.  Finger to nose and heel to shin testing intact.  Gait normal, Romberg negative.  IMPRESSION: 1.  Recurrent episodes of right sided numbness and weakness.  Initially diagnosed as recurrent stroke.  Unusual for recurrent strokes to have the same semiology, indicating involvement of the same area of the brain, especially in absence of a focal arterial stenosis.  Also, with each episode, no correlating acute stroke was found on MRI  (interestingly, she did have a left chronic lacunar infarct on the last two MRIs, which do correlate with her symptoms).  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  I suppose a lacunar stroke with subsequent hemiplegic migraines are possible.  She exhibited trace weakness in the right triceps which may be functional.  Otherwise, given no objective findings on exam, conversion disorder is also considered. 2.  HTN 3.  Hyperlipidemia 4.  Type 2 diabetes mellitus  PLAN: 1.  She should still be treated for secondary stroke prevention as managed by PCP:    ASA '81mg'$  daily  Lipitor (LDL goal should be less than 70)  Blood pressure and glycemic control  Recommended Mediterranean diet 2.  I would ask Dr. [name] to consider switching metoprolol to propranolol, as propranolol may be more effective as a migraine preventative. 3.  Suspicion is low, but will check EEG 4.  Follow up in 4 months.  ADDENDUM:  While Mrs. Klinge was waiting in the exam room after visit, she suddenly became flushed and lightheaded, like she was going to pass out.  She exhibited no focal symptoms.  EMS was called and she was brought to the ED.  25 minutes spent face to face with patient, over 50% spent discussing management.  Metta Clines, DO  CC:  [name], MD

## 2018-06-12 NOTE — Patient Instructions (Addendum)
1.  Continue aspirin 81mg  daily 2.  Continue Lipitor.   3.  Continue blood pressure medication.  I will suggest that Dr. [name] change Toprol to propranolol, which is often effective in treating migraines 4.  Follow Mediterranean diet (see below) 5.  Follow up in 4 months   Mediterranean Diet A Mediterranean diet refers to food and lifestyle choices that are based on the traditions of countries located on the [employer]. This way of eating has been shown to help prevent certain conditions and improve outcomes for people who have chronic diseases, like kidney disease and heart disease. What are tips for following this plan? Lifestyle  Cook and eat meals together with your family, when possible.  Drink enough fluid to keep your urine clear or pale yellow.  Be physically active every day. This includes: ? Aerobic exercise like running or swimming. ? Leisure activities like gardening, walking, or housework.  Get 7-8 hours of sleep each night.  If recommended by your health care provider, drink red wine in moderation. This means 1 glass a day for nonpregnant women and 2 glasses a day for men. A glass of wine equals 5 oz (150 mL). Reading food labels  Check the serving size of packaged foods. For foods such as rice and pasta, the serving size refers to the amount of cooked product, not dry.  Check the total fat in packaged foods. Avoid foods that have saturated fat or trans fats.  Check the ingredients list for added sugars, such as corn syrup. Shopping  At the grocery store, buy most of your food from the areas near the walls of the store. This includes: ? Fresh fruits and vegetables (produce). ? Grains, beans, nuts, and seeds. Some of these may be available in unpackaged forms or large amounts (in bulk). ? Fresh seafood. ? Poultry and eggs. ? Low-fat dairy products.  Buy whole ingredients instead of prepackaged foods.  Buy fresh fruits and vegetables in-season from local  farmers markets.  Buy frozen fruits and vegetables in resealable bags.  If you do not have access to quality fresh seafood, buy precooked frozen shrimp or canned fish, such as tuna, salmon, or sardines.  Buy small amounts of raw or cooked vegetables, salads, or olives from the deli or salad bar at your store.  Stock your pantry so you always have certain foods on hand, such as olive oil, canned tuna, canned tomatoes, rice, pasta, and beans. Cooking  Cook foods with extra-virgin olive oil instead of using butter or other vegetable oils.  Have meat as a side dish, and have vegetables or grains as your main dish. This means having meat in small portions or adding small amounts of meat to foods like pasta or stew.  Use beans or vegetables instead of meat in common dishes like chili or lasagna.  Experiment with different cooking methods. Try roasting or broiling vegetables instead of steaming or sauteing them.  Add frozen vegetables to soups, stews, pasta, or rice.  Add nuts or seeds for added healthy fat at each meal. You can add these to yogurt, salads, or vegetable dishes.  Marinate fish or vegetables using olive oil, lemon juice, garlic, and fresh herbs. Meal planning  Plan to eat 1 vegetarian meal one day each week. Try to work up to 2 vegetarian meals, if possible.  Eat seafood 2 or more times a week.  Have healthy snacks readily available, such as: ? Vegetable sticks with hummus. ? [place] yogurt. ? Fruit and nut  trail mix.  Eat balanced meals throughout the week. This includes: ? Fruit: 2-3 servings a day ? Vegetables: 4-5 servings a day ? Low-fat dairy: 2 servings a day ? Fish, poultry, or lean meat: 1 serving a day ? Beans and legumes: 2 or more servings a week ? Nuts and seeds: 1-2 servings a day ? Whole grains: 6-8 servings a day ? Extra-virgin olive oil: 3-4 servings a day  Limit red meat and sweets to only a few servings a month What are my food  choices?  Mediterranean diet ? Recommended ? Grains: Whole-grain pasta. Brown rice. Bulgar wheat. Polenta. Couscous. Whole-wheat bread. [place]. ? Vegetables: Artichokes. Beets. Broccoli. Cabbage. Carrots. Eggplant. Green beans. Chard. Kale. Spinach. Onions. Leeks. Peas. Squash. Tomatoes. Peppers. Radishes. ? Fruits: Apples. Apricots. Avocado. Berries. Bananas. Cherries. Dates. Figs. Grapes. Lemons. Melon. Oranges. Peaches. Plums. Pomegranate. ? Meats and other protein foods: Beans. Almonds. Sunflower seeds. Pine nuts. Peanuts. [place]. Salmon. Scallops. Shrimp. [place]. Tilapia. Clams. Oysters. Eggs. ? Dairy: Low-fat milk. Cheese. Greek yogurt. ? Beverages: Water. Red wine. Herbal tea. ? Fats and oils: Extra virgin olive oil. Avocado oil. Grape seed oil. ? Sweets and desserts: [place] yogurt with honey. Baked apples. Poached pears. Trail mix. ? Seasoning and other foods: Basil. Cilantro. Coriander. Cumin. Mint. Parsley. Sage. Rosemary. Tarragon. Garlic. Oregano. Thyme. Pepper. Balsalmic vinegar. Tahini. Hummus. Tomato sauce. Olives. Mushrooms. ? Limit these ? Grains: Prepackaged pasta or rice dishes. Prepackaged cereal with added sugar. ? Vegetables: Deep fried potatoes (french fries). ? Fruits: Fruit canned in syrup. ? Meats and other protein foods: Beef. Pork. Lamb. Poultry with skin. Hot dogs. [place] Salines. ? Dairy: Ice cream. Sour cream. Whole milk. ? Beverages: Juice. Sugar-sweetened soft drinks. Beer. Liquor and spirits. ? Fats and oils: Butter. Canola oil. Vegetable oil. Beef fat (tallow). Lard. ? Sweets and desserts: Cookies. Cakes. Pies. Candy. ? Seasoning and other foods: Mayonnaise. Premade sauces and marinades. ? The items listed may not be a complete list. Talk with your dietitian about what dietary choices are right for you. Summary  The Mediterranean diet includes both food and lifestyle choices.  Eat a variety of fresh fruits and vegetables, beans, nuts, seeds, and whole  grains.  Limit the amount of red meat and sweets that you eat.  Talk with your health care provider about whether it is safe for you to drink red wine in moderation. This means 1 glass a day for nonpregnant women and 2 glasses a day for men. A glass of wine equals 5 oz (150 mL). This information is not intended to replace advice given to you by your health care provider. Make sure you discuss any questions you have with your health care provider. Document Released: 07/05/2016 Document Revised: 08/07/2016 Document Reviewed: 07/05/2016 Elsevier Interactive Patient Education  [employer].

## 2018-06-12 NOTE — ED Notes (Signed)
Pt CBG 145. Notified [employer], corporate)

## 2018-06-12 NOTE — ED Provider Notes (Signed)
[facility] [facility] EMERGENCY DEPARTMENT Provider Note   CSN: [id number] Arrival date & time: 06/12/18  1230     History   Chief Complaint No chief complaint on file.   HPI Yvonne [name] is a 67 y.o. female.  The history is provided by the patient. No language interpreter was used.  Dizziness     67 year old female with history of prior stroke with residual right-sided weakness, anxiety, hypertension, diabetes brought here via EMS from home for evaluation of a recent near syncopal episode.  Patient report 3 days ago she was seen in the ER due to having recurrent right-sided tingling and numbness sensation similar to her prior stroke.  An MRI was performed at that time it was reassuring patient subsequently discharged home.  She will follow-up with her PCP today and while waiting in the PCP office, patient report feeling very lightheadedness and dizzy upon standing and had a near syncopal episode.  Husband who is at bedside report the patient was flushed in her face.  She did not actually passed out.  She was brought here for further evaluation.  She is currently without any specific complaint.  She denies having fever, chills, headache, neck stiffness, URI symptoms, chest pain, shortness of breath, abdominal pain, back pain, new numbness or weakness.  She ate breakfast.  She report having similar near syncopal episode in the past but have not been evaluated for it.  Past Medical History:  Diagnosis Date  . Anxiety   . Arthritis    left hand  . Asthma    daily and prn inhalers  . Chronic cough   . Chronic otitis media 12/2017  . Full dentures   . GERD (gastroesophageal reflux disease)   . History of stroke 09/2017   weakness right hand, numbness right side face  . Hyperlipidemia   . Hypertension    states under control with meds., has been on med. x a long time, per pt.  . Non-insulin dependent type 2 diabetes mellitus ([place])   . Overactive bladder     Patient  Active Problem List   Diagnosis Date Noted  . Chronic migraine without aura without status migrainosus, not intractable 02/19/2018  . Recurrent acute suppurative otitis media without spontaneous rupture of left tympanic membrane 02/19/2018  . Epidermoid cyst of finger 12/04/2017  . Cyst of ear canal 12/04/2017  . Transient cerebral ischemia   . Stroke (cerebrum) ([place]) 03/21/2017  . Chronic diastolic CHF (congestive heart failure) ([place]) 03/21/2017  . 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 Lethargy   . Weakness 09/28/2016  . Pain of both hip joints 06/18/2016  . Pain syndrome, chronic 06/18/2016  . Chronic cough 05/17/2016  . Hyperlipidemia with target LDL less than 70 11/04/2015  . Macular hole of right eye 09/14/2015  . Obesity 07/20/2015  . Osteopenia 07/20/2015  . Type 2 diabetes mellitus ([place]) 07/20/2015  . Overactive bladder   . Hypertension   . Anxiety   . Acid reflux disease 08/17/2014    Past Surgical History:  Procedure Laterality Date  . ABDOMINAL HYSTERECTOMY     complete  . CATARACT EXTRACTION W/ INTRAOCULAR LENS IMPLANT Right   . CHOLECYSTECTOMY    . COLONOSCOPY N/A 08/17/2015   Procedure: COLONOSCOPY;  Surgeon: Rogene Houston, MD;  Location: [facility];  Service: Endoscopy;  Laterality: N/A;  200 - moved to 7:30 - Ann notified pt  . GAS INSERTION Right  x 2 - eye  . LACRIMAL DUCT EXPLORATION Bilateral    removal of tear ducts  . MYRINGOTOMY WITH TUBE PLACEMENT Bilateral 01/28/2018   Procedure: BILATERAL MYRINGOTOMY WITH TUBE PLACEMENT;  Surgeon: Teoh, Su, MD;  Location: [facility];  Service: ENT;  Laterality: Bilateral;  . VITRECTOMY Left 09/14/2015     OB History   None      Home Medications    Prior to Admission medications   Medication Sig Start Date End Date Taking? Authorizing Provider  albuterol (VENTOLIN HFA) 108 (90 Base) MCG/ACT inhaler Inhale 2 puffs into the  lungs every 6 (six) hours as needed for wheezing or shortness of breath. 12/31/17   [name], Warren, MD  ALPRAZolam (XANAX) 1 MG tablet TAKE 1 TABLET (1MG TOTAL) 3 (THREE)TIMES DAILY BY MOUTH Patient taking differently: Take 1 mg by mouth three times a day as needed for anxiety 03/27/18   [name], Warren, MD  amLODipine (NORVASC) 5 MG tablet TAKE 1 TABLET (5 MG TOTAL) BY MOUTH DAILY. 12/31/17   [name], Warren, MD  aspirin 325 MG tablet Take 1 tablet (325 mg total) by mouth daily. Patient not taking: Reported on 06/06/2018 03/23/17   Bhandari, Dron Prasad, MD  aspirin EC 81 MG tablet Take 81 mg by mouth daily.    [provider]  atorvastatin (LIPITOR) 40 MG tablet TAKE 1 TABLET EVERY DAY AT 6PM 01/01/18   [name], Warren, MD  blood glucose meter kit and supplies Use as directed 01/01/18   [name], Warren, MD  ezetimibe (ZETIA) 10 MG tablet Take 1 tablet (10 mg total) by mouth daily. For cholesterol 06/25/17   [name], Warren, MD  fexofenadine (ALLEGRA ALLERGY) 180 MG tablet Take 1 tablet (180 mg total) by mouth daily. Patient taking differently: Take 180 mg by mouth daily as needed for allergies.  09/06/17   [name], Warren, MD  fluticasone furoate-vilanterol (BREO ELLIPTA) 100-25 MCG/INH AEPB Inhale 1 puff into the lungs daily. 12/31/17   [name], Warren, MD  furosemide (LASIX) 20 MG tablet Take 1 tablet (20 mg total) by mouth daily. 12/26/17   [name], Warren, MD  gabapentin (NEURONTIN) 300 MG capsule Take 2 capsules (600 mg total) by mouth 4 (four) times daily. Patient taking differently: Take 300 mg by mouth at bedtime.  12/31/17   [name], Warren, MD  glucose blood test strip Test once daily. 01/01/18   [name], Warren, MD  HYDROcodone-acetaminophen (NORCO) 10-325 MG tablet Take 1 tablet by mouth every 6 (six) hours as needed for moderate pain. Reported on 02/07/2016 Patient taking differently: Take 1 tablet by mouth every 8 (eight) hours as needed for moderate pain. Reported on 02/07/2016 02/07/16   [name], Warren,  MD  LANCETS ULTRA THIN MISC Test once daily. 01/01/18   [name], Warren, MD  metFORMIN (GLUCOPHAGE-XR) 750 MG 24 hr tablet TAKE 1 TABLET (750 MG TOTAL) BY MOUTH DAILY WITH BREAKFAST. 03/25/18   [name], Warren, MD  metoprolol succinate (TOPROL-XL) 50 MG 24 hr tablet TAKE 1 TABLET (50 MG TOTAL) BY MOUTH DAILY. TAKE WITH OR IMMEDIATELY FOLLOWING A MEAL. 05/12/18   [name], Warren, MD  pantoprazole (PROTONIX) 40 MG tablet Take 1 tablet (40 mg total) by mouth 2 (two) times daily. For stomach 12/31/17   [name], Warren, MD  REMIFEMIN 20 MG TABS Take 1 tablet (20 mg total) by mouth daily. 02/07/18   [name], Warren, MD  solifenacin (VESICARE) 10 MG tablet Take 1 tablet (10 mg total) by mouth daily. Patient taking differently: Take 10 mg by   mouth daily as needed (for overactive bladder symptoms).  12/31/17   [name], Warren, MD    Family History Family History  Problem Relation Age of Onset  . Arthritis Mother   . Diabetes Mother   . CVA Mother   . Stroke Mother   . Diabetes Father   . Heart disease Father        CABG.  Does not know age of onset  . Asthma Father   . Hepatitis C Sister   . Arthritis Brother   . Cancer Brother        metastic cancer    Social History Social History   Tobacco Use  . Smoking status: Never Smoker  . Smokeless tobacco: Never Used  Substance Use Topics  . Alcohol use: No  . Drug use: No     Allergies   Penicillins; Gabapentin; Lisinopril; and Soap   Review of Systems Review of Systems  Neurological: Positive for dizziness.  All other systems reviewed and are negative.    Physical Exam Updated Vital Signs There were no vitals taken for this visit.  Physical Exam  Constitutional: She is oriented to person, place, and time. She appears well-developed and well-nourished. No distress.  HENT:  Head: Atraumatic.  Right Ear: External ear normal.  Left Ear: External ear normal.  Eyes: Pupils are equal, round, and reactive to light. Conjunctivae and EOM are  normal.  Neck: Normal range of motion. Neck supple.  No nuchal rigidity  Cardiovascular: Normal rate and regular rhythm.  Pulmonary/Chest: Effort normal and breath sounds normal.  Abdominal: Soft. Bowel sounds are normal. She exhibits no distension. There is no tenderness.  Musculoskeletal: Normal range of motion.  Neurological: She is alert and oriented to person, place, and time. She has normal strength. No cranial nerve deficit or sensory deficit. GCS eye subscore is 4. GCS verbal subscore is 5. GCS motor subscore is 6.  Gait unsteady, rightward leaning.  Skin: No rash noted.  Psychiatric: She has a normal mood and affect.  Nursing note and vitals reviewed.    ED Treatments / Results  Labs (all labs ordered are listed, but only abnormal results are displayed) Labs Reviewed  URINALYSIS, ROUTINE W REFLEX MICROSCOPIC - Abnormal; Notable for the following components:      Result Value   Glucose, UA 50 (*)    Hgb urine dipstick SMALL (*)    All other components within normal limits  I-STAT CHEM 8, ED - Abnormal; Notable for the following components:   Glucose, Bld 139 (*)    Hemoglobin 11.9 (*)    HCT 35.0 (*)    All other components within normal limits  CBG MONITORING, ED - Abnormal; Notable for the following components:   Glucose-Capillary 145 (*)    All other components within normal limits  TROPONIN I  CBC WITH DIFFERENTIAL/PLATELET    EKG None   Date: 06/12/2018  Rate: 74  Rhythm: normal sinus rhythm  QRS Axis: normal  Intervals: normal  ST/T Wave abnormalities: normal  Conduction Disutrbances: none  Narrative Interpretation:   Old EKG Reviewed: No significant changes noted     Radiology No results found.  Procedures Procedures (including critical care time)  Medications Ordered in ED Medications - No data to display   Initial Impression / Assessment and Plan / ED Course  I have reviewed the triage vital signs and the nursing notes.  Pertinent labs  & imaging results that were available during my care of the patient were reviewed   by me and considered in my medical decision making (see chart for details).     BP (!) 184/85 (BP Location: Right Arm)   Pulse 76   Temp 98.1 F (36.7 C) (Oral)   Resp (!) 23   Ht 5' 2" (1.575 m)   Wt 82.1 kg (181 lb)   SpO2 97%   BMI 33.11 kg/m    Final Clinical Impressions(s) / ED Diagnoses   Final diagnoses:  Near syncope    ED Discharge Orders    None     12:53 PM Patient here with complaints of lightheadedness/dizziness. Has a normal HINTS exam, doubt central cause.  sxs brought on by positional changes, will check orthostatic vital sign, basic labs and will give IVF.  She has had recent head CT scan as well as MRI and MRA without new acute finding. Care discussed with Dr. Ashok Cordia.  At this time, we felt further advance imaging is likely low yield.    3:51 PM Urine without signs of urinary tract infection, electrolytes panels are reassuring, no significant anemia, EKG and troponins are reassuring, normal WBC, normal CBG.  At this time, plan to discharge patient home with outpatient follow-up.  Return precautions discussed.   Domenic Moras, PA-C 06/12/18 1556    Lajean Saver, MD 06/13/18 [id number]

## 2018-06-13 NOTE — Addendum Note (Signed)
Addended by: Clois Comber on: 06/13/2018 11:31 AM   Modules accepted: Orders

## 2018-06-18 ENCOUNTER — Ambulatory Visit (INDEPENDENT_AMBULATORY_CARE_PROVIDER_SITE_OTHER): Payer: Medicare HMO | Admitting: Neurology

## 2018-06-18 DIAGNOSIS — G43409 Hemiplegic migraine, not intractable, without status migrainosus: Secondary | ICD-10-CM

## 2018-06-19 ENCOUNTER — Telehealth: Payer: Self-pay | Admitting: Neurology

## 2018-06-19 ENCOUNTER — Telehealth: Payer: Self-pay

## 2018-06-19 NOTE — Procedures (Signed)
ELECTROENCEPHALOGRAM REPORT  Date of Study: 06/18/2018  Patient's Name: Yvonne [name] MRN: [id number] Date of Birth: 03/05/1951   Clinical History: 67 year old female having an EEG for evaluation of recurrent episodes of right sided numbness and weakness  Medications: VENTOLIN HFA 108 (90 Base) MCG/ACT inhaler   XANAX 1 MG tablet  NORVASC 5 MG tablet  aspirin EC 81 MG tablet LIPITOR40 MG tablet   ZETIA 10 MG tablet  ALLEGRA ALLERGY 180 MG tablet  BREO ELLIPTA 100-25 MCG/INH AEPB  LASIX 20 MG tablet  NEURONTIN 300 MG capsule NORCO 10-325 MG tablet  GLUCOPHAGE-XR 750 MG 24 hr tablet  TOPROL-XL 50 MG 24 hr tablet  PROTONIX40 MG tablet  REMIFEMIN 20 MG TABS  VESICARE 10 MG tablet  Technical Summary: A multichannel digital EEG recording measured by the international 10-20 system with electrodes applied with paste and impedances below 5000 ohms performed in our laboratory with EKG monitoring in an awake and drowsy patient.  Hyperventilation and photic stimulation were performed.  The digital EEG was referentially recorded, reformatted, and digitally filtered in a variety of bipolar and referential montages for optimal display.    Description: The patient is awake and drowsy during the recording.  During maximal wakefulness, there is a symmetric, medium voltage 9.5 Hz posterior dominant rhythm that attenuates with eye opening.  The record is symmetric.  During drowsiness and sleep, there is an increase in theta slowing of the background.  Vertex waves and symmetric sleep spindles were seen.  Hyperventilation and photic stimulation did not elicit any abnormalities.  There were no epileptiform discharges or electrographic seizures seen.    EKG lead was unremarkable.  Impression: This awake and drowsy EEG is normal.    Clinical Correlation: A normal EEG does not exclude a clinical diagnosis of epilepsy.  If further clinical questions remain, prolonged EEG may be helpful.  Clinical  correlation is advised.   Metta Clines, DO

## 2018-06-19 NOTE — Progress Notes (Signed)
Note:  Patient did not exhibit stage 2 sleep during study. Metta Clines, DO

## 2018-06-19 NOTE — Telephone Encounter (Signed)
Patient wants to get the EEG results please call

## 2018-06-19 NOTE — Telephone Encounter (Signed)
-----   Message from Pieter Partridge, DO sent at 06/19/2018  7:11 AM EDT ----- EEG is normal

## 2018-06-19 NOTE — Telephone Encounter (Signed)
Called and spoke with Pt, advised her of EEG results

## 2018-06-20 ENCOUNTER — Other Ambulatory Visit: Payer: Self-pay | Admitting: Family Medicine

## 2018-06-20 ENCOUNTER — Ambulatory Visit (INDEPENDENT_AMBULATORY_CARE_PROVIDER_SITE_OTHER): Payer: Medicare HMO | Admitting: Family Medicine

## 2018-06-20 ENCOUNTER — Encounter: Payer: Self-pay | Admitting: Family Medicine

## 2018-06-20 ENCOUNTER — Telehealth: Payer: Self-pay | Admitting: Family Medicine

## 2018-06-20 VITALS — BP 160/91 | HR 71 | Temp 98.0°F | Ht 62.0 in | Wt 184.0 lb

## 2018-06-20 DIAGNOSIS — E785 Hyperlipidemia, unspecified: Secondary | ICD-10-CM

## 2018-06-20 DIAGNOSIS — J441 Chronic obstructive pulmonary disease with (acute) exacerbation: Secondary | ICD-10-CM

## 2018-06-20 DIAGNOSIS — I10 Essential (primary) hypertension: Secondary | ICD-10-CM | POA: Diagnosis not present

## 2018-06-20 DIAGNOSIS — G459 Transient cerebral ischemic attack, unspecified: Secondary | ICD-10-CM | POA: Diagnosis not present

## 2018-06-20 DIAGNOSIS — G43709 Chronic migraine without aura, not intractable, without status migrainosus: Secondary | ICD-10-CM | POA: Diagnosis not present

## 2018-06-20 DIAGNOSIS — E119 Type 2 diabetes mellitus without complications: Secondary | ICD-10-CM

## 2018-06-20 MED ORDER — TOPIRAMATE 25 MG PO TABS: ORAL_TABLET | ORAL | 0 refills | Status: DC

## 2018-06-20 MED ORDER — PANTOPRAZOLE SODIUM 40 MG PO TBEC: 40.0000 mg | DELAYED_RELEASE_TABLET | Freq: Two times a day (BID) | ORAL | 5 refills | Status: DC

## 2018-06-20 MED ORDER — METFORMIN HCL ER 750 MG PO TB24: 750.0000 mg | ORAL_TABLET | Freq: Every day | ORAL | 5 refills | Status: DC

## 2018-06-20 MED ORDER — EZETIMIBE 10 MG PO TABS: 10.0000 mg | ORAL_TABLET | Freq: Every day | ORAL | 1 refills | Status: DC

## 2018-06-20 MED ORDER — ALBUTEROL SULFATE HFA 108 (90 BASE) MCG/ACT IN AERS: 2.0000 | INHALATION_SPRAY | Freq: Four times a day (QID) | RESPIRATORY_TRACT | 5 refills | Status: DC | PRN

## 2018-06-20 MED ORDER — METOPROLOL SUCCINATE ER 50 MG PO TB24: 50.0000 mg | ORAL_TABLET | Freq: Every day | ORAL | 1 refills | Status: DC

## 2018-06-20 MED ORDER — FEXOFENADINE HCL 180 MG PO TABS: 180.0000 mg | ORAL_TABLET | Freq: Every day | ORAL | 11 refills | Status: DC

## 2018-06-20 MED ORDER — FUROSEMIDE 20 MG PO TABS: 20.0000 mg | ORAL_TABLET | Freq: Every day | ORAL | 1 refills | Status: DC

## 2018-06-20 MED ORDER — FLUTICASONE FUROATE-VILANTEROL 100-25 MCG/INH IN AEPB: 1.0000 | INHALATION_SPRAY | Freq: Every day | RESPIRATORY_TRACT | 5 refills | Status: DC

## 2018-06-20 MED ORDER — ATORVASTATIN CALCIUM 40 MG PO TABS: ORAL_TABLET | ORAL | 1 refills | Status: DC

## 2018-06-20 MED ORDER — AMLODIPINE BESYLATE 5 MG PO TABS: ORAL_TABLET | ORAL | 1 refills | Status: DC

## 2018-06-20 MED ORDER — ALPRAZOLAM 1 MG PO TABS: ORAL_TABLET | ORAL | 2 refills | Status: DC

## 2018-06-20 NOTE — Telephone Encounter (Signed)
Pharmacy states that she needs a refill on xanax

## 2018-06-20 NOTE — Progress Notes (Signed)
Subjective:  Patient ID: Yvonne [name], female    DOB: 1951/01/12  Age: 67 y.o. MRN: [id number]  CC: Medical Management of Chronic Issues   HPI Yvonne [name] presents for follow-up of diabetes. Patient does not check blood sugar at home Patient denies symptoms such as polyuria, polydipsia, excessive hunger, nausea No significant hypoglycemic spells noted. Medications as noted below. Taking them regularly without complication/adverse reaction being reported today.    Follow-up of hypertension. Patient has no history of headache chest pain or shortness of breath or recent cough. Patient also denies symptoms of TIA such as numbness weakness lateralizing. Patient checks  blood pressure at home and has not had any elevated readings recently. Patient denies side effects from his medication. States taking it regularly. Patient reports that she continues to have episodes of focal weakness.  She was in the emergency room again several days ago.  She had right-sided weakness.  It resolved spontaneously.  She denies any palpitations and shortness of breath.  There is no known history of atrial fibrillation  Migraine headache continues to occur regularly sometimes lasting a couple of days at a time occurring 2-4 times monthly.  Pain is moderate to excruciating depending on the episode,  Idiosyncratically. . Depression screen [facility] 2/9 06/20/2018 06/06/2018 04/24/2018  Decreased Interest '2 2 2  '$ Down, Depressed, Hopeless '2 2 1  '$ PHQ - 2 Score '4 4 3  '$ Altered sleeping '3 3 2  '$ Tired, decreased energy '2 2 2  '$ Change in appetite '3 3 3  '$ Feeling bad or failure about yourself  2 2 0  Trouble concentrating 0 0 0  Moving slowly or fidgety/restless 0 0 0  Suicidal thoughts 0 0 0  PHQ-9 Score '14 14 10  '$ Some recent data might be hidden    History Yvonne [name] has a past medical history of Anxiety, Arthritis, Asthma, Chronic cough, Chronic otitis media (12/2017), Full dentures, GERD (gastroesophageal reflux disease),  History of stroke (09/2017), Hyperlipidemia, Hypertension, Non-insulin dependent type 2 diabetes mellitus ([place]), and Overactive bladder.   She has a past surgical history that includes Cholecystectomy; Colonoscopy (N/A, 08/17/2015); Abdominal hysterectomy; Lacrimal duct exploration (Bilateral); Cataract extraction w/ intraocular lens implant (Right); Vitrectomy (Left, 09/14/2015); Gas insertion (Right); and Myringotomy with tube placement (Bilateral, 01/28/2018).   Her family history includes Arthritis in her brother and mother; Asthma in her father; CVA in her mother; Cancer in her brother; Diabetes in her father and mother; Heart disease in her father; Hepatitis C in her sister; Stroke in her mother.She reports that she has never smoked. She has never used smokeless tobacco. She reports that she does not drink alcohol or use drugs.    ROS Review of Systems  Constitutional: Negative.   HENT: Negative.   Eyes: Negative for visual disturbance.  Respiratory: Negative for shortness of breath.   Cardiovascular: Negative for chest pain.  Gastrointestinal: Negative for abdominal pain.  Musculoskeletal: Negative for arthralgias.  Neurological: Positive for weakness and numbness.    Objective:  BP (!) 160/91 (BP Location: Left Arm, Cuff Size: Normal)   Pulse 71   Temp 98 F (36.7 C) (Oral)   Ht '5\' 2"'$  (1.575 m)   Wt 184 lb (83.5 kg)   BMI 33.65 kg/m   BP Readings from Last 3 Encounters:  06/20/18 (!) 160/91  06/12/18 (!) 157/81  06/12/18 114/90    Wt Readings from Last 3 Encounters:  06/20/18 184 lb (83.5 kg)  06/12/18 181 lb (82.1 kg)  06/12/18 181 lb (82.1  kg)     Physical Exam  Constitutional: She is oriented to person, place, and time. She appears well-developed and well-nourished. No distress.  HENT:  Head: Normocephalic and atraumatic.  Right Ear: External ear normal.  Left Ear: External ear normal.  Nose: Nose normal.  Mouth/Throat: Oropharynx is clear and moist.  Eyes:  Pupils are equal, round, and reactive to light. Conjunctivae and EOM are normal.  Neck: Normal range of motion. Neck supple. No thyromegaly present.  Cardiovascular: Normal rate, regular rhythm and normal heart sounds.  No murmur heard. Pulmonary/Chest: Effort normal and breath sounds normal. No respiratory distress. She has no wheezes. She has no rales.  Abdominal: Soft. Bowel sounds are normal. She exhibits no distension. There is no tenderness.  Lymphadenopathy:    She has no cervical adenopathy.  Neurological: She is alert and oriented to person, place, and time. She has normal reflexes.  Skin: Skin is warm and dry.  Psychiatric: She has a normal mood and affect. Her behavior is normal. Judgment and thought content normal.      Assessment & Plan:   Yvonne [name] was seen today for medical management of chronic issues.  Diagnoses and all orders for this visit:  Type 2 diabetes mellitus without complication, without long-term current use of insulin (HCC)  Hyperlipidemia with target LDL less than 70  Essential hypertension  Chronic migraine without aura without status migrainosus, not intractable  Other orders -     topiramate (TOPAMAX) 25 MG tablet; 1 nightly times 1 week.  Then 2 nightly for 1 week.  Then 3 nightly for 1 week.  Then 4 nightly.       I have discontinued [name]. Hust's aspirin. I am also having her start on topiramate. Additionally, I am having her maintain her HYDROcodone-acetaminophen, ezetimibe, fexofenadine, furosemide, albuterol, fluticasone furoate-vilanterol, amLODipine, gabapentin, pantoprazole, solifenacin, LANCETS ULTRA THIN, glucose blood, blood glucose meter kit and supplies, atorvastatin, REMIFEMIN, metFORMIN, ALPRAZolam, metoprolol succinate, and aspirin EC.  Allergies as of 06/20/2018      Reactions   Penicillins Hives   Has patient had a PCN reaction causing immediate rash, facial/tongue/throat swelling, SOB or lightheadedness with hypotension:  Yes Has patient had a PCN reaction causing severe rash involving mucus membranes or skin necrosis: Unk Has patient had a PCN reaction that required hospitalization: No Has patient had a PCN reaction occurring within the last 10 years: No If all of the above answers are "NO", then may proceed with Cephalosporin use.   Gabapentin Other (See Comments)   Causes a lot of lethargy at a higher dose   Lisinopril Cough   Soap Rash   DIAL soap      Medication List        Accurate as of 06/20/18  9:55 AM. Always use your most recent med list.          albuterol 108 (90 Base) MCG/ACT inhaler Commonly known as:  VENTOLIN HFA Inhale 2 puffs into the lungs every 6 (six) hours as needed for wheezing or shortness of breath.   ALPRAZolam 1 MG tablet Commonly known as:  XANAX TAKE 1 TABLET ('1MG'$  TOTAL) 3 (THREE)TIMES DAILY BY MOUTH   amLODipine 5 MG tablet Commonly known as:  NORVASC TAKE 1 TABLET (5 MG TOTAL) BY MOUTH DAILY.   aspirin EC 81 MG tablet Take 81 mg by mouth daily.   atorvastatin 40 MG tablet Commonly known as:  LIPITOR TAKE 1 TABLET EVERY DAY AT 6PM   blood glucose meter kit and  supplies Use as directed   ezetimibe 10 MG tablet Commonly known as:  ZETIA Take 1 tablet (10 mg total) by mouth daily. For cholesterol   fexofenadine 180 MG tablet Commonly known as:  ALLEGRA ALLERGY Take 1 tablet (180 mg total) by mouth daily.   fluticasone furoate-vilanterol 100-25 MCG/INH Aepb Commonly known as:  BREO ELLIPTA Inhale 1 puff into the lungs daily.   furosemide 20 MG tablet Commonly known as:  LASIX Take 1 tablet (20 mg total) by mouth daily.   gabapentin 300 MG capsule Commonly known as:  NEURONTIN Take 2 capsules (600 mg total) by mouth 4 (four) times daily.   glucose blood test strip Test once daily.   HYDROcodone-acetaminophen 10-325 MG tablet Commonly known as:  NORCO Take 1 tablet by mouth every 6 (six) hours as needed for moderate pain. Reported on  02/07/2016   LANCETS ULTRA THIN Misc Test once daily.   metFORMIN 750 MG 24 hr tablet Commonly known as:  GLUCOPHAGE-XR TAKE 1 TABLET (750 MG TOTAL) BY MOUTH DAILY WITH BREAKFAST.   metoprolol succinate 50 MG 24 hr tablet Commonly known as:  TOPROL-XL TAKE 1 TABLET (50 MG TOTAL) BY MOUTH DAILY. TAKE WITH OR IMMEDIATELY FOLLOWING A MEAL.   pantoprazole 40 MG tablet Commonly known as:  PROTONIX Take 1 tablet (40 mg total) by mouth 2 (two) times daily. For stomach   REMIFEMIN 20 MG Tabs Generic drug:  [place] Cohosh Take 1 tablet (20 mg total) by mouth daily.   solifenacin 10 MG tablet Commonly known as:  VESICARE Take 1 tablet (10 mg total) by mouth daily.   topiramate 25 MG tablet Commonly known as:  TOPAMAX 1 nightly times 1 week.  Then 2 nightly for 1 week.  Then 3 nightly for 1 week.  Then 4 nightly.        Follow-up: No follow-ups on file.  [name], M.D.

## 2018-06-20 NOTE — Telephone Encounter (Signed)
Patient aware rx sent to pharmacy.  

## 2018-06-20 NOTE — Telephone Encounter (Signed)
Being seen today 

## 2018-06-20 NOTE — Telephone Encounter (Signed)
I sent in the requested prescription 

## 2018-06-21 LAB — CBC WITH DIFFERENTIAL/PLATELET
BASOS ABS: 0 10*3/uL (ref 0.0–0.2)
Basos: 0 %
EOS (ABSOLUTE): 0.2 10*3/uL (ref 0.0–0.4)
EOS: 2 %
HEMATOCRIT: 39.8 % (ref 34.0–46.6)
HEMOGLOBIN: 13.1 g/dL (ref 11.1–15.9)
IMMATURE GRANS (ABS): 0 10*3/uL (ref 0.0–0.1)
IMMATURE GRANULOCYTES: 0 %
LYMPHS: 40 %
Lymphocytes Absolute: 3 10*3/uL (ref 0.7–3.1)
MCH: 29 pg (ref 26.6–33.0)
MCHC: 32.9 g/dL (ref 31.5–35.7)
MCV: 88 fL (ref 79–97)
MONOCYTES: 6 %
Monocytes Absolute: 0.4 10*3/uL (ref 0.1–0.9)
Neutrophils Absolute: 3.8 10*3/uL (ref 1.4–7.0)
Neutrophils: 52 %
Platelets: 269 10*3/uL (ref 150–450)
RBC: 4.52 x10E6/uL (ref 3.77–5.28)
RDW: 13.6 % (ref 12.3–15.4)
WBC: 7.4 10*3/uL (ref 3.4–10.8)

## 2018-06-21 LAB — THYROID PANEL WITH TSH
Free Thyroxine Index: 2.9 (ref 1.2–4.9)
T3 Uptake Ratio: 32 % (ref 24–39)
T4 TOTAL: 9.1 ug/dL (ref 4.5–12.0)
TSH: 3.04 u[IU]/mL (ref 0.450–4.500)

## 2018-06-21 LAB — CMP14+EGFR
ALBUMIN: 3.7 g/dL (ref 3.6–4.8)
ALK PHOS: 123 IU/L — AB (ref 39–117)
ALT: 21 IU/L (ref 0–32)
AST: 25 IU/L (ref 0–40)
Albumin/Globulin Ratio: 1.5 (ref 1.2–2.2)
BUN / CREAT RATIO: 22 (ref 12–28)
BUN: 18 mg/dL (ref 8–27)
Bilirubin Total: 0.3 mg/dL (ref 0.0–1.2)
CALCIUM: 9.2 mg/dL (ref 8.7–10.3)
CO2: 24 mmol/L (ref 20–29)
CREATININE: 0.83 mg/dL (ref 0.57–1.00)
Chloride: 102 mmol/L (ref 96–106)
GFR, EST AFRICAN AMERICAN: 84 mL/min/{1.73_m2} (ref >59)
GFR, EST NON AFRICAN AMERICAN: 73 mL/min/{1.73_m2} (ref >59)
GLUCOSE: 158 mg/dL — AB (ref 65–99)
Globulin, Total: 2.4 g/dL (ref 1.5–4.5)
Potassium: 4 mmol/L (ref 3.5–5.2)
Sodium: 143 mmol/L (ref 134–144)
TOTAL PROTEIN: 6.1 g/dL (ref 6.0–8.5)

## 2018-06-21 LAB — LIPID PANEL
CHOL/HDL RATIO: 4.3 ratio (ref 0.0–4.4)
CHOLESTEROL TOTAL: 197 mg/dL (ref 100–199)
HDL: 46 mg/dL (ref >39)
LDL Calculated: 116 mg/dL — ABNORMAL HIGH (ref 0–99)
Triglycerides: 175 mg/dL — ABNORMAL HIGH (ref 0–149)
VLDL CHOLESTEROL CAL: 35 mg/dL (ref 5–40)

## 2018-06-21 LAB — SEDIMENTATION RATE: Sed Rate: 14 mm/hr (ref 0–40)

## 2018-06-30 ENCOUNTER — Ambulatory Visit: Payer: Medicare HMO | Admitting: Neurology

## 2018-06-30 ENCOUNTER — Encounter

## 2018-07-02 ENCOUNTER — Encounter: Payer: Self-pay | Admitting: Family Medicine

## 2018-07-23 ENCOUNTER — Ambulatory Visit: Payer: Medicare HMO | Admitting: Family Medicine

## 2018-08-25 ENCOUNTER — Encounter: Payer: Medicare HMO | Admitting: *Deleted

## 2018-09-03 DIAGNOSIS — Z79899 Other long term (current) drug therapy: Secondary | ICD-10-CM | POA: Diagnosis not present

## 2018-09-03 DIAGNOSIS — M25551 Pain in right hip: Secondary | ICD-10-CM | POA: Diagnosis not present

## 2018-09-03 DIAGNOSIS — Z5181 Encounter for therapeutic drug level monitoring: Secondary | ICD-10-CM | POA: Diagnosis not present

## 2018-09-03 DIAGNOSIS — F1190 Opioid use, unspecified, uncomplicated: Secondary | ICD-10-CM | POA: Diagnosis not present

## 2018-09-03 DIAGNOSIS — G894 Chronic pain syndrome: Secondary | ICD-10-CM | POA: Diagnosis not present

## 2018-09-03 DIAGNOSIS — M79671 Pain in right foot: Secondary | ICD-10-CM | POA: Diagnosis not present

## 2018-09-17 ENCOUNTER — Encounter: Payer: Self-pay | Admitting: Family Medicine

## 2018-09-17 ENCOUNTER — Ambulatory Visit (INDEPENDENT_AMBULATORY_CARE_PROVIDER_SITE_OTHER): Payer: Medicare HMO | Admitting: Family Medicine

## 2018-09-17 VITALS — BP 157/87 | HR 79 | Temp 97.9°F | Ht 62.0 in | Wt 185.0 lb

## 2018-09-17 DIAGNOSIS — J40 Bronchitis, not specified as acute or chronic: Secondary | ICD-10-CM

## 2018-09-17 DIAGNOSIS — J849 Interstitial pulmonary disease, unspecified: Secondary | ICD-10-CM

## 2018-09-17 MED ORDER — PREDNISONE 20 MG PO TABS: 40.0000 mg | ORAL_TABLET | Freq: Every day | ORAL | 0 refills | Status: AC

## 2018-09-17 MED ORDER — BENZONATATE 100 MG PO CAPS: 100.0000 mg | ORAL_CAPSULE | Freq: Three times a day (TID) | ORAL | 0 refills | Status: DC | PRN

## 2018-09-17 MED ORDER — DOXYCYCLINE HYCLATE 100 MG PO TABS: 100.0000 mg | ORAL_TABLET | Freq: Two times a day (BID) | ORAL | 0 refills | Status: AC

## 2018-09-17 NOTE — Progress Notes (Signed)
Subjective: CC: cough PCP: [name], MD Yvonne [name] is a 67 y.o. female presenting to clinic today for:  1. Cough Patient reports onset of productive cough 1 week ago.  She notes that she now has associated sore throat.  She has bilateral ear fullness.  She notes a history of tympanostomy that is still in place.  No fevers, chills, nausea, vomiting or diarrhea.  She has had some wheeze and shortness of breath.  She has not used any over-the-counter products.  She has Brio and albuterol at home but is not using the albuterol.  She has grandchildren that are sick with URIs at this time.  History is significant for possible interstitial lung disease.  She was seen by pulmonology in 2017 and had imaging studies that suggested this.  She has not had any follow-up since that time.  ROS: Per HPI  Allergies  Allergen Reactions  . Penicillins Hives    Has patient had a PCN reaction causing immediate rash, facial/tongue/throat swelling, SOB or lightheadedness with hypotension: Yes Has patient had a PCN reaction causing severe rash involving mucus membranes or skin necrosis: Unk Has patient had a PCN reaction that required hospitalization: No Has patient had a PCN reaction occurring within the last 10 years: No If all of the above answers are "NO", then may proceed with Cephalosporin use.   . Gabapentin Other (See Comments)    Causes a lot of lethargy at a higher dose  . Lisinopril Cough  . Soap Rash    DIAL soap   Past Medical History:  Diagnosis Date  . Anxiety   . Arthritis    left hand  . Asthma    daily and prn inhalers  . Chronic cough   . Chronic otitis media 12/2017  . Full dentures   . GERD (gastroesophageal reflux disease)   . History of stroke 09/2017   weakness right hand, numbness right side face  . Hyperlipidemia   . Hypertension    states under control with meds., has been on med. x a long time, per pt.  . Non-insulin dependent type 2 diabetes mellitus  ([place])   . Overactive bladder     Current Outpatient Medications:  .  albuterol (VENTOLIN HFA) 108 (90 Base) MCG/ACT inhaler, Inhale 2 puffs into the lungs every 6 (six) hours as needed for wheezing or shortness of breath., Disp: 18 Inhaler, Rfl: 5 .  ALPRAZolam (XANAX) 1 MG tablet, TAKE 1 TABLET ('1MG'$  TOTAL) 3 (THREE)TIMES DAILY BY MOUTH, Disp: 90 tablet, Rfl: 2 .  ALPRAZolam (XANAX) 1 MG tablet, TAKE 1 TABLET ('1MG'$  TOTAL) 3 (THREE)TIMES DAILY BY MOUTH, Disp: 90 tablet, Rfl: 2 .  amLODipine (NORVASC) 5 MG tablet, TAKE 1 TABLET (5 MG TOTAL) BY MOUTH DAILY., Disp: 90 tablet, Rfl: 1 .  aspirin EC 81 MG tablet, Take 81 mg by mouth daily., Disp: , Rfl:  .  atorvastatin (LIPITOR) 40 MG tablet, TAKE 1 TABLET EVERY DAY AT 6PM, Disp: 90 tablet, Rfl: 1 .  blood glucose meter kit and supplies, Use as directed, Disp: 1 each, Rfl: 0 .  ezetimibe (ZETIA) 10 MG tablet, Take 1 tablet (10 mg total) by mouth daily. For cholesterol, Disp: 90 tablet, Rfl: 1 .  fexofenadine (ALLEGRA ALLERGY) 180 MG tablet, Take 1 tablet (180 mg total) by mouth daily., Disp: 30 tablet, Rfl: 11 .  fluticasone furoate-vilanterol (BREO ELLIPTA) 100-25 MCG/INH AEPB, Inhale 1 puff into the lungs daily., Disp: 180 each, Rfl: 5 .  furosemide (  LASIX) 20 MG tablet, Take 1 tablet (20 mg total) by mouth daily., Disp: 90 tablet, Rfl: 1 .  gabapentin (NEURONTIN) 300 MG capsule, Take 2 capsules (600 mg total) by mouth 4 (four) times daily. (Patient taking differently: Take 300 mg by mouth at bedtime. ), Disp: 480 capsule, Rfl: 5 .  glucose blood test strip, Test once daily. (Patient taking differently: Test up to 4 times daily), Disp: 100 each, Rfl: 12 .  HYDROcodone-acetaminophen (NORCO) 10-325 MG tablet, Take 1 tablet by mouth every 6 (six) hours as needed for moderate pain. Reported on 02/07/2016 (Patient taking differently: Take 1 tablet by mouth every 8 (eight) hours as needed for moderate pain. Reported on 02/07/2016), Disp: 60 tablet, Rfl: 0 .   LANCETS ULTRA THIN MISC, Test once daily., Disp: 100 each, Rfl: prn .  metFORMIN (GLUCOPHAGE-XR) 750 MG 24 hr tablet, Take 1 tablet (750 mg total) by mouth daily with breakfast., Disp: 30 tablet, Rfl: 5 .  metoprolol succinate (TOPROL-XL) 50 MG 24 hr tablet, Take 1 tablet (50 mg total) by mouth daily. Take with or immediately following a meal., Disp: 90 tablet, Rfl: 1 .  pantoprazole (PROTONIX) 40 MG tablet, Take 1 tablet (40 mg total) by mouth 2 (two) times daily. For stomach, Disp: 60 tablet, Rfl: 5 .  REMIFEMIN 20 MG TABS, Take 1 tablet (20 mg total) by mouth daily., Disp: 60 each, Rfl: 0 .  solifenacin (VESICARE) 10 MG tablet, Take 1 tablet (10 mg total) by mouth daily. (Patient taking differently: Take 10 mg by mouth daily as needed (for overactive bladder symptoms). ), Disp: 90 tablet, Rfl: 0 .  topiramate (TOPAMAX) 25 MG tablet, 1 nightly times 1 week.  Then 2 nightly for 1 week.  Then 3 nightly for 1 week.  Then 4 nightly., Disp: 120 tablet, Rfl: 0 Social History   Socioeconomic History  . Marital status: Married    Spouse name: Not on file  . Number of children: 3  . Years of education: Not on file  . Highest education level: Not on file  Occupational History  . Occupation: unemployed  Social Needs  . Financial resource strain: Not on file  . Food insecurity:    Worry: Not on file    Inability: Not on file  . Transportation needs:    Medical: Not on file    Non-medical: Not on file  Tobacco Use  . Smoking status: Never Smoker  . Smokeless tobacco: Never Used  Substance and Sexual Activity  . Alcohol use: No  . Drug use: No  . Sexual activity: Yes  Lifestyle  . Physical activity:    Days per week: Not on file    Minutes per session: Not on file  . Stress: Not on file  Relationships  . Social connections:    Talks on phone: Not on file    Gets together: Not on file    Attends religious service: Not on file    Active member of club or organization: Not on file     Attends meetings of clubs or organizations: Not on file    Relationship status: Not on file  . Intimate partner violence:    Fear of current or ex partner: Not on file    Emotionally abused: Not on file    Physically abused: Not on file    Forced sexual activity: Not on file  Other Topics Concern  . Not on file  Social History Narrative   Lives at home home  with husband with son, daughter in law and 3 children.    Disabled.   Family History  Problem Relation Age of Onset  . Arthritis Mother   . Diabetes Mother   . CVA Mother   . Stroke Mother   . Diabetes Father   . Heart disease Father        CABG.  Does not know age of onset  . Asthma Father   . Hepatitis C Sister   . Arthritis Brother   . Cancer Brother        metastic cancer    Objective: Office vital signs reviewed. BP (!) 157/87   Pulse 79   Temp 97.9 F (36.6 C) (Oral)   Ht '5\' 2"'$  (1.575 m)   Wt 185 lb (83.9 kg)   SpO2 97%   BMI 33.84 kg/m   Physical Examination:  General: Awake, alert, nontoxic, No acute distress HEENT: Normal    Neck: No masses palpated. No lymphadenopathy    Ears: Tympanic membranes with tympanostomy tubes bilaterally, normal light reflex, no erythema, no bulging    Eyes: PERRLA, extraocular membranes intact, sclera white    Nose: nasal turbinates moist, clear nasal discharge    Throat: moist mucus membranes, no erythema, no tonsillar exudate.  Airway is patent Cardio: regular rate and rhythm, S1S2 heard, no murmurs appreciated Pulm: clear to auscultation bilaterally, no wheezes, rhonchi or rales; normal work of breathing on room air  Assessment/ Plan: 66 y.o. female   1. Bronchitis Systolic blood pressure slightly elevated.  Vital signs otherwise within normal limits; patient is afebrile with normal oxygenation on room air.  Empirically treat with antibiotics for presumed bacterial bronchitis.  Doxycycline p.o. twice daily for 7 days and prednisone burst sent given history of  underlying lung disease.  I highly recommended that patient follow-up with PCP in the next couple weeks for recheck as well as her pulmonologist for continued surveillance of ILD.  Reasons for return discussed.  Follow-up PRN.  2. Interstitial lung disease ([place])   No orders of the defined types were placed in this encounter.  Meds ordered this encounter  Medications  . predniSONE (DELTASONE) 20 MG tablet    Sig: Take 2 tablets (40 mg total) by mouth daily with breakfast for 5 days.    Dispense:  10 tablet    Refill:  0  . doxycycline (VIBRA-TABS) 100 MG tablet    Sig: Take 1 tablet (100 mg total) by mouth 2 (two) times daily for 7 days.    Dispense:  14 tablet    Refill:  0  . benzonatate (TESSALON PERLES) 100 MG capsule    Sig: Take 1 capsule (100 mg total) by mouth 3 (three) times daily as needed.    Dispense:  20 capsule    Refill:  [place], DO [place] [phone]

## 2018-09-17 NOTE — Patient Instructions (Addendum)
I reviewed your records from 2017.  You saw Dr. Chase Caller at [place].  From that record, there was concern for something called interstitial lung disease.  This is not COPD.  I am going to treat you for bronchitis with steroids and an antibiotic.  If your symptoms worsen, I want you to be reevaluated.  Follow-up with Dr. [name] in 2 to 4 weeks for recheck.

## 2018-09-29 ENCOUNTER — Encounter: Payer: Medicare HMO | Admitting: *Deleted

## 2018-09-29 DIAGNOSIS — R0602 Shortness of breath: Secondary | ICD-10-CM | POA: Diagnosis not present

## 2018-09-29 DIAGNOSIS — R0683 Snoring: Secondary | ICD-10-CM | POA: Diagnosis not present

## 2018-09-29 DIAGNOSIS — R05 Cough: Secondary | ICD-10-CM | POA: Diagnosis not present

## 2018-09-29 DIAGNOSIS — G4719 Other hypersomnia: Secondary | ICD-10-CM | POA: Diagnosis not present

## 2018-09-30 ENCOUNTER — Ambulatory Visit (INDEPENDENT_AMBULATORY_CARE_PROVIDER_SITE_OTHER): Payer: Medicare HMO | Admitting: Family Medicine

## 2018-09-30 ENCOUNTER — Encounter: Payer: Self-pay | Admitting: Family Medicine

## 2018-09-30 VITALS — BP 171/92 | HR 82 | Temp 97.0°F | Ht 62.0 in | Wt 184.6 lb

## 2018-09-30 DIAGNOSIS — J329 Chronic sinusitis, unspecified: Secondary | ICD-10-CM

## 2018-09-30 DIAGNOSIS — J40 Bronchitis, not specified as acute or chronic: Secondary | ICD-10-CM

## 2018-09-30 DIAGNOSIS — R05 Cough: Secondary | ICD-10-CM

## 2018-09-30 DIAGNOSIS — R059 Cough, unspecified: Secondary | ICD-10-CM

## 2018-09-30 MED ORDER — HYDROCODONE-HOMATROPINE 5-1.5 MG/5ML PO SYRP: 5.0000 mL | ORAL_SOLUTION | Freq: Four times a day (QID) | ORAL | 0 refills | Status: AC | PRN

## 2018-09-30 MED ORDER — LEVOFLOXACIN 500 MG PO TABS: 500.0000 mg | ORAL_TABLET | Freq: Every day | ORAL | 0 refills | Status: DC

## 2018-09-30 NOTE — Progress Notes (Signed)
Chief Complaint  Patient presents with  . Cough    x 1 day- no OTC  . Nasal Congestion  . Sore Throat  . Headache    HPI  Patient presents today for Patient presents with upper respiratory congestion. Rhinorrhea that is frequently purulent. Facial and frontal pressure.  There is moderate sore throat. Patient reports coughing frequently as well.  yellow sputum noted. There is no fever, chills or sweats. The patient denies being short of breath. Onset was 5 days ago. Gradually worsening. Tried OTC's without improvement. Tessalon not helping either. Due for anxiety checkup as well.. Will set that up for next month. Has sleep study next week. Also seeing cardiology soon. Set up by pulmonary. He also wants he to see GI to check for silent reflux causing the cough.  PMH: Smoking status noted ROS: Review of Systems  Constitutional: Negative.  Negative for activity change, appetite change, chills and fever.  HENT: Positive for congestion, postnasal drip, rhinorrhea and sinus pressure. Negative for ear discharge, ear pain, hearing loss, nosebleeds, sneezing and trouble swallowing.   Eyes: Negative for visual disturbance.  Respiratory: Positive for cough and shortness of breath. Negative for chest tightness.   Cardiovascular: Negative for chest pain and palpitations.  Gastrointestinal: Negative for abdominal pain.  Musculoskeletal: Negative for arthralgias.  Skin: Negative for rash.    Objective: BP (!) 171/92   Pulse 82   Temp (!) 97 F (36.1 C) (Oral)   Ht 5\' 2"  (1.575 m)   Wt 184 lb 9.6 oz (83.7 kg)   SpO2 96%   BMI 33.76 kg/m  Gen: NAD, alert, cooperative with exam HEENT: NCAT, Nasal passages swollen, red TMS RED CV: RRR, good S1/S2, no murmur Resp: Bronchitis changes with scattered wheezes, non-labored Ext: No edema, warm Neuro: Alert and oriented, No gross deficits  Assessment and plan:  1. Sinobronchitis   2. Cough     Meds ordered this encounter  Medications  .  levofloxacin (LEVAQUIN) 500 MG tablet    Sig: Take 1 tablet (500 mg total) by mouth daily. For 10 days    Dispense:  10 tablet    Refill:  0  . HYDROcodone-homatropine (HYCODAN) 5-1.5 MG/5ML syrup    Sig: Take 5 mLs by mouth every 6 (six) hours as needed for up to 5 days for cough.    Dispense:  100 mL    Refill:  0    Orders Placed This Encounter  Procedures  . Ambulatory referral to Gastroenterology    Referral Priority:   Routine    Referral Type:   Consultation    Referral Reason:   Specialty Services Required    Number of Visits Requested:   1    Follow up as needed.  [name], MD

## 2018-10-01 DIAGNOSIS — G4710 Hypersomnia, unspecified: Secondary | ICD-10-CM | POA: Diagnosis not present

## 2018-10-01 DIAGNOSIS — R0683 Snoring: Secondary | ICD-10-CM | POA: Diagnosis not present

## 2018-10-06 DIAGNOSIS — I517 Cardiomegaly: Secondary | ICD-10-CM | POA: Diagnosis not present

## 2018-10-06 DIAGNOSIS — R05 Cough: Secondary | ICD-10-CM | POA: Diagnosis not present

## 2018-10-06 DIAGNOSIS — I519 Heart disease, unspecified: Secondary | ICD-10-CM | POA: Diagnosis not present

## 2018-10-07 ENCOUNTER — Encounter: Payer: Medicare HMO | Admitting: *Deleted

## 2018-10-10 ENCOUNTER — Other Ambulatory Visit: Payer: Self-pay | Admitting: Family Medicine

## 2018-10-11 DIAGNOSIS — G4710 Hypersomnia, unspecified: Secondary | ICD-10-CM | POA: Diagnosis not present

## 2018-10-11 DIAGNOSIS — I10 Essential (primary) hypertension: Secondary | ICD-10-CM | POA: Diagnosis not present

## 2018-10-11 DIAGNOSIS — G4733 Obstructive sleep apnea (adult) (pediatric): Secondary | ICD-10-CM | POA: Diagnosis not present

## 2018-10-13 NOTE — Telephone Encounter (Signed)
Last seen 09/30/18  Dr [name]

## 2018-10-14 ENCOUNTER — Telehealth: Payer: Self-pay | Admitting: Family Medicine

## 2018-10-14 DIAGNOSIS — G4733 Obstructive sleep apnea (adult) (pediatric): Secondary | ICD-10-CM | POA: Diagnosis not present

## 2018-10-14 DIAGNOSIS — J3089 Other allergic rhinitis: Secondary | ICD-10-CM | POA: Diagnosis not present

## 2018-10-14 NOTE — Telephone Encounter (Signed)
LMTCB- patient NTBS. Last seen for check up 7/26. Patient was seen 10/23 & 11/5 for acute.

## 2018-10-14 NOTE — Telephone Encounter (Signed)
What is the name of the medication? ALPRAZolam (XANAX) 1 MG tablet [Pharmacy Med Name: ALPRAZOLAM 1 MG TABLET]  Have you contacted your pharmacy to request a refill? yes  Which pharmacy would you like this sent to? [facility]   Patient notified that their request is being sent to the clinical staff for review and that they should receive a call once it is complete. If they do not receive a call within 24 hours they can check with their pharmacy or our office.

## 2018-10-16 ENCOUNTER — Ambulatory Visit (INDEPENDENT_AMBULATORY_CARE_PROVIDER_SITE_OTHER): Payer: Medicare HMO | Admitting: Internal Medicine

## 2018-10-17 DIAGNOSIS — H02822 Cysts of right lower eyelid: Secondary | ICD-10-CM | POA: Diagnosis not present

## 2018-10-17 DIAGNOSIS — L209 Atopic dermatitis, unspecified: Secondary | ICD-10-CM | POA: Diagnosis not present

## 2018-10-17 DIAGNOSIS — H35341 Macular cyst, hole, or pseudohole, right eye: Secondary | ICD-10-CM | POA: Diagnosis not present

## 2018-10-17 DIAGNOSIS — Z961 Presence of intraocular lens: Secondary | ICD-10-CM | POA: Diagnosis not present

## 2018-10-17 NOTE — Telephone Encounter (Signed)
Patient aware she will need to be seen for refills

## 2018-10-21 DIAGNOSIS — G4733 Obstructive sleep apnea (adult) (pediatric): Secondary | ICD-10-CM | POA: Diagnosis not present

## 2018-10-22 ENCOUNTER — Other Ambulatory Visit: Payer: Self-pay | Admitting: Family Medicine

## 2018-10-22 ENCOUNTER — Ambulatory Visit (INDEPENDENT_AMBULATORY_CARE_PROVIDER_SITE_OTHER): Payer: Medicare HMO | Admitting: Family Medicine

## 2018-10-22 ENCOUNTER — Telehealth: Payer: Self-pay | Admitting: *Deleted

## 2018-10-22 ENCOUNTER — Encounter: Payer: Self-pay | Admitting: Family Medicine

## 2018-10-22 VITALS — BP 130/80 | HR 74 | Temp 97.4°F | Ht 62.0 in | Wt 184.8 lb

## 2018-10-22 DIAGNOSIS — N390 Urinary tract infection, site not specified: Secondary | ICD-10-CM

## 2018-10-22 DIAGNOSIS — E756 Lipid storage disorder, unspecified: Secondary | ICD-10-CM

## 2018-10-22 DIAGNOSIS — F1320 Sedative, hypnotic or anxiolytic dependence, uncomplicated: Secondary | ICD-10-CM | POA: Diagnosis not present

## 2018-10-22 DIAGNOSIS — I5032 Chronic diastolic (congestive) heart failure: Secondary | ICD-10-CM | POA: Diagnosis not present

## 2018-10-22 DIAGNOSIS — Z23 Encounter for immunization: Secondary | ICD-10-CM

## 2018-10-22 DIAGNOSIS — I10 Essential (primary) hypertension: Secondary | ICD-10-CM

## 2018-10-22 DIAGNOSIS — I69359 Hemiplegia and hemiparesis following cerebral infarction affecting unspecified side: Secondary | ICD-10-CM

## 2018-10-22 DIAGNOSIS — E1169 Type 2 diabetes mellitus with other specified complication: Secondary | ICD-10-CM | POA: Diagnosis not present

## 2018-10-22 DIAGNOSIS — E6609 Other obesity due to excess calories: Secondary | ICD-10-CM

## 2018-10-22 DIAGNOSIS — J441 Chronic obstructive pulmonary disease with (acute) exacerbation: Secondary | ICD-10-CM

## 2018-10-22 DIAGNOSIS — R319 Hematuria, unspecified: Secondary | ICD-10-CM | POA: Diagnosis not present

## 2018-10-22 DIAGNOSIS — Z6833 Body mass index (BMI) 33.0-33.9, adult: Secondary | ICD-10-CM

## 2018-10-22 LAB — URINALYSIS
Bilirubin, UA: NEGATIVE
GLUCOSE, UA: NEGATIVE
Ketones, UA: NEGATIVE
NITRITE UA: POSITIVE — AB
PH UA: 5.5 (ref 5.0–7.5)
PROTEIN UA: NEGATIVE
SPEC GRAV UA: 1.025 (ref 1.005–1.030)
Urobilinogen, Ur: 0.2 mg/dL (ref 0.2–1.0)

## 2018-10-22 LAB — BAYER DCA HB A1C WAIVED: HB A1C (BAYER DCA - WAIVED): 8.3 % — ABNORMAL HIGH (ref <7.0)

## 2018-10-22 MED ORDER — GABAPENTIN 300 MG PO CAPS: 600.0000 mg | ORAL_CAPSULE | Freq: Four times a day (QID) | ORAL | 5 refills | Status: DC

## 2018-10-22 MED ORDER — FUROSEMIDE 20 MG PO TABS: 20.0000 mg | ORAL_TABLET | Freq: Every day | ORAL | 1 refills | Status: DC

## 2018-10-22 MED ORDER — REMIFEMIN 20 MG PO TABS: 20.0000 mg | ORAL_TABLET | Freq: Every day | ORAL | 0 refills | Status: DC

## 2018-10-22 MED ORDER — PANTOPRAZOLE SODIUM 40 MG PO TBEC: 40.0000 mg | DELAYED_RELEASE_TABLET | Freq: Two times a day (BID) | ORAL | 5 refills | Status: DC

## 2018-10-22 MED ORDER — METOPROLOL SUCCINATE ER 50 MG PO TB24: 50.0000 mg | ORAL_TABLET | Freq: Every day | ORAL | 1 refills | Status: DC

## 2018-10-22 MED ORDER — AMLODIPINE BESYLATE 5 MG PO TABS: ORAL_TABLET | ORAL | 1 refills | Status: DC

## 2018-10-22 MED ORDER — OXYBUTYNIN CHLORIDE ER 15 MG PO TB24: ORAL_TABLET | ORAL | 1 refills | Status: DC

## 2018-10-22 MED ORDER — LEVOFLOXACIN 500 MG PO TABS: 500.0000 mg | ORAL_TABLET | Freq: Every day | ORAL | 0 refills | Status: DC

## 2018-10-22 MED ORDER — METFORMIN HCL ER 750 MG PO TB24: 750.0000 mg | ORAL_TABLET | Freq: Every day | ORAL | 5 refills | Status: DC

## 2018-10-22 MED ORDER — FLUTICASONE FUROATE-VILANTEROL 100-25 MCG/INH IN AEPB: 1.0000 | INHALATION_SPRAY | Freq: Every day | RESPIRATORY_TRACT | 5 refills | Status: DC

## 2018-10-22 MED ORDER — TOPIRAMATE 25 MG PO TABS: ORAL_TABLET | ORAL | 0 refills | Status: DC

## 2018-10-22 MED ORDER — CIPROFLOXACIN HCL 250 MG PO TABS: 250.0000 mg | ORAL_TABLET | Freq: Two times a day (BID) | ORAL | 0 refills | Status: DC

## 2018-10-22 MED ORDER — ATORVASTATIN CALCIUM 40 MG PO TABS: ORAL_TABLET | ORAL | 1 refills | Status: DC

## 2018-10-22 MED ORDER — HYDROCODONE-ACETAMINOPHEN 10-325 MG PO TABS: 1.0000 | ORAL_TABLET | Freq: Four times a day (QID) | ORAL | 0 refills | Status: DC | PRN

## 2018-10-22 MED ORDER — ALPRAZOLAM 1 MG PO TABS: ORAL_TABLET | ORAL | 0 refills | Status: DC

## 2018-10-22 MED ORDER — ALBUTEROL SULFATE HFA 108 (90 BASE) MCG/ACT IN AERS: 2.0000 | INHALATION_SPRAY | Freq: Four times a day (QID) | RESPIRATORY_TRACT | 5 refills | Status: DC | PRN

## 2018-10-22 MED ORDER — AMLODIPINE BESYLATE 10 MG PO TABS: ORAL_TABLET | ORAL | 1 refills | Status: DC

## 2018-10-22 MED ORDER — SOLIFENACIN SUCCINATE 10 MG PO TABS: 10.0000 mg | ORAL_TABLET | Freq: Every day | ORAL | 0 refills | Status: DC

## 2018-10-22 MED ORDER — EZETIMIBE 10 MG PO TABS: 10.0000 mg | ORAL_TABLET | Freq: Every day | ORAL | 1 refills | Status: DC

## 2018-10-22 NOTE — Addendum Note (Signed)
Addended by: [name] on: 10/22/2018 09:43 AM   Modules accepted: Orders

## 2018-10-22 NOTE — Progress Notes (Addendum)
Subjective:  Patient ID: Yvonne [name],  female    DOB: [birth year]  Age: 67 y.o.    CC: Medication Refill and Medical Management of Chronic Issues   HPI ANTHA NIDAY presents for  follow-up of hypertension. Patient has no history of headache chest pain or  symptoms of TIA such as numbness weakness lateralizing. Patient denies side effects from medication. States taking it regularly.  Patient also  in for follow-up of elevated cholesterol. Doing well without complaints on current medication. Denies side effects  including myalgia and arthralgia and nausea. Also in today for liver function testing. Currently no chest pain, shortness of breath or other cardiovascular related symptoms noted.  Follow-up of diabetes. Patient does check blood sugar at home. Readings run between 100 and 150 Patient denies symptoms such as excessive hunger or urinary frequency, excessive hunger, nausea No significant hypoglycemic spells noted. Medications reviewed. Pt reports taking them regularly. Pt. denies complication/adverse reaction today.   Pt. Recently diagnosed with severe sleep apnea. Awaiting machine to start CPAP. She was told her dyspnea would improve with use. Feels tired all the time. That should improve too.   Needs refills of xanax has chronic anxiety and long term dependence on the medication. She is also chronically depressed Depression screen [facility] 2/9 10/22/2018 09/30/2018 06/20/2018 06/06/2018 04/24/2018  Decreased Interest '2 2 2 2 2  '$ Down, Depressed, Hopeless '3 2 2 2 1  '$ PHQ - 2 Score '5 4 4 4 3  '$ Altered sleeping '3 2 3 3 2  '$ Tired, decreased energy '2 2 2 2 2  '$ Change in appetite '3 3 3 3 3  '$ Feeling bad or failure about yourself  '3 1 2 2 '$ 0  Trouble concentrating 3 0 0 0 0  Moving slowly or fidgety/restless 2 2 0 0 0  Suicidal thoughts 0 0 0 0 0  PHQ-9 Score '21 14 14 14 10  '$ Some recent data might be hidden     History Sheba has a past medical history of Anxiety, Arthritis, Asthma, Chronic  cough, Chronic otitis media (12/2017), Full dentures, GERD (gastroesophageal reflux disease), History of stroke (09/2017), Hyperlipidemia, Hypertension, Non-insulin dependent type 2 diabetes mellitus ([place]), Overactive bladder, Recurrent acute suppurative otitis media without spontaneous rupture of left tympanic membrane (02/19/2018), and Transient cerebral ischemia.   She has a past surgical history that includes Cholecystectomy; Colonoscopy (N/A, 08/17/2015); Abdominal hysterectomy; Lacrimal duct exploration (Bilateral); Cataract extraction w/ intraocular lens implant (Right); Vitrectomy (Left, 09/14/2015); Gas insertion (Right); and Myringotomy with tube placement (Bilateral, 01/28/2018).   Her family history includes Arthritis in her brother and mother; Asthma in her father; CVA in her mother; Cancer in her brother; Diabetes in her father and mother; Heart disease in her father; Hepatitis C in her sister; Stroke in her mother.She reports that she has never smoked. She has never used smokeless tobacco. She reports that she does not drink alcohol or use drugs.  Current Outpatient Medications on File Prior to Visit  Medication Sig Dispense Refill  . aspirin EC 81 MG tablet Take 81 mg by mouth daily.    . blood glucose meter kit and supplies Use as directed 1 each 0  . fexofenadine (ALLEGRA ALLERGY) 180 MG tablet Take 1 tablet (180 mg total) by mouth daily. 30 tablet 11  . glucose blood test strip Test once daily. (Patient taking differently: Test up to 4 times daily) 100 each 12  . LANCETS ULTRA THIN MISC Test once daily. 100 each prn   No  current facility-administered medications on file prior to visit.     ROS Review of Systems  Constitutional: Positive for fatigue. Negative for activity change and fever.  HENT: Negative for congestion.   Eyes: Negative for visual disturbance.  Respiratory: Positive for cough and shortness of breath.   Cardiovascular: Negative for chest pain.  Gastrointestinal:  Negative for abdominal pain, constipation, diarrhea, nausea and vomiting.  Genitourinary: Negative for difficulty urinating.  Musculoskeletal: Negative for arthralgias and myalgias.  Neurological: Negative for headaches.  Psychiatric/Behavioral: Negative for sleep disturbance.    Objective:  BP 130/80   Pulse 74   Temp (!) 97.4 F (36.3 C)   Ht '5\' 2"'$  (1.575 m)   Wt 184 lb 12.8 oz (83.8 kg)   BMI 33.80 kg/m   BP Readings from Last 3 Encounters:  10/22/18 130/80  09/30/18 (!) 171/92  09/17/18 (!) 157/87    Wt Readings from Last 3 Encounters:  10/22/18 184 lb 12.8 oz (83.8 kg)  09/30/18 184 lb 9.6 oz (83.7 kg)  09/17/18 185 lb (83.9 kg)     Physical Exam  Constitutional: She is oriented to person, place, and time. She appears well-developed and well-nourished. No distress.  HENT:  Head: Normocephalic and atraumatic.  Right Ear: External ear normal.  Left Ear: External ear normal.  Nose: Nose normal.  Mouth/Throat: Oropharynx is clear and moist.  Eyes: Pupils are equal, round, and reactive to light. Conjunctivae and EOM are normal.  Neck: Normal range of motion. Neck supple. No thyromegaly present.  Cardiovascular: Normal rate, regular rhythm and normal heart sounds.  No murmur heard. Pulmonary/Chest: Effort normal. No respiratory distress. She has wheezes. She has no rales. She exhibits no tenderness.  Abdominal: Soft. Bowel sounds are normal. She exhibits no distension. There is no tenderness.  Lymphadenopathy:    She has no cervical adenopathy.  Neurological: She is alert and oriented to person, place, and time. She has normal reflexes.  Skin: Skin is warm and dry.  Psychiatric: Her behavior is normal. Thought content normal. Her mood appears anxious. Her affect is blunt. Cognition and memory are normal.    Diabetic Foot Exam - Simple   No data filed        Assessment & Plan:   Terrill was seen today for medication refill and medical management of chronic  issues.  Diagnoses and all orders for this visit:  Essential hypertension -     CMP14+EGFR -     Lipid panel -     Urinalysis -     Bayer DCA Hb A1c Waived -     Microalbumin / creatinine urine ratio  Chronic diastolic CHF (congestive heart failure) (HCC) -     CMP14+EGFR -     Lipid panel -     Urinalysis -     Bayer DCA Hb A1c Waived -     Microalbumin / creatinine urine ratio  COPD exacerbation (HCC) -     albuterol (VENTOLIN HFA) 108 (90 Base) MCG/ACT inhaler; Inhale 2 puffs into the lungs every 6 (six) hours as needed for wheezing or shortness of breath. -     fluticasone furoate-vilanterol (BREO ELLIPTA) 100-25 MCG/INH AEPB; Inhale 1 puff into the lungs daily.  Diabetic lipidosis (HCC) -     metFORMIN (GLUCOPHAGE-XR) 750 MG 24 hr tablet; Take 1 tablet (750 mg total) by mouth daily with breakfast.  Hemiparesis affecting dominant side as late effect of cerebrovascular accident (CVA) ([place])  Class 1 obesity due to excess calories with  serious comorbidity and body mass index (BMI) of 33.0 to 33.9 in adult  Benzodiazepine dependence, continuous (HCC)  Need for immunization against influenza -     Flu Vaccine QUAD 36+ mos IM  Urinary tract infection with hematuria, site unspecified -     Urine Culture  Other orders -     Pneumococcal polysaccharide vaccine 23-valent greater than or equal to 2yo subcutaneous/IM -     Discontinue: amLODipine (NORVASC) 5 MG tablet; TAKE 1 TABLET (5 MG TOTAL) BY MOUTH DAILY. -     atorvastatin (LIPITOR) 40 MG tablet; TAKE 1 TABLET EVERY DAY AT 6PM -     ezetimibe (ZETIA) 10 MG tablet; Take 1 tablet (10 mg total) by mouth daily. For cholesterol -     furosemide (LASIX) 20 MG tablet; Take 1 tablet (20 mg total) by mouth daily. -     levofloxacin (LEVAQUIN) 500 MG tablet; Take 1 tablet (500 mg total) by mouth daily. For 10 days -     metoprolol succinate (TOPROL-XL) 50 MG 24 hr tablet; Take 1 tablet (50 mg total) by mouth daily. Take with or  immediately following a meal. -     pantoprazole (PROTONIX) 40 MG tablet; Take 1 tablet (40 mg total) by mouth 2 (two) times daily. For stomach -     solifenacin (VESICARE) 10 MG tablet; Take 1 tablet (10 mg total) by mouth daily. -     topiramate (TOPAMAX) 25 MG tablet; 1 nightly times 1 week.  Then 2 nightly for 1 week.  Then 3 nightly for 1 week.  Then 4 nightly. -     REMIFEMIN 20 MG TABS; Take 1 tablet (20 mg total) by mouth daily. -     ALPRAZolam (XANAX) 1 MG tablet; TAKE 1 TABLET ('1MG'$  TOTAL) 3 (THREE)TIMES DAILY BY MOUTH -     gabapentin (NEURONTIN) 300 MG capsule; Take 2 capsules (600 mg total) by mouth 4 (four) times daily. -     Discontinue: HYDROcodone-acetaminophen (NORCO) 10-325 MG tablet; Take 1 tablet by mouth every 6 (six) hours as needed for moderate pain. Reported on 02/07/2016 -     Cancel: CT ABDOMEN PELVIS W CONTRAST; Future -     ciprofloxacin (CIPRO) 250 MG tablet; Take 1 tablet (250 mg total) by mouth 2 (two) times daily. -     amLODipine (NORVASC) 10 MG tablet; TAKE 1 TABLET (5 MG TOTAL) BY MOUTH DAILY.   I have discontinued [name] Nylund's HYDROcodone-acetaminophen, amLODipine, and HYDROcodone-acetaminophen. I have also changed her amLODipine. Additionally, I am having her start on ciprofloxacin. Lastly, I am having her maintain her LANCETS ULTRA THIN, glucose blood, blood glucose meter kit and supplies, aspirin EC, fexofenadine, albuterol, atorvastatin, ezetimibe, fluticasone furoate-vilanterol, furosemide, levofloxacin, metFORMIN, metoprolol succinate, pantoprazole, solifenacin, topiramate, REMIFEMIN, ALPRAZolam, and gabapentin.  Meds ordered this encounter  Medications  . albuterol (VENTOLIN HFA) 108 (90 Base) MCG/ACT inhaler    Sig: Inhale 2 puffs into the lungs every 6 (six) hours as needed for wheezing or shortness of breath.    Dispense:  18 Inhaler    Refill:  5  . DISCONTD: amLODipine (NORVASC) 5 MG tablet    Sig: TAKE 1 TABLET (5 MG TOTAL) BY MOUTH DAILY.     Dispense:  90 tablet    Refill:  1  . atorvastatin (LIPITOR) 40 MG tablet    Sig: TAKE 1 TABLET EVERY DAY AT 6PM    Dispense:  90 tablet    Refill:  1  . ezetimibe (  ZETIA) 10 MG tablet    Sig: Take 1 tablet (10 mg total) by mouth daily. For cholesterol    Dispense:  90 tablet    Refill:  1  . fluticasone furoate-vilanterol (BREO ELLIPTA) 100-25 MCG/INH AEPB    Sig: Inhale 1 puff into the lungs daily.    Dispense:  180 each    Refill:  5  . furosemide (LASIX) 20 MG tablet    Sig: Take 1 tablet (20 mg total) by mouth daily.    Dispense:  90 tablet    Refill:  1  . levofloxacin (LEVAQUIN) 500 MG tablet    Sig: Take 1 tablet (500 mg total) by mouth daily. For 10 days    Dispense:  10 tablet    Refill:  0  . metFORMIN (GLUCOPHAGE-XR) 750 MG 24 hr tablet    Sig: Take 1 tablet (750 mg total) by mouth daily with breakfast.    Dispense:  30 tablet    Refill:  5  . metoprolol succinate (TOPROL-XL) 50 MG 24 hr tablet    Sig: Take 1 tablet (50 mg total) by mouth daily. Take with or immediately following a meal.    Dispense:  90 tablet    Refill:  1  . pantoprazole (PROTONIX) 40 MG tablet    Sig: Take 1 tablet (40 mg total) by mouth 2 (two) times daily. For stomach    Dispense:  60 tablet    Refill:  5  . solifenacin (VESICARE) 10 MG tablet    Sig: Take 1 tablet (10 mg total) by mouth daily.    Dispense:  90 tablet    Refill:  0  . topiramate (TOPAMAX) 25 MG tablet    Sig: 1 nightly times 1 week.  Then 2 nightly for 1 week.  Then 3 nightly for 1 week.  Then 4 nightly.    Dispense:  120 tablet    Refill:  0  . REMIFEMIN 20 MG TABS    Sig: Take 1 tablet (20 mg total) by mouth daily.    Dispense:  60 each    Refill:  0  . ALPRAZolam (XANAX) 1 MG tablet    Sig: TAKE 1 TABLET ('1MG'$  TOTAL) 3 (THREE)TIMES DAILY BY MOUTH    Dispense:  30 tablet    Refill:  0    Needs to be seen for more refills  . gabapentin (NEURONTIN) 300 MG capsule    Sig: Take 2 capsules (600 mg total) by mouth 4  (four) times daily.    Dispense:  480 capsule    Refill:  5  . DISCONTD: HYDROcodone-acetaminophen (NORCO) 10-325 MG tablet    Sig: Take 1 tablet by mouth every 6 (six) hours as needed for moderate pain. Reported on 02/07/2016    Dispense:  60 tablet    Refill:  0  . ciprofloxacin (CIPRO) 250 MG tablet    Sig: Take 1 tablet (250 mg total) by mouth 2 (two) times daily.    Dispense:  10 tablet    Refill:  0  . amLODipine (NORVASC) 10 MG tablet    Sig: TAKE 1 TABLET (5 MG TOTAL) BY MOUTH DAILY.    Dispense:  90 tablet    Refill:  1   Handout for weight loss / Carb counting for DM  Follow-up: Return in about 3 months (around 01/22/2019).  [name], M.D.

## 2018-10-22 NOTE — Patient Instructions (Signed)

## 2018-10-22 NOTE — Telephone Encounter (Signed)
Please send a new Ditropan  Script in.  There are no directions , quantity or refills on original.

## 2018-10-22 NOTE — Telephone Encounter (Signed)
I sent in the requested prescription 

## 2018-10-23 LAB — CMP14+EGFR
ALBUMIN: 4 g/dL (ref 3.6–4.8)
ALT: 22 IU/L (ref 0–32)
AST: 18 IU/L (ref 0–40)
Albumin/Globulin Ratio: 1.7 (ref 1.2–2.2)
Alkaline Phosphatase: 125 IU/L — ABNORMAL HIGH (ref 39–117)
BUN / CREAT RATIO: 19 (ref 12–28)
BUN: 19 mg/dL (ref 8–27)
Bilirubin Total: 0.5 mg/dL (ref 0.0–1.2)
CALCIUM: 9.7 mg/dL (ref 8.7–10.3)
CHLORIDE: 101 mmol/L (ref 96–106)
CO2: 24 mmol/L (ref 20–29)
CREATININE: 1 mg/dL (ref 0.57–1.00)
GFR calc Af Amer: 67 mL/min/{1.73_m2} (ref >59)
GFR, EST NON AFRICAN AMERICAN: 58 mL/min/{1.73_m2} — AB (ref >59)
GLOBULIN, TOTAL: 2.3 g/dL (ref 1.5–4.5)
Glucose: 238 mg/dL — ABNORMAL HIGH (ref 65–99)
Potassium: 3.8 mmol/L (ref 3.5–5.2)
SODIUM: 139 mmol/L (ref 134–144)
Total Protein: 6.3 g/dL (ref 6.0–8.5)

## 2018-10-23 LAB — LIPID PANEL
CHOLESTEROL TOTAL: 248 mg/dL — AB (ref 100–199)
Chol/HDL Ratio: 4.7 ratio — ABNORMAL HIGH (ref 0.0–4.4)
HDL: 53 mg/dL (ref >39)
LDL CALC: 164 mg/dL — AB (ref 0–99)
TRIGLYCERIDES: 155 mg/dL — AB (ref 0–149)
VLDL CHOLESTEROL CAL: 31 mg/dL (ref 5–40)

## 2018-10-23 LAB — MICROALBUMIN / CREATININE URINE RATIO
Creatinine, Urine: 338.1 mg/dL
MICROALB/CREAT RATIO: 8.1 mg/g{creat} (ref 0.0–30.0)
Microalbumin, Urine: 27.5 ug/mL

## 2018-10-24 LAB — URINE CULTURE

## 2018-11-13 DIAGNOSIS — H31111 Age-related choroidal atrophy, right eye: Secondary | ICD-10-CM | POA: Diagnosis not present

## 2018-11-13 DIAGNOSIS — H33102 Unspecified retinoschisis, left eye: Secondary | ICD-10-CM | POA: Diagnosis not present

## 2018-11-13 DIAGNOSIS — H35363 Drusen (degenerative) of macula, bilateral: Secondary | ICD-10-CM | POA: Diagnosis not present

## 2018-11-13 DIAGNOSIS — G894 Chronic pain syndrome: Secondary | ICD-10-CM | POA: Diagnosis not present

## 2018-11-13 DIAGNOSIS — H35422 Microcystoid degeneration of retina, left eye: Secondary | ICD-10-CM | POA: Diagnosis not present

## 2018-11-13 DIAGNOSIS — Z5181 Encounter for therapeutic drug level monitoring: Secondary | ICD-10-CM | POA: Diagnosis not present

## 2018-11-13 DIAGNOSIS — Z79899 Other long term (current) drug therapy: Secondary | ICD-10-CM | POA: Diagnosis not present

## 2018-11-13 DIAGNOSIS — M4726 Other spondylosis with radiculopathy, lumbar region: Secondary | ICD-10-CM | POA: Diagnosis not present

## 2018-11-17 ENCOUNTER — Ambulatory Visit (INDEPENDENT_AMBULATORY_CARE_PROVIDER_SITE_OTHER): Payer: Medicare HMO | Admitting: Family Medicine

## 2018-11-17 ENCOUNTER — Encounter: Payer: Self-pay | Admitting: Family Medicine

## 2018-11-17 VITALS — BP 143/86 | HR 79 | Temp 98.2°F | Ht 62.0 in | Wt 179.0 lb

## 2018-11-17 DIAGNOSIS — R52 Pain, unspecified: Secondary | ICD-10-CM | POA: Diagnosis not present

## 2018-11-17 DIAGNOSIS — J101 Influenza due to other identified influenza virus with other respiratory manifestations: Secondary | ICD-10-CM

## 2018-11-17 LAB — VERITOR FLU A/B WAIVED
Influenza A: NEGATIVE
Influenza B: POSITIVE — AB

## 2018-11-17 MED ORDER — OSELTAMIVIR PHOSPHATE 75 MG PO CAPS: 75.0000 mg | ORAL_CAPSULE | Freq: Two times a day (BID) | ORAL | 0 refills | Status: AC

## 2018-11-17 NOTE — Progress Notes (Signed)
Subjective:    Patient ID: Yvonne [name], female    DOB: 03/23/1951, 67 y.o.   MRN: [id number]  Chief Complaint:  Fever, body aches, cough, chest congestion (began on Saturday)   HPI: Yvonne [name] is a 67 y.o. female presenting on 11/17/2018 for Fever, body aches, cough, chest congestion (began on Saturday)  Pt presents today with complaints of body aches, fever, chills, cough, congestion, and ear pain. Pt states this started on Saturday and is not getting better. States she has fatigue and malaise. She has not tried anything over the counter for her symptoms. She denies any recent sick exposures. States she has had her flu vaccination this year.   Relevant past medical, surgical, family, and social history reviewed and updated as indicated.  Allergies and medications reviewed and updated.   Past Medical History:  Diagnosis Date  . Anxiety   . Arthritis    left hand  . Asthma    daily and prn inhalers  . Chronic cough   . Chronic otitis media 12/2017  . Full dentures   . GERD (gastroesophageal reflux disease)   . History of stroke 09/2017   weakness right hand, numbness right side face  . Hyperlipidemia   . Hypertension    states under control with meds., has been on med. x a long time, per pt.  . Non-insulin dependent type 2 diabetes mellitus ([place])   . Overactive bladder   . Recurrent acute suppurative otitis media without spontaneous rupture of left tympanic membrane 02/19/2018  . Transient cerebral ischemia     Past Surgical History:  Procedure Laterality Date  . ABDOMINAL HYSTERECTOMY     complete  . CATARACT EXTRACTION W/ INTRAOCULAR LENS IMPLANT Right   . CHOLECYSTECTOMY    . COLONOSCOPY N/A 08/17/2015   Procedure: COLONOSCOPY;  Surgeon: Rogene Houston, MD;  Location: [facility];  Service: Endoscopy;  Laterality: N/A;  200 - moved to 7:30 - Ann notified pt  . GAS INSERTION Right    x 2 - eye  . LACRIMAL DUCT EXPLORATION Bilateral    removal of tear ducts    . MYRINGOTOMY WITH TUBE PLACEMENT Bilateral 01/28/2018   Procedure: BILATERAL MYRINGOTOMY WITH TUBE PLACEMENT;  Surgeon: Leta Baptist, MD;  Location: [facility];  Service: ENT;  Laterality: Bilateral;  . VITRECTOMY Left 09/14/2015    Social History   Socioeconomic History  . Marital status: Married    Spouse name: Not on file  . Number of children: 3  . Years of education: Not on file  . Highest education level: Not on file  Occupational History  . Occupation: unemployed  Social Needs  . Financial resource strain: Not on file  . Food insecurity:    Worry: Not on file    Inability: Not on file  . Transportation needs:    Medical: Not on file    Non-medical: Not on file  Tobacco Use  . Smoking status: Never Smoker  . Smokeless tobacco: Never Used  Substance and Sexual Activity  . Alcohol use: No  . Drug use: No  . Sexual activity: Yes  Lifestyle  . Physical activity:    Days per week: Not on file    Minutes per session: Not on file  . Stress: Not on file  Relationships  . Social connections:    Talks on phone: Not on file    Gets together: Not on file    Attends religious service: Not on  file    Active member of club or organization: Not on file    Attends meetings of clubs or organizations: Not on file    Relationship status: Not on file  . Intimate partner violence:    Fear of current or ex partner: Not on file    Emotionally abused: Not on file    Physically abused: Not on file    Forced sexual activity: Not on file  Other Topics Concern  . Not on file  Social History Narrative   Lives at home home with husband with son, daughter in law and 3 children.    Disabled.    Outpatient Encounter Medications as of 11/17/2018  Medication Sig  . albuterol (VENTOLIN HFA) 108 (90 Base) MCG/ACT inhaler Inhale 2 puffs into the lungs every 6 (six) hours as needed for wheezing or shortness of breath.  . ALPRAZolam (XANAX) 1 MG tablet TAKE 1 TABLET ('1MG'$  TOTAL) 3  (THREE)TIMES DAILY BY MOUTH  . amLODipine (NORVASC) 10 MG tablet TAKE 1 TABLET (5 MG TOTAL) BY MOUTH DAILY.  [name] Kitchen aspirin EC 81 MG tablet Take 81 mg by mouth daily.  [name] Kitchen atorvastatin (LIPITOR) 40 MG tablet TAKE 1 TABLET EVERY DAY AT 6PM  . blood glucose meter kit and supplies Use as directed  . ezetimibe (ZETIA) 10 MG tablet Take 1 tablet (10 mg total) by mouth daily. For cholesterol  . fexofenadine (ALLEGRA ALLERGY) 180 MG tablet Take 1 tablet (180 mg total) by mouth daily.  . fluticasone furoate-vilanterol (BREO ELLIPTA) 100-25 MCG/INH AEPB Inhale 1 puff into the lungs daily.  . furosemide (LASIX) 20 MG tablet Take 1 tablet (20 mg total) by mouth daily.  [name] Kitchen glucose blood test strip Test once daily. (Patient taking differently: Test up to 4 times daily)  . LANCETS ULTRA THIN MISC Test once daily.  . metFORMIN (GLUCOPHAGE-XR) 750 MG 24 hr tablet Take 1 tablet (750 mg total) by mouth daily with breakfast.  . metoprolol succinate (TOPROL-XL) 50 MG 24 hr tablet Take 1 tablet (50 mg total) by mouth daily. Take with or immediately following a meal.  . oxybutynin (DITROPAN XL) 15 MG 24 hr tablet TAKE 1 TABLET (15 MG TOTAL) BY MOUTH 2 (TWO) TIMES DAILY.  . pantoprazole (PROTONIX) 40 MG tablet Take 1 tablet (40 mg total) by mouth 2 (two) times daily. For stomach  . REMIFEMIN 20 MG TABS Take 1 tablet (20 mg total) by mouth daily.  [name] Kitchen topiramate (TOPAMAX) 25 MG tablet 1 nightly times 1 week.  Then 2 nightly for 1 week.  Then 3 nightly for 1 week.  Then 4 nightly.  [name] Kitchen oseltamivir (TAMIFLU) 75 MG capsule Take 1 capsule (75 mg total) by mouth 2 (two) times daily for 5 days.  . [DISCONTINUED] ciprofloxacin (CIPRO) 250 MG tablet Take 1 tablet (250 mg total) by mouth 2 (two) times daily.  . [DISCONTINUED] gabapentin (NEURONTIN) 300 MG capsule Take 2 capsules (600 mg total) by mouth 4 (four) times daily.  . [DISCONTINUED] levofloxacin (LEVAQUIN) 500 MG tablet Take 1 tablet (500 mg total) by mouth daily. For 10 days    No facility-administered encounter medications on file as of 11/17/2018.     Allergies  Allergen Reactions  . Penicillins Hives    Has patient had a PCN reaction causing immediate rash, facial/tongue/throat swelling, SOB or lightheadedness with hypotension: Yes Has patient had a PCN reaction causing severe rash involving mucus membranes or skin necrosis: Unk Has patient had a PCN reaction that required hospitalization: No  Has patient had a PCN reaction occurring within the last 10 years: No If all of the above answers are "NO", then may proceed with Cephalosporin use.   . Gabapentin Other (See Comments)    Causes a lot of lethargy at a higher dose  . Lisinopril Cough  . Soap Rash    DIAL soap    Review of Systems  Constitutional: Positive for activity change, appetite change, chills, fatigue and fever.  HENT: Positive for congestion, ear pain, rhinorrhea and sore throat.   Respiratory: Positive for cough. Negative for chest tightness and shortness of breath.   Cardiovascular: Negative for chest pain, palpitations and leg swelling.  Gastrointestinal: Negative for abdominal pain, constipation, diarrhea, nausea and vomiting.  Musculoskeletal: Positive for arthralgias and myalgias.  Skin: Negative for color change.  Neurological: Positive for headaches. Negative for dizziness and weakness.  Psychiatric/Behavioral: Negative for confusion.  All other systems reviewed and are negative.       Objective:    BP (!) 143/86   Pulse 79   Temp 98.2 F (36.8 C) (Oral)   Ht '5\' 2"'$  (1.575 m)   Wt 179 lb (81.2 kg)   BMI 32.74 kg/m    Wt Readings from Last 3 Encounters:  11/17/18 179 lb (81.2 kg)  10/22/18 184 lb 12.8 oz (83.8 kg)  09/30/18 184 lb 9.6 oz (83.7 kg)    Physical Exam Vitals signs and nursing note reviewed.  Constitutional:      General: She is in acute distress (mild).     Appearance: Normal appearance. She is well-developed.  HENT:     Head: Normocephalic and  atraumatic.     Right Ear: Hearing, ear canal and external ear normal. A PE tube is present. Tympanic membrane is not erythematous.     Left Ear: Hearing, ear canal and external ear normal. A PE tube is present. Tympanic membrane is not erythematous.     Nose: Congestion and rhinorrhea present.     Right Sinus: No maxillary sinus tenderness or frontal sinus tenderness.     Left Sinus: No maxillary sinus tenderness or frontal sinus tenderness.     Mouth/Throat:     Lips: Pink.     Mouth: Mucous membranes are moist.     Pharynx: Posterior oropharyngeal erythema present. No pharyngeal swelling, oropharyngeal exudate or uvula swelling.     Tonsils: No tonsillar exudate or tonsillar abscesses.  Eyes:     General: Lids are normal.     Conjunctiva/sclera: Conjunctivae normal.     Pupils: Pupils are equal, round, and reactive to light.  Neck:     Musculoskeletal: Normal range of motion and neck supple. No neck rigidity or muscular tenderness.     Vascular: No carotid bruit.     Trachea: Trachea and phonation normal.  Cardiovascular:     Rate and Rhythm: Normal rate and regular rhythm.     Heart sounds: Normal heart sounds. No murmur. No friction rub. No gallop.   Pulmonary:     Effort: Pulmonary effort is normal. No respiratory distress.     Breath sounds: Normal breath sounds.  Lymphadenopathy:     Cervical: No cervical adenopathy.  Skin:    General: Skin is warm and dry.     Capillary Refill: Capillary refill takes less than 2 seconds.  Neurological:     Mental Status: She is alert and oriented to person, place, and time.  Psychiatric:        Mood and Affect: Mood normal.  Behavior: Behavior normal. Behavior is cooperative.        Thought Content: Thought content normal.        Judgment: Judgment normal.     Results for orders placed or performed in visit on 10/22/18  Urine Culture  Result Value Ref Range   Urine Culture, Routine Final report (A)    Organism ID, Bacteria  Escherichia coli (A)    Antimicrobial Susceptibility Comment   CMP14+EGFR  Result Value Ref Range   Glucose 238 (H) 65 - 99 mg/dL   BUN 19 8 - 27 mg/dL   Creatinine, Ser 1.00 0.57 - 1.00 mg/dL   GFR calc non Af Amer 58 (L) >59 mL/min/1.73   GFR calc Af Amer 67 >59 mL/min/1.73   BUN/Creatinine Ratio 19 12 - 28   Sodium 139 134 - 144 mmol/L   Potassium 3.8 3.5 - 5.2 mmol/L   Chloride 101 96 - 106 mmol/L   CO2 24 20 - 29 mmol/L   Calcium 9.7 8.7 - 10.3 mg/dL   Total Protein 6.3 6.0 - 8.5 g/dL   Albumin 4.0 3.6 - 4.8 g/dL   Globulin, Total 2.3 1.5 - 4.5 g/dL   Albumin/Globulin Ratio 1.7 1.2 - 2.2   Bilirubin Total 0.5 0.0 - 1.2 mg/dL   Alkaline Phosphatase 125 (H) 39 - 117 IU/L   AST 18 0 - 40 IU/L   ALT 22 0 - 32 IU/L  Lipid panel  Result Value Ref Range   Cholesterol, Total 248 (H) 100 - 199 mg/dL   Triglycerides 155 (H) 0 - 149 mg/dL   HDL 53 >39 mg/dL   VLDL Cholesterol Cal 31 5 - 40 mg/dL   LDL Calculated 164 (H) 0 - 99 mg/dL   Chol/HDL Ratio 4.7 (H) 0.0 - 4.4 ratio  Urinalysis  Result Value Ref Range   Specific Gravity, UA 1.025 1.005 - 1.030   pH, UA 5.5 5.0 - 7.5   Color, UA Amber (A) Yellow   Appearance Ur Cloudy (A) Clear   Leukocytes, UA 1+ (A) Negative   Protein, UA Negative Negative/Trace   Glucose, UA Negative Negative   Ketones, UA Negative Negative   RBC, UA 1+ (A) Negative   Bilirubin, UA Negative Negative   Urobilinogen, Ur 0.2 0.2 - 1.0 mg/dL   Nitrite, UA Positive (A) Negative  Bayer DCA Hb A1c Waived  Result Value Ref Range   HB A1C (BAYER DCA - WAIVED) 8.3 (H) <7.0 %  Microalbumin / creatinine urine ratio  Result Value Ref Range   Creatinine, Urine 338.1 Not Estab. mg/dL   Microalbumin, Urine 27.5 Not Estab. ug/mL   Microalb/Creat Ratio 8.1 0.0 - 30.0 mg/g creat    Influenza screening positive for B, negative for A   Pertinent labs & imaging results that were available during my care of the patient were reviewed by me and considered in my  medical decision making.  Assessment & Plan:  Heidie was seen today for fever, body aches, cough, chest congestion.  Diagnoses and all orders for this visit:  Body aches -     Veritor Flu A/B Waived  Influenza due to influenza virus, type B Screening positive for B. Increase fluid intake. Rest. Tylenol as needed for fever and pain control. Infection control discussed. Medications as prescribed.  -     oseltamivir (TAMIFLU) 75 MG capsule; Take 1 capsule (75 mg total) by mouth 2 (two) times daily for 5 days.    Continue all other maintenance medications.  Follow up plan: Return if symptoms worsen or fail to improve.  Educational handout given for influenza  The above assessment and management plan was discussed with the patient. The patient verbalized understanding of and has agreed to the management plan. Patient is aware to call the clinic if symptoms persist or worsen. Patient is aware when to return to the clinic for a follow-up visit. Patient educated on when it is appropriate to go to the emergency department.   Monia Pouch, FNP-C [facility] [phone]

## 2018-11-17 NOTE — Patient Instructions (Signed)

## 2018-11-20 DIAGNOSIS — G4733 Obstructive sleep apnea (adult) (pediatric): Secondary | ICD-10-CM | POA: Diagnosis not present

## 2018-11-21 ENCOUNTER — Ambulatory Visit (INDEPENDENT_AMBULATORY_CARE_PROVIDER_SITE_OTHER): Payer: Medicare HMO | Admitting: Pediatrics

## 2018-11-21 ENCOUNTER — Encounter: Payer: Self-pay | Admitting: Pediatrics

## 2018-11-21 ENCOUNTER — Ambulatory Visit: Payer: Medicare HMO | Admitting: Family Medicine

## 2018-11-21 VITALS — BP 118/70 | HR 77 | Temp 98.7°F | Resp 20 | Ht 62.0 in | Wt 177.4 lb

## 2018-11-21 DIAGNOSIS — H109 Unspecified conjunctivitis: Secondary | ICD-10-CM

## 2018-11-21 DIAGNOSIS — B9689 Other specified bacterial agents as the cause of diseases classified elsewhere: Secondary | ICD-10-CM

## 2018-11-21 DIAGNOSIS — J101 Influenza due to other identified influenza virus with other respiratory manifestations: Secondary | ICD-10-CM | POA: Diagnosis not present

## 2018-11-21 DIAGNOSIS — R062 Wheezing: Secondary | ICD-10-CM | POA: Diagnosis not present

## 2018-11-21 MED ORDER — PREDNISONE 20 MG PO TABS: ORAL_TABLET | ORAL | 0 refills | Status: DC

## 2018-11-21 MED ORDER — AZITHROMYCIN 250 MG PO TABS: ORAL_TABLET | ORAL | 0 refills | Status: DC

## 2018-11-21 MED ORDER — POLYMYXIN B-TRIMETHOPRIM 10000-0.1 UNIT/ML-% OP SOLN: 1.0000 [drp] | OPHTHALMIC | 0 refills | Status: DC

## 2018-11-21 NOTE — Progress Notes (Signed)
  Subjective:   Patient ID: Yvonne [name], female    DOB: 1951/06/18, 67 y.o.   MRN: [id number] CC: Influenza  HPI: Yvonne [name] is a 67 y.o. female   Dx with flu B 5 days ago. Taking tamiflu. Has been feeling worse. Continuing to cough, has daily fevers. Taking tylenol regularly. Eyes started having goopiness present throughout the day in the last 2 days, L>R.  Still feeling achy, appetite down.  No vomiting or diarrhea.  Cough bothering her a lot.  Relevant past medical, surgical, family and social history reviewed. Allergies and medications reviewed and updated. Social History   Tobacco Use  Smoking Status Never Smoker  Smokeless Tobacco Never Used   ROS: Per HPI   Objective:    BP 118/70   Pulse 77   Temp 98.7 F (37.1 C) (Oral)   Resp 20   Ht 5\' 2"  (1.575 m)   Wt 177 lb 6.4 oz (80.5 kg)   SpO2 96%   BMI 32.45 kg/m   Wt Readings from Last 3 Encounters:  11/21/18 177 lb 6.4 oz (80.5 kg)  11/17/18 179 lb (81.2 kg)  10/22/18 184 lb 12.8 oz (83.8 kg)    Gen: NAD, alert, tired appearing, NCAT EYES: EOMI, + conjunctival injection bilaterally, or no icterus, thick green discharge present medial epicanthal fold left eye ENT:  TMs dull gray b/l, OP without erythema LYMPH: no cervical LAD CV: NRRR, normal S1/S2, no murmur, distal pulses 2+ b/l  Resp: Moving air fair, prolonged expiratory phase, scattered wheeze present with cough, comfortable WOB Abd: +BS, soft, NTND. no guarding or organomegaly Ext: No edema, warm Neuro: Alert and oriented, strength equal b/l UE and LE, coordination grossly normal  Assessment & Plan:  Kamari was seen today for influenza.  Diagnoses and all orders for this visit:  Influenza due to influenza virus, type B Most of symptoms related to ongoing influenza infection.  Return precautions discussed.  Symptomatic care discussed.  Continue Tamiflu until finished.  Given scattered wheeze, new bacterial conjunctivitis, will treat with  below.  Wheezing -     predniSONE (DELTASONE) 20 MG tablet; 2 po at same time daily for 3 days -     azithromycin (ZITHROMAX) 250 MG tablet; Take 2 the first day and then one each day after.  Bacterial conjunctivitis of both eyes -     trimethoprim-polymyxin b (POLYTRIM) ophthalmic solution; Place 1 drop into both eyes every 4 (four) hours. While awake  Follow up plan: Return in about 3 days (around 11/24/2018) for if not improving. Assunta Found, MD [place]

## 2018-11-22 ENCOUNTER — Encounter: Payer: Self-pay | Admitting: Pediatrics

## 2018-11-22 ENCOUNTER — Telehealth: Payer: Self-pay | Admitting: Family Medicine

## 2018-11-22 DIAGNOSIS — R05 Cough: Secondary | ICD-10-CM

## 2018-11-22 DIAGNOSIS — R059 Cough, unspecified: Secondary | ICD-10-CM

## 2018-11-22 MED ORDER — GUAIFENESIN-CODEINE 100-10 MG/5ML PO SOLN: 5.0000 mL | Freq: Four times a day (QID) | ORAL | 0 refills | Status: DC | PRN

## 2018-11-22 NOTE — Telephone Encounter (Signed)
Please review and advise.

## 2018-11-22 NOTE — Telephone Encounter (Signed)
Returned call, patient says she is feeling slightly better today than she did yesterday in office visit.  Still coughing a lot.  Cough is been productive of sputum, sometimes blood-tinged starting this morning.  Has been on cough medicine with codeine in the past, has had some relief with it.  Will send in now.  Discussed if having cough productive of any frank red blood needs to be seen immediately.  Do not drive with the codeine or operate heavy machinery.  Can cause drowsiness.  Okay to take every 6-8 hours.  Any worsening symptoms needs to be seen.

## 2018-12-02 ENCOUNTER — Ambulatory Visit: Payer: Medicare HMO

## 2018-12-02 ENCOUNTER — Encounter: Payer: Self-pay | Admitting: *Deleted

## 2018-12-02 ENCOUNTER — Ambulatory Visit (INDEPENDENT_AMBULATORY_CARE_PROVIDER_SITE_OTHER): Payer: Medicare HMO | Admitting: *Deleted

## 2018-12-02 ENCOUNTER — Ambulatory Visit: Payer: Self-pay | Admitting: Licensed Clinical Social Worker

## 2018-12-02 VITALS — BP 167/86 | HR 80 | Ht 62.0 in | Wt 179.0 lb

## 2018-12-02 DIAGNOSIS — Z0000 Encounter for general adult medical examination without abnormal findings: Secondary | ICD-10-CM

## 2018-12-02 DIAGNOSIS — Z1211 Encounter for screening for malignant neoplasm of colon: Secondary | ICD-10-CM

## 2018-12-02 DIAGNOSIS — F1320 Sedative, hypnotic or anxiolytic dependence, uncomplicated: Secondary | ICD-10-CM

## 2018-12-02 DIAGNOSIS — F411 Generalized anxiety disorder: Secondary | ICD-10-CM

## 2018-12-02 NOTE — Patient Instructions (Signed)
Please continue to work on your goals of exercising 3-4 times per week for 30 minutes each session, and avoiding drinks with sugar in them.   Please review the paperwork on Advance Directives.  If you complete it, please bring a copy to our office to be filed in your medical record.  Please follow up with Dr. [name] as scheduled.    Thank you for coming in for your Annual Wellness Visit today!!   Preventive Care 65 Years and Older, Female Preventive care refers to lifestyle choices and visits with your health care provider that can promote health and wellness. What does preventive care include?  A yearly physical exam. This is also called an annual well check.  Dental exams once or twice a year.  Routine eye exams. Ask your health care provider how often you should have your eyes checked.  Personal lifestyle choices, including: ? Daily care of your teeth and gums. ? Regular physical activity. ? Eating a healthy diet. ? Avoiding tobacco and drug use. ? Limiting alcohol use. ? Practicing safe sex. ? Taking low-dose aspirin every day. ? Taking vitamin and mineral supplements as recommended by your health care provider. What happens during an annual well check? The services and screenings done by your health care provider during your annual well check will depend on your age, overall health, lifestyle risk factors, and family history of disease. Counseling Your health care provider may ask you questions about your:  Alcohol use.  Tobacco use.  Drug use.  Emotional well-being.  Home and relationship well-being.  Sexual activity.  Eating habits.  History of falls.  Memory and ability to understand (cognition).  Work and work Statistician.  Reproductive health.  Screening You may have the following tests or measurements:  Height, weight, and BMI.  Blood pressure.  Lipid and cholesterol levels. These may be checked every 5 years, or more frequently if you are over  67 years old.  Skin check.  Lung cancer screening. You may have this screening every year starting at age 33 if you have a 30-pack-year history of smoking and currently smoke or have quit within the past 15 years.  Colorectal cancer screening. All adults should have this screening starting at age 31 and continuing until age 78. You will have tests every 1-10 years, depending on your results and the type of screening test. People at increased risk should start screening at an earlier age. Screening tests may include: ? Guaiac-based fecal occult blood testing. ? Fecal immunochemical test (FIT). ? Stool DNA test. ? Virtual colonoscopy. ? Sigmoidoscopy. During this test, a flexible tube with a tiny camera (sigmoidoscope) is used to examine your rectum and lower colon. The sigmoidoscope is inserted through your anus into your rectum and lower colon. ? Colonoscopy. During this test, a long, thin, flexible tube with a tiny camera (colonoscope) is used to examine your entire colon and rectum.  Hepatitis C blood test.  Hepatitis B blood test.  Sexually transmitted disease (STD) testing.  Diabetes screening. This is done by checking your blood sugar (glucose) after you have not eaten for a while (fasting). You may have this done every 1-3 years.  Bone density scan. This is done to screen for osteoporosis. You may have this done starting at age 2.  Mammogram. This may be done every 1-2 years. Talk to your health care provider about how often you should have regular mammograms. Talk with your health care provider about your test results, treatment options, and if  necessary, the need for more tests. Vaccines Your health care provider may recommend certain vaccines, such as:  Influenza vaccine. This is recommended every year.  Tetanus, diphtheria, and acellular pertussis (Tdap, Td) vaccine. You may need a Td booster every 10 years.  Varicella vaccine. You may need this if you have not been  vaccinated.  Zoster vaccine. You may need this after age 65.  Measles, mumps, and rubella (MMR) vaccine. You may need at least one dose of MMR if you were born in 1957 or later. You may also need a second dose.  Pneumococcal 13-valent conjugate (PCV13) vaccine. One dose is recommended after age 63.  Pneumococcal polysaccharide (PPSV23) vaccine. One dose is recommended after age 15.  Meningococcal vaccine. You may need this if you have certain conditions.  Hepatitis A vaccine. You may need this if you have certain conditions or if you travel or work in places where you may be exposed to hepatitis A.  Hepatitis B vaccine. You may need this if you have certain conditions or if you travel or work in places where you may be exposed to hepatitis B.  Haemophilus influenzae type b (Hib) vaccine. You may need this if you have certain conditions. Talk to your health care provider about which screenings and vaccines you need and how often you need them. This information is not intended to replace advice given to you by your health care provider. Make sure you discuss any questions you have with your health care provider. Document Released: 12/09/2015 Document Revised: 01/02/2018 Document Reviewed: 09/13/2015 Elsevier Interactive Patient Education  2019 [employer].   Diabetes Mellitus and Nutrition, Adult When you have diabetes (diabetes mellitus), it is very important to have healthy eating habits because your blood sugar (glucose) levels are greatly affected by what you eat and drink. Eating healthy foods in the appropriate amounts, at about the same times every day, can help you:  Control your blood glucose.  Lower your risk of heart disease.  Improve your blood pressure.  Reach or maintain a healthy weight. Every person with diabetes is different, and each person has different needs for a meal plan. Your health care provider may recommend that you work with a diet and nutrition specialist  (dietitian) to make a meal plan that is best for you. Your meal plan may vary depending on factors such as:  The calories you need.  The medicines you take.  Your weight.  Your blood glucose, blood pressure, and cholesterol levels.  Your activity level.  Other health conditions you have, such as heart or kidney disease. How do carbohydrates affect me? Carbohydrates, also called carbs, affect your blood glucose level more than any other type of food. Eating carbs naturally raises the amount of glucose in your blood. Carb counting is a method for keeping track of how many carbs you eat. Counting carbs is important to keep your blood glucose at a healthy level, especially if you use insulin or take certain oral diabetes medicines. It is important to know how many carbs you can safely have in each meal. This is different for every person. Your dietitian can help you calculate how many carbs you should have at each meal and for each snack. Foods that contain carbs include:  Bread, cereal, rice, pasta, and crackers.  Potatoes and corn.  Peas, beans, and lentils.  Milk and yogurt.  Fruit and juice.  Desserts, such as cakes, cookies, ice cream, and candy. How does alcohol affect me? Alcohol can cause  a sudden decrease in blood glucose (hypoglycemia), especially if you use insulin or take certain oral diabetes medicines. Hypoglycemia can be a life-threatening condition. Symptoms of hypoglycemia (sleepiness, dizziness, and confusion) are similar to symptoms of having too much alcohol. If your health care provider says that alcohol is safe for you, follow these guidelines:  Limit alcohol intake to no more than 1 drink per day for nonpregnant women and 2 drinks per day for men. One drink equals 12 oz of beer, 5 oz of wine, or 1 oz of hard liquor.  Do not drink on an empty stomach.  Keep yourself hydrated with water, diet soda, or unsweetened iced tea.  Keep in mind that regular soda,  juice, and other mixers may contain a lot of sugar and must be counted as carbs. What are tips for following this plan?  Reading food labels  Start by checking the serving size on the "Nutrition Facts" label of packaged foods and drinks. The amount of calories, carbs, fats, and other nutrients listed on the label is based on one serving of the item. Many items contain more than one serving per package.  Check the total grams (g) of carbs in one serving. You can calculate the number of servings of carbs in one serving by dividing the total carbs by 15. For example, if a food has 30 g of total carbs, it would be equal to 2 servings of carbs.  Check the number of grams (g) of saturated and trans fats in one serving. Choose foods that have low or no amount of these fats.  Check the number of milligrams (mg) of salt (sodium) in one serving. Most people should limit total sodium intake to less than 2,300 mg per day.  Always check the nutrition information of foods labeled as "low-fat" or "nonfat". These foods may be higher in added sugar or refined carbs and should be avoided.  Talk to your dietitian to identify your daily goals for nutrients listed on the label. Shopping  Avoid buying canned, premade, or processed foods. These foods tend to be high in fat, sodium, and added sugar.  Shop around the outside edge of the grocery store. This includes fresh fruits and vegetables, bulk grains, fresh meats, and fresh dairy. Cooking  Use low-heat cooking methods, such as baking, instead of high-heat cooking methods like deep frying.  Cook using healthy oils, such as olive, canola, or sunflower oil.  Avoid cooking with butter, cream, or high-fat meats. Meal planning  Eat meals and snacks regularly, preferably at the same times every day. Avoid going long periods of time without eating.  Eat foods high in fiber, such as fresh fruits, vegetables, beans, and whole grains. Talk to your dietitian about  how many servings of carbs you can eat at each meal.  Eat 4-6 ounces (oz) of lean protein each day, such as lean meat, chicken, fish, eggs, or tofu. One oz of lean protein is equal to: ? 1 oz of meat, chicken, or fish. ? 1 egg. ?  cup of tofu.  Eat some foods each day that contain healthy fats, such as avocado, nuts, seeds, and fish. Lifestyle  Check your blood glucose regularly.  Exercise regularly as told by your health care provider. This may include: ? 150 minutes of moderate-intensity or vigorous-intensity exercise each week. This could be brisk walking, biking, or water aerobics. ? Stretching and doing strength exercises, such as yoga or weightlifting, at least 2 times a week.  Take medicines as  told by your health care provider.  Do not use any products that contain nicotine or tobacco, such as cigarettes and e-cigarettes. If you need help quitting, ask your health care provider.  Work with a Social worker or diabetes educator to identify strategies to manage stress and any emotional and social challenges. Questions to ask a health care provider  Do I need to meet with a diabetes educator?  Do I need to meet with a dietitian?  What number can I call if I have questions?  When are the best times to check my blood glucose? Where to find more information:  American Diabetes Association: diabetes.org  Academy of Nutrition and Dietetics: www.eatright.[employer] of Diabetes and Digestive and Kidney Diseases (NIH): [employer] Summary  A healthy meal plan will help you control your blood glucose and maintain a healthy lifestyle.  Working with a diet and nutrition specialist (dietitian) can help you make a meal plan that is best for you.  Keep in mind that carbohydrates (carbs) and alcohol have immediate effects on your blood glucose levels. It is important to count carbs and to use alcohol carefully. This information is not intended to replace advice given  to you by your health care provider. Make sure you discuss any questions you have with your health care provider. Document Released: 08/09/2005 Document Revised: 06/12/2017 Document Reviewed: 12/17/2016 Elsevier Interactive Patient Education  2019 [employer].

## 2018-12-02 NOTE — Patient Instructions (Signed)
Ms. Zertuche was given information about Chronic Care Management services today including:  1. CCM service includes personalized support from designated clinical staff supervised by her physician, including individualized plan of care and coordination with other care providers 2. 24/7 contact phone numbers for assistance for urgent and routine care needs. 3. Service will only be billed when office clinical staff spend 20 minutes or more in a month to coordinate care. 4. Only one practitioner may furnish and bill the service in a calendar month. 5. The patient may stop CCM services at any time (effective at the end of the month) by phone call to the office staff. 6. The patient will be responsible for cost sharing (co-pay) of up to 20% of the service fee (after annual deductible is met).  Patient did not agree to services but is considering CCM support.   [name].Hutch Rhett MSW, [name] Licensed Clinical Social Worker [facility] Care Management [phone]

## 2018-12-02 NOTE — Chronic Care Management (AMB) (Signed)
  Chronic Care Management   Note  12/02/2018 Name: Yvonne [name] MRN: [id number] DOB: 04/22/1951   Telephone outreach today to [name] in response to a referral from Nolberto Hanlon, RN on behalf of Dr. [name] for psychosocial support.  [name] was given information about Chronic Care Management services today including:  1. CCM service includes personalized support from designated clinical staff supervised by her physician, including individualized plan of care and coordination with other care providers 2. 24/7 contact phone numbers for assistance for urgent and routine care needs. 3. Service will only be billed when office clinical staff spend 20 minutes or more in a month to coordinate care. 4. Only one practitioner may furnish and bill the service in a calendar month. 5. The patient may stop CCM services at any time (effective at the end of the month) by phone call to the office staff. 6. The patient will be responsible for cost sharing (co-pay) of up to 20% of the service fee (after annual deductible is met).  Patient did not agree to enrollment but would like to take time to consider CCM services.   Plan:  [name] to call client in 2 weeks to further discuss [name].  [name].Blondina Coderre MSW, [name] Licensed Clinical Social Worker [facility] Care Management [phone] .

## 2018-12-02 NOTE — Progress Notes (Addendum)
Subjective:   Yvonne [name] is a 68 y.o. female who presents for Medicare Annual (Subsequent) preventive examination.  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  She enjoys spending time at her sister's house with her dog and volunteering at her grandchildren's school.  Yvonne [name] states she is under a significant amount of stress due to a recent illness her husband experienced, and having several family members live with her.  She states her son, his wife, and 3 children, and grandson, his girlfriend and child all live with her and her husband.  Discussed CCM social work counseling that is now available at our office.  Yvonne [name] expressed interest, and referral was placed today.  Yvonne [name] states she has 3 children, 8 grandchildren, 9 great grandchildren and 4 dogs.  She has had 2 ER visits in the past year for weakness and near syncope. She had one surgery for bilateral myringotomy with tube placement due to chronic otitis media.  She has had no hospitalizations in the past year.  She feels her health was worse in the last year than it had been previously due to illnesses that she experienced.   Review of Systems:    All systems negative today  Cardiac Risk Factors include: advanced age (>68mn, >>33women);diabetes mellitus;dyslipidemia;hypertension;sedentary lifestyle     Objective:     Vitals: BP (!) 167/86   Pulse 80   Ht '5\' 2"'$  (1.575 m)   Wt 179 lb (81.2 kg)   BMI 32.74 kg/m   Body mass index is 32.74 kg/m.  Advanced Directives 12/02/2018 06/06/2018 01/28/2018 01/23/2018 06/06/2017 06/06/2017 05/16/2017  Does Patient Have a Medical Advance Directive? No No No No - No No  Would patient like information on creating a medical advance directive? Yes (MAU/Ambulatory/Procedural Areas - Information given) - No - Patient declined No - Patient declined Yes (ED - Information included in AVS) No - Patient declined No - Patient declined    Tobacco Social History    Tobacco Use  Smoking Status Never Smoker  Smokeless Tobacco Never Used     Counseling given: No   Clinical Intake:     Pain Score: 0-No pain                 Past Medical History:  Diagnosis Date  . Anxiety   . Arthritis    left hand  . Asthma    daily and prn inhalers  . Chronic cough   . Chronic otitis media 12/2017  . Full dentures   . GERD (gastroesophageal reflux disease)   . History of stroke 09/2017   weakness right hand, numbness right side face  . Hyperlipidemia   . Hypertension    states under control with meds., has been on med. x a long time, per pt.  . Non-insulin dependent type 2 diabetes mellitus ([place]   . Overactive bladder   . Recurrent acute suppurative otitis media without spontaneous rupture of left tympanic membrane 02/19/2018  . Transient cerebral ischemia    Past Surgical History:  Procedure Laterality Date  . ABDOMINAL HYSTERECTOMY     complete  . CATARACT EXTRACTION W/ INTRAOCULAR LENS IMPLANT Right   . CHOLECYSTECTOMY    . COLONOSCOPY N/A 08/17/2015   Procedure: COLONOSCOPY;  Surgeon: NRogene Houston MD;  [facility];  Service: Endoscopy;  Laterality: N/A;  200 - moved to 7:30 - Ann notified pt  . GAS INSERTION Right    x 2 -  eye  . LACRIMAL DUCT EXPLORATION Bilateral    removal of tear ducts  . MYRINGOTOMY WITH TUBE PLACEMENT Bilateral 01/28/2018   Procedure: BILATERAL MYRINGOTOMY WITH TUBE PLACEMENT;  Surgeon: Leta Baptist, MD;  Location: [place];  Service: ENT;  Laterality: Bilateral;  . VITRECTOMY Left 09/14/2015   Family History  Problem Relation Age of Onset  . Arthritis Mother   . Diabetes Mother   . CVA Mother   . Stroke Mother   . Diabetes Father   . Heart disease Father        CABG.  Does not know age of onset  . Asthma Father   . Hepatitis C Sister   . Arthritis Brother   . Cancer Brother        metastic cancer  . Peripheral Artery Disease Daughter   . Arthritis-Osteo Son     Social History   Socioeconomic History  . Marital status: Married    Spouse name: Not on file  . Number of children: 3  . Years of education: Not on file  . Highest education level: GED or equivalent  Occupational History  . Occupation: retired    Comment: Customer service manager and restaurants  Social Needs  . Financial resource strain: Somewhat hard  . Food insecurity:    Worry: Never true    Inability: Never true  . Transportation needs:    Medical: No    Non-medical: No  Tobacco Use  . Smoking status: Never Smoker  . Smokeless tobacco: Never Used  Substance and Sexual Activity  . Alcohol use: No  . Drug use: No  . Sexual activity: Yes  Lifestyle  . Physical activity:    Days per week: 0 days    Minutes per session: 0 min  . Stress: Very much  Relationships  . Social connections:    Talks on phone: More than three times a week    Gets together: More than three times a week    Attends religious service: Never    Active member of club or organization: Yes    Attends meetings of clubs or organizations: More than 4 times per year    Relationship status: Married  Other Topics Concern  . Not on file  Social History Narrative   Lives at home home with husband with son, daughter in law and 3 children.  [name], his girlfriend and her child also moved in 10/2018      Disabled.    Outpatient Encounter Medications as of 12/02/2018  Medication Sig  . albuterol (VENTOLIN HFA) 108 (90 Base) MCG/ACT inhaler Inhale 2 puffs into the lungs every 6 (six) hours as needed for wheezing or shortness of breath.  . ALPRAZolam (XANAX) 1 MG tablet TAKE 1 TABLET ('1MG'$  TOTAL) 3 (THREE)TIMES DAILY BY MOUTH  . amLODipine (NORVASC) 10 MG tablet TAKE 1 TABLET (5 MG TOTAL) BY MOUTH DAILY.  [name] Kitchen aspirin EC 81 MG tablet Take 81 mg by mouth daily.  [name] Kitchen atorvastatin (LIPITOR) 40 MG tablet TAKE 1 TABLET EVERY DAY AT 6PM  . blood glucose meter kit and supplies Use as directed  . ezetimibe (ZETIA) 10 MG  tablet Take 1 tablet (10 mg total) by mouth daily. For cholesterol  . fexofenadine (ALLEGRA ALLERGY) 180 MG tablet Take 1 tablet (180 mg total) by mouth daily.  . fluticasone furoate-vilanterol (BREO ELLIPTA) 100-25 MCG/INH AEPB Inhale 1 puff into the lungs daily.  . furosemide (LASIX) 20 MG tablet Take 1 tablet (20 mg total) by mouth  daily.  [name] Kitchen LANCETS ULTRA THIN MISC Test once daily.  . metFORMIN (GLUCOPHAGE-XR) 750 MG 24 hr tablet Take 1 tablet (750 mg total) by mouth daily with breakfast.  . metoprolol succinate (TOPROL-XL) 50 MG 24 hr tablet Take 1 tablet (50 mg total) by mouth daily. Take with or immediately following a meal.  . oxybutynin (DITROPAN XL) 15 MG 24 hr tablet TAKE 1 TABLET (15 MG TOTAL) BY MOUTH 2 (TWO) TIMES DAILY.  . pantoprazole (PROTONIX) 40 MG tablet Take 1 tablet (40 mg total) by mouth 2 (two) times daily. For stomach  . REMIFEMIN 20 MG TABS Take 1 tablet (20 mg total) by mouth daily.  [name] Kitchen trimethoprim-polymyxin b (POLYTRIM) ophthalmic solution Place 1 drop into both eyes every 4 (four) hours. While awake  . glucose blood test strip Test once daily. (Patient taking differently: Test up to 4 times daily)  . topiramate (TOPAMAX) 25 MG tablet 1 nightly times 1 week.  Then 2 nightly for 1 week.  Then 3 nightly for 1 week.  Then 4 nightly. (Patient not taking: Reported on 12/02/2018)  . [DISCONTINUED] azithromycin (ZITHROMAX) 250 MG tablet Take 2 the first day and then one each day after.  . [DISCONTINUED] guaiFENesin-codeine 100-10 MG/5ML syrup Take 5-10 mLs by mouth every 6 (six) hours as needed for cough.  . [DISCONTINUED] predniSONE (DELTASONE) 20 MG tablet 2 po at same time daily for 3 days   No facility-administered encounter medications on file as of 12/02/2018.     Activities of Daily Living In your present state of health, do you have any difficulty performing the following activities: 12/02/2018 01/28/2018  Hearing? N N  Vision? N N  Difficulty concentrating or making  decisions? N N  Walking or climbing stairs? N N  Dressing or bathing? N N  Doing errands, shopping? N -  Preparing Food and eating ? N -  Using the Toilet? N -  In the past six months, have you accidently leaked urine? Y -  Comment Stress incontinence  -  Do you have problems with loss of bowel control? N -  Managing your Medications? N -  Managing your Finances? N -  Housekeeping or managing your Housekeeping? N -  Some recent data might be hidden    Patient Care Team: [name], MD as PCP - General (Family Medicine) Vaughan Basta, Rona Ravens, NP as Nurse Practitioner (Internal Medicine) Pieter Partridge, DO as Consulting Physician (Neurology) Cleon Gustin, MD (Anesthesiology)- Pain management    Assessment:   This is a routine wellness examination for Yvonne [name].  Exercise Activities and Dietary recommendations  Yvonne [name] states she usually eats one meal per day and snacks as needed when she gets hungry.  She states she is usually only hungry when she eats her evening meal.  Suggested eating more regularly throughout the day.  Recommended eating mostly vegetables, lean proteins, and fruits and whole grains in moderation.  Patient states she has access to all the food she needs.   Current Exercise Habits: The patient does not participate in regular exercise at present  Goals    . Exercise 3 to 4 times per week (30 min per time)    . limit sugar     Cut out drinks / beverages with sugar.         Fall Risk Fall Risk  12/02/2018 11/21/2018 11/17/2018 10/22/2018 06/12/2018  Falls in the past year? 0 0 0 0 No  Number falls in past yr: - 0 - 0 -  Injury with Fall? - - - - -  Follow up - - - - -   Is the patient's home free of loose throw rugs in walkways, pet beds, electrical cords, etc?   yes      Grab bars in the bathroom? no      Handrails on the stairs?   yes      Adequate lighting?   yes    Depression Screen PHQ 2/9 Scores 12/02/2018 12/02/2018 11/21/2018 11/17/2018  PHQ - 2  Score 5 0 0 4  PHQ- 9 Score 19 - - 17     Cognitive Function MMSE - Mini Mental State Exam 12/02/2018 06/06/2017 07/20/2015  Orientation to time '5 5 5  '$ Orientation to Place '5 5 5  '$ Registration '3 3 3  '$ Attention/ Calculation '5 5 5  '$ Recall '3 3 3  '$ Language- name 2 objects '2 2 2  '$ Language- repeat '1 1 1  '$ Language- follow 3 step command '3 3 3  '$ Language- read & follow direction '1 1 1  '$ Write a sentence '1 1 1  '$ Copy design '1 1 1  '$ Total score '30 30 30        '$ Immunization History  Administered Date(s) Administered  . Influenza Split 08/18/2015  . Influenza,inj,Quad PF,6+ Mos 10/22/2018  . Influenza-Unspecified 09/14/2013  . Pneumococcal Conjugate-13 01/06/2015  . Pneumococcal Polysaccharide-23 10/22/2018  . Tdap 06/06/2017  . Zoster Recombinat (Shingrix) 06/06/2017, 02/26/2018    Qualifies for Shingles Vaccine?  Completed   Screening Tests Health Maintenance  Topic Date Due  . COLON CANCER SCREENING ANNUAL FOBT  07/12/2016  . OPHTHALMOLOGY EXAM  08/26/2016  . DEXA SCAN  07/19/2017  . FOOT EXAM  06/25/2018  . MAMMOGRAM  04/11/2019  . HEMOGLOBIN A1C  04/22/2019  . URINE MICROALBUMIN  10/23/2019  . COLONOSCOPY  08/16/2025  . TETANUS/TDAP  06/07/2027  . INFLUENZA VACCINE  Completed  . Hepatitis C Screening  Completed  . PNA vac Low Risk Adult  Completed   FOBT given today Patient states she will call to schedule diabetic eye exam at [facility] Recommend foot exam at next visit with Dr. [name] Patient will have dexa scan when she comes in for appointment with Dr. [name] on 12/05/18.   Cancer Screenings: Lung: Low Dose CT Chest recommended if Age 89-80 years, 30 pack-year currently smoking OR have quit w/in 15years. Patient does not qualify. Breast:  Up to date on Mammogram? Yes   Up to date of Bone Density/Dexa? No, patient plans to have on Friday when she comes in for appointment with Dr. [name].  Colorectal: Colonoscopy up to date. FOBT given to take home for  completion.  Additional Screenings: Hepatitis C Screening:  Completed 07/20/15     Plan:     Continue to work on your goals of exercising 3-4 times per week for 30 minutes each session, and avoiding drinks with sugar in them.  Review the paperwork on Advance Directives.  If you complete it, please bring a copy to our office to be filed in your medical record. Follow up with Dr. [name] as scheduled.   Schedule diabetic eye exam.  I have personally reviewed and noted the following in the patient's chart:   . Medical and social history . Use of alcohol, tobacco or illicit drugs  . Current medications and supplements . Functional ability and status . Nutritional status . Physical activity . Advanced directives . List of other physicians . Hospitalizations, surgeries, and ER visits in previous 71  months . Vitals . Screenings to include cognitive, depression, and falls . Referrals and appointments  In addition, I have reviewed and discussed with patient certain preventive protocols, quality metrics, and best practice recommendations. A written personalized care plan for preventive services as well as general preventive health recommendations were provided to patient.     Cyndal Kasson M, RN  12/02/2018   I have reviewed and agree with the above AWV documentation.  [name], M.D.

## 2018-12-03 ENCOUNTER — Other Ambulatory Visit: Payer: Medicare HMO

## 2018-12-03 DIAGNOSIS — Z1211 Encounter for screening for malignant neoplasm of colon: Secondary | ICD-10-CM

## 2018-12-04 LAB — FECAL OCCULT BLOOD, IMMUNOCHEMICAL: Fecal Occult Bld: NEGATIVE

## 2018-12-05 ENCOUNTER — Ambulatory Visit (INDEPENDENT_AMBULATORY_CARE_PROVIDER_SITE_OTHER): Payer: Medicare HMO

## 2018-12-05 ENCOUNTER — Other Ambulatory Visit: Payer: Self-pay | Admitting: Family Medicine

## 2018-12-05 ENCOUNTER — Ambulatory Visit (INDEPENDENT_AMBULATORY_CARE_PROVIDER_SITE_OTHER): Payer: Medicare HMO | Admitting: Family Medicine

## 2018-12-05 ENCOUNTER — Encounter: Payer: Self-pay | Admitting: Family Medicine

## 2018-12-05 VITALS — BP 145/86 | HR 70 | Temp 97.1°F | Ht 62.0 in | Wt 178.4 lb

## 2018-12-05 DIAGNOSIS — F1320 Sedative, hypnotic or anxiolytic dependence, uncomplicated: Secondary | ICD-10-CM | POA: Diagnosis not present

## 2018-12-05 DIAGNOSIS — E119 Type 2 diabetes mellitus without complications: Secondary | ICD-10-CM | POA: Diagnosis not present

## 2018-12-05 DIAGNOSIS — F411 Generalized anxiety disorder: Secondary | ICD-10-CM | POA: Diagnosis not present

## 2018-12-05 DIAGNOSIS — M8588 Other specified disorders of bone density and structure, other site: Secondary | ICD-10-CM | POA: Diagnosis not present

## 2018-12-05 DIAGNOSIS — E756 Lipid storage disorder, unspecified: Principal | ICD-10-CM

## 2018-12-05 DIAGNOSIS — E785 Hyperlipidemia, unspecified: Secondary | ICD-10-CM | POA: Diagnosis not present

## 2018-12-05 DIAGNOSIS — E1169 Type 2 diabetes mellitus with other specified complication: Secondary | ICD-10-CM

## 2018-12-05 DIAGNOSIS — Z780 Asymptomatic menopausal state: Secondary | ICD-10-CM

## 2018-12-05 LAB — BAYER DCA HB A1C WAIVED: HB A1C (BAYER DCA - WAIVED): 7.8 % — ABNORMAL HIGH (ref <7.0)

## 2018-12-05 MED ORDER — METFORMIN HCL ER 750 MG PO TB24: 750.0000 mg | ORAL_TABLET | Freq: Two times a day (BID) | ORAL | 5 refills | Status: DC

## 2018-12-05 MED ORDER — ALPRAZOLAM 1 MG PO TABS: ORAL_TABLET | ORAL | 5 refills | Status: DC

## 2018-12-05 NOTE — Progress Notes (Signed)
Subjective:  Patient ID: Yvonne [name],  female    DOB: 05-29-1951  Age: 68 y.o.    CC: Medical Management of Chronic Issues   HPI Yvonne [name] presents for  follow-up of hypertension. Patient has no history of headache chest pain or shortness of breath or recent cough. Patient also denies symptoms of TIA such as numbness weakness lateralizing. Patient denies side effects from medication. States taking it regularly.  Patient also  in for follow-up of elevated cholesterol. Doing well without complaints on current medication. Denies side effects  including myalgia and arthralgia and nausea. Also in today for liver function testing. Currently no chest pain, shortness of breath or other cardiovascular related symptoms noted.  Follow-up of diabetes. Patient not checking glucose recently. Patient denies symptoms such as excessive hunger or urinary frequency, excessive hunger, nausea No significant hypoglycemic spells noted. Medications reviewed. Pt reports taking them regularly. Pt. denies complication/adverse reaction today.  GAD 7 : Generalized Anxiety Score 12/05/2018 01/22/2018 04/30/2016 11/04/2015  Nervous, Anxious, on Edge _0 0  Control/stop worrying _1 Worry too much - different things _2 Trouble relaxing _3 Restless _4 Easily annoyed or irritable _5 Afraid - awful might happen 3 1 0 1  Total GAD 7 Score _6 Anxiety Difficulty Extremely difficult - Somewhat difficult -      History Yvonne [name] has a past medical history of Anxiety, Arthritis, Asthma, Chronic cough, Chronic otitis media (12/2017), Full dentures, GERD (gastroesophageal reflux disease), History of stroke (09/2017), Hyperlipidemia, Hypertension, Non-insulin dependent type 2 diabetes mellitus ([place]), Overactive bladder, Recurrent acute suppurative otitis media without spontaneous rupture of left tympanic membrane (02/19/2018), and Transient cerebral ischemia.   She has a past surgical  history that includes Cholecystectomy; Colonoscopy (N/A, 08/17/2015); Abdominal hysterectomy; Lacrimal duct exploration (Bilateral); Cataract extraction w/ intraocular lens implant (Right); Vitrectomy (Left, 09/14/2015); Gas insertion (Right); and Myringotomy with tube placement (Bilateral, 01/28/2018).   Her family history includes Arthritis in her brother and mother; Arthritis-Osteo in her son; Asthma in her father; CVA in her mother; Cancer in her brother; Diabetes in her father and mother; Heart disease in her father; Hepatitis C in her sister; Peripheral Artery Disease in her daughter; Stroke in her mother.She reports that she has never smoked. She has never used smokeless tobacco. She reports that she does not drink alcohol or use drugs.  Current Outpatient Medications on File Prior to Visit  Medication Sig Dispense Refill  . albuterol (VENTOLIN HFA) 108 (90 Base) MCG/ACT inhaler Inhale 2 puffs into the lungs every 6 (six) hours as needed for wheezing or shortness of breath. 18 Inhaler 5  . amLODipine (NORVASC) 10 MG tablet TAKE 1 TABLET (5 MG TOTAL) BY MOUTH DAILY. 90 tablet 1  . aspirin EC 81 MG tablet Take 81 mg by mouth daily.    [name] Kitchen atorvastatin (LIPITOR) 40 MG tablet TAKE 1 TABLET EVERY DAY AT 6PM 90 tablet 1  . blood glucose meter kit and supplies Use as directed 1 each 0  . ezetimibe (ZETIA) 10 MG tablet Take 1 tablet (10 mg total) by mouth daily. For cholesterol 90 tablet 1  . fexofenadine (ALLEGRA ALLERGY) 180 MG tablet Take 1 tablet (180 mg total) by mouth daily. 30 tablet 11  . fluticasone furoate-vilanterol (BREO ELLIPTA) 100-25 MCG/INH AEPB Inhale 1 puff into the lungs daily. 180 each 5  .  furosemide (LASIX) 20 MG tablet Take 1 tablet (20 mg total) by mouth daily. 90 tablet 1  . glucose blood test strip Test once daily. (Patient taking differently: Test up to 4 times daily) 100 each 12  . LANCETS ULTRA THIN MISC Test once daily. 100 each prn  . metFORMIN (GLUCOPHAGE-XR) 750 MG 24 hr  tablet Take 1 tablet (750 mg total) by mouth daily with breakfast. 30 tablet 5  . pantoprazole (PROTONIX) 40 MG tablet Take 1 tablet (40 mg total) by mouth 2 (two) times daily. For stomach 60 tablet 5  . REMIFEMIN 20 MG TABS Take 1 tablet (20 mg total) by mouth daily. 60 each 0  . metoprolol succinate (TOPROL-XL) 50 MG 24 hr tablet Take 1 tablet (50 mg total) by mouth daily. Take with or immediately following a meal. (Patient not taking: Reported on 12/05/2018) 90 tablet 1  . oxybutynin (DITROPAN XL) 15 MG 24 hr tablet TAKE 1 TABLET (15 MG TOTAL) BY MOUTH 2 (TWO) TIMES DAILY. (Patient not taking: Reported on 12/05/2018) 180 tablet 1   No current facility-administered medications on file prior to visit.     ROS Review of Systems  Constitutional: Negative.   HENT: Negative for congestion.   Eyes: Negative for visual disturbance.  Respiratory: Negative for shortness of breath.   Cardiovascular: Negative for chest pain.  Gastrointestinal: Negative for abdominal pain, constipation, diarrhea, nausea and vomiting.  Genitourinary: Negative for difficulty urinating.  Musculoskeletal: Negative for arthralgias and myalgias.  Neurological: Negative for headaches.  Psychiatric/Behavioral: Negative for sleep disturbance.    Objective:  BP (!) 145/86   Pulse 70   Temp (!) 97.1 F (36.2 C) (Oral)   Ht 5' 2" (1.575 m)   Wt 178 lb 6 oz (80.9 kg)   BMI 32.63 kg/m   BP Readings from Last 3 Encounters:  12/05/18 (!) 145/86  12/02/18 (!) 167/86  11/21/18 118/70    Wt Readings from Last 3 Encounters:  12/05/18 178 lb 6 oz (80.9 kg)  12/02/18 179 lb (81.2 kg)  11/21/18 177 lb 6.4 oz (80.5 kg)     Physical Exam Constitutional:      General: She is not in acute distress.    Appearance: She is well-developed.  HENT:     Head: Normocephalic and atraumatic.     Right Ear: External ear normal.     Left Ear: External ear normal.     Nose: Nose normal.  Eyes:     Conjunctiva/sclera:  Conjunctivae normal.     Pupils: Pupils are equal, round, and reactive to light.  Neck:     Musculoskeletal: Normal range of motion and neck supple.     Thyroid: No thyromegaly.  Cardiovascular:     Rate and Rhythm: Normal rate and regular rhythm.     Heart sounds: Normal heart sounds. No murmur.  Pulmonary:     Effort: Pulmonary effort is normal. No respiratory distress.     Breath sounds: Normal breath sounds. No wheezing or rales.  Abdominal:     General: Bowel sounds are normal. There is no distension.     Palpations: Abdomen is soft.     Tenderness: There is no abdominal tenderness.  Lymphadenopathy:     Cervical: No cervical adenopathy.  Skin:    General: Skin is warm and dry.  Neurological:     Mental Status: She is alert and oriented to person, place, and time.     Deep Tendon Reflexes: Reflexes are normal and symmetric.  Psychiatric:        Behavior: Behavior normal.        Thought Content: Thought content normal.        Judgment: Judgment normal.     Assessment & Plan:   Yvonne [name] was seen today for medical management of chronic issues.  Diagnoses and all orders for this visit:  Hyperlipidemia with target LDL less than 70 -     CMP14+EGFR -     Lipid panel  Type 2 diabetes mellitus without complication, without long-term current use of insulin (HCC) -     CMP14+EGFR -     Bayer DCA Hb A1c Waived  Benzodiazepine dependence, continuous (HCC)  GAD (generalized anxiety disorder)  Other orders -     ALPRAZolam (XANAX) 1 MG tablet; TAKE 1 TABLET (1MG TOTAL) 3 (THREE)TIMES DAILY BY MOUTH   I have discontinued [name]. Cantero's topiramate and trimethoprim-polymyxin b. I am also having her maintain her LANCETS ULTRA THIN, glucose blood, blood glucose meter kit and supplies, aspirin EC, fexofenadine, albuterol, atorvastatin, ezetimibe, fluticasone furoate-vilanterol, furosemide, metFORMIN, metoprolol succinate, pantoprazole, REMIFEMIN, amLODipine, oxybutynin, and  ALPRAZolam.  Meds ordered this encounter  Medications  . ALPRAZolam (XANAX) 1 MG tablet    Sig: TAKE 1 TABLET (1MG TOTAL) 3 (THREE)TIMES DAILY BY MOUTH    Dispense:  90 tablet    Refill:  5     Follow-up: Return in about 3 months (around 03/06/2019).  [name], M.D.

## 2018-12-06 LAB — LIPID PANEL
Chol/HDL Ratio: 4.2 ratio (ref 0.0–4.4)
Cholesterol, Total: 229 mg/dL — ABNORMAL HIGH (ref 100–199)
HDL: 54 mg/dL (ref >39)
LDL Calculated: 147 mg/dL — ABNORMAL HIGH (ref 0–99)
Triglycerides: 139 mg/dL (ref 0–149)
VLDL Cholesterol Cal: 28 mg/dL (ref 5–40)

## 2018-12-06 LAB — CMP14+EGFR
ALT: 14 IU/L (ref 0–32)
AST: 15 IU/L (ref 0–40)
Albumin/Globulin Ratio: 2 (ref 1.2–2.2)
Albumin: 4.3 g/dL (ref 3.6–4.8)
Alkaline Phosphatase: 106 IU/L (ref 39–117)
BUN/Creatinine Ratio: 24 (ref 12–28)
BUN: 20 mg/dL (ref 8–27)
Bilirubin Total: 0.4 mg/dL (ref 0.0–1.2)
CALCIUM: 9.3 mg/dL (ref 8.7–10.3)
CO2: 23 mmol/L (ref 20–29)
Chloride: 102 mmol/L (ref 96–106)
Creatinine, Ser: 0.85 mg/dL (ref 0.57–1.00)
GFR calc Af Amer: 81 mL/min/{1.73_m2} (ref >59)
GFR calc non Af Amer: 71 mL/min/{1.73_m2} (ref >59)
Globulin, Total: 2.2 g/dL (ref 1.5–4.5)
Glucose: 138 mg/dL — ABNORMAL HIGH (ref 65–99)
Potassium: 4 mmol/L (ref 3.5–5.2)
Sodium: 141 mmol/L (ref 134–144)
Total Protein: 6.5 g/dL (ref 6.0–8.5)

## 2018-12-09 ENCOUNTER — Ambulatory Visit: Payer: Self-pay | Admitting: Licensed Clinical Social Worker

## 2018-12-09 DIAGNOSIS — F411 Generalized anxiety disorder: Secondary | ICD-10-CM

## 2018-12-09 NOTE — Progress Notes (Addendum)
  Chronic Care Management    Clinical Social Work General Follow Up Note  12/09/2018 Name: Yvonne [name] MRN: [id number] DOB: [birth year]   Referred by: Dr. [name] for assessment of psychosocial needs and support.   Review of patient status, including review of consultants reports, relevant laboratory and other test results, and collaboration with appropriate care team members and the patient's provider was performed as part of comprehensive patient evaluation and provision of chronic care management services.    Last CCM Appointment: 12/09/2018    Office Visit from 12/05/2018 in [place]  PHQ-9 Total Score  16     GAD 7 : Generalized Anxiety Score 12/05/2018 01/22/2018 04/30/2016 11/04/2015  Nervous, Anxious, on Edge _0 0  Control/stop worrying _1 Worry too much - different things _2 Trouble relaxing _3 Restless _4 Easily annoyed or irritable _5 Afraid - awful might happen 3 1 0 1  Total GAD 7 Score _6 Anxiety Difficulty Extremely difficult - Somewhat difficult -     [name] was given information about Chronic Care Management services today including:  1. CCM service includes personalized support from designated clinical staff supervised by her physician, including individualized plan of care and coordination with other care providers 2. 24/7 contact phone numbers for assistance for urgent and routine care needs. 3. Service will only be billed when office clinical staff spend 20 minutes or more in a month to coordinate care. 4. Only one practitioner may furnish and bill the service in a calendar month. 5. The patient may stop CCM services at any time (effective at the end of the month) by phone call to the office staff. 6. The patient will be responsible for cost sharing (co-pay) of up to 20% of the service fee (after annual deductible is met).  Patient did not agree to [facility] services but is considering and agreed for  [name] to call her back in 2 weeks to further discuss CCM program services.  Plan:  [name] to call client in 2 weeks to further discuss CCM program services.  Client to call [name] as needed to discuss CCM program services provision.  [name]. MSW, [name] Licensed Clinical Social Worker [facility] Care Management [phone]  [name] collaborated with [name] related to client consent process for CCM services.  [name]. MSW, [name] Licensed Clinical Social Worker [facility] Care Management [phone]

## 2018-12-09 NOTE — Patient Instructions (Signed)
[name] was given information about Chronic Care Management services today including:  1. CCM service includes personalized support from designated clinical staff supervised by her physician, including individualized plan of care and coordination with other care providers 2. 24/7 contact phone numbers for assistance for urgent and routine care needs. 3. Service will only be billed when office clinical staff spend 20 minutes or more in a month to coordinate care. 4. Only one practitioner may furnish and bill the service in a calendar month. 5. The patient may stop CCM services at any time (effective at the end of the month) by phone call to the office staff. 6. The patient will be responsible for cost sharing (co-pay) of up to 20% of the service fee (after annual deductible is met).  Patient did not agree to [facility] services but is considering and agreed for [name] to call her in two weeks to further discuss CCM services.   The patient verbalized understanding of instructions provided today and declined a print copy of patient instruction materials.   Plan:   [name] to call client in 2 weeks to discuss CCM program services  Client to call [name] to discuss CCM program services provision.   Yvonne [name].Yvonne [name] MSW, [name] Licensed Clinical Social Worker [facility] [phone]

## 2018-12-11 ENCOUNTER — Emergency Department (HOSPITAL_COMMUNITY): Payer: Medicare HMO

## 2018-12-11 ENCOUNTER — Observation Stay (HOSPITAL_COMMUNITY): Payer: Medicare HMO

## 2018-12-11 ENCOUNTER — Encounter (HOSPITAL_COMMUNITY): Payer: Self-pay | Admitting: Emergency Medicine

## 2018-12-11 ENCOUNTER — Other Ambulatory Visit: Payer: Self-pay

## 2018-12-11 ENCOUNTER — Observation Stay (HOSPITAL_COMMUNITY)
Admission: EM | Admit: 2018-12-11 | Discharge: 2018-12-12 | Disposition: A | Discharge status: Home / Self Care | Payer: Medicare HMO | Attending: Internal Medicine | Admitting: Internal Medicine

## 2018-12-11 DIAGNOSIS — K219 Gastro-esophageal reflux disease without esophagitis: Secondary | ICD-10-CM | POA: Diagnosis present

## 2018-12-11 DIAGNOSIS — E785 Hyperlipidemia, unspecified: Secondary | ICD-10-CM | POA: Diagnosis present

## 2018-12-11 DIAGNOSIS — I110 Hypertensive heart disease with heart failure: Secondary | ICD-10-CM | POA: Insufficient documentation

## 2018-12-11 DIAGNOSIS — I10 Essential (primary) hypertension: Secondary | ICD-10-CM | POA: Diagnosis not present

## 2018-12-11 DIAGNOSIS — E119 Type 2 diabetes mellitus without complications: Secondary | ICD-10-CM | POA: Diagnosis not present

## 2018-12-11 DIAGNOSIS — J45909 Unspecified asthma, uncomplicated: Secondary | ICD-10-CM | POA: Insufficient documentation

## 2018-12-11 DIAGNOSIS — R4020 Unspecified coma: Secondary | ICD-10-CM | POA: Diagnosis not present

## 2018-12-11 DIAGNOSIS — R200 Anesthesia of skin: Secondary | ICD-10-CM | POA: Diagnosis not present

## 2018-12-11 DIAGNOSIS — I5030 Unspecified diastolic (congestive) heart failure: Secondary | ICD-10-CM | POA: Diagnosis present

## 2018-12-11 DIAGNOSIS — I5032 Chronic diastolic (congestive) heart failure: Secondary | ICD-10-CM | POA: Diagnosis not present

## 2018-12-11 DIAGNOSIS — R079 Chest pain, unspecified: Secondary | ICD-10-CM | POA: Diagnosis not present

## 2018-12-11 DIAGNOSIS — G459 Transient cerebral ischemic attack, unspecified: Secondary | ICD-10-CM

## 2018-12-11 DIAGNOSIS — Z7982 Long term (current) use of aspirin: Secondary | ICD-10-CM | POA: Insufficient documentation

## 2018-12-11 DIAGNOSIS — R531 Weakness: Secondary | ICD-10-CM | POA: Diagnosis not present

## 2018-12-11 DIAGNOSIS — Z79899 Other long term (current) drug therapy: Secondary | ICD-10-CM | POA: Insufficient documentation

## 2018-12-11 DIAGNOSIS — E876 Hypokalemia: Secondary | ICD-10-CM | POA: Diagnosis not present

## 2018-12-11 LAB — URINALYSIS, ROUTINE W REFLEX MICROSCOPIC
Bilirubin Urine: NEGATIVE
Glucose, UA: 50 mg/dL — AB
Ketones, ur: NEGATIVE mg/dL
Nitrite: NEGATIVE
Protein, ur: NEGATIVE mg/dL
Specific Gravity, Urine: 1.014 (ref 1.005–1.030)
pH: 6 (ref 5.0–8.0)

## 2018-12-11 LAB — I-STAT CHEM 8, ED
BUN: 19 mg/dL (ref 8–23)
CALCIUM ION: 1.19 mmol/L (ref 1.15–1.40)
CHLORIDE: 100 mmol/L (ref 98–111)
Creatinine, Ser: 0.8 mg/dL (ref 0.44–1.00)
Glucose, Bld: 217 mg/dL — ABNORMAL HIGH (ref 70–99)
HCT: 38 % (ref 36.0–46.0)
Hemoglobin: 12.9 g/dL (ref 12.0–15.0)
Potassium: 3 mmol/L — ABNORMAL LOW (ref 3.5–5.1)
Sodium: 138 mmol/L (ref 135–145)
TCO2: 27 mmol/L (ref 22–32)

## 2018-12-11 LAB — CBG MONITORING, ED: Glucose-Capillary: 194 mg/dL — ABNORMAL HIGH (ref 70–99)

## 2018-12-11 LAB — CBC
HCT: 38.1 % (ref 36.0–46.0)
Hemoglobin: 12.4 g/dL (ref 12.0–15.0)
MCH: 27.7 pg (ref 26.0–34.0)
MCHC: 32.5 g/dL (ref 30.0–36.0)
MCV: 85.2 fL (ref 80.0–100.0)
Platelets: 231 10*3/uL (ref 150–400)
RBC: 4.47 MIL/uL (ref 3.87–5.11)
RDW: 13.1 % (ref 11.5–15.5)
WBC: 6.9 10*3/uL (ref 4.0–10.5)
nRBC: 0 % (ref 0.0–0.2)

## 2018-12-11 LAB — COMPREHENSIVE METABOLIC PANEL
ALK PHOS: 87 U/L (ref 38–126)
ALT: 20 U/L (ref 0–44)
AST: 23 U/L (ref 15–41)
Albumin: 3.5 g/dL (ref 3.5–5.0)
Anion gap: 7 (ref 5–15)
BUN: 20 mg/dL (ref 8–23)
CO2: 28 mmol/L (ref 22–32)
CREATININE: 0.83 mg/dL (ref 0.44–1.00)
Calcium: 8.8 mg/dL — ABNORMAL LOW (ref 8.9–10.3)
Chloride: 100 mmol/L (ref 98–111)
GFR calc Af Amer: >60 mL/min (ref >60)
GFR calc non Af Amer: >60 mL/min (ref >60)
Glucose, Bld: 222 mg/dL — ABNORMAL HIGH (ref 70–99)
Potassium: 3 mmol/L — ABNORMAL LOW (ref 3.5–5.1)
Sodium: 135 mmol/L (ref 135–145)
Total Bilirubin: 0.4 mg/dL (ref 0.3–1.2)
Total Protein: 6.5 g/dL (ref 6.5–8.1)

## 2018-12-11 LAB — DIFFERENTIAL
Abs Immature Granulocytes: 0.01 10*3/uL (ref 0.00–0.07)
Basophils Absolute: 0 10*3/uL (ref 0.0–0.1)
Basophils Relative: 0 %
Eosinophils Absolute: 0.1 10*3/uL (ref 0.0–0.5)
Eosinophils Relative: 1 %
Immature Granulocytes: 0 %
LYMPHS PCT: 38 %
Lymphs Abs: 2.6 10*3/uL (ref 0.7–4.0)
Monocytes Absolute: 0.5 10*3/uL (ref 0.1–1.0)
Monocytes Relative: 7 %
NEUTROS ABS: 3.7 10*3/uL (ref 1.7–7.7)
NEUTROS PCT: 54 %

## 2018-12-11 LAB — RAPID URINE DRUG SCREEN, HOSP PERFORMED
Amphetamines: NOT DETECTED
Barbiturates: NOT DETECTED
Benzodiazepines: NOT DETECTED
Cocaine: NOT DETECTED
Opiates: NOT DETECTED
Tetrahydrocannabinol: NOT DETECTED

## 2018-12-11 LAB — PROTIME-INR
INR: 0.94
Prothrombin Time: 12.5 seconds (ref 11.4–15.2)

## 2018-12-11 LAB — I-STAT TROPONIN, ED: Troponin i, poc: 0.01 ng/mL (ref 0.00–0.08)

## 2018-12-11 LAB — ETHANOL: Alcohol, Ethyl (B): <10 mg/dL (ref <10)

## 2018-12-11 LAB — MAGNESIUM: MAGNESIUM: 2.1 mg/dL (ref 1.7–2.4)

## 2018-12-11 LAB — PHOSPHORUS: Phosphorus: 3.5 mg/dL (ref 2.5–4.6)

## 2018-12-11 LAB — APTT: aPTT: 32 seconds (ref 24–36)

## 2018-12-11 MED ORDER — ENOXAPARIN SODIUM 40 MG/0.4ML ~~LOC~~ SOLN
40.0000 mg | SUBCUTANEOUS | Status: DC
  Filled 2018-12-11: qty 0.4

## 2018-12-11 MED ORDER — FLUTICASONE FUROATE-VILANTEROL 100-25 MCG/INH IN AEPB
1.0000 | INHALATION_SPRAY | Freq: Every day | RESPIRATORY_TRACT | Status: DC
  Filled 2018-12-11: qty 28

## 2018-12-11 MED ORDER — ALPRAZOLAM 1 MG PO TABS
1.0000 mg | ORAL_TABLET | Freq: Three times a day (TID) | ORAL | Status: DC | PRN
  Administered 2018-12-12: 1 mg via ORAL
  Filled 2018-12-11: qty 1

## 2018-12-11 MED ORDER — EZETIMIBE 10 MG PO TABS: 10.0000 mg | ORAL_TABLET | Freq: Every evening | ORAL | Status: DC

## 2018-12-11 MED ORDER — PANTOPRAZOLE SODIUM 40 MG PO TBEC
40.0000 mg | DELAYED_RELEASE_TABLET | Freq: Two times a day (BID) | ORAL | Status: DC
  Administered 2018-12-12 (×2): 40 mg via ORAL
  Filled 2018-12-11 (×2): qty 1

## 2018-12-11 MED ORDER — ACETAMINOPHEN 325 MG PO TABS: 650.0000 mg | ORAL_TABLET | ORAL | Status: DC | PRN

## 2018-12-11 MED ORDER — STROKE: EARLY STAGES OF RECOVERY BOOK
Freq: Once | Status: AC
  Administered 2018-12-12: 01:00:00
  Filled 2018-12-11 (×2): qty 1

## 2018-12-11 MED ORDER — ENOXAPARIN SODIUM 40 MG/0.4ML ~~LOC~~ SOLN: 40.0000 mg | SUBCUTANEOUS | Status: DC

## 2018-12-11 MED ORDER — ONDANSETRON HCL 4 MG PO TABS: 4.0000 mg | ORAL_TABLET | Freq: Four times a day (QID) | ORAL | Status: DC | PRN

## 2018-12-11 MED ORDER — ACETAMINOPHEN 650 MG RE SUPP: 650.0000 mg | RECTAL | Status: DC | PRN

## 2018-12-11 MED ORDER — ATORVASTATIN CALCIUM 40 MG PO TABS: 40.0000 mg | ORAL_TABLET | Freq: Every evening | ORAL | Status: DC

## 2018-12-11 MED ORDER — METFORMIN HCL ER 750 MG PO TB24
750.0000 mg | ORAL_TABLET | Freq: Two times a day (BID) | ORAL | Status: DC
  Administered 2018-12-12: 750 mg via ORAL
  Filled 2018-12-11 (×7): qty 1

## 2018-12-11 MED ORDER — ACETAMINOPHEN 160 MG/5ML PO SOLN: 650.0000 mg | ORAL | Status: DC | PRN

## 2018-12-11 MED ORDER — POTASSIUM CHLORIDE CRYS ER 20 MEQ PO TBCR
40.0000 meq | EXTENDED_RELEASE_TABLET | Freq: Once | ORAL | Status: AC
  Administered 2018-12-11: 40 meq via ORAL
  Filled 2018-12-11: qty 2

## 2018-12-11 MED ORDER — INSULIN ASPART 100 UNIT/ML ~~LOC~~ SOLN: 0.0000 [IU] | Freq: Every day | SUBCUTANEOUS | Status: DC

## 2018-12-11 MED ORDER — ASPIRIN EC 81 MG PO TBEC
81.0000 mg | DELAYED_RELEASE_TABLET | Freq: Every morning | ORAL | Status: DC
  Administered 2018-12-12: 81 mg via ORAL
  Filled 2018-12-11: qty 1

## 2018-12-11 MED ORDER — INSULIN ASPART 100 UNIT/ML ~~LOC~~ SOLN: 0.0000 [IU] | Freq: Three times a day (TID) | SUBCUTANEOUS | Status: DC

## 2018-12-11 MED ORDER — LORATADINE 10 MG PO TABS
10.0000 mg | ORAL_TABLET | Freq: Every day | ORAL | Status: DC
  Administered 2018-12-12: 10 mg via ORAL
  Filled 2018-12-11: qty 1

## 2018-12-11 MED ORDER — ONDANSETRON HCL 4 MG/2ML IJ SOLN: 4.0000 mg | Freq: Four times a day (QID) | INTRAMUSCULAR | Status: DC | PRN

## 2018-12-11 NOTE — ED Provider Notes (Signed)
[facility] EMERGENCY DEPARTMENT Provider Note   CSN: [id number] Arrival date & time: 12/11/18  1925     History   Chief Complaint Chief Complaint  Patient presents with  . Tingling    HPI Yvonne [name] is a 68 y.o. female.  Patient states at 11 AM she started having numbness in her right hand and right leg with weakness in her right leg but she has an old stroke on that right leg.  She stated this numbness occurred when she had her first stroke and she was told to come to the emergency department if this happens again  The history is provided by the patient. No language interpreter was used.  Weakness  Severity:  Mild Onset quality:  Sudden Timing:  Constant Progression:  Unchanged Chronicity:  New Context: not alcohol use   Relieved by:  Nothing Worsened by:  Nothing Ineffective treatments:  None tried Associated symptoms: no abdominal pain, no chest pain, no cough, no diarrhea, no frequency, no headaches and no seizures     Past Medical History:  Diagnosis Date  . Anxiety   . Arthritis    left hand  . Asthma    daily and prn inhalers  . Chronic cough   . Chronic otitis media 12/2017  . Full dentures   . GERD (gastroesophageal reflux disease)   . History of stroke 09/2017   weakness right hand, numbness right side face  . Hyperlipidemia   . Hypertension    states under control with meds., has been on med. x a long time, per pt.  . Non-insulin dependent type 2 diabetes mellitus ([place])   . Overactive bladder   . Recurrent acute suppurative otitis media without spontaneous rupture of left tympanic membrane 02/19/2018  . Transient cerebral ischemia     Patient Active Problem List   Diagnosis Date Noted  . Right sided numbness 12/11/2018  . Benzodiazepine dependence, continuous ([place]) 10/22/2018  . Chronic migraine without aura without status migrainosus, not intractable 02/19/2018  . Chronic diastolic CHF (congestive heart failure) ([place]) 03/21/2017  .  Hemiparesis affecting dominant side as late effect of cerebrovascular accident (CVA) ([place]) 10/15/2016  . Lethargy   . Weakness 09/28/2016  . Pain of both hip joints 06/18/2016  . Pain syndrome, chronic 06/18/2016  . Chronic cough 05/17/2016  . Hyperlipidemia with target LDL less than 70 11/04/2015  . Macular hole of right eye 09/14/2015  . Obesity 07/20/2015  . Osteopenia 07/20/2015  . Type 2 diabetes mellitus ([place]) 07/20/2015  . Overactive bladder   . Hypertension   . GAD (generalized anxiety disorder)   . Acid reflux disease 08/17/2014    Past Surgical History:  Procedure Laterality Date  . ABDOMINAL HYSTERECTOMY     complete  . CATARACT EXTRACTION W/ INTRAOCULAR LENS IMPLANT Right   . CHOLECYSTECTOMY    . COLONOSCOPY N/A 08/17/2015   Procedure: COLONOSCOPY;  Surgeon: Rogene Houston, MD;  Location: [facility];  Service: Endoscopy;  Laterality: N/A;  200 - moved to 7:30 - Ann notified pt  . GAS INSERTION Right    x 2 - eye  . LACRIMAL DUCT EXPLORATION Bilateral    removal of tear ducts  . MYRINGOTOMY WITH TUBE PLACEMENT Bilateral 01/28/2018   Procedure: BILATERAL MYRINGOTOMY WITH TUBE PLACEMENT;  Surgeon: Leta Baptist, MD;  Location: [place];  Service: ENT;  Laterality: Bilateral;  . VITRECTOMY Left 09/14/2015     OB History   No obstetric history on file.  Home Medications    Prior to Admission medications   Medication Sig Start Date End Date Taking? Authorizing Provider  albuterol (VENTOLIN HFA) 108 (90 Base) MCG/ACT inhaler Inhale 2 puffs into the lungs every 6 (six) hours as needed for wheezing or shortness of breath. 10/22/18  Yes [name], [name], MD  ALPRAZolam Duanne Moron) 1 MG tablet TAKE 1 TABLET (1MG  TOTAL) 3 (THREE)TIMES DAILY BY MOUTH Patient taking differently: Take 1 mg by mouth 3 (three) times daily.  12/05/18  Yes [name], [name], MD  amLODipine (NORVASC) 10 MG tablet TAKE 1 TABLET (5 MG TOTAL) BY MOUTH DAILY. Patient taking differently:  Take 10 mg by mouth every morning.  10/22/18  Yes [name], MD  aspirin EC 81 MG tablet Take 81 mg by mouth every morning.    Yes [provider]  atorvastatin (LIPITOR) 40 MG tablet TAKE 1 TABLET EVERY DAY AT 6PM Patient taking differently: Take 40 mg by mouth every evening.  10/22/18  Yes [name], [name], MD  ezetimibe (ZETIA) 10 MG tablet Take 1 tablet (10 mg total) by mouth daily. For cholesterol Patient taking differently: Take 10 mg by mouth every evening. For cholesterol 10/22/18  Yes [name], [name], MD  fexofenadine [facility] ALLERGY) 180 MG tablet Take 1 tablet (180 mg total) by mouth daily. Patient taking differently: Take 180 mg by mouth every morning.  06/20/18  Yes [name], [name], MD  fluticasone furoate-vilanterol (BREO ELLIPTA) 100-25 MCG/INH AEPB Inhale 1 puff into the lungs daily. 10/22/18  Yes [name], [name], MD  furosemide (LASIX) 20 MG tablet Take 1 tablet (20 mg total) by mouth daily. 10/22/18  Yes [name], MD  metFORMIN (GLUCOPHAGE-XR) 750 MG 24 hr tablet Take 1 tablet (750 mg total) by mouth 2 (two) times daily. 12/05/18  Yes [name], MD  pantoprazole (PROTONIX) 40 MG tablet Take 1 tablet (40 mg total) by mouth 2 (two) times daily. For stomach 10/22/18  Yes [name], MD  BELBUCA 150 MCG FILM  11/22/18   [provider]  loratadine (CLARITIN) 10 MG tablet TAKE ONE TABLET (10 MG DOSE) BY MOUTH DAILY. 11/11/18   [provider]  metoprolol succinate (TOPROL-XL) 50 MG 24 hr tablet Take 1 tablet (50 mg total) by mouth daily. Take with or immediately following a meal. Patient not taking: Reported on 12/05/2018 10/22/18   [name], MD  REMIFEMIN 20 MG TABS Take 1 tablet (20 mg total) by mouth daily. Patient not taking: Reported on 12/11/2018 10/22/18   [name], MD    Family History Family History  Problem Relation Age of Onset  . Arthritis Mother   . Diabetes Mother   . CVA Mother   . Stroke Mother   . Diabetes  Father   . Heart disease Father        CABG.  Does not know age of onset  . Asthma Father   . Hepatitis C Sister   . Arthritis Brother   . Cancer Brother        metastic cancer  . Peripheral Artery Disease Daughter   . Arthritis-Osteo Son     Social History Social History   Tobacco Use  . Smoking status: Never Smoker  . Smokeless tobacco: Never Used  Substance Use Topics  . Alcohol use: No  . Drug use: No     Allergies   Penicillins; Gabapentin; Lisinopril; and Soap   Review of Systems Review of Systems  Constitutional: Negative for appetite change and fatigue.  HENT: Negative for congestion, ear  discharge and sinus pressure.   Eyes: Negative for discharge.  Respiratory: Negative for cough.   Cardiovascular: Negative for chest pain.  Gastrointestinal: Negative for abdominal pain and diarrhea.  Genitourinary: Negative for frequency and hematuria.  Musculoskeletal: Negative for back pain.  Skin: Negative for rash.  Neurological: Positive for weakness. Negative for seizures and headaches.       Numbness in her right hand and right foot  Psychiatric/Behavioral: Negative for hallucinations.     Physical Exam Updated Vital Signs BP (!) 170/82   Pulse 71   Temp 98.7 F (37.1 C) (Oral)   Resp (!) 22   SpO2 99%   Physical Exam Vitals signs and nursing note reviewed.  Constitutional:      Appearance: She is well-developed.  HENT:     Head: Normocephalic.     Nose: Nose normal.  Eyes:     General: No scleral icterus.    Conjunctiva/sclera: Conjunctivae normal.  Neck:     Musculoskeletal: Neck supple.     Thyroid: No thyromegaly.  Cardiovascular:     Rate and Rhythm: Normal rate and regular rhythm.     Heart sounds: No murmur. No friction rub. No gallop.   Pulmonary:     Breath sounds: No stridor. No wheezing or rales.  Chest:     Chest wall: No tenderness.  Abdominal:     General: There is no distension.     Tenderness: There is no abdominal  tenderness. There is no rebound.  Musculoskeletal: Normal range of motion.  Lymphadenopathy:     Cervical: No cervical adenopathy.  Skin:    Findings: No erythema or rash.  Neurological:     Mental Status: She is alert and oriented to person, place, and time.     Motor: No abnormal muscle tone.     Coordination: Coordination normal.  Psychiatric:        Behavior: Behavior normal.      ED Treatments / Results  Labs (all labs ordered are listed, but only abnormal results are displayed) Labs Reviewed  COMPREHENSIVE METABOLIC PANEL - Abnormal; Notable for the following components:      Result Value   Potassium 3.0 (*)    Glucose, Bld 222 (*)    Calcium 8.8 (*)    All other components within normal limits  URINALYSIS, ROUTINE W REFLEX MICROSCOPIC - Abnormal; Notable for the following components:   Glucose, UA 50 (*)    Hgb urine dipstick SMALL (*)    Leukocytes, UA TRACE (*)    Bacteria, UA MANY (*)    All other components within normal limits  CBG MONITORING, ED - Abnormal; Notable for the following components:   Glucose-Capillary 194 (*)    All other components within normal limits  I-STAT CHEM 8, ED - Abnormal; Notable for the following components:   Potassium 3.0 (*)    Glucose, Bld 217 (*)    All other components within normal limits  URINE CULTURE  ETHANOL  PROTIME-INR  APTT  CBC  DIFFERENTIAL  RAPID URINE DRUG SCREEN, HOSP PERFORMED  MAGNESIUM  PHOSPHORUS  I-STAT TROPONIN, ED    EKG None  Radiology Ct Head Wo Contrast  Result Date: 12/11/2018 CLINICAL DATA:  Altered level of consciousness, right-sided weakness, right foot tingling EXAM: CT HEAD WITHOUT CONTRAST TECHNIQUE: Contiguous axial images were obtained from the base of the skull through the vertex without intravenous contrast. COMPARISON:  MRI brain dated 06/06/2018 FINDINGS: Brain: No evidence of acute infarction, hemorrhage, hydrocephalus, extra-axial collection  or mass lesion/mass effect. Mild  subcortical white matter and periventricular small vessel ischemic changes. Vascular: Intracranial atherosclerosis. Skull: Normal. Negative for fracture or focal lesion. Sinuses/Orbits: The visualized paranasal sinuses are essentially clear. The mastoid air cells are unopacified. Other: None. IMPRESSION: No evidence of acute intracranial abnormality. Mild small vessel ischemic changes. Electronically Signed   By: Julian Hy M.D.   On: 12/11/2018 22:14    Procedures Procedures (including critical care time)  Medications Ordered in ED Medications  potassium chloride SA (K-DUR,KLOR-CON) CR tablet 40 mEq (40 mEq Oral Given 12/11/18 2258)     Initial Impression / Assessment and Plan / ED Course  I have reviewed the triage vital signs and the nursing notes.  Pertinent labs & imaging results that were available during my care of the patient were reviewed by me and considered in my medical decision making (see chart for details).     CRITICAL CARE Performed by: Milton Ferguson Total critical care time: 40 minutes Critical care time was exclusive of separately billable procedures and treating other patients. Critical care was necessary to treat or prevent imminent or life-threatening deterioration. Critical care was time spent personally by me on the following activities: development of treatment plan with patient and/or surrogate as well as nursing, discussions with consultants, evaluation of patient's response to treatment, examination of patient, obtaining history from patient or surrogate, ordering and performing treatments and interventions, ordering and review of laboratory studies, ordering and review of radiographic studies, pulse oximetry and re-evaluation of patient's condition.  Patient was seen by neurology and it was felt the patient should get an MRI tomorrow.  She will be admitted to medicine Final Clinical Impressions(s) / ED Diagnoses   Final diagnoses:  TIA (transient  ischemic attack)    ED Discharge Orders    None       Milton Ferguson, MD 12/11/18 2307

## 2018-12-11 NOTE — ED Notes (Signed)
CODE STROKE CANCELLED. Tele neurologist consult order

## 2018-12-11 NOTE — ED Notes (Signed)
edp aware.

## 2018-12-11 NOTE — ED Triage Notes (Addendum)
Pt c/o right foot tingling started today with right sided weakness LKW 11am. Hx of this with last stroke. Pt a/o, ambulatory with steady gait. Arm drift neg. Smile symmetrical. Pt also c/o chest heaviness x 3 days.

## 2018-12-11 NOTE — ED Notes (Signed)
edp at bedside. Weakness noted to right leg/arm

## 2018-12-11 NOTE — ED Notes (Signed)
Pt states she has not had head ct yet, ct was called and pt is next for ct

## 2018-12-11 NOTE — H&P (Signed)
History and Physical    Yvonne [name] MLY:[id number] DOB: December 09, 1950 DOA: 12/11/2018  PCP: [name], MD   Patient coming from: Home.  I have personally briefly reviewed patient's old medical records in [place]  Chief Complaint: Right-sided weakness.  HPI: Yvonne [name] is a 68 y.o. female with medical history significant of anxiety, depression, osteoarthritis, asthma, chronic cough, chronic otitis media, GERD, history of CVA, hyperlipidemia, hypertension, type 2 diabetes, overactive bladder, recurrent superlative otitis media who is coming to the emergency department with complaints of worsening baseline right-sided weakness since about 11 in the morning.  She denies headache, vision changes, dizziness, slurred speech or language comprehension inability.  She described having some chest tightness and anxiety earlier, but denies diaphoresis, dyspnea, PND, orthopnea or recent pitting edema of the lower extremities.  She mentions having palpitations yesterday.  She gets palpitations on occasion.  She denies abdominal pain, nausea, emesis, diarrhea, constipation, melena or hematochezia.  No dysuria, frequency or hematuria.  No polyuria, polydipsia, polyphagia or blurred vision.  ED Course: Initial vital signs temperature 98.7 F, pulse 77, respirations 18, blood pressure 180/96 mmHg and O2 sat 99% on room air.  Her urinalysis showed trace leukocyte esterase with many bacteria, 50 mg/dL of glucosuria and small hemoglobinuria, but no other finding.  UDS and alcohol negative.  CBC, PT/INR/PTT were normal.  Her CMP shows a potassium of 3.0 mmol/L.  Her glucose is 222 and calcium 8.8 mg/dL.  All other tests are normal.  Calcium is normal when corrected to an albumin of 3.5 g/dL.  Troponin was normal.  Her chest radiograph and CT of the head did not show any acute abnormality  Review of Systems: As per HPI otherwise 10 point review of systems negative.   Past Medical History:  Diagnosis Date   . Anxiety   . Arthritis    left hand  . Asthma    daily and prn inhalers  . Chronic cough   . Chronic otitis media 12/2017  . Full dentures   . GERD (gastroesophageal reflux disease)   . History of stroke 09/2017   weakness right hand, numbness right side face  . Hyperlipidemia   . Hypertension    states under control with meds., has been on med. x a long time, per pt.  . Non-insulin dependent type 2 diabetes mellitus ([facility])   . Overactive bladder   . Recurrent acute suppurative otitis media without spontaneous rupture of left tympanic membrane 02/19/2018  . Transient cerebral ischemia     Past Surgical History:  Procedure Laterality Date  . ABDOMINAL HYSTERECTOMY     complete  . CATARACT EXTRACTION W/ INTRAOCULAR LENS IMPLANT Right   . CHOLECYSTECTOMY    . COLONOSCOPY N/A 08/17/2015   Procedure: COLONOSCOPY;  Surgeon: Rogene Houston, MD;  Location: [facility];  Service: Endoscopy;  Laterality: N/A;  200 - moved to 7:30 - Ann notified pt  . GAS INSERTION Right    x 2 - eye  . LACRIMAL DUCT EXPLORATION Bilateral    removal of tear ducts  . MYRINGOTOMY WITH TUBE PLACEMENT Bilateral 01/28/2018   Procedure: BILATERAL MYRINGOTOMY WITH TUBE PLACEMENT;  Surgeon: Leta Baptist, MD;  Location: [place];  Service: ENT;  Laterality: Bilateral;  . VITRECTOMY Left 09/14/2015     reports that she has never smoked. She has never used smokeless tobacco. She reports that she does not drink alcohol or use drugs.  Allergies  Allergen Reactions  . Penicillins  Hives    Has patient had a PCN reaction causing immediate rash, facial/tongue/throat swelling, SOB or lightheadedness with hypotension: Yes Has patient had a PCN reaction causing severe rash involving mucus membranes or skin necrosis: Unk Has patient had a PCN reaction that required hospitalization: No Has patient had a PCN reaction occurring within the last 10 years: No If all of the above answers are "NO", then may  proceed with Cephalosporin use.   . Gabapentin Other (See Comments)    Causes a lot of lethargy at a higher dose  . Lisinopril Cough  . Soap Rash    DIAL soap    Family History  Problem Relation Age of Onset  . Arthritis Mother   . Diabetes Mother   . CVA Mother   . Stroke Mother   . Diabetes Father   . Heart disease Father        CABG.  Does not know age of onset  . Asthma Father   . Hepatitis C Sister   . Arthritis Brother   . Cancer Brother        metastic cancer  . Peripheral Artery Disease Daughter   . Arthritis-Osteo Son    Prior to Admission medications   Medication Sig Start Date End Date Taking? Authorizing Provider  albuterol (VENTOLIN HFA) 108 (90 Base) MCG/ACT inhaler Inhale 2 puffs into the lungs every 6 (six) hours as needed for wheezing or shortness of breath. 10/22/18  Yes [name], [name], MD  ALPRAZolam Duanne Moron) 1 MG tablet TAKE 1 TABLET (1MG  TOTAL) 3 (THREE)TIMES DAILY BY MOUTH Patient taking differently: Take 1 mg by mouth 3 (three) times daily.  12/05/18  Yes [name], [name], MD  amLODipine (NORVASC) 10 MG tablet TAKE 1 TABLET (5 MG TOTAL) BY MOUTH DAILY. Patient taking differently: Take 10 mg by mouth every morning.  10/22/18  Yes [name], MD  aspirin EC 81 MG tablet Take 81 mg by mouth every morning.    Yes [provider]  atorvastatin (LIPITOR) 40 MG tablet TAKE 1 TABLET EVERY DAY AT 6PM Patient taking differently: Take 40 mg by mouth every evening.  10/22/18  Yes [name], [name], MD  ezetimibe (ZETIA) 10 MG tablet Take 1 tablet (10 mg total) by mouth daily. For cholesterol Patient taking differently: Take 10 mg by mouth every evening. For cholesterol 10/22/18  Yes [name], [name], MD  fluticasone furoate-vilanterol (BREO ELLIPTA) 100-25 MCG/INH AEPB Inhale 1 puff into the lungs daily. 10/22/18  Yes [name], [name], MD  furosemide (LASIX) 20 MG tablet Take 1 tablet (20 mg total) by mouth daily. 10/22/18  Yes [name], [name], MD  loratadine  (CLARITIN) 10 MG tablet Take 10 mg by mouth daily.  11/11/18  Yes [provider]  metFORMIN (GLUCOPHAGE-XR) 750 MG 24 hr tablet Take 1 tablet (750 mg total) by mouth 2 (two) times daily. 12/05/18  Yes [name], MD  pantoprazole (PROTONIX) 40 MG tablet Take 1 tablet (40 mg total) by mouth 2 (two) times daily. For stomach 10/22/18  Yes [name], [name], MD  BELBUCA 150 MCG FILM Place 150 mcg under the tongue 2 (two) times daily.  11/22/18   [provider]  metoprolol succinate (TOPROL-XL) 50 MG 24 hr tablet Take 1 tablet (50 mg total) by mouth daily. Take with or immediately following a meal. Patient not taking: Reported on 12/05/2018 10/22/18   [name], MD  REMIFEMIN 20 MG TABS Take 1 tablet (20 mg total) by mouth daily. Patient not taking: Reported on 12/11/2018 10/22/18  [name], MD    Physical Exam: Vitals:   12/11/18 2030 12/11/18 2100 12/11/18 2200 12/11/18 2230  BP: (!) 162/77 (!) 164/98 (!) 179/70 (!) 170/82  Pulse: 72 72 69 71  Resp: 20 (!) 22 18 (!) 22  Temp:      TempSrc:      SpO2:        Constitutional: NAD, calm, comfortable Eyes: PERRL, lids and conjunctivae normal ENMT: Mucous membranes are moist. Posterior pharynx clear of any exudate or lesions. Neck: normal, supple, no masses, no thyromegaly Respiratory: clear to auscultation bilaterally, no wheezing, no crackles. Normal respiratory effort. No accessory muscle use.  Cardiovascular: Regular rate and rhythm, no murmurs / rubs / gallops. No extremity edema. 2+ pedal pulses. No carotid bruits.  Abdomen: Obese, soft, no tenderness, no masses palpated. No hepatosplenomegaly. Bowel sounds positive.  Musculoskeletal: no clubbing / cyanosis. Good ROM, no contractures. Normal muscle tone.  Skin: Small hyperpigmented macules and verrucous growth on upper back. Neurologic: CN 2-12 grossly intact. Sensation intact, DTR normal.  4/5 right-sided hemiparesis. Psychiatric: Normal judgment and  insight. Alert and oriented x 3. Normal mood.   Labs on Admission: I have personally reviewed following labs and imaging studies  CBC: Recent Labs  Lab 12/11/18 1955 12/11/18 2007  WBC 6.9  --   NEUTROABS 3.7  --   HGB 12.4 12.9  HCT 38.1 38.0  MCV 85.2  --   PLT 231  --    Basic Metabolic Panel: Recent Labs  Lab 12/05/18 0849 12/11/18 1955 12/11/18 2007  NA 141 135 138  K 4.0 3.0* 3.0*  CL 102 100 100  CO2 23 28  --   GLUCOSE 138* 222* 217*  BUN 20 20 19   CREATININE 0.85 0.83 0.80  CALCIUM 9.3 8.8*  --   MG  --  2.1  --   PHOS  --  3.5  --    GFR: Estimated Creatinine Clearance: 66.3 mL/min (by C-G formula based on SCr of 0.8 mg/dL). Liver Function Tests: Recent Labs  Lab 12/05/18 0849 12/11/18 1955  AST 15 23  ALT 14 20  ALKPHOS 106 87  BILITOT 0.4 0.4  PROT 6.5 6.5  ALBUMIN 4.3 3.5   No results for input(s): LIPASE, AMYLASE in the last 168 hours. No results for input(s): AMMONIA in the last 168 hours. Coagulation Profile: Recent Labs  Lab 12/11/18 1955  INR 0.94   Cardiac Enzymes: No results for input(s): CKTOTAL, CKMB, CKMBINDEX, TROPONINI in the last 168 hours. BNP (last 3 results) No results for input(s): PROBNP in the last 8760 hours. HbA1C: No results for input(s): HGBA1C in the last 72 hours. CBG: Recent Labs  Lab 12/11/18 1937  GLUCAP 194*   Lipid Profile: No results for input(s): CHOL, HDL, LDLCALC, TRIG, CHOLHDL, LDLDIRECT in the last 72 hours. Thyroid Function Tests: No results for input(s): TSH, T4TOTAL, FREET4, T3FREE, THYROIDAB in the last 72 hours. Anemia Panel: No results for input(s): VITAMINB12, FOLATE, FERRITIN, TIBC, IRON, RETICCTPCT in the last 72 hours. Urine analysis:    Component Value Date/Time   COLORURINE YELLOW 12/11/2018 2105   APPEARANCEUR CLEAR 12/11/2018 2105   APPEARANCEUR Cloudy (A) 10/22/2018 0913   LABSPEC 1.014 12/11/2018 2105   PHURINE 6.0 12/11/2018 2105   GLUCOSEU 50 (A) 12/11/2018 2105   HGBUR  SMALL (A) 12/11/2018 2105   BILIRUBINUR NEGATIVE 12/11/2018 2105   BILIRUBINUR Negative 10/22/2018 0913   KETONESUR NEGATIVE 12/11/2018 2105   PROTEINUR NEGATIVE 12/11/2018 2105   NITRITE  NEGATIVE 12/11/2018 2105   LEUKOCYTESUR TRACE (A) 12/11/2018 2105   LEUKOCYTESUR 1+ (A) 10/22/2018 0913    Radiological Exams on Admission: Ct Head Wo Contrast  Result Date: 12/11/2018 CLINICAL DATA:  Altered level of consciousness, right-sided weakness, right foot tingling EXAM: CT HEAD WITHOUT CONTRAST TECHNIQUE: Contiguous axial images were obtained from the base of the skull through the vertex without intravenous contrast. COMPARISON:  MRI brain dated 06/06/2018 FINDINGS: Brain: No evidence of acute infarction, hemorrhage, hydrocephalus, extra-axial collection or mass lesion/mass effect. Mild subcortical white matter and periventricular small vessel ischemic changes. Vascular: Intracranial atherosclerosis. Skull: Normal. Negative for fracture or focal lesion. Sinuses/Orbits: The visualized paranasal sinuses are essentially clear. The mastoid air cells are unopacified. Other: None. IMPRESSION: No evidence of acute intracranial abnormality. Mild small vessel ischemic changes. Electronically Signed   By: Julian Hy M.D.   On: 12/11/2018 22:14   03/22/2017 echocardiogram ------------------------------------------------------------------- LV EF: 60% -   65%  ------------------------------------------------------------------- Indications:      CVA 436.  ------------------------------------------------------------------- History:   PMH:   Congestive heart failure.  Risk factors: Diabetes mellitus.  ------------------------------------------------------------------- Study Conclusions  - Left ventricle: The cavity size was normal. Wall thickness was   increased in a pattern of mild LVH. Systolic function was normal.   The estimated ejection fraction was in the range of 60% to 65%.   Wall motion  was normal; there were no regional wall motion   abnormalities. Doppler parameters are consistent with abnormal   left ventricular relaxation (grade 1 diastolic dysfunction).  EKG: Independently reviewed.  Vent. rate 76 BPM PR interval * ms QRS duration 96 ms QT/QTc 431/485 ms P-R-T axes 9 -9 53 Sinus rhythm Low voltage, precordial leads Borderline T abnormalities, anterior leads When compared to previous QT prolongation is absent now.  Assessment/Plan Principal Problem:   Right sided numbness Observation/telemetry. Frequent neuro checks. Swallow screen. PT/OT/SLP evaluation. Check fasting lipids and hemoglobin A1c. Check echocardiogram and carotid Doppler. Check MR MRA of brain. Consult neurology.  Active Problems:   Hypertension Allow permissive hypertension. Hold amlodipine and furosemide. Monitor blood pressure.    Acid reflux disease Protonix 40 mg p.o. twice daily.    Type 2 diabetes mellitus (HCC) Carbohydrate modified diet. CBG monitoring with regular insulin sliding scale. Check hemoglobin A1c.    Hyperlipidemia with target LDL less than 70 Check fasting lipids. Continue atorvastatin 40 mg p.o. daily.    Chronic diastolic CHF (congestive heart failure) (HCC) No signs of decompensation at this time. Hold furosemide for now.    Hypokalemia Replaced. Follow-up potassium level.   DVT prophylaxis: Lovenox SQ. Code Status: Full code. Family Communication: Her spouse was present in the room. Disposition Plan: Observation for TIA/CVA monitoring and work-up. Consults called: Routine neurology consult. Admission status: Observation/telemetry.   Reubin Milan MD [facility]  12/11/2018, 11:31 PM

## 2018-12-12 ENCOUNTER — Observation Stay (HOSPITAL_BASED_OUTPATIENT_CLINIC_OR_DEPARTMENT_OTHER): Payer: Medicare HMO

## 2018-12-12 ENCOUNTER — Observation Stay (HOSPITAL_COMMUNITY): Payer: Medicare HMO

## 2018-12-12 ENCOUNTER — Other Ambulatory Visit: Payer: Self-pay

## 2018-12-12 DIAGNOSIS — E876 Hypokalemia: Secondary | ICD-10-CM | POA: Diagnosis not present

## 2018-12-12 DIAGNOSIS — K219 Gastro-esophageal reflux disease without esophagitis: Secondary | ICD-10-CM

## 2018-12-12 DIAGNOSIS — I10 Essential (primary) hypertension: Secondary | ICD-10-CM

## 2018-12-12 DIAGNOSIS — I6523 Occlusion and stenosis of bilateral carotid arteries: Secondary | ICD-10-CM | POA: Diagnosis not present

## 2018-12-12 DIAGNOSIS — I5032 Chronic diastolic (congestive) heart failure: Secondary | ICD-10-CM

## 2018-12-12 DIAGNOSIS — G43109 Migraine with aura, not intractable, without status migrainosus: Secondary | ICD-10-CM

## 2018-12-12 DIAGNOSIS — E785 Hyperlipidemia, unspecified: Secondary | ICD-10-CM

## 2018-12-12 DIAGNOSIS — G43709 Chronic migraine without aura, not intractable, without status migrainosus: Secondary | ICD-10-CM | POA: Diagnosis not present

## 2018-12-12 DIAGNOSIS — R200 Anesthesia of skin: Secondary | ICD-10-CM | POA: Diagnosis not present

## 2018-12-12 DIAGNOSIS — G459 Transient cerebral ischemic attack, unspecified: Secondary | ICD-10-CM

## 2018-12-12 DIAGNOSIS — G9389 Other specified disorders of brain: Secondary | ICD-10-CM | POA: Diagnosis not present

## 2018-12-12 DIAGNOSIS — G8191 Hemiplegia, unspecified affecting right dominant side: Secondary | ICD-10-CM | POA: Diagnosis not present

## 2018-12-12 LAB — GLUCOSE, CAPILLARY
Glucose-Capillary: 165 mg/dL — ABNORMAL HIGH (ref 70–99)
Glucose-Capillary: 179 mg/dL — ABNORMAL HIGH (ref 70–99)

## 2018-12-12 LAB — ECHOCARDIOGRAM COMPLETE
Height: 62 in
Weight: 2839.52 oz

## 2018-12-12 LAB — LIPID PANEL
Cholesterol: 210 mg/dL — ABNORMAL HIGH (ref 0–200)
HDL: 47 mg/dL (ref >40)
LDL Cholesterol: 141 mg/dL — ABNORMAL HIGH (ref 0–99)
TRIGLYCERIDES: 111 mg/dL (ref <150)
Total CHOL/HDL Ratio: 4.5 RATIO
VLDL: 22 mg/dL (ref 0–40)

## 2018-12-12 LAB — BASIC METABOLIC PANEL
Anion gap: 6 (ref 5–15)
BUN: 15 mg/dL (ref 8–23)
CO2: 26 mmol/L (ref 22–32)
Calcium: 8.6 mg/dL — ABNORMAL LOW (ref 8.9–10.3)
Chloride: 107 mmol/L (ref 98–111)
Creatinine, Ser: 0.69 mg/dL (ref 0.44–1.00)
GFR calc Af Amer: >60 mL/min (ref >60)
GFR calc non Af Amer: >60 mL/min (ref >60)
Glucose, Bld: 145 mg/dL — ABNORMAL HIGH (ref 70–99)
Potassium: 3.2 mmol/L — ABNORMAL LOW (ref 3.5–5.1)
Sodium: 139 mmol/L (ref 135–145)

## 2018-12-12 LAB — HEMOGLOBIN A1C
Hgb A1c MFr Bld: 7.7 % — ABNORMAL HIGH (ref 4.8–5.6)
Mean Plasma Glucose: 174.29 mg/dL

## 2018-12-12 MED ORDER — TOPIRAMATE 25 MG PO TABS: ORAL_TABLET | ORAL | 2 refills | Status: DC

## 2018-12-12 MED ORDER — POTASSIUM CHLORIDE CRYS ER 20 MEQ PO TBCR
40.0000 meq | EXTENDED_RELEASE_TABLET | Freq: Once | ORAL | Status: AC
  Administered 2018-12-12: 40 meq via ORAL
  Filled 2018-12-12: qty 2

## 2018-12-12 MED ORDER — ASPIRIN EC 325 MG PO TBEC: 325.0000 mg | DELAYED_RELEASE_TABLET | Freq: Every day | ORAL | 3 refills | Status: AC

## 2018-12-12 NOTE — Evaluation (Signed)
Physical Therapy Evaluation Patient Details Name: Yvonne [name] MRN: [id number] DOB: 1951/06/29 Today's Date: 12/12/2018   History of Present Illness  Yvonne [name] is a 68 y.o. female with medical history significant of anxiety, depression, osteoarthritis, asthma, chronic cough, chronic otitis media, GERD, history of CVA, hyperlipidemia, hypertension, type 2 diabetes, overactive bladder, recurrent superlative otitis media who is coming to the emergency department with complaints of worsening baseline right-sided weakness since about 11 in the morning.  She denies headache, vision changes, dizziness, slurred speech or language comprehension inability.  She described having some chest tightness and anxiety earlier, but denies diaphoresis, dyspnea, PND, orthopnea or recent pitting edema of the lower extremities.  She mentions having palpitations yesterday.  She gets palpitations on occasion.  She denies abdominal pain, nausea, emesis, diarrhea, constipation, melena or hematochezia.  No dysuria, frequency or hematuria.  No polyuria, polydipsia, polyphagia or blurred vision.    Clinical Impression  Patient presents supine in bed with spouse at bedside. Patient at baseline demonstrating independence with bed mobility, transfers, and ambulation without assistive device, without fatigue, no unsteadiness noted, and does not require extra time to complete tasks. Patient verbalizes her numbness and weakness symptoms have fully resolved and she feels like she is back to "normal".  Patient does not need physical therapy at this time and educated to ambulate with spouse and nursing daily as tolerated for length of stay.   Follow Up Recommendations No PT follow up    Equipment Recommendations  None recommended by PT    Recommendations for Other Services       Precautions / Restrictions Precautions Precautions: None Restrictions Weight Bearing Restrictions: No      Mobility  Bed Mobility Overal bed  mobility: Independent                Transfers Overall transfer level: Independent Equipment used: None                Ambulation/Gait Ambulation/Gait assistance: Independent Gait Distance (Feet): 200 Feet Assistive device: None Gait Pattern/deviations: WFL(Within Functional Limits)     General Gait Details: able to ascend/descend ramp surfaces, no loss of balance and no fatigue  Stairs            Wheelchair Mobility    Modified Rankin (Stroke Patients Only)       Balance Overall balance assessment: Independent Sitting-balance support: Feet supported;No upper extremity supported Sitting balance-Leahy Scale: Good     Standing balance support: During functional activity;No upper extremity supported Standing balance-Leahy Scale: Good Standing balance comment: no unsteadiness noted or loss of balance with transfers or ambulation                             Pertinent Vitals/Pain Pain Assessment: No/denies pain    Home Living Family/patient expects to be discharged to:: Private residence Living Arrangements: Spouse/significant other;Children;Other relatives Available Help at Discharge: Family;Available 24 hours/day Type of Home: House Home Access: Stairs to enter Entrance Stairs-Rails: Right;Left;Can reach both Entrance Stairs-Number of Steps: 7 Home Layout: One level(1-2 steps to get into kitchen and bathroom) Home Equipment: None Additional Comments: Patient reports she has multiple children, grandchildren, and additional family members living with her and her spouse.    Prior Function Level of Independence: Independent         Comments: Patient is independent with ADLs and shopping, community ambulator without assistive device, cares for family members, cares for pets, drives  Hand Dominance   Dominant Hand: Right    Extremity/Trunk Assessment   Upper Extremity Assessment Upper Extremity Assessment: Overall WFL for tasks  assessed    Lower Extremity Assessment Lower Extremity Assessment: Overall WFL for tasks assessed    Cervical / Trunk Assessment Cervical / Trunk Assessment: Normal  Communication   Communication: No difficulties  Cognition Arousal/Alertness: Awake/alert Behavior During Therapy: WFL for tasks assessed/performed Overall Cognitive Status: Within Functional Limits for tasks assessed                                        General Comments      Exercises     Assessment/Plan    PT Assessment Patent does not need any further PT services  PT Problem List         PT Treatment Interventions      PT Goals (Current goals can be found in the Care Plan section)  Acute Rehab PT Goals Patient Stated Goal: home with family PT Goal Formulation: With patient Time For Goal Achievement: 12/12/18 Potential to Achieve Goals: Good    Frequency     Barriers to discharge        Co-evaluation               AM-PAC PT "6 Clicks" Mobility  Outcome Measure Help needed turning from your back to your side while in a flat bed without using bedrails?: None Help needed moving from lying on your back to sitting on the side of a flat bed without using bedrails?: None Help needed moving to and from a bed to a chair (including a wheelchair)?: None Help needed standing up from a chair using your arms (e.g., wheelchair or bedside chair)?: None Help needed to walk in hospital room?: None Help needed climbing 3-5 steps with a railing? : None 6 Click Score: 24    End of Session   Activity Tolerance: Patient tolerated treatment well Patient left: in bed;with family/visitor present;Other (comment)(ECHO in room preparing to perform)        Time: [phone] PT Time Calculation (min) (ACUTE ONLY): 10 min   Charges:   PT Evaluation $PT Eval Low Complexity: 1 Low PT Treatments $Gait Training: 8-22 mins        9:52 AM, 12/12/18 Talbot Grumbling, PT, DPT Physical Therapist  with [facility] [phone] mobile phone

## 2018-12-12 NOTE — Consult Note (Signed)
Ribera A. Merlene Laughter, MD     [email or web address]          Yvonne [name] is an 68 y.o. female.   ASSESSMENT/PLAN: 1.  Recurrent episodes of right-sided weakness/numbness associated with headaches: The differential diagnosis includes recrudescence of previous infarct (she may have low-grade UTI on urinalysis but culture is pending), TIA, complex migraine/migraine with aura, seizures and conversion disorder.  I think the most likely etiology is migraine with aura/complex migraine.  Consequently, I think we should try the patient on migraine prophylaxis.  I think we can retry Topamax but push the dose higher to at least 100 mg.  We will start at 25 mg and gradually increase the dose.  She is to continue with aspirin antiplatelet agents although I think that there should be at 325.  2.  Remote left thalamic infarct: I guess continue with aspirin but increase to 325.  Agree with the initiation of a statin medication given the elevated LDL.  3.  Diabetic polyneuropathy:    The patient is a 68 year old right-handed white female who presents with right-sided weakness.  The entire right side was involved and associated with numbness and tingling.  It is apparent that she has had multiple episodes of right-sided weakness sometime associated with headaches.  She is seen previous neurologist.  She last saw Dr. Tomi Likens in his office in [place].  She has been worked up extensively and has had multiple imaging.  I have counted 6 brain imaging mostly MRI/MRA.  The results of all been unchanged.  Current scan is also unchanged from the previous scans.  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  EEG has been done 6 months ago and was unrevealing.  She reported the current episode lasted for about 20 minutes.  It was not associated with dysarthria, dysphagia or alteration of consciousness.  Previous events have lasted 4 days however.   The patient reports that she did have a headache with this event.  The headache was in the posterior occipital region.  She does have a history of episodic headaches and in fact has a diagnosis of migraine headaches.  She tells me she gets severe migraines about once every 2 months.  However, detailed questioning revealed that she does have more frequent mildly headaches.  She tells me she has had about 15 headache days in the last 30 days.  She has been on Topamax in the past but only low dose.  She tells me that it does not work but has not tried it since that time.  Her outpatient neurologist wanted her to try a Inderal but he left this up to her PCP.  She reports that she is back at baseline at this time.  The review of systems otherwise negative.   GENERAL: This is a pleasant female who is in no acute distress.  HEENT: Supple and no evidence of trauma.  ABDOMEN: soft  EXTREMITIES: No edema   BACK: This is normal  SKIN: Normal by inspection.    MENTAL STATUS: Alert and oriented -including orientation to the month and her age. Speech, language and cognition are generally intact. Judgment and insight normal.   CRANIAL NERVES: Pupils are equal, round and reactive to light and accomodation; extra ocular movements are full, there is no significant nystagmus; visual fields are full; upper and lower facial muscles are normal in strength and symmetric, there is no flattening of the nasolabial folds;  tongue is midline; uvula is midline; shoulder elevation is normal.  MOTOR: There is a mild drift of the right upper extremity and right leg.  Direct strength of the right upper extremity is normal.  There is some mild hip flexion weakness on the right graded as 4+/5.  Dorsiflexion is 5.  The left side shows normal tone, bulk and strength.  There is no drift on the left side.    COORDINATION: Left finger to nose is normal, right finger to nose is normal, No rest tremor; no intention tremor; no postural  tremor; no bradykinesia.  REFLEXES: Deep tendon reflexes are symmetrical but diminished in the legs. Plantar reflexes are extensor bilaterally.   SENSATION: Normal to light touch, temperature, and pain.  There is no extinction on double simultaneous stimulation.   NIH stroke scale 1, 1 equals 2   Blood pressure (!) 154/62, pulse 63, temperature 98.6 F (37 C), resp. rate 19, height 5\' 2"  (1.575 m), weight 80.5 kg, SpO2 98 %.  Past Medical History:  Diagnosis Date  . Anxiety   . Arthritis    left hand  . Asthma    daily and prn inhalers  . Chronic cough   . Chronic otitis media 12/2017  . Full dentures   . GERD (gastroesophageal reflux disease)   . History of stroke 09/2017   weakness right hand, numbness right side face  . Hyperlipidemia   . Hypertension    states under control with meds., has been on med. x a long time, per pt.  . Non-insulin dependent type 2 diabetes mellitus ([place])   . Overactive bladder   . Recurrent acute suppurative otitis media without spontaneous rupture of left tympanic membrane 02/19/2018  . Transient cerebral ischemia     Past Surgical History:  Procedure Laterality Date  . ABDOMINAL HYSTERECTOMY     complete  . CATARACT EXTRACTION W/ INTRAOCULAR LENS IMPLANT Right   . CHOLECYSTECTOMY    . COLONOSCOPY N/A 08/17/2015   Procedure: COLONOSCOPY;  Surgeon: Rogene Houston, MD;  [facility];  Service: Endoscopy;  Laterality: N/A;  200 - moved to 7:30 - Ann notified pt  . GAS INSERTION Right    x 2 - eye  . LACRIMAL DUCT EXPLORATION Bilateral    removal of tear ducts  . MYRINGOTOMY WITH TUBE PLACEMENT Bilateral 01/28/2018   Procedure: BILATERAL MYRINGOTOMY WITH TUBE PLACEMENT;  Surgeon: Leta Baptist, MD;  Location: [place];  Service: ENT;  Laterality: Bilateral;  . VITRECTOMY Left 09/14/2015    Family History  Problem Relation Age of Onset  . Arthritis Mother   . Diabetes Mother   . CVA Mother   . Stroke Mother   .  Diabetes Father   . Heart disease Father        CABG.  Does not know age of onset  . Asthma Father   . Hepatitis C Sister   . Arthritis Brother   . Cancer Brother        metastic cancer  . Peripheral Artery Disease Daughter   . Arthritis-Osteo Son     Social History:  reports that she has never smoked. She has never used smokeless tobacco. She reports that she does not drink alcohol or use drugs.  Allergies:  Allergies  Allergen Reactions  . Penicillins Hives    Has patient had a PCN reaction causing immediate rash, facial/tongue/throat swelling, SOB or lightheadedness with hypotension: Yes Has patient had a PCN reaction causing severe rash  involving mucus membranes or skin necrosis: Unk Has patient had a PCN reaction that required hospitalization: No Has patient had a PCN reaction occurring within the last 10 years: No If all of the above answers are "NO", then may proceed with Cephalosporin use.   . Gabapentin Other (See Comments)    Causes a lot of lethargy at a higher dose  . Lisinopril Cough  . Soap Rash    DIAL soap    Medications: Prior to Admission medications   Medication Sig Start Date End Date Taking? Authorizing Provider  albuterol (VENTOLIN HFA) 108 (90 Base) MCG/ACT inhaler Inhale 2 puffs into the lungs every 6 (six) hours as needed for wheezing or shortness of breath. 10/22/18  Yes [name], [name], MD  ALPRAZolam Duanne Moron) 1 MG tablet TAKE 1 TABLET (1MG  TOTAL) 3 (THREE)TIMES DAILY BY MOUTH Patient taking differently: Take 1 mg by mouth 3 (three) times daily.  12/05/18  Yes [name], [name], MD  amLODipine (NORVASC) 10 MG tablet TAKE 1 TABLET (5 MG TOTAL) BY MOUTH DAILY. Patient taking differently: Take 10 mg by mouth every morning.  10/22/18  Yes [name], MD  aspirin EC 81 MG tablet Take 81 mg by mouth every morning.    Yes [provider]  atorvastatin (LIPITOR) 40 MG tablet TAKE 1 TABLET EVERY DAY AT 6PM Patient taking differently: Take 40 mg by  mouth every evening.  10/22/18  Yes [name], [name], MD  ezetimibe (ZETIA) 10 MG tablet Take 1 tablet (10 mg total) by mouth daily. For cholesterol Patient taking differently: Take 10 mg by mouth every evening. For cholesterol 10/22/18  Yes [name], [name], MD  fluticasone furoate-vilanterol (BREO ELLIPTA) 100-25 MCG/INH AEPB Inhale 1 puff into the lungs daily. 10/22/18  Yes [name], [name], MD  furosemide (LASIX) 20 MG tablet Take 1 tablet (20 mg total) by mouth daily. 10/22/18  Yes [name], [name], MD  loratadine (CLARITIN) 10 MG tablet Take 10 mg by mouth daily.  11/11/18  Yes [provider]  metFORMIN (GLUCOPHAGE-XR) 750 MG 24 hr tablet Take 1 tablet (750 mg total) by mouth 2 (two) times daily. 12/05/18  Yes [name], MD  pantoprazole (PROTONIX) 40 MG tablet Take 1 tablet (40 mg total) by mouth 2 (two) times daily. For stomach 10/22/18  Yes [name], [name], MD  BELBUCA 150 MCG FILM Place 150 mcg under the tongue 2 (two) times daily.  11/22/18   [provider]  metoprolol succinate (TOPROL-XL) 50 MG 24 hr tablet Take 1 tablet (50 mg total) by mouth daily. Take with or immediately following a meal. Patient not taking: Reported on 12/05/2018 10/22/18   [name], MD  REMIFEMIN 20 MG TABS Take 1 tablet (20 mg total) by mouth daily. Patient not taking: Reported on 12/11/2018 10/22/18   [name], MD    Scheduled Meds: . aspirin EC  81 mg Oral q morning - 10a  . atorvastatin  40 mg Oral QPM  . enoxaparin (LOVENOX) injection  40 mg Subcutaneous Q24H  . ezetimibe  10 mg Oral QPM  . fluticasone furoate-vilanterol  1 puff Inhalation Daily  . insulin aspart  0-15 Units Subcutaneous TID WC  . insulin aspart  0-5 Units Subcutaneous QHS  . loratadine  10 mg Oral Daily  . metFORMIN  750 mg Oral BID WC  . pantoprazole  40 mg Oral BID  . potassium chloride SA  40 mEq Oral Once   Continuous Infusions: PRN Meds:.acetaminophen **OR** acetaminophen (TYLENOL) oral liquid 160  mg/5 mL **OR**  acetaminophen, ALPRAZolam, ondansetron **OR** ondansetron (ZOFRAN) IV     Results for orders placed or performed during the hospital encounter of 12/11/18 (from the past 48 hour(s))  CBG monitoring, ED     Status: Abnormal   Collection Time: 12/11/18  7:37 PM  Result Value Ref Range   Glucose-Capillary 194 (H) 70 - 99 mg/dL   Comment 1 Notify RN    Comment 2 Document in Chart   Ethanol     Status: None   Collection Time: 12/11/18  7:55 PM  Result Value Ref Range   Alcohol, Ethyl (B) <10 <10 mg/dL    Comment: (NOTE) Lowest detectable limit for serum alcohol is 10 mg/dL. For medical purposes only. Performed at [facility], [address] [place], [place] [address]   Protime-INR     Status: None   Collection Time: 12/11/18  7:55 PM  Result Value Ref Range   Prothrombin Time 12.5 11.4 - 15.2 seconds   INR 0.94     Comment: Performed at [facility], [address], [place], [place] [address]  APTT     Status: None   Collection Time: 12/11/18  7:55 PM  Result Value Ref Range   aPTT 32 24 - 36 seconds    Comment: Performed at [facility], [address], [place], [place] [address]  CBC     Status: None   Collection Time: 12/11/18  7:55 PM  Result Value Ref Range   WBC 6.9 4.0 - 10.5 K/uL   RBC 4.47 3.87 - 5.11 MIL/uL   Hemoglobin 12.4 12.0 - 15.0 g/dL   HCT 38.1 36.0 - 46.0 %   MCV 85.2 80.0 - 100.0 fL   MCH 27.7 26.0 - 34.0 pg   MCHC 32.5 30.0 - 36.0 g/dL   RDW 13.1 11.5 - 15.5 %   Platelets 231 150 - 400 K/uL   nRBC 0.0 0.0 - 0.2 %    Comment: Performed at [facility], [address], [place], [place] [address]  Differential     Status: None   Collection Time: 12/11/18  7:55 PM  Result Value Ref Range   Neutrophils Relative % 54 %   Neutro Abs 3.7 1.7 - 7.7 K/uL   Lymphocytes Relative 38 %   Lymphs Abs 2.6 0.7 - 4.0 K/uL   Monocytes Relative 7 %   Monocytes Absolute 0.5 0.1 - 1.0 K/uL   Eosinophils Relative 1 %   Eosinophils Absolute 0.1 0.0 - 0.5  K/uL   Basophils Relative 0 %   Basophils Absolute 0.0 0.0 - 0.1 K/uL   Immature Granulocytes 0 %   Abs Immature Granulocytes 0.01 0.00 - 0.07 K/uL    Comment: Performed at [facility], [address] [place], [place] [address]  Comprehensive metabolic panel     Status: Abnormal   Collection Time: 12/11/18  7:55 PM  Result Value Ref Range   Sodium 135 135 - 145 mmol/L   Potassium 3.0 (L) 3.5 - 5.1 mmol/L   Chloride 100 98 - 111 mmol/L   CO2 28 22 - 32 mmol/L   Glucose, Bld 222 (H) 70 - 99 mg/dL   BUN 20 8 - 23 mg/dL   Creatinine, Ser 0.83 0.44 - 1.00 mg/dL   Calcium 8.8 (L) 8.9 - 10.3 mg/dL   Total Protein 6.5 6.5 - 8.1 g/dL   Albumin 3.5 3.5 - 5.0 g/dL   AST 23 15 - 41 U/L   ALT 20 0 - 44 U/L  Alkaline Phosphatase 87 38 - 126 U/L   Total Bilirubin 0.4 0.3 - 1.2 mg/dL   GFR calc non Af Amer >60 >60 mL/min   GFR calc Af Amer >60 >60 mL/min   Anion gap 7 5 - 15    Comment: Performed at [facility], [address], [place], [place] [address]  Magnesium     Status: None   Collection Time: 12/11/18  7:55 PM  Result Value Ref Range   Magnesium 2.1 1.7 - 2.4 mg/dL    Comment: Performed at [facility], [address], [place], [place] [address]  Phosphorus     Status: None   Collection Time: 12/11/18  7:55 PM  Result Value Ref Range   Phosphorus 3.5 2.5 - 4.6 mg/dL    Comment: Performed at [facility], [address] [place], [place] [address]  Hemoglobin A1c     Status: Abnormal   Collection Time: 12/11/18  7:55 PM  Result Value Ref Range   Hgb A1c MFr Bld 7.7 (H) 4.8 - 5.6 %    Comment: (NOTE) Pre diabetes:          5.7%-6.4% Diabetes:              >6.4% Glycemic control for   <7.0% adults with diabetes    Mean Plasma Glucose 174.29 mg/dL    Comment: Performed at [facility] [address], [place], [place] [address]  I-stat troponin, ED     Status: None   Collection Time: 12/11/18  8:05 PM  Result Value Ref Range   Troponin i, poc 0.01 0.00 - 0.08 ng/mL    Comment 3            Comment: Due to the release kinetics of cTnI, a negative result within the first hours of the onset of symptoms does not rule out myocardial infarction with certainty. If myocardial infarction is still suspected, repeat the test at appropriate intervals.   I-Stat Chem 8, ED     Status: Abnormal   Collection Time: 12/11/18  8:07 PM  Result Value Ref Range   Sodium 138 135 - 145 mmol/L   Potassium 3.0 (L) 3.5 - 5.1 mmol/L   Chloride 100 98 - 111 mmol/L   BUN 19 8 - 23 mg/dL   Creatinine, Ser 0.80 0.44 - 1.00 mg/dL   Glucose, Bld 217 (H) 70 - 99 mg/dL   Calcium, Ion 1.19 1.15 - 1.40 mmol/L   TCO2 27 22 - 32 mmol/L   Hemoglobin 12.9 12.0 - 15.0 g/dL   HCT 38.0 36.0 - 46.0 %  Urine rapid drug screen (hosp performed)     Status: None   Collection Time: 12/11/18  9:05 PM  Result Value Ref Range   Opiates NONE DETECTED NONE DETECTED   Cocaine NONE DETECTED NONE DETECTED   Benzodiazepines NONE DETECTED NONE DETECTED   Amphetamines NONE DETECTED NONE DETECTED   Tetrahydrocannabinol NONE DETECTED NONE DETECTED   Barbiturates NONE DETECTED NONE DETECTED    Comment: (NOTE) DRUG SCREEN FOR MEDICAL PURPOSES ONLY.  IF CONFIRMATION IS NEEDED FOR ANY PURPOSE, NOTIFY LAB WITHIN 5 DAYS. LOWEST DETECTABLE LIMITS FOR URINE DRUG SCREEN Drug Class                     Cutoff (ng/mL) Amphetamine and metabolites    1000 Barbiturate and metabolites    200 Benzodiazepine                 200  Tricyclics and metabolites     300 Opiates and metabolites        300 Cocaine and metabolites        300 THC                            50 Performed at [facility], [address], [place], [place] [address]   Urinalysis, Routine w reflex microscopic     Status: Abnormal   Collection Time: 12/11/18  9:05 PM  Result Value Ref Range   Color, Urine YELLOW YELLOW   APPearance CLEAR CLEAR   Specific Gravity, Urine 1.014 1.005 - 1.030   pH 6.0 5.0 - 8.0   Glucose, UA 50 (A) NEGATIVE mg/dL     Hgb urine dipstick SMALL (A) NEGATIVE   Bilirubin Urine NEGATIVE NEGATIVE   Ketones, ur NEGATIVE NEGATIVE mg/dL   Protein, ur NEGATIVE NEGATIVE mg/dL   Nitrite NEGATIVE NEGATIVE   Leukocytes, UA TRACE (A) NEGATIVE   RBC / HPF 0-5 0 - 5 RBC/hpf   WBC, UA 6-10 0 - 5 WBC/hpf   Bacteria, UA MANY (A) NONE SEEN   Squamous Epithelial / LPF 0-5 0 - 5    Comment: Performed at [facility], [address], [place], [place] [address]  Lipid panel     Status: Abnormal   Collection Time: 12/12/18  4:49 AM  Result Value Ref Range   Cholesterol 210 (H) 0 - 200 mg/dL   Triglycerides 111 <150 mg/dL   HDL 47 >40 mg/dL   Total CHOL/HDL Ratio 4.5 RATIO   VLDL 22 0 - 40 mg/dL   LDL Cholesterol 141 (H) 0 - 99 mg/dL    Comment:        Total Cholesterol/HDL:CHD Risk Coronary Heart Disease Risk Table                     Men   Women  1/2 Average Risk   3.4   3.3  Average Risk       5.0   4.4  2 X Average Risk   9.6   7.1  3 X Average Risk  23.4   11.0        Use the calculated Patient Ratio above and the CHD Risk Table to determine the patient's CHD Risk.        ATP III CLASSIFICATION (LDL):  <100     mg/dL   Optimal  100-129  mg/dL   Near or Above                    Optimal  130-159  mg/dL   Borderline  160-189  mg/dL   High  >190     mg/dL   Very High Performed at [facility] [place],  [address]   Basic metabolic panel     Status: Abnormal   Collection Time: 12/12/18  4:49 AM  Result Value Ref Range   Sodium 139 135 - 145 mmol/L   Potassium 3.2 (L) 3.5 - 5.1 mmol/L   Chloride 107 98 - 111 mmol/L   CO2 26 22 - 32 mmol/L   Glucose, Bld 145 (H) 70 - 99 mg/dL   BUN 15 8 - 23 mg/dL   Creatinine, Ser 0.69 0.44 - 1.00 mg/dL   Calcium 8.6 (L) 8.9 - 10.3 mg/dL   GFR calc non Af Amer >60 >60 mL/min   GFR calc Af Amer >60 >  60 mL/min   Anion gap 6 5 - 15    Comment: Performed at [facility], [address], [place], [place] [address]  Glucose, capillary     Status:  Abnormal   Collection Time: 12/12/18  9:22 AM  Result Value Ref Range   Glucose-Capillary 165 (H) 70 - 99 mg/dL  Glucose, capillary     Status: Abnormal   Collection Time: 12/12/18 11:03 AM  Result Value Ref Range   Glucose-Capillary 179 (H) 70 - 99 mg/dL    Studies/Results:  Clinical Correlation: 05-2018 A normal EEG does not exclude a clinical diagnosis of epilepsy.  If further clinical questions remain, prolonged EEG may be helpful.  Clinical correlation is advised.    BRAIN MRI/brain MRA FINDINGS: MRI HEAD FINDINGS  Brain: Diffusion imaging does not show any acute or subacute infarction. Brainstem and cerebellum are normal. There are old small vessel infarctions in the left thalamus. Cerebral hemispheres appear normal. No large vessel territory abnormality. No mass lesion, hemorrhage, hydrocephalus or extra-axial collection.  Vascular: Major vessels at the base of the brain show flow.  Skull and upper cervical spine: Negative  Sinuses/Orbits: Chronic small left maxillary sinus with mucosal thickening. Orbits negative  Other: None  MRA HEAD FINDINGS  Both internal carotid arteries are widely patent into the brain. No siphon stenosis. The anterior and middle cerebral vessels are patent without proximal stenosis, aneurysm or vascular malformation.  Both vertebral arteries are widely patent to the basilar. No basilar stenosis. Atherosclerotic narrowing in both posterior cerebral arteries, worse on the left than the right, where there is a 50-70% stenosis in the P1 segment. This is worsened compared to the examination of last year.  IMPRESSION: No acute brain finding. Brain appears normal except for some old small vessel infarctions in the left thalamus.  Intracranial MR angiography does not show any anterior circulation abnormality. There is worsening atherosclerotic narrowing and irregularity of the posterior cerebral arteries, left worse than right.  There is now a 50-70% stenosis in the left P1 segment.      The brain MRI and MRA are reviewed in person.  MRA shows reduced flow/signal involving the PCAs bilaterally more on the left side suggestive of moderate stenosis.  MRI shows remote infarct involving the lateral aspect of the thalamus on the left side.  This is most likely lacunar infarct associated with encephalomalacia and increased signal on FLAIR imaging.  No acute changes are noted on DWI and no hemorrhages appreciated.  There is minimal frontal white matter periventricular changes.  Radames Mejorado A. Merlene Laughter, M.D.  Diplomate, Tax adviser of Psychiatry and Neurology ( Neurology). 12/12/2018, 2:18 PM

## 2018-12-12 NOTE — Progress Notes (Signed)
*  PRELIMINARY RESULTS* Echocardiogram 2D Echocardiogram has been performed.  Yvonne [name] 12/12/2018, 10:02 AM

## 2018-12-12 NOTE — Discharge Summary (Signed)
Physician Discharge Summary  Yvonne [name] ZDG:[id number] DOB: [birth year] DOA: 12/11/2018  PCP: [name], MD  Admit date: 12/11/2018 Discharge date: 12/12/2018  Admitted From: Home Disposition: Home  Recommendations for Outpatient Follow-up:  1. Follow up with PCP in 1-2 weeks 2. Please obtain BMP/CBC in one week 3. Follow-up with primary neurology in the next 1 to 2 weeks   Discharge Condition: Stable CODE STATUS: Full code Diet recommendation: Heart healthy, carb modified  Brief/Interim Summary: 68 year old female with a history of prior CVA, was admitted to the hospital with right-sided numbness.  She underwent further work-up for CVA.  MRI brain did not show any acute infarct.  Carotid Dopplers and echocardiogram also unrevealing.  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  She was started on Topamax.  It was also recommended that she increase her aspirin from 81 mg to 325 mg daily.  Patient's symptoms are now resolved and she is requesting discharge home.  The remainder of her chronic medical issues remained stable.  She is recommended to follow-up with her primary neurologist in the next 1 to 2 weeks.  Discharge Diagnoses:  Principal Problem:   Right sided numbness Active Problems:   Hypertension   Acid reflux disease   Type 2 diabetes mellitus (HCC)   Hyperlipidemia with target LDL less than 70   Chronic diastolic CHF (congestive heart failure) (HCC)   Hypokalemia    Discharge Instructions  Discharge Instructions    Diet - low sodium heart healthy   Complete by:  As directed    Increase activity slowly   Complete by:  As directed      Allergies as of 12/12/2018      Reactions   Penicillins Hives   Has patient had a PCN reaction causing immediate rash, facial/tongue/throat swelling, SOB or lightheadedness with hypotension: Yes Has patient had a PCN reaction causing severe rash involving mucus membranes or  skin necrosis: Unk Has patient had a PCN reaction that required hospitalization: No Has patient had a PCN reaction occurring within the last 10 years: No If all of the above answers are "NO", then may proceed with Cephalosporin use.   Gabapentin Other (See Comments)   Causes a lot of lethargy at a higher dose   Lisinopril Cough   Soap Rash   DIAL soap      Medication List    STOP taking these medications   metoprolol succinate 50 MG 24 hr tablet Commonly known as:  TOPROL-XL   REMIFEMIN 20 MG Tabs Generic drug:  [place] Cohosh     TAKE these medications   albuterol 108 (90 Base) MCG/ACT inhaler Commonly known as:  VENTOLIN HFA Inhale 2 puffs into the lungs every 6 (six) hours as needed for wheezing or shortness of breath.   ALPRAZolam 1 MG tablet Commonly known as:  XANAX TAKE 1 TABLET (1MG  TOTAL) 3 (THREE)TIMES DAILY BY MOUTH What changed:    how much to take  how to take this  when to take this  additional instructions   amLODipine 10 MG tablet Commonly known as:  NORVASC TAKE 1 TABLET (5 MG TOTAL) BY MOUTH DAILY. What changed:    how much to take  how to take this  when to take this  additional instructions   aspirin EC 325 MG tablet Take 1 tablet (325 mg total) by mouth daily. What changed:    medication strength  how much to take  when  to take this   atorvastatin 40 MG tablet Commonly known as:  LIPITOR TAKE 1 TABLET EVERY DAY AT 6PM What changed:    how much to take  how to take this  when to take this  additional instructions   BELBUCA 150 MCG Film Generic drug:  Buprenorphine HCl Place 150 mcg under the tongue 2 (two) times daily.   ezetimibe 10 MG tablet Commonly known as:  ZETIA Take 1 tablet (10 mg total) by mouth daily. For cholesterol What changed:  when to take this   fluticasone furoate-vilanterol 100-25 MCG/INH Aepb Commonly known as:  BREO ELLIPTA Inhale 1 puff into the lungs daily.   furosemide 20 MG  tablet Commonly known as:  LASIX Take 1 tablet (20 mg total) by mouth daily.   loratadine 10 MG tablet Commonly known as:  CLARITIN Take 10 mg by mouth daily.   metFORMIN 750 MG 24 hr tablet Commonly known as:  GLUCOPHAGE-XR Take 1 tablet (750 mg total) by mouth 2 (two) times daily.   pantoprazole 40 MG tablet Commonly known as:  PROTONIX Take 1 tablet (40 mg total) by mouth 2 (two) times daily. For stomach   topiramate 25 MG tablet Commonly known as:  TOPAMAX Take 25mg  po daily for 7 days then 25mg  po bid      Follow-up Information    Metta Clines R, DO. Schedule an appointment as soon as possible for a visit in 2 week(s).   Specialty:  Neurology Contact information: [address] [place] [address] [phone]          Allergies  Allergen Reactions  . Penicillins Hives    Has patient had a PCN reaction causing immediate rash, facial/tongue/throat swelling, SOB or lightheadedness with hypotension: Yes Has patient had a PCN reaction causing severe rash involving mucus membranes or skin necrosis: Unk Has patient had a PCN reaction that required hospitalization: No Has patient had a PCN reaction occurring within the last 10 years: No If all of the above answers are "NO", then may proceed with Cephalosporin use.   . Gabapentin Other (See Comments)    Causes a lot of lethargy at a higher dose  . Lisinopril Cough  . Soap Rash    DIAL soap    Consultations:  Neurology   Procedures/Studies: Dg Chest 2 View  Result Date: 12/11/2018 CLINICAL DATA:  Chest pain for 3 days EXAM: CHEST - 2 VIEW COMPARISON:  09/09/2017 FINDINGS: The heart size and mediastinal contours are within normal limits. Both lungs are clear. The visualized skeletal structures are unremarkable. IMPRESSION: No active cardiopulmonary disease. Electronically Signed   By: Ulyses Jarred M.D.   On: 12/11/2018 23:53   Ct Head Wo Contrast  Result Date: 12/11/2018 CLINICAL DATA:   Altered level of consciousness, right-sided weakness, right foot tingling EXAM: CT HEAD WITHOUT CONTRAST TECHNIQUE: Contiguous axial images were obtained from the base of the skull through the vertex without intravenous contrast. COMPARISON:  MRI brain dated 06/06/2018 FINDINGS: Brain: No evidence of acute infarction, hemorrhage, hydrocephalus, extra-axial collection or mass lesion/mass effect. Mild subcortical white matter and periventricular small vessel ischemic changes. Vascular: Intracranial atherosclerosis. Skull: Normal. Negative for fracture or focal lesion. Sinuses/Orbits: The visualized paranasal sinuses are essentially clear. The mastoid air cells are unopacified. Other: None. IMPRESSION: No evidence of acute intracranial abnormality. Mild small vessel ischemic changes. Electronically Signed   By: Julian Hy M.D.   On: 12/11/2018 22:14   Mr Brain Wo Contrast  Result Date: 12/12/2018 CLINICAL DATA:  Worsening weakness in the right arm and leg. EXAM: MRI HEAD WITHOUT CONTRAST MRA HEAD WITHOUT CONTRAST TECHNIQUE: Multiplanar, multiecho pulse sequences of the brain and surrounding structures were obtained without intravenous contrast. Angiographic images of the head were obtained using MRA technique without contrast. COMPARISON:  Head CT 12/11/2018 MRI 06/06/2018. FINDINGS: MRI HEAD FINDINGS Brain: Diffusion imaging does not show any acute or subacute infarction. Brainstem and cerebellum are normal. There are old small vessel infarctions in the left thalamus. Cerebral hemispheres appear normal. No large vessel territory abnormality. No mass lesion, hemorrhage, hydrocephalus or extra-axial collection. Vascular: Major vessels at the base of the brain show flow. Skull and upper cervical spine: Negative Sinuses/Orbits: Chronic small left maxillary sinus with mucosal thickening. Orbits negative Other: None MRA HEAD FINDINGS Both internal carotid arteries are widely patent into the brain. No siphon  stenosis. The anterior and middle cerebral vessels are patent without proximal stenosis, aneurysm or vascular malformation. Both vertebral arteries are widely patent to the basilar. No basilar stenosis. Atherosclerotic narrowing in both posterior cerebral arteries, worse on the left than the right, where there is a 50-70% stenosis in the P1 segment. This is worsened compared to the examination of last year. IMPRESSION: No acute brain finding. Brain appears normal except for some old small vessel infarctions in the left thalamus. Intracranial MR angiography does not show any anterior circulation abnormality. There is worsening atherosclerotic narrowing and irregularity of the posterior cerebral arteries, left worse than right. There is now a 50-70% stenosis in the left P1 segment. Electronically Signed   By: Nelson Chimes M.D.   On: 12/12/2018 08:36   US Carotid Bilateral (at [facility] And [facility] Only)  Result Date: 12/12/2018 CLINICAL DATA:  68 year old with right-sided numbness. EXAM: BILATERAL CAROTID DUPLEX ULTRASOUND TECHNIQUE: Pearline Cables scale imaging, color Doppler and duplex ultrasound were performed of bilateral carotid and vertebral arteries in the neck. COMPARISON:  Neck CTA 03/21/2017 FINDINGS: Criteria: Quantification of carotid stenosis is based on velocity parameters that correlate the residual internal carotid diameter with NASCET-based stenosis levels, using the diameter of the distal internal carotid lumen as the denominator for stenosis measurement. The following velocity measurements were obtained: RIGHT ICA: 100 cm/sec CCA: 91 cm/sec SYSTOLIC ICA/CCA RATIO:  1.1 ECA: 67 cm/sec LEFT ICA: 94 cm/sec CCA: 80 cm/sec SYSTOLIC ICA/CCA RATIO:  1.2 ECA: 74 cm/sec RIGHT CAROTID ARTERY: Intimal thickening in the distal common carotid artery. External carotid artery is patent with normal waveform. Small amount of echogenic calcified plaque in the proximal internal carotid artery. Normal waveforms and velocities in the  internal carotid artery. RIGHT VERTEBRAL ARTERY: Antegrade flow and normal waveform in the right vertebral artery. LEFT CAROTID ARTERY: Minimal plaque at the left carotid bulb. External carotid artery is patent with normal waveform. Minimal plaque in the proximal internal carotid artery. Normal waveforms and velocities in the internal carotid artery. LEFT VERTEBRAL ARTERY: Antegrade flow and normal waveform in the left vertebral artery. IMPRESSION: Minimal plaque in bilateral carotid arteries. No significant carotid artery stenosis. Estimated degree of stenosis in the internal carotid arteries is less than 50% bilaterally. Patent vertebral arteries with antegrade flow. Electronically Signed   By: Markus Daft M.D.   On: 12/12/2018 09:08   Mr Jodene Nam Head/brain SW Cm  Result Date: 12/12/2018 CLINICAL DATA:  Worsening weakness in the right arm and leg. EXAM: MRI HEAD WITHOUT CONTRAST MRA HEAD WITHOUT CONTRAST TECHNIQUE: Multiplanar, multiecho pulse sequences of the brain and surrounding structures were obtained without  intravenous contrast. Angiographic images of the head were obtained using MRA technique without contrast. COMPARISON:  Head CT 12/11/2018 MRI 06/06/2018. FINDINGS: MRI HEAD FINDINGS Brain: Diffusion imaging does not show any acute or subacute infarction. Brainstem and cerebellum are normal. There are old small vessel infarctions in the left thalamus. Cerebral hemispheres appear normal. No large vessel territory abnormality. No mass lesion, hemorrhage, hydrocephalus or extra-axial collection. Vascular: Major vessels at the base of the brain show flow. Skull and upper cervical spine: Negative Sinuses/Orbits: Chronic small left maxillary sinus with mucosal thickening. Orbits negative Other: None MRA HEAD FINDINGS Both internal carotid arteries are widely patent into the brain. No siphon stenosis. The anterior and middle cerebral vessels are patent without proximal stenosis, aneurysm or vascular malformation.  Both vertebral arteries are widely patent to the basilar. No basilar stenosis. Atherosclerotic narrowing in both posterior cerebral arteries, worse on the left than the right, where there is a 50-70% stenosis in the P1 segment. This is worsened compared to the examination of last year. IMPRESSION: No acute brain finding. Brain appears normal except for some old small vessel infarctions in the left thalamus. Intracranial MR angiography does not show any anterior circulation abnormality. There is worsening atherosclerotic narrowing and irregularity of the posterior cerebral arteries, left worse than right. There is now a 50-70% stenosis in the left P1 segment. Electronically Signed   By: Nelson Chimes M.D.   On: 12/12/2018 08:36   Dg Wrfm Dexa  Result Date: 12/05/2018 EXAM: DUAL X-RAY ABSORPTIOMETRY (DXA) FOR BONE MINERAL DENSITY IMPRESSION: Your patient LITICIA GASIOR completed a FRAX assessment on 12/05/2018 using the [place] (analysis version: 17 SP 1) manufactured by [employer]. The following summarizes the results of our evaluation. PATIENT BIOGRAPHICAL: Name: Charlean, Carneal Patient ID: [id number] Birth Date: 02/22/1951 Height:    62.0 in. Gender:     [age]    [age]       Weight:    178.4 lbs. Ethnicity:  White                            Exam Date: 12/05/2018 FRAX* RESULTS:  (version: 3.8) 10-year Probability of Fracture1 Major Osteoporotic Fracture2 Hip Fracture 10.3%                        1.6% Population:                  [place] (Caucasian) Risk Factors:                None Based on DualFemur (Left) Neck BMD 1 -The 10-year probability of fracture may be lower than reported if the patient has received treatment. 2 -Major Osteoporotic Fracture: Clinical Spine, Forearm, Hip or Shoulder *FRAX is a Materials engineer of the [employer] for Metabolic Bone Disease, a [place] (WHO) [facility]. ASSESSMENT: The probability of a  major osteoporotic fracture is 10.3% within the next ten years The probability of a hip fracture is 1.6% within the next ten years [facility]  Radiology Your patient Jocelynn Gioffre completed a BMD test on 12/05/2018 using the [place] (software version: 17 SP 1) manufactured by [employer]. The following summarizes the results of our evaluation.Tech:JSP PATIENT BIOGRAPHICAL: Name: Amberley, Hamler Patient ID: [id number] Birth Date: March 06, 1951 Height: 62.0 in. Gender: Female Exam  Date: 12/05/2018 Weight: 178.4 lbs. Indications: Bilateral Ovariectomy, Caucasian Fractures: CLAVICLE Treatments: Thiazide DENSITOMETRY RESULTS: Site Region Meas'd Date Meas'd Age WHO Class. YA T-score BMD %Chg Prev. Sig. Chg (*) AP Spine L1-L4 12/05/2018 [age] years Osteopenia -2.2 0.928 g/cm2 -0.5% AP Spine L1-L4 07/20/2015 [age] years Osteopenia -2.1 0.933 g/cm2 - DualFemur Neck Left 12/05/2018 68.8 years Osteopenia -1.8 0.786 g/cm2 4.8% DualFemur Neck Left 07/20/2015 65.4 years Osteopenia -2.1 0.750 g/cm2 - DualFemur Total Mean 12/05/2018 68.8 years Osteopenia -1.3 0.847 g/cm2 -2.6% DualFemur Total Mean 07/20/2015 65.4 years Osteopenia -1.1 0.870 g/cm2 - - ASSESSMENT: The BMD measured at AP Spine L1-L4 is 0.928 g/cm2 with a T-score of -2.2. This patient is considered osteopenic according to [facility]) criteria. Compared with the prior study on 07/20/2015,the BMD of the (lumbar spine/total hip/femoral neck shows no statistically significant change .The scan quality is good. - World Health Organization [facility]) criteria for post-menopausal, Caucasian Women: Normal:       T-score at or above -1 SD Osteopenia:   T-score between -1 and -2.5 SD Osteoporosis: T-score at or below -2.5 SD - RECOMMENDATIONS: 1. All patients should optimize calcium and vitamin D intake. 2. Consider FDA-approved medical therapies in postmenopausal women and men aged 68 years and older, based on the following: a. Hip or  vertebral (clinical or morphometric ) fracture. b. T-score < -2.5 at the femoral neck or spine after appropriate evaluation to exclude secondary causes c.Low bone mass (T-score between -1.0 and -2.5 at the femoral neck or spine) and a 10 -year probability of a hip fracture > 3% or a 10- year probability of a major osteoporosis- related fracture > 20% based on the [place]-adapted WHO algorithm d. Clinician judgment and/or patient preferences may inidcate treatment for people with 10 -year fracture probabilities above or below these levels. - FOLLOW-UP: People with diagnosed cases of osteoporosis or at high risk for fracture should have regular bone mineral density tests. For patients eligible for Medicare, routine testing is allowed once every 2 years. The testing frequency can be increased to one year for patients who have rapidly progressing disease, those who are receiving or discontinuing medical therapy to restore bone mass, or have additional risk factors. I have reviewed this report, and agree with the above findings. [facility] Radiology - FRAX* RESULTS:  (version: 3.8) 10-year Probability of Fracture1 Major Osteoporotic Fracture2 Hip Fracture 10.3%                        1.6% Population:                  [place] (Caucasian) Risk Factors:                None Based on DualFemur (Left) Neck BMD 1 -The 10-year probability of fracture may be lower than reported if the patient has received treatment. 2 -Major Osteoporotic Fracture: Clinical Spine, Forearm, Hip or Shoulder *FRAX is a Materials engineer of the [employer] for Metabolic Bone Disease, a [place] (WHO) [facility]. I have reviewed this report, and agree with the above findings. [facility] Radiology Electronically Signed   By: Lowella Grip III M.D.   On: 12/05/2018 09:32       Subjective: Numbness on the right side has largely resolved.  Discharge Exam: Vitals:   12/12/18 0930 12/12/18 1130  12/12/18 1136 12/12/18 1530  BP: (!) 139/55 (!) 154/62 (!) 154/62 (!) 175/71  Pulse: 65 63 63 67  Resp: 18 19 19 18   Temp: 98 F (36.7 C) 98.6 F (37 C) 98.6 F (37 C) 97.9 F (36.6 C)  TempSrc: Oral Oral  Oral  SpO2: 97% 98% 98% 98%  Weight:      Height:        General: Pt is alert, awake, not in acute distress Cardiovascular: RRR, S1/S2 +, no rubs, no gallops Respiratory: CTA bilaterally, no wheezing, no rhonchi Abdominal: Soft, NT, ND, bowel sounds + Extremities: no edema, no cyanosis    The results of significant diagnostics from this hospitalization (including imaging, microbiology, ancillary and laboratory) are listed below for reference.     Microbiology: Recent Results (from the past 240 hour(s))  Fecal occult blood, imunochemical     Status: None   Collection Time: 12/03/18 11:13 AM  Result Value Ref Range Status   Fecal Occult Bld Negative Negative Final     Labs: BNP (last 3 results) No results for input(s): BNP in the last 8760 hours. Basic Metabolic Panel: Recent Labs  Lab 12/11/18 1955 12/11/18 2007 12/12/18 0449  NA 135 138 139  K 3.0* 3.0* 3.2*  CL 100 100 107  CO2 28  --  26  GLUCOSE 222* 217* 145*  BUN 20 19 15   CREATININE 0.83 0.80 0.69  CALCIUM 8.8*  --  8.6*  MG 2.1  --   --   PHOS 3.5  --   --    Liver Function Tests: Recent Labs  Lab 12/11/18 1955  AST 23  ALT 20  ALKPHOS 87  BILITOT 0.4  PROT 6.5  ALBUMIN 3.5   No results for input(s): LIPASE, AMYLASE in the last 168 hours. No results for input(s): AMMONIA in the last 168 hours. CBC: Recent Labs  Lab 12/11/18 1955 12/11/18 2007  WBC 6.9  --   NEUTROABS 3.7  --   HGB 12.4 12.9  HCT 38.1 38.0  MCV 85.2  --   PLT 231  --    Cardiac Enzymes: No results for input(s): CKTOTAL, CKMB, CKMBINDEX, TROPONINI in the last 168 hours. BNP: Invalid input(s): POCBNP CBG: Recent Labs  Lab 12/11/18 1937 12/12/18 0922 12/12/18 1103  GLUCAP 194* 165* 179*   D-Dimer No  results for input(s): DDIMER in the last 72 hours. Hgb A1c Recent Labs    12/11/18 1955  HGBA1C 7.7*   Lipid Profile Recent Labs    12/12/18 0449  CHOL 210*  HDL 47  LDLCALC 141*  TRIG 111  CHOLHDL 4.5   Thyroid function studies No results for input(s): TSH, T4TOTAL, T3FREE, THYROIDAB in the last 72 hours.  Invalid input(s): FREET3 Anemia work up No results for input(s): VITAMINB12, FOLATE, FERRITIN, TIBC, IRON, RETICCTPCT in the last 72 hours. Urinalysis    Component Value Date/Time   COLORURINE YELLOW 12/11/2018 2105   APPEARANCEUR CLEAR 12/11/2018 2105   APPEARANCEUR Cloudy (A) 10/22/2018 0913   LABSPEC 1.014 12/11/2018 2105   PHURINE 6.0 12/11/2018 2105   GLUCOSEU 50 (A) 12/11/2018 2105   HGBUR SMALL (A) 12/11/2018 2105   BILIRUBINUR NEGATIVE 12/11/2018 2105   BILIRUBINUR Negative 10/22/2018 0913   KETONESUR NEGATIVE 12/11/2018 2105   PROTEINUR NEGATIVE 12/11/2018 2105   NITRITE NEGATIVE 12/11/2018 2105   LEUKOCYTESUR TRACE (A) 12/11/2018 2105   LEUKOCYTESUR 1+ (A) 10/22/2018 0913   Sepsis Labs Invalid input(s): PROCALCITONIN,  WBC,  LACTICIDVEN Microbiology Recent Results (from the past 240 hour(s))  Fecal occult blood, imunochemical     Status: None   Collection Time: 12/03/18 11:13  AM  Result Value Ref Range Status   Fecal Occult Bld Negative Negative Final     Time coordinating discharge: 91mins  SIGNED:   Kathie Dike, MD  [facility] 12/12/2018, 7:49 PM   If 7PM-7AM, please contact night-coverage [email or web address]

## 2018-12-12 NOTE — Progress Notes (Signed)
SLP Cancellation Note  Patient Details Name: Yvonne [name] MRN: [id number] DOB: [birth year]   Cancelled treatment:       Reason Eval/Treat Not Completed: SLP screened Pt in room. Pt denies any changes in swallowing, speech, language, or cognition. MRI negative for acute changes. SLE will be deferred at this time. Reconsult if indicated. SLP will sign off.  Yvonne [name], CCC-SLP Speech Language Pathologist    Yvonne [name] 12/12/2018, 2:03 PM

## 2018-12-12 NOTE — Progress Notes (Signed)
IV removed, 2x2 gauze and paper tape applied to site, patient tolerated well. Reviewed AVS with patient, who verbalized understanding.  Patient transported home by her husband.

## 2018-12-12 NOTE — Care Management Obs Status (Signed)
[place] NOTIFICATION   Patient Details  Name: Yvonne [name] MRN: [id number] Date of Birth: June 28, 1951   Medicare Observation Status Notification Given:  Yes    Shelda Altes 12/12/2018, 12:30 PM

## 2018-12-12 NOTE — Progress Notes (Signed)
OT Cancellation Note  Patient Details Name: Yvonne [name] MRN: [id number] DOB: 02-Apr-1951   Cancelled Treatment:    Reason Eval/Treat Not Completed: OT screened, no needs identified, will sign off. OT arrived as pt returned from MRI. Pt reports symptoms have resolved with exception of baseline tinging in hand and face from prior CVA/TIA. Pt demonstrating BUE strength equivalent at 4/5, even grip strength, coordination and sensation are intact. Pt performing bed mobility and transfers at supervision level. No further acute OT services required at this time as pt is at her baseline with ADL completion.   Guadelupe Sabin, OTR/L  [phone] 12/12/2018, 9:04 AM

## 2018-12-13 LAB — HIV ANTIBODY (ROUTINE TESTING W REFLEX): HIV Screen 4th Generation wRfx: NONREACTIVE

## 2018-12-14 LAB — URINE CULTURE: Culture: 40000 — AB

## 2018-12-15 ENCOUNTER — Ambulatory Visit: Payer: Medicare HMO | Admitting: Family Medicine

## 2018-12-21 DIAGNOSIS — G4733 Obstructive sleep apnea (adult) (pediatric): Secondary | ICD-10-CM | POA: Diagnosis not present

## 2018-12-22 ENCOUNTER — Ambulatory Visit (INDEPENDENT_AMBULATORY_CARE_PROVIDER_SITE_OTHER): Payer: Medicare HMO | Admitting: Licensed Clinical Social Worker

## 2018-12-22 DIAGNOSIS — E785 Hyperlipidemia, unspecified: Secondary | ICD-10-CM | POA: Diagnosis not present

## 2018-12-22 DIAGNOSIS — F411 Generalized anxiety disorder: Secondary | ICD-10-CM

## 2018-12-22 DIAGNOSIS — E119 Type 2 diabetes mellitus without complications: Secondary | ICD-10-CM | POA: Diagnosis not present

## 2018-12-22 NOTE — Patient Instructions (Signed)
Licensed Clinical Social Worker Visit Information  Ms. Belli was given information about Chronic Care Management services today including:  1. CCM service includes personalized support from designated clinical staff supervised by her physician, including individualized plan of care and coordination with other care providers 2. 24/7 contact phone numbers for assistance for urgent and routine care needs. 3. Service will only be billed when office clinical staff spend 20 minutes or more in a month to coordinate care. 4. Only one practitioner may furnish and bill the service in a calendar month. 5. The patient may stop CCM services at any time (effective at the end of the month) by phone call to the office staff. 6. The patient will be responsible for cost sharing (co-pay) of up to 20% of the service fee (after annual deductible is met).  Patient agreed to services and verbal consent obtained.    Current Barriers:   Limited social support   Spouse of client is ill  Financial issues  Goal:   Patient Stated :  "I want to get help in managing stress and anxiety"  Clinical Social Work Clinical Goal(s): Over the next 30 days, Sindee will talk with [name] regarding management of anxiety and stress symptoms of client.   Interventions:  [name] interviewed client related to client psychosocial needs.  [name] encouraged client to use relaxation techniques of choice to manage anxiety and stress symptoms.  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  [name] encouraged client to talk with [name] to discuss stress/anxiety symptoms experienced regularly by client.  Patient Self Care Activities:   Client attends medical appointments   Client speaks with sister daily for emotional support    Client enjoys reading as a way to manage stress.  Plan:   Client to call [name] as needed to discuss anxiety and stress symptoms of client.  [name] to  call client in two weeks to discuss relaxation techniques usage by client  [name] to call client in two weeks to further discuss CCM program services provision   Client to read her favorite books as ways to help her relax,manage stress  *initial goal documentation  Follow Up Plan: [name] to call client in two weeks to discuss relaxation techniques of use to client to help her manage anxiety and stress symptoms.   The patient verbalized understanding of instructions provided today and declined a print copy of patient instruction materials.   Yvonne [name].Isel Skufca MSW, [name] Licensed Clinical Social Worker [facility] [phone]

## 2018-12-22 NOTE — Chronic Care Management (AMB) (Signed)
  Chronic Care Management   Social Work Note  12/22/2018 Name: Yvonne [name] MRN: [id number] DOB: 06-Feb-1951  Referred by: Dr. [name] for psychosocial assessment.  [name] called client to discuss CCM services (second call to discuss CCM services). [name] discussed CCM services available to client through CCM program.    Yvonne [name] was given information about Chronic Care Management services today including:  1. CCM service includes personalized support from designated clinical staff supervised by her physician, including individualized plan of care and coordination with other care providers 2. 24/7 contact phone numbers for assistance for urgent and routine care needs. 3. Service will only be billed when office clinical staff spend 20 minutes or more in a month to coordinate care. 4. Only one practitioner may furnish and bill the service in a calendar month. 5. The patient may stop CCM services at any time (effective at the end of the month) by phone call to the office staff. 6. The patient will be responsible for cost sharing (co-pay) of up to 20% of the service fee (after annual deductible is met).  Patient agreed to services and verbal consent obtained.    Current Barriers:  . Limited social support  . Spouse of client is ill . Financial issues  Goal:   Patient Stated :  "I want to get help in managing stress and anxiety"  Clinical Social Work Clinical Goal(s): Over the next 30 days, Yvonne [name] will talk with [name] regarding management of anxiety and stress symptoms of client.   Interventions: . [name] interviewed client related to client psychosocial needs. [name] Kitchen [name] encouraged client to use relaxation techniques of choice to manage anxiety and stress symptoms. 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 [name] Kitchen [name] encouraged client to talk with [name] to discuss stress/anxiety symptoms experienced regularly by client.  Patient Self  Care Activities:  . Client attends medical appointments .  Client speaks with sister daily for emotional support  .  Client enjoys reading as a way to manage stress.  Plan:   Client to call [name] as needed to discuss anxiety and stress symptoms of client.  [name] to call client in two weeks to discuss relaxation techniques usage by client . [name] to call client in two weeks to further discuss CCM program services provision .  Client to read her favorite books as ways to help her relax,manage stress  *initial goal documentation  Follow Up Plan: [name] to call client in two weeks to discuss relaxation techniques of use to client to help her manage anxiety and stress symptoms.   [name].Fahmida Jurich MSW, [name] Licensed Clinical Social Worker [facility] [phone]

## 2019-01-02 ENCOUNTER — Ambulatory Visit: Payer: Self-pay | Admitting: Licensed Clinical Social Worker

## 2019-01-02 DIAGNOSIS — I10 Essential (primary) hypertension: Secondary | ICD-10-CM

## 2019-01-02 DIAGNOSIS — F411 Generalized anxiety disorder: Secondary | ICD-10-CM

## 2019-01-02 DIAGNOSIS — E785 Hyperlipidemia, unspecified: Secondary | ICD-10-CM

## 2019-01-02 NOTE — Patient Instructions (Addendum)
Licensed Clinical Social Worker Visit Information  Goals we discussed today:    Current Barriers:  Limited social support  Spouse of client is ill  Financial issues  Goal:  Patient Stated : "I want to get help in managing stress and anxiety"  Clinical Social Work Clinical Goal(s):Over the next 30 days, Tijuana will talk with [name] regarding management of anxiety and stress symptoms of client.  Interventions:  [name] interviewed client related to client psychosocial needs.  [name] encouraged client to use relaxation techniques of choice to manage anxiety and stress symptoms.  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  [name] encouraged client to talk with [name] to discuss stress/anxiety symptoms experienced regularly by client.  Patient Self Care Activities:  Client attends medical appointments  Client speaks with sister daily for emotional support   Client enjoys reading as a way to manage stress.  Plan:  Client to call [name] as needed to discuss anxiety and stress symptoms of client.  [name] to call client in two weeks to discuss relaxation techniques usage by client  Client to read her favorite books as ways to help her relax,manage stress  Client attend medical appointments as scheduled  Client to communicate as needed with [name] Kirby Funk related to nursing needs of client  Client to engage in self care activities of choice  *initial goal documentation  Follow Up Plan:[name] to call client in two weeks to discuss relaxation techniques of use to client to help her manage anxiety and stress symptoms.  The patient verbalized understanding of instructions provided today and declined a print copy of patient instruction materials.   [name].Phuong Hillary MSW, [name] Licensed Clinical Social Worker [facility] [phone]

## 2019-01-02 NOTE — Chronic Care Management (AMB) (Signed)
  Chronic Care Management    Clinical Social Work General Follow Up Note  01/02/2019 Name: Yvonne [name] MRN: [id number] DOB: 05-09-1951   Referred by: PCP, Dr.Warren [name] for psychosocial assessment and counseling support  Review of patient status, including review of consultants reports, relevant laboratory and other test results, and collaboration with appropriate care team members and the patient's provider was performed as part of comprehensive patient evaluation and provision of chronic care management services.    Last CCM Appointment: 01/02/2019  Depression screen PHQ 2/9 12/05/2018  Decreased Interest 2  Down, Depressed, Hopeless 2  PHQ - 2 Score 4  Altered sleeping 2  Tired, decreased energy 2  Change in appetite 3  Feeling bad or failure about yourself  2  Trouble concentrating 2  Moving slowly or fidgety/restless 1  Suicidal thoughts 0  PHQ-9 Score 16  Some recent data might be hidden     GAD 7 : Generalized Anxiety Score 12/05/2018 01/22/2018 04/30/2016 11/04/2015  Nervous, Anxious, on Edge 3 2 1  0  Control/stop worrying 3 3 3 1   Worry too much - different things 3 1 3 3   Trouble relaxing 3 1 3 3   Restless 2 1 2 2   Easily annoyed or irritable 2 1 2 2   Afraid - awful might happen 3 1 0 1  Total GAD 7 Score 19 10 14 12   Anxiety Difficulty Extremely difficult - Somewhat difficult -     Current Barriers:   Limited social support   Spouse of client is ill  Financial issues  Goal:   Patient Stated :  "I want to get help in managing stress and anxiety"  Clinical Social Work Clinical Goal(s): Over the next 30 days, Yvonne [name] will talk with [name] regarding management of anxiety and stress symptoms of client.   Interventions:  [name] interviewed client related to client psychosocial needs.  [name] encouraged client to use relaxation techniques of choice to manage anxiety and stress symptoms.  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  [name] encouraged client to talk with [name] to discuss stress/anxiety symptoms experienced regularly by client.  Patient Self Care Activities:   Client attends medical appointments   Client speaks with sister daily for emotional support    Client enjoys reading as a way to manage stress.  Plan:   Client to call [name] as needed to discuss anxiety and stress symptoms of client.  [name] to call client in two weeks to discuss relaxation techniques usage by client   Client to read her favorite books as ways to help her relax,manage stress  Client attend medical appointments as scheduled  Client to communicate as needed with [name] Yvonne [name] related to nursing needs of client  Client to engage in self care activities of choice  *initial goal documentation  Follow Up Plan: [name] to call client in two weeks to discuss relaxation techniques of use to client to help her manage anxiety and stress symptoms.   Yvonne [name] MSW, [name] Licensed Clinical Social Worker [facility] [phone]

## 2019-01-07 ENCOUNTER — Ambulatory Visit (INDEPENDENT_AMBULATORY_CARE_PROVIDER_SITE_OTHER): Payer: Medicare HMO | Admitting: Licensed Clinical Social Worker

## 2019-01-07 DIAGNOSIS — F411 Generalized anxiety disorder: Secondary | ICD-10-CM

## 2019-01-07 DIAGNOSIS — E119 Type 2 diabetes mellitus without complications: Secondary | ICD-10-CM

## 2019-01-07 DIAGNOSIS — I10 Essential (primary) hypertension: Secondary | ICD-10-CM | POA: Diagnosis not present

## 2019-01-07 DIAGNOSIS — E785 Hyperlipidemia, unspecified: Secondary | ICD-10-CM

## 2019-01-07 NOTE — Patient Instructions (Signed)
Licensed Clinical Water engineer Provided:  No  Goals we discussed today:   Current Barriers:  Limited social support  Spouse of client is ill  Financial issues  Goal:  Patient Stated : "I want to get help in managing stress and anxiety"  Clinical Social Work Clinical Goal(s):Over the next 30 days, Mane will talk with [name] regarding management of anxiety and stress symptoms of client.  Interventions:  [name] interviewed client related to client psychosocial needs.  [name] encouraged client to use relaxation techniques of choice to manage anxiety and stress symptoms.  [name] encouraged client to talk with [name] to discuss stress/anxiety symptoms experienced regularly by client.  [name] provided counseling support for client  Patient Self Care Activities:  Client attends medical appointments  Client speaks with sister daily for emotional support   Client enjoys reading as a way to manage stress.  Plan:  Client to call [name] as needed to discuss anxiety and stress symptoms of client.  [name] to call client in three weeks to discuss relaxation techniques usage by client  Client to read her favorite books as ways to help her relax,manage stress  Client attend medical appointments as scheduled  Client to communicate as needed with [name] Kirby Funk related to nursing needs of client  Client to engage in self care activities of choice  Client to talk regularly with her sister as a means of emotional support  *initial goal documentation  Follow Up Plan:[name] to call client in three weeks to discuss relaxation techniques of use to client to help her manage anxiety and stress symptoms.  The patient verbalized understanding of instructions provided today and declined a print copy of patient instruction materials.    [name].Disha Cottam MSW, [name] Licensed Clinical Social Worker [facility]  [facility] [phone]

## 2019-01-07 NOTE — Chronic Care Management (AMB) (Signed)
  Chronic Care Management    Clinical Social Work General Follow Up Note  01/07/2019 Name: Yvonne [name] MRN: [id number] DOB: 1950/12/28   Referred by: PCP, Dr.Warren [name] for psychosocial assessment and counseling support  Review of patient status, including review of consultants reports, relevant laboratory and other test results, and collaboration with appropriate care team members and the patient's provider was performed as part of comprehensive patient evaluation and provision of chronic care management services.    Last CCM Appointment: 01/07/2019  Depression screen PHQ 2/9 12/05/2018  Decreased Interest 2  Down, Depressed, Hopeless 2  PHQ - 2 Score 4  Altered sleeping 2  Tired, decreased energy 2  Change in appetite 3  Feeling bad or failure about yourself  2  Trouble concentrating 2  Moving slowly or fidgety/restless 1  Suicidal thoughts 0  PHQ-9 Score 16  Some recent data might be hidden     GAD 7 : Generalized Anxiety Score 12/05/2018 01/22/2018 04/30/2016 11/04/2015  Nervous, Anxious, on Edge 3 2 1  0  Control/stop worrying 3 3 3 1   Worry too much - different things 3 1 3 3   Trouble relaxing 3 1 3 3   Restless 2 1 2 2   Easily annoyed or irritable 2 1 2 2   Afraid - awful might happen 3 1 0 1  Total GAD 7 Score 19 10 14 12   Anxiety Difficulty Extremely difficult - Somewhat difficult -       Current Barriers:  Limited social support  Spouse of client is ill  Financial issues  Goal:  Patient Stated : "I want to get help in managing stress and anxiety"  Clinical Social Work Clinical Goal(s):Over the next 30 days, Yvonne [name] will talk with [name] regarding management of anxiety and stress symptoms of client.  Interventions:  [name] interviewed client related to client psychosocial needs.  [name] encouraged client to use relaxation techniques of choice to manage anxiety and stress symptoms.  [name] encouraged client to talk with [name] to discuss stress/anxiety  symptoms experienced regularly by client.  [name] provided counseling support for client  Patient Self Care Activities:  Client attends medical appointments  Client speaks with sister daily for emotional support   Client enjoys reading as a way to manage stress.  Plan:  Client to call [name] as needed to discuss anxiety and stress symptoms of client.  [name] to call client in three weeks to discuss relaxation techniques usage by client  Client to read her favorite books as ways to help her relax,manage stress  Client attend medical appointments as scheduled  Client to communicate as needed with [name] Yvonne [name] related to nursing needs of client  Client to engage in self care activities of choice  Client to talk regularly with her sister as a means of emotional support  *initial goal documentation  Follow Up Plan:[name] to call client in three weeks to discuss relaxation techniques of use to client to help her manage anxiety and stress symptoms.  Yvonne [name].Yvonne [name] MSW, [name] Licensed Clinical Social Worker [facility] [phone]

## 2019-01-20 ENCOUNTER — Ambulatory Visit (INDEPENDENT_AMBULATORY_CARE_PROVIDER_SITE_OTHER): Payer: Medicare HMO | Admitting: Family Medicine

## 2019-01-20 ENCOUNTER — Encounter: Payer: Self-pay | Admitting: Family Medicine

## 2019-01-20 VITALS — BP 162/85 | HR 67 | Temp 97.3°F | Ht 62.0 in | Wt 179.0 lb

## 2019-01-20 DIAGNOSIS — J209 Acute bronchitis, unspecified: Secondary | ICD-10-CM

## 2019-01-20 DIAGNOSIS — R062 Wheezing: Secondary | ICD-10-CM | POA: Diagnosis not present

## 2019-01-20 MED ORDER — PREDNISONE 20 MG PO TABS: ORAL_TABLET | ORAL | 0 refills | Status: DC

## 2019-01-20 MED ORDER — DOXYCYCLINE MONOHYDRATE 100 MG PO CAPS: 100.0000 mg | ORAL_CAPSULE | Freq: Two times a day (BID) | ORAL | 0 refills | Status: AC

## 2019-01-20 MED ORDER — HYDROCOD POLST-CPM POLST ER 10-8 MG/5ML PO SUER: 5.0000 mL | Freq: Two times a day (BID) | ORAL | 0 refills | Status: DC | PRN

## 2019-01-20 NOTE — Patient Instructions (Signed)

## 2019-01-20 NOTE — Progress Notes (Signed)
    Subjective:     Yvonne [name] is a 68 y.o. female who presents for evaluation of symptoms of a URI, productive cough. Symptoms include congestion, nasal congestion, no  fever, productive cough with  yellow colored sputum and wheezing. Onset of symptoms was 5 days ago, and has been gradually worsening since that time. Treatment to date: none.  The following portions of the patient's history were reviewed and updated as appropriate: allergies, current medications, past family history, past medical history, past social history, past surgical history and problem list.  Review of Systems Constitutional: positive for chills and fatigue Eyes: negative Ears, nose, mouth, throat, and face: positive for hoarseness and sore throat Respiratory: positive for cough, sputum and wheezing Cardiovascular: negative Musculoskeletal:negative Neurological: negative   Objective:    BP (!) 162/85   Pulse 67   Temp (!) 97.3 F (36.3 C) (Oral)   Ht 5\' 2"  (1.575 m)   Wt 179 lb (81.2 kg)   SpO2 97%   BMI 32.74 kg/m  General appearance: alert, cooperative and mild distress Head: Normocephalic, without obvious abnormality, atraumatic Eyes: negative Ears: bliateral TM tubes, no erythema or drainage Nose: mild congestion, no sinus tenderness Throat: abnormal findings: mild oropharyngeal erythema Lungs: wheezes posterior - bilateral Heart: regular rate and rhythm, S1, S2 normal, no murmur, click, rub or gallop Skin: Skin color, texture, turgor normal. No rashes or lesions Neurologic: Grossly normal   Assessment:     Yvonne [name] was seen today for uri.  Diagnoses and all orders for this visit:  Acute bronchitis, unspecified organism Symptomatic care discussed. Medications as prescribed. Report any new or worsening symptoms.  -     doxycycline (MONODOX) 100 MG capsule; Take 1 capsule (100 mg total) by mouth 2 (two) times daily for 7 days. -     predniSONE (DELTASONE) 20 MG tablet; 2 po at sametime daily  for 5 days -     chlorpheniramine-HYDROcodone (TUSSIONEX) 10-8 MG/5ML SUER; Take 5 mLs by mouth every 12 (twelve) hours as needed for cough.  Wheezing -     predniSONE (DELTASONE) 20 MG tablet; 2 po at sametime daily for 5 days     Plan:    Discussed diagnosis and treatment of URI. Suggested symptomatic OTC remedies. Nasal saline spray for congestion. Doxycycline per orders. Follow up as needed. Call in 3 days if symptoms aren't resolving.   Return if symptoms worsen or fail to improve.  The above assessment and management plan was discussed with the patient. The patient verbalized understanding of and has agreed to the management plan. Patient is aware to call the clinic if symptoms fail to improve or worsen. Patient is aware when to return to the clinic for a follow-up visit. Patient educated on when it is appropriate to go to the emergency department.   Monia Pouch, FNP-C [facility] [address] [place], [place] [address] [phone]

## 2019-01-21 DIAGNOSIS — G4733 Obstructive sleep apnea (adult) (pediatric): Secondary | ICD-10-CM | POA: Diagnosis not present

## 2019-01-23 ENCOUNTER — Ambulatory Visit: Payer: Self-pay | Admitting: Family Medicine

## 2019-01-28 ENCOUNTER — Encounter: Payer: Self-pay | Admitting: Family Medicine

## 2019-01-28 ENCOUNTER — Ambulatory Visit (INDEPENDENT_AMBULATORY_CARE_PROVIDER_SITE_OTHER): Payer: Medicare HMO | Admitting: Licensed Clinical Social Worker

## 2019-01-28 ENCOUNTER — Ambulatory Visit (INDEPENDENT_AMBULATORY_CARE_PROVIDER_SITE_OTHER): Payer: Medicare HMO | Admitting: Family Medicine

## 2019-01-28 VITALS — BP 162/85 | HR 82 | Temp 97.9°F | Ht 62.0 in | Wt 180.2 lb

## 2019-01-28 DIAGNOSIS — E785 Hyperlipidemia, unspecified: Secondary | ICD-10-CM | POA: Diagnosis not present

## 2019-01-28 DIAGNOSIS — I10 Essential (primary) hypertension: Secondary | ICD-10-CM

## 2019-01-28 DIAGNOSIS — G43111 Migraine with aura, intractable, with status migrainosus: Secondary | ICD-10-CM | POA: Diagnosis not present

## 2019-01-28 DIAGNOSIS — F411 Generalized anxiety disorder: Secondary | ICD-10-CM

## 2019-01-28 DIAGNOSIS — G43709 Chronic migraine without aura, not intractable, without status migrainosus: Secondary | ICD-10-CM

## 2019-01-28 MED ORDER — ONDANSETRON 8 MG PO TBDP: 8.0000 mg | ORAL_TABLET | Freq: Four times a day (QID) | ORAL | 1 refills | Status: DC | PRN

## 2019-01-28 MED ORDER — TOPIRAMATE 50 MG PO TABS: ORAL_TABLET | ORAL | 1 refills | Status: DC

## 2019-01-28 MED ORDER — BETAMETHASONE SOD PHOS & ACET 6 (3-3) MG/ML IJ SUSP
6.0000 mg | Freq: Once | INTRAMUSCULAR | Status: AC
  Administered 2019-01-28: 6 mg via INTRAMUSCULAR

## 2019-01-28 MED ORDER — KETOROLAC TROMETHAMINE 60 MG/2ML IM SOLN
60.0000 mg | Freq: Once | INTRAMUSCULAR | Status: AC
  Administered 2019-01-28: 60 mg via INTRAMUSCULAR

## 2019-01-28 NOTE — Progress Notes (Signed)
Subjective:  Patient ID: Yvonne [name], female    DOB: 09-24-1951  Age: 68 y.o. MRN: [id number]  CC: Migraine (pt here today c/o migraine x 5 days)   HPI [name] presents for constant severe 8/10 pain with photophobia unrelieved for 5 days. Unable to function due to severity. It is accompanied by nausea. No relief with multiple OC pain relievers. It hurts just to brush her hair.  Depression screen [facility] 2/9 01/28/2019 01/20/2019 12/05/2018  Decreased Interest 2 2 2   Down, Depressed, Hopeless 1 2 2   PHQ - 2 Score 3 4 4   Altered sleeping 2 2 2   Tired, decreased energy 2 1 2   Change in appetite 3 2 3   Feeling bad or failure about yourself  1 1 2   Trouble concentrating 1 3 2   Moving slowly or fidgety/restless 2 2 1   Suicidal thoughts 0 0 0  PHQ-9 Score 14 15 16   Some recent data might be hidden    History Yvonne [name] has a past medical history of Anxiety, Arthritis, Asthma, Chronic cough, Chronic otitis media (12/2017), Full dentures, GERD (gastroesophageal reflux disease), History of stroke (09/2017), Hyperlipidemia, Hypertension, Non-insulin dependent type 2 diabetes mellitus ([place]), Overactive bladder, Recurrent acute suppurative otitis media without spontaneous rupture of left tympanic membrane (02/19/2018), and Transient cerebral ischemia.   She has a past surgical history that includes Cholecystectomy; Colonoscopy (N/A, 08/17/2015); Abdominal hysterectomy; Lacrimal duct exploration (Bilateral); Cataract extraction w/ intraocular lens implant (Right); Vitrectomy (Left, 09/14/2015); Gas insertion (Right); and Myringotomy with tube placement (Bilateral, 01/28/2018).   Her family history includes Arthritis in her brother and mother; Arthritis-Osteo in her son; Asthma in her father; CVA in her mother; Cancer in her brother; Diabetes in her father and mother; Heart disease in her father; Hepatitis C in her sister; Peripheral Artery Disease in her daughter; Stroke in her mother.She reports that she  has never smoked. She has never used smokeless tobacco. She reports that she does not drink alcohol or use drugs.    ROS Review of Systems  Constitutional: Negative.   HENT: Negative for congestion.   Eyes: Positive for photophobia.  Respiratory: Negative for shortness of breath.   Cardiovascular: Negative for chest pain.  Gastrointestinal: Negative for abdominal pain, constipation, diarrhea, nausea and vomiting.  Genitourinary: Negative for difficulty urinating.  Musculoskeletal: Negative for arthralgias and myalgias.  Neurological: Positive for headaches. Negative for numbness.  Psychiatric/Behavioral: Negative for sleep disturbance.    Objective:  BP (!) 162/85   Pulse 82   Temp 97.9 F (36.6 C) (Oral)   Ht 5\' 2"  (1.575 m)   Wt 180 lb 4 oz (81.8 kg)   BMI 32.97 kg/m   BP Readings from Last 3 Encounters:  01/28/19 (!) 162/85  01/20/19 (!) 162/85  12/12/18 (!) 175/71    Wt Readings from Last 3 Encounters:  01/28/19 180 lb 4 oz (81.8 kg)  01/20/19 179 lb (81.2 kg)  12/12/18 177 lb 7.5 oz (80.5 kg)     Physical Exam Constitutional:      General: She is not in acute distress.    Appearance: She is well-developed.  HENT:     Head: Normocephalic and atraumatic.     Right Ear: External ear normal.     Left Ear: External ear normal.     Nose: Nose normal.  Eyes:     Extraocular Movements: Extraocular movements intact.     Conjunctiva/sclera: Conjunctivae normal.     Pupils: Pupils are equal, round, and reactive to  light.  Neck:     Musculoskeletal: Normal range of motion and neck supple.     Thyroid: No thyromegaly.  Cardiovascular:     Rate and Rhythm: Normal rate and regular rhythm.     Heart sounds: Normal heart sounds. No murmur.  Pulmonary:     Effort: Pulmonary effort is normal. No respiratory distress.     Breath sounds: Normal breath sounds. No wheezing or rales.  Abdominal:     General: Bowel sounds are normal. There is no distension.      Palpations: Abdomen is soft.     Tenderness: There is no abdominal tenderness.  Lymphadenopathy:     Cervical: No cervical adenopathy.  Skin:    General: Skin is warm and dry.  Neurological:     Mental Status: She is alert and oriented to person, place, and time.     Deep Tendon Reflexes: Reflexes are normal and symmetric.  Psychiatric:        Behavior: Behavior normal.        Thought Content: Thought content normal.        Judgment: Judgment normal.       Assessment & Plan:   Nanako was seen today for migraine.  Diagnoses and all orders for this visit:  Intractable migraine with aura with status migrainosus -     betamethasone acetate-betamethasone sodium phosphate (CELESTONE) injection 6 mg -     ketorolac (TORADOL) injection 60 mg  Essential hypertension  Other orders -     ondansetron (ZOFRAN-ODT) 8 MG disintegrating tablet; Take 1 tablet (8 mg total) by mouth every 6 (six) hours as needed for nausea or vomiting. -     topiramate (TOPAMAX) 50 MG tablet; 1 nightly times 1 week.  Then 2 nightly for 1 week.  Then 3 nightly for 1 week.  Then 4 nightly.       I have discontinued [name]. Churchill's topiramate, predniSONE, and chlorpheniramine-HYDROcodone. I am also having her start on ondansetron and topiramate. Additionally, I am having her maintain her albuterol, atorvastatin, ezetimibe, fluticasone furoate-vilanterol, furosemide, pantoprazole, amLODipine, ALPRAZolam, metFORMIN, loratadine, Belbuca, and aspirin EC. We administered betamethasone acetate-betamethasone sodium phosphate and ketorolac.  Allergies as of 01/28/2019      Reactions   Penicillins Hives   Has patient had a PCN reaction causing immediate rash, facial/tongue/throat swelling, SOB or lightheadedness with hypotension: Yes Has patient had a PCN reaction causing severe rash involving mucus membranes or skin necrosis: Unk Has patient had a PCN reaction that required hospitalization: No Has patient had a PCN  reaction occurring within the last 10 years: No If all of the above answers are "NO", then may proceed with Cephalosporin use.   Gabapentin Other (See Comments)   Causes a lot of lethargy at a higher dose   Lisinopril Cough   Soap Rash   DIAL soap      Medication List       Accurate as of January 28, 2019 11:59 PM. Always use your most recent med list.        albuterol 108 (90 Base) MCG/ACT inhaler Commonly known as:  Ventolin HFA Inhale 2 puffs into the lungs every 6 (six) hours as needed for wheezing or shortness of breath.   ALPRAZolam 1 MG tablet Commonly known as:  XANAX TAKE 1 TABLET (1MG  TOTAL) 3 (THREE)TIMES DAILY BY MOUTH   amLODipine 10 MG tablet Commonly known as:  NORVASC TAKE 1 TABLET (5 MG TOTAL) BY MOUTH DAILY.   aspirin EC  325 MG tablet Take 1 tablet (325 mg total) by mouth daily.   atorvastatin 40 MG tablet Commonly known as:  LIPITOR TAKE 1 TABLET EVERY DAY AT 6PM   Belbuca 150 MCG Film Generic drug:  Buprenorphine HCl Place 150 mcg under the tongue 2 (two) times daily.   ezetimibe 10 MG tablet Commonly known as:  Zetia Take 1 tablet (10 mg total) by mouth daily. For cholesterol   fluticasone furoate-vilanterol 100-25 MCG/INH Aepb Commonly known as:  Breo Ellipta Inhale 1 puff into the lungs daily.   furosemide 20 MG tablet Commonly known as:  LASIX Take 1 tablet (20 mg total) by mouth daily.   loratadine 10 MG tablet Commonly known as:  CLARITIN Take 10 mg by mouth daily.   metFORMIN 750 MG 24 hr tablet Commonly known as:  GLUCOPHAGE-XR Take 1 tablet (750 mg total) by mouth 2 (two) times daily.   ondansetron 8 MG disintegrating tablet Commonly known as:  ZOFRAN-ODT Take 1 tablet (8 mg total) by mouth every 6 (six) hours as needed for nausea or vomiting.   pantoprazole 40 MG tablet Commonly known as:  PROTONIX Take 1 tablet (40 mg total) by mouth 2 (two) times daily. For stomach   topiramate 50 MG tablet Commonly known as:  TOPAMAX 1  nightly times 1 week.  Then 2 nightly for 1 week.  Then 3 nightly for 1 week.  Then 4 nightly.        Follow-up: Return in about 1 month (around 02/28/2019).  [name], M.D.

## 2019-01-28 NOTE — Patient Instructions (Signed)
Licensed Clinical Water engineer provided:  No  Goals we discussed today:   Current Barriers:  Limited social support  Spouse of client is ill  Financial issues  Goal:  Patient Stated : "I want to get help in managing stress and anxiety"  Clinical Social Work Clinical Goal(s):Over the next 30 days, Thi will talk with [name] regarding management of anxiety and stress symptoms of client.  Interventions:  [name] interviewed client related to client psychosocial needs.  [name] encouraged client to use relaxation techniques of choice to manage anxiety and stress symptoms.  [name] encouraged client to talk with [name] to discuss stress/anxiety symptoms experienced regularly by client.  [name] provided counseling support for client  Patient Self Care Activities:  Client attends medical appointments  Client speaks with sister daily for emotional support   Client enjoys reading as a way to manage stress.  Plan:  Client to call [name] as needed to discuss anxiety and stress symptoms of client.  [name] to call client inthreeweeks to discuss relaxation techniques usage by client (reading, gardening in flowers)  Client to read her favorite books as ways to help her relax,manage stress  Client to communicate as needed with [name] Kirby Funk related to nursing needs of client  Client to engage in self care activities of choice  Client to talk regularly with her sister as a means of emotional support  *initial goal documentation  Follow Up Plan:[name] to call client inthreeweeks to discuss relaxation techniques of use to client to help her manage anxiety and stress symptoms.  The patient verbalized understanding of instructions provided today and declined a print copy of patient instruction materials.   [name].Jaymari Cromie MSW, [name] Licensed Clinical Social Worker [facility] Management [phone]

## 2019-01-28 NOTE — Chronic Care Management (AMB) (Signed)
  Chronic Care Management    Clinical Social Work General Follow Up Note  01/28/2019 Name: Yvonne [name] MRN: [id number] DOB: 11/19/1951  Yvonne [name] is a 68 y.o. year old female who is a primary care patient of [name], [name], MD. Dr. [name] asked the CCM team to consult the patient for assistance with psychosocial assessment and counseling support.   Review of patient status, including review of consultants reports, relevant laboratory and other test results, and collaboration with appropriate care team members and the patient's provider was performed as part of comprehensive patient evaluation and provision of chronic care management services.    Depression screen PHQ 2/9 01/20/2019  Decreased Interest 2  Down, Depressed, Hopeless 2  PHQ - 2 Score 4  Altered sleeping 2  Tired, decreased energy 1  Change in appetite 2  Feeling bad or failure about yourself  1  Trouble concentrating 3  Moving slowly or fidgety/restless 2  Suicidal thoughts 0  PHQ-9 Score 15  Some recent data might be hidden     GAD 7 : Generalized Anxiety Score 12/05/2018 01/22/2018 04/30/2016 11/04/2015  Nervous, Anxious, on Edge 3 2 1  0  Control/stop worrying 3 3 3 1   Worry too much - different things 3 1 3 3   Trouble relaxing 3 1 3 3   Restless 2 1 2 2   Easily annoyed or irritable 2 1 2 2   Afraid - awful might happen 3 1 0 1  Total GAD 7 Score 19 10 14 12   Anxiety Difficulty Extremely difficult - Somewhat difficult -    Social Determinants of Health: At risk for depression and at risk for stress   Current Barriers:  Limited social support  Spouse of client is ill  Financial issues  Goal:  Patient Stated : "I want to get help in managing stress and anxiety"  Clinical Social Work Clinical Goal(s):Over the next 30 days, Yvonne [name] will talk with [name] regarding management of anxiety and stress symptoms of client.  Interventions:  [name] interviewed client related to client psychosocial needs.  [name]  encouraged client to use relaxation techniques of choice to manage anxiety and stress symptoms.  [name] encouraged client to talk with [name] to discuss stress/anxiety symptoms experienced regularly by client.  [name] provided counseling support for client  Patient Self Care Activities:  Client attends medical appointments  Client speaks with sister daily for emotional support   Client enjoys reading as a way to manage stress.  Plan:  Client to call [name] as needed to discuss anxiety and stress symptoms of client.  [name] to call client in three weeks to discuss relaxation techniques usage by client (reading, gardening in flowers)  Client to read her favorite books as ways to help her relax,manage stress  Client to communicate as needed with [name] Yvonne [name] related to nursing needs of client  Client to engage in self care activities of choice  Client to talk regularly with her sister as a means of emotional support  *initial goal documentation  Follow Up Plan:[name] to call client in three weeks to discuss relaxation techniques of use to client to help her manage anxiety and stress symptoms.  Yvonne [name].Yvonne [name] MSW, [name] Licensed Clinical Social Worker [facility] [phone]

## 2019-02-07 ENCOUNTER — Encounter: Payer: Self-pay | Admitting: Family Medicine

## 2019-02-17 ENCOUNTER — Ambulatory Visit: Payer: Self-pay | Admitting: Licensed Clinical Social Worker

## 2019-02-17 DIAGNOSIS — J441 Chronic obstructive pulmonary disease with (acute) exacerbation: Secondary | ICD-10-CM

## 2019-02-17 DIAGNOSIS — E785 Hyperlipidemia, unspecified: Secondary | ICD-10-CM

## 2019-02-17 DIAGNOSIS — I10 Essential (primary) hypertension: Secondary | ICD-10-CM

## 2019-02-17 DIAGNOSIS — E119 Type 2 diabetes mellitus without complications: Secondary | ICD-10-CM

## 2019-02-17 DIAGNOSIS — F411 Generalized anxiety disorder: Secondary | ICD-10-CM

## 2019-02-17 NOTE — Patient Instructions (Signed)
Licensed Clinical Social Worker Visit Information  Goals we discussed today:  Goals Addressed            This Visit's Progress   . Client stated: "I want to get help in managing stress and anxiety" (pt-stated)       Current Barriers:  [name] Kitchen Mental Health Concerns   . Financial challenges . Spouse of client is ill  Clinical Social Work Clinical Goal(s):  [name] Kitchen Over the next 30 days, Veronica will talk with [name] regarding management of anxiety and stress symptoms of client  Interventions: . Patient interviewed and appropriate assessments performed  . Encouraged client to use relaxation techniques of choice to manage anxiety and stress symptoms . Encouraged client to talk regularly with [name] to discuss anxiety and stress symptoms experienced by client . Provided counseling support for client  Patient Self Care Activities:  . Attends all scheduled provider appointments  . Client speaks daily with her sister for emotional support  .  Client enjoys reading as a way to manage stress   Plan: Client to call [name] as needed to discuss anxiety and stress issues of client [name] to call client in next 3 weeks to discuss relaxation techniques use of client (reading, gardening in flowers) Client to read favorite books as a way to help her manage stress Client to communicate with [name] to discuss client's nursing needs. Client to participate in self care activities of choice Client to communicate with her sister as a means of emotional support.  Initial goal documentation     Materials Provided: No  Follow Up Plan: [name] to call client in next 3 weeks to discuss relaxation techniques use of client to help client manage anxiety and stress symptoms faced  The patient verbalized understanding of instructions provided today and declined a print copy of patient instruction materials.   [name].Ramah Langhans MSW, [name] Licensed Clinical Social Worker [facility]  Management [phone]

## 2019-02-17 NOTE — Chronic Care Management (AMB) (Signed)
  Care Management Note   Yvonne [name] is a 68 y.o. year old female who is a primary care patient of [name], [name], MD. The CM team was consulted for assistance with chronic disease management and care coordination.   I reached out to [name] by phone today.   Review of patient status, including review of consultants reports, relevant laboratory and other test results, and collaboration with appropriate care team members and the patient's provider was performed as part of comprehensive patient evaluation and provision of chronic care management services.   GAD 7 : Generalized Anxiety Score 12/05/2018 01/22/2018 04/30/2016 11/04/2015  Nervous, Anxious, on Edge 3 2 1  0  Control/stop worrying 3 3 3 1   Worry too much - different things 3 1 3 3   Trouble relaxing 3 1 3 3   Restless 2 1 2 2   Easily annoyed or irritable 2 1 2 2   Afraid - awful might happen 3 1 0 1  Total GAD 7 Score 19 10 14 12   Anxiety Difficulty Extremely difficult - Somewhat difficult -    Depression screen [facility] 2/9 01/28/2019 01/20/2019 12/05/2018  Decreased Interest 2 2 2   Down, Depressed, Hopeless 1 2 2   PHQ - 2 Score 3 4 4   Altered sleeping 2 2 2   Tired, decreased energy 2 1 2   Change in appetite 3 2 3   Feeling bad or failure about yourself  1 1 2   Trouble concentrating 1 3 2   Moving slowly or fidgety/restless 2 2 1   Suicidal thoughts 0 0 0  PHQ-9 Score 14 15 16   Some recent data might be hidden   Social Determinants of Health: At risk for Depression; at Risks for Stress  Goals Addressed            This Visit's Progress   . Client stated: "I want to get help in managing stress and anxiety" (pt-stated)       Current Barriers:  [name] Kitchen Mental Health Concerns   . Financial challenges . Spouse of client is ill  Clinical Social Work Clinical Goal(s):  [name] Kitchen Over the next 30 days, Shyonna will talk with [name] regarding management of anxiety and stress symptoms of client  Interventions: . Patient interviewed and  appropriate assessments performed  . Encouraged client to use relaxation techniques of choice to manage anxiety and stress symptoms . Encouraged client to talk regularly with [name] to discuss anxiety and stress symptoms experienced by client . Provided counseling support for client  Patient Self Care Activities:  . Attends all scheduled provider appointments  . Client speaks daily with her sister for emotional support  .  Client enjoys reading as a way to manage stress   Plan: Client to call [name] as needed to discuss anxiety and stress issues of client [name] to call client in next 3 weeks to discuss relaxation techniques use of client (reading, gardening in flowers) Client to read favorite books as a way to help her manage stress Client to communicate with [name] to discuss client's nursing needs. Client to participate in self care activities of choice Client to communicate with her sister as a means of emotional support.  Initial goal documentation    Follow Up Plan: [name] to call client in next 3 weeks to discuss relaxation techniques use by client to help her manage anxiety and stress symptoms faced.  [name].Spiro Ausborn MSW, [name] Licensed Clinical Social Worker [facility] [phone]

## 2019-02-18 ENCOUNTER — Other Ambulatory Visit: Payer: Self-pay

## 2019-02-18 ENCOUNTER — Telehealth: Payer: Medicare HMO | Admitting: Family Medicine

## 2019-02-18 DIAGNOSIS — J441 Chronic obstructive pulmonary disease with (acute) exacerbation: Secondary | ICD-10-CM

## 2019-02-18 MED ORDER — LORATADINE 10 MG PO TABS: 10.0000 mg | ORAL_TABLET | Freq: Every day | ORAL | 3 refills | Status: DC

## 2019-02-18 NOTE — Progress Notes (Signed)
Pt. requersted refill of OTC med claritin in order to get insurance to cover. No visit required for this.

## 2019-02-19 ENCOUNTER — Other Ambulatory Visit: Payer: Self-pay

## 2019-02-19 ENCOUNTER — Ambulatory Visit (INDEPENDENT_AMBULATORY_CARE_PROVIDER_SITE_OTHER): Payer: Medicare HMO | Admitting: Family Medicine

## 2019-02-19 ENCOUNTER — Encounter: Payer: Self-pay | Admitting: Family Medicine

## 2019-02-19 DIAGNOSIS — J069 Acute upper respiratory infection, unspecified: Secondary | ICD-10-CM

## 2019-02-19 DIAGNOSIS — G4733 Obstructive sleep apnea (adult) (pediatric): Secondary | ICD-10-CM | POA: Diagnosis not present

## 2019-02-19 MED ORDER — PSEUDOEPH-BROMPHEN-DM 30-2-10 MG/5ML PO SYRP: 5.0000 mL | ORAL_SOLUTION | Freq: Four times a day (QID) | ORAL | 0 refills | Status: DC | PRN

## 2019-02-19 NOTE — Progress Notes (Signed)
Virtual Visit via telephone Note  I connected with Yvonne [name] on 02/19/19 at 1130 by telephone and verified that I am speaking with the correct person using two identifiers. Yvonne [name] is currently located at home and family is currently with them during visit. The provider, Monia Pouch, FNP is located in their office at time of visit.  I discussed the limitations, risks, security and privacy concerns of performing an evaluation and management service by telephone and the availability of in person appointments. I also discussed with the patient that there may be a patient responsible charge related to this service. The patient expressed understanding and agreed to proceed.  Subjective:  Patient ID: Yvonne [name], female    DOB: March 16, 1951, 68 y.o.   MRN: [id number]  Chief Complaint:  Cough, congestion   HPI: Yvonne [name] is a 68 y.o. female presenting on 02/19/2019 for cough, congestion   Pt reports cough, congestion, and runny nose. Pt states this started this morning. She denies fever, chills, shortness of breath, chest pain, palpitations, fatigue, or weakness. No recent travel. States her grandchildren have had colds.    Relevant past medical, surgical, family, and social history reviewed and updated as indicated.  Allergies and medications reviewed and updated.   Past Medical History:  Diagnosis Date  . Anxiety   . Arthritis    left hand  . Asthma    daily and prn inhalers  . Chronic cough   . Chronic otitis media 12/2017  . Full dentures   . GERD (gastroesophageal reflux disease)   . History of stroke 09/2017   weakness right hand, numbness right side face  . Hyperlipidemia   . Hypertension    states under control with meds., has been on med. x a long time, per pt.  . Non-insulin dependent type 2 diabetes mellitus ([place])   . Overactive bladder   . Recurrent acute suppurative otitis media without spontaneous rupture of left tympanic membrane 02/19/2018  . Transient  cerebral ischemia     Past Surgical History:  Procedure Laterality Date  . ABDOMINAL HYSTERECTOMY     complete  . CATARACT EXTRACTION W/ INTRAOCULAR LENS IMPLANT Right   . CHOLECYSTECTOMY    . COLONOSCOPY N/A 08/17/2015   Procedure: COLONOSCOPY;  Surgeon: Rogene Houston, MD;  Location: [facility];  Service: Endoscopy;  Laterality: N/A;  200 - moved to 7:30 - Ann notified pt  . GAS INSERTION Right    x 2 - eye  . LACRIMAL DUCT EXPLORATION Bilateral    removal of tear ducts  . MYRINGOTOMY WITH TUBE PLACEMENT Bilateral 01/28/2018   Procedure: BILATERAL MYRINGOTOMY WITH TUBE PLACEMENT;  Surgeon: Leta Baptist, MD;  Location: [facility];  Service: ENT;  Laterality: Bilateral;  . VITRECTOMY Left 09/14/2015    Social History   Socioeconomic History  . Marital status: Married    Spouse name: Not on file  . Number of children: 3  . Years of education: Not on file  . Highest education level: GED or equivalent  Occupational History  . Occupation: retired    Comment: Customer service manager and restaurants  Social Needs  . Financial resource strain: Somewhat hard  . Food insecurity:    Worry: Never true    Inability: Never true  . Transportation needs:    Medical: No    Non-medical: No  Tobacco Use  . Smoking status: Never Smoker  . Smokeless tobacco: Never Used  Substance and Sexual Activity  .  Alcohol use: No  . Drug use: No  . Sexual activity: Yes  Lifestyle  . Physical activity:    Days per week: 0 days    Minutes per session: 0 min  . Stress: Very much  Relationships  . Social connections:    Talks on phone: More than three times a week    Gets together: More than three times a week    Attends religious service: Never    Active member of club or organization: Yes    Attends meetings of clubs or organizations: More than 4 times per year    Relationship status: Married  . Intimate partner violence:    Fear of current or ex partner: No    Emotionally abused:  No    Physically abused: No    Forced sexual activity: No  Other Topics Concern  . Not on file  Social History Narrative   Lives at home home with husband with son, daughter in law and 3 children.  [name], his girlfriend and her child also moved in 10/2018      Disabled.    Outpatient Encounter Medications as of 02/19/2019  Medication Sig  . albuterol (VENTOLIN HFA) 108 (90 Base) MCG/ACT inhaler Inhale 2 puffs into the lungs every 6 (six) hours as needed for wheezing or shortness of breath.  . ALPRAZolam (XANAX) 1 MG tablet TAKE 1 TABLET (1MG  TOTAL) 3 (THREE)TIMES DAILY BY MOUTH (Patient taking differently: Take 1 mg by mouth 3 (three) times daily. )  . amLODipine (NORVASC) 10 MG tablet TAKE 1 TABLET (5 MG TOTAL) BY MOUTH DAILY. (Patient taking differently: Take 10 mg by mouth every morning. )  . aspirin EC 325 MG tablet Take 1 tablet (325 mg total) by mouth daily.  [name] Kitchen atorvastatin (LIPITOR) 40 MG tablet TAKE 1 TABLET EVERY DAY AT 6PM (Patient taking differently: Take 40 mg by mouth every evening. )  . BELBUCA 150 MCG FILM Place 150 mcg under the tongue 2 (two) times daily.   . brompheniramine-pseudoephedrine-DM 30-2-10 MG/5ML syrup Take 5 mLs by mouth 4 (four) times daily as needed.  . ezetimibe (ZETIA) 10 MG tablet Take 1 tablet (10 mg total) by mouth daily. For cholesterol (Patient taking differently: Take 10 mg by mouth every evening. For cholesterol)  . fluticasone furoate-vilanterol (BREO ELLIPTA) 100-25 MCG/INH AEPB Inhale 1 puff into the lungs daily.  . furosemide (LASIX) 20 MG tablet Take 1 tablet (20 mg total) by mouth daily.  [name] Kitchen loratadine (CLARITIN) 10 MG tablet Take 1 tablet (10 mg total) by mouth daily.  . metFORMIN (GLUCOPHAGE-XR) 750 MG 24 hr tablet Take 1 tablet (750 mg total) by mouth 2 (two) times daily.  . ondansetron (ZOFRAN-ODT) 8 MG disintegrating tablet Take 1 tablet (8 mg total) by mouth every 6 (six) hours as needed for nausea or vomiting.  . pantoprazole (PROTONIX)  40 MG tablet Take 1 tablet (40 mg total) by mouth 2 (two) times daily. For stomach  . topiramate (TOPAMAX) 50 MG tablet 1 nightly times 1 week.  Then 2 nightly for 1 week.  Then 3 nightly for 1 week.  Then 4 nightly.   No facility-administered encounter medications on file as of 02/19/2019.     Allergies  Allergen Reactions  . Penicillins Hives    Has patient had a PCN reaction causing immediate rash, facial/tongue/throat swelling, SOB or lightheadedness with hypotension: Yes Has patient had a PCN reaction causing severe rash involving mucus membranes or skin necrosis: Unk Has patient had a  PCN reaction that required hospitalization: No Has patient had a PCN reaction occurring within the last 10 years: No If all of the above answers are "NO", then may proceed with Cephalosporin use.   . Gabapentin Other (See Comments)    Causes a lot of lethargy at a higher dose  . Lisinopril Cough  . Soap Rash    DIAL soap    Review of Systems  Constitutional: Negative for activity change, appetite change, chills, fatigue and fever.  HENT: Positive for congestion, postnasal drip, rhinorrhea and sore throat. Negative for sinus pressure, sinus pain, sneezing, tinnitus, trouble swallowing and voice change.   Respiratory: Positive for cough. Negative for chest tightness and shortness of breath.   Cardiovascular: Negative for chest pain, palpitations and leg swelling.  Neurological: Negative for weakness and headaches.  Psychiatric/Behavioral: Negative for confusion.  All other systems reviewed and are negative.        Observations/Objective: Pt alert and oriented, answers all questions appropriately, and able to speak in full sentences.    Assessment and Plan: Diagnoses and all orders for this visit:  URI with cough and congestion Symptomatic care discussed. Medications as prescribed. Report any new or worsening symptoms. -     brompheniramine-pseudoephedrine-DM 30-2-10 MG/5ML syrup; Take 5  mLs by mouth 4 (four) times daily as needed.     Follow Up Instructions: Report any new or worsening.     I discussed the assessment and treatment plan with the patient. The patient was provided an opportunity to ask questions and all were answered. The patient agreed with the plan and demonstrated an understanding of the instructions.   The patient was advised to call back or seek an in-person evaluation if the symptoms worsen or if the condition fails to improve as anticipated.  The above assessment and management plan was discussed with the patient. The patient verbalized understanding of and has agreed to the management plan. Patient is aware to call the clinic if symptoms persist or worsen. Patient is aware when to return to the clinic for a follow-up visit. Patient educated on when it is appropriate to go to the emergency department.    I provided 12 minutes of non-face-to-face time during this encounter. The call started at 1130. The call ended at 1142.   Monia Pouch, FNP-[facility] [address] [place], [place] [address] [phone]

## 2019-02-20 ENCOUNTER — Other Ambulatory Visit: Payer: Self-pay | Admitting: Family Medicine

## 2019-02-25 ENCOUNTER — Ambulatory Visit (INDEPENDENT_AMBULATORY_CARE_PROVIDER_SITE_OTHER): Payer: Medicare HMO | Admitting: Family Medicine

## 2019-02-25 ENCOUNTER — Encounter: Payer: Self-pay | Admitting: Family Medicine

## 2019-02-25 ENCOUNTER — Other Ambulatory Visit: Payer: Self-pay

## 2019-02-25 ENCOUNTER — Ambulatory Visit (INDEPENDENT_AMBULATORY_CARE_PROVIDER_SITE_OTHER): Payer: Medicare HMO | Admitting: Licensed Clinical Social Worker

## 2019-02-25 DIAGNOSIS — G43709 Chronic migraine without aura, not intractable, without status migrainosus: Secondary | ICD-10-CM

## 2019-02-25 DIAGNOSIS — E119 Type 2 diabetes mellitus without complications: Secondary | ICD-10-CM | POA: Diagnosis not present

## 2019-02-25 DIAGNOSIS — F411 Generalized anxiety disorder: Secondary | ICD-10-CM

## 2019-02-25 DIAGNOSIS — J441 Chronic obstructive pulmonary disease with (acute) exacerbation: Secondary | ICD-10-CM | POA: Diagnosis not present

## 2019-02-25 DIAGNOSIS — E785 Hyperlipidemia, unspecified: Secondary | ICD-10-CM | POA: Diagnosis not present

## 2019-02-25 DIAGNOSIS — I10 Essential (primary) hypertension: Secondary | ICD-10-CM

## 2019-02-25 MED ORDER — CEFUROXIME AXETIL 500 MG PO TABS: 500.0000 mg | ORAL_TABLET | Freq: Two times a day (BID) | ORAL | 0 refills | Status: AC

## 2019-02-25 MED ORDER — HYDROCODONE-HOMATROPINE 5-1.5 MG/5ML PO SYRP: 5.0000 mL | ORAL_SOLUTION | Freq: Four times a day (QID) | ORAL | 0 refills | Status: AC | PRN

## 2019-02-25 MED ORDER — TOPIRAMATE 200 MG PO TABS: 200.0000 mg | ORAL_TABLET | Freq: Every day | ORAL | 2 refills | Status: DC

## 2019-02-25 NOTE — Progress Notes (Signed)
Subjective:  Patient ID: Yvonne [name], female    DOB: May 17, 1951  Age: 68 y.o. MRN: [id number]  CC: No chief complaint on file.   HPI KEYMANI GLYNN presents for coughing, can't sleep at night due to cough. A little sputum is yellow. Getting worse. Denies sore throat. Not dyspneic. Had sweats last night but no fever.  Eyes running. Mucousy.   No HA since starting topiramate. Denies drowsiness, numbness tingling or other side effects.   Six days ago had virtual visit. Chart review revealed tx for URI based on same day onset of cough and congestion.Pt. given OTC advice at that time. Has ventolin inhaler and Breo Ellipta due to chronic cough of COPD.  Started on topiramate last month as headache prophylaxis .     History Angila has a past medical history of Anxiety, Arthritis, Asthma, Chronic cough, Chronic otitis media (12/2017), Full dentures, GERD (gastroesophageal reflux disease), History of stroke (09/2017), Hyperlipidemia, Hypertension, Non-insulin dependent type 2 diabetes mellitus ([place]), Overactive bladder, Recurrent acute suppurative otitis media without spontaneous rupture of left tympanic membrane (02/19/2018), and Transient cerebral ischemia.   She has a past surgical history that includes Cholecystectomy; Colonoscopy (N/A, 08/17/2015); Abdominal hysterectomy; Lacrimal duct exploration (Bilateral); Cataract extraction w/ intraocular lens implant (Right); Vitrectomy (Left, 09/14/2015); Gas insertion (Right); and Myringotomy with tube placement (Bilateral, 01/28/2018).   Her family history includes Arthritis in her brother and mother; Arthritis-Osteo in her son; Asthma in her father; CVA in her mother; Cancer in her brother; Diabetes in her father and mother; Heart disease in her father; Hepatitis C in her sister; Peripheral Artery Disease in her daughter; Stroke in her mother.She reports that she has never smoked. She has never used smokeless tobacco. She reports that she does not drink  alcohol or use drugs.    ROS Review of Systems  Constitutional: Negative for activity change, appetite change, chills and fever.  HENT: Positive for congestion, postnasal drip and rhinorrhea. Negative for ear discharge, ear pain, hearing loss, nosebleeds, sinus pressure, sneezing and trouble swallowing.   Respiratory: Positive for cough. Negative for chest tightness and shortness of breath.   Cardiovascular: Negative for chest pain and palpitations.  Skin: Negative for rash.  Neurological: Negative for headaches.    Objective:  There were no vitals taken for this visit.  BP Readings from Last 3 Encounters:  01/28/19 (!) 162/85  01/20/19 (!) 162/85  12/12/18 (!) 175/71    Wt Readings from Last 3 Encounters:  01/28/19 180 lb 4 oz (81.8 kg)  01/20/19 179 lb (81.2 kg)  12/12/18 177 lb 7.5 oz (80.5 kg)     Physical Exam  Phone visit, deferred  Assessment & Plan:   Diagnoses and all orders for this visit:  Acute exacerbation of chronic obstructive pulmonary disease (COPD) ([place])  Chronic migraine without aura without status migrainosus, not intractable  Other orders -     topiramate (TOPAMAX) 200 MG tablet; Take 1 tablet (200 mg total) by mouth at bedtime. -     HYDROcodone-homatropine (HYCODAN) 5-1.5 MG/5ML syrup; Take 5 mLs by mouth every 6 (six) hours as needed for up to 5 days for cough. -     cefUROXime (CEFTIN) 500 MG tablet; Take 1 tablet (500 mg total) by mouth 2 (two) times daily with a meal for 10 days.       I have changed [name]. Cossey's topiramate. I am also having her start on HYDROcodone-homatropine and cefUROXime. Additionally, I am having her maintain her albuterol,  atorvastatin, ezetimibe, fluticasone furoate-vilanterol, furosemide, pantoprazole, amLODipine, ALPRAZolam, metFORMIN, Belbuca, aspirin EC, ondansetron, loratadine, and brompheniramine-pseudoephedrine-DM.  Allergies as of 02/25/2019      Reactions   Penicillins Hives   Has patient had a PCN  reaction causing immediate rash, facial/tongue/throat swelling, SOB or lightheadedness with hypotension: Yes Has patient had a PCN reaction causing severe rash involving mucus membranes or skin necrosis: Unk Has patient had a PCN reaction that required hospitalization: No Has patient had a PCN reaction occurring within the last 10 years: No If all of the above answers are "NO", then may proceed with Cephalosporin use.   Gabapentin Other (See Comments)   Causes a lot of lethargy at a higher dose   Lisinopril Cough   Soap Rash   DIAL soap      Medication List       Accurate as of February 25, 2019  3:48 PM. Always use your most recent med list.        albuterol 108 (90 Base) MCG/ACT inhaler Commonly known as:  Ventolin HFA Inhale 2 puffs into the lungs every 6 (six) hours as needed for wheezing or shortness of breath.   ALPRAZolam 1 MG tablet Commonly known as:  XANAX TAKE 1 TABLET (1MG  TOTAL) 3 (THREE)TIMES DAILY BY MOUTH   amLODipine 10 MG tablet Commonly known as:  NORVASC TAKE 1 TABLET (5 MG TOTAL) BY MOUTH DAILY.   aspirin EC 325 MG tablet Take 1 tablet (325 mg total) by mouth daily.   atorvastatin 40 MG tablet Commonly known as:  LIPITOR TAKE 1 TABLET EVERY DAY AT 6PM   Belbuca 150 MCG Film Generic drug:  Buprenorphine HCl Place 150 mcg under the tongue 2 (two) times daily.   brompheniramine-pseudoephedrine-DM 30-2-10 MG/5ML syrup Take 5 mLs by mouth 4 (four) times daily as needed.   cefUROXime 500 MG tablet Commonly known as:  CEFTIN Take 1 tablet (500 mg total) by mouth 2 (two) times daily with a meal for 10 days.   ezetimibe 10 MG tablet Commonly known as:  Zetia Take 1 tablet (10 mg total) by mouth daily. For cholesterol   fluticasone furoate-vilanterol 100-25 MCG/INH Aepb Commonly known as:  Breo Ellipta Inhale 1 puff into the lungs daily.   furosemide 20 MG tablet Commonly known as:  LASIX Take 1 tablet (20 mg total) by mouth daily.     HYDROcodone-homatropine 5-1.5 MG/5ML syrup Commonly known as:  HYCODAN Take 5 mLs by mouth every 6 (six) hours as needed for up to 5 days for cough.   loratadine 10 MG tablet Commonly known as:  CLARITIN Take 1 tablet (10 mg total) by mouth daily.   metFORMIN 750 MG 24 hr tablet Commonly known as:  GLUCOPHAGE-XR Take 1 tablet (750 mg total) by mouth 2 (two) times daily.   ondansetron 8 MG disintegrating tablet Commonly known as:  ZOFRAN-ODT Take 1 tablet (8 mg total) by mouth every 6 (six) hours as needed for nausea or vomiting.   pantoprazole 40 MG tablet Commonly known as:  PROTONIX Take 1 tablet (40 mg total) by mouth 2 (two) times daily. For stomach   topiramate 200 MG tablet Commonly known as:  TOPAMAX Take 1 tablet (200 mg total) by mouth at bedtime.      Virtual Visit via telephone Note  I discussed the limitations, risks, security and privacy concerns of performing an evaluation and management service by telephone and the availability of in person appointments. I also discussed with the patient that there may  be a patient responsible charge related to this service. The patient expressed understanding and agreed to proceed. Pt. Is at home. Dr. [name] is in his office. Verified Date of Birth.  Follow Up Instructions:   I discussed the assessment and treatment plan with the patient. The patient was provided an opportunity to ask questions and all were answered. The patient agreed with the plan and demonstrated an understanding of the instructions.   The patient was advised to call back or seek an in-person evaluation if the symptoms worsen or if the condition fails to improve as anticipated.  Visit started: 2:24 Call ended:  3:44 Total minutes including chart review and phone contact time:20:00   Follow-up: Return in about 3 months (around 05/27/2019).  [name], M.D.

## 2019-02-25 NOTE — Patient Instructions (Signed)
Licensed Clinical Social Worker Visit Information  Goals we discussed today:  Goals Addressed            This Visit's Progress   . Client stated: "I want to get help in managing stress and anxiety" (pt-stated)       Current Barriers:  [name] Kitchen Mental Health Concerns   . Financial challenges . Spouse of client is ill  Clinical Social Work Clinical Goal(s):  [name] Kitchen Over the next 30 days, Landra will talk with [name] regarding management of anxiety and stress symptoms of client  Interventions: . Encouraged client to use relaxation techniques of choice to manage anxiety and stress symptoms . Encouraged client to talk regularly with [name] to discuss anxiety and stress symptoms experienced by client . Encouraged client to talk with [name] as needed to discuss nursing needs of client  Patient Self Care Activities:  . Attends all scheduled provider appointments  . Client speaks daily with her sister for emotional support  .  Client enjoys reading as a way to manage stress   Plan: Client to call [name] as needed to discuss anxiety and stress issues of client [name] to call client in next 3 weeks to discuss relaxation techniques use of client (reading, gardening in flowers) Client to read favorite books as a way to help her manage stress Client to communicate with [name] to discuss client's nursing needs. Client to participate in self care activities of choice Client to communicate with her sister as a means of emotional support.  Initial goal documentation    Materials Provided: No  Follow Up Plan: [name] to call client in next 3 weeks to discuss relaxation techniques use of client (reading, gardening in flowers) to help her  manage anxiety/stress symptoms.  The patient verbalized understanding of instructions provided today and declined a print copy of patient instruction materials.   [name].Nicol Herbig MSW, [name] Licensed Clinical Social Worker [facility]  [facility] [phone]

## 2019-02-25 NOTE — Chronic Care Management (AMB) (Signed)
  Care Management Note   Yvonne [name] is a 68 y.o. year old female who is a primary care patient of [name], [name], MD. The CM team was consulted for assistance with chronic disease management and care coordination.   I reached out to [name] by phone today.   Review of patient status, including review of consultants reports, relevant laboratory and other test results, and collaboration with appropriate care team members and the patient's provider was performed as part of comprehensive patient evaluation and provision of chronic care management services.   Social Determinants of Health: At risk for Depression; At risk for Stress.   Goals Addressed            This Visit's Progress   . Client stated: "I want to get help in managing stress and anxiety" (pt-stated)       Current Barriers:  [name] Kitchen Mental Health Concerns   . Financial challenges . Spouse of client is ill  Clinical Social Work Clinical Goal(s):  [name] Kitchen Over the next 30 days, [name] will talk with [name] regarding management of anxiety and stress symptoms of client  Interventions: . Encouraged client to use relaxation techniques of choice to manage anxiety and stress symptoms . Encouraged client to talk regularly with [name] to discuss anxiety and stress symptoms experienced by client . Encouraged client to talk with [name] as needed to discuss nursing needs of client  Patient Self Care Activities:  . Attends all scheduled provider appointments  . Client speaks daily with her sister for emotional support  .  Client enjoys reading as a way to manage stress   Plan: Client to call [name] as needed to discuss anxiety and stress issues of client [name] to call client in next 3 weeks to discuss relaxation techniques use of client (reading, gardening in flowers) Client to read favorite books as a way to help her manage stress Client to communicate with [name] to discuss client's nursing needs. Client to participate in self care  activities of choice Client to communicate with her sister as a means of emotional support.  Initial goal documentation    [name] talked with client about large family group living at her home. Client has said that such a large group at her residence can be stressful to client.  [name] has talked with client about self care activities and [name] has encouraged client to care for herself and participate in activities that help her relax and manage stress/anxiety symptoms.  Follow Up Plan: [name] to call client in next 3 weeks to discuss client use of relaxation techniques of choice  [name].Matheo Rathbone MSW, [name] Licensed Clinical Social Worker [facility] [phone]

## 2019-03-10 ENCOUNTER — Other Ambulatory Visit: Payer: Self-pay | Admitting: Family Medicine

## 2019-03-10 ENCOUNTER — Ambulatory Visit: Payer: Self-pay | Admitting: Licensed Clinical Social Worker

## 2019-03-10 DIAGNOSIS — F411 Generalized anxiety disorder: Secondary | ICD-10-CM

## 2019-03-10 DIAGNOSIS — E119 Type 2 diabetes mellitus without complications: Secondary | ICD-10-CM

## 2019-03-10 DIAGNOSIS — I10 Essential (primary) hypertension: Secondary | ICD-10-CM

## 2019-03-10 DIAGNOSIS — E785 Hyperlipidemia, unspecified: Secondary | ICD-10-CM

## 2019-03-10 DIAGNOSIS — J441 Chronic obstructive pulmonary disease with (acute) exacerbation: Secondary | ICD-10-CM

## 2019-03-10 DIAGNOSIS — G43709 Chronic migraine without aura, not intractable, without status migrainosus: Secondary | ICD-10-CM

## 2019-03-10 NOTE — Chronic Care Management (AMB) (Signed)
  Care Management Note   NATSHA GUIDRY is a 68 y.o. year old female who is a primary care patient of [name], [name], MD. The CM team was consulted for assistance with chronic disease management and care coordination.   I reached out to [name] by phone today.   Review of patient status, including review of consultants reports, relevant laboratory and other test results, and collaboration with appropriate care team members and the patient's provider was performed as part of comprehensive patient evaluation and provision of chronic care management services.   Social Determinants of Health:Risk for Depression; Risk for Stress.  GAD 7 : Generalized Anxiety Score 12/05/2018 01/22/2018 04/30/2016 11/04/2015  Nervous, Anxious, on Edge 3 2 1  0  Control/stop worrying 3 3 3 1   Worry too much - different things 3 1 3 3   Trouble relaxing 3 1 3 3   Restless 2 1 2 2   Easily annoyed or irritable 2 1 2 2   Afraid - awful might happen 3 1 0 1  Total GAD 7 Score 19 10 14 12   Anxiety Difficulty Extremely difficult - Somewhat difficult -    Goals Addressed            This Visit's Progress   . Client stated: "I want to get help in managing stress and anxiety" (pt-stated)       Current Barriers:  [name] Kitchen Mental Health Concerns   . Financial challenges . Spouse of client is ill  Clinical Social Work Clinical Goal(s):  [name] Kitchen Over the next 30 days, Summerlyn will talk with [name] regarding management of anxiety and stress symptoms of client  Interventions: . Encouraged client to use relaxation techniques of choice to manage anxiety and stress symptoms . Encouraged client to talk regularly with [name] to discuss anxiety and stress symptoms experienced by client . Encouraged client to talk with [name] as needed to discuss nursing needs of client . Encouraged client to talk with her sister as a means of emotional support  Patient Self Care Activities:  . Attends all scheduled provider appointments  . Client speaks  daily with her sister for emotional support  .  Client enjoys reading as a way to manage stress   Plan: Client to call [name] as needed to discuss anxiety and stress issues of client [name] to call client in next 3 weeks to discuss relaxation techniques use of client (reading, gardening in flowers) Client to read favorite books as a way to help her manage stress Client to communicate with [name] to discuss client's nursing needs. Client to participate in self care activities of choice Client to communicate with her sister as a means of emotional support.  Initial goal documentation    Client said she communicates regularly with her sister and that this support is helpful. Client spoke of planting flowers and doing outdoor activities recently. Client said she had her prescribed medications  Follow Up Plan: [name] to call client in next 3 weeks to talk with client about her use of relaxation techniques to help her manage symptoms faced  [name].Noely Kuhnle MSW, [name] Licensed Clinical Social Worker [facility] [phone]

## 2019-03-10 NOTE — Patient Instructions (Signed)
Licensed Clinical Social Worker Visit Information  Goals we discussed today:  Goals Addressed            This Visit's Progress   . Client stated: "I want to get help in managing stress and anxiety" (pt-stated)       Current Barriers:  [name] Kitchen Mental Health Concerns   . Financial challenges . Spouse of client is ill  Clinical Social Work Clinical Goal(s):  [name] Kitchen Over the next 30 days, Yvonne [name] will talk with [name] regarding management of anxiety and stress symptoms of client  Interventions: . Encouraged client to use relaxation techniques of choice to manage anxiety and stress symptoms . Encouraged client to talk regularly with [name] to discuss anxiety and stress symptoms experienced by client . Encouraged client to talk with [name] as needed to discuss nursing needs of client . Encouraged client to talk with her sister as a means of emotional support  Patient Self Care Activities:  . Attends all scheduled provider appointments  . Client speaks daily with her sister for emotional support  .  Client enjoys reading as a way to manage stress   Plan: Client to call [name] as needed to discuss anxiety and stress issues of client [name] to call client in next 3 weeks to discuss relaxation techniques use of client (reading, gardening in flowers) Client to read favorite books as a way to help her manage stress Client to communicate with [name] to discuss client's nursing needs. Client to participate in self care activities of choice Client to communicate with her sister as a means of emotional support.  Initial goal documentation      Materials Provided: No  Follow Up Plan: [name] to call client in next 3 weeks to discuss relaxation techniques use of client (reading, gardening in flowers)  The patient verbalized understanding of instructions provided today and declined a print copy of patient instruction materials.   [name].Otilia Kareem MSW, [name] Licensed Clinical Social Worker [facility] [phone]

## 2019-03-13 ENCOUNTER — Ambulatory Visit: Payer: Medicare HMO | Admitting: *Deleted

## 2019-03-13 ENCOUNTER — Other Ambulatory Visit: Payer: Self-pay

## 2019-03-17 NOTE — Chronic Care Management (AMB) (Signed)
  Chronic Care Management   Note  03/13/2019 Name: Yvonne [name] MRN: [id number] DOB: 1951/06/10  Yvonne [name] is a 68 year old primary care patient of Dr [name] who was referred for CCM services to help management her chronic medical conditions and coordinate care. She has been talking with Legrand Como "Scott" Forrest, [name] with the [facility] CCM Team regarding her psychosocial needs and he recommended that she speak with me regarding her nursing care needs.   I was unable to speak with Ms Derryberry by telephone but I did leave a voicemail requesting that she return my call.   Follow up plan: The CM team will reach out to the patient again over the next 7 days.   [name], RN-BC, BSN Nurse Case Manager [facility] [phone]

## 2019-03-22 DIAGNOSIS — G4733 Obstructive sleep apnea (adult) (pediatric): Secondary | ICD-10-CM | POA: Diagnosis not present

## 2019-03-31 ENCOUNTER — Ambulatory Visit: Payer: Self-pay | Admitting: Licensed Clinical Social Worker

## 2019-03-31 DIAGNOSIS — J441 Chronic obstructive pulmonary disease with (acute) exacerbation: Secondary | ICD-10-CM

## 2019-03-31 DIAGNOSIS — E119 Type 2 diabetes mellitus without complications: Secondary | ICD-10-CM

## 2019-03-31 DIAGNOSIS — E785 Hyperlipidemia, unspecified: Secondary | ICD-10-CM

## 2019-03-31 DIAGNOSIS — I10 Essential (primary) hypertension: Secondary | ICD-10-CM

## 2019-03-31 DIAGNOSIS — G43709 Chronic migraine without aura, not intractable, without status migrainosus: Secondary | ICD-10-CM

## 2019-03-31 DIAGNOSIS — F411 Generalized anxiety disorder: Secondary | ICD-10-CM

## 2019-03-31 NOTE — Chronic Care Management (AMB) (Signed)
  Care Management Note   Yvonne [name] is a 68 y.o. year old female who is a primary care patient of [name], [name], MD. The CM team was consulted for assistance with chronic disease management and care coordination.   I reached out to Yvonne [name] by phone today.   Review of patient status, including review of consultants reports, relevant laboratory and other test results, and collaboration with appropriate care team members and the patient's provider was performed as part of comprehensive patient evaluation and provision of chronic care management services.   Social Determinants of Health:Risk for Depression; Risk for Stress  Depression screen [facility] 2/9 01/28/2019 01/20/2019 12/05/2018  Decreased Interest 2 2 2   Down, Depressed, Hopeless 1 2 2   PHQ - 2 Score 3 4 4   Altered sleeping 2 2 2   Tired, decreased energy 2 1 2   Change in appetite 3 2 3   Feeling bad or failure about yourself  1 1 2   Trouble concentrating 1 3 2   Moving slowly or fidgety/restless 2 2 1   Suicidal thoughts 0 0 0  PHQ-9 Score 14 15 16   Some recent data might be hidden   GAD 7 : Generalized Anxiety Score 12/05/2018 01/22/2018 04/30/2016 11/04/2015  Nervous, Anxious, on Edge 3 2 1  0  Control/stop worrying 3 3 3 1   Worry too much - different things 3 1 3 3   Trouble relaxing 3 1 3 3   Restless 2 1 2 2   Easily annoyed or irritable 2 1 2 2   Afraid - awful might happen 3 1 0 1  Total GAD 7 Score 19 10 14 12   Anxiety Difficulty Extremely difficult - Somewhat difficult -    Goals Addressed            This Visit's Progress   . Client stated: "I want to get help in managing stress and anxiety" (pt-stated)       Current Barriers:  [name] Kitchen Mental Health Concerns   . Financial challenges . Spouse of client is ill  Clinical Social Work Clinical Goal(s):  [name] Kitchen Over the next 30 days, Yvonne [name] will talk with [name] regarding management of anxiety and stress symptoms of client  Interventions: . Encouraged client to use relaxation  techniques of choice to manage anxiety and stress symptoms . Encouraged client to talk regularly with [name] to discuss anxiety and stress symptoms experienced by client . Encouraged client to talk with [name] as needed to discuss nursing needs of client . Encouraged client to talk with her sister as a means of emotional support  Patient Self Care Activities:  . Attends all scheduled provider appointments  . Client speaks daily with her sister for emotional support  .  Client enjoys reading as a way to manage stress   Plan: [name] to call client in next 3 weeks to discuss relaxation techniques use of client (reading, gardening in flowers) Client to read favorite books as a way to help her manage stress Client to communicate with [name] to discuss client's nursing needs. Client to participate in self care activities of choice Client to communicate with her sister as a means of emotional support. Client to attend scheduled medical appointments  Initial goal documentation     Follow Up Plan: [name] to call client in next 3 weeks to talk with client about her use of relaxation techniques to help her manage anxiety/stress symptoms faced.  Yvonne [name].Tysheka Fanguy MSW, [name] Licensed Clinical Social Worker [facility] [phone]

## 2019-03-31 NOTE — Patient Instructions (Signed)
Licensed Clinical Social Worker Visit Information  Goals we discussed today:  Goals Addressed            This Visit's Progress   . Client stated: "I want to get help in managing stress and anxiety" (pt-stated)       Current Barriers:  [name] Kitchen Mental Health Concerns   . Financial challenges . Spouse of client is ill  Clinical Social Work Clinical Goal(s):  [name] Kitchen Over the next 30 days, Laquitha will talk with [name] regarding management of anxiety and stress symptoms of client  Interventions: . Encouraged client to use relaxation techniques of choice to manage anxiety and stress symptoms . Encouraged client to talk regularly with [name] to discuss anxiety and stress symptoms experienced by client . Encouraged client to talk with [name] as needed to discuss nursing needs of client . Encouraged client to talk with her sister as a means of emotional support  Patient Self Care Activities:  . Attends all scheduled provider appointments  . Client speaks daily with her sister for emotional support  .  Client enjoys reading as a way to manage stress   Plan: Client to call [name] as needed to discuss anxiety and stress issues of client [name] to call client in next 3 weeks to discuss relaxation techniques use of client (reading, gardening in flowers) Client to read favorite books as a way to help her manage stress Client to communicate with [name] to discuss client's nursing needs. Client to participate in self care activities of choice Client to communicate with her sister as a means of emotional support. Client to attend scheduled medical appointments  Initial goal documentation      Materials Provided: No  Follow Up Plan: [name] to call client in next 3 weeks to discuss relaxation techniques use of client (reading,  gardening in flowers)    The patient verbalized understanding of instructions provided today and declined a print copy of patient instruction materials.   [name].Joyceline Maiorino MSW,  [name] Licensed Clinical Social Worker [facility] [phone]

## 2019-04-06 ENCOUNTER — Ambulatory Visit (INDEPENDENT_AMBULATORY_CARE_PROVIDER_SITE_OTHER): Payer: Medicare HMO

## 2019-04-06 ENCOUNTER — Ambulatory Visit (INDEPENDENT_AMBULATORY_CARE_PROVIDER_SITE_OTHER): Payer: Medicare HMO | Admitting: Family

## 2019-04-06 ENCOUNTER — Encounter: Payer: Self-pay | Admitting: Family

## 2019-04-06 ENCOUNTER — Other Ambulatory Visit: Payer: Self-pay

## 2019-04-06 VITALS — BP 171/92 | HR 83 | Temp 98.1°F | Ht 62.0 in | Wt 182.6 lb

## 2019-04-06 DIAGNOSIS — Y92009 Unspecified place in unspecified non-institutional (private) residence as the place of occurrence of the external cause: Secondary | ICD-10-CM

## 2019-04-06 DIAGNOSIS — S99911A Unspecified injury of right ankle, initial encounter: Secondary | ICD-10-CM | POA: Diagnosis not present

## 2019-04-06 DIAGNOSIS — W19XXXA Unspecified fall, initial encounter: Secondary | ICD-10-CM | POA: Diagnosis not present

## 2019-04-06 DIAGNOSIS — M7989 Other specified soft tissue disorders: Secondary | ICD-10-CM | POA: Diagnosis not present

## 2019-04-06 DIAGNOSIS — S40021A Contusion of right upper arm, initial encounter: Secondary | ICD-10-CM

## 2019-04-06 DIAGNOSIS — M25471 Effusion, right ankle: Secondary | ICD-10-CM

## 2019-04-06 MED ORDER — DICLOFENAC SODIUM 75 MG PO TBEC: 75.0000 mg | DELAYED_RELEASE_TABLET | Freq: Two times a day (BID) | ORAL | 0 refills | Status: DC

## 2019-04-06 NOTE — Progress Notes (Signed)
Subjective:    Patient ID: Yvonne [name], female    DOB: November 09, 1951, 68 y.o.   MRN: [id number]  Chief Complaint  Patient presents with  . ankle ,shoulder pain from fall     HPI PT presents to the office today with right forearm pain and right ankle pain that occurred after a fall. She states last week she fell in her yard and twisted her right ankle. States her pain in her ankle is an intermittent 8 out 10. She has taken tylenol with mild relief.   She states Saturday she fell in her kitchen against the wall and hit her right forearm. She reports intermittent aching pain of 6 out 10. She has taken tylenol with mild relief.   Review of Systems  Musculoskeletal: Positive for arthralgias.  All other systems reviewed and are negative.      Objective:   Physical Exam Vitals signs reviewed.  Constitutional:      General: She is not in acute distress.    Appearance: She is well-developed.  HENT:     Head: Normocephalic and atraumatic.  Neck:     Musculoskeletal: Normal range of motion and neck supple.     Thyroid: No thyromegaly.  Cardiovascular:     Rate and Rhythm: Normal rate and regular rhythm.     Heart sounds: Normal heart sounds. No murmur.  Pulmonary:     Effort: Pulmonary effort is normal. No respiratory distress.     Breath sounds: Normal breath sounds. No wheezing.  Abdominal:     General: Bowel sounds are normal. There is no distension.     Palpations: Abdomen is soft.     Tenderness: There is no abdominal tenderness.  Musculoskeletal:        General: Swelling and tenderness present.     Comments: Right forearm ecchymosis and swelling, right lateral ankle swelling. Pain with flexion or extension.   Skin:    General: Skin is warm and dry.     Findings: Ecchymosis present.  Neurological:     Mental Status: She is alert and oriented to person, place, and time.     Cranial Nerves: No cranial nerve deficit.     Deep Tendon Reflexes: Reflexes are normal and  symmetric.  Psychiatric:        Behavior: Behavior normal.        Thought Content: Thought content normal.        Judgment: Judgment normal.     BP (!) 171/92   Pulse 83   Temp 98.1 F (36.7 C) (Oral)   Ht 5\' 2"  (1.575 m)   Wt 182 lb 9.6 oz (82.8 kg)   BMI 33.40 kg/m        Assessment & Plan:  Yvonne [name] comes in today with chief complaint of ankle ,shoulder pain from fall   Diagnosis and orders addressed:  1. Fall in home, initial encounter - diclofenac (VOLTAREN) 75 MG EC tablet; Take 1 tablet (75 mg total) by mouth 2 (two) times daily.  Dispense: 30 tablet; Refill: 0 - DG Ankle Complete Right; Future  2. Right ankle swelling - diclofenac (VOLTAREN) 75 MG EC tablet; Take 1 tablet (75 mg total) by mouth 2 (two) times daily.  Dispense: 30 tablet; Refill: 0 - DG Ankle Complete Right; Future  3. Contusion of right upper extremity, initial encounter - diclofenac (VOLTAREN) 75 MG EC tablet; Take 1 tablet (75 mg total) by mouth 2 (two) times daily.  Dispense: 30 tablet; Refill: 0  Rest Ice Start Diclofenac BID with food Keep elevated Fall preventions discussed  Follow up plan: Keep follow up with PCP  Evelina Dun, FNP

## 2019-04-06 NOTE — Patient Instructions (Signed)
Hematoma  A hematoma is a collection of blood under the skin, in an organ, in a body space, in a joint space, or in other tissue. The blood can thicken (clot) to form a lump that you can see and feel. The lump is often firm and may become sore and tender. Most hematomas get better in a few days to weeks. However, some hematomas may be serious and require medical care. Hematomas can range from very small to very large.  What are the causes?  This condition is caused by:   A blunt or penetrating injury.   A leakage from a blood vessel under the skin.   Some medical procedures, including surgeries, such as oral surgery, face lifts, and surgeries on the joints.   Some medical conditions that cause bleeding or bruising. There may be multiple hematomas that appear in different areas of the body.  What increases the risk?  You are more likely to develop this condition if:   You are an older adult.   You use blood thinners.  What are the signs or symptoms?    Symptoms of this condition depend on where the hematoma is located.   Common symptoms of a hematoma that is under the skin include:   A firm lump on the body.   Pain and tenderness in the area.   Bruising. Blue, dark blue, purple-red, or yellowish skin (discoloration) may appear at the site of the hematoma if the hematoma is close to the surface of the skin.  Common symptoms of a hematoma that is deep in the tissues or body spaces may be less obvious. They include:   A collection of blood in the stomach (intra-abdominal hematoma). This may cause pain in the abdomen, weakness, fainting, and shortness of breath.   A collection of blood in the head (intracranial hematoma). This may cause a headache or symptoms such as weakness, trouble speaking or understanding, or a change in consciousness.  How is this diagnosed?  This condition is diagnosed based on:   Your medical history.   A physical exam.   Imaging tests, such as an ultrasound or CT scan. These may  be needed if your health care provider suspects a hematoma in deeper tissues or body spaces.   Blood tests. These may be needed if your health care provider believes that the hematoma is caused by a medical condition.  How is this treated?  Treatment for this condition depends on the cause, size, and location of the hematoma. Treatment may include:   Doing nothing. The majority of hematomas do not need treatment as many of them go away on their own over time.   Surgery or close monitoring. This may be needed for large hematomas or hematomas that affect vital organs.   Medicines. Medicines may be given if there is an underlying medical cause for the hematoma.  Follow these instructions at home:  Managing pain, stiffness, and swelling     If directed, put ice on the affected area.  ? Put ice in a plastic bag.  ? Place a towel between your skin and the bag.  ? Leave the ice on for 20 minutes, 2-3 times a day for the first couple of days.   If directed, apply heat to the affected area after applying ice for a couple of days. Use the heat source that your health care provider recommends, such as a moist heat pack or a heating pad.  ? Place a towel between your skin   and the heat source.  ? Leave the heat on for 20-30 minutes.  ? Remove the heat if your skin turns bright red. This is especially important if you are unable to feel pain, heat, or cold. You may have a greater risk of getting burned.   Raise (elevate) the affected area above the level of your heart while you are sitting or lying down.   If told, wrap the affected area with an elastic bandage. The bandage applies pressure (compression) to the area, which may help to reduce swelling and promote healing. Do not wrap the bandage too tightly around the affected area.   If your hematoma is on a leg or foot (lower extremity) and is painful, your health care provider may recommend crutches. Use them as told by your health care provider.  General  instructions   Take over-the-counter and prescription medicines only as told by your health care provider.   Keep all follow-up visits as told by your health care provider. This is important.  Contact a health care provider if:   You have a fever.   The swelling or discoloration gets worse.   You develop more hematomas.  Get help right away if:   Your pain is worse or your pain is not controlled with medicine.   Your skin over the hematoma breaks or starts bleeding.   Your hematoma is in your chest or abdomen and you have weakness, shortness of breath, or a change in consciousness.   You have a hematoma on your scalp that is caused by a fall or injury, and you also have:  ? A headache that gets worse.  ? Trouble speaking or understanding speech.  ? Weakness.  ? Change in alertness or consciousness.  Summary   A hematoma is a collection of blood under the skin, in an organ, in a body space, in a joint space, or in other tissue.   This condition usually does not need treatment because many hematomas go away on their own over time.   Large hematomas, or those that may affect vital organs, may need surgical drainage or monitoring. If the hematoma is caused by a medical condition, medicines may be prescribed.   Get help right away if your hematoma breaks or starts to bleed, you have shortness of breath, or you have a headache or trouble speaking after a fall.  This information is not intended to replace advice given to you by your health care provider. Make sure you discuss any questions you have with your health care provider.  Document Released: 06/26/2004 Document Revised: 04/17/2018 Document Reviewed: 04/17/2018  Elsevier Interactive Patient Education  2019 [employer]

## 2019-04-09 ENCOUNTER — Ambulatory Visit (INDEPENDENT_AMBULATORY_CARE_PROVIDER_SITE_OTHER): Payer: Medicare HMO | Admitting: Licensed Clinical Social Worker

## 2019-04-09 DIAGNOSIS — I10 Essential (primary) hypertension: Secondary | ICD-10-CM | POA: Diagnosis not present

## 2019-04-09 DIAGNOSIS — F411 Generalized anxiety disorder: Secondary | ICD-10-CM

## 2019-04-09 DIAGNOSIS — E785 Hyperlipidemia, unspecified: Secondary | ICD-10-CM | POA: Diagnosis not present

## 2019-04-09 DIAGNOSIS — J441 Chronic obstructive pulmonary disease with (acute) exacerbation: Secondary | ICD-10-CM

## 2019-04-09 DIAGNOSIS — G43709 Chronic migraine without aura, not intractable, without status migrainosus: Secondary | ICD-10-CM

## 2019-04-09 DIAGNOSIS — E119 Type 2 diabetes mellitus without complications: Secondary | ICD-10-CM

## 2019-04-09 NOTE — Patient Instructions (Addendum)
Licensed Clinical Social Worker Visit Information   Materials Provided: No   [name] and client have spoken about medical needs of spouse of client.[name] and client have spoken about client management of stress and anxiety symptoms. Client has said that she had numerous family members living at her home and that this home situation at present is stressful at times. [name] has talked with client about use of relaxation techniques to help her manage stress and anxiety faced. Client said she likes to read, likes being outdoors, likes planting flowers and gardening and likes spending time with her grandchildren. [name] has talked with client about self care activities for client. [name] has encouraged client to talk with [name] as needed to discuss nursing needs of client. Client has phone number of [name] [phone]) for client to call [name] as needed for psychosocial support  Follow Up Plan: [name] to call client in next 3 weeks to talk with client about relaxation techniques client  can use to help her manage anxiety and stress symptoms faced.  The patient verbalized understanding of instructions provided today and declined a print copy of patient instruction materials.   [name].Devanee Pomplun MSW, [name] Licensed Clinical Social Worker [facility] [phone]

## 2019-04-09 NOTE — Chronic Care Management (AMB) (Signed)
  Care Management Note   Yvonne [name] is a 68 y.o. year old female who is a primary care patient of [name], [name], MD. The CM team was consulted for assistance with chronic disease management and care coordination.   I reached out to [name] by phone today.   Review of patient status, including review of consultants reports, relevant laboratory and other test results, and collaboration with appropriate care team members and the patient's provider was performed as part of comprehensive patient evaluation and provision of chronic care management services.   Social Determinants of Health:Risk for Stress; Risk for Depression    Office Visit from 04/06/2019 in [facility]  PHQ-9 Total Score  12     GAD 7 : Generalized Anxiety Score 12/05/2018 01/22/2018 04/30/2016 11/04/2015  Nervous, Anxious, on Edge 3 2 1  0  Control/stop worrying 3 3 3 1   Worry too much - different things 3 1 3 3   Trouble relaxing 3 1 3 3   Restless 2 1 2 2   Easily annoyed or irritable 2 1 2 2   Afraid - awful might happen 3 1 0 1  Total GAD 7 Score 19 10 14 12   Anxiety Difficulty Extremely difficult - Somewhat difficult -   [name] and client have spoken about medical needs of spouse of client.[name] and client have spoken about client management of stress and anxiety symptoms. Client has said that she had numerous family members living at her home and that this home situation at present is stressful at times. [name] has talked with client about use of relaxation techniques to help her manage stress and anxiety faced. Client said she likes to read, likes being outdoors, likes planting flowers and gardening and likes spending time with her grandchildren. [name] has talked with client about self care activities for client. [name] has encouraged client to talk with [name] as needed to discuss nursing needs of client. Client has phone number of [name] [phone]) for client to call [name] as needed for psychosocial  support  Follow Up Plan: [name] to call client in next 3 weeks to talk with client about relaxation techniques client  can use to help her manage anxiety and stress symptoms faced.  [name].[name] MSW, [name] Licensed Clinical Social Worker [facility] Care [facility] [phone]

## 2019-04-21 ENCOUNTER — Ambulatory Visit: Payer: Self-pay | Admitting: Licensed Clinical Social Worker

## 2019-04-21 DIAGNOSIS — G4733 Obstructive sleep apnea (adult) (pediatric): Secondary | ICD-10-CM | POA: Diagnosis not present

## 2019-04-21 DIAGNOSIS — G43709 Chronic migraine without aura, not intractable, without status migrainosus: Secondary | ICD-10-CM

## 2019-04-21 DIAGNOSIS — F411 Generalized anxiety disorder: Secondary | ICD-10-CM

## 2019-04-21 DIAGNOSIS — J441 Chronic obstructive pulmonary disease with (acute) exacerbation: Secondary | ICD-10-CM

## 2019-04-21 DIAGNOSIS — E785 Hyperlipidemia, unspecified: Secondary | ICD-10-CM

## 2019-04-21 DIAGNOSIS — E119 Type 2 diabetes mellitus without complications: Secondary | ICD-10-CM

## 2019-04-21 DIAGNOSIS — I10 Essential (primary) hypertension: Secondary | ICD-10-CM

## 2019-04-21 NOTE — Patient Instructions (Signed)
Licensed Clinical Social Worker Visit Information  Goals we discussed today:  Goals Addressed            This Visit's Progress   . Client stated: "I want to get help in managing stress and anxiety" (pt-stated)       Current Barriers:  [name] Kitchen Mental Health Concerns   . Financial challenges . Spouse of client is ill  Clinical Social Work Clinical Goal(s):  [name] Kitchen Over the next 30 days, [place] will talk with [name] regarding management of anxiety and stress symptoms of client  Interventions: . Encouraged client to use relaxation techniques of choice to manage anxiety and stress symptoms . Encouraged client to talk regularly with [name] to discuss anxiety and stress symptoms experienced by client . Encouraged client to talk with [name] as needed to discuss nursing needs of client . Encouraged client to talk with her sister as a means of emotional support  Patient Self Care Activities:  . Attends all scheduled provider appointments  . Client speaks daily with her sister for emotional support  .  Client enjoys reading as a way to manage stress   Plan: Client to call [name] as needed to discuss anxiety and stress issues of client [name] to call client in next 3 weeks to discuss relaxation techniques use of client (reading, gardening in flowers, listening to music) Client to communicate with [name] to discuss client's nursing needs. Client to participate in self care activities of choice Client to communicate with her sister as a means of emotional support. Client to attend scheduled medical appointments  Initial goal documentation      Materials Provided: No  Follow Up Plan: [name] to call client in next 3 weeks to discuss relaxation techniques use of client (reading, gardening in flowers, listening to music)  The patient verbalized understanding of instructions provided today and declined a print copy of patient instruction materials.   [name].Darrian Grzelak MSW, [name] Licensed Clinical Social  Worker [facility] Care Management [phone]

## 2019-04-21 NOTE — Chronic Care Management (AMB) (Signed)
Care Management Note   Yvonne [name] is a 68 y.o. year old female who is a primary care patient of Yvonne [name], Yvonne Gash, MD. The CM team was consulted for assistance with chronic disease management and care coordination.   I reached out to Yvonne [name] by phone today.   Review of patient status, including review of consultants reports, relevant laboratory and other test results, and collaboration with appropriate care team members and the patient's provider was performed as part of comprehensive patient evaluation and provision of chronic care management services.   Social Determinants of Health: Risk for Depression; Risk for Stress    Office Visit from 04/06/2019 in [facility]  PHQ-9 Total Score  12     GAD 7 : Generalized Anxiety Score 12/05/2018 01/22/2018 04/30/2016 11/04/2015  Nervous, Anxious, on Edge 3 2 1  0  Control/stop worrying 3 3 3 1   Worry too much - different things 3 1 3 3   Trouble relaxing 3 1 3 3   Restless 2 1 2 2   Easily annoyed or irritable 2 1 2 2   Afraid - awful might happen 3 1 0 1  Total GAD 7 Score 19 10 14 12   Anxiety Difficulty Extremely difficult - Somewhat difficult -    Goals Addressed            This Visit's Progress   . Client stated: "I want to get help in managing stress and anxiety" (pt-stated)       Current Barriers:  Yvonne [name] Kitchen Mental Health Concerns   . Financial challenges . Spouse of client is ill  Clinical Social Work Clinical Goal(s):  Yvonne [name] Kitchen Over the next 30 days, Yvonne [name] will talk with Yvonne [name] regarding management of anxiety and stress symptoms of client  Interventions: . Encouraged client to use relaxation techniques of choice to manage anxiety and stress symptoms . Encouraged client to talk regularly with Yvonne [name] to discuss anxiety and stress symptoms experienced by client . Encouraged client to talk with [name] as needed to discuss nursing needs of client . Encouraged client to talk with her sister as a means of emotional support   Patient Self Care Activities:  . Attends all scheduled provider appointments  . Client speaks daily with her sister for emotional support  .  Client enjoys reading as a way to manage stress   Plan: Client to call Yvonne [name] as needed to discuss anxiety and stress issues of client Yvonne [name] to call client in next 3 weeks to discuss relaxation techniques use of client (reading, gardening in flowers) Client to communicate with [name] to discuss client's nursing needs. Client to participate in self care activities of choice Client to communicate with her sister as a means of emotional support. Client to attend scheduled medical appointments  Initial goal documentation      Yvonne [name] and client have spoken previously about medical needs of spouse of client.Yvonne [name] and client have spoken about client management of stress and anxiety symptoms. Client has said that she had numerous family members living at her home and that this home situation at present is stressful at times. Yvonne [name] has talked with client about use of relaxation techniques to help her manage stress and anxiety faced. Client said she likes to read, likes being outdoors, likes planting flowers and gardening and likes spending time with her grandchildren. Yvonne [name] has talked with client about self care activities for client. Yvonne [name] has encouraged client to take time to care for herself daily. Yvonne [name] has encouraged client to  talk with [name] as needed to discuss nursing needs of client. Client has phone number of Yvonne [name] [phone]) for client to call Yvonne [name] as needed for psychosocial support . Client spoke of health needs of her son. She said she is stressed related to health needs of her son.and that her son is scheduled to have surgery on his leg.on May 06, 2019. She said she has her prescribed medications.  She said she has appointment with Dr. Livia [name] in June of 2020. She said she takes medications as prescribed.  She said she drives her car to her appointments and to  run errands.  She said she is having some difficulty sleeping related to stress issues related to medical needs of family members.  Follow Up Plan: Yvonne [name] to call client in next 3 weeks to discuss client use of relaxation techniques to manage anxiety and stress symptoms experienced  Yvonne [name].Yvonne [name] MSW, Yvonne [name] Licensed Clinical Social Worker [facility] Care [facility] [phone]

## 2019-05-11 ENCOUNTER — Ambulatory Visit (INDEPENDENT_AMBULATORY_CARE_PROVIDER_SITE_OTHER): Payer: Medicare HMO | Admitting: Licensed Clinical Social Worker

## 2019-05-11 DIAGNOSIS — I10 Essential (primary) hypertension: Secondary | ICD-10-CM | POA: Diagnosis not present

## 2019-05-11 DIAGNOSIS — J441 Chronic obstructive pulmonary disease with (acute) exacerbation: Secondary | ICD-10-CM | POA: Diagnosis not present

## 2019-05-11 DIAGNOSIS — F411 Generalized anxiety disorder: Secondary | ICD-10-CM

## 2019-05-11 DIAGNOSIS — E119 Type 2 diabetes mellitus without complications: Secondary | ICD-10-CM

## 2019-05-11 DIAGNOSIS — E785 Hyperlipidemia, unspecified: Secondary | ICD-10-CM | POA: Diagnosis not present

## 2019-05-11 DIAGNOSIS — G43709 Chronic migraine without aura, not intractable, without status migrainosus: Secondary | ICD-10-CM

## 2019-05-11 NOTE — Patient Instructions (Addendum)
Licensed Clinical Social Worker Visit Information   Materials Provided: No    Goals Addressed                                       . Client stated: "I want to get help in managing stress and anxiety" (pt-stated)              Current Barriers:  Mental Health Concerns   Financial challenges  Spouse of client is ill  Clinical Social Work Clinical Goal(s):  Over the next 30 days, Mikenna will talk with [name] regarding management of anxiety and stress symptoms of client  Interventions:  Encouraged client to use relaxation techniques of choice to manage anxiety and stress symptoms  Encouraged client to talk regularly with [name] to discuss anxiety and stress symptoms experienced by client  Encouraged client to talk with [name] as needed to discuss nursing needs of client  Encouraged client to talk with her sister as a means of emotional support  Patient Self Care Activities:   Attends all scheduled provider appointments   Client speaks daily with her sister for emotional support   Client enjoys reading as a way to manage stress   Plan:  Client to call [name] as needed to discuss anxiety and stress issues of client [name] to call client in next 3 weeks to discuss relaxation techniques use of client (reading, gardening in flowers) Client to communicate with [name] to discuss client's nursing needs. Client to participate in self care activities of choice Client to communicate with her sister as a means of emotional support. Client to attend scheduled medical appointments  Initial goal documentation                  [name] and client have spokenpreviouslyabout medical needs of spouse of client.[name] and client have spoken about client management of stress and anxiety symptoms.Client has said that she had numerous family members living at her home and that this home situation at present is stressful at times. [name] has talked with client  about use of relaxation techniques to help her manage stress and anxiety faced. Client said she likes to read, likes being outdoors, likes planting flowers and gardening and likes spending time with her grandchildren. [name] has talked with client about self care activities for client.[name] has encouraged client to take time to care for herself daily.[name] has encouraged client to talk with [name] as needed to discuss nursing needs of client. Client has phone number of [name] [phone]) for client to call [name] as needed for psychosocial support . Client spoke of health needs of her son. She said she is stressed related to health needs of her son.and that her son is scheduled to have surgery on his leg.on May 06, 2019. She said she has her prescribed medications. She said she has appointment with Dr. [name] on June 08, 2019. She said she takes medications as prescribed. She said she drives her car to her appointments and to run errands. She said she is having some difficulty sleeping related to stress issues related to medical needs of family members. Client spoke of recent surgery experienced by her son. She said her daughter also had surgery.  She said she has a family trip scheduled to the beach and is looking forward to this short vacation with family members. .    Follow Up Plan:[name] to call client in  next 3 weeks to discuss client use of relaxation techniques to manage anxiety and stress symptoms experienced   Patient verbalized understanding of patient instructions Patient declined a print copy of patient instructions.  [name].Keenan Trefry MSW, [name] Licensed Clinical Social Worker [facility] Care [facility] [phone]            Follow Up Plan:   [name].Tanekia Ryans MSW, [name] Licensed Clinical Social Worker [facility] [phone]

## 2019-05-11 NOTE — Chronic Care Management (AMB) (Signed)
Chronic Care Management    Clinical Social Work CCM Outreach Note  05/11/2019 Name: Yvonne [name] MRN: [id number] DOB: December 26, 1950  Yvonne [name] is a 68 y.o. year old female who is a primary care patient of [name], [name], MD . The CCM team was consulted for assistance with assessment of psychosocial needs.   [name] reached out to [name] today by phone .   Review of patient status, including review of consultants reports, relevant laboratory and other test results, and collaboration with appropriate care team members and the patient's provider was performed as part of comprehensive patient evaluation and provision of chronic care management services.   Social Determinants of Health: Risk for Depression; Risk for Stress    Office Visit from 04/06/2019 in [facility]  PHQ-9 Total Score  12     GAD 7 : Generalized Anxiety Score 12/05/2018 01/22/2018 04/30/2016 11/04/2015  Nervous, Anxious, on Edge 3 2 1  0  Control/stop worrying 3 3 3 1   Worry too much - different things 3 1 3 3   Trouble relaxing 3 1 3 3   Restless 2 1 2 2   Easily annoyed or irritable 2 1 2 2   Afraid - awful might happen 3 1 0 1  Total GAD 7 Score 19 10 14 12   Anxiety Difficulty Extremely difficult - Somewhat difficult -               Goals Addressed                           . Client stated: "I want to get help in managing stress and anxiety" (pt-stated)        Current Barriers:   Mental Health Concerns    Financial challenges  Spouse of client is ill  Clinical Social Work Clinical Goal(s):   Over the next 30 days, Nancye will talk with [name] regarding management of anxiety and stress symptoms of client  Interventions:  Encouraged client to use relaxation techniques of choice to manage anxiety and stress symptoms  Encouraged client to talk regularly with [name] to discuss anxiety and stress symptoms experienced by client  Encouraged client to talk with [name]  as needed to discuss nursing needs of client  Encouraged client to talk with her sister as a means of emotional support  Patient Self Care Activities:   Attends all scheduled provider appointments   Client speaks daily with her sister for emotional support    Client enjoys reading as a way to manage stress   Plan:  Client to call [name] as needed to discuss anxiety and stress issues of client [name] to call client in next 3 weeks to discuss relaxation techniques use of client (reading, gardening in flowers) Client to communicate with [name] to discuss client's nursing needs. Client to participate in self care activities of choice Client to communicate with her sister as a means of emotional support. Client to attend scheduled medical appointments  Initial goal documentation      [name] and client have spoken previously about medical needs of spouse of client.[name] and client have spoken about client management of stress and anxiety symptoms.Client has said that she had numerous family members living at her home and that this home situation at present is stressful at times. [name] has talked with client about use of relaxation techniques to help her manage stress and anxiety faced. Client said she likes to read, likes being outdoors,  likes planting flowers and gardening and likes spending time with her grandchildren. [name] has talked with client about self care activities for client. [name] has encouraged client to take time to care for herself daily. [name] has encouraged client to talk with [name] as needed to discuss nursing needs of client. Client has phone number of [name] [phone]) for client to call [name] as needed for psychosocial support . Client spoke of health needs of her son. She said she is stressed related to health needs of her son.and that her son is scheduled to have surgery on his leg.on May 06, 2019. She said she has her prescribed medications.  She said she has appointment with  Dr. [name] on June 08, 2019. She said she takes medications as prescribed.  She said she drives her car to her appointments and to run errands.  She said she is having some difficulty sleeping related to stress issues related to medical needs of family members. Client spoke of recent surgery experienced by her son. She said her daughter also had surgery.  She said she has a family trip scheduled to the beach and is looking forward to this short vacation with family members. .    Follow Up Plan: [name] to call client in next 3 weeks to discuss client use of relaxation techniques to manage anxiety and stress symptoms experienced  [name].Katharin Schneider MSW, [name] Licensed Clinical Social Worker [facility] [phone]

## 2019-05-19 ENCOUNTER — Other Ambulatory Visit: Payer: Self-pay | Admitting: Family Medicine

## 2019-05-20 ENCOUNTER — Other Ambulatory Visit (HOSPITAL_COMMUNITY): Payer: Self-pay | Admitting: Family Medicine

## 2019-05-20 DIAGNOSIS — Z1231 Encounter for screening mammogram for malignant neoplasm of breast: Secondary | ICD-10-CM

## 2019-05-22 ENCOUNTER — Inpatient Hospital Stay (HOSPITAL_COMMUNITY): Admission: RE | Admit: 2019-05-22 | Payer: Medicare HMO | Source: Ambulatory Visit

## 2019-05-22 DIAGNOSIS — G4733 Obstructive sleep apnea (adult) (pediatric): Secondary | ICD-10-CM | POA: Diagnosis not present

## 2019-05-25 ENCOUNTER — Telehealth: Payer: Medicare HMO

## 2019-06-02 ENCOUNTER — Ambulatory Visit: Payer: Self-pay | Admitting: Licensed Clinical Social Worker

## 2019-06-02 DIAGNOSIS — I10 Essential (primary) hypertension: Secondary | ICD-10-CM

## 2019-06-02 DIAGNOSIS — E119 Type 2 diabetes mellitus without complications: Secondary | ICD-10-CM

## 2019-06-02 DIAGNOSIS — G43111 Migraine with aura, intractable, with status migrainosus: Secondary | ICD-10-CM

## 2019-06-02 DIAGNOSIS — F411 Generalized anxiety disorder: Secondary | ICD-10-CM

## 2019-06-02 DIAGNOSIS — J441 Chronic obstructive pulmonary disease with (acute) exacerbation: Secondary | ICD-10-CM

## 2019-06-02 DIAGNOSIS — E785 Hyperlipidemia, unspecified: Secondary | ICD-10-CM

## 2019-06-02 DIAGNOSIS — G43709 Chronic migraine without aura, not intractable, without status migrainosus: Secondary | ICD-10-CM

## 2019-06-02 NOTE — Chronic Care Management (AMB) (Signed)
  Care Management Note   Yvonne [name] is a 68 y.o. year old female who is a primary care patient of [name], [name], MD. The CM team was consulted for assistance with chronic disease management and care coordination.   I reached out to [name] by phone today.   Review of patient status, including review of consultants reports, relevant laboratory and other test results, and collaboration with appropriate care team members and the patient's provider was performed as part of comprehensive patient evaluation and provision of chronic care management services.    Social Determinants of Health:risk for Depression; risk for Stress; risk for financial strain; risk for occasional social isolation    Office Visit from 04/06/2019 in [facility]  PHQ-9 Total Score  12     GAD 7 : Generalized Anxiety Score 12/05/2018 01/22/2018 04/30/2016 11/04/2015  Nervous, Anxious, on Edge 3 2 1  0  Control/stop worrying 3 3 3 1   Worry too much - different things 3 1 3 3   Trouble relaxing 3 1 3 3   Restless 2 1 2 2   Easily annoyed or irritable 2 1 2 2   Afraid - awful might happen 3 1 0 1  Total GAD 7 Score 19 10 14 12   Anxiety Difficulty Extremely difficult - Somewhat difficult -   Goals    . Client stated: "I want to get help in managing stress and anxiety" (pt-stated)     Current Barriers:  [name] Kitchen Mental Health Concerns   . Financial challenges . Spouse of client is ill  Clinical Social Work Clinical Goal(s):  [name] Kitchen Over the next 30 days, Kaili will talk with [name] regarding management of anxiety and stress symptoms of client  Interventions:  . Encouraged client to use relaxation techniques of choice to manage anxiety and stress symptoms . Provided counseling support for client  . Encouraged client to talk with [name] as needed to discuss nursing needs of client . Encouraged client to talk with her sister as a means of emotional support  Patient Self Care Activities:  . Attends all  scheduled provider appointments  . Client speaks daily with her sister for emotional support  .  Client enjoys reading as a way to manage stress   Plan: [name] to call client in next 3 weeks to discuss relaxation techniques use of client (reading, gardening in flowers) Client to communicate with [name] to discuss client's nursing needs. Client to participate in self care activities of choice Client to communicate with her sister as a means of emotional support. Client to attend scheduled medical appointments  Initial goal documentation   Client said she has appointment next Monday with Dr. [name]. Client said she enjoys visiting with family, especially with her sister. She is having some difficulty sleeping. She said she will visit her son this coming weekend.She said she is eating adequately. [name] encouraged Harvey to communicate with [name] as needed to discuss nursing needs of client. Client does enjoy working outdoors and gardening as a way to relax.  She enjoys socializing with her sister.  Follow Up Plan:  [name] to call client in next 3 weeks to discuss relaxation techniques of use with client  [name].Khris Jansson MSW, [name] Licensed Clinical Social Worker [facility] [phone]

## 2019-06-02 NOTE — Patient Instructions (Signed)
Licensed Clinical Social Worker Visit Information  Goals we discussed today:   Goals    . Client stated: "I want to get help in managing stress and anxiety" (pt-stated)     Current Barriers:  [name] Kitchen Mental Health Concerns   . Financial challenges . Spouse of client is ill  Clinical Social Work Clinical Goal(s):  [name] Kitchen Over the next 30 days, Cyndia will talk with [name] regarding management of anxiety and stress symptoms of client  Interventions: . Encouraged client to use relaxation techniques of choice to manage anxiety and stress symptoms . Provided counseling support for client . Encouraged client to talk with [name] as needed to discuss nursing needs of client . Encouraged client to talk with her sister as a means of emotional support  Patient Self Care Activities:  . Attends all scheduled provider appointments  . Client speaks daily with her sister for emotional support  .  Client enjoys reading as a way to manage stress   Plan: Client to call [name] as needed to discuss anxiety and stress issues of client [name] to call client in next 3 weeks to discuss relaxation techniques use of client (reading, gardening in flowers) Client to communicate with [name] to discuss client's nursing needs. Client to participate in self care activities of choice Client to communicate with her sister as a means of emotional support. Client to attend scheduled medical appointments  Initial goal documentation     Materials Provided: No  Follow Up Plan: [name] to call client in next 3 weeks to discuss relaxation techniques use of  client (reading, gardening in flowers)  The patient verbalized understanding of instructions provided today and declined a print copy of patient instruction materials.   [name].Reeya Bound MSW, [name] Licensed Clinical Social Worker [facility] [phone]

## 2019-06-04 ENCOUNTER — Other Ambulatory Visit: Payer: Self-pay

## 2019-06-04 ENCOUNTER — Ambulatory Visit (INDEPENDENT_AMBULATORY_CARE_PROVIDER_SITE_OTHER): Payer: Medicare HMO | Admitting: Family Medicine

## 2019-06-04 ENCOUNTER — Ambulatory Visit (HOSPITAL_COMMUNITY): Payer: Medicare HMO

## 2019-06-04 ENCOUNTER — Encounter: Payer: Self-pay | Admitting: Family Medicine

## 2019-06-04 DIAGNOSIS — G43709 Chronic migraine without aura, not intractable, without status migrainosus: Secondary | ICD-10-CM | POA: Diagnosis not present

## 2019-06-04 DIAGNOSIS — J0100 Acute maxillary sinusitis, unspecified: Secondary | ICD-10-CM | POA: Diagnosis not present

## 2019-06-04 MED ORDER — ONDANSETRON 8 MG PO TBDP: 8.0000 mg | ORAL_TABLET | Freq: Four times a day (QID) | ORAL | 1 refills | Status: DC | PRN

## 2019-06-04 MED ORDER — LEVOFLOXACIN 500 MG PO TABS: 500.0000 mg | ORAL_TABLET | Freq: Every day | ORAL | 0 refills | Status: DC

## 2019-06-04 NOTE — Progress Notes (Signed)
Subjective:    Patient ID: Yvonne [name], female    DOB: 1951-09-15, 68 y.o.   MRN: [id number]   HPI: Yvonne [name] is a 68 y.o. female presenting for onset of chills at 8:15 last night. Temp 101.7. Low grade today. Stomach hurts, nauseated.Feels bloated. Constipated.  HA - feels like it will pop. Ear pain. Left side. Has tubes . Feels blocked. Tried to eat chicken noodle soup and felt she would vomit. Held down some sprite.  Depression screen [facility] 2/9 04/06/2019 01/28/2019 01/20/2019 12/05/2018 12/02/2018  Decreased Interest 2 2 2 2 2   Down, Depressed, Hopeless 1 1 2 2 3   PHQ - 2 Score 3 3 4 4 5   Altered sleeping 2 2 2 2 2   Tired, decreased energy 2 2 1 2 2   Change in appetite 3 3 2 3 3   Feeling bad or failure about yourself  1 1 1 2 3   Trouble concentrating 1 1 3 2 2   Moving slowly or fidgety/restless 0 2 2 1 2   Suicidal thoughts 0 0 0 0 0  PHQ-9 Score 12 14 15 16 19   Some recent data might be hidden     Relevant past medical, surgical, family and social history reviewed and updated as indicated.  Interim medical history since our last visit reviewed. Allergies and medications reviewed and updated.  ROS:  Review of Systems  Constitutional: Positive for fever. Negative for appetite change, chills, diaphoresis and fatigue.  HENT: Positive for ear pain. Negative for congestion, hearing loss, postnasal drip, rhinorrhea, sore throat and trouble swallowing.   Respiratory: Negative for cough, chest tightness and shortness of breath.   Cardiovascular: Negative for chest pain and palpitations.  Gastrointestinal: Negative for abdominal pain.  Musculoskeletal: Negative for arthralgias.  Skin: Negative for rash.  Neurological: Positive for headaches.     Social History   Tobacco Use  Smoking Status Never Smoker  Smokeless Tobacco Never Used       Objective:     Wt Readings from Last 3 Encounters:  04/06/19 182 lb 9.6 oz (82.8 kg)  01/28/19 180 lb 4 oz (81.8 kg)  01/20/19 179 lb  (81.2 kg)     Exam deferred. Pt. Harboring due to COVID 19. Phone visit performed.   Assessment & Plan:   1. Chronic migraine without aura without status migrainosus, not intractable   2. Acute maxillary sinusitis, recurrence not specified     Meds ordered this encounter  Medications  . levofloxacin (LEVAQUIN) 500 MG tablet    Sig: Take 1 tablet (500 mg total) by mouth daily.    Dispense:  7 tablet    Refill:  0  . ondansetron (ZOFRAN-ODT) 8 MG disintegrating tablet    Sig: Take 1 tablet (8 mg total) by mouth every 6 (six) hours as needed for nausea or vomiting.    Dispense:  20 tablet    Refill:  1    No orders of the defined types were placed in this encounter.     Diagnoses and all orders for this visit:  Chronic migraine without aura without status migrainosus, not intractable  Acute maxillary sinusitis, recurrence not specified  Other orders -     levofloxacin (LEVAQUIN) 500 MG tablet; Take 1 tablet (500 mg total) by mouth daily. -     ondansetron (ZOFRAN-ODT) 8 MG disintegrating tablet; Take 1 tablet (8 mg total) by mouth every 6 (six) hours as needed for nausea or vomiting.    Virtual Visit via  telephone Note  I discussed the limitations, risks, security and privacy concerns of performing an evaluation and management service by telephone and the availability of in person appointments. The patient was identified with two identifiers. Pt.expressed understanding and agreed to proceed. Pt. Is at home. Dr. [name] is in his office.  Follow Up Instructions:   I discussed the assessment and treatment plan with the patient. The patient was provided an opportunity to ask questions and all were answered. The patient agreed with the plan and demonstrated an understanding of the instructions.   The patient was advised to call back or seek an in-person evaluation if the symptoms worsen or if the condition fails to improve as anticipated.   Total minutes including chart  review and phone contact time: 15   Follow up plan: Return if symptoms worsen or fail to improve.  [name], MD [place]

## 2019-06-08 ENCOUNTER — Ambulatory Visit (INDEPENDENT_AMBULATORY_CARE_PROVIDER_SITE_OTHER): Payer: Medicare HMO | Admitting: Family Medicine

## 2019-06-08 ENCOUNTER — Encounter: Payer: Self-pay | Admitting: Family Medicine

## 2019-06-08 DIAGNOSIS — E785 Hyperlipidemia, unspecified: Secondary | ICD-10-CM | POA: Diagnosis not present

## 2019-06-08 DIAGNOSIS — I69359 Hemiplegia and hemiparesis following cerebral infarction affecting unspecified side: Secondary | ICD-10-CM | POA: Diagnosis not present

## 2019-06-08 DIAGNOSIS — E119 Type 2 diabetes mellitus without complications: Secondary | ICD-10-CM | POA: Diagnosis not present

## 2019-06-08 DIAGNOSIS — F411 Generalized anxiety disorder: Secondary | ICD-10-CM | POA: Diagnosis not present

## 2019-06-08 DIAGNOSIS — I10 Essential (primary) hypertension: Secondary | ICD-10-CM

## 2019-06-08 MED ORDER — ALPRAZOLAM 1 MG PO TABS: 1.0000 mg | ORAL_TABLET | Freq: Three times a day (TID) | ORAL | 5 refills | Status: DC

## 2019-06-08 NOTE — Progress Notes (Signed)
Subjective:  Patient ID: Yvonne [name],  female    DOB: 09-17-1951  Age: 68 y.o.    CC: No chief complaint on file.   HPI ERIK NESSEL presents for  follow-up of hypertension. Patient has no history of headache chest pain or shortness of breath or recent cough. Patient also denies symptoms of TIA such as numbness weakness lateralizing. Patient denies side effects from medication. States taking it regularly.  Patient also  in for follow-up of elevated cholesterol. Doing well without complaints on current medication. Denies side effects  including myalgia and arthralgia and nausea. Also in today for liver function testing. Currently no chest pain, shortness of breath or other cardiovascular related symptoms noted.  Follow-up of diabetes. Patient does check blood sugar at home. Readings run between 90 and 120 Patient denies symptoms such as excessive hunger or urinary frequency, excessive hunger, nausea No significant hypoglycemic spells noted. Medications reviewed. Pt reports taking them regularly. Pt. denies complication/adverse reaction today.    History Tysheka has a past medical history of Anxiety, Arthritis, Asthma, Chronic cough, Chronic otitis media (12/2017), Full dentures, GERD (gastroesophageal reflux disease), History of stroke (09/2017), Hyperlipidemia, Hypertension, Non-insulin dependent type 2 diabetes mellitus ([place]), Overactive bladder, Recurrent acute suppurative otitis media without spontaneous rupture of left tympanic membrane (02/19/2018), and Transient cerebral ischemia.   She has a past surgical history that includes Cholecystectomy; Colonoscopy (N/A, 08/17/2015); Abdominal hysterectomy; Lacrimal duct exploration (Bilateral); Cataract extraction w/ intraocular lens implant (Right); Vitrectomy (Left, 09/14/2015); Gas insertion (Right); and Myringotomy with tube placement (Bilateral, 01/28/2018).   Her family history includes Arthritis in her brother and mother; Arthritis-Osteo  in her son; Asthma in her father; CVA in her mother; Cancer in her brother; Diabetes in her father and mother; Heart disease in her father; Hepatitis C in her sister; Peripheral Artery Disease in her daughter; Stroke in her mother.She reports that she has never smoked. She has never used smokeless tobacco. She reports that she does not drink alcohol or use drugs.  Current Outpatient Medications on File Prior to Visit  Medication Sig Dispense Refill  . albuterol (VENTOLIN HFA) 108 (90 Base) MCG/ACT inhaler Inhale 2 puffs into the lungs every 6 (six) hours as needed for wheezing or shortness of breath. 18 Inhaler 5  . amLODipine (NORVASC) 10 MG tablet TAKE 1 TABLET (5 MG TOTAL) BY MOUTH DAILY. (Patient taking differently: Take 10 mg by mouth every morning. ) 90 tablet 1  . aspirin EC 325 MG tablet Take 1 tablet (325 mg total) by mouth daily. 100 tablet 3  . atorvastatin (LIPITOR) 40 MG tablet TAKE 1 TABLET EVERY DAY AT 6PM (Patient taking differently: Take 40 mg by mouth every evening. ) 90 tablet 1  . BELBUCA 150 MCG FILM Place 150 mcg under the tongue 2 (two) times daily.     . diclofenac (VOLTAREN) 75 MG EC tablet Take 1 tablet (75 mg total) by mouth 2 (two) times daily. 30 tablet 0  . ezetimibe (ZETIA) 10 MG tablet Take 1 tablet (10 mg total) by mouth daily. For cholesterol (Patient taking differently: Take 10 mg by mouth every evening. For cholesterol) 90 tablet 1  . fluticasone furoate-vilanterol (BREO ELLIPTA) 100-25 MCG/INH AEPB Inhale 1 puff into the lungs daily. 180 each 5  . furosemide (LASIX) 20 MG tablet Take 1 tablet (20 mg total) by mouth daily. 90 tablet 1  . glucose blood (ACCU-CHEK AVIVA PLUS) test strip Test up to 4 times daily 100 each 11  .  levofloxacin (LEVAQUIN) 500 MG tablet Take 1 tablet (500 mg total) by mouth daily. 7 tablet 0  . loratadine (CLARITIN) 10 MG tablet Take 1 tablet (10 mg total) by mouth daily. 90 tablet 3  . metFORMIN (GLUCOPHAGE-XR) 750 MG 24 hr tablet Take 1  tablet (750 mg total) by mouth 2 (two) times daily. 60 tablet 5  . ondansetron (ZOFRAN-ODT) 8 MG disintegrating tablet Take 1 tablet (8 mg total) by mouth every 6 (six) hours as needed for nausea or vomiting. 20 tablet 1  . pantoprazole (PROTONIX) 40 MG tablet Take 1 tablet (40 mg total) by mouth 2 (two) times daily. For stomach 60 tablet 5  . topiramate (TOPAMAX) 200 MG tablet TAKE 1 TABLET (200 MG TOTAL) BY MOUTH AT BEDTIME. 90 tablet 0   No current facility-administered medications on file prior to visit.     ROS Review of Systems  Constitutional: Negative.   HENT: Negative for congestion.   Eyes: Negative for visual disturbance.  Respiratory: Negative for shortness of breath.   Cardiovascular: Negative for chest pain.  Gastrointestinal: Negative for abdominal pain, constipation, diarrhea, nausea and vomiting.  Genitourinary: Negative for difficulty urinating.  Musculoskeletal: Negative for arthralgias and myalgias.  Neurological: Negative for headaches.  Psychiatric/Behavioral: Negative for sleep disturbance.    Objective:  There were no vitals taken for this visit.  BP Readings from Last 3 Encounters:  04/06/19 (!) 171/92  01/28/19 (!) 162/85  01/20/19 (!) 162/85    Wt Readings from Last 3 Encounters:  04/06/19 182 lb 9.6 oz (82.8 kg)  01/28/19 180 lb 4 oz (81.8 kg)  01/20/19 179 lb (81.2 kg)     Physical Exam  Exam deferred. Pt. Harboring due to COVID 19. Phone visit performed.   Assessment & Plan:   Diagnoses and all orders for this visit:  Type 2 diabetes mellitus without complication, without long-term current use of insulin (HCC)  Hemiparesis affecting dominant side as late effect of cerebrovascular accident (CVA) ([place])  Hyperlipidemia with target LDL less than 70  Essential hypertension  GAD (generalized anxiety disorder)  Other orders -     ALPRAZolam (XANAX) 1 MG tablet; Take 1 tablet (1 mg total) by mouth 3 (three) times daily.   I have  changed [name]. Capozzi's ALPRAZolam. I am also having her maintain her albuterol, atorvastatin, ezetimibe, fluticasone furoate-vilanterol, furosemide, pantoprazole, amLODipine, metFORMIN, [place], aspirin EC, loratadine, glucose blood, diclofenac, topiramate, levofloxacin, and ondansetron.  Meds ordered this encounter  Medications  . ALPRAZolam (XANAX) 1 MG tablet    Sig: Take 1 tablet (1 mg total) by mouth 3 (three) times daily.    Dispense:  90 tablet    Refill:  5   Virtual Visit via telephone Note  I discussed the limitations, risks, security and privacy concerns of performing an evaluation and management service by telephone and the availability of in person appointments. I also discussed with the patient that there may be a patient responsible charge related to this service. The patient expressed understanding and agreed to proceed. Pt. Is at home. Dr. [name] is in his office.  Follow Up Instructions:   I discussed the assessment and treatment plan with the patient. The patient was provided an opportunity to ask questions and all were answered. The patient agreed with the plan and demonstrated an understanding of the instructions.   The patient was advised to call back or seek an in-person evaluation if the symptoms worsen or if the condition fails to improve as anticipated.  Total minutes including chart review and phone contact time: 25   Follow-up: Return in about 3 months (around 09/08/2019).  [name], M.D.

## 2019-06-09 ENCOUNTER — Telehealth: Payer: Self-pay | Admitting: Family Medicine

## 2019-06-09 ENCOUNTER — Other Ambulatory Visit: Payer: Self-pay | Admitting: Family Medicine

## 2019-06-09 MED ORDER — SITAGLIP PHOS-METFORMIN HCL ER 50-1000 MG PO TB24: 1.0000 | ORAL_TABLET | Freq: Two times a day (BID) | ORAL | 1 refills | Status: DC

## 2019-06-09 NOTE — Telephone Encounter (Signed)
I sent in the requested prescription 

## 2019-06-09 NOTE — Telephone Encounter (Signed)
Had OV yesterday - can we replace metformin with something else?

## 2019-06-09 NOTE — Telephone Encounter (Signed)
Left message stating that metformin has now been sent to the pharmacy

## 2019-06-10 ENCOUNTER — Other Ambulatory Visit: Payer: Self-pay

## 2019-06-10 ENCOUNTER — Encounter (HOSPITAL_COMMUNITY): Payer: Self-pay | Admitting: *Deleted

## 2019-06-10 ENCOUNTER — Emergency Department (HOSPITAL_COMMUNITY): Payer: Medicare HMO

## 2019-06-10 ENCOUNTER — Observation Stay (HOSPITAL_COMMUNITY)
Admission: EM | Admit: 2019-06-10 | Discharge: 2019-06-12 | Disposition: A | Discharge status: Home / Self Care | Payer: Medicare HMO | Attending: Internal Medicine | Admitting: Internal Medicine

## 2019-06-10 DIAGNOSIS — G8190 Hemiplegia, unspecified affecting unspecified side: Principal | ICD-10-CM | POA: Insufficient documentation

## 2019-06-10 DIAGNOSIS — I10 Essential (primary) hypertension: Secondary | ICD-10-CM | POA: Diagnosis present

## 2019-06-10 DIAGNOSIS — F419 Anxiety disorder, unspecified: Secondary | ICD-10-CM | POA: Diagnosis not present

## 2019-06-10 DIAGNOSIS — G459 Transient cerebral ischemic attack, unspecified: Secondary | ICD-10-CM | POA: Diagnosis not present

## 2019-06-10 DIAGNOSIS — R799 Abnormal finding of blood chemistry, unspecified: Secondary | ICD-10-CM | POA: Diagnosis not present

## 2019-06-10 DIAGNOSIS — Z7982 Long term (current) use of aspirin: Secondary | ICD-10-CM | POA: Insufficient documentation

## 2019-06-10 DIAGNOSIS — Z20828 Contact with and (suspected) exposure to other viral communicable diseases: Secondary | ICD-10-CM | POA: Insufficient documentation

## 2019-06-10 DIAGNOSIS — J45909 Unspecified asthma, uncomplicated: Secondary | ICD-10-CM | POA: Insufficient documentation

## 2019-06-10 DIAGNOSIS — I6601 Occlusion and stenosis of right middle cerebral artery: Secondary | ICD-10-CM | POA: Diagnosis not present

## 2019-06-10 DIAGNOSIS — E785 Hyperlipidemia, unspecified: Secondary | ICD-10-CM | POA: Diagnosis not present

## 2019-06-10 DIAGNOSIS — E1165 Type 2 diabetes mellitus with hyperglycemia: Secondary | ICD-10-CM | POA: Insufficient documentation

## 2019-06-10 DIAGNOSIS — R531 Weakness: Secondary | ICD-10-CM

## 2019-06-10 DIAGNOSIS — E119 Type 2 diabetes mellitus without complications: Secondary | ICD-10-CM

## 2019-06-10 DIAGNOSIS — F411 Generalized anxiety disorder: Secondary | ICD-10-CM | POA: Diagnosis present

## 2019-06-10 DIAGNOSIS — R0902 Hypoxemia: Secondary | ICD-10-CM | POA: Diagnosis not present

## 2019-06-10 DIAGNOSIS — R202 Paresthesia of skin: Secondary | ICD-10-CM | POA: Diagnosis not present

## 2019-06-10 DIAGNOSIS — Z209 Contact with and (suspected) exposure to unspecified communicable disease: Secondary | ICD-10-CM | POA: Diagnosis not present

## 2019-06-10 DIAGNOSIS — G43709 Chronic migraine without aura, not intractable, without status migrainosus: Secondary | ICD-10-CM | POA: Diagnosis present

## 2019-06-10 LAB — CBC WITH DIFFERENTIAL/PLATELET
Abs Immature Granulocytes: 0.02 10*3/uL (ref 0.00–0.07)
Basophils Absolute: 0 10*3/uL (ref 0.0–0.1)
Basophils Relative: 0 %
Eosinophils Absolute: 0.1 10*3/uL (ref 0.0–0.5)
Eosinophils Relative: 1 %
HCT: 39.2 % (ref 36.0–46.0)
Hemoglobin: 12.9 g/dL (ref 12.0–15.0)
Immature Granulocytes: 0 %
Lymphocytes Relative: 45 %
Lymphs Abs: 2.8 10*3/uL (ref 0.7–4.0)
MCH: 28.5 pg (ref 26.0–34.0)
MCHC: 32.9 g/dL (ref 30.0–36.0)
MCV: 86.5 fL (ref 80.0–100.0)
Monocytes Absolute: 0.4 10*3/uL (ref 0.1–1.0)
Monocytes Relative: 7 %
Neutro Abs: 3 10*3/uL (ref 1.7–7.7)
Neutrophils Relative %: 47 %
Platelets: 262 10*3/uL (ref 150–400)
RBC: 4.53 MIL/uL (ref 3.87–5.11)
RDW: 12.2 % (ref 11.5–15.5)
WBC: 6.3 10*3/uL (ref 4.0–10.5)
nRBC: 0 % (ref 0.0–0.2)

## 2019-06-10 LAB — COMPREHENSIVE METABOLIC PANEL
ALT: 17 U/L (ref 0–44)
AST: 16 U/L (ref 15–41)
Albumin: 3.4 g/dL — ABNORMAL LOW (ref 3.5–5.0)
Alkaline Phosphatase: 85 U/L (ref 38–126)
Anion gap: 8 (ref 5–15)
BUN: 15 mg/dL (ref 8–23)
CO2: 30 mmol/L (ref 22–32)
Calcium: 9.1 mg/dL (ref 8.9–10.3)
Chloride: 103 mmol/L (ref 98–111)
Creatinine, Ser: 0.73 mg/dL (ref 0.44–1.00)
GFR calc Af Amer: >60 mL/min (ref >60)
GFR calc non Af Amer: >60 mL/min (ref >60)
Glucose, Bld: 148 mg/dL — ABNORMAL HIGH (ref 70–99)
Potassium: 3.8 mmol/L (ref 3.5–5.1)
Sodium: 141 mmol/L (ref 135–145)
Total Bilirubin: 0.4 mg/dL (ref 0.3–1.2)
Total Protein: 6.8 g/dL (ref 6.5–8.1)

## 2019-06-10 LAB — GLUCOSE, CAPILLARY: Glucose-Capillary: 156 mg/dL — ABNORMAL HIGH (ref 70–99)

## 2019-06-10 LAB — SARS CORONAVIRUS 2 BY RT PCR (HOSPITAL ORDER, PERFORMED IN ~~LOC~~ HOSPITAL LAB): SARS Coronavirus 2: NEGATIVE

## 2019-06-10 MED ORDER — ALBUTEROL SULFATE (2.5 MG/3ML) 0.083% IN NEBU: 3.0000 mL | INHALATION_SOLUTION | Freq: Four times a day (QID) | RESPIRATORY_TRACT | Status: DC | PRN

## 2019-06-10 MED ORDER — ACETAMINOPHEN 325 MG PO TABS: 650.0000 mg | ORAL_TABLET | ORAL | Status: DC | PRN

## 2019-06-10 MED ORDER — INSULIN ASPART 100 UNIT/ML ~~LOC~~ SOLN: 0.0000 [IU] | Freq: Every day | SUBCUTANEOUS | Status: DC

## 2019-06-10 MED ORDER — ALPRAZOLAM 1 MG PO TABS
1.0000 mg | ORAL_TABLET | Freq: Three times a day (TID) | ORAL | Status: DC
  Administered 2019-06-10 – 2019-06-12 (×5): 1 mg via ORAL
  Filled 2019-06-10 (×5): qty 1

## 2019-06-10 MED ORDER — ENOXAPARIN SODIUM 40 MG/0.4ML ~~LOC~~ SOLN: 40.0000 mg | SUBCUTANEOUS | Status: DC

## 2019-06-10 MED ORDER — SODIUM CHLORIDE 0.9 % IV SOLN
INTRAVENOUS | Status: DC
  Administered 2019-06-10 – 2019-06-11 (×2): via INTRAVENOUS

## 2019-06-10 MED ORDER — LABETALOL HCL 5 MG/ML IV SOLN: 10.0000 mg | INTRAVENOUS | Status: DC | PRN

## 2019-06-10 MED ORDER — STROKE: EARLY STAGES OF RECOVERY BOOK
Freq: Once | Status: AC
Start: 2019-06-10 — End: 2019-06-10
  Administered 2019-06-10: 20:00:00

## 2019-06-10 MED ORDER — FUROSEMIDE 20 MG PO TABS: 20.0000 mg | ORAL_TABLET | Freq: Every day | ORAL | Status: DC | PRN

## 2019-06-10 MED ORDER — FLUTICASONE FUROATE-VILANTEROL 100-25 MCG/INH IN AEPB
1.0000 | INHALATION_SPRAY | Freq: Every day | RESPIRATORY_TRACT | Status: DC | PRN
  Filled 2019-06-10: qty 28

## 2019-06-10 MED ORDER — SENNOSIDES-DOCUSATE SODIUM 8.6-50 MG PO TABS: 1.0000 | ORAL_TABLET | Freq: Every evening | ORAL | Status: DC | PRN

## 2019-06-10 MED ORDER — BUPRENORPHINE HCL 150 MCG BU FILM: 150.0000 ug | ORAL_FILM | Freq: Two times a day (BID) | BUCCAL | Status: DC

## 2019-06-10 MED ORDER — ASPIRIN EC 325 MG PO TBEC
325.0000 mg | DELAYED_RELEASE_TABLET | Freq: Every day | ORAL | Status: DC
  Administered 2019-06-11 – 2019-06-12 (×2): 325 mg via ORAL
  Filled 2019-06-10 (×2): qty 1

## 2019-06-10 MED ORDER — ACETAMINOPHEN 650 MG RE SUPP: 650.0000 mg | RECTAL | Status: DC | PRN

## 2019-06-10 MED ORDER — ACETAMINOPHEN 160 MG/5ML PO SOLN: 650.0000 mg | ORAL | Status: DC | PRN

## 2019-06-10 MED ORDER — ATORVASTATIN CALCIUM 40 MG PO TABS
40.0000 mg | ORAL_TABLET | Freq: Every evening | ORAL | Status: DC
  Administered 2019-06-10 – 2019-06-11 (×2): 40 mg via ORAL
  Filled 2019-06-10 (×2): qty 1

## 2019-06-10 MED ORDER — LORAZEPAM 2 MG/ML IJ SOLN
1.0000 mg | Freq: Once | INTRAMUSCULAR | Status: AC
  Administered 2019-06-10: 1 mg via INTRAVENOUS
  Filled 2019-06-10: qty 1

## 2019-06-10 MED ORDER — EZETIMIBE 10 MG PO TABS
10.0000 mg | ORAL_TABLET | Freq: Every evening | ORAL | Status: DC
  Administered 2019-06-10 – 2019-06-11 (×2): 10 mg via ORAL
  Filled 2019-06-10 (×2): qty 1

## 2019-06-10 MED ORDER — INSULIN ASPART 100 UNIT/ML ~~LOC~~ SOLN: 0.0000 [IU] | Freq: Three times a day (TID) | SUBCUTANEOUS | Status: DC

## 2019-06-10 MED ORDER — LORATADINE 10 MG PO TABS
10.0000 mg | ORAL_TABLET | Freq: Every day | ORAL | Status: DC
  Administered 2019-06-11 – 2019-06-12 (×2): 10 mg via ORAL
  Filled 2019-06-10 (×2): qty 1

## 2019-06-10 NOTE — ED Notes (Signed)
Sister call to get an update on patient, Pt sleeping, easily awaken to speak with her sister. Denies, weakness, pain, clear speech, patient is sleepy from Ativan.

## 2019-06-10 NOTE — H&P (Signed)
History and Physical    SHAKEIRA RHEE CBJ:[id number] DOB: 06-Mar-1951 DOA: 06/10/2019  PCP: [name], MD  Patient coming from: Home  I have personally briefly reviewed patient's old medical records in [place]  Chief Complaint: Left-sided weakness  HPI: JARROD BODKINS is a 68 y.o. female with medical history significant of stroke, generalized anxiety disorder, asthma, GERD, hypertension, hyperlipidemia, type 2 diabetes mellitus came to the emergency department with a complaint of left-sided weakness.  According to patient, she started having left-sided weakness yesterday around morning time.  This has been intermittent since then.  She does not have any other complaints such as headache, dizziness, problem with speaking, swallowing, bladder or bowel incontinence, seizure-like activity, chest pain, shortness of breath, fever, chills, sweating or any other complaint.   ED Course: Upon arrival to the emergency department, patient did not have any more weakness on the left side or any other weakness whatsoever.  She was hemodynamically stable with blood pressure very close to being normal.  Due to suspicion of a stroke, CT of the head was done which was unremarkable.  Patient was seen by telemetry neurology and they recommended admission for observation and further stroke work-up with MRI of the head and MRI of the brain which are also done and there is no evidence of new stroke however it seems like she has had new stroke since her previous study but that is likely subacute.  Review of Systems: As per HPI otherwise 10 point review of systems negative.    Past Medical History:  Diagnosis Date   Anxiety    Arthritis    left hand   Asthma    daily and prn inhalers   Chronic cough    Chronic otitis media 12/2017   Full dentures    GERD (gastroesophageal reflux disease)    History of stroke 09/2017   weakness right hand, numbness right side face   Hyperlipidemia     Hypertension    states under control with meds., has been on med. x a long time, per pt.   Non-insulin dependent type 2 diabetes mellitus (HCC)    Overactive bladder    Recurrent acute suppurative otitis media without spontaneous rupture of left tympanic membrane 02/19/2018   Transient cerebral ischemia     Past Surgical History:  Procedure Laterality Date   ABDOMINAL HYSTERECTOMY     complete   CATARACT EXTRACTION W/ INTRAOCULAR LENS IMPLANT Right    CHOLECYSTECTOMY     COLONOSCOPY N/A 08/17/2015   Procedure: COLONOSCOPY;  Surgeon: Rogene Houston, MD;  Location: [facility];  Service: Endoscopy;  Laterality: N/A;  200 - moved to 7:30 - Ann notified pt   GAS INSERTION Right    x 2 - eye   LACRIMAL DUCT EXPLORATION Bilateral    removal of tear ducts   MYRINGOTOMY WITH TUBE PLACEMENT Bilateral 01/28/2018   Procedure: BILATERAL MYRINGOTOMY WITH TUBE PLACEMENT;  Surgeon: Leta Baptist, MD;  Location: [facility];  Service: ENT;  Laterality: Bilateral;   VITRECTOMY Left 09/14/2015     reports that she has never smoked. She has never used smokeless tobacco. She reports that she does not drink alcohol or use drugs.  Allergies  Allergen Reactions   Penicillins Hives    Has patient had a PCN reaction causing immediate rash, facial/tongue/throat swelling, SOB or lightheadedness with hypotension: Yes Has patient had a PCN reaction causing severe rash involving mucus membranes or skin necrosis: Unk Has patient had  a PCN reaction that required hospitalization: No Has patient had a PCN reaction occurring within the last 10 years: No If all of the above answers are "NO", then may proceed with Cephalosporin use.    Gabapentin Other (See Comments)    Causes a lot of lethargy at a higher dose   Lisinopril Cough   Soap Rash    DIAL soap    Family History  Problem Relation Age of Onset   Arthritis Mother    Diabetes Mother    CVA Mother    Stroke Mother     Diabetes Father    Heart disease Father        CABG.  Does not know age of onset   Asthma Father    Hepatitis C Sister    Arthritis Brother    Cancer Brother        metastic cancer   Peripheral Artery Disease Daughter    Arthritis-Osteo Son     Prior to Admission medications   Medication Sig Start Date End Date Taking? Authorizing Provider  albuterol (VENTOLIN HFA) 108 (90 Base) MCG/ACT inhaler Inhale 2 puffs into the lungs every 6 (six) hours as needed for wheezing or shortness of breath. 10/22/18  Yes [name], [name], MD  ALPRAZolam Duanne Moron) 1 MG tablet Take 1 tablet (1 mg total) by mouth 3 (three) times daily. 06/08/19  Yes [name], [name], MD  amLODipine (NORVASC) 10 MG tablet TAKE 1 TABLET (5 MG TOTAL) BY MOUTH DAILY. Patient taking differently: Take 10 mg by mouth every morning.  10/22/18  Yes [name], MD  aspirin EC 325 MG tablet Take 1 tablet (325 mg total) by mouth daily. 12/12/18 12/12/19 Yes Kathie Dike, MD  atorvastatin (LIPITOR) 40 MG tablet TAKE 1 TABLET EVERY DAY AT 6PM Patient taking differently: Take 40 mg by mouth every evening.  10/22/18  Yes [name], [name], MD  ezetimibe (ZETIA) 10 MG tablet Take 1 tablet (10 mg total) by mouth daily. For cholesterol Patient taking differently: Take 10 mg by mouth every evening. For cholesterol 10/22/18  Yes [name], [name], MD  fluticasone furoate-vilanterol (BREO ELLIPTA) 100-25 MCG/INH AEPB Inhale 1 puff into the lungs daily. Patient taking differently: Inhale 1 puff into the lungs daily as needed (for shortness of breath).  10/22/18  Yes [name], [name], MD  furosemide (LASIX) 20 MG tablet Take 1 tablet (20 mg total) by mouth daily. Patient taking differently: Take 20 mg by mouth daily as needed for fluid.  10/22/18  Yes [name], MD  loratadine (CLARITIN) 10 MG tablet Take 1 tablet (10 mg total) by mouth daily. 02/18/19  Yes [name], MD  ondansetron (ZOFRAN-ODT) 8 MG disintegrating tablet Take 1 tablet (8 mg  total) by mouth every 6 (six) hours as needed for nausea or vomiting. 06/04/19  Yes [name], [name], MD  topiramate (TOPAMAX) 200 MG tablet TAKE 1 TABLET (200 MG TOTAL) BY MOUTH AT BEDTIME. 05/19/19  Yes [name], [name], MD  BELBUCA 150 MCG FILM Place 150 mcg under the tongue 2 (two) times daily.  11/22/18   [provider]  levofloxacin (LEVAQUIN) 500 MG tablet Take 1 tablet (500 mg total) by mouth daily. Patient not taking: Reported on 06/10/2019 06/04/19   [name], MD  SitaGLIPtin-MetFORMIN HCl 50-1000 MG TB24 Take 1 tablet by mouth 2 (two) times daily. For diabetes 06/09/19   [name], MD    Physical Exam: Vitals:   06/10/19 1213 06/10/19 1230 06/10/19 1300 06/10/19 1330  BP: (!) 174/84 (!) 164/86  Pulse:  60 66 68  Resp: 20 (!) 22 12 18   Temp: 98.5 F (36.9 C)     SpO2:  95% 95% 98%  Weight:      Height:        Constitutional: NAD, calm, comfortable Vitals:   06/10/19 1213 06/10/19 1230 06/10/19 1300 06/10/19 1330  BP: (!) 174/84 (!) 164/86    Pulse:  60 66 68  Resp: 20 (!) 22 12 18   Temp: 98.5 F (36.9 C)     SpO2:  95% 95% 98%  Weight:      Height:       Eyes: PERRL, lids and conjunctivae normal ENMT: Mucous membranes are moist. Posterior pharynx clear of any exudate or lesions.Normal dentition.  Neck: normal, supple, no masses, no thyromegaly Respiratory: clear to auscultation bilaterally, no wheezing, no crackles. Normal respiratory effort. No accessory muscle use.  Cardiovascular: Regular rate and rhythm, no murmurs / rubs / gallops. No extremity edema. 2+ pedal pulses. No carotid bruits.  Abdomen: no tenderness, no masses palpated. No hepatosplenomegaly. Bowel sounds positive.  Musculoskeletal: no clubbing / cyanosis. No joint deformity upper and lower extremities. Good ROM, no contractures. Normal muscle tone.  Skin: no rashes, lesions, ulcers. No induration Neurologic: CN 2-12 grossly intact. Sensation intact, DTR normal. Strength 5/5 in all 4.    Psychiatric: Normal judgment and insight. Alert and oriented x 3. Normal mood.    Labs on Admission: I have personally reviewed following labs and imaging studies  CBC: Recent Labs  Lab 06/10/19 1337  WBC 6.3  NEUTROABS 3.0  HGB 12.9  HCT 39.2  MCV 86.5  PLT 628   Basic Metabolic Panel: Recent Labs  Lab 06/10/19 1337  NA 141  K 3.8  CL 103  CO2 30  GLUCOSE 148*  BUN 15  CREATININE 0.73  CALCIUM 9.1   GFR: Estimated Creatinine Clearance: 63 mL/min (by C-G formula based on SCr of 0.73 mg/dL). Liver Function Tests: Recent Labs  Lab 06/10/19 1337  AST 16  ALT 17  ALKPHOS 85  BILITOT 0.4  PROT 6.8  ALBUMIN 3.4*   No results for input(s): LIPASE, AMYLASE in the last 168 hours. No results for input(s): AMMONIA in the last 168 hours. Coagulation Profile: No results for input(s): INR, PROTIME in the last 168 hours. Cardiac Enzymes: No results for input(s): CKTOTAL, CKMB, CKMBINDEX, TROPONINI in the last 168 hours. BNP (last 3 results) No results for input(s): PROBNP in the last 8760 hours. HbA1C: No results for input(s): HGBA1C in the last 72 hours. CBG: No results for input(s): GLUCAP in the last 168 hours. Lipid Profile: No results for input(s): CHOL, HDL, LDLCALC, TRIG, CHOLHDL, LDLDIRECT in the last 72 hours. Thyroid Function Tests: No results for input(s): TSH, T4TOTAL, FREET4, T3FREE, THYROIDAB in the last 72 hours. Anemia Panel: No results for input(s): VITAMINB12, FOLATE, FERRITIN, TIBC, IRON, RETICCTPCT in the last 72 hours. Urine analysis:    Component Value Date/Time   COLORURINE YELLOW 12/11/2018 2105   APPEARANCEUR CLEAR 12/11/2018 2105   APPEARANCEUR Cloudy (A) 10/22/2018 0913   LABSPEC 1.014 12/11/2018 2105   PHURINE 6.0 12/11/2018 2105   GLUCOSEU 50 (A) 12/11/2018 2105   HGBUR SMALL (A) 12/11/2018 2105   BILIRUBINUR NEGATIVE 12/11/2018 2105   BILIRUBINUR Negative 10/22/2018 [place] NEGATIVE 12/11/2018 2105   PROTEINUR NEGATIVE  12/11/2018 2105   NITRITE NEGATIVE 12/11/2018 2105   LEUKOCYTESUR TRACE (A) 12/11/2018 2105   LEUKOCYTESUR 1+ (A) 10/22/2018 0913  Radiological Exams on Admission: Ct Head Wo Contrast  Result Date: 06/10/2019 CLINICAL DATA:  Left-sided weakness. EXAM: CT HEAD WITHOUT CONTRAST TECHNIQUE: Contiguous axial images were obtained from the base of the skull through the vertex without intravenous contrast. COMPARISON:  Head CT 12/11/2018 and MRI 12/12/2018 FINDINGS: Brain: There is no evidence of acute infarct, intracranial hemorrhage, mass, midline shift, or extra-axial fluid collection. A chronic lacunar infarct is again noted in the left thalamus. The ventricles and sulci are normal. Vascular: Calcified atherosclerosis at the skull base. No hyperdense vessel. Skull: No fracture or focal osseous lesion. Sinuses/Orbits: Chronically small left maxillary sinus. Small volume bubbly fluid in the left sphenoid sinus. Clear mastoid air cells. Right cataract extraction. Other: None. IMPRESSION: 1. No evidence of acute intracranial abnormality. 2. Chronic left thalamic lacunar infarct. Electronically Signed   By: Logan Bores M.D.   On: 06/10/2019 14:56   Mr Angio Head Wo Contrast  Result Date: 06/10/2019 CLINICAL DATA:  Altered level of consciousness. Left-sided weakness beginning yesterday. EXAM: MRA HEAD WITHOUT CONTRAST TECHNIQUE: Angiographic images of the Circle of Willis were obtained using MRA technique without intravenous contrast. COMPARISON:  MRI same day.  Head CT same day.  MRA 06/06/2018 FINDINGS: Both internal carotid arteries are widely patent through the skull base and siphon regions. The anterior and middle cerebral vessels are patent. There is a 50% stenosis of the main M2 branch of the right middle cerebral artery. Both vertebral arteries are patent to the basilar. No basilar stenosis. There is severe stenosis of both posterior cerebral arteries at the P1 P2 junction regions. IMPRESSION:  Worsening intracranial atherosclerotic disease with worsening stenoses in both posterior cerebral arteries and in the right M2 level of the right MCA. Electronically Signed   By: Nelson Chimes M.D.   On: 06/10/2019 15:21   Mr Brain Wo Contrast  Result Date: 06/10/2019 CLINICAL DATA:  Altered level of consciousness. Unexplained. Episode of left-sided weakness yesterday morning which occurred again today. EXAM: MRI HEAD WITHOUT CONTRAST TECHNIQUE: Multiplanar, multiecho pulse sequences of the brain and surrounding structures were obtained without intravenous contrast. COMPARISON:  MRI brain 12/12/2018. FINDINGS: Brain: Remote lacunar infarcts of the left thalamus are again noted. A lacunar infarct of the posterior left corona radiata is new since the prior study, but not acute. The diffusion-weighted images demonstrate no acute or subacute infarction. The ventricles are of normal size. No significant white matter disease is present otherwise a remote lacunar infarct is present in the inferior right cerebellum. Vascular: Flow is present in the major intracranial arteries. Skull and upper cervical spine: The craniocervical junction is normal. Upper cervical spine is within normal limits. Marrow signal is unremarkable. Sinuses/Orbits: Chronic left maxillary sinus disease is present with a shrunken sinus. The paranasal sinuses mastoid air cells are clear. A right lens replacement is present. Globes and orbits are otherwise within normal limits. IMPRESSION: 1. No acute intracranial abnormality to explain left-sided weakness. 2. Stable remote lacunar infarcts of the left thalamus and inferior right cerebellum. 3. Posterior left corona radiata lacunar infarct is new since January, but not acute. Electronically Signed   By: San Morelle M.D.   On: 06/10/2019 15:22    EKG: Independently reviewed.  Sinus rhythm with no acute ST-T wave changes.  Assessment/Plan Active Problems:   Hypertension   GAD  (generalized anxiety disorder)   Type 2 diabetes mellitus (HCC)   TIA (transient ischemic attack)   Left-sided weakness     Left-sided weakness/TIA: CT head as well  as MRI of the head and MRA of the brain are all unremarkable for any new stroke or any stenosis.  Official reading of the MRI of the brain however does mention that she has stable remote lacunar infarcts of the left thalamus and inferior right cerebellum and also posterior left coronary lacunar infarct which is new since January but not acute.  Patient is already on aspirin 325 mg and she did take it this morning.  She did not qualify for TPA due to symptoms being more than 3 hours.  We will resume patient's home dose of statin.  Will allow permissive hypertension per neurology recommendation and will treat with PRN labetalol.  PT OT consult.  Bedside swallow evaluation.  Transthoracic echo.  Carotid Doppler.  Hypertension: We will allow permissive hypertension as mentioned above.  Type 2 diabetes mellitus: Last hemoglobin A1c 7.7.  She is not on any medications for some reason.  Will place her on SSI.  Mild intermittent asthma: Stable.  Not in acute exacerbation.  Resume home dose of albuterol.  Chronic grade 1 diastolic dysfunction: Resume home dose of Lasix.  Not in exacerbation currently.  Generalized anxiety disorder: She takes Xanax 3 times a day.  Will resume that.  DVT prophylaxis: Lovenox Code Status: Full code Family Communication: None present at bedside.  Discussed plan of care in detail with the patient. Disposition Plan: Likely home tomorrow. Consults called: Telemetry neurology Admission status: Observation   Darliss Cheney MD [facility] Pager [phone]  If 7PM-7AM, please contact night-coverage [email or web address] Password [facility]  06/10/2019, 4:12 PM

## 2019-06-10 NOTE — ED Triage Notes (Signed)
C/o left sided weakness onset yesterday, intermittent since 0730 yesterday and went to bed, states she noticed it again this am around 0845.

## 2019-06-10 NOTE — Progress Notes (Signed)
[facility] TeleNeurology Consult Services  Stat Consult  Date of Service:   06/10/2019 13:02:24  Impression:     .  Rule Out Acute Ischemic Stroke     .  Transient Ischemic Attack  Comments/Sign-Out: Patient with symptoms of left sided numbness and weakness since yesterday. They appear to fluctuate, TIA vs CVA. Outside window for any intervention given that symptoms present since 7am on 07/14 (>24hrs)  CT HEAD: Not Reviewed pending completion  Metrics: [facility] Notification Time: 06/10/2019 13:01:38 Stamp Time: 06/10/2019 13:02:24 Callback Response Time: 06/10/2019 13:08:45 Video Start Time: 06/10/2019 13:23:30  Our recommendations are outlined below.  Recommendations:     .  continue asa and statin     .  check lipid panel  Imaging Studies:     .  MRI Head     .  MRA Head and Neck Without Contrast When Available - Stroke Protocol     .  Carotid Dopplers     .  Echocardiogram - Transthoracic Echocardiogram  Therapies:     .  Physical Therapy, Occupational Therapy, Speech Therapy Assessment When Applicable  Disposition: Neurology Follow Up Recommended  Sign Out:     .  Discussed with Emergency Department Provider  ----------------------------------------------------------------------------------------------------  Chief Complaint: left sided numbness  History of Present Illness: Patient is a 68 year old Female.  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 She had 4 episodes yesterday. Each lasted 10-54min. No neck pain but did have weakness as well. It involves the arm and the leg. She also had blurry vision with these episodes. no dizziness, no discoordination right hand and face numbness from prior CVA 2019  Examination: BP(174/84), 1A: Level of Consciousness - Alert; keenly responsive + 0 1B: Ask Month and Age - Both Questions Right + 0 1C:  Blink Eyes & Squeeze Hands - Performs Both Tasks + 0 2: Test Horizontal Extraocular Movements - Normal + 0 3: Test Visual Fields - Partial Hemianopia + 1 4: Test Facial Palsy (Use Grimace if Obtunded) - Normal symmetry + 0 5A: Test Left Arm Motor Drift - Drift, but doesn't hit bed + 1 5B: Test Right Arm Motor Drift - No Drift for 10 Seconds + 0 6A: Test Left Leg Motor Drift - Drift, but doesn't hit bed + 1 6B: Test Right Leg Motor Drift - No Drift for 5 Seconds + 0 7: Test Limb Ataxia (FNF/Heel-Shin) - No Ataxia + 0 8: Test Sensation - Normal; No sensory loss + 0 9: Test Language/Aphasia - Normal; No aphasia + 0 10: Test Dysarthria - Normal + 0 11: Test Extinction/Inattention - No abnormality + 0  NIHSS Score: 3  Patient/Family was informed the Neurology Consult would happen via TeleHealth consult by way of interactive audio and video telecommunications and consented to receiving care in this manner.  Due to the immediate potential for life-threatening deterioration due to underlying acute neurologic illness, I spent 35 minutes providing critical care. This time includes time for face to face visit via telemedicine, review of medical records, imaging studies and discussion of findings with providers, the patient and/or family.   Dr Jacqulynn Cadet Lyden Redner   [facility] [phone]   Case [id number]

## 2019-06-10 NOTE — ED Notes (Signed)
ED TO INPATIENT HANDOFF REPORT  ED Nurse Name and Phone #:  Wells Guiles  [phone]  S Name/Age/Gender Yvonne [name] 68 y.o. female Room/Bed: APAH2/APAH2  Code Status   Code Status: Prior  Home/SNF/Other Home Patient oriented to: self, place, time and situation Is this baseline? Yes   Triage Complete: Triage complete  Chief Complaint left side weakness  Triage Note C/o left sided weakness onset yesterday, intermittent since 0730 yesterday and went to bed, states she noticed it again this am around 0845.    Allergies Allergies  Allergen Reactions  . Penicillins Hives    Has patient had a PCN reaction causing immediate rash, facial/tongue/throat swelling, SOB or lightheadedness with hypotension: Yes Has patient had a PCN reaction causing severe rash involving mucus membranes or skin necrosis: Unk Has patient had a PCN reaction that required hospitalization: No Has patient had a PCN reaction occurring within the last 10 years: No If all of the above answers are "NO", then may proceed with Cephalosporin use.   . Gabapentin Other (See Comments)    Causes a lot of lethargy at a higher dose  . Lisinopril Cough  . Soap Rash    DIAL soap    Level of Care/Admitting Diagnosis ED Disposition    ED Disposition Condition [facility]: [facility] [[id number]]  Level of Care: Med-Surg [16]  Covid Evaluation: Confirmed COVID Negative  Diagnosis: TIA (transient ischemic attack) [130865]  Admitting Physician: Darliss Cheney [[id number]]  Attending Physician: Darliss Cheney [[id number]]  PT Class (Do Not Modify): Observation [104]  PT Acc Code (Do Not Modify): Observation [10022]       B Medical/Surgery History Past Medical History:  Diagnosis Date  . Anxiety   . Arthritis    left hand  . Asthma    daily and prn inhalers  . Chronic cough   . Chronic otitis media 12/2017  . Full dentures   . GERD (gastroesophageal reflux disease)   . History of stroke  09/2017   weakness right hand, numbness right side face  . Hyperlipidemia   . Hypertension    states under control with meds., has been on med. x a long time, per pt.  . Non-insulin dependent type 2 diabetes mellitus ([place])   . Overactive bladder   . Recurrent acute suppurative otitis media without spontaneous rupture of left tympanic membrane 02/19/2018  . Transient cerebral ischemia    Past Surgical History:  Procedure Laterality Date  . ABDOMINAL HYSTERECTOMY     complete  . CATARACT EXTRACTION W/ INTRAOCULAR LENS IMPLANT Right   . CHOLECYSTECTOMY    . COLONOSCOPY N/A 08/17/2015   Procedure: COLONOSCOPY;  Surgeon: Rogene Houston, MD;  [facility];  Service: Endoscopy;  Laterality: N/A;  200 - moved to 7:30 - Ann notified pt  . GAS INSERTION Right    x 2 - eye  . LACRIMAL DUCT EXPLORATION Bilateral    removal of tear ducts  . MYRINGOTOMY WITH TUBE PLACEMENT Bilateral 01/28/2018   Procedure: BILATERAL MYRINGOTOMY WITH TUBE PLACEMENT;  Surgeon: Leta Baptist, MD;  Location: [facility];  Service: ENT;  Laterality: Bilateral;  . VITRECTOMY Left 09/14/2015     A IV Location/Drains/Wounds Patient Lines/Drains/Airways Status   Active Line/Drains/Airways    Name:   Placement date:   Placement time:   Site:   Days:   Myringotomy Tube   01/28/18    0809    Bilateral Ears   498  Incision (Closed) 01/28/18 Ear Left   01/28/18    0813     498   Incision (Closed) 01/28/18 Ear Right   01/28/18    0813     498          Intake/Output Last 24 hours No intake or output data in the 24 hours ending 06/10/19 1846  Labs/Imaging Results for orders placed or performed during the hospital encounter of 06/10/19 (from the past 48 hour(s))  CBC with Differential/Platelet     Status: None   Collection Time: 06/10/19  1:37 PM  Result Value Ref Range   WBC 6.3 4.0 - 10.5 K/uL   RBC 4.53 3.87 - 5.11 MIL/uL   Hemoglobin 12.9 12.0 - 15.0 g/dL   HCT 39.2 36.0 - 46.0 %   MCV 86.5  80.0 - 100.0 fL   MCH 28.5 26.0 - 34.0 pg   MCHC 32.9 30.0 - 36.0 g/dL   RDW 12.2 11.5 - 15.5 %   Platelets 262 150 - 400 K/uL   nRBC 0.0 0.0 - 0.2 %   Neutrophils Relative % 47 %   Neutro Abs 3.0 1.7 - 7.7 K/uL   Lymphocytes Relative 45 %   Lymphs Abs 2.8 0.7 - 4.0 K/uL   Monocytes Relative 7 %   Monocytes Absolute 0.4 0.1 - 1.0 K/uL   Eosinophils Relative 1 %   Eosinophils Absolute 0.1 0.0 - 0.5 K/uL   Basophils Relative 0 %   Basophils Absolute 0.0 0.0 - 0.1 K/uL   WBC Morphology PLASMA CELLS     Comment: FEW SMUDGE CELLS   Immature Granulocytes 0 %   Abs Immature Granulocytes 0.02 0.00 - 0.07 K/uL   Abnormal Lymphocytes Present PRESENT    Reactive, Benign Lymphocytes PRESENT     Comment: Performed at [facility], [address], [place], [place] [address]  Comprehensive metabolic panel     Status: Abnormal   Collection Time: 06/10/19  1:37 PM  Result Value Ref Range   Sodium 141 135 - 145 mmol/L   Potassium 3.8 3.5 - 5.1 mmol/L   Chloride 103 98 - 111 mmol/L   CO2 30 22 - 32 mmol/L   Glucose, Bld 148 (H) 70 - 99 mg/dL   BUN 15 8 - 23 mg/dL   Creatinine, Ser 0.73 0.44 - 1.00 mg/dL   Calcium 9.1 8.9 - 10.3 mg/dL   Total Protein 6.8 6.5 - 8.1 g/dL   Albumin 3.4 (L) 3.5 - 5.0 g/dL   AST 16 15 - 41 U/L   ALT 17 0 - 44 U/L   Alkaline Phosphatase 85 38 - 126 U/L   Total Bilirubin 0.4 0.3 - 1.2 mg/dL   GFR calc non Af Amer >60 >60 mL/min   GFR calc Af Amer >60 >60 mL/min   Anion gap 8 5 - 15    Comment: Performed at [facility], [address], [place], [place] [address]  SARS Coronavirus 2 (CEPHEID - Performed in [place] hospital lab), Hosp Order     Status: None   Collection Time: 06/10/19  1:54 PM   Specimen: Nasopharyngeal Swab  Result Value Ref Range   SARS Coronavirus 2 NEGATIVE NEGATIVE    Comment: (NOTE) If result is NEGATIVE SARS-CoV-2 target nucleic acids are NOT DETECTED. The SARS-CoV-2 RNA is generally detectable in upper and lower  respiratory  specimens during the acute phase of infection. The lowest  concentration of SARS-CoV-2 viral copies this assay can detect is 250  copies /  mL. A negative result does not preclude SARS-CoV-2 infection  and should not be used as the sole basis for treatment or other  patient management decisions.  A negative result may occur with  improper specimen collection / handling, submission of specimen other  than nasopharyngeal swab, presence of viral mutation(s) within the  areas targeted by this assay, and inadequate number of viral copies  (<250 copies / mL). A negative result must be combined with clinical  observations, patient history, and epidemiological information. If result is POSITIVE SARS-CoV-2 target nucleic acids are DETECTED. The SARS-CoV-2 RNA is generally detectable in upper and lower  respiratory specimens dur ing the acute phase of infection.  Positive  results are indicative of active infection with SARS-CoV-2.  Clinical  correlation with patient history and other diagnostic information is  necessary to determine patient infection status.  Positive results do  not rule out bacterial infection or co-infection with other viruses. If result is PRESUMPTIVE POSTIVE SARS-CoV-2 nucleic acids MAY BE PRESENT.   A presumptive positive result was obtained on the submitted specimen  and confirmed on repeat testing.  While 2019 novel coronavirus  (SARS-CoV-2) nucleic acids may be present in the submitted sample  additional confirmatory testing may be necessary for epidemiological  and / or clinical management purposes  to differentiate between  SARS-CoV-2 and other Sarbecovirus currently known to infect humans.  If clinically indicated additional testing with an alternate test  methodology [phone]) is advised. The SARS-CoV-2 RNA is generally  detectable in upper and lower respiratory sp ecimens during the acute  phase of infection. The expected result is Negative. Fact Sheet for  Patients:  StrictlyIdeas.no Fact Sheet for Healthcare Providers: BankingDealers.co.za This test is not yet approved or cleared by the [place] FDA and has been authorized for detection and/or diagnosis of SARS-CoV-2 by FDA under an Emergency Use Authorization (EUA).  This EUA will remain in effect (meaning this test can be used) for the duration of the COVID-19 declaration under Section 564(b)(1) of the Act, 21 U.S.C. section 360bbb-3(b)(1), unless the authorization is terminated or revoked sooner. Performed at [facility], [address] [place], [place] [address]    Ct Head Wo Contrast  Result Date: 06/10/2019 CLINICAL DATA:  Left-sided weakness. EXAM: CT HEAD WITHOUT CONTRAST TECHNIQUE: Contiguous axial images were obtained from the base of the skull through the vertex without intravenous contrast. COMPARISON:  Head CT 12/11/2018 and MRI 12/12/2018 FINDINGS: Brain: There is no evidence of acute infarct, intracranial hemorrhage, mass, midline shift, or extra-axial fluid collection. A chronic lacunar infarct is again noted in the left thalamus. The ventricles and sulci are normal. Vascular: Calcified atherosclerosis at the skull base. No hyperdense vessel. Skull: No fracture or focal osseous lesion. Sinuses/Orbits: Chronically small left maxillary sinus. Small volume bubbly fluid in the left sphenoid sinus. Clear mastoid air cells. Right cataract extraction. Other: None. IMPRESSION: 1. No evidence of acute intracranial abnormality. 2. Chronic left thalamic lacunar infarct. Electronically Signed   By: Logan Bores M.D.   On: 06/10/2019 14:56   Mr Angio Head Wo Contrast  Result Date: 06/10/2019 CLINICAL DATA:  Altered level of consciousness. Left-sided weakness beginning yesterday. EXAM: MRA HEAD WITHOUT CONTRAST TECHNIQUE: Angiographic images of the Circle of Willis were obtained using MRA technique without intravenous contrast. COMPARISON:  MRI  same day.  Head CT same day.  MRA 06/06/2018 FINDINGS: Both internal carotid arteries are widely patent through the skull base and siphon regions. The anterior and middle cerebral vessels are  patent. There is a 50% stenosis of the main M2 branch of the right middle cerebral artery. Both vertebral arteries are patent to the basilar. No basilar stenosis. There is severe stenosis of both posterior cerebral arteries at the P1 P2 junction regions. IMPRESSION: Worsening intracranial atherosclerotic disease with worsening stenoses in both posterior cerebral arteries and in the right M2 level of the right MCA. Electronically Signed   By: Nelson Chimes M.D.   On: 06/10/2019 15:21   Mr Brain Wo Contrast  Result Date: 06/10/2019 CLINICAL DATA:  Altered level of consciousness. Unexplained. Episode of left-sided weakness yesterday morning which occurred again today. EXAM: MRI HEAD WITHOUT CONTRAST TECHNIQUE: Multiplanar, multiecho pulse sequences of the brain and surrounding structures were obtained without intravenous contrast. COMPARISON:  MRI brain 12/12/2018. FINDINGS: Brain: Remote lacunar infarcts of the left thalamus are again noted. A lacunar infarct of the posterior left corona radiata is new since the prior study, but not acute. The diffusion-weighted images demonstrate no acute or subacute infarction. The ventricles are of normal size. No significant white matter disease is present otherwise a remote lacunar infarct is present in the inferior right cerebellum. Vascular: Flow is present in the major intracranial arteries. Skull and upper cervical spine: The craniocervical junction is normal. Upper cervical spine is within normal limits. Marrow signal is unremarkable. Sinuses/Orbits: Chronic left maxillary sinus disease is present with a shrunken sinus. The paranasal sinuses mastoid air cells are clear. A right lens replacement is present. Globes and orbits are otherwise within normal limits. IMPRESSION: 1. No acute  intracranial abnormality to explain left-sided weakness. 2. Stable remote lacunar infarcts of the left thalamus and inferior right cerebellum. 3. Posterior left corona radiata lacunar infarct is new since January, but not acute. Electronically Signed   By: San Morelle M.D.   On: 06/10/2019 15:22    Pending Labs Unresulted Labs (From admission, onward)    Start     Ordered   06/10/19 1337  Pathologist smear review  Once,   R     06/10/19 1337   Signed and Held  Hemoglobin A1c  Tomorrow morning,   R     Signed and Held   Signed and Held  Lipid panel  Tomorrow morning,   R    Comments: Fasting    Signed and Held   Signed and Held  CBC  (enoxaparin (LOVENOX)    CrCl >/= 30 ml/min)  Once,   R    Comments: Baseline for enoxaparin therapy IF NOT ALREADY DRAWN.  Notify MD if PLT < 100 K.    Signed and Held   Signed and Held  Creatinine, serum  (enoxaparin (LOVENOX)    CrCl >/= 30 ml/min)  Once,   R    Comments: Baseline for enoxaparin therapy IF NOT ALREADY DRAWN.    Signed and Held   Signed and Held  Creatinine, serum  (enoxaparin (LOVENOX)    CrCl >/= 30 ml/min)  Weekly,   R    Comments: while on enoxaparin therapy    Signed and Held          Vitals/Pain Today's Vitals   06/10/19 1300 06/10/19 1330 06/10/19 1520 06/10/19 1700  BP:    (!) 139/58  Pulse: 66 68  69  Resp: 12 18    Temp:      SpO2: 95% 98%  96%  Weight:      Height:      PainSc:   0-No pain     Isolation  Precautions No active isolations  Medications Medications  LORazepam (ATIVAN) injection 1 mg (1 mg Intravenous Given 06/10/19 1411)    Mobility walks Low fall risk   Focused Assessments Neuro Assessment Handoff:     NIH Stroke Scale ( + Modified Stroke Scale Criteria)  Interval: Shift assessment Level of Consciousness (1a.)   : Alert, keenly responsive LOC Questions (1b. )   +: Answers both questions correctly LOC Commands (1c. )   + : Performs both tasks correctly Best Gaze (2. )  +:  Normal Visual (3. )  +: Partial hemianopia Facial Palsy (4. )    : Normal symmetrical movements Motor Arm, Left (5a. )   +: Drift Motor Arm, Right (5b. )   +: No drift Motor Leg, Left (6a. )   +: Drift Motor Leg, Right (6b. )   +: No drift Limb Ataxia (7. ): Absent Sensory (8. )   +: Normal, no sensory loss Best Language (9. )   +: No aphasia Dysarthria (10. ): Normal Extinction/Inattention (11.)   +: No Abnormality Modified SS Total  +: 3 Complete NIHSS TOTAL: 3 Last date known well: 06/09/19 Last time known well: 0730 Neuro Assessment: Within Defined Limits Neuro Checks:   Initial (06/10/19 1210)  Last Documented NIHSS Modified Score: 3 (06/10/19 1400) Has TPA been given? No

## 2019-06-10 NOTE — ED Provider Notes (Signed)
[facility] EMERGENCY DEPARTMENT Provider Note   CSN: [id number] Arrival date & time: 06/10/19  1156  An emergency department physician performed an initial assessment on this suspected stroke patient at 1331.  History   Chief Complaint Chief Complaint  Patient presents with   Weakness    HPI Yvonne [name] is a 68 y.o. female.     Patient states that yesterday she had left-sided weakness and numbness.  This happened several times during the day and has happened today she feels better now  The history is provided by the patient. No language interpreter was used.  Weakness Severity:  Mild Onset quality:  Sudden Timing:  Unable to specify Progression:  Improving Chronicity:  New Context: not alcohol use   Relieved by:  Nothing Worsened by:  Nothing Ineffective treatments:  None tried Associated symptoms: no abdominal pain, no chest pain, no cough, no diarrhea, no frequency, no headaches and no seizures     Past Medical History:  Diagnosis Date   Anxiety    Arthritis    left hand   Asthma    daily and prn inhalers   Chronic cough    Chronic otitis media 12/2017   Full dentures    GERD (gastroesophageal reflux disease)    History of stroke 09/2017   weakness right hand, numbness right side face   Hyperlipidemia    Hypertension    states under control with meds., has been on med. x a long time, per pt.   Non-insulin dependent type 2 diabetes mellitus ([place])    Overactive bladder    Recurrent acute suppurative otitis media without spontaneous rupture of left tympanic membrane 02/19/2018   Transient cerebral ischemia     Patient Active Problem List   Diagnosis Date Noted   Right sided numbness 12/11/2018   Hypokalemia 12/11/2018   Benzodiazepine dependence, continuous ([place]) 10/22/2018   Chronic migraine without aura without status migrainosus, not intractable 02/19/2018   Chronic diastolic CHF (congestive heart failure) ([place]) 03/21/2017    Hemiparesis affecting dominant side as late effect of cerebrovascular accident (CVA) ([place]) 10/15/2016   Lethargy    Weakness 09/28/2016   Pain of both hip joints 06/18/2016   Pain syndrome, chronic 06/18/2016   Chronic cough 05/17/2016   Hyperlipidemia with target LDL less than 70 11/04/2015   Macular hole of right eye 09/14/2015   Obesity 07/20/2015   Osteopenia 07/20/2015   Type 2 diabetes mellitus ([place]) 07/20/2015   Overactive bladder    Hypertension    GAD (generalized anxiety disorder)    Acid reflux disease 08/17/2014    Past Surgical History:  Procedure Laterality Date   ABDOMINAL HYSTERECTOMY     complete   CATARACT EXTRACTION W/ INTRAOCULAR LENS IMPLANT Right    CHOLECYSTECTOMY     COLONOSCOPY N/A 08/17/2015   Procedure: COLONOSCOPY;  Surgeon: Rogene Houston, MD;  Location: [facility];  Service: Endoscopy;  Laterality: N/A;  200 - moved to 7:30 - Ann notified pt   GAS INSERTION Right    x 2 - eye   LACRIMAL DUCT EXPLORATION Bilateral    removal of tear ducts   MYRINGOTOMY WITH TUBE PLACEMENT Bilateral 01/28/2018   Procedure: BILATERAL MYRINGOTOMY WITH TUBE PLACEMENT;  Surgeon: Leta Baptist, MD;  Location: [facility];  Service: ENT;  Laterality: Bilateral;   VITRECTOMY Left 09/14/2015     OB History   No obstetric history on file.      Home Medications    Prior to  Admission medications   Medication Sig Start Date End Date Taking? Authorizing Provider  albuterol (VENTOLIN HFA) 108 (90 Base) MCG/ACT inhaler Inhale 2 puffs into the lungs every 6 (six) hours as needed for wheezing or shortness of breath. 10/22/18   [name], MD  ALPRAZolam Duanne Moron) 1 MG tablet Take 1 tablet (1 mg total) by mouth 3 (three) times daily. 06/08/19   [name], MD  amLODipine (NORVASC) 10 MG tablet TAKE 1 TABLET (5 MG TOTAL) BY MOUTH DAILY. Patient taking differently: Take 10 mg by mouth every morning.  10/22/18   [name], MD  aspirin EC  325 MG tablet Take 1 tablet (325 mg total) by mouth daily. 12/12/18 12/12/19  Kathie Dike, MD  atorvastatin (LIPITOR) 40 MG tablet TAKE 1 TABLET EVERY DAY AT 6PM Patient taking differently: Take 40 mg by mouth every evening.  10/22/18   [name], MD  BELBUCA 150 MCG FILM Place 150 mcg under the tongue 2 (two) times daily.  11/22/18   [provider]  diclofenac (VOLTAREN) 75 MG EC tablet Take 1 tablet (75 mg total) by mouth 2 (two) times daily. 04/06/19   Sharion Balloon, FNP  ezetimibe (ZETIA) 10 MG tablet Take 1 tablet (10 mg total) by mouth daily. For cholesterol Patient taking differently: Take 10 mg by mouth every evening. For cholesterol 10/22/18   [name], MD  fluticasone furoate-vilanterol (BREO ELLIPTA) 100-25 MCG/INH AEPB Inhale 1 puff into the lungs daily. 10/22/18   [name], MD  furosemide (LASIX) 20 MG tablet Take 1 tablet (20 mg total) by mouth daily. 10/22/18   [name], MD  glucose blood (ACCU-CHEK AVIVA PLUS) test strip Test up to 4 times daily 03/10/19   [name], MD  levofloxacin (LEVAQUIN) 500 MG tablet Take 1 tablet (500 mg total) by mouth daily. 06/04/19   [name], MD  loratadine (CLARITIN) 10 MG tablet Take 1 tablet (10 mg total) by mouth daily. 02/18/19   [name], MD  ondansetron (ZOFRAN-ODT) 8 MG disintegrating tablet Take 1 tablet (8 mg total) by mouth every 6 (six) hours as needed for nausea or vomiting. 06/04/19   [name], MD  pantoprazole (PROTONIX) 40 MG tablet Take 1 tablet (40 mg total) by mouth 2 (two) times daily. For stomach 10/22/18   [name], MD  SitaGLIPtin-MetFORMIN HCl 50-1000 MG TB24 Take 1 tablet by mouth 2 (two) times daily. For diabetes 06/09/19   [name], MD  topiramate (TOPAMAX) 200 MG tablet TAKE 1 TABLET (200 MG TOTAL) BY MOUTH AT BEDTIME. 05/19/19   [name], MD    Family History Family History  Problem Relation Age of Onset   Arthritis Mother    Diabetes Mother     CVA Mother    Stroke Mother    Diabetes Father    Heart disease Father        CABG.  Does not know age of onset   Asthma Father    Hepatitis C Sister    Arthritis Brother    Cancer Brother        metastic cancer   Peripheral Artery Disease Daughter    Arthritis-Osteo Son     Social History Social History   Tobacco Use   Smoking status: Never Smoker   Smokeless tobacco: Never Used  Substance Use Topics   Alcohol use: No   Drug use: No     Allergies   Penicillins, Gabapentin, Lisinopril, and Soap   Review of Systems Review of Systems  Constitutional: Negative for appetite change and fatigue.  HENT: Negative for congestion, ear discharge and sinus pressure.   Eyes: Negative for discharge.  Respiratory: Negative for cough.   Cardiovascular: Negative for chest pain.  Gastrointestinal: Negative for abdominal pain and diarrhea.  Genitourinary: Negative for frequency and hematuria.  Musculoskeletal: Negative for back pain.  Skin: Negative for rash.  Neurological: Positive for weakness. Negative for seizures and headaches.  Psychiatric/Behavioral: Negative for hallucinations.     Physical Exam Updated Vital Signs BP (!) 164/86    Pulse 68    Temp 98.5 F (36.9 C)    Resp 18    Ht 5' (1.524 m)    Wt 82 kg    SpO2 98%    BMI 35.31 kg/m   Physical Exam Vitals signs and nursing note reviewed.  Constitutional:      Appearance: She is well-developed.  HENT:     Head: Normocephalic.     Nose: Nose normal.  Eyes:     General: No scleral icterus.    Conjunctiva/sclera: Conjunctivae normal.  Neck:     Musculoskeletal: Neck supple.     Thyroid: No thyromegaly.  Cardiovascular:     Rate and Rhythm: Normal rate and regular rhythm.     Heart sounds: No murmur. No friction rub. No gallop.   Pulmonary:     Breath sounds: No stridor. No wheezing or rales.  Chest:     Chest wall: No tenderness.  Abdominal:     General: There is no distension.      Tenderness: There is no abdominal tenderness. There is no rebound.  Musculoskeletal: Normal range of motion.  Lymphadenopathy:     Cervical: No cervical adenopathy.  Skin:    Findings: No erythema or rash.  Neurological:     Mental Status: She is oriented to person, place, and time.     Motor: No abnormal muscle tone.     Coordination: Coordination normal.  Psychiatric:        Behavior: Behavior normal.      ED Treatments / Results  Labs (all labs ordered are listed, but only abnormal results are displayed) Labs Reviewed  COMPREHENSIVE METABOLIC PANEL - Abnormal; Notable for the following components:      Result Value   Glucose, Bld 148 (*)    Albumin 3.4 (*)    All other components within normal limits  SARS CORONAVIRUS 2 (HOSPITAL ORDER, [facility])  CBC WITH DIFFERENTIAL/PLATELET  PATHOLOGIST SMEAR REVIEW    EKG EKG Interpretation  Date/Time:  Wednesday June 10 2019 14:03:26 EDT Ventricular Rate:  69 PR Interval:    QRS Duration: 90 QT Interval:  446 QTC Calculation: 478 R Axis:   14 Text Interpretation:  Sinus rhythm Borderline T wave abnormalities Confirmed by Milton Ferguson [phone]) on 06/10/2019 3:12:24 PM   Radiology Ct Head Wo Contrast  Result Date: 06/10/2019 CLINICAL DATA:  Left-sided weakness. EXAM: CT HEAD WITHOUT CONTRAST TECHNIQUE: Contiguous axial images were obtained from the base of the skull through the vertex without intravenous contrast. COMPARISON:  Head CT 12/11/2018 and MRI 12/12/2018 FINDINGS: Brain: There is no evidence of acute infarct, intracranial hemorrhage, mass, midline shift, or extra-axial fluid collection. A chronic lacunar infarct is again noted in the left thalamus. The ventricles and sulci are normal. Vascular: Calcified atherosclerosis at the skull base. No hyperdense vessel. Skull: No fracture or focal osseous lesion. Sinuses/Orbits: Chronically small left maxillary sinus. Small volume bubbly fluid in the  left sphenoid  sinus. Clear mastoid air cells. Right cataract extraction. Other: None. IMPRESSION: 1. No evidence of acute intracranial abnormality. 2. Chronic left thalamic lacunar infarct. Electronically Signed   By: Logan Bores M.D.   On: 06/10/2019 14:56    Procedures Procedures (including critical care time)  Medications Ordered in ED Medications  LORazepam (ATIVAN) injection 1 mg (1 mg Intravenous Given 06/10/19 1411)     Initial Impression / Assessment and Plan / ED Course  I have reviewed the triage vital signs and the nursing notes.  Pertinent labs & imaging results that were available during my care of the patient were reviewed by me and considered in my medical decision making (see chart for details).       CRITICAL CARE Performed by: Milton Ferguson Total critical care time: 40 minutes Critical care time was exclusive of separately billable procedures and treating other patients. Critical care was necessary to treat or prevent imminent or life-threatening deterioration. Critical care was time spent personally by me on the following activities: development of treatment plan with patient and/or surrogate as well as nursing, discussions with consultants, evaluation of patient's response to treatment, examination of patient, obtaining history from patient or surrogate, ordering and performing treatments and interventions, ordering and review of laboratory studies, ordering and review of radiographic studies, pulse oximetry and re-evaluation of patient's condition.  Patient with a TIA.  Patient was seen by neurology and they recommended MRI and admission for TIA work-up Final Clinical Impressions(s) / ED Diagnoses   Final diagnoses:  None    ED Discharge Orders    None       Milton Ferguson, MD 06/10/19 1515

## 2019-06-11 ENCOUNTER — Observation Stay (HOSPITAL_COMMUNITY): Payer: Medicare HMO

## 2019-06-11 ENCOUNTER — Observation Stay (HOSPITAL_BASED_OUTPATIENT_CLINIC_OR_DEPARTMENT_OTHER): Payer: Medicare HMO

## 2019-06-11 DIAGNOSIS — G459 Transient cerebral ischemic attack, unspecified: Secondary | ICD-10-CM | POA: Diagnosis not present

## 2019-06-11 DIAGNOSIS — F411 Generalized anxiety disorder: Secondary | ICD-10-CM

## 2019-06-11 DIAGNOSIS — R531 Weakness: Secondary | ICD-10-CM | POA: Diagnosis not present

## 2019-06-11 DIAGNOSIS — I69952 Hemiplegia and hemiparesis following unspecified cerebrovascular disease affecting left dominant side: Secondary | ICD-10-CM | POA: Diagnosis not present

## 2019-06-11 DIAGNOSIS — I771 Stricture of artery: Secondary | ICD-10-CM | POA: Diagnosis not present

## 2019-06-11 DIAGNOSIS — I6523 Occlusion and stenosis of bilateral carotid arteries: Secondary | ICD-10-CM | POA: Diagnosis not present

## 2019-06-11 DIAGNOSIS — G43709 Chronic migraine without aura, not intractable, without status migrainosus: Secondary | ICD-10-CM | POA: Diagnosis not present

## 2019-06-11 DIAGNOSIS — I10 Essential (primary) hypertension: Secondary | ICD-10-CM | POA: Diagnosis not present

## 2019-06-11 DIAGNOSIS — E785 Hyperlipidemia, unspecified: Secondary | ICD-10-CM | POA: Diagnosis not present

## 2019-06-11 DIAGNOSIS — R51 Headache: Secondary | ICD-10-CM | POA: Diagnosis not present

## 2019-06-11 LAB — GLUCOSE, CAPILLARY
Glucose-Capillary: 162 mg/dL — ABNORMAL HIGH (ref 70–99)
Glucose-Capillary: 218 mg/dL — ABNORMAL HIGH (ref 70–99)
Glucose-Capillary: 159 mg/dL — ABNORMAL HIGH (ref 70–99)
Glucose-Capillary: 220 mg/dL — ABNORMAL HIGH (ref 70–99)

## 2019-06-11 LAB — LIPID PANEL
Cholesterol: 165 mg/dL (ref 0–200)
HDL: 29 mg/dL — ABNORMAL LOW (ref >40)
LDL Cholesterol: 97 mg/dL (ref 0–99)
Total CHOL/HDL Ratio: 5.7 RATIO
Triglycerides: 196 mg/dL — ABNORMAL HIGH (ref <150)
VLDL: 39 mg/dL (ref 0–40)

## 2019-06-11 LAB — HEMOGLOBIN A1C
Hgb A1c MFr Bld: 8.6 % — ABNORMAL HIGH (ref 4.8–5.6)
Mean Plasma Glucose: 200.12 mg/dL

## 2019-06-11 LAB — ECHOCARDIOGRAM COMPLETE
Height: 60 in
Weight: 2892.44 oz

## 2019-06-11 LAB — PATHOLOGIST SMEAR REVIEW

## 2019-06-11 MED ORDER — CLOPIDOGREL BISULFATE 75 MG PO TABS
75.0000 mg | ORAL_TABLET | Freq: Every day | ORAL | Status: DC
  Administered 2019-06-12: 75 mg via ORAL
  Filled 2019-06-11: qty 1

## 2019-06-11 MED ORDER — INSULIN ASPART 100 UNIT/ML ~~LOC~~ SOLN: 0.0000 [IU] | Freq: Three times a day (TID) | SUBCUTANEOUS | Status: DC

## 2019-06-11 NOTE — Evaluation (Signed)
Occupational Therapy Evaluation Patient Details Name: Yvonne [name] MRN: [id number] DOB: 09-20-1951 Today's Date: 06/11/2019    History of Present Illness Yvonne [name] is a 68 y.o. female with medical history significant of stroke, generalized anxiety disorder, asthma, GERD, hypertension, hyperlipidemia, type 2 diabetes mellitus came to the emergency department with a complaint of left-sided weakness.  According to patient, she started having left-sided weakness yesterday around morning time.  This has been intermittent since then.  She does not have any other complaints such as headache, dizziness, problem with speaking, swallowing, bladder or bowel incontinence, seizure-like activity, chest pain, shortness of breath, fever, chills, sweating or any other complaint.  MRI negative for acute infarct.    Clinical Impression   Pt agreeable to OT evaluation this am, reports no further symptoms since admission. Pt is performing ADLs independently, maneuvering IV pole without difficulty. Pt is at baseline with ADL completion, no further OT services required at this time.     Follow Up Recommendations  No OT follow up    Equipment Recommendations  None recommended by OT       Precautions / Restrictions Precautions Precautions: None Restrictions Weight Bearing Restrictions: No      Mobility Bed Mobility Overal bed mobility: Modified Independent                Transfers Overall transfer level: Modified independent                        ADL either performed or assessed with clinical judgement   ADL Overall ADL's : Modified independent;At baseline                                             Vision Baseline Vision/History: No visual deficits Patient Visual Report: No change from baseline Vision Assessment?: No apparent visual deficits            Pertinent Vitals/Pain Pain Assessment: No/denies pain     Hand Dominance Right   Extremity/Trunk  Assessment Upper Extremity Assessment Upper Extremity Assessment: Overall WFL for tasks assessed   Lower Extremity Assessment Lower Extremity Assessment: Defer to PT evaluation   Cervical / Trunk Assessment Cervical / Trunk Assessment: Normal   Communication Communication Communication: No difficulties   Cognition Arousal/Alertness: Awake/alert Behavior During Therapy: WFL for tasks assessed/performed Overall Cognitive Status: Within Functional Limits for tasks assessed                                                Home Living Family/patient expects to be discharged to:: Private residence Living Arrangements: Spouse/significant other;Other relatives Available Help at Discharge: Family;Available 24 hours/day Type of Home: House Home Access: Stairs to enter [employer] of Steps: 7 Entrance Stairs-Rails: Right;Left;Can reach both Home Layout: One level(2 steps into kitchen)     Bathroom Shower/Tub: Teacher, early years/pre: Standard     Home Equipment: None          Prior Functioning/Environment Level of Independence: Independent        Comments: Patient is independent with ADLs and shopping, community ambulator without assistive device, cares for family members, cares for pets, drives         AM-PAC OT "6  Clicks" Daily Activity     Outcome Measure Help from another person eating meals?: None Help from another person taking care of personal grooming?: None Help from another person toileting, which includes using toliet, bedpan, or urinal?: None Help from another person bathing (including washing, rinsing, drying)?: None Help from another person to put on and taking off regular upper body clothing?: None Help from another person to put on and taking off regular lower body clothing?: None 6 Click Score: 24   End of Session    Activity Tolerance: Patient tolerated treatment well Patient left: in chair;with call bell/phone  within reach  OT Visit Diagnosis: Muscle weakness (generalized) (M62.81)                Time: 8916-9450 OT Time Calculation (min): 13 min Charges:  OT General Charges $OT Visit: 1 Visit OT Evaluation $OT Eval Low Complexity: [place], OTR/L  [phone] 06/11/2019, 9:37 AM

## 2019-06-11 NOTE — Evaluation (Signed)
Physical Therapy Evaluation Patient Details Name: Yvonne [name] MRN: [id number] DOB: [birth year] Today's Date: 06/11/2019   History of Present Illness  Yvonne [name] is a 68 y.o. female with medical history significant of stroke, generalized anxiety disorder, asthma, GERD, hypertension, hyperlipidemia, type 2 diabetes mellitus came to the emergency department with a complaint of left-sided weakness.  According to patient, she started having left-sided weakness yesterday around morning time.  This has been intermittent since then.  She does not have any other complaints such as headache, dizziness, problem with speaking, swallowing, bladder or bowel incontinence, seizure-like activity, chest pain, shortness of breath, fever, chills, sweating or any other complaint.  MRI negative for acute infarct.     Clinical Impression  Patient functioning at baseline for functional mobility and gait.  Plan:  Patient discharged from physical therapy to care of nursing for ambulation daily as tolerated for length of stay.     Follow Up Recommendations No PT follow up    Equipment Recommendations  None recommended by PT    Recommendations for Other Services       Precautions / Restrictions Precautions Precautions: None Restrictions Weight Bearing Restrictions: No      Mobility  Bed Mobility Overal bed mobility: Modified Independent             General bed mobility comments: increased time  Transfers Overall transfer level: Modified independent               General transfer comment: increased time  Ambulation/Gait Ambulation/Gait assistance: Modified independent (Device/Increase time) Gait Distance (Feet): 150 Feet Assistive device: None Gait Pattern/deviations: WFL(Within Functional Limits) Gait velocity: near normal   General Gait Details: demonstrates good return for ambulation on level, inclined and declined surfaces without loss of balance  Stairs Stairs: Yes Stairs  assistance: Modified independent (Device/Increase time) Stair Management: One rail Left;Alternating pattern Number of Stairs: 4 General stair comments: demonstrates good return going up/down steps in stairwell using 1 siderail without loss of balance  Wheelchair Mobility    Modified Rankin (Stroke Patients Only)       Balance Overall balance assessment: No apparent balance deficits (not formally assessed)                                           Pertinent Vitals/Pain Pain Assessment: No/denies pain    Home Living Family/patient expects to be discharged to:: Private residence Living Arrangements: Spouse/significant other;Other relatives Available Help at Discharge: Family;Available 24 hours/day Type of Home: House Home Access: Stairs to enter Entrance Stairs-Rails: Right;Left;Can reach both Entrance Stairs-Number of Steps: 7 Home Layout: One level Home Equipment: Walker - 2 wheels;Shower seat      Prior Function Level of Independence: Independent         Comments: Patient is independent with ADLs and shopping, community ambulator without assistive device, cares for family members, cares for pets, drives     Hand Dominance   Dominant Hand: Right    Extremity/Trunk Assessment   Upper Extremity Assessment Upper Extremity Assessment: Defer to OT evaluation    Lower Extremity Assessment Lower Extremity Assessment: Overall WFL for tasks assessed    Cervical / Trunk Assessment Cervical / Trunk Assessment: Normal  Communication   Communication: No difficulties  Cognition Arousal/Alertness: Awake/alert Behavior During Therapy: WFL for tasks assessed/performed Overall Cognitive Status: Within Functional Limits for tasks assessed  General Comments      Exercises     Assessment/Plan    PT Assessment Patent does not need any further PT services  PT Problem List         PT Treatment  Interventions      PT Goals (Current goals can be found in the Care Plan section)  Acute Rehab PT Goals Patient Stated Goal: return home PT Goal Formulation: With patient Time For Goal Achievement: 06/11/19 Potential to Achieve Goals: Good    Frequency     Barriers to discharge        Co-evaluation               AM-PAC PT "6 Clicks" Mobility  Outcome Measure Help needed turning from your back to your side while in a flat bed without using bedrails?: None Help needed moving from lying on your back to sitting on the side of a flat bed without using bedrails?: None Help needed moving to and from a bed to a chair (including a wheelchair)?: None Help needed standing up from a chair using your arms (e.g., wheelchair or bedside chair)?: None Help needed to walk in hospital room?: None Help needed climbing 3-5 steps with a railing? : None 6 Click Score: 24    End of Session   Activity Tolerance: Patient tolerated treatment well Patient left: in bed;with call bell/phone within reach Nurse Communication: Mobility status PT Visit Diagnosis: Unsteadiness on feet (R26.81);Other abnormalities of gait and mobility (R26.89);Muscle weakness (generalized) (M62.81)    Time: 9357-0177 PT Time Calculation (min) (ACUTE ONLY): 15 min   Charges:   PT Evaluation $PT Eval Low Complexity: 1 Low PT Treatments $Gait Training: 8-22 mins        12:35 PM, 06/11/19 Lonell Grandchild, MPT Physical Therapist with [facility] 336 [phone] office [phone] mobile phone

## 2019-06-11 NOTE — Consult Note (Signed)
Yvonne A. Merlene Laughter, MD     [email or web address]          Yvonne [name] is an 68 y.o. female.   ASSESSMENT/PLAN: 1.  Transient left-sided numbness and weakness which I suspect is likely due to TIA.  Alternatively, she may have had a small infarct not seen on MRI/MRI negative infarct.  She does have risk factors including prior small vessel ischemic strokes, diabetes, hypertension and age.  Patient apparently was taking aspirin 81 mg.  I think I recommend he increase this to 325.  I also suggest Plavix with this medication for about 1 week.  Subsequently, aspirin 325 only is recommended.  She is to continue with other risk factor modifications including diabetes and blood pressure control. 2.  Chronic migraine headaches that is intractable.  I suggest she try other options such as 1 of the new CGRP receptor antagonist or Botox injections.  She should follow-up in the office with [place] in 6 weeks.  In the meantime, she can continue with Topamax.  The patient is 69 year old right-handed white female who presents with the acute onset of left-sided numbness and weakness lasting for a few minutes.  She denies any alteration of consciousness, loss of consciousness dysarthria dysphasia.  She did have some left sided headaches with the symptoms.  She previously has had recurrent right-sided weakness and the headaches which were thought to be due to migraine with aura/complicated migraines.  She has been worked up extensively for those events.  She does have a prior history however of ischemic stroke.  It appears she has been compliant with her medications although she was taking aspirin 81 mg.  She does not report having any palpitation, dyspnea or chest pain with this current event.  The right side was asymptomatic.  There are no reports of clonic activity.  The review of systems otherwise negative.      GENERAL: This a pleasant female who is in no acute distress.  HEENT: No trauma  appreciated.  Neck is supple.  ABDOMEN: soft  EXTREMITIES: No edema   BACK: Normal  SKIN: Normal by inspection.    MENTAL STATUS: Alert and oriented -  including oriented to her age and the month. Speech, language and cognition are generally intact. Judgment and insight normal.   CRANIAL NERVES: Pupils are equal, round and reactive to light and accomodation; extra ocular movements are full, there is no significant nystagmus; visual fields are full; upper and lower facial muscles are normal in strength and symmetric, there is no flattening of the nasolabial folds; tongue is midline; uvula is midline; shoulder elevation is normal.  MOTOR: Normal tone, bulk and strength; no pronator drift.  There is a mild drift of the left upper extremity and the left leg.  COORDINATION: Left finger to nose is normal, right finger to nose is normal, No rest tremor; no intention tremor; no postural tremor; no bradykinesia.  REFLEXES: Deep tendon reflexes are symmetrical and normal.   SENSATION: Normal to light touch, temperature, and pain.  There is no extinction on double simultaneous stimulation.  NIH stroke scale 1, 1 total 2.     PRIOR NOTE 11-2018 1.  Recurrent episodes of right-sided weakness/numbness associated with headaches: The differential diagnosis includes recrudescence of previous infarct (she may have low-grade UTI on urinalysis but culture is pending), TIA, complex migraine/migraine with aura, seizures and conversion disorder.  I think the most likely etiology is migraine with aura/complex migraine.  Consequently, I think we should try  the patient on migraine prophylaxis.  I think we can retry Topamax but push the dose higher to at least 100 mg.  We will start at 25 mg and gradually increase the dose.  She is to continue with aspirin antiplatelet agents although I think that there should be at 325.  2.  Remote left thalamic infarct: I guess continue with aspirin but increase to 325.  Agree  with the initiation of a statin medication given the elevated LDL.  3.  Diabetic polyneuropathy:    The patient is a 68 year old right-handed white female who presents with right-sided weakness.  The entire right side was involved and associated with numbness and tingling.  It is apparent that she has had multiple episodes of right-sided weakness sometime associated with headaches.  She is seen previous neurologist.  She last saw Dr. Tomi Likens in his office in [place].  She has been worked up extensively and has had multiple imaging.  I have counted 6 brain imaging mostly MRI/MRA.  The results of all been unchanged.  Current scan is also unchanged from the previous scans.  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  EEG has been done 6 months ago and was unrevealing.  She reported the current episode lasted for about 20 minutes.  It was not associated with dysarthria, dysphagia or alteration of consciousness.  Previous events have lasted 4 days however.  The patient reports that she did have a headache with this event.  The headache was in the posterior occipital region.  She does have a history of episodic headaches and in fact has a diagnosis of migraine headaches.  She tells me she gets severe migraines about once every 2 months.  However, detailed questioning revealed that she does have more frequent mildly headaches.  She tells me she has had about 15 headache days in the last 30 days.  She has been on Topamax in the past but only low dose.  She tells me that it does not work but has not tried it since that time.  Her outpatient neurologist wanted her to try a Inderal but he left this up to her PCP.  She reports that she is back at baseline at this time.  The review of systems otherwise negative.           Blood pressure (!) 155/70, pulse 66, temperature 98.4 F (36.9 C), temperature source Oral, resp. rate 17, height 5' (1.524  m), weight 82 kg, SpO2 93 %.  Past Medical History:  Diagnosis Date  . Anxiety   . Arthritis    left hand  . Asthma    daily and prn inhalers  . Chronic cough   . Chronic otitis media 12/2017  . Full dentures   . GERD (gastroesophageal reflux disease)   . History of stroke 09/2017   weakness right hand, numbness right side face  . Hyperlipidemia   . Hypertension    states under control with meds., has been on med. x a long time, per pt.  . Non-insulin dependent type 2 diabetes mellitus ([place])   . Overactive bladder   . Recurrent acute suppurative otitis media without spontaneous rupture of left tympanic membrane 02/19/2018  . Transient cerebral ischemia     Past Surgical History:  Procedure Laterality Date  . ABDOMINAL HYSTERECTOMY     complete  . CATARACT EXTRACTION W/ INTRAOCULAR LENS IMPLANT Right   . CHOLECYSTECTOMY    .  COLONOSCOPY N/A 08/17/2015   Procedure: COLONOSCOPY;  Surgeon: Rogene Houston, MD;  [facility];  Service: Endoscopy;  Laterality: N/A;  200 - moved to 7:30 - Ann notified pt  . GAS INSERTION Right    x 2 - eye  . LACRIMAL DUCT EXPLORATION Bilateral    removal of tear ducts  . MYRINGOTOMY WITH TUBE PLACEMENT Bilateral 01/28/2018   Procedure: BILATERAL MYRINGOTOMY WITH TUBE PLACEMENT;  Surgeon: Leta Baptist, MD;  Location: [facility];  Service: ENT;  Laterality: Bilateral;  . VITRECTOMY Left 09/14/2015    Family History  Problem Relation Age of Onset  . Arthritis Mother   . Diabetes Mother   . CVA Mother   . Stroke Mother   . Diabetes Father   . Heart disease Father        CABG.  Does not know age of onset  . Asthma Father   . Hepatitis C Sister   . Arthritis Brother   . Cancer Brother        metastic cancer  . Peripheral Artery Disease Daughter   . Arthritis-Osteo Son     Social History:  reports that she has never smoked. She has never used smokeless tobacco. She reports that she does not drink alcohol or use drugs.   Allergies:  Allergies  Allergen Reactions  . Penicillins Hives    Has patient had a PCN reaction causing immediate rash, facial/tongue/throat swelling, SOB or lightheadedness with hypotension: Yes Has patient had a PCN reaction causing severe rash involving mucus membranes or skin necrosis: Unk Has patient had a PCN reaction that required hospitalization: No Has patient had a PCN reaction occurring within the last 10 years: No If all of the above answers are "NO", then may proceed with Cephalosporin use.   . Gabapentin Other (See Comments)    Causes a lot of lethargy at a higher dose  . Lisinopril Cough  . Soap Rash    DIAL soap    Medications: Prior to Admission medications   Medication Sig Start Date End Date Taking? Authorizing Provider  albuterol (VENTOLIN HFA) 108 (90 Base) MCG/ACT inhaler Inhale 2 puffs into the lungs every 6 (six) hours as needed for wheezing or shortness of breath. 10/22/18  Yes [name], [name], MD  ALPRAZolam Duanne Moron) 1 MG tablet Take 1 tablet (1 mg total) by mouth 3 (three) times daily. 06/08/19  Yes [name], [name], MD  amLODipine (NORVASC) 10 MG tablet TAKE 1 TABLET (5 MG TOTAL) BY MOUTH DAILY. Patient taking differently: Take 10 mg by mouth every morning.  10/22/18  Yes [name], MD  aspirin EC 325 MG tablet Take 1 tablet (325 mg total) by mouth daily. 12/12/18 12/12/19 Yes Kathie Dike, MD  atorvastatin (LIPITOR) 40 MG tablet TAKE 1 TABLET EVERY DAY AT 6PM Patient taking differently: Take 40 mg by mouth every evening.  10/22/18  Yes [name], [name], MD  ezetimibe (ZETIA) 10 MG tablet Take 1 tablet (10 mg total) by mouth daily. For cholesterol Patient taking differently: Take 10 mg by mouth every evening. For cholesterol 10/22/18  Yes [name], [name], MD  fluticasone furoate-vilanterol (BREO ELLIPTA) 100-25 MCG/INH AEPB Inhale 1 puff into the lungs daily. Patient taking differently: Inhale 1 puff into the lungs daily as needed (for shortness of breath).   10/22/18  Yes [name], [name], MD  furosemide (LASIX) 20 MG tablet Take 1 tablet (20 mg total) by mouth daily. Patient taking differently: Take 20 mg by mouth daily as needed for  fluid.  10/22/18  Yes [name], [name], MD  loratadine (CLARITIN) 10 MG tablet Take 1 tablet (10 mg total) by mouth daily. 02/18/19  Yes [name], MD  ondansetron (ZOFRAN-ODT) 8 MG disintegrating tablet Take 1 tablet (8 mg total) by mouth every 6 (six) hours as needed for nausea or vomiting. 06/04/19  Yes [name], [name], MD  topiramate (TOPAMAX) 200 MG tablet TAKE 1 TABLET (200 MG TOTAL) BY MOUTH AT BEDTIME. 05/19/19  Yes [name], [name], MD  BELBUCA 150 MCG FILM Place 150 mcg under the tongue 2 (two) times daily.  11/22/18   [provider]  levofloxacin (LEVAQUIN) 500 MG tablet Take 1 tablet (500 mg total) by mouth daily. Patient not taking: Reported on 06/10/2019 06/04/19   [name], MD  SitaGLIPtin-MetFORMIN HCl 50-1000 MG TB24 Take 1 tablet by mouth 2 (two) times daily. For diabetes 06/09/19   [name], MD    Scheduled Meds: . ALPRAZolam  1 mg Oral TID  . aspirin EC  325 mg Oral Daily  . atorvastatin  40 mg Oral QPM  . enoxaparin (LOVENOX) injection  40 mg Subcutaneous Q24H  . ezetimibe  10 mg Oral QPM  . insulin aspart  0-9 Units Subcutaneous TID WC  . loratadine  10 mg Oral Daily   Continuous Infusions: . sodium chloride 100 mL/hr at 06/11/19 0546   PRN Meds:.acetaminophen **OR** acetaminophen (TYLENOL) oral liquid 160 mg/5 mL **OR** acetaminophen, albuterol, fluticasone furoate-vilanterol, furosemide, labetalol, senna-docusate     Results for orders placed or performed during the hospital encounter of 06/10/19 (from the past 48 hour(s))  CBC with Differential/Platelet     Status: None   Collection Time: 06/10/19  1:37 PM  Result Value Ref Range   WBC 6.3 4.0 - 10.5 K/uL   RBC 4.53 3.87 - 5.11 MIL/uL   Hemoglobin 12.9 12.0 - 15.0 g/dL   HCT 39.2 36.0 - 46.0 %   MCV 86.5 80.0 - 100.0  fL   MCH 28.5 26.0 - 34.0 pg   MCHC 32.9 30.0 - 36.0 g/dL   RDW 12.2 11.5 - 15.5 %   Platelets 262 150 - 400 K/uL   nRBC 0.0 0.0 - 0.2 %   Neutrophils Relative % 47 %   Neutro Abs 3.0 1.7 - 7.7 K/uL   Lymphocytes Relative 45 %   Lymphs Abs 2.8 0.7 - 4.0 K/uL   Monocytes Relative 7 %   Monocytes Absolute 0.4 0.1 - 1.0 K/uL   Eosinophils Relative 1 %   Eosinophils Absolute 0.1 0.0 - 0.5 K/uL   Basophils Relative 0 %   Basophils Absolute 0.0 0.0 - 0.1 K/uL   WBC Morphology PLASMA CELLS     Comment: FEW SMUDGE CELLS   Immature Granulocytes 0 %   Abs Immature Granulocytes 0.02 0.00 - 0.07 K/uL   Abnormal Lymphocytes Present PRESENT    Reactive, Benign Lymphocytes PRESENT     Comment: Performed at [facility], [address], [place], [place] [address]  Comprehensive metabolic panel     Status: Abnormal   Collection Time: 06/10/19  1:37 PM  Result Value Ref Range   Sodium 141 135 - 145 mmol/L   Potassium 3.8 3.5 - 5.1 mmol/L   Chloride 103 98 - 111 mmol/L   CO2 30 22 - 32 mmol/L   Glucose, Bld 148 (H) 70 - 99 mg/dL   BUN 15 8 - 23 mg/dL   Creatinine, Ser 0.73 0.44 - 1.00 mg/dL   Calcium 9.1 8.9 - 10.3  mg/dL   Total Protein 6.8 6.5 - 8.1 g/dL   Albumin 3.4 (L) 3.5 - 5.0 g/dL   AST 16 15 - 41 U/L   ALT 17 0 - 44 U/L   Alkaline Phosphatase 85 38 - 126 U/L   Total Bilirubin 0.4 0.3 - 1.2 mg/dL   GFR calc non Af Amer >60 >60 mL/min   GFR calc Af Amer >60 >60 mL/min   Anion gap 8 5 - 15    Comment: Performed at [facility], [address] [place], [place] [address]  Pathologist smear review     Status: None   Collection Time: 06/10/19  1:37 PM  Result Value Ref Range   Path Review Reviewed by Kalman Drape, M.D.     Comment: 7.16.2020 CIRCULATING PLASMA CELLS/PLASMACYTOID LYMPHOCYTES. HEMATOLOGY WORKUP RECOMMENDED.  Performed at [facility], [place] [address] [place], [place] [address]   SARS Coronavirus 2 (CEPHEID - Performed in [place] hospital lab),  Hosp Order     Status: None   Collection Time: 06/10/19  1:54 PM   Specimen: Nasopharyngeal Swab  Result Value Ref Range   SARS Coronavirus 2 NEGATIVE NEGATIVE    Comment: (NOTE) If result is NEGATIVE SARS-CoV-2 target nucleic acids are NOT DETECTED. The SARS-CoV-2 RNA is generally detectable in upper and lower  respiratory specimens during the acute phase of infection. The lowest  concentration of SARS-CoV-2 viral copies this assay can detect is 250  copies / mL. A negative result does not preclude SARS-CoV-2 infection  and should not be used as the sole basis for treatment or other  patient management decisions.  A negative result may occur with  improper specimen collection / handling, submission of specimen other  than nasopharyngeal swab, presence of viral mutation(s) within the  areas targeted by this assay, and inadequate number of viral copies  (<250 copies / mL). A negative result must be combined with clinical  observations, patient history, and epidemiological information. If result is POSITIVE SARS-CoV-2 target nucleic acids are DETECTED. The SARS-CoV-2 RNA is generally detectable in upper and lower  respiratory specimens dur ing the acute phase of infection.  Positive  results are indicative of active infection with SARS-CoV-2.  Clinical  correlation with patient history and other diagnostic information is  necessary to determine patient infection status.  Positive results do  not rule out bacterial infection or co-infection with other viruses. If result is PRESUMPTIVE POSTIVE SARS-CoV-2 nucleic acids MAY BE PRESENT.   A presumptive positive result was obtained on the submitted specimen  and confirmed on repeat testing.  While 2019 novel coronavirus  (SARS-CoV-2) nucleic acids may be present in the submitted sample  additional confirmatory testing may be necessary for epidemiological  and / or clinical management purposes  to differentiate between  SARS-CoV-2 and other  Sarbecovirus currently known to infect humans.  If clinically indicated additional testing with an alternate test  methodology [phone]) is advised. The SARS-CoV-2 RNA is generally  detectable in upper and lower respiratory sp ecimens during the acute  phase of infection. The expected result is Negative. Fact Sheet for Patients:  StrictlyIdeas.no Fact Sheet for Healthcare Providers: BankingDealers.co.za This test is not yet approved or cleared by the [place] FDA and has been authorized for detection and/or diagnosis of SARS-CoV-2 by FDA under an Emergency Use Authorization (EUA).  This EUA will remain in effect (meaning this test can be used) for the duration of the COVID-19 declaration under Section 564(b)(1) of the  Act, 21 U.S.C. section 360bbb-3(b)(1), unless the authorization is terminated or revoked sooner. Performed at [facility], [address] [place], [place] [address]   Glucose, capillary     Status: Abnormal   Collection Time: 06/10/19  9:35 PM  Result Value Ref Range   Glucose-Capillary 156 (H) 70 - 99 mg/dL  Lipid panel     Status: Abnormal   Collection Time: 06/11/19  6:22 AM  Result Value Ref Range   Cholesterol 165 0 - 200 mg/dL   Triglycerides 196 (H) <150 mg/dL   HDL 29 (L) >40 mg/dL   Total CHOL/HDL Ratio 5.7 RATIO   VLDL 39 0 - 40 mg/dL   LDL Cholesterol 97 0 - 99 mg/dL    Comment:        Total Cholesterol/HDL:CHD Risk Coronary Heart Disease Risk Table                     Men   Women  1/2 Average Risk   3.4   3.3  Average Risk       5.0   4.4  2 X Average Risk   9.6   7.1  3 X Average Risk  23.4   11.0        Use the calculated Patient Ratio above and the CHD Risk Table to determine the patient's CHD Risk.        ATP III CLASSIFICATION (LDL):  <100     mg/dL   Optimal  100-129  mg/dL   Near or Above                    Optimal  130-159  mg/dL   Borderline  160-189  mg/dL   High  >190     mg/dL    Very High Performed at [place] [place], [place] [address]   Glucose, capillary     Status: Abnormal   Collection Time: 06/11/19  7:37 AM  Result Value Ref Range   Glucose-Capillary 162 (H) 70 - 99 mg/dL  Glucose, capillary     Status: Abnormal   Collection Time: 06/11/19 11:22 AM  Result Value Ref Range   Glucose-Capillary 218 (H) 70 - 99 mg/dL    Studies/Results:  CAROTID IMPRESSION: 1. Bilateral carotid bifurcation plaque resulting in less than 50% diameter ICA stenosis. 2. Antegrade bilateral vertebral arterial flow.  BRAIN MRA FINDINGS: Both internal carotid arteries are widely patent through the skull base and siphon regions. The anterior and middle cerebral vessels are patent. There is a 50% stenosis of the main M2 branch of the right middle cerebral artery.  Both vertebral arteries are patent to the basilar. No basilar stenosis. There is severe stenosis of both posterior cerebral arteries at the P1 P2 junction regions.  IMPRESSION: Worsening intracranial atherosclerotic disease with worsening stenoses in both posterior cerebral arteries and in the right M2 level of the right MCA.    FINDINGS: Brain: Remote lacunar infarcts of the left thalamus are again noted. A lacunar infarct of the posterior left corona radiata is new since the prior study, but not acute. The diffusion-weighted images demonstrate no acute or subacute infarction. The ventricles are of normal size. No significant white matter disease is present otherwise a remote lacunar infarct is present in the inferior right cerebellum.  Vascular: Flow is present in the major intracranial arteries.  Skull and upper cervical spine: The craniocervical junction is normal. Upper cervical spine is within normal limits. Marrow signal  is unremarkable.  Sinuses/Orbits: Chronic left maxillary sinus disease is present with a shrunken sinus. The paranasal sinuses mastoid air cells are  clear. A right lens replacement is present. Globes and orbits are otherwise within normal limits.  IMPRESSION: 1. No acute intracranial abnormality to explain left-sided weakness. 2. Stable remote lacunar infarcts of the left thalamus and inferior right cerebellum. 3. Posterior left corona radiata lacunar infarct is new since January, but not acute.      The brain MRI and MRA are reviewed in person.  No acute infarcts are seen.  There is suggestion of a small microhemorrhage involving the left temporoparietal region.  Remote infarcts are seen again involving the left thalamic region, left corona radiata/centrum semiovale abutting the posterior horn of the lateral ventricle on the left side.  There is in general minimal chronic white matter ischemic changes.  MRA shows moderate disease involving the right M1 segment of the MCA and the PCAs bilaterally.      Andrw Mcguirt A. Merlene [name], M.D.  Diplomate, Tax adviser of Psychiatry and Neurology ( Neurology). 06/11/2019, 3:47 PM

## 2019-06-11 NOTE — Progress Notes (Signed)
SLP Cancellation Note  Patient Details Name: Yvonne [name] MRN: [id number] DOB: 02-17-1951   Cancelled treatment:       Reason Eval/Treat Not Completed: SLP screened, no needs identified, will sign off; SLP screened Pt in room. Pt denies any changes in swallowing, speech, language, or cognition. MRI negative for acute changes. SLE will be deferred at this time. Reconsult if indicated. SLP will sign off.  Thank you,  [name], [place]    [place] 06/11/2019, 11:33 AM

## 2019-06-11 NOTE — Progress Notes (Signed)
Echocardiogram 2D Echocardiogram has been performed.  [place] Deputy Danessa Mensch 06/11/2019, 2:06 PM

## 2019-06-11 NOTE — Progress Notes (Signed)
PROGRESS NOTE  Yvonne [name] YIF:[id number] DOB: June 22, 1951 DOA: 06/10/2019 PCP: [name], MD  Brief History:  68 year old female with a history of stroke, anxiety, hypertension, hyperlipidemia, diabetes mellitus, migraine headaches presenting with left arm and left leg numbness and tingling as well as weakness that began on the morning of 06/09/2019.  The patient stated that it lasted "a couple minutes".  However she continued to have some intermittent episodes lasting a few minutes on 06/10/2019.  As result, the patient presented for further evaluation.  The patient endorsed having a headache during these episodes.  She denied any loss of vision, dysarthria, fevers, chills, dizziness. The patient was previously admitted to the hospital in January 2020 for right-sided hemiparesis that has been recurrent.  Although it was felt that she likely had a complicated migraine other considerations at that time included seizure and conversion disorder.  Her aspirin was increased to 325 mg daily.  The patient was instructed to increase her Topamax.  Furthermore, the patient has had a previous neurologic work-up with her outpatient neurologist, Dr. Metta Clines.  She was seen in the office on 06/12/2017 for recurrent right-sided hemiparesis.  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  Upon further questioning, it appears that the patient has not been completely compliant with all her medications.  She states that she misses taking her aspirin 2 to 3 days/week.  In addition, the patient has been taking 81 mg rather than the instructed 325 mg.  In addition, the patient has been off her metformin for the past month.  She has not been taking her Topamax.  She stated that it is not working for her.  In the emergency department, the patient was afebrile hemodynamically stable saturating 97 % on room air.  CT of the brain showed a chronic left thalamic infarct.  The patient was admitted for further  stroke work-up.  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 Remote lacunar infarcts L-thalamus and R-cerebellum -MRA brain--no LVO; worsening ASVD -Carotid Duplex-- -Echo-- -LDL--97 -HbA1C-- -Antiplatelet--ASA 325 mg daily  Essential hypertension -Holding amlodipine to allow for permissive hypertension  Hyperlipidemia -Continue Lipitor and Zetia -Check lipid panel--LDL 97 -continue lipitor 40 mg daily as pt not completely compliant with med  Diabetes mellitus type 2 -Check hemoglobin A1c -NovoLog sliding scale -Holding metformin  Anxiety -Continue home dose of problem  -PMP AWARE queried for patient--getting alprazolam from one provider, #90 each month         Disposition Plan:   Home 7/17 when cleared by neurology Family Communication:   Family at bedside  Consultants:  neurology  Code Status:  FULL   DVT Prophylaxis:  Alto Lovenox   Procedures: As Listed in Progress Note Above  Antibiotics: None      Subjective: Patient denies fevers, chills, headache, chest pain, dyspnea, nausea, vomiting, diarrhea, abdominal pain, dysuria, hematuria, hematochezia, and melena.   Objective: Vitals:   06/10/19 1208 06/11/19 0140 06/11/19 0340 06/11/19 0540  BP:  (!) 160/77 (!) 147/76 (!) 146/66  Pulse:  64 69 66  Resp:  16 16 16   Temp:  98.5 F (36.9 C) 98.7 F (37.1 C) 98.6 F (37 C)  TempSrc:  Oral Oral Oral  SpO2:  95% 97% 96%  Weight: 82 kg  Height: 5' (1.524 m)       Intake/Output Summary (Last 24 hours) at 06/11/2019 0729 Last data filed at 06/11/2019 0400 Gross per 24 hour  Intake 1336.91 ml  Output 400 ml  Net 936.91 ml   Weight change:  Exam:   General:  Pt is alert, follows commands appropriately, not in acute  distress  HEENT: No icterus, No thrush, No neck mass, [place]/AT  Cardiovascular: RRR, S1/S2, no rubs, no gallops  Respiratory: CTA bilaterally, no wheezing, no crackles, no rhonchi  Abdomen: Soft/+BS, non tender, non distended, no guarding  Extremities: No edema, No lymphangitis, No petechiae, No rashes, no synovitis  Neuro:  CN II-XII intact, strength 4/5 in RUE, RLE, strength 4/5 LUE, LLE; sensation intact bilateral; no dysmetria; babinski equivocal     Data Reviewed: I have personally reviewed following labs and imaging studies Basic Metabolic Panel: Recent Labs  Lab 06/10/19 1337  NA 141  K 3.8  CL 103  CO2 30  GLUCOSE 148*  BUN 15  CREATININE 0.73  CALCIUM 9.1   Liver Function Tests: Recent Labs  Lab 06/10/19 1337  AST 16  ALT 17  ALKPHOS 85  BILITOT 0.4  PROT 6.8  ALBUMIN 3.4*   No results for input(s): LIPASE, AMYLASE in the last 168 hours. No results for input(s): AMMONIA in the last 168 hours. Coagulation Profile: No results for input(s): INR, PROTIME in the last 168 hours. CBC: Recent Labs  Lab 06/10/19 1337  WBC 6.3  NEUTROABS 3.0  HGB 12.9  HCT 39.2  MCV 86.5  PLT 262   Cardiac Enzymes: No results for input(s): CKTOTAL, CKMB, CKMBINDEX, TROPONINI in the last 168 hours. BNP: Invalid input(s): POCBNP CBG: Recent Labs  Lab 06/10/19 2135  GLUCAP 156*   HbA1C: No results for input(s): HGBA1C in the last 72 hours. Urine analysis:    Component Value Date/Time   COLORURINE YELLOW 12/11/2018 2105   APPEARANCEUR CLEAR 12/11/2018 2105   APPEARANCEUR Cloudy (A) 10/22/2018 0913   LABSPEC 1.014 12/11/2018 2105   PHURINE 6.0 12/11/2018 2105   GLUCOSEU 50 (A) 12/11/2018 2105   HGBUR SMALL (A) 12/11/2018 2105   BILIRUBINUR NEGATIVE 12/11/2018 2105   BILIRUBINUR Negative 10/22/2018 0913   KETONESUR NEGATIVE 12/11/2018 2105   PROTEINUR NEGATIVE 12/11/2018 2105   NITRITE NEGATIVE 12/11/2018 2105   LEUKOCYTESUR TRACE (A) 12/11/2018 2105    LEUKOCYTESUR 1+ (A) 10/22/2018 0913   Sepsis Labs: @LABRCNTIP (procalcitonin:4,lacticidven:4) ) Recent Results (from the past 240 hour(s))  SARS Coronavirus 2 (CEPHEID - Performed in [facility] hospital lab), Hosp Order     Status: None   Collection Time: 06/10/19  1:54 PM   Specimen: Nasopharyngeal Swab  Result Value Ref Range Status   SARS Coronavirus 2 NEGATIVE NEGATIVE Final    Comment: (NOTE) If result is NEGATIVE SARS-CoV-2 target nucleic acids are NOT DETECTED. The SARS-CoV-2 RNA is generally detectable in upper and lower  respiratory specimens during the acute phase of infection. The lowest  concentration of SARS-CoV-2 viral copies this assay can detect is 250  copies / mL. A negative result does not preclude SARS-CoV-2 infection  and should not be used as the sole basis for treatment or other  patient management decisions.  A negative result may occur with  improper specimen collection / handling, submission of specimen other  than nasopharyngeal swab, presence of viral mutation(s) within the  areas targeted by this assay, and inadequate number of viral copies  (<250 copies / mL). A negative result must be  combined with clinical  observations, patient history, and epidemiological information. If result is POSITIVE SARS-CoV-2 target nucleic acids are DETECTED. The SARS-CoV-2 RNA is generally detectable in upper and lower  respiratory specimens dur ing the acute phase of infection.  Positive  results are indicative of active infection with SARS-CoV-2.  Clinical  correlation with patient history and other diagnostic information is  necessary to determine patient infection status.  Positive results do  not rule out bacterial infection or co-infection with other viruses. If result is PRESUMPTIVE POSTIVE SARS-CoV-2 nucleic acids MAY BE PRESENT.   A presumptive positive result was obtained on the submitted specimen  and confirmed on repeat testing.  While 2019 novel coronavirus   (SARS-CoV-2) nucleic acids may be present in the submitted sample  additional confirmatory testing may be necessary for epidemiological  and / or clinical management purposes  to differentiate between  SARS-CoV-2 and other Sarbecovirus currently known to infect humans.  If clinically indicated additional testing with an alternate test  methodology [phone]) is advised. The SARS-CoV-2 RNA is generally  detectable in upper and lower respiratory sp ecimens during the acute  phase of infection. The expected result is Negative. Fact Sheet for Patients:  StrictlyIdeas.no Fact Sheet for Healthcare Providers: BankingDealers.co.za This test is not yet approved or cleared by the [place] FDA and has been authorized for detection and/or diagnosis of SARS-CoV-2 by FDA under an Emergency Use Authorization (EUA).  This EUA will remain in effect (meaning this test can be used) for the duration of the COVID-19 declaration under Section 564(b)(1) of the Act, 21 U.S.C. section 360bbb-3(b)(1), unless the authorization is terminated or revoked sooner. Performed at [facility], [address], [place], [place] [address]      Scheduled Meds:  ALPRAZolam  1 mg Oral TID   aspirin EC  325 mg Oral Daily   atorvastatin  40 mg Oral QPM   enoxaparin (LOVENOX) injection  40 mg Subcutaneous Q24H   ezetimibe  10 mg Oral QPM   insulin aspart  0-15 Units Subcutaneous TID WC   insulin aspart  0-5 Units Subcutaneous QHS   loratadine  10 mg Oral Daily   Continuous Infusions:  sodium chloride 100 mL/hr at 06/11/19 0546    Procedures/Studies: Ct Head Wo Contrast  Result Date: 06/10/2019 CLINICAL DATA:  Left-sided weakness. EXAM: CT HEAD WITHOUT CONTRAST TECHNIQUE: Contiguous axial images were obtained from the base of the skull through the vertex without intravenous contrast. COMPARISON:  Head CT 12/11/2018 and MRI 12/12/2018 FINDINGS: Brain: There is  no evidence of acute infarct, intracranial hemorrhage, mass, midline shift, or extra-axial fluid collection. A chronic lacunar infarct is again noted in the left thalamus. The ventricles and sulci are normal. Vascular: Calcified atherosclerosis at the skull base. No hyperdense vessel. Skull: No fracture or focal osseous lesion. Sinuses/Orbits: Chronically small left maxillary sinus. Small volume bubbly fluid in the left sphenoid sinus. Clear mastoid air cells. Right cataract extraction. Other: None. IMPRESSION: 1. No evidence of acute intracranial abnormality. 2. Chronic left thalamic lacunar infarct. Electronically Signed   By: Logan Bores M.D.   On: 06/10/2019 14:56   Mr Angio Head Wo Contrast  Result Date: 06/10/2019 CLINICAL DATA:  Altered level of consciousness. Left-sided weakness beginning yesterday. EXAM: MRA HEAD WITHOUT CONTRAST TECHNIQUE: Angiographic images of the Circle of Willis were obtained using MRA technique without intravenous contrast. COMPARISON:  MRI same day.  Head CT same day.  MRA 06/06/2018 FINDINGS: Both internal carotid arteries are widely patent through  the skull base and siphon regions. The anterior and middle cerebral vessels are patent. There is a 50% stenosis of the main M2 branch of the right middle cerebral artery. Both vertebral arteries are patent to the basilar. No basilar stenosis. There is severe stenosis of both posterior cerebral arteries at the P1 P2 junction regions. IMPRESSION: Worsening intracranial atherosclerotic disease with worsening stenoses in both posterior cerebral arteries and in the right M2 level of the right MCA. Electronically Signed   By: Nelson Chimes M.D.   On: 06/10/2019 15:21   Mr Brain Wo Contrast  Result Date: 06/10/2019 CLINICAL DATA:  Altered level of consciousness. Unexplained. Episode of left-sided weakness yesterday morning which occurred again today. EXAM: MRI HEAD WITHOUT CONTRAST TECHNIQUE: Multiplanar, multiecho pulse sequences of the  brain and surrounding structures were obtained without intravenous contrast. COMPARISON:  MRI brain 12/12/2018. FINDINGS: Brain: Remote lacunar infarcts of the left thalamus are again noted. A lacunar infarct of the posterior left corona radiata is new since the prior study, but not acute. The diffusion-weighted images demonstrate no acute or subacute infarction. The ventricles are of normal size. No significant white matter disease is present otherwise a remote lacunar infarct is present in the inferior right cerebellum. Vascular: Flow is present in the major intracranial arteries. Skull and upper cervical spine: The craniocervical junction is normal. Upper cervical spine is within normal limits. Marrow signal is unremarkable. Sinuses/Orbits: Chronic left maxillary sinus disease is present with a shrunken sinus. The paranasal sinuses mastoid air cells are clear. A right lens replacement is present. Globes and orbits are otherwise within normal limits. IMPRESSION: 1. No acute intracranial abnormality to explain left-sided weakness. 2. Stable remote lacunar infarcts of the left thalamus and inferior right cerebellum. 3. Posterior left corona radiata lacunar infarct is new since January, but not acute. Electronically Signed   By: San Morelle M.D.   On: 06/10/2019 15:22    Orson Eva, DO  [facility] Pager [phone]  If 7PM-7AM, please contact night-coverage [email or web address] Password [email or web address] 06/11/2019, 7:29 AM   LOS: 0 days

## 2019-06-12 DIAGNOSIS — G43709 Chronic migraine without aura, not intractable, without status migrainosus: Secondary | ICD-10-CM | POA: Diagnosis not present

## 2019-06-12 DIAGNOSIS — E785 Hyperlipidemia, unspecified: Secondary | ICD-10-CM | POA: Diagnosis not present

## 2019-06-12 DIAGNOSIS — I10 Essential (primary) hypertension: Secondary | ICD-10-CM | POA: Diagnosis not present

## 2019-06-12 DIAGNOSIS — R531 Weakness: Secondary | ICD-10-CM | POA: Diagnosis not present

## 2019-06-12 DIAGNOSIS — G459 Transient cerebral ischemic attack, unspecified: Secondary | ICD-10-CM | POA: Diagnosis not present

## 2019-06-12 DIAGNOSIS — F411 Generalized anxiety disorder: Secondary | ICD-10-CM | POA: Diagnosis not present

## 2019-06-12 LAB — GLUCOSE, CAPILLARY
Glucose-Capillary: 142 mg/dL — ABNORMAL HIGH (ref 70–99)
Glucose-Capillary: 197 mg/dL — ABNORMAL HIGH (ref 70–99)

## 2019-06-12 MED ORDER — CLOPIDOGREL BISULFATE 75 MG PO TABS: 75.0000 mg | ORAL_TABLET | Freq: Every day | ORAL | 0 refills | Status: DC

## 2019-06-12 MED ORDER — GLIPIZIDE 5 MG PO TABS: 5.0000 mg | ORAL_TABLET | Freq: Every day | ORAL | 1 refills | Status: DC

## 2019-06-12 NOTE — Discharge Summary (Signed)
Physician Discharge Summary  Yvonne [name] YDX:[id number] DOB: 1951-06-26 DOA: 06/10/2019  PCP: [name], MD  Admit date: 06/10/2019 Discharge date: 06/12/2019  Admitted From: Home Disposition:  Home  Recommendations for Outpatient Follow-up:  1. Follow up with PCP in 1-2 weeks 2. Please obtain BMP/CBC in one week     Discharge Condition: Stable CODE STATUS: FULL Diet recommendation: Heart Healthy / Carb Modified   Brief/Interim Summary: 68 year old female with a history of stroke, anxiety, hypertension, hyperlipidemia, diabetes mellitus, migraine headaches presenting with left arm and left leg numbness and tingling as well as weakness that began on the morning of 06/09/2019.  The patient stated that it lasted "a couple minutes".  However she continued to have some intermittent episodes lasting a few minutes on 06/10/2019.  As result, the patient presented for further evaluation.  The patient endorsed having a headache during these episodes.  She denied any loss of vision, dysarthria, fevers, chills, dizziness. The patient was previously admitted to the hospital in January 2020 for right-sided hemiparesis that has been recurrent.  Although it was felt that she likely had a complicated migraine other considerations at that time included seizure and conversion disorder.  Her aspirin was increased to 325 mg daily.  The patient was instructed to increase her Topamax.  Furthermore, the patient has had a previous neurologic work-up with her outpatient neurologist, Dr. Metta Clines.  She was seen in the office on 06/12/2017 for recurrent right-sided hemiparesis.  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  Upon further questioning, it appears that the patient has not been completely compliant with all her medications.  She states that she misses taking her aspirin 2 to 3 days/week.  In addition, the patient has been taking 81 mg rather than the instructed 325 mg.  In addition, the  patient has been off her metformin for the past month.  She has not been taking her Topamax.  She stated that it is not working for her.  In the emergency department, the patient was afebrile hemodynamically stable saturating 97 % on room air.  CT of the brain showed a chronic left thalamic infarct.  The patient was admitted for further stroke work-up.  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 Remote lacunar infarcts L-thalamus and R-cerebellum -MRA brain--no LVO; worsening ASVD -Carotid Duplex--no hemodynamically significant stenosis -Echo--EF 60-65%, no PFO, no thrombi -LDL--97 -HbA1C--8.6 -Antiplatelet--ASA 325 mg daily + plavix x 7 days, then ASA 325 mg daily -f/u with outpt neurology, Dr. Tomi Likens; referral made  Essential hypertension -Holding amlodipine to allow for permissive hypertension--restart after d/c  Hyperlipidemia -Continue Lipitor and Zetia -Check lipid panel--LDL 97 -continue lipitor 40 mg daily as pt not completely compliant with med  Diabetes mellitus type 2, uncontrolled with hyperglycemia -Check hemoglobin A1c--8.6 -NovoLog sliding scale -Holding metformin/sitagliptin--restart after d/c -add glipizide  Anxiety -Continue home dose of problem  -PMP AWARE queried for patient--getting alprazolam from one provider, #90 each month       Discharge Instructions  Discharge Instructions    Ambulatory referral to Neurology   Complete by: As directed    An appointment is requested in approximately: 2-4 weeks TIA vs complicated migraine vs conversion disorder     Allergies as of 06/12/2019      Reactions  Penicillins Hives   Has patient had a PCN reaction  causing immediate rash, facial/tongue/throat swelling, SOB or lightheadedness with hypotension: Yes Has patient had a PCN reaction causing severe rash involving mucus membranes or skin necrosis: Unk Has patient had a PCN reaction that required hospitalization: No Has patient had a PCN reaction occurring within the last 10 years: No If all of the above answers are "NO", then may proceed with Cephalosporin use.   Gabapentin Other (See Comments)   Causes a lot of lethargy at a higher dose   Lisinopril Cough   Soap Rash   DIAL soap      Medication List    STOP taking these medications   Belbuca 150 MCG Film Generic drug: Buprenorphine HCl   levofloxacin 500 MG tablet Commonly known as: LEVAQUIN     TAKE these medications   albuterol 108 (90 Base) MCG/ACT inhaler Commonly known as: Ventolin HFA Inhale 2 puffs into the lungs every 6 (six) hours as needed for wheezing or shortness of breath.   ALPRAZolam 1 MG tablet Commonly known as: XANAX Take 1 tablet (1 mg total) by mouth 3 (three) times daily.   amLODipine 10 MG tablet Commonly known as: NORVASC TAKE 1 TABLET (5 MG TOTAL) BY MOUTH DAILY. What changed:   how much to take  how to take this  when to take this  additional instructions   aspirin EC 325 MG tablet Take 1 tablet (325 mg total) by mouth daily.   atorvastatin 40 MG tablet Commonly known as: LIPITOR TAKE 1 TABLET EVERY DAY AT 6PM What changed:   how much to take  how to take this  when to take this  additional instructions   clopidogrel 75 MG tablet Commonly known as: PLAVIX Take 1 tablet (75 mg total) by mouth daily with breakfast. Start taking on: June 13, 2019   ezetimibe 10 MG tablet Commonly known as: Zetia Take 1 tablet (10 mg total) by mouth daily. For cholesterol What changed: when to take this   fluticasone furoate-vilanterol 100-25 MCG/INH Aepb Commonly known as: Breo Ellipta Inhale 1 puff into the lungs daily. What changed:    when to take this  reasons to take this   furosemide 20 MG tablet Commonly known as: LASIX Take 1 tablet (20 mg total) by mouth daily. What changed:   when to take this  reasons to take this   glipiZIDE 5 MG tablet Commonly known as: GLUCOTROL Take 1 tablet (5 mg total) by mouth daily before breakfast.   loratadine 10 MG tablet Commonly known as: CLARITIN Take 1 tablet (10 mg total) by mouth daily.   ondansetron 8 MG disintegrating tablet Commonly known as: ZOFRAN-ODT Take 1 tablet (8 mg total) by mouth every 6 (six) hours as needed for nausea or vomiting.   SitaGLIPtin-MetFORMIN HCl 50-1000 MG Tb24 Take 1 tablet by mouth 2 (two) times daily. For diabetes   topiramate 200 MG tablet Commonly known as: TOPAMAX TAKE 1 TABLET (200 MG TOTAL) BY MOUTH AT BEDTIME.       Allergies  Allergen Reactions   Penicillins Hives    Has patient had a PCN reaction causing immediate rash, facial/tongue/throat swelling, SOB or lightheadedness with hypotension: Yes Has patient had a PCN reaction causing severe rash involving mucus membranes or skin necrosis: Unk Has patient had a PCN reaction that required hospitalization: No Has patient had a PCN reaction occurring within the last 10 years: No If all of the above answers are "NO", then  may proceed with Cephalosporin use.    Gabapentin Other (See Comments)    Causes a lot of lethargy at a higher dose   Lisinopril Cough   Soap Rash    DIAL soap    Consultations:  neurology   Procedures/Studies: Ct Head Wo Contrast  Result Date: 06/10/2019 CLINICAL DATA:  Left-sided weakness. EXAM: CT HEAD WITHOUT CONTRAST TECHNIQUE: Contiguous axial images were obtained from the base of the skull through the vertex without intravenous contrast. COMPARISON:  Head CT 12/11/2018 and MRI 12/12/2018 FINDINGS: Brain: There is no evidence of acute infarct, intracranial hemorrhage, mass, midline shift, or extra-axial fluid collection. A chronic  lacunar infarct is again noted in the left thalamus. The ventricles and sulci are normal. Vascular: Calcified atherosclerosis at the skull base. No hyperdense vessel. Skull: No fracture or focal osseous lesion. Sinuses/Orbits: Chronically small left maxillary sinus. Small volume bubbly fluid in the left sphenoid sinus. Clear mastoid air cells. Right cataract extraction. Other: None. IMPRESSION: 1. No evidence of acute intracranial abnormality. 2. Chronic left thalamic lacunar infarct. Electronically Signed   By: Logan Bores M.D.   On: 06/10/2019 14:56   Mr Angio Head Wo Contrast  Result Date: 06/10/2019 CLINICAL DATA:  Altered level of consciousness. Left-sided weakness beginning yesterday. EXAM: MRA HEAD WITHOUT CONTRAST TECHNIQUE: Angiographic images of the Circle of Willis were obtained using MRA technique without intravenous contrast. COMPARISON:  MRI same day.  Head CT same day.  MRA 06/06/2018 FINDINGS: Both internal carotid arteries are widely patent through the skull base and siphon regions. The anterior and middle cerebral vessels are patent. There is a 50% stenosis of the main M2 branch of the right middle cerebral artery. Both vertebral arteries are patent to the basilar. No basilar stenosis. There is severe stenosis of both posterior cerebral arteries at the P1 P2 junction regions. IMPRESSION: Worsening intracranial atherosclerotic disease with worsening stenoses in both posterior cerebral arteries and in the right M2 level of the right MCA. Electronically Signed   By: Nelson Chimes M.D.   On: 06/10/2019 15:21   Mr Brain Wo Contrast  Result Date: 06/10/2019 CLINICAL DATA:  Altered level of consciousness. Unexplained. Episode of left-sided weakness yesterday morning which occurred again today. EXAM: MRI HEAD WITHOUT CONTRAST TECHNIQUE: Multiplanar, multiecho pulse sequences of the brain and surrounding structures were obtained without intravenous contrast. COMPARISON:  MRI brain 12/12/2018.  FINDINGS: Brain: Remote lacunar infarcts of the left thalamus are again noted. A lacunar infarct of the posterior left corona radiata is new since the prior study, but not acute. The diffusion-weighted images demonstrate no acute or subacute infarction. The ventricles are of normal size. No significant white matter disease is present otherwise a remote lacunar infarct is present in the inferior right cerebellum. Vascular: Flow is present in the major intracranial arteries. Skull and upper cervical spine: The craniocervical junction is normal. Upper cervical spine is within normal limits. Marrow signal is unremarkable. Sinuses/Orbits: Chronic left maxillary sinus disease is present with a shrunken sinus. The paranasal sinuses mastoid air cells are clear. A right lens replacement is present. Globes and orbits are otherwise within normal limits. IMPRESSION: 1. No acute intracranial abnormality to explain left-sided weakness. 2. Stable remote lacunar infarcts of the left thalamus and inferior right cerebellum. 3. Posterior left corona radiata lacunar infarct is new since January, but not acute. Electronically Signed   By: San Morelle M.D.   On: 06/10/2019 15:22   US Carotid Bilateral (at [facility] Only)  Result Date:  06/11/2019 CLINICAL DATA:  TIA. Left-sided weakness, dysarthria x2 days. Hypertension, hyperlipidemia, diabetes EXAM: BILATERAL CAROTID DUPLEX ULTRASOUND TECHNIQUE: Pearline Cables scale imaging, color Doppler and duplex ultrasound were performed of bilateral carotid and vertebral arteries in the neck. COMPARISON:  12/12/2018 FINDINGS: Criteria: Quantification of carotid stenosis is based on velocity parameters that correlate the residual internal carotid diameter with NASCET-based stenosis levels, using the diameter of the distal internal carotid lumen as the denominator for stenosis measurement. The following velocity measurements were obtained: RIGHT ICA: 80/22 cm/sec CCA: 02/40 cm/sec SYSTOLIC  ICA/CCA RATIO:  1.0 ECA: 109 cm/sec LEFT ICA: 95/26 cm/sec CCA: 973/53 cm/sec SYSTOLIC ICA/CCA RATIO:  0.7 ECA: 90 cm/sec RIGHT CAROTID ARTERY: Mild partially calcified plaque in the bulb and proximal ICA resulting in at least mild stenosis. Normal waveforms and color Doppler signal. Mildly tortuous distal ICA. RIGHT VERTEBRAL ARTERY:  Normal flow direction and waveform. LEFT CAROTID ARTERY: Mild tortuosity. Intimal thickening through the common carotid artery. Partially calcified eccentric plaque in the bulb without high-grade stenosis. Mildly tortuous ICA. Normal waveforms and color Doppler signal. LEFT VERTEBRAL ARTERY:  Normal flow direction and waveform. IMPRESSION: 1. Bilateral carotid bifurcation plaque resulting in less than 50% diameter ICA stenosis. 2. Antegrade bilateral vertebral arterial flow. Electronically Signed   By: Lucrezia Europe M.D.   On: 06/11/2019 15:44        Discharge Exam: Vitals:   06/12/19 0330 06/12/19 0730  BP: (!) 156/73 (!) 168/78  Pulse: (!) 53 62  Resp: 17 18  Temp: 98.7 F (37.1 C) 98.3 F (36.8 C)  SpO2: 96% 95%   Vitals:   06/11/19 1930 06/11/19 2330 06/12/19 0330 06/12/19 0730  BP: (!) 149/79 (!) 147/76 (!) 156/73 (!) 168/78  Pulse: 78 66 (!) 53 62  Resp: 17 17 17 18   Temp: 98.5 F (36.9 C) 98.7 F (37.1 C) 98.7 F (37.1 C) 98.3 F (36.8 C)  TempSrc: Oral Oral Oral   SpO2: 95% 97% 96% 95%  Weight:      Height:        General: Pt is alert, awake, not in acute distress Cardiovascular: RRR, S1/S2 +, no rubs, no gallops Respiratory: CTA bilaterally, no wheezing, no rhonchi Abdominal: Soft, NT, ND, bowel sounds + Extremities: no edema, no cyanosis   The results of significant diagnostics from this hospitalization (including imaging, microbiology, ancillary and laboratory) are listed below for reference.    Significant Diagnostic Studies: Ct Head Wo Contrast  Result Date: 06/10/2019 CLINICAL DATA:  Left-sided weakness. EXAM: CT HEAD WITHOUT  CONTRAST TECHNIQUE: Contiguous axial images were obtained from the base of the skull through the vertex without intravenous contrast. COMPARISON:  Head CT 12/11/2018 and MRI 12/12/2018 FINDINGS: Brain: There is no evidence of acute infarct, intracranial hemorrhage, mass, midline shift, or extra-axial fluid collection. A chronic lacunar infarct is again noted in the left thalamus. The ventricles and sulci are normal. Vascular: Calcified atherosclerosis at the skull base. No hyperdense vessel. Skull: No fracture or focal osseous lesion. Sinuses/Orbits: Chronically small left maxillary sinus. Small volume bubbly fluid in the left sphenoid sinus. Clear mastoid air cells. Right cataract extraction. Other: None. IMPRESSION: 1. No evidence of acute intracranial abnormality. 2. Chronic left thalamic lacunar infarct. Electronically Signed   By: Logan Bores M.D.   On: 06/10/2019 14:56   Mr Angio Head Wo Contrast  Result Date: 06/10/2019 CLINICAL DATA:  Altered level of consciousness. Left-sided weakness beginning yesterday. EXAM: MRA HEAD WITHOUT CONTRAST TECHNIQUE: Angiographic images of the Circle of Willis  were obtained using MRA technique without intravenous contrast. COMPARISON:  MRI same day.  Head CT same day.  MRA 06/06/2018 FINDINGS: Both internal carotid arteries are widely patent through the skull base and siphon regions. The anterior and middle cerebral vessels are patent. There is a 50% stenosis of the main M2 branch of the right middle cerebral artery. Both vertebral arteries are patent to the basilar. No basilar stenosis. There is severe stenosis of both posterior cerebral arteries at the P1 P2 junction regions. IMPRESSION: Worsening intracranial atherosclerotic disease with worsening stenoses in both posterior cerebral arteries and in the right M2 level of the right MCA. Electronically Signed   By: Nelson Chimes M.D.   On: 06/10/2019 15:21   Mr Brain Wo Contrast  Result Date: 06/10/2019 CLINICAL DATA:   Altered level of consciousness. Unexplained. Episode of left-sided weakness yesterday morning which occurred again today. EXAM: MRI HEAD WITHOUT CONTRAST TECHNIQUE: Multiplanar, multiecho pulse sequences of the brain and surrounding structures were obtained without intravenous contrast. COMPARISON:  MRI brain 12/12/2018. FINDINGS: Brain: Remote lacunar infarcts of the left thalamus are again noted. A lacunar infarct of the posterior left corona radiata is new since the prior study, but not acute. The diffusion-weighted images demonstrate no acute or subacute infarction. The ventricles are of normal size. No significant white matter disease is present otherwise a remote lacunar infarct is present in the inferior right cerebellum. Vascular: Flow is present in the major intracranial arteries. Skull and upper cervical spine: The craniocervical junction is normal. Upper cervical spine is within normal limits. Marrow signal is unremarkable. Sinuses/Orbits: Chronic left maxillary sinus disease is present with a shrunken sinus. The paranasal sinuses mastoid air cells are clear. A right lens replacement is present. Globes and orbits are otherwise within normal limits. IMPRESSION: 1. No acute intracranial abnormality to explain left-sided weakness. 2. Stable remote lacunar infarcts of the left thalamus and inferior right cerebellum. 3. Posterior left corona radiata lacunar infarct is new since January, but not acute. Electronically Signed   By: San Morelle M.D.   On: 06/10/2019 15:22   US Carotid Bilateral (at [facility] Only)  Result Date: 06/11/2019 CLINICAL DATA:  TIA. Left-sided weakness, dysarthria x2 days. Hypertension, hyperlipidemia, diabetes EXAM: BILATERAL CAROTID DUPLEX ULTRASOUND TECHNIQUE: Pearline Cables scale imaging, color Doppler and duplex ultrasound were performed of bilateral carotid and vertebral arteries in the neck. COMPARISON:  12/12/2018 FINDINGS: Criteria: Quantification of carotid stenosis is  based on velocity parameters that correlate the residual internal carotid diameter with NASCET-based stenosis levels, using the diameter of the distal internal carotid lumen as the denominator for stenosis measurement. The following velocity measurements were obtained: RIGHT ICA: 80/22 cm/sec CCA: 40/81 cm/sec SYSTOLIC ICA/CCA RATIO:  1.0 ECA: 109 cm/sec LEFT ICA: 95/26 cm/sec CCA: 448/18 cm/sec SYSTOLIC ICA/CCA RATIO:  0.7 ECA: 90 cm/sec RIGHT CAROTID ARTERY: Mild partially calcified plaque in the bulb and proximal ICA resulting in at least mild stenosis. Normal waveforms and color Doppler signal. Mildly tortuous distal ICA. RIGHT VERTEBRAL ARTERY:  Normal flow direction and waveform. LEFT CAROTID ARTERY: Mild tortuosity. Intimal thickening through the common carotid artery. Partially calcified eccentric plaque in the bulb without high-grade stenosis. Mildly tortuous ICA. Normal waveforms and color Doppler signal. LEFT VERTEBRAL ARTERY:  Normal flow direction and waveform. IMPRESSION: 1. Bilateral carotid bifurcation plaque resulting in less than 50% diameter ICA stenosis. 2. Antegrade bilateral vertebral arterial flow. Electronically Signed   By: Lucrezia Europe M.D.   On: 06/11/2019 15:44  Microbiology: Recent Results (from the past 240 hour(s))  SARS Coronavirus 2 (CEPHEID - Performed in [facility] hospital lab), Hosp Order     Status: None   Collection Time: 06/10/19  1:54 PM   Specimen: Nasopharyngeal Swab  Result Value Ref Range Status   SARS Coronavirus 2 NEGATIVE NEGATIVE Final    Comment: (NOTE) If result is NEGATIVE SARS-CoV-2 target nucleic acids are NOT DETECTED. The SARS-CoV-2 RNA is generally detectable in upper and lower  respiratory specimens during the acute phase of infection. The lowest  concentration of SARS-CoV-2 viral copies this assay can detect is 250  copies / mL. A negative result does not preclude SARS-CoV-2 infection  and should not be used as the sole basis for  treatment or other  patient management decisions.  A negative result may occur with  improper specimen collection / handling, submission of specimen other  than nasopharyngeal swab, presence of viral mutation(s) within the  areas targeted by this assay, and inadequate number of viral copies  (<250 copies / mL). A negative result must be combined with clinical  observations, patient history, and epidemiological information. If result is POSITIVE SARS-CoV-2 target nucleic acids are DETECTED. The SARS-CoV-2 RNA is generally detectable in upper and lower  respiratory specimens dur ing the acute phase of infection.  Positive  results are indicative of active infection with SARS-CoV-2.  Clinical  correlation with patient history and other diagnostic information is  necessary to determine patient infection status.  Positive results do  not rule out bacterial infection or co-infection with other viruses. If result is PRESUMPTIVE POSTIVE SARS-CoV-2 nucleic acids MAY BE PRESENT.   A presumptive positive result was obtained on the submitted specimen  and confirmed on repeat testing.  While 2019 novel coronavirus  (SARS-CoV-2) nucleic acids may be present in the submitted sample  additional confirmatory testing may be necessary for epidemiological  and / or clinical management purposes  to differentiate between  SARS-CoV-2 and other Sarbecovirus currently known to infect humans.  If clinically indicated additional testing with an alternate test  methodology [phone]) is advised. The SARS-CoV-2 RNA is generally  detectable in upper and lower respiratory sp ecimens during the acute  phase of infection. The expected result is Negative. Fact Sheet for Patients:  StrictlyIdeas.no Fact Sheet for Healthcare Providers: BankingDealers.co.za This test is not yet approved or cleared by the [place] FDA and has been authorized for detection and/or  diagnosis of SARS-CoV-2 by FDA under an Emergency Use Authorization (EUA).  This EUA will remain in effect (meaning this test can be used) for the duration of the COVID-19 declaration under Section 564(b)(1) of the Act, 21 U.S.C. section 360bbb-3(b)(1), unless the authorization is terminated or revoked sooner. Performed at [facility], [address], [place], [place] [address]      Labs: Basic Metabolic Panel: Recent Labs  Lab 06/10/19 1337  NA 141  K 3.8  CL 103  CO2 30  GLUCOSE 148*  BUN 15  CREATININE 0.73  CALCIUM 9.1   Liver Function Tests: Recent Labs  Lab 06/10/19 1337  AST 16  ALT 17  ALKPHOS 85  BILITOT 0.4  PROT 6.8  ALBUMIN 3.4*   No results for input(s): LIPASE, AMYLASE in the last 168 hours. No results for input(s): AMMONIA in the last 168 hours. CBC: Recent Labs  Lab 06/10/19 1337  WBC 6.3  NEUTROABS 3.0  HGB 12.9  HCT 39.2  MCV 86.5  PLT 262   Cardiac Enzymes: No results  for input(s): CKTOTAL, CKMB, CKMBINDEX, TROPONINI in the last 168 hours. BNP: Invalid input(s): POCBNP CBG: Recent Labs  Lab 06/11/19 1122 06/11/19 1636 06/11/19 2207 06/12/19 0718 06/12/19 1110  GLUCAP 218* 159* 220* 142* 197*    Time coordinating discharge:  36 minutes  Signed:  Orson Eva, DO [facility] Pager: [phone] 06/12/2019, 1:42 PM

## 2019-06-15 ENCOUNTER — Telehealth: Payer: Self-pay | Admitting: Family Medicine

## 2019-06-15 ENCOUNTER — Other Ambulatory Visit: Payer: Self-pay | Admitting: Family Medicine

## 2019-06-15 DIAGNOSIS — G459 Transient cerebral ischemic attack, unspecified: Secondary | ICD-10-CM

## 2019-06-15 NOTE — Telephone Encounter (Signed)
Covering PCP- please advise and send to pools.

## 2019-06-15 NOTE — Telephone Encounter (Signed)
Referral placed. Was patient aware she has referral in place to Dr Tomi Likens in [place] already?

## 2019-06-15 NOTE — Telephone Encounter (Signed)
Patient aware and states she was not aware of previous referral.

## 2019-06-20 DIAGNOSIS — K219 Gastro-esophageal reflux disease without esophagitis: Secondary | ICD-10-CM | POA: Diagnosis not present

## 2019-06-20 DIAGNOSIS — R0902 Hypoxemia: Secondary | ICD-10-CM | POA: Diagnosis not present

## 2019-06-20 DIAGNOSIS — Z8673 Personal history of transient ischemic attack (TIA), and cerebral infarction without residual deficits: Secondary | ICD-10-CM | POA: Diagnosis not present

## 2019-06-20 DIAGNOSIS — I10 Essential (primary) hypertension: Secondary | ICD-10-CM | POA: Diagnosis not present

## 2019-06-20 DIAGNOSIS — G459 Transient cerebral ischemic attack, unspecified: Secondary | ICD-10-CM | POA: Diagnosis not present

## 2019-06-20 DIAGNOSIS — R531 Weakness: Secondary | ICD-10-CM | POA: Diagnosis not present

## 2019-06-20 DIAGNOSIS — G4730 Sleep apnea, unspecified: Secondary | ICD-10-CM | POA: Diagnosis not present

## 2019-06-20 DIAGNOSIS — F419 Anxiety disorder, unspecified: Secondary | ICD-10-CM | POA: Diagnosis not present

## 2019-06-20 DIAGNOSIS — J45909 Unspecified asthma, uncomplicated: Secondary | ICD-10-CM | POA: Diagnosis not present

## 2019-06-20 DIAGNOSIS — I672 Cerebral atherosclerosis: Secondary | ICD-10-CM | POA: Diagnosis not present

## 2019-06-20 DIAGNOSIS — E119 Type 2 diabetes mellitus without complications: Secondary | ICD-10-CM | POA: Diagnosis not present

## 2019-06-20 DIAGNOSIS — R0689 Other abnormalities of breathing: Secondary | ICD-10-CM | POA: Diagnosis not present

## 2019-06-20 DIAGNOSIS — F329 Major depressive disorder, single episode, unspecified: Secondary | ICD-10-CM | POA: Diagnosis not present

## 2019-06-21 DIAGNOSIS — G4733 Obstructive sleep apnea (adult) (pediatric): Secondary | ICD-10-CM | POA: Diagnosis not present

## 2019-06-23 ENCOUNTER — Telehealth: Payer: Self-pay | Admitting: Family Medicine

## 2019-06-23 ENCOUNTER — Ambulatory Visit: Payer: Medicare HMO | Admitting: *Deleted

## 2019-06-23 ENCOUNTER — Ambulatory Visit: Payer: Self-pay | Admitting: Licensed Clinical Social Worker

## 2019-06-23 DIAGNOSIS — E785 Hyperlipidemia, unspecified: Secondary | ICD-10-CM

## 2019-06-23 DIAGNOSIS — F411 Generalized anxiety disorder: Secondary | ICD-10-CM

## 2019-06-23 DIAGNOSIS — I10 Essential (primary) hypertension: Secondary | ICD-10-CM

## 2019-06-23 DIAGNOSIS — J441 Chronic obstructive pulmonary disease with (acute) exacerbation: Secondary | ICD-10-CM

## 2019-06-23 DIAGNOSIS — G43709 Chronic migraine without aura, not intractable, without status migrainosus: Secondary | ICD-10-CM

## 2019-06-23 DIAGNOSIS — E119 Type 2 diabetes mellitus without complications: Secondary | ICD-10-CM

## 2019-06-23 NOTE — Telephone Encounter (Signed)
appt made 07/02/19

## 2019-06-23 NOTE — Chronic Care Management (AMB) (Signed)
  Chronic Care Management   Follow Up Note   06/23/2019 Name: Yvonne [name] MRN: [id number] DOB: 04/14/1951  Referred by: [name], MD Reason for referral : Chronic Care Management ([name] outreach)   Yvonne [name] is a 68 y.o. year old female who is a primary care patient of [name], [name], MD. The CCM team was consulted for assistance with chronic disease management and care coordination needs.    Review of patient status, including review of consultants reports, relevant laboratory and other test results, and collaboration with appropriate care team members and the patient's provider was performed as part of comprehensive patient evaluation and provision of chronic care management services.   I spoke with [name] by telephone at the request of Legrand Como "Nicki Reaper" Forrest, [name] who was talking with her earlier regarding her psychosocial needs.    Goals Addressed            This Visit's Progress     Patient Stated   . "Would like improvement in overall health" (pt-stated)       I contacted [name] to address her overall weakness and recent hospitalization. She was difficult to engage and stated that she has an upcoming appt with Dr [name] in a week or so. I offered assistance today and she declined but agreed to call the office if she decided she needed to be seen sooner or to contact me if she would like nursing support.   Current Barriers:  . Lacks caregiver support.  . Chronic Disease Management support and education needs related to HTN, COPD, DM  Nurse Case Manager Clinical Goal(s):  [name] Kitchen Over the next 30 days, patient will work with Consulting civil engineer and PCP to address needs related to chronic medical conditions  Interventions:  . Chart reviewed . Collaboration with [name] regarding his earlier conversation with [name] . Verified upcoming appt with PCP . Offered to discuss current health concerns and provide education/assistance  Patient Self Care Activities:  . Performs  ADL's independently . Performs IADL's independently  Initial goal documentation        Plan The care management team will reach out to the patient again over the next 30 days.  The patient has been provided with contact information for the care management team and has been advised to call with any health related questions or concerns.  Next PCP appointment scheduled for: 07/02/2019   [name] BSN, RN-BC [place] [facility] [place] Management Direct Dial: [phone]

## 2019-06-23 NOTE — Chronic Care Management (AMB) (Signed)
Care Management Note   Yvonne [name] is a 68 y.o. year old female who is a primary care patient of [name], Yvonne Gash, MD. The CM team was consulted for assistance with chronic disease management and care coordination.   I reached out to Yvonne [name] by phone today.   Review of patient status, including review of consultants reports, relevant laboratory and other test results, and collaboration with appropriate care team members and the patient's provider was performed as part of comprehensive patient evaluation and provision of chronic care management services.   Social Determinants of Health: risk of depression; risk of stress; risk of financial challenges; risk of social isolation    Office Visit from 04/06/2019 in [place]  PHQ-9 Total Score  12     GAD 7 : Generalized Anxiety Score 12/05/2018 01/22/2018 04/30/2016 11/04/2015  Nervous, Anxious, on Edge 3 2 1  0  Control/stop worrying 3 3 3 1   Worry too much - different things 3 1 3 3   Trouble relaxing 3 1 3 3   Restless 2 1 2 2   Easily annoyed or irritable 2 1 2 2   Afraid - awful might happen 3 1 0 1  Total GAD 7 Score 19 10 14 12   Anxiety Difficulty Extremely difficult - Somewhat difficult -   Goals    . Client stated: "I want to get help in managing stress and anxiety" (pt-stated)     Current Barriers:  Yvonne [name] Kitchen Mental Health Concerns   . Financial challenges . Spouse of client is ill  Clinical Social Work Clinical Goal(s):  Yvonne [name] Kitchen Over the next 30 days, Yvonne [name] will talk with [name] regarding management of anxiety and stress symptoms of client  Interventions: . Previously encouraged client to use relaxation techniques of choice to manage anxiety and stress symptoms . Previously encouraged client to talk regularly with [name] to discuss anxiety and stress symptoms experienced by client . Previously encouraged client to talk with [name] as needed to discuss nursing needs of client . Talked with client about recent  hospitalizations of client . Collaborated with [name] regarding current needs of client   Patient Self Care Activities:  . Attends all scheduled provider appointments  . Client speaks daily with her sister for emotional support  .  Client enjoys reading as a way to manage stress   Plan: Client to call [name] as needed to discuss anxiety and stress issues of client [name] to call client in next 3 weeks to discuss relaxation techniques use of client (reading, gardening in flowers) Client to communicate with [name] to discuss client's nursing needs. Client to participate in self care activities of choice Client to communicate with her sister as a means of emotional support. Client to attend scheduled medical appointments  Initial goal documentation   Client spoke of her current needs. She said she had 2 recent hospitalizations.  She said she was most recently at [facility] in [place], [place]. She said she discharged from that hospital this past Sunday. She said: " I have had 7 TIAs in 10 days". She said her daughter in law was staying with her to provide help for client. She said she was sleeping well. She said she was eating adequately. She said she has appointment with neurologist scheduled for 07/06/2019. [name] thanked Yvonne [name] for phone call with [name] and encouraged her to call [name] as needed for social work support.   Follow Up Plan: [name] to call client in next 3 weeks to discuss  relaxation techniques use of client to manage symptoms faced  Yvonne [name].Yvonne [name] MSW, [name] Licensed Clinical Social Worker [facility] Care Management [phone]

## 2019-06-23 NOTE — Patient Instructions (Signed)
Licensed Clinical Social Worker Visit Information  Goals we discussed today:  Goals    . Client stated: "I want to get help in managing stress and anxiety" (pt-stated)     Current Barriers:  [name] Kitchen Mental Health Concerns   . Financial challenges . Spouse of client is ill  Clinical Social Work Clinical Goal(s):  [name] Kitchen Over the next 30 days, Cortasia will talk with [name] regarding management of anxiety and stress symptoms of client  Interventions: . Previously encouraged client to use relaxation techniques of choice to manage anxiety and stress symptoms . Previously encouraged client to talk regularly with [name] to discuss anxiety and stress symptoms experienced by client . Previously encouraged client to talk with [name] as needed to discuss nursing needs of client . Talked with client about her 2 recent hospitalizations . Collaborated with [name] regarding current client needs and status.  Patient Self Care Activities:  . Attends all scheduled provider appointments  . Client speaks daily with her sister for emotional support  .  Client enjoys reading as a way to manage stress   Plan: Client to call [name] as needed to discuss anxiety and stress issues of client [name] to call client in next 3 weeks to discuss relaxation techniques use of client (reading, gardening in flowers) Client to communicate with [name] to discuss client's nursing needs. Client to participate in self care activities of choice Client to communicate with her sister as a means of emotional support. Client to attend scheduled medical appointments  Initial goal documentation       Materials Provided: No  Follow Up Plan: [name] to call client in next 3 weeks to talk with client about use of relaxation techniques to help her manage symptoms faced.  The patient verbalized understanding of instructions provided today and declined a print copy of patient instruction materials.   [name].Boris Engelmann MSW, [name] Licensed Clinical Social  Worker [facility] Care Management [phone]

## 2019-06-23 NOTE — Patient Instructions (Signed)
Visit Information  Goals Addressed            This Visit's Progress     Patient Stated   . "Would like improvement in overall health" (pt-stated)       Current Barriers:  . Lacks caregiver support.  . Chronic Disease Management support and education needs related to HTN, COPD, DM  Nurse Case Manager Clinical Goal(s):  [name] Kitchen Over the next 30 days, patient will work with Consulting civil engineer and PCP to address needs related to chronic medical conditions  Interventions:  . Chart reviewed . Collaboration with [name] regarding his earlier conversation with Ms Ackert . Verified upcoming appt with PCP . Offered to discuss current health concerns and provide education/assistance  Patient Self Care Activities:  . Performs ADL's independently . Performs IADL's independently  Initial goal documentation        The patient verbalized understanding of instructions provided today and declined a print copy of patient instruction materials.   The care management team will reach out to the patient again over the next 30 days.    [name] BSN, RN-BC Embedded Chronic Care Manager [facility] / [place] Management Direct Dial: [phone]

## 2019-06-24 DIAGNOSIS — C441122 Basal cell carcinoma of skin of right lower eyelid, including canthus: Secondary | ICD-10-CM | POA: Diagnosis not present

## 2019-06-24 DIAGNOSIS — H029 Unspecified disorder of eyelid: Secondary | ICD-10-CM | POA: Diagnosis not present

## 2019-07-02 ENCOUNTER — Ambulatory Visit: Payer: Medicare HMO | Admitting: Family Medicine

## 2019-07-02 DIAGNOSIS — C441122 Basal cell carcinoma of skin of right lower eyelid, including canthus: Secondary | ICD-10-CM | POA: Diagnosis not present

## 2019-07-02 DIAGNOSIS — L570 Actinic keratosis: Secondary | ICD-10-CM | POA: Diagnosis not present

## 2019-07-02 DIAGNOSIS — B079 Viral wart, unspecified: Secondary | ICD-10-CM | POA: Diagnosis not present

## 2019-07-06 DIAGNOSIS — G459 Transient cerebral ischemic attack, unspecified: Secondary | ICD-10-CM | POA: Diagnosis not present

## 2019-07-06 DIAGNOSIS — G2581 Restless legs syndrome: Secondary | ICD-10-CM | POA: Diagnosis not present

## 2019-07-06 DIAGNOSIS — G4701 Insomnia due to medical condition: Secondary | ICD-10-CM | POA: Diagnosis not present

## 2019-07-06 DIAGNOSIS — G5603 Carpal tunnel syndrome, bilateral upper limbs: Secondary | ICD-10-CM | POA: Diagnosis not present

## 2019-07-10 ENCOUNTER — Other Ambulatory Visit: Payer: Self-pay | Admitting: Family Medicine

## 2019-07-10 ENCOUNTER — Other Ambulatory Visit: Payer: Self-pay | Admitting: *Deleted

## 2019-07-10 MED ORDER — GLIPIZIDE 5 MG PO TABS: 5.0000 mg | ORAL_TABLET | Freq: Every day | ORAL | 1 refills | Status: DC

## 2019-07-10 NOTE — Telephone Encounter (Signed)
Refill sent. Patient aware.  

## 2019-07-14 ENCOUNTER — Ambulatory Visit (INDEPENDENT_AMBULATORY_CARE_PROVIDER_SITE_OTHER): Payer: Medicare HMO | Admitting: Licensed Clinical Social Worker

## 2019-07-14 DIAGNOSIS — J441 Chronic obstructive pulmonary disease with (acute) exacerbation: Secondary | ICD-10-CM | POA: Diagnosis not present

## 2019-07-14 DIAGNOSIS — F411 Generalized anxiety disorder: Secondary | ICD-10-CM

## 2019-07-14 DIAGNOSIS — E119 Type 2 diabetes mellitus without complications: Secondary | ICD-10-CM | POA: Diagnosis not present

## 2019-07-14 DIAGNOSIS — E785 Hyperlipidemia, unspecified: Secondary | ICD-10-CM

## 2019-07-14 DIAGNOSIS — G43709 Chronic migraine without aura, not intractable, without status migrainosus: Secondary | ICD-10-CM

## 2019-07-14 DIAGNOSIS — I10 Essential (primary) hypertension: Secondary | ICD-10-CM | POA: Diagnosis not present

## 2019-07-14 NOTE — Patient Instructions (Signed)
Licensed Clinical Social Worker Visit Information  Goals we discussed today:  Goals        . Client stated: "I want to get help in managing stress and anxiety" (pt-stated)     Current Barriers:  [name] Kitchen Mental Health Concerns   . Financial challenges . Spouse of client is ill  Clinical Social Work Clinical Goal(s):  [name] Kitchen Over the next 30 days, Kang will talk with [name] regarding management of anxiety and stress symptoms of client  Interventions: . Previously encouraged client to use relaxation techniques of choice to manage anxiety and stress symptoms . Talked with client about current status of client talked with client about current social work needs of client . Encouraged client to talk with [name] as needed to discuss nursing needs of client . Previously encouraged client to talk with her sister as a means of emotional support  Patient Self Care Activities:  . Attends all scheduled provider appointments  . Client speaks daily with her sister for emotional support  .  Client enjoys reading as a way to manage stress   Plan: Client to call [name] as needed to discuss anxiety and stress issues of client [name] to call client in next 3 weeks to discuss relaxation techniques use of client (reading, gardening in flowers) Client to communicate with [name] to discuss client's nursing needs. Client to participate in self care activities of choice Client to communicate with her sister as a means of emotional support. Client to attend scheduled medical appointments  Initial goal documentation          Materials Provided: No  Follow Up Plan: [name] to call client in next 3 weeks to talk with client about her use of relaxation techniques to help her manage symptoms faced  The patient verbalized understanding of instructions provided today and declined a print copy of patient instruction materials.   [name].Adonay Scheier MSW, [name] Licensed Clinical Social Worker [facility] Care Management [phone]

## 2019-07-14 NOTE — Chronic Care Management (AMB) (Signed)
Care Management Note   Yvonne [name] is a 68 y.o. year old female who is a primary care patient of [name], [name], MD. The CM team was consulted for assistance with chronic disease management and care coordination.   I reached out to [name] by phone today.   Review of patient status, including review of consultants reports, relevant laboratory and other test results, and collaboration with appropriate care team members and the patient's provider was performed as part of comprehensive patient evaluation and provision of chronic care management services.   Social Determinants of Health: risk for financial strain; risk for social isolation;risk for depression; risk for stress    Office Visit from 04/06/2019 in [place]  PHQ-9 Total Score  12     GAD 7 : Generalized Anxiety Score 12/05/2018 01/22/2018 04/30/2016 11/04/2015  Nervous, Anxious, on Edge 3 2 1  0  Control/stop worrying 3 3 3 1   Worry too much - different things 3 1 3 3   Trouble relaxing 3 1 3 3   Restless 2 1 2 2   Easily annoyed or irritable 2 1 2 2   Afraid - awful might happen 3 1 0 1  Total GAD 7 Score 19 10 14 12   Anxiety Difficulty Extremely difficult - Somewhat difficult -    Goals        . Client stated: "I want to get help in managing stress and anxiety" (pt-stated)     Current Barriers:  [name] Kitchen Mental Health Concerns   . Financial challenges . Spouse ofG client is ill  Clinical Social Work Clinical Goal(s):  [name] Kitchen Over the next 30 days, Drake will talk with [name] regarding management of anxiety and stress symptoms of client  Interventions: . Previously encouraged client to use relaxation techniques of choice to manage anxiety and stress symptoms . Talked with client about current status of client and current social work needs of client . Encouraged client to talk with [name] as needed to discuss nursing needs of client . Previously encouraged client to talk with her sister as a means of  emotional support  Patient Self Care Activities:  . Attends all scheduled provider appointments  . Client speaks daily with her sister for emotional support  .  Client enjoys reading as a way to manage stress   Plan: Client to call [name] as needed to discuss anxiety and stress issues of client [name] to call client in next 3 weeks to discuss relaxation techniques use of client (reading, gardening in flowers) Client to communicate with [name] to discuss client's nursing needs. Client to participate in self care activities of choice Client to communicate with her sister as a means of emotional support. Client to attend scheduled medical appointments  Initial goal documentation     Client said she is staying at her home. She said she has her prescribed medications and is taking medications as prescribed. She said she is sleeping well. She said she has decreased appetite.    She said she is ambulating well. She did not mention any pain issues. She said she has appointment with Dr. [name] on 09/09/2019.  She has appointment with neurologist on 09/02/2019.  She drives herself to local appointments and to complete errands. [name] encouraged Rooney to call [name] as needed to discuss social work needs of client    Follow Up Plan: [name] to call client in next 3 weeks to talk with client about client use of relaxation techniques to help manage symptoms  faced  [name].Cicely Ortner MSW, [name] Licensed Clinical Social Worker [facility] Care Management [phone]

## 2019-07-22 DIAGNOSIS — G4733 Obstructive sleep apnea (adult) (pediatric): Secondary | ICD-10-CM | POA: Diagnosis not present

## 2019-08-03 ENCOUNTER — Other Ambulatory Visit: Payer: Self-pay | Admitting: Family Medicine

## 2019-08-05 ENCOUNTER — Ambulatory Visit (INDEPENDENT_AMBULATORY_CARE_PROVIDER_SITE_OTHER): Payer: Medicare HMO | Admitting: Licensed Clinical Social Worker

## 2019-08-05 DIAGNOSIS — E119 Type 2 diabetes mellitus without complications: Secondary | ICD-10-CM

## 2019-08-05 DIAGNOSIS — E785 Hyperlipidemia, unspecified: Secondary | ICD-10-CM | POA: Diagnosis not present

## 2019-08-05 DIAGNOSIS — J441 Chronic obstructive pulmonary disease with (acute) exacerbation: Secondary | ICD-10-CM

## 2019-08-05 DIAGNOSIS — I10 Essential (primary) hypertension: Secondary | ICD-10-CM | POA: Diagnosis not present

## 2019-08-05 DIAGNOSIS — F411 Generalized anxiety disorder: Secondary | ICD-10-CM

## 2019-08-05 DIAGNOSIS — G43709 Chronic migraine without aura, not intractable, without status migrainosus: Secondary | ICD-10-CM

## 2019-08-05 NOTE — Chronic Care Management (AMB) (Signed)
Care Management Note   Yvonne [name] is a 68 y.o. year old female who is a primary care patient of [name], [name], MD. The CM team was consulted for assistance with chronic disease management and care coordination.   I reached out to Yvonne [name] by phone today.   Review of patient status, including review of consultants reports, relevant laboratory and other test results, and collaboration with appropriate care team members and the patient's provider was performed as part of comprehensive patient evaluation and provision of chronic care management services.   Social Determinants of Health: risk of social isolation; risk of financial strain; risk of depression; risk of stress    Office Visit from 04/06/2019 in Yvonne [name]  PHQ-9 Total Score  12     GAD 7 : Generalized Anxiety Score 12/05/2018 01/22/2018 04/30/2016 11/04/2015  Nervous, Anxious, on Edge 3 2 1  0  Control/stop worrying 3 3 3 1   Worry too much - different things 3 1 3 3   Trouble relaxing 3 1 3 3   Restless 2 1 2 2   Easily annoyed or irritable 2 1 2 2   Afraid - awful might happen 3 1 0 1  Total GAD 7 Score 19 10 14 12   Anxiety Difficulty Extremely difficult - Somewhat difficult -   Medications   New medications from outside sources are available for reconciliation   albuterol (VENTOLIN HFA) 108 (90 Base) MCG/ACT inhaler    ALPRAZolam (XANAX) 1 MG tablet    amLODipine (NORVASC) 10 MG tablet    aspirin EC 325 MG tablet    atorvastatin (LIPITOR) 40 MG tablet    clopidogrel (PLAVIX) 75 MG tablet    ezetimibe (ZETIA) 10 MG tablet    fluticasone furoate-vilanterol (BREO ELLIPTA) 100-25 MCG/INH AEPB    furosemide (LASIX) 20 MG tablet    glipiZIDE (GLUCOTROL) 5 MG tablet    loratadine (CLARITIN) 10 MG tablet    ondansetron (ZOFRAN-ODT) 8 MG disintegrating tablet    SitaGLIPtin-MetFORMIN HCl 50-1000 MG TB24    topiramate (TOPAMAX) 200 MG tablet     Goals        . Client stated: "I want to get help in  managing stress and anxiety" (pt-stated)     Current Barriers:  [name] Kitchen Mental Health Concerns   . Financial challenges . Spouse of client is ill  Clinical Social Work Clinical Goal(s):  [name] Kitchen Over the next 30 days, Yvonne [name] will talk with [name] regarding management of anxiety and stress symptoms of client  Interventions: . Encouraged client to use relaxation techniques of choice to manage anxiety and stress symptoms . Encouraged client to talk regularly with [name] to discuss anxiety and stress symptoms experienced by client . Encouraged client to talk with [name] as needed to discuss nursing needs of client . Encouraged client to talk with her sister as a means of emotional support . Provided counseling support for client  Patient Self Care Activities:  . Attends all scheduled provider appointments  . Client speaks daily with her sister for emotional support  .  Client enjoys reading as a way to manage stress   Plan: Client to call [name] as needed to discuss anxiety and stress issues of client [name] to call client in next 3 weeks to discuss relaxation techniques use of client (reading, gardening in flowers) Client to communicate with [name] to discuss client's nursing needs. Client to participate in self care activities of choice Client to communicate with her sister as a means  of emotional support. Client to attend scheduled medical appointments  Initial goal documentation    .      Client said she has her prescribed medications and is taking medications as prescribed. She said she is sleeping well. She said she has decreased appetite.    She said she is ambulating well. She did not mention any pain issues. She said she has appointment with Dr. [name] on 09/09/2019.  She has appointment with neurologist on 09/02/2019.  She drives herself to local appointments and to complete errands. [name] encouraged [name] to call [name] as needed to discuss social work needs of client  Client llikes to read to help  her relax. She likes taking walks to help her relax.  Client talks to her sister regularly and client said that her sister is supportive.  Client drives to her medical appointments. Client spoke of her grandson being in jail currently. She spoke of legal issues of her grandson and said grandson has court hearing in October of 2020. [name] provided counseling support for client. [name] encouraged [name] to call [name] as needed to discuss social work needs of client  Follow Up Plan:  [name] to call client in next 3 weeks to discuss relaxation techniques use of client  [name].Johanna Matto MSW, [name] Licensed Clinical Social Worker [facility] Care Management [phone]

## 2019-08-05 NOTE — Patient Instructions (Signed)
Licensed Clinical Social Worker Visit Information  Goals we discussed today:  Goals        . Client stated: "I want to get help in managing stress and anxiety" (pt-stated)     Current Barriers:  [name] Kitchen Mental Health Concerns   . Financial challenges . Spouse of client is ill  Clinical Social Work Clinical Goal(s):  [name] Kitchen Over the next 30 days, Irisa will talk with [name] regarding management of anxiety and stress symptoms of client  Interventions: . Encouraged client to use relaxation techniques of choice to manage anxiety and stress symptoms . Encouraged client to talk regularly with [name] to discuss anxiety and stress symptoms experienced by client . Encouraged client to talk with [name] as needed to discuss nursing needs of client . Encouraged client to talk with her sister as a means of emotional support . Provided counseling support for client  Patient Self Care Activities:  . Attends all scheduled provider appointments  . Client speaks daily with her sister for emotional support  .  Client enjoys reading as a way to manage stress   Plan: Client to call [name] as needed to discuss anxiety and stress issues of client [name] to call client in next 3 weeks to discuss relaxation techniques use of client (reading, gardening in flowers) Client to communicate with [name] to discuss client's nursing needs. Client to participate in self care activities of choice Client to communicate with her sister as a means of emotional support. Client to attend scheduled medical appointments  Initial goal documentation        Materials Provided: No  Follow Up Plan: [name] to call client in next 3 weeks to discuss relaxation techniques use of  client  The patient verbalized understanding of instructions provided today and declined a print copy of patient instruction materials.   [name].Deigo Alonso MSW, [name] Licensed Clinical Social Worker [facility] Care  Management [phone]

## 2019-08-06 DIAGNOSIS — G40804 Other epilepsy, intractable, without status epilepticus: Secondary | ICD-10-CM | POA: Diagnosis not present

## 2019-08-13 ENCOUNTER — Ambulatory Visit (INDEPENDENT_AMBULATORY_CARE_PROVIDER_SITE_OTHER): Payer: Medicare HMO | Admitting: Family Medicine

## 2019-08-13 ENCOUNTER — Other Ambulatory Visit: Payer: Self-pay

## 2019-08-13 ENCOUNTER — Encounter: Payer: Self-pay | Admitting: Family Medicine

## 2019-08-13 DIAGNOSIS — H66006 Acute suppurative otitis media without spontaneous rupture of ear drum, recurrent, bilateral: Secondary | ICD-10-CM | POA: Diagnosis not present

## 2019-08-13 MED ORDER — LEVOFLOXACIN 500 MG PO TABS: 500.0000 mg | ORAL_TABLET | Freq: Every day | ORAL | 0 refills | Status: DC

## 2019-08-13 NOTE — Progress Notes (Signed)
Subjective:    Patient ID: Yvonne [name], female    DOB: October 23, 1951, 68 y.o.   MRN: [id number]   HPI: Yvonne [name] is a 68 y.o. female presenting for Patient presents with upper respiratory congestion. Ears hurt. There is moderate sore throat. Patient reports coughing frequently as well.  no sputum noted. There is no fever, chills or sweats. The patient denies being short of breath. Onset on awakening today.She has frequent ear infections and currently has bilateral P.E. tubes.    Depression screen [facility] 2/9 04/06/2019 01/28/2019 01/20/2019 12/05/2018 12/02/2018  Decreased Interest 2 2 2 2 2   Down, Depressed, Hopeless 1 1 2 2 3   PHQ - 2 Score 3 3 4 4 5   Altered sleeping 2 2 2 2 2   Tired, decreased energy 2 2 1 2 2   Change in appetite 3 3 2 3 3   Feeling bad or failure about yourself  1 1 1 2 3   Trouble concentrating 1 1 3 2 2   Moving slowly or fidgety/restless 0 2 2 1 2   Suicidal thoughts 0 0 0 0 0  PHQ-9 Score 12 14 15 16 19   Some recent data might be hidden     Relevant past medical, surgical, family and social history reviewed and updated as indicated.  Interim medical history since our last visit reviewed. Allergies and medications reviewed and updated.  ROS:  Review of Systems  Constitutional: Negative for chills and fever.  HENT: Positive for congestion, ear pain, postnasal drip, rhinorrhea, sinus pressure and sore throat. Negative for ear discharge, hearing loss, sneezing and trouble swallowing.   Respiratory: Negative for chest tightness and shortness of breath.   Cardiovascular: Negative for chest pain and palpitations.  Skin: Negative for rash.     Social History   Tobacco Use  Smoking Status Never Smoker  Smokeless Tobacco Never Used       Objective:     Wt Readings from Last 3 Encounters:  06/10/19 180 lb 12.4 oz (82 kg)  04/06/19 182 lb 9.6 oz (82.8 kg)  01/28/19 180 lb 4 oz (81.8 kg)     Exam deferred. Pt. Harboring due to COVID 19. Phone visit  performed.   Assessment & Plan:   1. Recurrent acute suppurative otitis media without spontaneous rupture of tympanic membrane of both sides     Meds ordered this encounter  Medications  . levofloxacin (LEVAQUIN) 500 MG tablet    Sig: Take 1 tablet (500 mg total) by mouth daily. For 10 days    Dispense:  10 tablet    Refill:  0    No orders of the defined types were placed in this encounter.     Diagnoses and all orders for this visit:  Recurrent acute suppurative otitis media without spontaneous rupture of tympanic membrane of both sides  Other orders -     levofloxacin (LEVAQUIN) 500 MG tablet; Take 1 tablet (500 mg total) by mouth daily. For 10 days    Virtual Visit via telephone Note  I discussed the limitations, risks, security and privacy concerns of performing an evaluation and management service by telephone and the availability of in person appointments. The patient was identified with two identifiers. Pt.expressed understanding and agreed to proceed. Pt. Is at home. Dr. [name] is in his office.  Follow Up Instructions:   I discussed the assessment and treatment plan with the patient. The patient was provided an opportunity to ask questions and all were answered. The patient agreed  with the plan and demonstrated an understanding of the instructions.   The patient was advised to call back or seek an in-person evaluation if the symptoms worsen or if the condition fails to improve as anticipated.   Total minutes including chart review and phone contact time: 5   Follow up plan: Return if symptoms worsen or fail to improve.  [name], MD [place]

## 2019-08-19 ENCOUNTER — Encounter: Payer: Self-pay | Admitting: Family Medicine

## 2019-08-19 ENCOUNTER — Ambulatory Visit (INDEPENDENT_AMBULATORY_CARE_PROVIDER_SITE_OTHER): Payer: Medicare HMO | Admitting: Family Medicine

## 2019-08-19 DIAGNOSIS — R05 Cough: Secondary | ICD-10-CM | POA: Diagnosis not present

## 2019-08-19 DIAGNOSIS — J988 Other specified respiratory disorders: Secondary | ICD-10-CM

## 2019-08-19 DIAGNOSIS — R059 Cough, unspecified: Secondary | ICD-10-CM

## 2019-08-19 MED ORDER — HYDROCODONE-HOMATROPINE 5-1.5 MG/5ML PO SYRP: 5.0000 mL | ORAL_SOLUTION | Freq: Three times a day (TID) | ORAL | 0 refills | Status: DC | PRN

## 2019-08-19 MED ORDER — PREDNISONE 10 MG (21) PO TBPK: ORAL_TABLET | ORAL | 0 refills | Status: DC

## 2019-08-19 NOTE — Progress Notes (Signed)
Virtual Visit via Telephone Note  I connected with Yvonne [name] on 08/19/19 at 9:30 AM by telephone and verified that I am speaking with the correct person using two identifiers. Yvonne [name] is currently located at home and nobody is currently with her during this visit. The provider, Loman Brooklyn, FNP is located in their home at time of visit.  I discussed the limitations, risks, security and privacy concerns of performing an evaluation and management service by telephone and the availability of in person appointments. I also discussed with the patient that there may be a patient responsible charge related to this service. The patient expressed understanding and agreed to proceed.  Subjective: PCP: [name], MD  Chief Complaint  Patient presents with  . Cough   Patient complains of cough, head/chest congestion, sore throat and ear pain/pressure. Cough is productive of dark yellow/green sputum. Additional symptoms include runny nose and postnasal drainage. Onset of symptoms was last week, gradually worsening since that time. She is drinking plenty of fluids. Evaluation to date: none. Treatment to date: antibiotics and Robitussin DM. She has a history of allergies and asthma. She does not smoke. Patient has not had recent close contact with someone who has tested positive for COVID-19.    ROS: Per HPI  Current Outpatient Medications:  .  albuterol (VENTOLIN HFA) 108 (90 Base) MCG/ACT inhaler, Inhale 2 puffs into the lungs every 6 (six) hours as needed for wheezing or shortness of breath., Disp: 18 Inhaler, Rfl: 5 .  ALPRAZolam (XANAX) 1 MG tablet, Take 1 tablet (1 mg total) by mouth 3 (three) times daily., Disp: 90 tablet, Rfl: 5 .  amLODipine (NORVASC) 10 MG tablet, TAKE 1 TABLET (5 MG TOTAL) BY MOUTH DAILY. (Patient taking differently: Take 10 mg by mouth every morning. ), Disp: 90 tablet, Rfl: 1 .  aspirin EC 325 MG tablet, Take 1 tablet (325 mg total) by mouth daily., Disp:  100 tablet, Rfl: 3 .  atorvastatin (LIPITOR) 40 MG tablet, TAKE 1 TABLET EVERY DAY AT 6PM (Patient taking differently: Take 40 mg by mouth every evening. ), Disp: 90 tablet, Rfl: 1 .  clopidogrel (PLAVIX) 75 MG tablet, Take 1 tablet (75 mg total) by mouth daily with breakfast., Disp: 7 tablet, Rfl: 0 .  ezetimibe (ZETIA) 10 MG tablet, Take 1 tablet (10 mg total) by mouth daily. For cholesterol (Patient taking differently: Take 10 mg by mouth every evening. For cholesterol), Disp: 90 tablet, Rfl: 1 .  fluticasone furoate-vilanterol (BREO ELLIPTA) 100-25 MCG/INH AEPB, Inhale 1 puff into the lungs daily. (Patient taking differently: Inhale 1 puff into the lungs daily as needed (for shortness of breath). ), Disp: 180 each, Rfl: 5 .  furosemide (LASIX) 20 MG tablet, Take 1 tablet (20 mg total) by mouth daily. (Patient taking differently: Take 20 mg by mouth daily as needed for fluid. ), Disp: 90 tablet, Rfl: 1 .  glipiZIDE (GLUCOTROL) 5 MG tablet, TAKE 1 TABLET (5 MG TOTAL) BY MOUTH DAILY BEFORE BREAKFAST., Disp: 30 tablet, Rfl: 1 .  levofloxacin (LEVAQUIN) 500 MG tablet, Take 1 tablet (500 mg total) by mouth daily. For 10 days, Disp: 10 tablet, Rfl: 0 .  loratadine (CLARITIN) 10 MG tablet, Take 1 tablet (10 mg total) by mouth daily., Disp: 90 tablet, Rfl: 3 .  ondansetron (ZOFRAN-ODT) 8 MG disintegrating tablet, Take 1 tablet (8 mg total) by mouth every 6 (six) hours as needed for nausea or vomiting., Disp: 20 tablet, Rfl: 1 .  SitaGLIPtin-MetFORMIN HCl 50-1000 MG TB24, Take 1 tablet by mouth 2 (two) times daily. For diabetes, Disp: 180 tablet, Rfl: 1 .  topiramate (TOPAMAX) 200 MG tablet, TAKE 1 TABLET (200 MG TOTAL) BY MOUTH AT BEDTIME., Disp: 90 tablet, Rfl: 0  Allergies  Allergen Reactions  . Penicillins Hives    Has patient had a PCN reaction causing immediate rash, facial/tongue/throat swelling, SOB or lightheadedness with hypotension: Yes Has patient had a PCN reaction causing severe rash  involving mucus membranes or skin necrosis: Unk Has patient had a PCN reaction that required hospitalization: No Has patient had a PCN reaction occurring within the last 10 years: No If all of the above answers are "NO", then may proceed with Cephalosporin use.   . Gabapentin Other (See Comments)    Causes a lot of lethargy at a higher dose  . Lisinopril Cough  . Soap Rash    DIAL soap   Past Medical History:  Diagnosis Date  . Anxiety   . Arthritis    left hand  . Asthma    daily and prn inhalers  . Chronic cough   . Chronic otitis media 12/2017  . Full dentures   . GERD (gastroesophageal reflux disease)   . History of stroke 09/2017   weakness right hand, numbness right side face  . Hyperlipidemia   . Hypertension    states under control with meds., has been on med. x a long time, per pt.  . Non-insulin dependent type 2 diabetes mellitus ([place])   . Overactive bladder   . Recurrent acute suppurative otitis media without spontaneous rupture of left tympanic membrane 02/19/2018  . Transient cerebral ischemia     Observations/Objective: A&O  No respiratory distress or wheezing audible over the phone Mood, judgement, and thought processes all WNL  Assessment and Plan: 1. Respiratory infection - Patient to continue Levaquin as prescribed last week. Symptoms management. Encouraged her to go for COVID testing. Discussed that COVID cannot be ruled out without testing.  - HYDROcodone-homatropine (HYCODAN) 5-1.5 MG/5ML syrup; Take 5 mLs by mouth every 8 (eight) hours as needed for cough.  Dispense: 120 mL; Refill: 0 - predniSONE (STERAPRED UNI-PAK 21 TAB) 10 MG (21) TBPK tablet; As directed x 6 days  Dispense: 21 tablet; Refill: 0  2. Cough - HYDROcodone-homatropine (HYCODAN) 5-1.5 MG/5ML syrup; Take 5 mLs by mouth every 8 (eight) hours as needed for cough.  Dispense: 120 mL; Refill: 0  Follow Up Instructions:  I discussed the assessment and treatment plan with the patient. The  patient was provided an opportunity to ask questions and all were answered. The patient agreed with the plan and demonstrated an understanding of the instructions.   The patient was advised to call back or seek an in-person evaluation if the symptoms worsen or if the condition fails to improve as anticipated.  The above assessment and management plan was discussed with the patient. The patient verbalized understanding of and has agreed to the management plan. Patient is aware to call the clinic if symptoms persist or worsen. Patient is aware when to return to the clinic for a follow-up visit. Patient educated on when it is appropriate to go to the emergency department.   Time call ended: 9:40 AM  I provided 12 minutes of non-face-to-face time during this encounter.  Hendricks Limes, MSN, APRN, FNP-C [facility] 08/19/19

## 2019-08-22 DIAGNOSIS — G4733 Obstructive sleep apnea (adult) (pediatric): Secondary | ICD-10-CM | POA: Diagnosis not present

## 2019-08-27 ENCOUNTER — Ambulatory Visit (INDEPENDENT_AMBULATORY_CARE_PROVIDER_SITE_OTHER): Payer: Medicare HMO | Admitting: Licensed Clinical Social Worker

## 2019-08-27 DIAGNOSIS — G43709 Chronic migraine without aura, not intractable, without status migrainosus: Secondary | ICD-10-CM

## 2019-08-27 DIAGNOSIS — E785 Hyperlipidemia, unspecified: Secondary | ICD-10-CM

## 2019-08-27 DIAGNOSIS — I10 Essential (primary) hypertension: Secondary | ICD-10-CM

## 2019-08-27 DIAGNOSIS — E119 Type 2 diabetes mellitus without complications: Secondary | ICD-10-CM | POA: Diagnosis not present

## 2019-08-27 DIAGNOSIS — F411 Generalized anxiety disorder: Secondary | ICD-10-CM

## 2019-08-27 DIAGNOSIS — J441 Chronic obstructive pulmonary disease with (acute) exacerbation: Secondary | ICD-10-CM

## 2019-08-27 NOTE — Patient Instructions (Signed)
Licensed Clinical Social Worker Visit Information  Goals we discussed today:  Goals        . Client stated: "I want to get help in managing stress and anxiety" (pt-stated)     Current Barriers:  [name] Kitchen Mental Health Concerns   . Financial challenges . Spouse of client is ill  Clinical Social Work Clinical Goal(s):  [name] Kitchen Over the next 30 days, Shwanda will talk with [name] regarding management of anxiety and stress symptoms of client  Interventions: . Encouraged client to use relaxation techniques of choice to manage anxiety and stress symptoms . Encouraged client to talk regularly with [name] to discuss anxiety and stress symptoms experienced by client . Encouraged client to talk with [name] as needed to discuss nursing needs of client . Encouraged client to talk with her sister as a means of emotional support . Talked with client about upcoming client appointments . Talked with her about Triage nurse support through [facility]  Patient Self Care Activities:  . Attends all scheduled provider appointments  . Client speaks daily with her sister for emotional support  .  Client enjoys reading as a way to manage stress   Plan: Client to call [name] as needed to discuss anxiety and stress issues of client [name] to call client in next 3 weeks to discuss relaxation techniques use of client (reading, gardening in flowers) Client to communicate with [name] to discuss client's nursing needs. Client to participate in self care activities of choice Client to communicate with her sister as a means of emotional support. Client to attend scheduled medical appointments  Initial goal documentation        Materials Provided: No   Follow Up Plan: [name] to call client in next 3 weeks to discuss relaxation techniques use of client    The patient verbalized understanding of instructions provided today and declined a print copy of patient instruction materials.   [name].Kayron Kalmar MSW, [name] Licensed Clinical Social  Worker [facility] [phone]

## 2019-08-27 NOTE — Chronic Care Management (AMB) (Signed)
Care Management Note   Yvonne [name] is a 68 y.o. year old female who is a primary care patient of [name], [name], MD. The CM team was consulted for assistance with chronic disease management and care coordination.   I reached out to Yvonne [name] by phone today.     Review of patient status, including review of consultants reports, relevant laboratory and other test results, and collaboration with appropriate care team members and the patient's provider was performed as part of comprehensive patient evaluation and provision of chronic care management services.   Social Determinants of Health: risk of depression; risk of stress; risk of social isolation; risk of financial strain    Office Visit from 04/06/2019 in [facility]  PHQ-9 Total Score  12     GAD 7 : Generalized Anxiety Score 12/05/2018 01/22/2018 04/30/2016 11/04/2015  Nervous, Anxious, on Edge 3 2 1  0  Control/stop worrying 3 3 3 1   Worry too much - different things 3 1 3 3   Trouble relaxing 3 1 3 3   Restless 2 1 2 2   Easily annoyed or irritable 2 1 2 2   Afraid - awful might happen 3 1 0 1  Total GAD 7 Score 19 10 14 12   Anxiety Difficulty Extremely difficult - Somewhat difficult -   Medications    albuterol (VENTOLIN HFA) 108 (90 Base) MCG/ACT inhaler    ALPRAZolam (XANAX) 1 MG tablet    amLODipine (NORVASC) 10 MG tablet    aspirin EC 325 MG tablet    atorvastatin (LIPITOR) 40 MG tablet    clopidogrel (PLAVIX) 75 MG tablet    ezetimibe (ZETIA) 10 MG tablet    fluticasone furoate-vilanterol (BREO ELLIPTA) 100-25 MCG/INH AEPB    furosemide (LASIX) 20 MG tablet    glipiZIDE (GLUCOTROL) 5 MG tablet    HYDROcodone-homatropine (HYCODAN) 5-1.5 MG/5ML syrup    levofloxacin (LEVAQUIN) 500 MG tablet    loratadine (CLARITIN) 10 MG tablet    ondansetron (ZOFRAN-ODT) 8 MG disintegrating tablet    predniSONE (STERAPRED UNI-PAK 21 TAB) 10 MG (21) TBPK tablet    pregabalin (LYRICA) 75 MG capsule    SitaGLIPtin-MetFORMIN HCl 50-1000 MG TB24    solifenacin (VESICARE) 10 MG tablet    topiramate (TOPAMAX) 200 MG tablet    Goals    . Client stated: "I want to get help in managing stress and anxiety" (pt-stated)     Current Barriers:  [name] Kitchen Mental Health Concerns   . Financial challenges . Spouse of client is ill  Clinical Social Work Clinical Goal(s):  [name] Kitchen Over the next 30 days, Yvonne [name] will talk with [name] regarding management of anxiety and stress symptoms of client  Interventions: . Encouraged client to use relaxation techniques of choice to manage anxiety and stress symptoms . Encouraged client to talk regularly with [name] to discuss anxiety and stress symptoms experienced by client . Encouraged client to talk with [name] as needed to discuss nursing needs of client . Encouraged client to talk with her sister as a means of emotional support . Talked about client's upcoming medical appointments (client has appointment with neurologist on 09/02/2019 and she has appointment with Dr. Livia [name] on 09/09/2019) . Talked with Yvonne [name] about Triage nurse support through [facility]  Patient Self Care Activities:  . Attends all scheduled provider appointments  . Client speaks daily with her sister for emotional support  .  Client enjoys reading as a way to manage stress   Plan: Client to call [name]  as needed to discuss anxiety and stress issues of client [name] to call client in next 3 weeks to discuss relaxation techniques use of client (reading, gardening in flowers) Client to communicate with [name] to discuss client's nursing needs. Client to participate in self care activities of choice Client to communicate with her sister as a means of emotional support.  Initial goal documentation    Follow Up Plan: [name] to call client in next 3 weeks to discuss relaxation techniques use of client  Yvonne [name].Yvonne [name] MSW, [name] Licensed Clinical Social Worker [facility]  [phone]

## 2019-08-29 ENCOUNTER — Other Ambulatory Visit: Payer: Self-pay | Admitting: Family Medicine

## 2019-09-02 DIAGNOSIS — G4701 Insomnia due to medical condition: Secondary | ICD-10-CM | POA: Diagnosis not present

## 2019-09-02 DIAGNOSIS — G2581 Restless legs syndrome: Secondary | ICD-10-CM | POA: Diagnosis not present

## 2019-09-02 DIAGNOSIS — G5603 Carpal tunnel syndrome, bilateral upper limbs: Secondary | ICD-10-CM | POA: Diagnosis not present

## 2019-09-02 DIAGNOSIS — G459 Transient cerebral ischemic attack, unspecified: Secondary | ICD-10-CM | POA: Diagnosis not present

## 2019-09-08 ENCOUNTER — Telehealth: Payer: Self-pay | Admitting: Family Medicine

## 2019-09-08 ENCOUNTER — Other Ambulatory Visit: Payer: Self-pay

## 2019-09-09 ENCOUNTER — Encounter: Payer: Self-pay | Admitting: Family Medicine

## 2019-09-09 ENCOUNTER — Ambulatory Visit (INDEPENDENT_AMBULATORY_CARE_PROVIDER_SITE_OTHER): Payer: Medicare HMO | Admitting: Family Medicine

## 2019-09-09 VITALS — BP 137/85 | HR 71 | Temp 97.5°F | Resp 16 | Ht 60.0 in | Wt 180.0 lb

## 2019-09-09 DIAGNOSIS — I10 Essential (primary) hypertension: Secondary | ICD-10-CM

## 2019-09-09 DIAGNOSIS — E1169 Type 2 diabetes mellitus with other specified complication: Secondary | ICD-10-CM | POA: Insufficient documentation

## 2019-09-09 DIAGNOSIS — E119 Type 2 diabetes mellitus without complications: Secondary | ICD-10-CM

## 2019-09-09 DIAGNOSIS — E6609 Other obesity due to excess calories: Secondary | ICD-10-CM | POA: Diagnosis not present

## 2019-09-09 DIAGNOSIS — Z6833 Body mass index (BMI) 33.0-33.9, adult: Secondary | ICD-10-CM | POA: Diagnosis not present

## 2019-09-09 DIAGNOSIS — E785 Hyperlipidemia, unspecified: Secondary | ICD-10-CM | POA: Diagnosis not present

## 2019-09-09 DIAGNOSIS — E756 Lipid storage disorder, unspecified: Secondary | ICD-10-CM | POA: Diagnosis not present

## 2019-09-09 LAB — CMP14+EGFR
ALT: 21 IU/L (ref 0–32)
AST: 14 IU/L (ref 0–40)
Albumin/Globulin Ratio: 1.7 (ref 1.2–2.2)
Albumin: 4.2 g/dL (ref 3.8–4.8)
Alkaline Phosphatase: 113 IU/L (ref 39–117)
BUN/Creatinine Ratio: 21 (ref 12–28)
BUN: 17 mg/dL (ref 8–27)
Bilirubin Total: 0.4 mg/dL (ref 0.0–1.2)
CO2: 24 mmol/L (ref 20–29)
Calcium: 9.6 mg/dL (ref 8.7–10.3)
Chloride: 103 mmol/L (ref 96–106)
Creatinine, Ser: 0.81 mg/dL (ref 0.57–1.00)
GFR calc Af Amer: 86 mL/min/{1.73_m2} (ref >59)
GFR calc non Af Amer: 74 mL/min/{1.73_m2} (ref >59)
Globulin, Total: 2.5 g/dL (ref 1.5–4.5)
Glucose: 182 mg/dL — ABNORMAL HIGH (ref 65–99)
Potassium: 4.3 mmol/L (ref 3.5–5.2)
Sodium: 139 mmol/L (ref 134–144)
Total Protein: 6.7 g/dL (ref 6.0–8.5)

## 2019-09-09 LAB — CBC WITH DIFFERENTIAL/PLATELET
Basophils Absolute: 0 10*3/uL (ref 0.0–0.2)
Basos: 1 %
EOS (ABSOLUTE): 0.2 10*3/uL (ref 0.0–0.4)
Eos: 3 %
Hematocrit: 40.3 % (ref 34.0–46.6)
Hemoglobin: 13.3 g/dL (ref 11.1–15.9)
Immature Grans (Abs): 0 10*3/uL (ref 0.0–0.1)
Immature Granulocytes: 0 %
Lymphocytes Absolute: 2.7 10*3/uL (ref 0.7–3.1)
Lymphs: 43 %
MCH: 28.4 pg (ref 26.6–33.0)
MCHC: 33 g/dL (ref 31.5–35.7)
MCV: 86 fL (ref 79–97)
Monocytes Absolute: 0.4 10*3/uL (ref 0.1–0.9)
Monocytes: 6 %
Neutrophils Absolute: 3 10*3/uL (ref 1.4–7.0)
Neutrophils: 47 %
Platelets: 296 10*3/uL (ref 150–450)
RBC: 4.69 x10E6/uL (ref 3.77–5.28)
RDW: 13.1 % (ref 11.7–15.4)
WBC: 6.4 10*3/uL (ref 3.4–10.8)

## 2019-09-09 LAB — LIPID PANEL
Chol/HDL Ratio: 3.2 ratio (ref 0.0–4.4)
Cholesterol, Total: 190 mg/dL (ref 100–199)
HDL: 59 mg/dL (ref >39)
LDL Chol Calc (NIH): 113 mg/dL — ABNORMAL HIGH (ref 0–99)
Triglycerides: 100 mg/dL (ref 0–149)
VLDL Cholesterol Cal: 18 mg/dL (ref 5–40)

## 2019-09-09 LAB — BAYER DCA HB A1C WAIVED: HB A1C (BAYER DCA - WAIVED): 7.9 % — ABNORMAL HIGH (ref <7.0)

## 2019-09-09 MED ORDER — EZETIMIBE 10 MG PO TABS: 10.0000 mg | ORAL_TABLET | Freq: Every day | ORAL | 1 refills | Status: DC

## 2019-09-09 MED ORDER — ATORVASTATIN CALCIUM 40 MG PO TABS: ORAL_TABLET | ORAL | 1 refills | Status: DC

## 2019-09-09 MED ORDER — AMLODIPINE BESYLATE 10 MG PO TABS: ORAL_TABLET | ORAL | 1 refills | Status: DC

## 2019-09-09 MED ORDER — SOLIFENACIN SUCCINATE 10 MG PO TABS: 10.0000 mg | ORAL_TABLET | Freq: Every day | ORAL | 1 refills | Status: DC

## 2019-09-09 MED ORDER — FUROSEMIDE 20 MG PO TABS: 20.0000 mg | ORAL_TABLET | Freq: Every day | ORAL | 1 refills | Status: DC

## 2019-09-09 MED ORDER — GLIPIZIDE 5 MG PO TABS: 5.0000 mg | ORAL_TABLET | Freq: Every day | ORAL | 0 refills | Status: DC

## 2019-09-09 NOTE — Progress Notes (Signed)
Subjective:  Patient ID: Yvonne [name],  female    DOB: 02/16/1951  Age: 68 y.o.    CC: Medical Management of Chronic Issues   HPI [name] presents for  follow-up of hypertension. Patient has no history of headache chest pain or shortness of breath or recent cough. Patient also denies symptoms of TIA such as numbness weakness lateralizing. Patient denies side effects from medication. States taking it regularly.  Patient also  in for follow-up of elevated cholesterol. Doing well without complaints on current medication. Denies side effects  including myalgia and arthralgia and nausea. Also in today for liver function testing. Currently no chest pain, shortness of breath or other cardiovascular related symptoms noted.  Follow-up of diabetes. Patient does check blood sugar at home. Readings run between 90 and 128.  She says the glipizide has worked wonders for bring her sugar down. Patient denies symptoms such as excessive hunger or urinary frequency, excessive hunger, nausea No significant hypoglycemic spells noted. Medications reviewed. Pt reports taking them regularly. Pt. denies complication/adverse reaction today.   [name] has also been seen routinely for ear infections.  She is having some pain currently in the right ear and some blood this morning came from it.  There is no pain and no blood currently.  Patient was recently evaluated in the emergency room with an MRI of 614 left-sided weakness.  The patient says the pain and weakness will start down her left leg and work its way up from the ankle all the way to her left side and then to her left shoulder.  It is short-lived and about the same every time she has been to the ER recently.  She was at neurology a couple of days ago and they have decided to work her up for possible seizure.  She has had 2 of those spells lasting a few minutes only since she was discharged from the emergency room. History Lasundra has a past medical  history of Anxiety, Arthritis, Asthma, Chronic cough, Chronic otitis media (12/2017), Full dentures, GERD (gastroesophageal reflux disease), History of stroke (09/2017), Hyperlipidemia, Hypertension, Non-insulin dependent type 2 diabetes mellitus ([place]), Overactive bladder, Recurrent acute suppurative otitis media without spontaneous rupture of left tympanic membrane (02/19/2018), and Transient cerebral ischemia.   She has a past surgical history that includes Cholecystectomy; Colonoscopy (N/A, 08/17/2015); Abdominal hysterectomy; Lacrimal duct exploration (Bilateral); Cataract extraction w/ intraocular lens implant (Right); Vitrectomy (Left, 09/14/2015); Gas insertion (Right); and Myringotomy with tube placement (Bilateral, 01/28/2018).   Her family history includes Arthritis in her brother and mother; Arthritis-Osteo in her son; Asthma in her father; CVA in her mother; Cancer in her brother; Diabetes in her father and mother; Heart disease in her father; Hepatitis C in her sister; Peripheral Artery Disease in her daughter; Stroke in her mother.She reports that she has never smoked. She has never used smokeless tobacco. She reports that she does not drink alcohol or use drugs.  Current Outpatient Medications on File Prior to Visit  Medication Sig Dispense Refill   albuterol (VENTOLIN HFA) 108 (90 Base) MCG/ACT inhaler Inhale 2 puffs into the lungs every 6 (six) hours as needed for wheezing or shortness of breath. 18 Inhaler 5   ALPRAZolam (XANAX) 1 MG tablet Take 1 tablet (1 mg total) by mouth 3 (three) times daily. 90 tablet 5   aspirin EC 325 MG tablet Take 1 tablet (325 mg total) by mouth daily. 100 tablet 3   clopidogrel (PLAVIX) 75 MG tablet Take  1 tablet (75 mg total) by mouth daily with breakfast. 7 tablet 0   fluticasone furoate-vilanterol (BREO ELLIPTA) 100-25 MCG/INH AEPB Inhale 1 puff into the lungs daily. (Patient taking differently: Inhale 1 puff into the lungs daily as needed (for  shortness of breath). ) 180 each 5   loratadine (CLARITIN) 10 MG tablet Take 1 tablet (10 mg total) by mouth daily. 90 tablet 3   pregabalin (LYRICA) 75 MG capsule Take 2 capsules by mouth at bedtime.     topiramate (TOPAMAX) 200 MG tablet TAKE 1 TABLET (200 MG TOTAL) BY MOUTH AT BEDTIME. 90 tablet 0   ondansetron (ZOFRAN-ODT) 8 MG disintegrating tablet Take 1 tablet (8 mg total) by mouth every 6 (six) hours as needed for nausea or vomiting. (Patient not taking: Reported on 09/09/2019) 20 tablet 1   SitaGLIPtin-MetFORMIN HCl 50-1000 MG TB24 Take 1 tablet by mouth 2 (two) times daily. For diabetes (Patient not taking: Reported on 09/09/2019) 180 tablet 1   No current facility-administered medications on file prior to visit.     ROS Review of Systems  Constitutional: Negative.   HENT: Negative for congestion.   Eyes: Negative for visual disturbance.  Respiratory: Negative for shortness of breath.   Cardiovascular: Negative for chest pain.  Gastrointestinal: Negative for abdominal pain, constipation, diarrhea, nausea and vomiting.  Genitourinary: Negative for difficulty urinating.  Musculoskeletal: Negative for arthralgias and myalgias.  Neurological: Negative for headaches.  Psychiatric/Behavioral: Negative for sleep disturbance.    Objective:  BP 137/85    Pulse 71    Temp (!) 97.5 F (36.4 C) (Temporal)    Resp 16    Ht 5' (1.524 m)    Wt 180 lb (81.6 kg)    SpO2 100%    BMI 35.15 kg/m   BP Readings from Last 3 Encounters:  09/09/19 137/85  06/12/19 (!) 168/78  04/06/19 (!) 171/92    Wt Readings from Last 3 Encounters:  09/09/19 180 lb (81.6 kg)  06/10/19 180 lb 12.4 oz (82 kg)  04/06/19 182 lb 9.6 oz (82.8 kg)     Physical Exam Constitutional:      General: She is not in acute distress.    Appearance: She is well-developed.  HENT:     Head: Normocephalic and atraumatic.     Comments: Ear tubes in place. No blood in canal.    Right Ear: Tympanic membrane and  external ear normal.     Left Ear: Tympanic membrane and external ear normal.     Nose: Nose normal.  Eyes:     Conjunctiva/sclera: Conjunctivae normal.     Pupils: Pupils are equal, round, and reactive to light.  Neck:     Musculoskeletal: Normal range of motion and neck supple.     Thyroid: No thyromegaly.  Cardiovascular:     Rate and Rhythm: Normal rate and regular rhythm.     Heart sounds: Normal heart sounds. No murmur.  Pulmonary:     Effort: Pulmonary effort is normal. No respiratory distress.     Breath sounds: Normal breath sounds. No wheezing or rales.  Abdominal:     General: Bowel sounds are normal. There is no distension.     Palpations: Abdomen is soft.     Tenderness: There is no abdominal tenderness.  Lymphadenopathy:     Cervical: No cervical adenopathy.  Skin:    General: Skin is warm and dry.  Neurological:     Mental Status: She is alert and oriented to person, place, and  time.     Deep Tendon Reflexes: Reflexes are normal and symmetric.  Psychiatric:        Behavior: Behavior normal.        Thought Content: Thought content normal.        Judgment: Judgment normal.     Diabetic Foot Exam - Simple   No data filed        Assessment & Plan:   Kymiah was seen today for medical management of chronic issues.  Diagnoses and all orders for this visit:  Essential hypertension -     CBC with Differential/Platelet -     CMP14+EGFR  Class 1 obesity due to excess calories with serious comorbidity and body mass index (BMI) of 33.0 to 33.9 in adult -     CBC with Differential/Platelet -     CMP14+EGFR -     Lipid panel  Type 2 diabetes mellitus without complication, without long-term current use of insulin (HCC) -     CBC with Differential/Platelet -     CMP14+EGFR -     Lipid panel -     Bayer DCA Hb A1c Waived  Hyperlipidemia with target LDL less than 70 -     CBC with Differential/Platelet -     CMP14+EGFR -     Lipid panel  Diabetic  lipidosis (HCC)  Other orders -     atorvastatin (LIPITOR) 40 MG tablet; TAKE 1 TABLET EVERY DAY AT 6PM -     amLODipine (NORVASC) 10 MG tablet; TAKE 1 TABLET (5 MG TOTAL) BY MOUTH DAILY. -     solifenacin (VESICARE) 10 MG tablet; Take 1 tablet (10 mg total) by mouth daily. -     ezetimibe (ZETIA) 10 MG tablet; Take 1 tablet (10 mg total) by mouth daily. For cholesterol -     furosemide (LASIX) 20 MG tablet; Take 1 tablet (20 mg total) by mouth daily. -     glipiZIDE (GLUCOTROL) 5 MG tablet; Take 1 tablet (5 mg total) by mouth daily before breakfast.   I have discontinued [name]. Hilger's levofloxacin, HYDROcodone-homatropine, and predniSONE. I have also changed her solifenacin. Additionally, I am having her maintain her albuterol, fluticasone furoate-vilanterol, aspirin EC, loratadine, topiramate, ondansetron, ALPRAZolam, SitaGLIPtin-MetFORMIN HCl, clopidogrel, pregabalin, atorvastatin, amLODipine, ezetimibe, furosemide, and glipiZIDE.  Meds ordered this encounter  Medications   atorvastatin (LIPITOR) 40 MG tablet    Sig: TAKE 1 TABLET EVERY DAY AT 6PM    Dispense:  90 tablet    Refill:  1   amLODipine (NORVASC) 10 MG tablet    Sig: TAKE 1 TABLET (5 MG TOTAL) BY MOUTH DAILY.    Dispense:  90 tablet    Refill:  1   solifenacin (VESICARE) 10 MG tablet    Sig: Take 1 tablet (10 mg total) by mouth daily.    Dispense:  90 tablet    Refill:  1   ezetimibe (ZETIA) 10 MG tablet    Sig: Take 1 tablet (10 mg total) by mouth daily. For cholesterol    Dispense:  90 tablet    Refill:  1   furosemide (LASIX) 20 MG tablet    Sig: Take 1 tablet (20 mg total) by mouth daily.    Dispense:  90 tablet    Refill:  1   glipiZIDE (GLUCOTROL) 5 MG tablet    Sig: Take 1 tablet (5 mg total) by mouth daily before breakfast.    Dispense:  90 tablet    Refill:  0  Follow-up: Return in about 3 months (around 12/10/2019).  [name], M.D.

## 2019-09-10 ENCOUNTER — Other Ambulatory Visit: Payer: Self-pay | Admitting: Family Medicine

## 2019-09-10 MED ORDER — SITAGLIPTIN PHOSPHATE 100 MG PO TABS: 100.0000 mg | ORAL_TABLET | Freq: Every day | ORAL | 2 refills | Status: DC

## 2019-09-15 ENCOUNTER — Other Ambulatory Visit: Payer: Self-pay | Admitting: Family Medicine

## 2019-09-15 MED ORDER — ATORVASTATIN CALCIUM 40 MG PO TABS: ORAL_TABLET | ORAL | 1 refills | Status: DC

## 2019-09-18 ENCOUNTER — Ambulatory Visit: Payer: Self-pay | Admitting: Licensed Clinical Social Worker

## 2019-09-18 DIAGNOSIS — G43709 Chronic migraine without aura, not intractable, without status migrainosus: Secondary | ICD-10-CM

## 2019-09-18 DIAGNOSIS — E119 Type 2 diabetes mellitus without complications: Secondary | ICD-10-CM

## 2019-09-18 DIAGNOSIS — I10 Essential (primary) hypertension: Secondary | ICD-10-CM

## 2019-09-18 DIAGNOSIS — J441 Chronic obstructive pulmonary disease with (acute) exacerbation: Secondary | ICD-10-CM

## 2019-09-18 DIAGNOSIS — F411 Generalized anxiety disorder: Secondary | ICD-10-CM

## 2019-09-18 DIAGNOSIS — E785 Hyperlipidemia, unspecified: Secondary | ICD-10-CM

## 2019-09-18 NOTE — Patient Instructions (Signed)
Licensed Clinical Social Worker Visit Information  Goals we discussed today:  Goals        . Client stated: "I want to get help in managing stress and anxiety" (pt-stated)     Current Barriers:  [name] Kitchen Mental Health Concerns   . Financial challenges . Spouse of client is ill  Clinical Social Work Clinical Goal(s):  [name] Kitchen Over the next 30 days, Tamaira will talk with [name] regarding management of anxiety and stress symptoms of client  Interventions: . Encouraged client previously to use relaxation techniques of choice to manage anxiety and stress symptoms . Encouraged client previously to talk regularly with [name] to discuss anxiety and stress symptoms experienced by client . Previously [name] encouraged client to talk with [name] as needed to discuss nursing needs of client . Previously [name] encouraged client to talk with her sister as a means of emotional support  Patient Self Care Activities:  . Attends all scheduled provider appointments  . Client speaks daily with her sister for emotional support  .  Client enjoys reading as a way to manage stress   Plan: Client to call [name] as needed to discuss anxiety and stress issues of client [name] to call client in next 3 weeks to discuss relaxation techniques use of client (reading, gardening in flowers) Client to communicate with [name] to discuss client's nursing needs. Client to participate in self care activities of choice Client to communicate with her sister as a means of emotional support. Client to attend scheduled medical appointments  Initial goal documentation            Materials Provided: No  Follow Up Plan:  [name] to call client in next 3 weeks to discuss relaxation techniques use by client  The patient verbalized understanding of instructions provided today and declined a print copy of patient instruction materials.   [name].Aleda Madl MSW, [name] Licensed Clinical Social Worker [facility]  [facility] [phone]

## 2019-09-18 NOTE — Chronic Care Management (AMB) (Signed)
Care Management Note   Yvonne [name] is a 68 y.o. year old female who is a primary care patient of [name], [name], MD. The CM team was consulted for assistance with chronic disease management and care coordination.   I reached out to [name] by phone toda   Review of patient status, including review of consultants reports, relevant laboratory and other test results, and collaboration with appropriate care team members and the patient's provider was performed as part of comprehensive patient evaluation and provision of chronic care management services.   Social Determinants of health: risk of social isolation ; risk of financial strain; risk of stress; risk of physical inactivity    Office Visit from 04/06/2019 in [place]  PHQ-9 Total Score  12     GAD 7 : Generalized Anxiety Score 09/09/2019 12/05/2018 01/22/2018 04/30/2016  Nervous, Anxious, on Edge 0 3 2 1   Control/stop worrying 1 3 3 3   Worry too much - different things 1 3 1 3   Trouble relaxing 1 3 1 3   Restless 1 2 1 2   Easily annoyed or irritable 0 2 1 2   Afraid - awful might happen 0 3 1 0  Total GAD 7 Score 4 19 10 14   Anxiety Difficulty - Extremely difficult - Somewhat difficult   Medications   New medications from outside sources are available for reconciliation   albuterol (VENTOLIN HFA) 108 (90 Base) MCG/ACT inhaler    ALPRAZolam (XANAX) 1 MG tablet    amLODipine (NORVASC) 10 MG tablet    aspirin EC 325 MG tablet    atorvastatin (LIPITOR) 40 MG tablet    clopidogrel (PLAVIX) 75 MG tablet    ezetimibe (ZETIA) 10 MG tablet    fluticasone furoate-vilanterol (BREO ELLIPTA) 100-25 MCG/INH AEPB    furosemide (LASIX) 20 MG tablet    glipiZIDE (GLUCOTROL) 5 MG tablet    loratadine (CLARITIN) 10 MG tablet    ondansetron (ZOFRAN-ODT) 8 MG disintegrating tablet    pregabalin (LYRICA) 75 MG capsule    sitaGLIPtin (JANUVIA) 100 MG tablet    SitaGLIPtin-MetFORMIN HCl 50-1000 MG TB24    solifenacin (VESICARE) 10 MG tablet    topiramate (TOPAMAX) 200 MG tablet    Goals        . Client stated: "I want to get help in managing stress and anxiety" (pt-stated)     Current Barriers:  [name] Kitchen Mental Health Concerns   . Financial challenges . Spouse of client is ill  Clinical Social Work Clinical Goal(s):  [name] Kitchen Over the next 30 days, Shamecka will talk with [name] regarding management of anxiety and stress symptoms of client  Interventions: . Encouraged client previously to use relaxation techniques of choice to manage anxiety and stress symptoms . Encouraged client previously to talk regularly with [name] to discuss anxiety and stress symptoms experienced by client . Previously [name] encouraged client to talk with [name] as needed to discuss nursing needs of client . Previously [name] encouraged client to talk with her sister as a means of emotional support  Patient Self Care Activities:  . Attends all scheduled provider appointments  . Client speaks daily with her sister for emotional support  .  Client enjoys reading as a way to manage stress   Plan: Client to call [name] as needed to discuss anxiety and stress issues of client [name] to call client in next 3 weeks to discuss relaxation techniques use of client (reading, gardening in flowers) Client to communicate with RN  CM to discuss client's nursing needs. Client to participate in self care activities of choice Client to communicate with her sister as a means of emotional support. Client to attend scheduled medical appointments  Initial goal documentation      Follow Up Plan: [name] to call client in next 3 weeks to talk with client about relaxation techniques use by client  [name].Jessica Seidman MSW, [name] Licensed Clinical Social Worker [facility] [phone]

## 2019-09-21 DIAGNOSIS — G4733 Obstructive sleep apnea (adult) (pediatric): Secondary | ICD-10-CM | POA: Diagnosis not present

## 2019-10-06 ENCOUNTER — Ambulatory Visit (INDEPENDENT_AMBULATORY_CARE_PROVIDER_SITE_OTHER): Payer: Medicare HMO | Admitting: Family Medicine

## 2019-10-06 ENCOUNTER — Encounter: Payer: Self-pay | Admitting: Family Medicine

## 2019-10-06 DIAGNOSIS — R05 Cough: Secondary | ICD-10-CM

## 2019-10-06 DIAGNOSIS — R053 Chronic cough: Secondary | ICD-10-CM

## 2019-10-06 MED ORDER — HYDROCODONE-HOMATROPINE 5-1.5 MG/5ML PO SYRP: 5.0000 mL | ORAL_SOLUTION | Freq: Four times a day (QID) | ORAL | 0 refills | Status: AC | PRN

## 2019-10-06 NOTE — Progress Notes (Signed)
Subjective:    Patient ID: Yvonne [name], female    DOB: 06-12-1951, 68 y.o.   MRN: [id number]   HPI: Yvonne [name] is a 68 y.o. female presenting for cough. Moderately severe. Lost a lot of sleep. Recurrent from a few weeks ago;. Started last night. Free of all other symptoms. Needs a refill of hycodan. She has not had fever or dyspnea. No GI sx including diarrhea. Denies loss of sense of taste. The cough is dry.    Depression screen [facility] 2/9 09/09/2019 04/06/2019 01/28/2019 01/20/2019 12/05/2018  Decreased Interest 0 2 2 2 2   Down, Depressed, Hopeless 0 1 1 2 2   PHQ - 2 Score 0 3 3 4 4   Altered sleeping - 2 2 2 2   Tired, decreased energy - 2 2 1 2   Change in appetite - 3 3 2 3   Feeling bad or failure about yourself  - 1 1 1 2   Trouble concentrating - 1 1 3 2   Moving slowly or fidgety/restless - 0 2 2 1   Suicidal thoughts - 0 0 0 0  PHQ-9 Score - 12 14 15 16   Some recent data might be hidden     Relevant past medical, surgical, family and social history reviewed and updated as indicated.  Interim medical history since our last visit reviewed. Allergies and medications reviewed and updated.  ROS:  Review of Systems  Constitutional: Negative for appetite change, chills, diaphoresis, fatigue and fever.  HENT: Negative for congestion, ear pain, hearing loss, postnasal drip, rhinorrhea, sore throat and trouble swallowing.   Respiratory: Positive for cough. Negative for chest tightness and shortness of breath.   Cardiovascular: Negative for chest pain and palpitations.  Gastrointestinal: Negative for abdominal pain.  Musculoskeletal: Negative for arthralgias.  Skin: Negative for rash.     Social History   Tobacco Use  Smoking Status Never Smoker  Smokeless Tobacco Never Used       Objective:     Wt Readings from Last 3 Encounters:  09/09/19 180 lb (81.6 kg)  06/10/19 180 lb 12.4 oz (82 kg)  04/06/19 182 lb 9.6 oz (82.8 kg)     Exam deferred. Pt. Harboring due to COVID  19. Phone visit performed.   Assessment & Plan:   1. Chronic cough     Meds ordered this encounter  Medications  . HYDROcodone-homatropine (HYCODAN) 5-1.5 MG/5ML syrup    Sig: Take 5 mLs by mouth every 6 (six) hours as needed for up to 5 days for cough.    Dispense:  100 mL    Refill:  0    No orders of the defined types were placed in this encounter.     Diagnoses and all orders for this visit:  Chronic cough  Other orders -     HYDROcodone-homatropine (HYCODAN) 5-1.5 MG/5ML syrup; Take 5 mLs by mouth every 6 (six) hours as needed for up to 5 days for cough.    Virtual Visit via telephone Note  I discussed the limitations, risks, security and privacy concerns of performing an evaluation and management service by telephone and the availability of in person appointments. The patient was identified with two identifiers. Pt.expressed understanding and agreed to proceed. Pt. Is at home. Dr. [name] is in his office.  Follow Up Instructions:   I discussed the assessment and treatment plan with the patient. The patient was provided an opportunity to ask questions and all were answered. The patient agreed with the plan and demonstrated an  understanding of the instructions.   The patient was advised to call back or seek an in-person evaluation if the symptoms worsen or if the condition fails to improve as anticipated.   Total minutes including chart review and phone contact time: 8   Follow up plan: Return if symptoms worsen or fail to improve.  [name], MD [place]

## 2019-10-09 ENCOUNTER — Ambulatory Visit (INDEPENDENT_AMBULATORY_CARE_PROVIDER_SITE_OTHER): Payer: Medicare HMO | Admitting: Licensed Clinical Social Worker

## 2019-10-09 DIAGNOSIS — E785 Hyperlipidemia, unspecified: Secondary | ICD-10-CM

## 2019-10-09 DIAGNOSIS — I10 Essential (primary) hypertension: Secondary | ICD-10-CM | POA: Diagnosis not present

## 2019-10-09 DIAGNOSIS — E119 Type 2 diabetes mellitus without complications: Secondary | ICD-10-CM | POA: Diagnosis not present

## 2019-10-09 DIAGNOSIS — G43709 Chronic migraine without aura, not intractable, without status migrainosus: Secondary | ICD-10-CM

## 2019-10-09 DIAGNOSIS — F411 Generalized anxiety disorder: Secondary | ICD-10-CM

## 2019-10-09 DIAGNOSIS — J441 Chronic obstructive pulmonary disease with (acute) exacerbation: Secondary | ICD-10-CM | POA: Diagnosis not present

## 2019-10-09 NOTE — Chronic Care Management (AMB) (Signed)
Care Management Note   Yvonne [name] is a 68 y.o. year old female who is a primary care patient of [name], [name], MD. The CM team was consulted for assistance with chronic disease management and care coordination.   I reached out to Yvonne [name] by phone today.    Review of patient status, including review of consultants reports, relevant laboratory and other test results, and collaboration with appropriate care team members and the patient's provider was performed as part of comprehensive patient evaluation and provision of chronic care management services.   Social determinants of health: risk of social isolation; risk of financial strain; risk of stress; risk of physical inactivity    Office Visit from 04/06/2019 in [place]  PHQ-9 Total Score  12     GAD 7 : Generalized Anxiety Score 09/09/2019 12/05/2018 01/22/2018 04/30/2016  Nervous, Anxious, on Edge 0 3 2 1   Control/stop worrying 1 3 3 3   Worry too much - different things 1 3 1 3   Trouble relaxing 1 3 1 3   Restless 1 2 1 2   Easily annoyed or irritable 0 2 1 2   Afraid - awful might happen 0 3 1 0  Total GAD 7 Score 4 19 10 14   Anxiety Difficulty - Extremely difficult - Somewhat difficult   Medications   New medications from outside sources are available for reconciliation   albuterol (VENTOLIN HFA) 108 (90 Base) MCG/ACT inhaler    ALPRAZolam (XANAX) 1 MG tablet    amLODipine (NORVASC) 10 MG tablet    aspirin EC 325 MG tablet    atorvastatin (LIPITOR) 40 MG tablet    clopidogrel (PLAVIX) 75 MG tablet    ezetimibe (ZETIA) 10 MG tablet    fluticasone furoate-vilanterol (BREO ELLIPTA) 100-25 MCG/INH AEPB    furosemide (LASIX) 20 MG tablet    glipiZIDE (GLUCOTROL) 5 MG tablet    HYDROcodone-homatropine (HYCODAN) 5-1.5 MG/5ML syrup    loratadine (CLARITIN) 10 MG tablet    ondansetron (ZOFRAN-ODT) 8 MG disintegrating tablet    pregabalin (LYRICA) 75 MG capsule    sitaGLIPtin (JANUVIA) 100 MG tablet     SitaGLIPtin-MetFORMIN HCl 50-1000 MG TB24    solifenacin (VESICARE) 10 MG tablet    topiramate (TOPAMAX) 200 MG tablet    Goals        . Client stated: "I want to get help in managing stress and anxiety" (pt-stated)     Current Barriers:  [name] Kitchen Mental Health Concerns   . Financial challenges . Spouse of client is ill  Clinical Social Work Clinical Goal(s):  [name] Kitchen Over the next 30 days, [name] will talk with [name] regarding management of anxiety and stress symptoms of client  Interventions: . Encouraged client to use relaxation techniques of choice to manage anxiety and stress symptoms (enjoys listening to Christmas music, watching TV, talking with sister via phone)     Previously encouraged client to talk regularly with [name] to discuss anxiety and stress symptoms experienced by client . Encouraged client to talk with [name] as needed to discuss nursing needs of client  .  Encouraged client to talk with her sister as a means of emotional support  .  Talked with client about upcoming client appointments  Patient Self Care Activities:  . Attends all scheduled provider appointments  . Client speaks daily with her sister for emotional support  .  Client enjoys reading as a way to manage stress   Plan: Client to call [name] as needed to  discuss anxiety and stress issues of client [name] to call client in next 3 weeks to discuss relaxation techniques use of client (reading, gardening in flowers) Client to communicate with [name] to discuss client's nursing needs. Client to participate in self care activities of choice Client to communicate with her sister as a means of emotional support. Client to attend scheduled medical appointments  Initial goal documentation    Follow Up Plan: [name] will call client in next 3 weeks to talk with client about client use of relaxation techniques to help her manage symptoms faced  [name].[name] MSW, [name] Licensed Clinical Social Worker [place] [facility] [phone]

## 2019-10-09 NOTE — Patient Instructions (Addendum)
Licensed Clinical Social Worker Visit Information  Goals we discussed today:  Goals        . Client stated: "I want to get help in managing stress and anxiety" (pt-stated)     Current Barriers:  [name] Kitchen Mental Health Concerns   . Financial challenges . Spouse of client is ill  Clinical Social Work Clinical Goal(s):  [name] Kitchen Over the next 30 days, Tenesa will talk with [name] regarding management of anxiety and stress symptoms of client  Interventions: . Encouraged client to use relaxation techniques of choice to manage anxiety and stress symptoms (enjoys listening to Christmas music, enjoys watching TV, enjoys talking via phone with her sister) . Previously encouraged client to talk regularly with [name] to discuss anxiety and stress symptoms experienced by client . Encouraged client to talk with [name] as needed to discuss nursing needs of client   Encouraged client to talk with her sister as a means of emotional support    Talked with client about upcoming client appointments  Patient Self Care Activities:  . Attends all scheduled provider appointments  . Client speaks daily with her sister for emotional support  .  Client enjoys reading as a way to manage stress   Plan: Client to call [name] as needed to discuss anxiety and stress issues of client [name] to call client in next 3 weeks to discuss relaxation techniques use of client (reading, gardening in flowers) Client to communicate with [name] to discuss client's nursing needs. Client to participate in self care activities of choice Client to communicate with her sister as a means of emotional support. Client to attend scheduled medical appointments  Initial goal documentation            Materials Provided: No  Follow Up Plan:  [name] to call client in next 3 weeks to discuss relaxation techniques use of client  The patient verbalized understanding of instructions provided today and declined a print copy of patient instruction materials.    [name].Jolyn Deshmukh MSW, [name] Licensed Clinical Social Worker [facility] [phone]

## 2019-10-22 DIAGNOSIS — G4733 Obstructive sleep apnea (adult) (pediatric): Secondary | ICD-10-CM | POA: Diagnosis not present

## 2019-11-03 ENCOUNTER — Ambulatory Visit (INDEPENDENT_AMBULATORY_CARE_PROVIDER_SITE_OTHER): Payer: Medicare HMO | Admitting: Licensed Clinical Social Worker

## 2019-11-03 DIAGNOSIS — J441 Chronic obstructive pulmonary disease with (acute) exacerbation: Secondary | ICD-10-CM

## 2019-11-03 DIAGNOSIS — I10 Essential (primary) hypertension: Secondary | ICD-10-CM | POA: Diagnosis not present

## 2019-11-03 DIAGNOSIS — E785 Hyperlipidemia, unspecified: Secondary | ICD-10-CM

## 2019-11-03 DIAGNOSIS — E119 Type 2 diabetes mellitus without complications: Secondary | ICD-10-CM | POA: Diagnosis not present

## 2019-11-03 DIAGNOSIS — F411 Generalized anxiety disorder: Secondary | ICD-10-CM

## 2019-11-03 NOTE — Chronic Care Management (AMB) (Signed)
Care Management Note   Yvonne [name] is a 68 y.o. year old female who is a primary care patient of [name], Yvonne Gash, MD. The CM team was consulted for assistance with chronic disease management and care coordination.   I reached out to Yvonne [name], spouse, by phone today.   Review of patient status, including review of consultants reports, relevant laboratory and other test results, and collaboration with appropriate care team members and the patient's provider was performed as part of comprehensive patient evaluation and provision of chronic care management services.   Social determinants of health: risk of social isolation; risk of financial strain; risk of stress; risk of physical inactivity    Office Visit from 04/06/2019 in [place]  PHQ-9 Total Score  12     GAD 7 : Generalized Anxiety Score 09/09/2019 12/05/2018 01/22/2018 04/30/2016  Nervous, Anxious, on Edge 0 3 2 1   Control/stop worrying 1 3 3 3   Worry too much - different things 1 3 1 3   Trouble relaxing 1 3 1 3   Restless 1 2 1 2   Easily annoyed or irritable 0 2 1 2   Afraid - awful might happen 0 3 1 0  Total GAD 7 Score 4 19 10 14   Anxiety Difficulty - Extremely difficult - Somewhat difficult   Medications   New medications from outside sources are available for reconciliation   albuterol (VENTOLIN HFA) 108 (90 Base) MCG/ACT inhaler    ALPRAZolam (XANAX) 1 MG tablet    amLODipine (NORVASC) 10 MG tablet    aspirin EC 325 MG tablet    atorvastatin (LIPITOR) 40 MG tablet    clopidogrel (PLAVIX) 75 MG tablet    ezetimibe (ZETIA) 10 MG tablet    fluticasone furoate-vilanterol (BREO ELLIPTA) 100-25 MCG/INH AEPB    furosemide (LASIX) 20 MG tablet    glipiZIDE (GLUCOTROL) 5 MG tablet    loratadine (CLARITIN) 10 MG tablet    ondansetron (ZOFRAN-ODT) 8 MG disintegrating tablet    pregabalin (LYRICA) 75 MG capsule    sitaGLIPtin (JANUVIA) 100 MG tablet    SitaGLIPtin-MetFORMIN HCl  50-1000 MG TB24    solifenacin (VESICARE) 10 MG tablet    topiramate (TOPAMAX) 200 MG tablet    Goals        . Client stated: "I want to get help in managing stress and anxiety" (pt-stated)     Current Barriers:  [name] Kitchen Mental Health Concerns of patient with chronic diagnoses of HTN, GAD, Type 2 DM, and Hyperlipidemia . Financial challenges . Spouse of client is ill  Clinical Social Work Clinical Goal(s):  [name] Kitchen Over the next 30 days, Yvonne [name] will talk with [name] regarding management of anxiety and stress symptoms of client  Interventions:  Previously encouraged client to use relaxation techniques of choice to manage anxiety and stress symptoms (enjoys listening to Christmas music, watching TV, talking with sister via phone)     Previously encouraged client to talk regularly with [name] to discuss anxiety and stress symptoms experienced by client  Encouraged client previously to talk with [name] as needed to discuss nursing needs of client    Encouraged client previously to talk with her sister as a means of emotional support    Talked previously with client about upcoming client appointments   Patient Self Care Activities:  . Attends all scheduled provider appointments  . Client speaks daily with her sister for emotional support  .  Client enjoys reading as a way to manage stress  Plan: Client to call [name] as needed to discuss anxiety and stress issues of client [name] to call client in next 3 weeks to discuss relaxation techniques use of client (reading, gardening in flowers) Client to communicate with [name] to discuss client's nursing needs. Client to participate in self care activities of choice Client to communicate with her sister as a means of emotional support. Client to attend scheduled medical appointments  Initial goal documentation      Follow Up Plan: [name] to call client in next 4 weeks to discuss relaxation techniques use of client   Yvonne [name].Yvonne [name] MSW, [name]  Licensed Clinical Social Worker [facility] [phone]

## 2019-11-03 NOTE — Patient Instructions (Addendum)
Licensed Clinical Social Worker Visit Information  Goals we discussed today:    Goals                  . Client stated: "I want to get help in managing stress and anxiety" (pt-stated)       Current Barriers:   Mental Health Concerns of patient with chronic diagnoses of HTN, GAD, Type 2 DM, and Hyperlipidemia  Financial challenges  Spouse of client is ill  Clinical Social Work Clinical Goal(s):   Over the next 30 days, Yvonne [name] will talk with [name] regarding management of anxiety and stress symptoms of client  Interventions:  Previously encouraged client to use relaxation techniques of choice to manage anxiety and stress symptoms(enjoys listening to Christmas music, watching TV, talking with sister via phone)   Previously encouraged client to talk regularly with [name] to discuss anxiety and stress symptoms experienced by client  Encouraged client previously to talk with [name] as needed to discuss nursing needs of client   Encouraged client previously to talk with her sister as a means of emotional support   Talked previously with client about upcoming client appointments   Patient Self Care Activities:   Attends all scheduled provider appointments   Client speaks daily with her sister for emotional support    Client enjoys reading as a way to manage stress   Plan: Client to call [name] as needed to discuss anxiety and stress issues of client [name] to call client in next 3 weeks to discuss relaxation techniques use of client (reading, gardening in flowers) Client to communicate with [name] to discuss client's nursing needs. Client to participate in self care activities of choice Client to communicate with her sister as a means of emotional support. Client to attend scheduled medical appointments  Initial goal documentation      Follow Up Plan: [name] to call client in next 4 weeks to discuss relaxation techniques use of client   Materials Provided:  No  The patient/Yvonne [name], spouse, verbalized understanding of instructions provided today and declined a print copy of patient instruction materials.   [name].Ezrael Sam MSW, [name] Licensed Clinical Social Worker [facility] [phone]

## 2019-11-22 ENCOUNTER — Other Ambulatory Visit: Payer: Self-pay | Admitting: Family Medicine

## 2019-11-23 ENCOUNTER — Telehealth: Payer: Self-pay | Admitting: Family Medicine

## 2019-11-23 NOTE — Telephone Encounter (Signed)
Patient says she needs a refill of the Alprazolam sent to her pharmacy. Pt says she has a med refill appt scheduled with Dr [name] on 12/10/2019 but will run out of the Rx on 11/28/2019.

## 2019-11-23 NOTE — Telephone Encounter (Signed)
Needs to be seen

## 2019-11-23 NOTE — Telephone Encounter (Signed)
Left patient detailed message stacks is out of the office until next Monday. She will need to make sooner appointment okay to make televisit

## 2019-11-28 DIAGNOSIS — M25521 Pain in right elbow: Secondary | ICD-10-CM | POA: Diagnosis not present

## 2019-12-01 ENCOUNTER — Other Ambulatory Visit: Payer: Self-pay | Admitting: Family Medicine

## 2019-12-02 ENCOUNTER — Ambulatory Visit (INDEPENDENT_AMBULATORY_CARE_PROVIDER_SITE_OTHER): Payer: Medicare HMO | Admitting: Licensed Clinical Social Worker

## 2019-12-02 ENCOUNTER — Encounter: Payer: Self-pay | Admitting: Family Medicine

## 2019-12-02 ENCOUNTER — Ambulatory Visit (INDEPENDENT_AMBULATORY_CARE_PROVIDER_SITE_OTHER): Payer: Medicare HMO | Admitting: Family Medicine

## 2019-12-02 DIAGNOSIS — I5032 Chronic diastolic (congestive) heart failure: Secondary | ICD-10-CM

## 2019-12-02 DIAGNOSIS — E119 Type 2 diabetes mellitus without complications: Secondary | ICD-10-CM | POA: Diagnosis not present

## 2019-12-02 DIAGNOSIS — E756 Lipid storage disorder, unspecified: Secondary | ICD-10-CM

## 2019-12-02 DIAGNOSIS — F411 Generalized anxiety disorder: Secondary | ICD-10-CM

## 2019-12-02 DIAGNOSIS — I10 Essential (primary) hypertension: Secondary | ICD-10-CM

## 2019-12-02 DIAGNOSIS — E785 Hyperlipidemia, unspecified: Secondary | ICD-10-CM

## 2019-12-02 DIAGNOSIS — F1320 Sedative, hypnotic or anxiolytic dependence, uncomplicated: Secondary | ICD-10-CM

## 2019-12-02 DIAGNOSIS — E1169 Type 2 diabetes mellitus with other specified complication: Secondary | ICD-10-CM

## 2019-12-02 MED ORDER — ALPRAZOLAM 1 MG PO TABS: 1.0000 mg | ORAL_TABLET | Freq: Three times a day (TID) | ORAL | 5 refills | Status: DC

## 2019-12-02 NOTE — Progress Notes (Signed)
Subjective:    Patient ID: Yvonne [name], female    DOB: 1950-11-29, 69 y.o.   MRN: [id number]   HPI: Yvonne [name] is a 69 y.o. female presenting for feeling a little depressed. Grandaughter on 3 inhalers for CoVID. Cleaned out her parents home and sold it. MEds do help. GAD completed based on not having her xanax.Much better with the medication.   BP was up some through the holidays. Now back in range for normal. Glucose doing well also. No particular numbers shared today. (After discussion of family situation pt. Was not open to detailed discussion otherwise.)   GAD 7 : Generalized Anxiety Score 12/02/2019 09/09/2019 12/05/2018 01/22/2018  Nervous, Anxious, on Edge 2 0 3 2  Control/stop worrying 3 1 3 3   Worry too much - different things 3 1 3 1   Trouble relaxing 3 1 3 1   Restless 3 1 2 1   Easily annoyed or irritable 1 0 2 1  Afraid - awful might happen 0 0 3 1  Total GAD 7 Score 15 4 19 10   Anxiety Difficulty Somewhat difficult - Extremely difficult -         Depression screen [facility] 2/9 09/09/2019 04/06/2019 01/28/2019 01/20/2019 12/05/2018  Decreased Interest 0 2 2 2 2   Down, Depressed, Hopeless 0 1 1 2 2   PHQ - 2 Score 0 3 3 4 4   Altered sleeping - 2 2 2 2   Tired, decreased energy - 2 2 1 2   Change in appetite - 3 3 2 3   Feeling bad or failure about yourself  - 1 1 1 2   Trouble concentrating - 1 1 3 2   Moving slowly or fidgety/restless - 0 2 2 1   Suicidal thoughts - 0 0 0 0  PHQ-9 Score - 12 14 15 16   Some recent data might be hidden     Relevant past medical, surgical, family and social history reviewed and updated as indicated.  Interim medical history since our last visit reviewed. Allergies and medications reviewed and updated.  ROS:  Review of Systems  Constitutional: Negative.   HENT: Negative.  Negative for congestion.   Eyes: Negative for visual disturbance.  Respiratory: Negative for shortness of breath.   Cardiovascular: Negative for chest pain.   Gastrointestinal: Negative for abdominal pain, constipation, diarrhea, nausea and vomiting.  Genitourinary: Negative for difficulty urinating.  Musculoskeletal: Negative for arthralgias and myalgias.  Neurological: Negative for headaches.  Psychiatric/Behavioral: Negative for sleep disturbance.     Social History   Tobacco Use  Smoking Status Never Smoker  Smokeless Tobacco Never Used       Objective:     Wt Readings from Last 3 Encounters:  09/09/19 180 lb (81.6 kg)  06/10/19 180 lb 12.4 oz (82 kg)  04/06/19 182 lb 9.6 oz (82.8 kg)     Exam deferred. Pt. Harboring due to COVID 19. Phone visit performed.   Assessment & Plan:   1. Diabetic lipidosis (HCC)   2. Benzodiazepine dependence, continuous ([place])   3. GAD (generalized anxiety disorder)     Meds ordered this encounter  Medications  . ALPRAZolam (XANAX) 1 MG tablet    Sig: Take 1 tablet (1 mg total) by mouth 3 (three) times daily.    Dispense:  90 tablet    Refill:  5    No orders of the defined types were placed in this encounter.     Diagnoses and all orders for this visit:  Diabetic lipidosis ([place])  Benzodiazepine dependence, continuous (HCC)  GAD (generalized anxiety disorder)  Other orders -     ALPRAZolam (XANAX) 1 MG tablet; Take 1 tablet (1 mg total) by mouth 3 (three) times daily.    Virtual Visit via telephone Note  I discussed the limitations, risks, security and privacy concerns of performing an evaluation and management service by telephone and the availability of in person appointments. The patient was identified with two identifiers. Pt.expressed understanding and agreed to proceed. Pt. Is at home. Dr. [name] is in his office.  Follow Up Instructions:   I discussed the assessment and treatment plan with the patient. The patient was provided an opportunity to ask questions and all were answered. The patient agreed with the plan and demonstrated an understanding of the instructions.    The patient was advised to call back or seek an in-person evaluation if the symptoms worsen or if the condition fails to improve as anticipated.   Total minutes including chart review and phone contact time: 21   Follow up plan: Return in about 6 months (around 05/31/2020).  [name], MD [place]

## 2019-12-02 NOTE — Patient Instructions (Addendum)
Licensed Clinical Social Worker Visit Information  Goals we discussed today:  Goals Addressed            This Visit's Progress   . Client stated: "I want to get help in managing stress and anxiety" (pt-stated)       Current Barriers:  [name] Kitchen Mental Health Concerns  in patient with Chronic Diagnoses of GAD, HTN, Type 2 DM,HLD, CHF . Financial challenges . Spouse of client is ill  Clinical Social Work Clinical Goal(s):  [name] Kitchen Over the next 30 days, [place]le[place] will talk with [name] regarding management of anxiety and stress symptoms of client    Interventions: Provided counseling support for client  Encouraged client to use relaxation techniques of choice to manage anxiety and stress symptoms (enjoys listening to Christmas music, watching TV, talking with sister via phone)  Encouraged client to talk regularly with [name] to discuss anxiety and stress symptoms experienced by client Encouraged client to talk with [name] as needed to discuss nursing needs of client  Encouraged client to talk with her sister as a means of emotional support  Talked with client about upcoming client appointments  Talked with client about sleeping challenges of client  Talked with client about exercise activity (she enjoys walking outdoors with pet dog)  Patient Self Care Activities:  . Attends all scheduled provider appointments  . Client speaks daily with her sister for emotional support  .  Client enjoys reading as a way to manage stress   Plan: Client to call [name] as needed to discuss anxiety and stress issues of client [name] to call client in next 4 weeks to discuss relaxation techniques use of client (reading, gardening in flowers) Client to communicate with [name] to discuss client's nursing needs. Client to participate in self care activities of choice Client to communicate with her sister as a means of emotional support. Client to attend scheduled medical appointments  Initial goal documentation         Materials Provided: No  Follow Up Plan: [name] to call client in next 4 weeks to discuss relaxation techniques use of client  The patient verbalized understanding of instructions provided today and declined a print copy of patient instruction materials.   [name].Cataleyah Colborn MSW, [name] Licensed Clinical Social Worker [facility] [phone]

## 2019-12-02 NOTE — Chronic Care Management (AMB) (Signed)
Care Management Note   Yvonne [name] is a 69 y.o. year old female who is a primary care patient of [name], [name], MD. The CM team was consulted for assistance with chronic disease management and care coordination.   I reached out to Yvonne [name] by phone today.   Review of patient status, including review of consultants reports, relevant laboratory and other test results, and collaboration with appropriate care team members and the patient's provider was performed as part of comprehensive patient evaluation and provision of chronic care management services.   Social Determinants of Health: risk of social isolation; risk of financial strain; risk of stress; risk of physical inactivity    Office Visit from 04/06/2019 in [place]  PHQ-9 Total Score  12      GAD 7 : Generalized Anxiety Score 12/02/2019 09/09/2019 12/05/2018 01/22/2018  Nervous, Anxious, on Edge 2 0 3 2  Control/stop worrying 3 1 3 3   Worry too much - different things 3 1 3 1   Trouble relaxing 3 1 3 1   Restless 3 1 2 1   Easily annoyed or irritable 1 0 2 1  Afraid - awful might happen 0 0 3 1  Total GAD 7 Score 15 4 19 10   Anxiety Difficulty Somewhat difficult - Extremely difficult -   Medications   (very important)  New medications from outside sources are available for reconciliation  albuterol (VENTOLIN HFA) 108 (90 Base) MCG/ACT inhaler ALPRAZolam (XANAX) 1 MG tablet amLODipine (NORVASC) 10 MG tablet aspirin EC 325 MG tablet atorvastatin (LIPITOR) 40 MG tablet clopidogrel (PLAVIX) 75 MG tablet ezetimibe (ZETIA) 10 MG tablet fluticasone furoate-vilanterol (BREO ELLIPTA) 100-25 MCG/INH AEPB furosemide (LASIX) 20 MG tablet glipiZIDE (GLUCOTROL) 5 MG tablet loratadine (CLARITIN) 10 MG tablet ondansetron (ZOFRAN-ODT) 8 MG disintegrating tablet pregabalin (LYRICA) 75 MG capsule sitaGLIPtin (JANUVIA) 100 MG tablet SitaGLIPtin-MetFORMIN HCl 50-1000 MG TB24 solifenacin (VESICARE) 10 MG  tablet topiramate (TOPAMAX) 200 MG tablet  Goals Addressed            This Visit's Progress   . Client stated: "I want to get help in managing stress and anxiety" (pt-stated)       Current Barriers:  [name] Kitchen Mental Health Concerns  in patient with Chronic Diagnoses of GAD, HTN, Type 2 DM,HLD, CHF . Financial challenges . Spouse of client is ill  Clinical Social Work Clinical Goal(s):  [name] Kitchen Over the next 30 days, Yvonne [name] will talk with [name] regarding management of anxiety and stress symptoms of client    Interventions:  Provided counseling support for client  Encouraged client to use relaxation techniques of choice to manage anxiety and stress symptoms(enjoys listening to Christmas music, watching TV, talking with sister via phone)   Encouraged client to talk regularly with [name] to discuss anxiety and stress symptoms experienced by client  Encouraged client to talk with [name] as needed to discuss nursing needs of client   Encouraged client to talk with her sister as a means of emotional support   Talked with client about upcoming client appointments  Talked with client about sleeping challenges of client  Talked with client about exercise activity (she enjoys walking outdoors with pet dog)  Patient Self Care Activities:  . Attends all scheduled provider appointments  . Client speaks daily with her sister for emotional support  .  Client enjoys reading as a way to manage stress   Plan: Client to call [name] as needed to discuss anxiety and stress issues of client [name]  to call client in next 4 weeks to discuss relaxation techniques use of client (reading, gardening in flowers) Client to communicate with [name] to discuss client's nursing needs. Client to participate in self care activities of choice Client to communicate with her sister as a means of emotional support. Client to attend scheduled medical appointments  Initial goal documentation      Follow Up Plan: [name] to  call client in next 4 weeks to discuss relaxation techniques use of client  Yvonne [name].Yvonne [name] MSW, [name] Licensed Clinical Social Worker [facility] [phone]

## 2019-12-10 ENCOUNTER — Ambulatory Visit: Payer: Medicare HMO | Admitting: Family Medicine

## 2019-12-12 ENCOUNTER — Other Ambulatory Visit: Payer: Self-pay | Admitting: Family Medicine

## 2019-12-16 ENCOUNTER — Other Ambulatory Visit: Payer: Self-pay | Admitting: Family Medicine

## 2019-12-16 MED ORDER — SOLIFENACIN SUCCINATE 10 MG PO TABS: 10.0000 mg | ORAL_TABLET | Freq: Every day | ORAL | 0 refills | Status: DC

## 2019-12-16 NOTE — Telephone Encounter (Signed)
Sent!

## 2019-12-31 ENCOUNTER — Ambulatory Visit (INDEPENDENT_AMBULATORY_CARE_PROVIDER_SITE_OTHER): Payer: Medicare HMO | Admitting: Licensed Clinical Social Worker

## 2019-12-31 DIAGNOSIS — F411 Generalized anxiety disorder: Secondary | ICD-10-CM

## 2019-12-31 DIAGNOSIS — E119 Type 2 diabetes mellitus without complications: Secondary | ICD-10-CM

## 2019-12-31 DIAGNOSIS — E785 Hyperlipidemia, unspecified: Secondary | ICD-10-CM | POA: Diagnosis not present

## 2019-12-31 DIAGNOSIS — I5032 Chronic diastolic (congestive) heart failure: Secondary | ICD-10-CM

## 2019-12-31 DIAGNOSIS — I10 Essential (primary) hypertension: Secondary | ICD-10-CM | POA: Diagnosis not present

## 2019-12-31 NOTE — Chronic Care Management (AMB) (Signed)
Care Management Note   Yvonne [name] is a 69 y.o. year old female who is a primary care patient of [name], [name], MD. The CM team was consulted for assistance with chronic disease management and care coordination.   I reached out to Yvonne [name] by phone today.    Review of patient status, including review of consultants reports, relevant laboratory and other test results, and collaboration with appropriate care team members and the patient's provider was performed as part of comprehensive patient evaluation and provision of chronic care management services.   Social determinants of health: risk of tobacco use;risk of depression .   Office Visit from 04/06/2019 in [place]  PHQ-9 Total Score  12     GAD 7 : Generalized Anxiety Score 12/02/2019 09/09/2019 12/05/2018 01/22/2018  Nervous, Anxious, on Edge 2 0 3 2  Control/stop worrying 3 1 3 3   Worry too much - different things 3 1 3 1   Trouble relaxing 3 1 3 1   Restless 3 1 2 1   Easily annoyed or irritable 1 0 2 1  Afraid - awful might happen 0 0 3 1  Total GAD 7 Score 15 4 19 10   Anxiety Difficulty Somewhat difficult - Extremely difficult -   Medications   (very important)  New medications from outside sources are available for reconciliation  albuterol (VENTOLIN HFA) 108 (90 Base) MCG/ACT inhaler ALPRAZolam (XANAX) 1 MG tablet amLODipine (NORVASC) 10 MG tablet atorvastatin (LIPITOR) 40 MG tablet clopidogrel (PLAVIX) 75 MG tablet ezetimibe (ZETIA) 10 MG tablet fluticasone furoate-vilanterol (BREO ELLIPTA) 100-25 MCG/INH AEPB furosemide (LASIX) 20 MG tablet glipiZIDE (GLUCOTROL) 5 MG tablet loratadine (CLARITIN) 10 MG tablet ondansetron (ZOFRAN-ODT) 8 MG disintegrating tablet pregabalin (LYRICA) 75 MG capsule sitaGLIPtin (JANUVIA) 100 MG tablet SitaGLIPtin-MetFORMIN HCl 50-1000 MG TB24 solifenacin (VESICARE) 10 MG tablet topiramate (TOPAMAX) 200 MG tablet  Goals        . Client stated: "I want  to get help in managing stress and anxiety" (pt-stated)     Current Barriers:  [name] Kitchen Mental Health Concerns  in patient with Chronic Diagnoses of GAD, HTN, Type 2 DM,HLD, CHF . Financial challenges . Spouse of client is ill  Clinical Social Work Clinical Goal(s):  [name] Kitchen Over the next 30 days, Yvonne [name] will talk with [name] regarding management of anxiety and stress symptoms of client    Interventions:   Encouraged client to use relaxation techniques of choice to manage anxiety and stress symptoms(enjoys listening to Christmas music, watching TV, talking with sister via phone)   Encouraged client to talk regularly with [name] to discuss anxiety and stress symptoms experienced by client  Encouraged client to talk with [name] as needed to discuss nursing needs of client   Encouraged client to talk with her sister as a means of emotional support   Talked with client about upcoming client appointments  Talked with client about sleeping challenges of client  Talked with client about exercise activity (she enjoys walking outdoors with pet dog)  Patient Self Care Activities:  . Attends all scheduled provider appointments  . Client speaks daily with her sister for emotional support  .  Client enjoys reading as a way to manage stress   Plan: Client to call [name] as needed to discuss anxiety and stress issues of client [name] to call client in next 4 weeks to discuss relaxation techniques use of client (reading, gardening in flowers) Client to communicate with [name] to discuss client's nursing needs. Client to  participate in self care activities of choice Client to communicate with her sister as a means of emotional support. Client to attend scheduled medical appointments  Initial goal documentation         Follow Up Plan:  [name] to call client in next 4 weeks to talk with client about relaxation techniques use of client  Yvonne [name].Yvonne [name] MSW, [name] Licensed Clinical Social Worker [facility] [phone]

## 2019-12-31 NOTE — Patient Instructions (Addendum)
Licensed Clinical Social Worker Visit Information  Goals we discussed today:  Goals        . Client stated: "I want to get help in managing stress and anxiety" (pt-stated)     Current Barriers:  [name] Kitchen Mental Health Concerns  in patient with Chronic Diagnoses of GAD, HTN, Type 2 DM,HLD, CHF . Financial challenges . Spouse of client is ill  Clinical Social Work Clinical Goal(s):  [name] Kitchen Over the next 30 days, [place] will talk with [name] regarding management of anxiety and stress symptoms of client    Interventions:  Encouraged client to use relaxation techniques of choice to manage anxiety and stress symptoms (enjoys listening to Christmas music, watching TV, talking with sister via phone)  Encouraged client to talk regularly with [name] to discuss anxiety and stress symptoms experienced by client Encouraged client to talk with [name] as needed to discuss nursing needs of client  Encouraged client to talk with her sister as a means of emotional support  Talked with client about upcoming client appointments  Talked with client about sleeping challenges of client  Talked with client about exercise activity (she enjoys walking outdoors with pet dog)  Patient Self Care Activities:  . Attends all scheduled provider appointments  . Client speaks daily with her sister for emotional support  .  Client enjoys reading as a way to manage stress   Plan: Client to call [name] as needed to discuss anxiety and stress issues of client [name] to call client in next 4 weeks to discuss relaxation techniques use of client (reading, gardening in flowers) Client to communicate with [name] to discuss client's nursing needs. Client to participate in self care activities of choice Client to communicate with her sister as a means of emotional support. Client to attend scheduled medical appointments  Initial goal documentation          Materials Provided: No  Follow Up Plan: [name] to call client in next 4 weeks to  discuss relaxation techniques use of client  The patient verbalized understanding of instructions provided today and declined a print copy of patient instruction materials.   [name].Affie Gasner MSW, [name] Licensed Clinical Social Worker [facility] [phone]

## 2020-01-12 ENCOUNTER — Other Ambulatory Visit: Payer: Self-pay

## 2020-01-12 ENCOUNTER — Ambulatory Visit (INDEPENDENT_AMBULATORY_CARE_PROVIDER_SITE_OTHER): Payer: Medicare HMO | Admitting: Family

## 2020-01-12 ENCOUNTER — Encounter: Payer: Self-pay | Admitting: Family

## 2020-01-12 DIAGNOSIS — J209 Acute bronchitis, unspecified: Secondary | ICD-10-CM | POA: Diagnosis not present

## 2020-01-12 MED ORDER — BENZONATATE 200 MG PO CAPS: 200.0000 mg | ORAL_CAPSULE | Freq: Three times a day (TID) | ORAL | 1 refills | Status: DC | PRN

## 2020-01-12 MED ORDER — PREDNISONE 10 MG (21) PO TBPK: ORAL_TABLET | ORAL | 0 refills | Status: DC

## 2020-01-12 NOTE — Progress Notes (Signed)
   Virtual Visit via telephone Note Due to COVID-19 pandemic this visit was conducted virtually. This visit type was conducted due to national recommendations for restrictions regarding the COVID-19 Pandemic (e.g. social distancing, sheltering in place) in an effort to limit this patient's exposure and mitigate transmission in our community. All issues noted in this document were discussed and addressed.  A physical exam was not performed with this format.  I connected with Yvonne [name] on 01/12/20 at 11:28 AM by telephone and verified that I am speaking with the correct person using two identifiers. Yvonne [name] is currently located at home and no one  is currently with her during visit. The provider, Evelina Dun, FNP is located in their office at time of visit.  I discussed the limitations, risks, security and privacy concerns of performing an evaluation and management service by telephone and the availability of in person appointments. I also discussed with the patient that there may be a patient responsible charge related to this service. The patient expressed understanding and agreed to proceed.   History and Present Illness:  Cough This is a new problem. The current episode started in the past 7 days. The problem has been gradually worsening. The problem occurs every few minutes. The cough is productive of sputum. Associated symptoms include ear pain, nasal congestion, postnasal drip, rhinorrhea and wheezing. Pertinent negatives include no chills, ear congestion, fever, headaches, myalgias or shortness of breath. Associated symptoms comments: Sinus pressure . She has tried rest and OTC cough suppressant for the symptoms. The treatment provided mild relief. Her past medical history is significant for asthma. There is no history of COPD.      Review of Systems  Constitutional: Negative for chills and fever.  HENT: Positive for ear pain, postnasal drip and rhinorrhea.   Respiratory:  Positive for cough and wheezing. Negative for shortness of breath.   Musculoskeletal: Negative for myalgias.  Neurological: Negative for headaches.  All other systems reviewed and are negative.    Observations/Objective: No SOB or distress, intermittent coarse cough.   Assessment and Plan: 1. Acute bronchitis, unspecified organism Rest Force fluids Mucinex BID Strict low carb diet, check blood glucose and let me know if greater than 250 Call if symptoms worsen or do not improve  - predniSONE (STERAPRED UNI-PAK 21 TAB) 10 MG (21) TBPK tablet; Use as directed  Dispense: 21 tablet; Refill: 0 - benzonatate (TESSALON) 200 MG capsule; Take 1 capsule (200 mg total) by mouth 3 (three) times daily as needed.  Dispense: 30 capsule; Refill: 1     I discussed the assessment and treatment plan with the patient. The patient was provided an opportunity to ask questions and all were answered. The patient agreed with the plan and demonstrated an understanding of the instructions.   The patient was advised to call back or seek an in-person evaluation if the symptoms worsen or if the condition fails to improve as anticipated.  The above assessment and management plan was discussed with the patient. The patient verbalized understanding of and has agreed to the management plan. Patient is aware to call the clinic if symptoms persist or worsen. Patient is aware when to return to the clinic for a follow-up visit. Patient educated on when it is appropriate to go to the emergency department.   Time call ended:  11:38 AM  I provided 10 minutes of non-face-to-face time during this encounter.    Evelina Dun, FNP

## 2020-01-18 ENCOUNTER — Ambulatory Visit (INDEPENDENT_AMBULATORY_CARE_PROVIDER_SITE_OTHER): Payer: Medicare HMO

## 2020-01-18 DIAGNOSIS — Z0000 Encounter for general adult medical examination without abnormal findings: Secondary | ICD-10-CM

## 2020-01-18 NOTE — Progress Notes (Signed)
MEDICARE ANNUAL WELLNESS VISIT  01/18/2020  Telephone Visit Disclaimer This Medicare AWV was conducted by telephone due to national recommendations for restrictions regarding the COVID-19 Pandemic (e.g. social distancing).  I verified, using two identifiers, that I am speaking with Yvonne [name] or their authorized healthcare agent. I discussed the limitations, risks, security, and privacy concerns of performing an evaluation and management service by telephone and the potential availability of an in-person appointment in the future. The patient expressed understanding and agreed to proceed.   Subjective:  Yvonne [name] is a 69 y.o. female patient of [name], [name], MD who had a Medicare Annual Wellness Visit today via telephone. Yvonne [name] is Disabled and lives with their family. she has 3 children. she reports that she is socially active and does interact with friends/family regularly. she is minimally physically active.  Patient Care Team: [name], MD as PCP - General (Family Medicine) Vaughan Basta, Rona Ravens, NP (Inactive) as Nurse Practitioner (Internal Medicine) Pieter Partridge, DO as Consulting Physician (Neurology) Cleon Gustin, MD (Anesthesiology) Shea Evans, [name], [name] as Social Worker (Licensed Clinical Social Worker) Ilean China, RN as Case Manager  Advanced Directives 01/18/2020 06/10/2019 12/12/2018 12/11/2018 12/02/2018 06/06/2018 01/28/2018  Does Patient Have a Medical Advance Directive? No No No No No No No  Would patient like information on creating a medical advance directive? No - Patient declined No - Patient declined No - Patient declined - Yes (MAU/Ambulatory/Procedural Areas - Information given) - No - Patient declined    Hospital Utilization Over the Past 12 Months: # of hospitalizations or ER visits: 0 # of surgeries: 0  Review of Systems    Patient reports that her overall health is unchanged compared to last year.  Patient has a telephone call schedule with  a social worker on 01/27/2020.  Patient Reported Readings (BP, Pulse, CBG, Weight, etc) Patient was in her car and could not tell me her vital signs.  Pain Assessment Pain : No/denies pain     Current Medications & Allergies (verified) Allergies as of 01/18/2020      Reactions   Penicillins Hives   Has patient had a PCN reaction causing immediate rash, facial/tongue/throat swelling, SOB or lightheadedness with hypotension: Yes Has patient had a PCN reaction causing severe rash involving mucus membranes or skin necrosis: Unk Has patient had a PCN reaction that required hospitalization: No Has patient had a PCN reaction occurring within the last 10 years: No If all of the above answers are "NO", then may proceed with Cephalosporin use.   Gabapentin Other (See Comments)   Causes a lot of lethargy at a higher dose   Lisinopril Cough   Soap Rash   DIAL soap      Medication List       Accurate as of January 18, 2020 11:15 AM. If you have any questions, ask your nurse or doctor.        albuterol 108 (90 Base) MCG/ACT inhaler Commonly known as: Ventolin HFA Inhale 2 puffs into the lungs every 6 (six) hours as needed for wheezing or shortness of breath.   ALPRAZolam 1 MG tablet Commonly known as: XANAX Take 1 tablet (1 mg total) by mouth 3 (three) times daily.   amLODipine 10 MG tablet Commonly known as: NORVASC TAKE 1 TABLET (5 MG TOTAL) BY MOUTH DAILY.   atorvastatin 40 MG tablet Commonly known as: LIPITOR TAKE 1 TABLET EVERY DAY AT 6PM   benzonatate 200 MG capsule Commonly known as:  TESSALON Take 1 capsule (200 mg total) by mouth 3 (three) times daily as needed.   clopidogrel 75 MG tablet Commonly known as: PLAVIX Take 1 tablet (75 mg total) by mouth daily with breakfast.   ezetimibe 10 MG tablet Commonly known as: Zetia Take 1 tablet (10 mg total) by mouth daily. For cholesterol   fluticasone furoate-vilanterol 100-25 MCG/INH Aepb Commonly known as: Breo  Ellipta Inhale 1 puff into the lungs daily. What changed:   when to take this  reasons to take this   furosemide 20 MG tablet Commonly known as: LASIX Take 1 tablet (20 mg total) by mouth daily.   glipiZIDE 5 MG tablet Commonly known as: GLUCOTROL Take 1 tablet (5 mg total) by mouth daily before breakfast.   loratadine 10 MG tablet Commonly known as: CLARITIN Take 1 tablet (10 mg total) by mouth daily.   ondansetron 8 MG disintegrating tablet Commonly known as: ZOFRAN-ODT Take 1 tablet (8 mg total) by mouth every 6 (six) hours as needed for nausea or vomiting.   predniSONE 10 MG (21) Tbpk tablet Commonly known as: STERAPRED UNI-PAK 21 TAB Use as directed   pregabalin 75 MG capsule Commonly known as: LYRICA Take 2 capsules by mouth at bedtime.   sitaGLIPtin 100 MG tablet Commonly known as: Januvia Take 1 tablet (100 mg total) by mouth daily.   SitaGLIPtin-MetFORMIN HCl 50-1000 MG Tb24 Take 1 tablet by mouth 2 (two) times daily. For diabetes   solifenacin 10 MG tablet Commonly known as: VESIcare Take 1 tablet (10 mg total) by mouth daily.   topiramate 200 MG tablet Commonly known as: TOPAMAX TAKE 1 TABLET (200 MG TOTAL) BY MOUTH AT BEDTIME.       History (reviewed): Past Medical History:  Diagnosis Date  . Anxiety   . Arthritis    left hand  . Asthma    daily and prn inhalers  . Chronic cough   . Chronic otitis media 12/2017  . Full dentures   . GERD (gastroesophageal reflux disease)   . History of stroke 09/2017   weakness right hand, numbness right side face  . Hyperlipidemia   . Hypertension    states under control with meds., has been on med. x a long time, per pt.  . Non-insulin dependent type 2 diabetes mellitus ([place])   . Overactive bladder   . Recurrent acute suppurative otitis media without spontaneous rupture of left tympanic membrane 02/19/2018  . Transient cerebral ischemia    Past Surgical History:  Procedure Laterality Date  .  ABDOMINAL HYSTERECTOMY     complete  . CATARACT EXTRACTION W/ INTRAOCULAR LENS IMPLANT Right   . CHOLECYSTECTOMY    . COLONOSCOPY N/A 08/17/2015   Procedure: COLONOSCOPY;  Surgeon: Rogene Houston, MD;  Location: [facility];  Service: Endoscopy;  Laterality: N/A;  200 - moved to 7:30 - Ann notified pt  . GAS INSERTION Right    x 2 - eye  . LACRIMAL DUCT EXPLORATION Bilateral    removal of tear ducts  . MYRINGOTOMY WITH TUBE PLACEMENT Bilateral 01/28/2018   Procedure: BILATERAL MYRINGOTOMY WITH TUBE PLACEMENT;  Surgeon: Leta Baptist, MD;  Location: [facility];  Service: ENT;  Laterality: Bilateral;  . VITRECTOMY Left 09/14/2015   Family History  Problem Relation Age of Onset  . Arthritis Mother   . Diabetes Mother   . CVA Mother   . Stroke Mother   . Diabetes Father   . Heart disease Father  CABG.  Does not know age of onset  . Asthma Father   . Hepatitis C Sister   . Arthritis Brother   . Cancer Brother        metastic cancer  . Peripheral Artery Disease Daughter   . Arthritis-Osteo Son    Social History   Socioeconomic History  . Marital status: Married    Spouse name: Not on file  . Number of children: 3  . Years of education: Not on file  . Highest education level: GED or equivalent  Occupational History  . Occupation: retired    Comment: Customer service manager and restaurants  Tobacco Use  . Smoking status: Never Smoker  . Smokeless tobacco: Never Used  Substance and Sexual Activity  . Alcohol use: No  . Drug use: No  . Sexual activity: Yes  Other Topics Concern  . Not on file  Social History Narrative   Lives at home home with husband with son, daughter in law and 3 children.  [name], his girlfriend and her child also moved in 10/2018      Disabled.   Social Determinants of Health   Financial Resource Strain:   . Difficulty of Paying Living Expenses: Not on file  Food Insecurity:   . Worried About Charity fundraiser in the Last Year:  Not on file  . Ran Out of Food in the Last Year: Not on file  Transportation Needs:   . Lack of Transportation (Medical): Not on file  . Lack of Transportation (Non-Medical): Not on file  Physical Activity:   . Days of Exercise per Week: Not on file  . Minutes of Exercise per Session: Not on file  Stress:   . Feeling of Stress : Not on file  Social Connections:   . Frequency of Communication with Friends and Family: Not on file  . Frequency of Social Gatherings with Friends and Family: Not on file  . Attends Religious Services: Not on file  . Active Member of Clubs or Organizations: Not on file  . Attends Archivist Meetings: Not on file  . Marital Status: Not on file    Activities of Daily Living In your present state of health, do you have any difficulty performing the following activities: 01/18/2020 06/10/2019  Hearing? N N  Vision? N N  Difficulty concentrating or making decisions? N N  Walking or climbing stairs? N N  Dressing or bathing? N N  Doing errands, shopping? N N  Preparing Food and eating ? N -  Using the Toilet? N -  In the past six months, have you accidently leaked urine? N -  Do you have problems with loss of bowel control? N -  Managing your Medications? N -  Managing your Finances? N -  Housekeeping or managing your Housekeeping? N -  Some recent data might be hidden    Patient Education/ Literacy How often do you need to have someone help you when you read instructions, pamphlets, or other written materials from your doctor or pharmacy?: 1 - Never What is the last grade level you completed in school?: GED  Exercise Current Exercise Habits: Home exercise routine, Type of exercise: walking, Time (Minutes): 20, Frequency (Times/Week): 3, Weekly Exercise (Minutes/Week): 60, Intensity: Mild  Diet Patient reports consuming 3 meals a day and 3 snack(s) a day Patient reports that her primary diet is: Diabetic Patient reports that she does have  regular access to food.   Depression Screen PHQ 2/9 Scores 01/18/2020  09/09/2019 04/06/2019 01/28/2019 01/20/2019 12/05/2018 12/02/2018  PHQ - 2 Score 0 0 3 3 4 4 5   PHQ- 9 Score - - 12 14 15 16 19      Fall Risk Fall Risk  01/18/2020 09/09/2019 04/06/2019 12/05/2018 12/02/2018  Falls in the past year? 0 1 1 0 0  Number falls in past yr: - 0 1 - -  Injury with Fall? - 0 1 - -  Follow up - - - - -     Objective:  Yvonne [name] seemed alert and oriented and she participated appropriately during our telephone visit.  Blood Pressure Weight BMI  BP Readings from Last 3 Encounters:  09/09/19 137/85  06/12/19 (!) 168/78  04/06/19 (!) 171/92   Wt Readings from Last 3 Encounters:  09/09/19 180 lb (81.6 kg)  06/10/19 180 lb 12.4 oz (82 kg)  04/06/19 182 lb 9.6 oz (82.8 kg)   BMI Readings from Last 1 Encounters:  09/09/19 35.15 kg/m    *Unable to obtain current vital signs, weight, and BMI due to telephone visit type  Hearing/Vision  . Yvonne [name] did not seem to have difficulty with hearing/understanding during the telephone conversation . Reports that she has not had a formal eye exam by an eye care professional within the past year . Reports that she has not had a formal hearing evaluation within the past year *Unable to fully assess hearing and vision during telephone visit type  Cognitive Function: 6CIT Screen 01/18/2020  What Year? 0 points  What month? 0 points  What time? 0 points  Count back from 20 0 points  Months in reverse 0 points  Repeat phrase 0 points  Total Score 0   (Normal:0-7, Significant for Dysfunction: >8)  Normal Cognitive Function Screening: Yes   Immunization & Health Maintenance Record Immunization History  Administered Date(s) Administered  . Influenza Split 08/18/2015  . Influenza,inj,Quad PF,6+ Mos 10/22/2018  . Influenza-Unspecified 09/14/2013  . Pneumococcal Conjugate-13 01/06/2015  . Pneumococcal Polysaccharide-23 10/22/2018  . Tdap 06/06/2017  .  Zoster Recombinat (Shingrix) 06/06/2017, 02/26/2018    Health Maintenance  Topic Date Due  . OPHTHALMOLOGY EXAM  08/26/2016  . FOOT EXAM  06/25/2018  . MAMMOGRAM  04/11/2019  . URINE MICROALBUMIN  10/23/2019  . COLON CANCER SCREENING ANNUAL FOBT  12/04/2019  . INFLUENZA VACCINE  04/15/2020 (Originally 06/27/2019)  . HEMOGLOBIN A1C  03/09/2020  . DEXA SCAN  12/05/2020  . COLONOSCOPY  08/16/2025  . TETANUS/TDAP  06/07/2027  . Hepatitis C Screening  Completed  . PNA vac Low Risk Adult  Completed       Assessment  This is a routine wellness examination for Yvonne [name].  Health Maintenance: Due or Overdue Health Maintenance Due  Topic Date Due  . OPHTHALMOLOGY EXAM  08/26/2016  . FOOT EXAM  06/25/2018  . MAMMOGRAM  04/11/2019  . URINE MICROALBUMIN  10/23/2019  . COLON CANCER SCREENING ANNUAL FOBT  12/04/2019    Yvonne [name] does not need a referral for Community Assistance: Care Management:   no Social Work:    yes Prescription Assistance:  no Nutrition/Diabetes Education:  no   Plan:  Personalized Goals Goals Addressed   None    Personalized Health Maintenance & Screening Recommendations    Lung Cancer Screening Recommended: no (Low Dose CT Chest recommended if Age 73-80 years, 30 pack-year currently smoking OR have quit w/in past 15 years) Hepatitis C Screening recommended: no HIV Screening recommended: no  Advanced Directives: Written information was  not prepared per patient's request.  Referrals & Orders No orders of the defined types were placed in this encounter.   Follow-up Plan . Follow-up with [name], MD as planned . Scheduled with [name] 02/28/2020   I have personally reviewed and noted the following in the patient's chart:   . Medical and social history . Use of alcohol, tobacco or illicit drugs  . Current medications and supplements . Functional ability and status . Nutritional status . Physical activity . Advanced  directives . List of other physicians . Hospitalizations, surgeries, and ER visits in previous 12 months . Vitals . Screenings to include cognitive, depression, and falls . Referrals and appointments  In addition, I have reviewed and discussed with Yvonne [name] certain preventive protocols, quality metrics, and best practice recommendations. A written personalized care plan for preventive services as well as general preventive health recommendations is available and can be mailed to the patient at her request.      Alphonzo Dublin, LPN [phone]

## 2020-01-27 ENCOUNTER — Ambulatory Visit: Payer: Self-pay | Admitting: Licensed Clinical Social Worker

## 2020-01-27 DIAGNOSIS — E785 Hyperlipidemia, unspecified: Secondary | ICD-10-CM

## 2020-01-27 DIAGNOSIS — I5032 Chronic diastolic (congestive) heart failure: Secondary | ICD-10-CM

## 2020-01-27 DIAGNOSIS — E119 Type 2 diabetes mellitus without complications: Secondary | ICD-10-CM

## 2020-01-27 DIAGNOSIS — I10 Essential (primary) hypertension: Secondary | ICD-10-CM

## 2020-01-27 DIAGNOSIS — F411 Generalized anxiety disorder: Secondary | ICD-10-CM

## 2020-01-27 NOTE — Patient Instructions (Addendum)
Licensed Clinical Social Worker Visit Information  Goals we discussed today:  Goals        . Client stated: "I want to get help in managing stress and anxiety" (pt-stated)     Current Barriers:  [name] Kitchen Mental Health Concerns  in patient with Chronic Diagnoses of GAD, HTN, Type 2 DM,HLD, CHF . Financial challenges . Spouse of client is ill  Clinical Social Work Clinical Goal(s):  [name] Kitchen Over the next 30 days, [name] will talk with [name] regarding management of anxiety and stress symptoms of client    Interventions:  Encouraged client previously to use relaxation techniques of choice to manage anxiety and stress symptoms(enjoys listening to music, watching TV, talking with sister via phone)   Previously encouraged client to talk regularly with [name] to discuss anxiety and stress symptoms experienced by client  Previously encouraged client to talk with [name] as needed to discuss nursing needs of client   Previously encouraged clientto talk with her sister as a means of emotional support   Talked with client previously about sleeping challenges of client  Talked with client previously about exercise activity (she enjoys walking outdoors with pet dog)  Patient Self Care Activities:  . Attends all scheduled provider appointments  . Client speaks daily with her sister for emotional support  .  Client enjoys reading as a way to manage stress   Plan: Client to call [name] as needed to discuss anxiety and stress issues of client [name] to call client in next 4 weeks to discuss relaxation techniques use of client (reading, gardening in flowers) Client to communicate with [name] to discuss client's nursing needs. Client to participate in self care activities of choice Client to communicate with her sister as a means of emotional support. Client to attend scheduled medical appointments  Initial goal documentation      Materials Provided: No  Follow Up Plan:  [name] to call client in next 4 weeks to  discuss relaxation techniques use by client (she enjoys reading, gardening in flowers)  The patient verbalized understanding of instructions provided today and declined a print copy of patient instruction materials.   [name].Skyley Grandmaison MSW, [name] Licensed Clinical Social Worker [facility] [phone]

## 2020-01-27 NOTE — Chronic Care Management (AMB) (Signed)
Care Management Note   Yvonne [name] is a 69 y.o. year old female who is a primary care patient of [name], [name], MD. The CM team was consulted for assistance with chronic disease management and care coordination.   I reached out to [name] by phone today.   Review of patient status, including review of consultants reports, relevant laboratory and other test results, and collaboration with appropriate care team members and the patient's provider was performed as part of comprehensive patient evaluation and provision of chronic care management services.   Social determinants of health:risk of tobacco use; risk of depression    Office Visit from 04/06/2019 in [place]  PHQ-9 Total Score  12     GAD 7 : Generalized Anxiety Score 12/02/2019 09/09/2019 12/05/2018 01/22/2018  Nervous, Anxious, on Edge 2 0 3 2  Control/stop worrying 3 1 3 3   Worry too much - different things 3 1 3 1   Trouble relaxing 3 1 3 1   Restless 3 1 2 1   Easily annoyed or irritable 1 0 2 1  Afraid - awful might happen 0 0 3 1  Total GAD 7 Score 15 4 19 10   Anxiety Difficulty Somewhat difficult - Extremely difficult -   Medications   (very important)  New medications from outside sources are available for reconciliation  albuterol (VENTOLIN HFA) 108 (90 Base) MCG/ACT inhaler ALPRAZolam (XANAX) 1 MG tablet amLODipine (NORVASC) 10 MG tablet atorvastatin (LIPITOR) 40 MG tablet benzonatate (TESSALON) 200 MG capsule clopidogrel (PLAVIX) 75 MG tablet ezetimibe (ZETIA) 10 MG tablet fluticasone furoate-vilanterol (BREO ELLIPTA) 100-25 MCG/INH AEPB furosemide (LASIX) 20 MG tablet glipiZIDE (GLUCOTROL) 5 MG tablet loratadine (CLARITIN) 10 MG tablet ondansetron (ZOFRAN-ODT) 8 MG disintegrating tablet sitaGLIPtin (JANUVIA) 100 MG tablet solifenacin (VESICARE) 10 MG tablet  Goals        . Client stated: "I want to get help in managing stress and anxiety" (pt-stated)     Current Barriers:   [name] Kitchen Mental Health Concerns  in patient with Chronic Diagnoses of GAD, HTN, Type 2 DM,HLD, CHF . Financial challenges . Spouse of client is ill  Clinical Social Work Clinical Goal(s):  [name] Kitchen Over the next 30 days, Serenitee will talk with [name] regarding management of anxiety and stress symptoms of client    Interventions:  Encouraged client previously to use relaxation techniques of choice to manage anxiety and stress symptoms(enjoys listening to music, watching TV, talking with sister via phone)   Previously encouraged client to talk regularly with [name] to discuss anxiety and stress symptoms experienced by client  Previously encouraged client to talk with [name] as needed to discuss nursing needs of client   Previously encouraged clientto talk with her sister as a means of emotional support   Talked with client previously about sleeping challenges of client  Talked with client previously about exercise activity (she enjoys walking outdoors with pet dog)  Patient Self Care Activities:  . Attends all scheduled provider appointments  . Client speaks daily with her sister for emotional support  .  Client enjoys reading as a way to manage stress   Plan: Client to call [name] as needed to discuss anxiety and stress issues of client [name] to call client in next 4 weeks to discuss relaxation techniques use of client (reading, gardening in flowers) Client to communicate with [name] to discuss client's nursing needs. Client to participate in self care activities of choice Client to communicate with her sister as a means  of emotional support. Client to attend scheduled medical appointments  Initial goal documentation    Follow Up Plan: [name] to call client in next 4 weeks to talk with client about relaxation techniques use by client (enjoys reading, gardening in flowers,taking care of her pet dog)  [name].Gulianna Hornsby MSW, [name] Licensed Clinical Social Worker [facility]  Care Management [phone]

## 2020-01-28 DIAGNOSIS — Z0100 Encounter for examination of eyes and vision without abnormal findings: Secondary | ICD-10-CM | POA: Diagnosis not present

## 2020-02-02 ENCOUNTER — Ambulatory Visit: Payer: Medicare HMO | Admitting: *Deleted

## 2020-02-02 NOTE — Chronic Care Management (AMB) (Signed)
  Chronic Care Management   Outreach Note  02/02/2020 Name: Yvonne [name] MRN: [id number] DOB: 02/17/1951  Referred by: [name], MD Reason for referral : Chronic Care Management (RN Follow up)   An unsuccessful telephone follow-up was attempted today. The patient was referred to the case management team for assistance with care management and care coordination.   Follow Up Plan: A HIPPA compliant phone message was left for the patient providing contact information and requesting a return call.  The care management team will reach out to the patient again over the next 45 days.   [name], BSN, RN-BC Embedded Chronic Care Manager [facility] [place] Management Direct Dial: [phone]

## 2020-02-24 ENCOUNTER — Ambulatory Visit (INDEPENDENT_AMBULATORY_CARE_PROVIDER_SITE_OTHER): Payer: Medicare HMO | Admitting: Licensed Clinical Social Worker

## 2020-02-24 DIAGNOSIS — I5032 Chronic diastolic (congestive) heart failure: Secondary | ICD-10-CM

## 2020-02-24 DIAGNOSIS — E785 Hyperlipidemia, unspecified: Secondary | ICD-10-CM

## 2020-02-24 DIAGNOSIS — I10 Essential (primary) hypertension: Secondary | ICD-10-CM

## 2020-02-24 DIAGNOSIS — E119 Type 2 diabetes mellitus without complications: Secondary | ICD-10-CM

## 2020-02-24 DIAGNOSIS — F411 Generalized anxiety disorder: Secondary | ICD-10-CM

## 2020-02-24 NOTE — Patient Instructions (Addendum)
Licensed Clinical Social Worker Visit Information  Goals we discussed today:  Goals        . Client stated: "I want to get help in managing stress and anxiety" (pt-stated)     Current Barriers:  [name] Kitchen Mental Health Concerns  in patient with Chronic Diagnoses of GAD, HTN, Type 2 DM,HLD, CHF . Financial challenges . Spouse of client is ill  Clinical Social Work Clinical Goal(s):  [name] Kitchen Over the next 30 days, Yvonne [name] will talk with [name] regarding management of anxiety and stress symptoms of client    Interventions:   Encouraged client touse relaxation techniques of choice to manage anxiety and stress symptoms(enjoys listening to music, watching TV, talking with sister via phone)   Previously encouraged client to talk regularly with [name] to discuss anxiety and stress symptoms experienced by client  Encouraged client to talk with [name] as needed to discuss nursing needs of client  Previously encouraged clientto talk with her sister as a means of emotional support   Talked with clientabout sleeping challenges of client    Talked with clientabout exercise activity (she enjoys walking outdoors with pet dog)  Patient Self Care Activities:  . Attends all scheduled provider appointments  . Client speaks daily with her sister for emotional support  .  Client enjoys reading as a way to manage stress   Plan: Client to call [name] as needed to discuss anxiety and stress issues of client [name] to call client in next 4 weeks to discuss relaxation techniques use of client (reading, gardening in flowers) Client to communicate with [name] to discuss client's nursing needs. Client to participate in self care activities of choice Client to communicate with her sister as a means of emotional support. Client to attend scheduled medical appointments  Initial goal documentation       Materials Provided: No  Follow Up Plan: [name] to call client in next 4 weeks to discuss relaxation techniques use of  client  The patient verbalized understanding of instructions provided today and declined a print copy of patient instruction materials.   [name].[name] MSW, [name] Licensed Clinical Social Worker [facility] Care Management [phone]

## 2020-02-24 NOTE — Chronic Care Management (AMB) (Signed)
Care Management Note   Yvonne [name] is a 69 y.o. year old female who is a primary care patient of [name], [name], MD. The CM team was consulted for assistance with chronic disease management and care coordination.   I reached out to [name] by phone today. [name] did speak with client today via phone  Review of patient status, including review of consultants reports, relevant laboratory and other test results, and collaboration with appropriate care team members and the patient's provider was performed as part of comprehensive patient evaluation and provision of chronic care management services.   Social determinants of health: risk of tobacco use; risk of depression    Office Visit from 04/06/2019 in [place]  PHQ-9 Total Score  12     GAD 7 : Generalized Anxiety Score 12/02/2019 09/09/2019 12/05/2018 01/22/2018  Nervous, Anxious, on Edge 2 0 3 2  Control/stop worrying 3 1 3 3   Worry too much - different things 3 1 3 1   Trouble relaxing 3 1 3 1   Restless 3 1 2 1   Easily annoyed or irritable 1 0 2 1  Afraid - awful might happen 0 0 3 1  Total GAD 7 Score 15 4 19 10   Anxiety Difficulty Somewhat difficult - Extremely difficult -   Medications   (very important)  New medications from outside sources are available for reconciliation  albuterol (VENTOLIN HFA) 108 (90 Base) MCG/ACT inhaler ALPRAZolam (XANAX) 1 MG tablet amLODipine (NORVASC) 10 MG tablet atorvastatin (LIPITOR) 40 MG tablet benzonatate (TESSALON) 200 MG capsule clopidogrel (PLAVIX) 75 MG tablet ezetimibe (ZETIA) 10 MG tablet fluticasone furoate-vilanterol (BREO ELLIPTA) 100-25 MCG/INH AEPB furosemide (LASIX) 20 MG tablet glipiZIDE (GLUCOTROL) 5 MG tablet loratadine (CLARITIN) 10 MG tablet ondansetron (ZOFRAN-ODT) 8 MG disintegrating tablet sitaGLIPtin (JANUVIA) 100 MG tablet solifenacin (VESICARE) 10 MG tablet  Goals        . Client stated: "I want to get help in managing stress and  anxiety" (pt-stated)     Current Barriers:  [name] Kitchen Mental Health Concerns  in patient with Chronic Diagnoses of GAD, HTN, Type 2 DM,HLD, CHF . Financial challenges . Spouse of client is ill  Clinical Social Work Clinical Goal(s):  [name] Kitchen Over the next 30 days, [place] will talk with [name] regarding management of anxiety and stress symptoms of client    Interventions:  Encouraged client to use relaxation techniques of choice to manage anxiety and stress symptoms(enjoys listening to music, watching TV, talking with sister via phone)   Previously encouraged client to talk regularly with [name] to discuss anxiety and stress symptoms experienced by client  Encouraged client to talk with [name] as needed to discuss nursing needs of client   Previously encouraged clientto talk with her sister as a means of emotional support   Talked with client about sleeping challenges of client  Talked with client about exercise activity (she enjoys walking outdoors with pet dog)  Patient Self Care Activities:  . Attends all scheduled provider appointments  . Client speaks daily with her sister for emotional support  .  Client enjoys reading as a way to manage stress   Plan: Client to call [name] as needed to discuss anxiety and stress issues of client [name] to call client in next 4 weeks to discuss relaxation techniques use of client (reading, gardening in flowers) Client to communicate with [name] to discuss client's nursing needs. Client to participate in self care activities of choice Client to communicate with her  sister as a means of emotional support. Client to attend scheduled medical appointments  Initial goal documentation    Follow Up Plan: [name] to call client in next 4 weeks to discuss relaxation techniques use of client  [name].Joshva Labreck MSW, [name] Licensed Clinical Social Worker [facility] Care Management [phone]

## 2020-03-01 ENCOUNTER — Encounter: Payer: Self-pay | Admitting: Family Medicine

## 2020-03-01 ENCOUNTER — Ambulatory Visit (INDEPENDENT_AMBULATORY_CARE_PROVIDER_SITE_OTHER): Payer: Medicare HMO | Admitting: Family Medicine

## 2020-03-01 ENCOUNTER — Other Ambulatory Visit: Payer: Self-pay

## 2020-03-01 VITALS — BP 139/83 | HR 65 | Temp 98.0°F | Ht 60.0 in | Wt 187.0 lb

## 2020-03-01 DIAGNOSIS — E876 Hypokalemia: Secondary | ICD-10-CM

## 2020-03-01 DIAGNOSIS — I10 Essential (primary) hypertension: Secondary | ICD-10-CM | POA: Diagnosis not present

## 2020-03-01 DIAGNOSIS — E1169 Type 2 diabetes mellitus with other specified complication: Secondary | ICD-10-CM

## 2020-03-01 DIAGNOSIS — M47816 Spondylosis without myelopathy or radiculopathy, lumbar region: Secondary | ICD-10-CM

## 2020-03-01 DIAGNOSIS — E756 Lipid storage disorder, unspecified: Secondary | ICD-10-CM | POA: Diagnosis not present

## 2020-03-01 DIAGNOSIS — F411 Generalized anxiety disorder: Secondary | ICD-10-CM

## 2020-03-01 DIAGNOSIS — E785 Hyperlipidemia, unspecified: Secondary | ICD-10-CM

## 2020-03-01 DIAGNOSIS — F1320 Sedative, hypnotic or anxiolytic dependence, uncomplicated: Secondary | ICD-10-CM | POA: Diagnosis not present

## 2020-03-01 DIAGNOSIS — E1142 Type 2 diabetes mellitus with diabetic polyneuropathy: Secondary | ICD-10-CM

## 2020-03-01 DIAGNOSIS — E119 Type 2 diabetes mellitus without complications: Secondary | ICD-10-CM | POA: Diagnosis not present

## 2020-03-01 LAB — CBC WITH DIFFERENTIAL/PLATELET
Basophils Absolute: 0 10*3/uL (ref 0.0–0.2)
Basos: 1 %
EOS (ABSOLUTE): 0.2 10*3/uL (ref 0.0–0.4)
Eos: 3 %
Hematocrit: 39 % (ref 34.0–46.6)
Hemoglobin: 13.1 g/dL (ref 11.1–15.9)
Immature Grans (Abs): 0 10*3/uL (ref 0.0–0.1)
Immature Granulocytes: 0 %
Lymphocytes Absolute: 3.1 10*3/uL (ref 0.7–3.1)
Lymphs: 45 %
MCH: 29.1 pg (ref 26.6–33.0)
MCHC: 33.6 g/dL (ref 31.5–35.7)
MCV: 87 fL (ref 79–97)
Monocytes Absolute: 0.5 10*3/uL (ref 0.1–0.9)
Monocytes: 7 %
Neutrophils Absolute: 3 10*3/uL (ref 1.4–7.0)
Neutrophils: 44 %
Platelets: 256 10*3/uL (ref 150–450)
RBC: 4.5 x10E6/uL (ref 3.77–5.28)
RDW: 12.8 % (ref 11.7–15.4)
WBC: 6.9 10*3/uL (ref 3.4–10.8)

## 2020-03-01 LAB — CMP14+EGFR
ALT: 29 IU/L (ref 0–32)
AST: 23 IU/L (ref 0–40)
Albumin/Globulin Ratio: 1.9 (ref 1.2–2.2)
Albumin: 4 g/dL (ref 3.8–4.8)
Alkaline Phosphatase: 129 IU/L — ABNORMAL HIGH (ref 39–117)
BUN/Creatinine Ratio: 19 (ref 12–28)
BUN: 19 mg/dL (ref 8–27)
Bilirubin Total: 0.4 mg/dL (ref 0.0–1.2)
CO2: 24 mmol/L (ref 20–29)
Calcium: 9.3 mg/dL (ref 8.7–10.3)
Chloride: 103 mmol/L (ref 96–106)
Creatinine, Ser: 0.99 mg/dL (ref 0.57–1.00)
GFR calc Af Amer: 67 mL/min/{1.73_m2} (ref >59)
GFR calc non Af Amer: 58 mL/min/{1.73_m2} — ABNORMAL LOW (ref >59)
Globulin, Total: 2.1 g/dL (ref 1.5–4.5)
Glucose: 174 mg/dL — ABNORMAL HIGH (ref 65–99)
Potassium: 4.7 mmol/L (ref 3.5–5.2)
Sodium: 140 mmol/L (ref 134–144)
Total Protein: 6.1 g/dL (ref 6.0–8.5)

## 2020-03-01 LAB — LIPID PANEL
Chol/HDL Ratio: 3.5 ratio (ref 0.0–4.4)
Cholesterol, Total: 166 mg/dL (ref 100–199)
HDL: 48 mg/dL (ref >39)
LDL Chol Calc (NIH): 97 mg/dL (ref 0–99)
Triglycerides: 114 mg/dL (ref 0–149)
VLDL Cholesterol Cal: 21 mg/dL (ref 5–40)

## 2020-03-01 LAB — BAYER DCA HB A1C WAIVED: HB A1C (BAYER DCA - WAIVED): 9.2 % — ABNORMAL HIGH (ref <7.0)

## 2020-03-01 NOTE — Progress Notes (Signed)
Subjective:  Patient ID: Yvonne [name],  female    DOB: 09-03-1951  Age: 69 y.o.    CC: Follow-up (3 month)   HPI TANEAH MASRI presents for  follow-up of hypertension. Patient has no history of headache chest pain or shortness of breath or recent cough. Patient also denies symptoms of TIA such as numbness weakness lateralizing. Patient denies side effects from medication. States taking it regularly.  Patient also  in for follow-up of elevated cholesterol. Doing well without complaints on current medication. Denies side effects  including myalgia and arthralgia and nausea. Also in today for liver function testing. Currently no chest pain, shortness of breath or other cardiovascular related symptoms noted.  Follow-up of diabetes. Patient does check blood sugar at home. Readings run between 150 and 200.  Admits that she is eating a lot of junk food.  She has gotten off track with her diet.  She would like to see a nutritionist. Patient denies symptoms such as excessive hunger or urinary frequency, excessive hunger, nausea.  She reports that she does have some numbness and tingling in the feet. No significant hypoglycemic spells noted. Medications reviewed. Pt reports taking them regularly. Pt. denies complication/adverse reaction today.  Patient says that she recently saw her diabetes and eye care specialist.   History Meygan has a past medical history of Anxiety, Arthritis, Asthma, Chronic cough, Chronic otitis media (12/2017), Full dentures, GERD (gastroesophageal reflux disease), History of stroke (09/2017), Hyperlipidemia, Hypertension, Non-insulin dependent type 2 diabetes mellitus ([place]), Overactive bladder, Recurrent acute suppurative otitis media without spontaneous rupture of left tympanic membrane (02/19/2018), and Transient cerebral ischemia.   She has a past surgical history that includes Cholecystectomy; Colonoscopy (N/A, 08/17/2015); Abdominal hysterectomy; Lacrimal duct exploration  (Bilateral); Cataract extraction w/ intraocular lens implant (Right); Vitrectomy (Left, 09/14/2015); Gas insertion (Right); and Myringotomy with tube placement (Bilateral, 01/28/2018).   Her family history includes Arthritis in her brother and mother; Arthritis-Osteo in her son; Asthma in her father; CVA in her mother; Cancer in her brother; Diabetes in her father and mother; Heart disease in her father; Hepatitis C in her sister; Peripheral Artery Disease in her daughter; Stroke in her mother.She reports that she has never smoked. She has never used smokeless tobacco. She reports that she does not drink alcohol or use drugs.  Current Outpatient Medications on File Prior to Visit  Medication Sig Dispense Refill  . albuterol (VENTOLIN HFA) 108 (90 Base) MCG/ACT inhaler Inhale 2 puffs into the lungs every 6 (six) hours as needed for wheezing or shortness of breath. 18 Inhaler 5  . ALPRAZolam (XANAX) 1 MG tablet Take 1 tablet (1 mg total) by mouth 3 (three) times daily. 90 tablet 5  . amLODipine (NORVASC) 10 MG tablet TAKE 1 TABLET (5 MG TOTAL) BY MOUTH DAILY. 90 tablet 1  . aspirin 325 MG tablet Take 325 mg by mouth daily.    [name] Kitchen atorvastatin (LIPITOR) 40 MG tablet TAKE 1 TABLET EVERY DAY AT 6PM 90 tablet 1  . clopidogrel (PLAVIX) 75 MG tablet Take 1 tablet (75 mg total) by mouth daily with breakfast. 7 tablet 0  . ezetimibe (ZETIA) 10 MG tablet Take 1 tablet (10 mg total) by mouth daily. For cholesterol 90 tablet 1  . fluticasone furoate-vilanterol (BREO ELLIPTA) 100-25 MCG/INH AEPB Inhale 1 puff into the lungs daily. (Patient taking differently: Inhale 1 puff into the lungs daily as needed (for shortness of breath). ) 180 each 5  . furosemide (LASIX) 20 MG tablet  Take 1 tablet (20 mg total) by mouth daily. 90 tablet 1  . glipiZIDE (GLUCOTROL) 5 MG tablet Take 1 tablet (5 mg total) by mouth daily before breakfast. 90 tablet 0  . loratadine (CLARITIN) 10 MG tablet Take 1 tablet (10 mg total) by mouth  daily. 90 tablet 3  . sitaGLIPtin (JANUVIA) 100 MG tablet Take 1 tablet (100 mg total) by mouth daily. 30 tablet 2  . solifenacin (VESICARE) 10 MG tablet Take 1 tablet (10 mg total) by mouth daily. 90 tablet 0  . benzonatate (TESSALON) 200 MG capsule Take 1 capsule (200 mg total) by mouth 3 (three) times daily as needed. (Patient not taking: Reported on 03/01/2020) 30 capsule 1  . ondansetron (ZOFRAN-ODT) 8 MG disintegrating tablet Take 1 tablet (8 mg total) by mouth every 6 (six) hours as needed for nausea or vomiting. (Patient not taking: Reported on 03/01/2020) 20 tablet 1   No current facility-administered medications on file prior to visit.    ROS Review of Systems  Constitutional: Negative.   HENT: Negative for congestion.   Eyes: Negative for visual disturbance.  Respiratory: Negative for shortness of breath.   Cardiovascular: Negative for chest pain.  Gastrointestinal: Negative for abdominal pain, constipation, diarrhea, nausea and vomiting.  Genitourinary: Negative for difficulty urinating.  Musculoskeletal: Positive for arthralgias. Negative for myalgias.  Neurological: Negative for headaches.  Psychiatric/Behavioral: Negative for sleep disturbance.    Objective:  BP 139/83   Pulse 65   Temp 98 F (36.7 C) (Temporal)   Ht 5' (1.524 m)   Wt 187 lb (84.8 kg)   BMI 36.52 kg/m   BP Readings from Last 3 Encounters:  03/01/20 139/83  09/09/19 137/85  06/12/19 (!) 168/78    Wt Readings from Last 3 Encounters:  03/01/20 187 lb (84.8 kg)  09/09/19 180 lb (81.6 kg)  06/10/19 180 lb 12.4 oz (82 kg)     Physical Exam Constitutional:      General: She is not in acute distress.    Appearance: She is well-developed.  HENT:     Head: Normocephalic and atraumatic.     Right Ear: External ear normal.     Left Ear: External ear normal.     Nose: Nose normal.  Eyes:     Conjunctiva/sclera: Conjunctivae normal.     Pupils: Pupils are equal, round, and reactive to light.   Neck:     Thyroid: No thyromegaly.  Cardiovascular:     Rate and Rhythm: Normal rate and regular rhythm.     Heart sounds: Normal heart sounds. No murmur.  Pulmonary:     Effort: Pulmonary effort is normal. No respiratory distress.     Breath sounds: Normal breath sounds. No wheezing or rales.  Abdominal:     General: Bowel sounds are normal. There is no distension.     Palpations: Abdomen is soft.     Tenderness: There is no abdominal tenderness.  Musculoskeletal:     Cervical back: Normal range of motion and neck supple.  Lymphadenopathy:     Cervical: No cervical adenopathy.  Skin:    General: Skin is warm and dry.  Neurological:     Mental Status: She is alert and oriented to person, place, and time.     Deep Tendon Reflexes: Reflexes are normal and symmetric.  Psychiatric:        Behavior: Behavior normal.        Thought Content: Thought content normal.        Judgment:  Judgment normal.     Diabetic Foot Exam - Simple   Simple Foot Form Diabetic Foot exam was performed with the following findings: Yes 03/01/2020  8:25 AM  Visual Inspection No deformities, no ulcerations, no other skin breakdown bilaterally: Yes Sensation Testing See comments: Yes Pulse Check Posterior Tibialis and Dorsalis pulse intact bilaterally: Yes Comments  Intact for all sites of neurologic foot exam with the exception of the ball of the foot bilaterally       Assessment & Plan:   Rielyn was seen today for follow-up.  Diagnoses and all orders for this visit:  Diabetic polyneuropathy associated with type 2 diabetes mellitus ([place]) -     CBC with Differential/Platelet -     CMP14+EGFR -     Microalbumin / creatinine urine ratio -     Bayer DCA Hb A1c Waived  Essential hypertension -     CBC with Differential/Platelet -     CMP14+EGFR -     Microalbumin / creatinine urine ratio -     Bayer DCA Hb A1c Waived  Hyperlipidemia with target LDL less than 70 -     CBC with  Differential/Platelet -     CMP14+EGFR -     Microalbumin / creatinine urine ratio -     Bayer DCA Hb A1c Waived -     Lipid panel  Diabetic lipidosis ([facility]) -     CBC with Differential/Platelet -     CMP14+EGFR -     Microalbumin / creatinine urine ratio -     Bayer DCA Hb A1c Waived -     Lipid panel  GAD (generalized anxiety disorder)  Hypokalemia  Spondylosis of lumbar region without myelopathy or radiculopathy   I am having [name] maintain her albuterol, fluticasone furoate-vilanterol, loratadine, ondansetron, clopidogrel, amLODipine, ezetimibe, furosemide, glipiZIDE, sitaGLIPtin, atorvastatin, ALPRAZolam, solifenacin, benzonatate, and aspirin.  No orders of the defined types were placed in this encounter.    Follow-up: Return in about 3 months (around 05/31/2020).  [name], M.D.

## 2020-03-02 ENCOUNTER — Other Ambulatory Visit: Payer: Self-pay

## 2020-03-02 ENCOUNTER — Other Ambulatory Visit: Payer: Self-pay | Admitting: *Deleted

## 2020-03-02 ENCOUNTER — Other Ambulatory Visit: Payer: Medicare HMO

## 2020-03-02 DIAGNOSIS — F411 Generalized anxiety disorder: Secondary | ICD-10-CM | POA: Diagnosis not present

## 2020-03-02 DIAGNOSIS — F1320 Sedative, hypnotic or anxiolytic dependence, uncomplicated: Secondary | ICD-10-CM | POA: Diagnosis not present

## 2020-03-02 DIAGNOSIS — Z79891 Long term (current) use of opiate analgesic: Secondary | ICD-10-CM | POA: Diagnosis not present

## 2020-03-02 LAB — MICROALBUMIN / CREATININE URINE RATIO
Creatinine, Urine: 134.2 mg/dL
Microalb/Creat Ratio: 6 mg/g creat (ref 0–29)
Microalbumin, Urine: 8.5 ug/mL

## 2020-03-02 NOTE — Progress Notes (Signed)
Hello Otisha,  Your lab result is normal and/or stable.Some minor variations that are not significant are commonly marked abnormal, but do not represent any medical problem for you.  Best regards, Breyton Vanscyoc, M.D.

## 2020-03-02 NOTE — Addendum Note (Signed)
Addended by: Earlene Plater on: 03/02/2020 11:11 AM   Modules accepted: Orders

## 2020-03-05 LAB — TOXASSURE SELECT 13 (MW), URINE

## 2020-03-06 ENCOUNTER — Other Ambulatory Visit: Payer: Self-pay | Admitting: Family Medicine

## 2020-03-07 ENCOUNTER — Other Ambulatory Visit: Payer: Self-pay | Admitting: Family Medicine

## 2020-03-07 ENCOUNTER — Telehealth: Payer: Self-pay | Admitting: *Deleted

## 2020-03-07 NOTE — Telephone Encounter (Signed)
Aware of esults.  A1C is nine .  She says provider was going to do referral for dietician?

## 2020-03-07 NOTE — Telephone Encounter (Signed)
I wanted to find out if this was something our new pharmacist will do? If so, please set up with her. If not, let me know and I will make the referral. Thanks, WS

## 2020-03-07 NOTE — Telephone Encounter (Signed)
Patient has a follow up appointment scheduled with pharm D here.

## 2020-03-09 ENCOUNTER — Ambulatory Visit (INDEPENDENT_AMBULATORY_CARE_PROVIDER_SITE_OTHER): Payer: Medicare HMO | Admitting: *Deleted

## 2020-03-09 DIAGNOSIS — E119 Type 2 diabetes mellitus without complications: Secondary | ICD-10-CM

## 2020-03-09 DIAGNOSIS — I10 Essential (primary) hypertension: Secondary | ICD-10-CM

## 2020-03-09 NOTE — Chronic Care Management (AMB) (Addendum)
Chronic Care Management   Follow Up Note   03/09/2020 Name: Yvonne [name] MRN: [id number] DOB: 1951-08-06  Referred by: [name], MD Reason for referral : Chronic Care Management (RN Follow up)   Yvonne [name] is a 69 y.o. year old female who is a primary care patient of [name], [name], MD. The CCM team was consulted for assistance with chronic disease management and care coordination needs.    Review of patient status, including review of consultants reports, relevant laboratory and other test results, and collaboration with appropriate care team members and the patient's provider was performed as part of comprehensive patient evaluation and provision of chronic care management services.    SDOH (Social Determinants of Health) assessments performed: No See Care Plan activities for detailed interventions related to [facility])    I spoke with Yvonne [name] by telephone today regarding management of her chronic medical conditions.   Outpatient Encounter Medications as of 03/09/2020  Medication Sig  . albuterol (VENTOLIN HFA) 108 (90 Base) MCG/ACT inhaler Inhale 2 puffs into the lungs every 6 (six) hours as needed for wheezing or shortness of breath.  . ALPRAZolam (XANAX) 1 MG tablet Take 1 tablet (1 mg total) by mouth 3 (three) times daily.  [name] Kitchen amLODipine (NORVASC) 10 MG tablet TAKE 1 TABLET (5 MG TOTAL) BY MOUTH DAILY.  [name] Kitchen aspirin 325 MG tablet Take 325 mg by mouth daily.  [name] Kitchen atorvastatin (LIPITOR) 40 MG tablet TAKE 1 TABLET EVERY DAY AT 6PM  . benzonatate (TESSALON) 200 MG capsule Take 1 capsule (200 mg total) by mouth 3 (three) times daily as needed. (Patient not taking: Reported on 03/01/2020)  . clopidogrel (PLAVIX) 75 MG tablet Take 1 tablet (75 mg total) by mouth daily with breakfast.  . ezetimibe (ZETIA) 10 MG tablet Take 1 tablet (10 mg total) by mouth daily. For cholesterol  . fluticasone furoate-vilanterol (BREO ELLIPTA) 100-25 MCG/INH AEPB Inhale 1 puff into the lungs daily. (Patient  taking differently: Inhale 1 puff into the lungs daily as needed (for shortness of breath). )  . furosemide (LASIX) 20 MG tablet Take 1 tablet (20 mg total) by mouth daily.  [name] Kitchen glipiZIDE (GLUCOTROL) 5 MG tablet TAKE 1 TABLET (5 MG TOTAL) BY MOUTH DAILY BEFORE BREAKFAST.  [name] Kitchen loratadine (CLARITIN) 10 MG tablet Take 1 tablet (10 mg total) by mouth daily.  . ondansetron (ZOFRAN-ODT) 8 MG disintegrating tablet Take 1 tablet (8 mg total) by mouth every 6 (six) hours as needed for nausea or vomiting. (Patient not taking: Reported on 03/01/2020)  . sitaGLIPtin (JANUVIA) 100 MG tablet Take 1 tablet (100 mg total) by mouth daily.  . solifenacin (VESICARE) 10 MG tablet Take 1 tablet (10 mg total) by mouth daily.   No facility-administered encounter medications on file as of 03/09/2020.    BP Readings from Last 3 Encounters:  03/01/20 139/83  09/09/19 137/85  06/12/19 (!) 168/78   Lab Results  Component Value Date   HGBA1C 9.2 (H) 03/01/2020   HGBA1C 7.9 (H) 09/09/2019   HGBA1C 8.6 (H) 06/11/2019   Lab Results  Component Value Date   LDLCALC 97 03/01/2020   CREATININE 0.99 03/01/2020    RN Care Plan   . Diabetes Management (pt-stated)       CARE PLAN ENTRY (see longitudinal plan of care for additional care plan information)  Current Barriers:  . Chronic Disease Management support and education needs related to diabetes  Nurse Case Manager Clinical Goal(s):  [name] Kitchen Over the next 7 days, patient  will work with [name] to address needs related to diabetes management  Interventions:  . Inter-disciplinary care team collaboration (see longitudinal plan of care) . Evaluation of current treatment plan related to diabetes and patient's adherence to plan as established by provider. . Reviewed medications with patient and discussed glipizide and Januvia . Discussed plans with patient for ongoing care management follow up and provided patient with direct contact information for care management  team . Reviewed scheduled/upcoming provider appointments including: [name], [name] 03/10/2020 . Advised patient, providing education and rationale, to check cbg daily and record, calling [phone] for findings outside established parameters.   . Chart reviewed including recent office notes and lab results  Patient Self Care Activities:  . Performs ADL's independently . Performs IADL's independently  Initial goal documentation      . Hypertension Management (pt-stated)       CARE PLAN ENTRY (see longitudinal plan of care for additional care plan information)  Current Barriers:  . Chronic Disease Management support and education needs related to hypertension  Nurse Case Manager Clinical Goal(s):  [name] Kitchen Over the next 30 days, patient will demonstrate improved adherence to prescribed treatment plan for hypertension as evidenced bychecking and recording blood pressure daily  Interventions:  . Inter-disciplinary care team collaboration (see longitudinal plan of care) . Evaluation of current treatment plan related to hypertension and patient's adherence to plan as established by provider. . Reviewed medications with patient and discussed amlodipine . Discussed plans with patient for ongoing care management follow up and provided patient with direct contact information for care management team . Advised patient, providing education and rationale, to monitor blood pressure daily and record, calling [phone] for findings outside established parameters.   Patient Self Care Activities:  . Performs ADL's independently . Performs IADL's independently\  Initial goal documentation     . BLE Edema       CARE PLAN ENTRY (see longitudinal plan of care for additional care plan information)  Current Barriers:  [name] Kitchen Knowledge Deficits related to BLE Edema  Nurse Case Manager Clinical Goal(s):  [name] Kitchen Over the next 10 days, patient will meet with RN Care Manager to address BLE Edema . Over the  next 10 days, patient will contact PCP with any new or worsening symptoms . Over the next 10 days patient will elevate feel/legs when sitting and will decrease sodium intake  Interventions:  . Inter-disciplinary care team collaboration (see longitudinal plan of care) . Talked with patient by telephone o Discussed BLE edema on the past couple of days. Has had swelling in the past but not this bad. Swelling is equal in both legs/feet and is somewhat painful. o No chest pain or SOB  . Chart reviewed o Echo in 2020 was essentially normal o Unsure of where heart failure diagnosis on problem list came from . Collaborated with [name] whom she has an appointment with tomorrow . Encouraged patient to elevate feet/legs when sitting . Decrease salt/sodium in diet . Reviewed and discussed medications: amlodipine and furosemide . Encouraged patient to reach out to PCP with new or worsening symptoms  Patient Self Care Activities:  . Performs ADL's independently . Performs IADL's independently  Initial goal documentation         Plan:   The care management team will reach out to the patient again over the next 30 days.    SIGNATURE

## 2020-03-09 NOTE — Patient Instructions (Signed)
Visit Information  Goals Addressed            This Visit's Progress     Patient Stated   . Diabetes Management (pt-stated)       CARE PLAN ENTRY (see longitudinal plan of care for additional care plan information)  Current Barriers:  . Chronic Disease Management support and education needs related to diabetes  Nurse Case Manager Clinical Goal(s):  [name] Kitchen Over the next 7 days, patient will work with [name] to address needs related to diabetes management  Interventions:  . Inter-disciplinary care team collaboration (see longitudinal plan of care) . Evaluation of current treatment plan related to diabetes and patient's adherence to plan as established by provider. . Reviewed medications with patient and discussed glipizide and Januvia . Discussed plans with patient for ongoing care management follow up and provided patient with direct contact information for care management team . Reviewed scheduled/upcoming provider appointments including: [name], [name] 03/10/2020 . Advised patient, providing education and rationale, to check cbg daily and record, calling [phone] for findings outside established parameters.   . Chart reviewed including recent office notes and lab results  Patient Self Care Activities:  . Performs ADL's independently . Performs IADL's independently  Initial goal documentation      . Hypertension Management (pt-stated)       CARE PLAN ENTRY (see longitudinal plan of care for additional care plan information)  Current Barriers:  . Chronic Disease Management support and education needs related to hypertension  Nurse Case Manager Clinical Goal(s):  [name] Kitchen Over the next 30 days, patient will demonstrate improved adherence to prescribed treatment plan for hypertension as evidenced bychecking and recording blood pressure daily  Interventions:  . Inter-disciplinary care team collaboration (see longitudinal plan of care) . Evaluation of current treatment plan  related to hypertension and patient's adherence to plan as established by provider. . Reviewed medications with patient and discussed amlodipine . Discussed plans with patient for ongoing care management follow up and provided patient with direct contact information for care management team . Advised patient, providing education and rationale, to monitor blood pressure daily and record, calling [phone] for findings outside established parameters.   Patient Self Care Activities:  . Performs ADL's independently . Performs IADL's independently\  Initial goal documentation       Other   . BLE Edema       CARE PLAN ENTRY (see longitudinal plan of care for additional care plan information)  Current Barriers:  [name] Kitchen Knowledge Deficits related to BLE Edema  Nurse Case Manager Clinical Goal(s):  [name] Kitchen Over the next 10 days, patient will meet with RN Care Manager to address BLE Edema . Over the next 10 days, patient will contact PCP with any new or worsening symptoms . Over the next 10 days patient will elevate feel/legs when sitting and will decrease sodium intake  Interventions:  . Inter-disciplinary care team collaboration (see longitudinal plan of care) . Talked with patient by telephone o Discussed BLE edema on the past couple of days. Has had swelling in the past but not this bad. Swelling is equal in both legs/feet and is somewhat painful.  . Chart reviewed o Echo in 2020 was essentially normal o Unsure of where heart failure diagnosis on problem list came from . Collaborated with [name] whom she has an appointment with tomorrow . Encouraged patient to elevate feet/legs when sitting . Decrease salt/sodium in diet . Reviewed and discussed medications: amlodipine and furosemide . Encouraged patient to reach out to  PCP with new or worsening symptoms  Patient Self Care Activities:  . Performs ADL's independently . Performs IADL's independently  Initial goal documentation         The patient verbalized understanding of instructions provided today and declined a print copy of patient instruction materials.   Follow-up Plan The care management team will reach out to the patient again over the next 30 days.   Yvonne [name], BSN, RN-BC Embedded Chronic Care Manager [facility] [place] Management Direct Dial: [phone]

## 2020-03-10 ENCOUNTER — Ambulatory Visit (INDEPENDENT_AMBULATORY_CARE_PROVIDER_SITE_OTHER): Payer: Medicare HMO | Admitting: Pharmacist

## 2020-03-10 ENCOUNTER — Other Ambulatory Visit: Payer: Self-pay

## 2020-03-10 DIAGNOSIS — E119 Type 2 diabetes mellitus without complications: Secondary | ICD-10-CM

## 2020-03-11 NOTE — Progress Notes (Signed)
      S:    60yoF with PMH significant for T2DM, HLD, HTN , pt-reported h/o stroke.  Patient presents to [name] clinic for diabetes evaluation, education, and management  Patient was referred and last seen by Primary Care Provider on 03/01/20.   Insurance coverage/medication affordability: [employer] Medicare (will complete patient assistance)   Patient reports adherence with medications.  Current diabetes medications include: glipizide only (was not taking [place])  Current hypertension medications include: amlodipine 20mg  (goal 130/80)  Current hyperlipidemia medications include: atorvastatin 40mg , zetia 10mg  (LDL 97 on 03/01/20)  Patient denies hypoglycemic events.  Patient reported dietary habits: Eats 3 meals/day  Snacks: vienna cookies--discussed replacing with nuts, snacks with protein, "plate method" handout with snacks provided  Drinks: water, coffee, work to avoid sugary drinks,  Increase water intake   Patient-reported exercise habits: gardening    O:  Lab Results  Component Value Date   HGBA1C 9.2 (H) 03/01/2020   Lipid Panel     Component Value Date/Time   CHOL 166 03/01/2020 0846   TRIG 114 03/01/2020 0846   HDL 48 03/01/2020 0846   CHOLHDL 3.5 03/01/2020 0846   CHOLHDL 5.7 06/11/2019 0622   VLDL 39 06/11/2019 0622   LDLCALC 97 03/01/2020 0846   Home fasting blood sugars: 180-200   A/P:  Diabetes longstanding currently uncontrolled. Patient is able to verbalize appropriate hypoglycemia management plan. Patient is adherent with medication. Control is suboptimal due to current dietary/lifestyle .  -Started GLP-1 Ozempic (generic name: semaglutide) 0.25mg  into the skin weekly for 4 weeks then increase to 0.5mg  weekly.  -Sample given [id number], EXP 1/23   -Continued GLIPIZIDE for now  -Extensively discussed pathophysiology of diabetes, recommended lifestyle interventions, dietary effects on blood sugar control  -Counseled on s/sx of and management of  hypoglycemia  -Next A1C anticipated 52-months.     Written patient instructions provided.  Total time in face to face counseling 30 minutes.   Follow up Pharmacist Clinic Visit in 1 week.       Regina Eck, [name], BCPS Clinical Pharmacist, [place]  II Phone [phone]

## 2020-03-16 ENCOUNTER — Other Ambulatory Visit: Payer: Self-pay

## 2020-03-16 ENCOUNTER — Encounter: Payer: Self-pay | Admitting: Pharmacist

## 2020-03-16 ENCOUNTER — Ambulatory Visit (INDEPENDENT_AMBULATORY_CARE_PROVIDER_SITE_OTHER): Payer: Medicare HMO | Admitting: Pharmacist

## 2020-03-16 ENCOUNTER — Other Ambulatory Visit: Payer: Self-pay | Admitting: Family Medicine

## 2020-03-16 VITALS — BP 134/78 | HR 65

## 2020-03-16 DIAGNOSIS — E119 Type 2 diabetes mellitus without complications: Secondary | ICD-10-CM

## 2020-03-16 NOTE — Progress Notes (Signed)
    [place] DIABETES/HTN  03/16/2020 Name: Yvonne [name] MRN: [id number] DOB: October 23, 1951   S:    19yoF with PMH significant for T2DM, HLD, HTN , pt-reported h/o stroke.  Patient presents to [name] clinic for diabetes evaluation, education, and management.   Patient was referred and last seen by Primary Care Provider on 03/01/20.   Insurance coverage/medication affordability: [employer] Medicare (will complete patient assistance)   Patient reports adherence with medications.  Current diabetes medications include: glipizide. Ozempic 0.25mg   Current hypertension medications include: amlodipine 10mg  (goal 130/80)  Current hyperlipidemia medications include: atorvastatin 40mg , zetia 10mg  (LDL 97 on 03/01/20)  Patient denies hypoglycemic events.  Patient reported dietary habits: Eats 3 meals/day  Snacks: vienna cookies--discussed replacing with nuts, snacks with protein, "plate method" handout with snacks provided  Drinks: water, coffee, work to avoid sugary drinks,  Increase water intake   Patient-reported exercise habits: gardening    O:  Recent Labs       Lab Results  Component Value Date   HGBA1C 9.2 (H) 03/01/2020     Lipid Panel  Labs (Brief)          Component Value Date/Time   CHOL 166 03/01/2020 0846   TRIG 114 03/01/2020 0846   HDL 48 03/01/2020 0846   CHOLHDL 3.5 03/01/2020 0846   CHOLHDL 5.7 06/11/2019 0622   VLDL 39 06/11/2019 0622   LDLCALC 97 03/01/2020 0846     Home fasting blood sugars: 109-120 BP today 134/78 HR 65  A/P:  Diabetes longstanding currently uncontrolled. Patient is able to verbalize appropriate hypoglycemia management plan. Patient is adherent with medication. Control is suboptimal due to current dietary/lifestyle .  -Continue GLP-1 Ozempic (generic name: semaglutide) 0.25mg  into the skin weekly for 4 weeks then increase to 0.5mg  weekly.             -Sample given on 4/15 [id number], EXP 1/23  -Week 2 of  GLP1   -Continue GLIPIZIDE for now (plan to d/c in the near future once Ozempic titration complete)  -Lasix was increased at last visit to 20mg  daily.  Patient's lower leg edema has improved back to baseline.  Will continue amlodipine for now and continue to address.  Patient denies SOB  -Extensively discussed pathophysiology of diabetes, recommended lifestyle interventions, dietary effects on blood sugar control  -Counseled on s/sx of and management of hypoglycemia  -Next A1C anticipated 20-months.     Written patient instructions provided.  Total time in face to face counseling 14 minutes.   Follow up Pharmacist Clinic Visit in 1 week.       Regina Eck, [name], BCPS Clinical Pharmacist, [place]  II Phone [phone]

## 2020-03-17 ENCOUNTER — Ambulatory Visit: Payer: Self-pay | Admitting: Pharmacist

## 2020-03-18 ENCOUNTER — Telehealth: Payer: Medicare HMO

## 2020-03-23 ENCOUNTER — Ambulatory Visit (INDEPENDENT_AMBULATORY_CARE_PROVIDER_SITE_OTHER): Payer: Medicare HMO | Admitting: Pharmacist

## 2020-03-23 ENCOUNTER — Other Ambulatory Visit: Payer: Self-pay

## 2020-03-23 VITALS — BP 134/78 | HR 65 | Wt 179.6 lb

## 2020-03-23 DIAGNOSIS — E119 Type 2 diabetes mellitus without complications: Secondary | ICD-10-CM | POA: Diagnosis not present

## 2020-03-23 NOTE — Progress Notes (Signed)
   [place] DIABETES/HTN  03/23/2020 Name: Yvonne [name]            MRN: [id number]       DOB: [birth year]   S:69yoF with PMH significant for T2DM, HLD, HTN , pt-reported h/o stroke.Patientpresentsto [name] clinicfor diabetes evaluation, education, and management.Patient was referred and last seen by Primary Care Provider on 03/01/20.  Insurance coverage/medication affordability:[employer] Medicare (will complete patient assistance)  Patientreportsadherence with medications.  Current diabetes medications include:glipizide. Ozempic 0.25mg   Current hypertension medications include:amlodipine 10mg (goal 130/80)  Current hyperlipidemia medications include:atorvastatin 40mg , zetia 10mg (LDL 97 on 03/01/20)  Patientdenieshypoglycemic events.  Patient reported dietary habits: Eats37meals/day  Snacks:vienna cookies--discussed replacing with nuts, snacks with protein,"plate method" handout with snacks provided  Drinks:water, coffee, work to avoid sugary drinks, Increase water intake  Patient-reported exercise habits:gardening   O:  Recent Labs       Lab Results  Component Value Date   HGBA1C 9.2 (H) 03/01/2020     Lipid Panel  Labs (Brief)         Component Value Date/Time   CHOL 166 03/01/2020 0846   TRIG 114 03/01/2020 0846   HDL 48 03/01/2020 0846   CHOLHDL 3.5 03/01/2020 0846   CHOLHDL 5.7 06/11/2019 0622   VLDL 39 06/11/2019 0622   LDLCALC 97 03/01/2020 0846     Home fasting blood sugars:109-120 BP today 125/78 HR 69 Wt: 179.6 (pt has lost 8 lbs)  A/P:  Diabetes longstanding currentlyuncontrolled. Patient is able to verbalize appropriate hypoglycemia management plan. Patientisadherent with medication. Control is suboptimal due to current dietary/lifestyle.  -ContinueGLP-1 Ozempic(generic name: semaglutide)0.25mg  into the skin weekly for 4 weeks then increase to 0.5mg   weekly. -Sample used on 4/15 [id number], EXP 1/23             -Week 3 of GLP1 (will increase dose in 2 weeks)  -Patient still unable to inject Ozempic on her own--working on injection technique and confidence   -ContinueGLIPIZIDE for now (plan to d/c in the near future once Ozempic titration complete)  -Lasix was increased at last visit to 20mg  daily.  Patient's lower leg edema has improved back to baseline.  Will continue amlodipine for now and continue to address.  Patient denies SOB  -Extensively discussed pathophysiology of diabetes, recommended lifestyle interventions, dietary effects on blood sugar control  -Counseled on s/sx of and management of hypoglycemia  -Next A1C anticipated42-months.    Written patient instructions provided. Total time in face to face counseling 36minutes.   Follow up Pharmacist Clinic Visit in1 week.   Regina Eck, [name], BCPS Clinical Pharmacist, [place] [phone]

## 2020-03-24 ENCOUNTER — Telehealth: Payer: Self-pay

## 2020-03-30 ENCOUNTER — Ambulatory Visit: Payer: Medicare HMO | Admitting: Pharmacist

## 2020-03-30 ENCOUNTER — Other Ambulatory Visit: Payer: Self-pay

## 2020-04-04 ENCOUNTER — Other Ambulatory Visit: Payer: Self-pay | Admitting: Family Medicine

## 2020-04-04 MED ORDER — ACCU-CHEK GUIDE W/DEVICE KIT: PACK | 0 refills | Status: DC

## 2020-04-04 MED ORDER — ACCU-CHEK GUIDE VI STRP: ORAL_STRIP | 3 refills | Status: DC

## 2020-04-04 MED ORDER — ACCU-CHEK SOFTCLIX LANCETS MISC: 3 refills | Status: DC

## 2020-04-04 NOTE — Telephone Encounter (Signed)
  Prescription Request  04/04/2020  What is the name of the medication or equipment? Needs ACCU CHEK monitor called in. Her's broke Saturday  Have you contacted your pharmacy to request a refill? (if applicable) NO  Which pharmacy would you like this sent to? [facility] in [place]   Patient notified that their request is being sent to the clinical staff for review and that they should receive a response within 2 business days.

## 2020-04-04 NOTE — Telephone Encounter (Signed)
Pt aware new meter, test strips & lancets sent to pharmacy

## 2020-04-05 ENCOUNTER — Ambulatory Visit (INDEPENDENT_AMBULATORY_CARE_PROVIDER_SITE_OTHER): Payer: Medicare HMO | Admitting: Licensed Clinical Social Worker

## 2020-04-05 DIAGNOSIS — E785 Hyperlipidemia, unspecified: Secondary | ICD-10-CM | POA: Diagnosis not present

## 2020-04-05 DIAGNOSIS — I10 Essential (primary) hypertension: Secondary | ICD-10-CM

## 2020-04-05 DIAGNOSIS — I5032 Chronic diastolic (congestive) heart failure: Secondary | ICD-10-CM | POA: Diagnosis not present

## 2020-04-05 DIAGNOSIS — F411 Generalized anxiety disorder: Secondary | ICD-10-CM

## 2020-04-05 DIAGNOSIS — E119 Type 2 diabetes mellitus without complications: Secondary | ICD-10-CM | POA: Diagnosis not present

## 2020-04-05 NOTE — Patient Instructions (Addendum)
Licensed Clinical Social Worker Visit Information  Goals we discussed today:  Goals    . Client stated: "I want to get help in managing stress and anxiety" (pt-stated)     Current Barriers:  [name] Kitchen Mental Health Concerns  in patient with Chronic Diagnoses of GAD, HTN, Type 2 DM,HLD, CHF . Financial challenges . Spouse of client is ill  Clinical Social Work Clinical Goal(s):  [name] Kitchen Over the next 30 days, M[place] will talk with [name] regarding management of anxiety and stress symptoms of client    Interventions: Encouraged client to use relaxation techniques of choice to manage anxiety and stress symptoms (enjoys listening to music, watching TV, talking with sister via phone)  Encouraged client to talk regularly with [name] to discuss anxiety and stress symptoms experienced by client Encouraged client to talk with [name] as needed to discuss nursing needs of client  Encouraged client to talk with her sister as a means of emotional support  Talked with client about sleeping challenges of client  Talked with client about exercise activity (she enjoys walking outdoors with pet dog)  Talked with client about her energy level  Talked with client about health issue of her grandson  Talked with client about stress issues related to her son (son is in jail currently)  Provided counseling support for client  Talked with Ski about edema (swelling in her legs)  Patient Self Care Activities:  . Attends all scheduled provider appointments  . Client speaks daily with her sister for emotional support  .  Client enjoys reading as a way to manage stress   Plan: Client to call [name] as needed to discuss anxiety and stress issues of client [name] to call client in next 4 weeks to discuss relaxation techniques use of client (reading, gardening in flowers) Client to communicate with [name] to discuss client's nursing needs. Client to participate in self care activities of choice Client to communicate with her sister  as a means of emotional support. Client to attend scheduled medical appointments  Initial goal documentation       Materials Provided: No  Follow Up Plan: [name] to call client in next 4 weeks to discuss relaxation techniques use by  client   The patient verbalized understanding of instructions provided today and declined a print copy of patient instruction materials.   [name].Donneisha Beane MSW, [name] Licensed Clinical Social Worker [facility] Care Management [phone]

## 2020-04-05 NOTE — Chronic Care Management (AMB) (Signed)
Chronic Care Management    Clinical Social Work Follow Up Note  04/05/2020 Name: Yvonne [name] MRN: [id number] DOB: 07/08/1951  Yvonne [name] is a 69 y.o. year old female who is a primary care patient of [name], Warren, MD. The CCM team was consulted for assistance with [employer] .   Review of patient status, including review of consultants reports, other relevant assessments, and collaboration with appropriate care team members and the patient's provider was performed as part of comprehensive patient evaluation and provision of chronic care management services.    SDOH (Social Determinants of Health) assessments performed: Yes;risk for tobacco use; risk for depression    Office Visit from 03/01/2020 in [facility]  PHQ-9 Total Score  17     GAD 7 : Generalized Anxiety Score 12/02/2019 09/09/2019 12/05/2018 01/22/2018  Nervous, Anxious, on Edge 2 0 3 2  Control/stop worrying 3 1 3 3  Worry too much - different things 3 1 3 1  Trouble relaxing 3 1 3 1  Restless 3 1 2 1  Easily annoyed or irritable 1 0 2 1  Afraid - awful might happen 0 0 3 1  Total GAD 7 Score 15 4 19 10  Anxiety Difficulty Somewhat difficult - Extremely difficult -    Outpatient Encounter Medications as of 04/05/2020  Medication Sig Note  . Accu-Chek Softclix Lancets lancets Test BS up to 4 times daily Dx E11.69   . albuterol (VENTOLIN HFA) 108 (90 Base) MCG/ACT inhaler Inhale 2 puffs into the lungs every 6 (six) hours as needed for wheezing or shortness of breath.   . ALPRAZolam (XANAX) 1 MG tablet Take 1 tablet (1 mg total) by mouth 3 (three) times daily.   . amLODipine (NORVASC) 10 MG tablet TAKE 1 TABLET (5 MG TOTAL) BY MOUTH DAILY. (Patient taking differently: 10 mg. TAKE 1 TABLET (5 MG TOTAL) BY MOUTH DAILY.)   . aspirin 325 MG tablet Take 325 mg by mouth daily.   . atorvastatin (LIPITOR) 40 MG tablet TAKE 1 TABLET EVERY DAY AT 6PM   . Blood Glucose Monitoring Suppl (ACCU-CHEK GUIDE)  w/Device KIT Test Bs QID Dx E11.69   . clopidogrel (PLAVIX) 75 MG tablet Take 1 tablet (75 mg total) by mouth daily with breakfast.   . ezetimibe (ZETIA) 10 MG tablet Take 1 tablet (10 mg total) by mouth daily. For cholesterol   . fluticasone furoate-vilanterol (BREO ELLIPTA) 100-25 MCG/INH AEPB Inhale 1 puff into the lungs daily. (Patient taking differently: Inhale 1 puff into the lungs daily as needed (for shortness of breath). )   . furosemide (LASIX) 20 MG tablet Take 1 tablet (20 mg total) by mouth daily.   . glipiZIDE (GLUCOTROL) 5 MG tablet TAKE 1 TABLET (5 MG TOTAL) BY MOUTH DAILY BEFORE BREAKFAST.   . glucose blood (ACCU-CHEK GUIDE) test strip Test BS up to 4 times daily Dx E11.69   . loratadine (CLARITIN) 10 MG tablet Take 1 tablet (10 mg total) by mouth daily.   . Semaglutide,0.25 or 0.5MG/DOS, (OZEMPIC, 0.25 OR 0.5 MG/DOSE,) 2 MG/1.5ML SOPN Inject into the skin. Inject 0.25mg into the skin weekly for 4 weeks, then increase to 0.5mg weekly. 03/16/2020: EVERY Wednesday  . solifenacin (VESICARE) 10 MG tablet Take 1 tablet (10 mg total) by mouth daily.    No facility-administered encounter medications on file as of 04/05/2020.    Goals        . Client stated: "I want to get help in managing   stress and anxiety" (pt-stated)     Current Barriers:  . Mental Health Concerns  in patient with Chronic Diagnoses of GAD, HTN, Type 2 DM,HLD, CHF . Financial challenges . Spouse of client is ill  Clinical Social Work Clinical Goal(s):  . Over the next 30 days, [name] will talk with [name] regarding management of anxiety and stress symptoms of client    Interventions:  Encouraged client touse relaxation techniques of choice to manage anxiety and stress symptoms(enjoys listening to music, watching TV, talking with sister via phone)   Encouraged client to talk regularly with [name] to discuss anxiety and stress symptoms experienced by client  Encouraged client to talk with [name] as needed to  discuss nursing needs of client  Encouraged clientto talk with her sister as a means of emotional support   Talked with clientabout sleeping challenges of client  Talked with clientabout exercise activity (she enjoys walking outdoors with pet dog)  Talked with client about her energy level  Talked with client about health issue of her grandson  Talked with client about stress issues related to her son (son is in jail currently)  Provided counseling support for client  Talked with Rheta about edema (swelling in her legs)  Patient Self Care Activities:  . Attends all scheduled provider appointments  . Client speaks daily with her sister for emotional support  .  Client enjoys reading as a way to manage stress   Plan: Client to call [name] as needed to discuss anxiety and stress issues of client [name] to call client in next 4 weeks to discuss relaxation techniques use of client (reading, gardening in flowers) Client to communicate with [name] to discuss client's nursing needs. Client to participate in self care activities of choice Client to communicate with her sister as a means of emotional support. Client to attend scheduled medical appointments  Initial goal documentation      Follow Up Plan: [name] to call client in next 4 weeks to talk with client about client use of relaxation techniques   S. MSW, [name] Licensed Clinical Social Worker [facility] [phone] 

## 2020-04-06 ENCOUNTER — Telehealth: Payer: Self-pay | Admitting: Family Medicine

## 2020-04-06 NOTE — Telephone Encounter (Signed)
Patient has been approved for [employer] through [employer] Patient assistance program (until 10/25/20).  Medication will ship to PCP office in 10-14 business days (60-month supply).   Patient notified and verbalizes understanding FBG at goal! Denies FBG>180, FBG< 70, ranges 110-150  Ozempic 0.5mg  sq weekly #1pen sample [id number], EXP 5/23 left in fridge for patient to pick up   Regina Eck, [name], BCPS Clinical Pharmacist, [place]  II Phone [phone]

## 2020-04-15 ENCOUNTER — Telehealth: Payer: Self-pay | Admitting: Pharmacist

## 2020-04-15 ENCOUNTER — Other Ambulatory Visit (HOSPITAL_COMMUNITY): Payer: Self-pay | Admitting: Family Medicine

## 2020-04-15 DIAGNOSIS — Z1231 Encounter for screening mammogram for malignant neoplasm of breast: Secondary | ICD-10-CM

## 2020-04-15 NOTE — Telephone Encounter (Signed)
Called patient to let her know shipment of Ozempic has arrived courtesey of [employer] patient assistance 27-month supply [id number] EXP 07/26/22  Encouraged patient to pick up medication as soon as possible

## 2020-04-28 ENCOUNTER — Ambulatory Visit: Payer: Medicare HMO | Admitting: *Deleted

## 2020-04-28 DIAGNOSIS — I10 Essential (primary) hypertension: Secondary | ICD-10-CM

## 2020-04-28 DIAGNOSIS — E119 Type 2 diabetes mellitus without complications: Secondary | ICD-10-CM

## 2020-04-28 NOTE — Chronic Care Management (AMB) (Signed)
  Chronic Care Management   Follow-up Note  04/28/2020 Name: Yvonne [name] MRN: [id number] DOB: 08-10-1951  Referred by: [name], MD Reason for referral : Chronic Care Management (RN follow up)   An unsuccessful telephone follow-up was attempted today. The patient was referred to the case management team for assistance with care management and care coordination.   Follow Up Plan: A HIPPA compliant phone message was left for the patient providing contact information and requesting a return call.  The care management team will reach out to the patient again over the next 45 days.   [name], BSN, RN-BC Embedded Chronic Care Manager [facility] / [place] [facility] Direct Dial: [phone]

## 2020-05-02 ENCOUNTER — Encounter: Payer: Self-pay | Admitting: Family Medicine

## 2020-05-02 ENCOUNTER — Ambulatory Visit (INDEPENDENT_AMBULATORY_CARE_PROVIDER_SITE_OTHER): Payer: Medicare HMO | Admitting: Family Medicine

## 2020-05-02 ENCOUNTER — Other Ambulatory Visit: Payer: Self-pay

## 2020-05-02 VITALS — BP 131/76 | HR 72 | Temp 98.0°F | Resp 20 | Ht 60.0 in | Wt 181.2 lb

## 2020-05-02 DIAGNOSIS — F1320 Sedative, hypnotic or anxiolytic dependence, uncomplicated: Secondary | ICD-10-CM

## 2020-05-02 DIAGNOSIS — E1122 Type 2 diabetes mellitus with diabetic chronic kidney disease: Secondary | ICD-10-CM

## 2020-05-02 DIAGNOSIS — E785 Hyperlipidemia, unspecified: Secondary | ICD-10-CM | POA: Diagnosis not present

## 2020-05-02 DIAGNOSIS — J439 Emphysema, unspecified: Secondary | ICD-10-CM | POA: Diagnosis not present

## 2020-05-02 DIAGNOSIS — I10 Essential (primary) hypertension: Secondary | ICD-10-CM

## 2020-05-02 DIAGNOSIS — F411 Generalized anxiety disorder: Secondary | ICD-10-CM

## 2020-05-02 DIAGNOSIS — N1831 Chronic kidney disease, stage 3a: Secondary | ICD-10-CM | POA: Diagnosis not present

## 2020-05-02 DIAGNOSIS — E1121 Type 2 diabetes mellitus with diabetic nephropathy: Secondary | ICD-10-CM

## 2020-05-02 MED ORDER — AMLODIPINE BESYLATE 10 MG PO TABS: ORAL_TABLET | ORAL | 1 refills | Status: DC

## 2020-05-02 MED ORDER — ALPRAZOLAM 1 MG PO TABS: 1.0000 mg | ORAL_TABLET | Freq: Three times a day (TID) | ORAL | 5 refills | Status: DC

## 2020-05-02 MED ORDER — EZETIMIBE 10 MG PO TABS: 10.0000 mg | ORAL_TABLET | Freq: Every day | ORAL | 1 refills | Status: DC

## 2020-05-02 MED ORDER — SOLIFENACIN SUCCINATE 10 MG PO TABS: 10.0000 mg | ORAL_TABLET | Freq: Every day | ORAL | 1 refills | Status: DC

## 2020-05-02 MED ORDER — GLIPIZIDE 5 MG PO TABS: 5.0000 mg | ORAL_TABLET | Freq: Every day | ORAL | 1 refills | Status: DC

## 2020-05-02 MED ORDER — ALBUTEROL SULFATE HFA 108 (90 BASE) MCG/ACT IN AERS: 2.0000 | INHALATION_SPRAY | Freq: Four times a day (QID) | RESPIRATORY_TRACT | 1 refills | Status: DC | PRN

## 2020-05-02 MED ORDER — BREO ELLIPTA 100-25 MCG/INH IN AEPB: 1.0000 | INHALATION_SPRAY | Freq: Every day | RESPIRATORY_TRACT | 1 refills | Status: DC

## 2020-05-02 MED ORDER — ATORVASTATIN CALCIUM 40 MG PO TABS: ORAL_TABLET | ORAL | 1 refills | Status: DC

## 2020-05-02 MED ORDER — CLOPIDOGREL BISULFATE 75 MG PO TABS: 75.0000 mg | ORAL_TABLET | Freq: Every day | ORAL | 1 refills | Status: DC

## 2020-05-02 MED ORDER — FUROSEMIDE 20 MG PO TABS: 20.0000 mg | ORAL_TABLET | Freq: Every day | ORAL | 1 refills | Status: DC

## 2020-05-02 NOTE — Progress Notes (Signed)
Subjective:  Patient ID: Yvonne [name], female    DOB: 1951-07-24  Age: 69 y.o. MRN: [id number]  CC: Medical Management of Chronic Issues   HPI IVIONNA VERLEY presents for patient in for follow-up of elevated cholesterol. Doing well without complaints on current medication. Denies side effects of statin including myalgia and arthralgia and nausea.. Currently no chest pain, shortness of breath or other cardiovascular related symptoms noted.  Patient also states that she cannot function without her Xanax.  She is taking 1 mg 3 times daily which is the equivalent of lorazepam 6 mg daily.  She gets agitated at the limitation she faces due to her overall health.  She is unable to be as active as she had like particularly with her grandkids.  She cannot do the yard work she is used to doing. Hand Has Tried Cymbalta and That Made Her More Jittery and Nervous.  Zoloft Made Her Feel Suicidal in the past.  Circumstances at Home Are That She Says Her Daughter-In-Law Is Lazy and Leaves Her 3 Kids for Ms. [name] and Ms. Quinn's Son, the Husband of the Daughter-In-Law to Take Care Of.  They Are Living with Her Currently and Although She Lives the Grandkids It Is Stressful to Have To Be Their Main Caregiver.  Patient COPD is stable as long she takes her Breo and Ventolin.  She is out of both and began to feel some dyspnea on exertion.  GAD 7 : Generalized Anxiety Score 05/02/2020 12/02/2019 09/09/2019 12/05/2018  Nervous, Anxious, on Edge 1 2 0 3  Control/stop worrying _0 Worry too much - different things _1 Trouble relaxing _2 Restless _3 Easily annoyed or irritable 1 1 0 2  Afraid - awful might happen 0 0 0 3  Total GAD 7 Score _4 Anxiety Difficulty Somewhat difficult Somewhat difficult - Extremely difficult     Depression screen [facility] 2/9 05/02/2020 05/02/2020 03/01/2020  Decreased Interest _5 Down, Depressed, Hopeless 0 0 1  PHQ - 2 Score _6 Altered sleeping 3 - 3   Tired, decreased energy 1 - 1  Change in appetite 1 - 3  Feeling bad or failure about yourself  1 - 3  Trouble concentrating 0 - 0  Moving slowly or fidgety/restless 1 - 3  Suicidal thoughts 0 - 0  PHQ-9 Score 8 - 17  Difficult doing work/chores Somewhat difficult - Somewhat difficult  Some recent data might be hidden    History Viviana has a past medical history of Anxiety, Arthritis, Asthma, Chronic cough, Chronic otitis media (12/2017), Full dentures, GERD (gastroesophageal reflux disease), History of stroke (09/2017), Hyperlipidemia, Hypertension, Non-insulin dependent type 2 diabetes mellitus ([place]), Overactive bladder, Recurrent acute suppurative otitis media without spontaneous rupture of left tympanic membrane (02/19/2018), and Transient cerebral ischemia.   She has a past surgical history that includes Cholecystectomy; Colonoscopy (N/A, 08/17/2015); Abdominal hysterectomy; Lacrimal duct exploration (Bilateral); Cataract extraction w/ intraocular lens implant (Right); Vitrectomy (Left, 09/14/2015); Gas insertion (Right); and Myringotomy with tube placement (Bilateral, 01/28/2018).   Her family history includes Arthritis in her brother and mother; Arthritis-Osteo in her son; Asthma in her father; CVA in her mother; Cancer in her brother; Diabetes in her father and mother; Heart disease in her father; Hepatitis C in her sister; Peripheral Artery Disease in her daughter; Stroke in her mother.She reports that she has never  smoked. She has never used smokeless tobacco. She reports that she does not drink alcohol or use drugs.    ROS Review of Systems  Constitutional: Negative.   HENT: Negative.   Eyes: Negative for visual disturbance.  Respiratory: Positive for shortness of breath.   Cardiovascular: Negative for chest pain.  Gastrointestinal: Negative for abdominal pain.  Musculoskeletal: Negative for arthralgias.  Psychiatric/Behavioral: Positive for agitation and dysphoric mood. The  patient is nervous/anxious.     Objective:  BP 131/76   Pulse 72   Temp 98 F (36.7 C) (Temporal)   Resp 20   Ht 5' (1.524 m)   Wt 181 lb 4 oz (82.2 kg)   SpO2 99%   BMI 35.40 kg/m   BP Readings from Last 3 Encounters:  05/02/20 131/76  03/23/20 134/78  03/16/20 134/78    Wt Readings from Last 3 Encounters:  05/02/20 181 lb 4 oz (82.2 kg)  03/23/20 179 lb 9.6 oz (81.5 kg)  03/01/20 187 lb (84.8 kg)     Physical Exam Constitutional:      General: She is not in acute distress.    Appearance: She is well-developed.  Cardiovascular:     Rate and Rhythm: Normal rate and regular rhythm.  Pulmonary:     Breath sounds: Normal breath sounds.  Skin:    General: Skin is warm and dry.  Neurological:     Mental Status: She is alert and oriented to person, place, and time.       Assessment & Plan:   Seth was seen today for medical management of chronic issues.  Diagnoses and all orders for this visit:  Type 2 diabetes mellitus with stage 3a chronic kidney disease, without long-term current use of insulin (HCC) -     atorvastatin (LIPITOR) 40 MG tablet; TAKE 1 TABLET EVERY DAY AT 6PM -     ezetimibe (ZETIA) 10 MG tablet; Take 1 tablet (10 mg total) by mouth daily. For cholesterol -     glipiZIDE (GLUCOTROL) 5 MG tablet; Take 1 tablet (5 mg total) by mouth daily before breakfast.  GAD (generalized anxiety disorder) -     ALPRAZolam (XANAX) 1 MG tablet; Take 1 tablet (1 mg total) by mouth 3 (three) times daily.  Hyperlipidemia with target LDL less than 70 -     atorvastatin (LIPITOR) 40 MG tablet; TAKE 1 TABLET EVERY DAY AT 6PM -     ezetimibe (ZETIA) 10 MG tablet; Take 1 tablet (10 mg total) by mouth daily. For cholesterol  Benzodiazepine dependence, continuous (HCC) -     ALPRAZolam (XANAX) 1 MG tablet; Take 1 tablet (1 mg total) by mouth 3 (three) times daily.  Pulmonary emphysema, unspecified emphysema type (HCC) -     albuterol (VENTOLIN HFA) 108 (90 Base)  MCG/ACT inhaler; Inhale 2 puffs into the lungs every 6 (six) hours as needed for wheezing or shortness of breath. -     fluticasone furoate-vilanterol (BREO ELLIPTA) 100-25 MCG/INH AEPB; Inhale 1 puff into the lungs daily.  Essential hypertension -     amLODipine (NORVASC) 10 MG tablet; TAKE 1 TABLET BY MOUTH DAILY.  Other orders -     clopidogrel (PLAVIX) 75 MG tablet; Take 1 tablet (75 mg total) by mouth daily with breakfast. -     furosemide (LASIX) 20 MG tablet; Take 1 tablet (20 mg total) by mouth daily. -     solifenacin (VESICARE) 10 MG tablet; Take 1 tablet (10 mg total) by mouth daily.  I have changed [name]. Malenfant's amLODipine. I am also having her maintain her loratadine, aspirin, Ozempic (0.25 or 0.5 MG/DOSE), Accu-Chek Guide, Accu-Chek Softclix Lancets, Accu-Chek Guide, albuterol, ALPRAZolam, atorvastatin, clopidogrel, ezetimibe, Breo Ellipta, furosemide, glipiZIDE, and solifenacin.  Allergies as of 05/02/2020      Reactions   Penicillins Hives, Itching   Has patient had a PCN reaction causing immediate rash, facial/tongue/throat swelling, SOB or lightheadedness with hypotension: Yes Has patient had a PCN reaction causing severe rash involving mucus membranes or skin necrosis: Unk Has patient had a PCN reaction that required hospitalization: No Has patient had a PCN reaction occurring within the last 10 years: No If all of the above answers are "NO", then may proceed with Cephalosporin use. Has patient had a PCN reaction causing immediate rash, facial/tongue/throat swelling, SOB or lightheadedness with hypotension: Yes Has patient had a PCN reaction causing severe rash involving mucus membranes or skin necrosis: No Has patient had a PCN reaction that required hospitalization No Has patient had a PCN reaction occurring within the last 10 years: No If all of the above answers are "NO", then may proceed with Cephalosporin use. Has patient had a PCN reaction causing  immediate rash, facial/tongue/throat swelling, SOB or lightheadedness with hypotension: Yes Has patient had a PCN reaction causing severe rash involving mucus membranes or skin necrosis: Unk Has patient had a PCN reaction that required hospitalization: No Has patient had a PCN reaction occurring within the last 10 years: No If all of the above answers are "NO", then may proceed with Cephalosporin use. Has patient had a PCN reaction causing immediate rash, facial/tongue/throat swelling, SOB or lightheadedness with hypotension: Yes Has patient had a PCN reaction causing severe rash involving mucus membranes or skin necrosis: No Has patient had a PCN reaction that required hospitalization No Has patient had a PCN reaction occurring within the last 10 years: No If all of the above answers are "NO", then may proceed with Cephalosporin use. Has patient had a PCN reaction causing immediate rash, facial/tongue/throat swelling, SOB or lightheadedness with hypotension: Yes Has patient had a PCN reaction causing sev... (TRUNCATED)   Gabapentin Other (See Comments)   Causes a lot of lethargy at a higher dose Causes a lot of lethargy at a higher dose Causes a lot of lethargy at a higher dose   Lisinopril Cough   HAS A CHRONIC COUGH AND DID NOT NOTICE IMPROVEMENT OF COUGH WHEN LISINOPRIL WAS STOPPED HAS A CHRONIC COUGH AND DID NOT NOTICE IMPROVEMENT OF COUGH WHEN LISINOPRIL WAS STOPPED HAS A CHRONIC COUGH AND DID NOT NOTICE IMPROVEMENT OF COUGH WHEN LISINOPRIL WAS STOPPED   Soap Rash   DIAL soap      Medication List       Accurate as of May 02, 2020 10:49 PM. If you have any questions, ask your nurse or doctor.        Accu-Chek Guide test strip Generic drug: glucose blood Test BS up to 4 times daily Dx E11.69   Accu-Chek Guide w/Device Kit Test Bs QID Dx E11.69   Accu-Chek Softclix Lancets lancets Test BS up to 4 times daily Dx E11.69   albuterol 108 (90 Base) MCG/ACT inhaler Commonly  known as: Ventolin HFA Inhale 2 puffs into the lungs every 6 (six) hours as needed for wheezing or shortness of breath.   ALPRAZolam 1 MG tablet Commonly known as: XANAX Take 1 tablet (1 mg total) by mouth 3 (three) times daily.   amLODipine 10 MG tablet Commonly  known as: NORVASC TAKE 1 TABLET BY MOUTH DAILY. What changed: additional instructions Changed by: [name], MD   aspirin 325 MG tablet Take 325 mg by mouth daily.   atorvastatin 40 MG tablet Commonly known as: LIPITOR TAKE 1 TABLET EVERY DAY AT 6PM   Breo Ellipta 100-25 MCG/INH Aepb Generic drug: fluticasone furoate-vilanterol Inhale 1 puff into the lungs daily. What changed:   when to take this  reasons to take this   clopidogrel 75 MG tablet Commonly known as: PLAVIX Take 1 tablet (75 mg total) by mouth daily with breakfast.   ezetimibe 10 MG tablet Commonly known as: Zetia Take 1 tablet (10 mg total) by mouth daily. For cholesterol   furosemide 20 MG tablet Commonly known as: LASIX Take 1 tablet (20 mg total) by mouth daily.   glipiZIDE 5 MG tablet Commonly known as: GLUCOTROL Take 1 tablet (5 mg total) by mouth daily before breakfast.   loratadine 10 MG tablet Commonly known as: CLARITIN Take 1 tablet (10 mg total) by mouth daily.   Ozempic (0.25 or 0.5 MG/DOSE) 2 MG/1.5ML Sopn Generic drug: Semaglutide(0.25 or 0.5MG/DOS) Inject 0.5 mg into the skin once a week.   solifenacin 10 MG tablet Commonly known as: VESIcare Take 1 tablet (10 mg total) by mouth daily.        Follow-up: Return in about 1 month (around 06/01/2020).  [name], M.D.

## 2020-05-04 ENCOUNTER — Ambulatory Visit (HOSPITAL_COMMUNITY): Payer: Medicare HMO

## 2020-05-11 ENCOUNTER — Ambulatory Visit: Payer: Self-pay | Admitting: Licensed Clinical Social Worker

## 2020-05-11 DIAGNOSIS — F411 Generalized anxiety disorder: Secondary | ICD-10-CM

## 2020-05-11 DIAGNOSIS — E119 Type 2 diabetes mellitus without complications: Secondary | ICD-10-CM

## 2020-05-11 DIAGNOSIS — I10 Essential (primary) hypertension: Secondary | ICD-10-CM

## 2020-05-11 DIAGNOSIS — I5032 Chronic diastolic (congestive) heart failure: Secondary | ICD-10-CM

## 2020-05-11 DIAGNOSIS — E785 Hyperlipidemia, unspecified: Secondary | ICD-10-CM

## 2020-05-11 NOTE — Patient Instructions (Addendum)
Licensed Clinical Social Worker Visit Information  Materials Provided: No  05/11/2020  Name: BRIE EPPARD            MRN: [id number]       DOB: [birth year]  LOANA SALVAGGIO is a 69 y.o. year old female who is a primary care patient of [name], [name], MD. The CCM team was consulted for assistance with [employer] .   Review of patient status, including review of consultants reports, other relevant assessments, and collaboration with appropriate care team members and the patient's provider was performed as part of comprehensive patient evaluation and provision of chronic care management services.    SDOH (Social Determinants of Health) assessments performed: No;risk for tobacco use; risk for depression;risk for stress  [name] spoke briefly today via phone with client. Client is attending scheduled medical appointments and is taking medications as prescribed. Client said she was at an appointment at local school and client asked [name] to call her back in next 3 weeks to discuss her current social work needs.   Follow Up Plan: [name] to call client in next 3 weeks to talk with her about relaxation techniques use of client and social work needs of client at that time  The patient verbalized understanding of instructions provided today and declined a print copy of patient instruction materials.   [name].Betty Brooks MSW, [name] Licensed Clinical Social Worker [facility] Care Management [phone]

## 2020-05-11 NOTE — Chronic Care Management (AMB) (Signed)
Chronic Care Management    Clinical Social Work Follow Up Note  05/11/2020 Name: Yvonne [name] MRN: [id number] DOB: 26-Feb-1951  Yvonne [name] is a 69 y.o. year old female who is a primary care patient of [name], [name], MD. The CCM team was consulted for assistance with [employer] .   Review of patient status, including review of consultants reports, other relevant assessments, and collaboration with appropriate care team members and the patient's provider was performed as part of comprehensive patient evaluation and provision of chronic care management services.    SDOH (Social Determinants of Health) assessments performed: No;risk for tobacco use; risk for depression;risk for stress    Office Visit from 05/02/2020 in [facility]  PHQ-9 Total Score 8       GAD 7 : Generalized Anxiety Score 05/02/2020 12/02/2019 09/09/2019 12/05/2018  Nervous, Anxious, on Edge 1 2 0 3  Control/stop worrying '1 3 1 3  '$ Worry too much - different things '1 3 1 3  '$ Trouble relaxing '3 3 1 3  '$ Restless '1 3 1 2  '$ Easily annoyed or irritable 1 1 0 2  Afraid - awful might happen 0 0 0 3  Total GAD 7 Score '8 15 4 19  '$ Anxiety Difficulty Somewhat difficult Somewhat difficult - Extremely difficult    Outpatient Encounter Medications as of 05/11/2020  Medication Sig Note  . Accu-Chek Softclix Lancets lancets Test BS up to 4 times daily Dx E11.69   . albuterol (VENTOLIN HFA) 108 (90 Base) MCG/ACT inhaler Inhale 2 puffs into the lungs every 6 (six) hours as needed for wheezing or shortness of breath.   . ALPRAZolam (XANAX) 1 MG tablet Take 1 tablet (1 mg total) by mouth 3 (three) times daily.   [name] Kitchen amLODipine (NORVASC) 10 MG tablet TAKE 1 TABLET BY MOUTH DAILY.   [name] Kitchen aspirin 325 MG tablet Take 325 mg by mouth daily.   [name] Kitchen atorvastatin (LIPITOR) 40 MG tablet TAKE 1 TABLET EVERY DAY AT 6PM   . Blood Glucose Monitoring Suppl (ACCU-CHEK GUIDE) w/Device KIT Test Bs QID Dx E11.69   . clopidogrel (PLAVIX)  75 MG tablet Take 1 tablet (75 mg total) by mouth daily with breakfast.   . ezetimibe (ZETIA) 10 MG tablet Take 1 tablet (10 mg total) by mouth daily. For cholesterol   . fluticasone furoate-vilanterol (BREO ELLIPTA) 100-25 MCG/INH AEPB Inhale 1 puff into the lungs daily.   . furosemide (LASIX) 20 MG tablet Take 1 tablet (20 mg total) by mouth daily.   [name] Kitchen glipiZIDE (GLUCOTROL) 5 MG tablet Take 1 tablet (5 mg total) by mouth daily before breakfast.   . glucose blood (ACCU-CHEK GUIDE) test strip Test BS up to 4 times daily Dx E11.69   . loratadine (CLARITIN) 10 MG tablet Take 1 tablet (10 mg total) by mouth daily.   . Semaglutide,0.25 or 0.'5MG'$ /DOS, (OZEMPIC, 0.25 OR 0.5 MG/DOSE,) 2 MG/1.5ML SOPN Inject 0.5 mg into the skin once a week.  03/16/2020: EVERY Wednesday  . solifenacin (VESICARE) 10 MG tablet Take 1 tablet (10 mg total) by mouth daily.    No facility-administered encounter medications on file as of 05/11/2020.    [name] spoke briefly today via phone with client. Client is attending scheduled medical appointments and is taking medications as prescribed. Client said she was at an appointment at local school and client asked [name] to call her back in next 3 weeks to discuss her current social work needs.   Follow Up Plan: [name] to  call client in next 3 weeks to talk with her about relaxation techniques use of client and social work needs of client at that time  [name].Teaghan Melrose MSW, [name] Licensed Clinical Social Worker [facility] Care [facility] [phone]

## 2020-05-17 ENCOUNTER — Telehealth: Payer: Self-pay | Admitting: Family Medicine

## 2020-05-17 NOTE — Telephone Encounter (Signed)
LVM to call and schedule diabetic eye exam.  

## 2020-05-18 ENCOUNTER — Telehealth: Payer: Self-pay

## 2020-05-18 ENCOUNTER — Ambulatory Visit (HOSPITAL_COMMUNITY)
Admission: RE | Admit: 2020-05-18 | Discharge: 2020-05-18 | Disposition: A | Discharge status: Home / Self Care | Payer: Medicare HMO | Source: Ambulatory Visit | Attending: Family Medicine | Admitting: Family Medicine

## 2020-05-18 ENCOUNTER — Other Ambulatory Visit: Payer: Self-pay

## 2020-05-18 DIAGNOSIS — Z1231 Encounter for screening mammogram for malignant neoplasm of breast: Secondary | ICD-10-CM | POA: Diagnosis not present

## 2020-05-25 ENCOUNTER — Ambulatory Visit: Payer: Medicare HMO | Admitting: *Deleted

## 2020-05-25 DIAGNOSIS — I10 Essential (primary) hypertension: Secondary | ICD-10-CM

## 2020-05-25 DIAGNOSIS — N1831 Chronic kidney disease, stage 3a: Secondary | ICD-10-CM

## 2020-05-25 DIAGNOSIS — J439 Emphysema, unspecified: Secondary | ICD-10-CM

## 2020-05-25 NOTE — Chronic Care Management (AMB) (Signed)
  Chronic Care Management   Follow-up Note  05/25/2020 Name: Yvonne [name] MRN: [id number] DOB: [birth year]  Referred by: [name], MD Reason for referral : Chronic Care Management (RN follow up)   An unsuccessful telephone follow-up was attempted today. The patient was referred to the case management team for assistance with care management and care coordination. Chart reviewed prior to outreach. Per EMR, patient was able to get Ozempic prescription assistance and shipment came in to [facility] for her to pickup. Also, she complained of SOB with exertion at her last PCP office visit and stated she was out of Ventolin and Breo. Those were prescribed during the visit. I would like to make sure she was able to pick those up and see if her SOB has improved with use.   Follow Up Plan:   A HIPPA compliant phone message was left for the patient providing contact information and requesting a return call.   The care management team will reach out to the patient again over the next 30 days.   F/u with Dr [name] on 05/31/20  [name], BSN, RN-BC [place] [facility] [place] [facility] Direct Dial: [phone]

## 2020-05-31 ENCOUNTER — Ambulatory Visit: Payer: Medicare HMO | Admitting: Family Medicine

## 2020-06-02 ENCOUNTER — Encounter: Payer: Self-pay | Admitting: Family Medicine

## 2020-06-14 ENCOUNTER — Telehealth: Payer: Self-pay | Admitting: Family Medicine

## 2020-06-14 NOTE — Telephone Encounter (Signed)
Patient aware.

## 2020-06-14 NOTE — Telephone Encounter (Signed)
YES! I highly recommend it!

## 2020-06-20 ENCOUNTER — Ambulatory Visit (INDEPENDENT_AMBULATORY_CARE_PROVIDER_SITE_OTHER): Payer: Medicare HMO | Admitting: Family Medicine

## 2020-06-20 ENCOUNTER — Encounter: Payer: Self-pay | Admitting: Family Medicine

## 2020-06-20 DIAGNOSIS — J40 Bronchitis, not specified as acute or chronic: Secondary | ICD-10-CM | POA: Diagnosis not present

## 2020-06-20 DIAGNOSIS — J329 Chronic sinusitis, unspecified: Secondary | ICD-10-CM

## 2020-06-20 MED ORDER — BENZONATATE 200 MG PO CAPS
200.0000 mg | ORAL_CAPSULE | Freq: Three times a day (TID) | ORAL | 0 refills | Status: DC | PRN
Start: 2020-06-20 — End: 2020-08-02

## 2020-06-20 MED ORDER — AZITHROMYCIN 250 MG PO TABS
ORAL_TABLET | ORAL | 0 refills | Status: DC
Start: 2020-06-20 — End: 2020-08-02

## 2020-06-20 NOTE — Progress Notes (Signed)
Subjective:    Patient ID: Yvonne [name], female    DOB: 1951-05-13, 69 y.o.   MRN: [id number]   HPI: Yvonne [name] is a 69 y.o. female presenting for coughing my head off. Comes and goes. Allergy med not helping. Cough with yellow sputum. Onset a week ago. Felt dyspneic. Rescue inhaler gave relief. More at night. Denies fever. Earache,    Depression screen [facility] 2/9 05/02/2020 05/02/2020 03/01/2020 01/18/2020 09/09/2019  Decreased Interest 1 1 3  0 0  Down, Depressed, Hopeless 0 0 1 0 0  PHQ - 2 Score 1 1 4  0 0  Altered sleeping 3 - 3 - -  Tired, decreased energy 1 - 1 - -  Change in appetite 1 - 3 - -  Feeling bad or failure about yourself  1 - 3 - -  Trouble concentrating 0 - 0 - -  Moving slowly or fidgety/restless 1 - 3 - -  Suicidal thoughts 0 - 0 - -  PHQ-9 Score 8 - 17 - -  Difficult doing work/chores Somewhat difficult - Somewhat difficult - -  Some recent data might be hidden     Relevant past medical, surgical, family and social history reviewed and updated as indicated.  Interim medical history since our last visit reviewed. Allergies and medications reviewed and updated.  ROS:  Review of Systems  Constitutional: Negative.  Negative for fever.  HENT: Positive for congestion and postnasal drip.   Eyes: Negative for visual disturbance.  Respiratory: Positive for cough and shortness of breath.   Cardiovascular: Negative for chest pain.  Gastrointestinal: Negative for abdominal pain.  Musculoskeletal: Negative for arthralgias.     Social History   Tobacco Use  Smoking Status Never Smoker  Smokeless Tobacco Never Used       Objective:     Wt Readings from Last 3 Encounters:  05/02/20 181 lb 4 oz (82.2 kg)  03/23/20 179 lb 9.6 oz (81.5 kg)  03/01/20 187 lb (84.8 kg)     Exam deferred. Pt. Harboring due to COVID 19. Phone visit performed.   Assessment & Plan:   1. Sinobronchitis     Meds ordered this encounter  Medications  . azithromycin (ZITHROMAX  Z-PAK) 250 MG tablet    Sig: Take two right away Then one a day for the next 4 days.    Dispense:  6 each    Refill:  0  . benzonatate (TESSALON) 200 MG capsule    Sig: Take 1 capsule (200 mg total) by mouth 3 (three) times daily as needed for cough.    Dispense:  20 capsule    Refill:  0    No orders of the defined types were placed in this encounter.     Diagnoses and all orders for this visit:  Sinobronchitis  Other orders -     azithromycin (ZITHROMAX Z-PAK) 250 MG tablet; Take two right away Then one a day for the next 4 days. -     benzonatate (TESSALON) 200 MG capsule; Take 1 capsule (200 mg total) by mouth 3 (three) times daily as needed for cough.    Virtual Visit via telephone Note  I discussed the limitations, risks, security and privacy concerns of performing an evaluation and management service by telephone and the availability of in person appointments. The patient was identified with two identifiers. Pt.expressed understanding and agreed to proceed. Pt. Is at home. Dr. [name] is in his office.  Follow Up Instructions:   I  discussed the assessment and treatment plan with the patient. The patient was provided an opportunity to ask questions and all were answered. The patient agreed with the plan and demonstrated an understanding of the instructions.   The patient was advised to call back or seek an in-person evaluation if the symptoms worsen or if the condition fails to improve as anticipated.   Total minutes including chart review and phone contact time: 14   Follow up plan: No follow-ups on file.  [name], MD [place]

## 2020-06-24 ENCOUNTER — Ambulatory Visit (INDEPENDENT_AMBULATORY_CARE_PROVIDER_SITE_OTHER): Payer: Medicare HMO | Admitting: Licensed Clinical Social Worker

## 2020-06-24 DIAGNOSIS — I5032 Chronic diastolic (congestive) heart failure: Secondary | ICD-10-CM | POA: Diagnosis not present

## 2020-06-24 DIAGNOSIS — E785 Hyperlipidemia, unspecified: Secondary | ICD-10-CM | POA: Diagnosis not present

## 2020-06-24 DIAGNOSIS — F411 Generalized anxiety disorder: Secondary | ICD-10-CM

## 2020-06-24 DIAGNOSIS — I10 Essential (primary) hypertension: Secondary | ICD-10-CM

## 2020-06-24 DIAGNOSIS — E119 Type 2 diabetes mellitus without complications: Secondary | ICD-10-CM

## 2020-06-24 NOTE — Patient Instructions (Addendum)
Licensed Clinical Social Worker Visit Information  Goals we discussed today:    .  Client stated: "I want to get help in managing stress and anxiety" (pt-stated)        Current Barriers:   Mental Health Concerns  in patient with Chronic Diagnoses of GAD, HTN, Type 2 DM,HLD, CHF  Financial challenges  Spouse of client is ill  Clinical Social Work Clinical Goal(s):   Over the next 30 days, Yvonne [name] will talk with [name] regarding management of anxiety and stress symptoms of client  Interventions:  Encouraged client to use relaxation techniques of choice to manage anxiety and stress symptoms  Encouraged client to talk regularly with [name] to discuss anxiety and stress symptoms experienced by client  Encouraged client to talk with [name] as needed to discuss nursing needs of client  Encouraged client to talk with her sister as a means of emotional support  Talked with client about relaxation techniques (likes to read, likes to talk vi aphone with her sister)  Talked with client about her completion of ADLs.  Talked with client about vision issues of client  Talked with client about pain issues of client  Talked with client about her upcoming medical appointments  Patient Self Care Activities:   Attends all scheduled provider appointments   Client speaks daily with her sister for emotional support    Client enjoys reading as a way to manage stress   Plan: Client to call [name] as needed to discuss anxiety and stress issues of client [name] to call client in next 4 weeks to discuss relaxation techniques use of client (reading, gardening in flowers) Client to communicate with [name] to discuss client's nursing needs. Client to participate in self care activities of choice Client to communicate with her sister as a means of emotional support. Client to attend scheduled medical appointments  Initial goal documentation      Follow Up Plan:  [name] to call client in  next4weeks to talk with her about relaxation techniques use of client and social work needs of client at that time  Materials Provided: No  The patient verbalized understanding of instructions provided today and declined a print copy of patient instruction materials.   [name].Adyline Huberty MSW, [name] Licensed Clinical Social Worker [facility] Care Management [phone]

## 2020-06-24 NOTE — Chronic Care Management (AMB) (Signed)
Chronic Care Management    Clinical Social Work Follow Up Note  06/24/2020 Name: Yvonne [name] MRN: [id number] DOB: Aug 19, 1951  Yvonne [name] is a 69 y.o. year old female who is a primary care patient of [name], [name], MD. The CCM team was consulted for assistance with [employer] .   Review of patient status, including review of consultants reports, other relevant assessments, and collaboration with appropriate care team members and the patient's provider was performed as part of comprehensive patient evaluation and provision of chronic care management services.    SDOH (Social Determinants of Health) assessments performed: No;risk for tobacco use; risk for depression; risk for stress    Office Visit from 05/02/2020 in [facility]  PHQ-9 Total Score 8       GAD 7 : Generalized Anxiety Score 05/02/2020 12/02/2019 09/09/2019 12/05/2018  Nervous, Anxious, on Edge 1 2 0 3  Control/stop worrying _0 Worry too much - different things _1 Trouble relaxing _2 Restless _3 Easily annoyed or irritable 1 1 0 2  Afraid - awful might happen 0 0 0 3  Total GAD 7 Score _4 Anxiety Difficulty Somewhat difficult Somewhat difficult - Extremely difficult    Outpatient Encounter Medications as of 06/24/2020  Medication Sig Note  . Accu-Chek Softclix Lancets lancets Test BS up to 4 times daily Dx E11.69   . albuterol (VENTOLIN HFA) 108 (90 Base) MCG/ACT inhaler Inhale 2 puffs into the lungs every 6 (six) hours as needed for wheezing or shortness of breath.   . ALPRAZolam (XANAX) 1 MG tablet Take 1 tablet (1 mg total) by mouth 3 (three) times daily.   [name] Kitchen amLODipine (NORVASC) 10 MG tablet TAKE 1 TABLET BY MOUTH DAILY.   [name] Kitchen aspirin 325 MG tablet Take 325 mg by mouth daily.   [name] Kitchen atorvastatin (LIPITOR) 40 MG tablet TAKE 1 TABLET EVERY DAY AT 6PM   . azithromycin (ZITHROMAX Z-PAK) 250 MG tablet Take two right away Then one a day for the next 4 days.   .  benzonatate (TESSALON) 200 MG capsule Take 1 capsule (200 mg total) by mouth 3 (three) times daily as needed for cough.   . Blood Glucose Monitoring Suppl (ACCU-CHEK GUIDE) w/Device KIT Test Bs QID Dx E11.69   . clopidogrel (PLAVIX) 75 MG tablet Take 1 tablet (75 mg total) by mouth daily with breakfast.   . ezetimibe (ZETIA) 10 MG tablet Take 1 tablet (10 mg total) by mouth daily. For cholesterol   . fluticasone furoate-vilanterol (BREO ELLIPTA) 100-25 MCG/INH AEPB Inhale 1 puff into the lungs daily.   . furosemide (LASIX) 20 MG tablet Take 1 tablet (20 mg total) by mouth daily.   [name] Kitchen glipiZIDE (GLUCOTROL) 5 MG tablet Take 1 tablet (5 mg total) by mouth daily before breakfast.   . glucose blood (ACCU-CHEK GUIDE) test strip Test BS up to 4 times daily Dx E11.69   . loratadine (CLARITIN) 10 MG tablet Take 1 tablet (10 mg total) by mouth daily.   . Semaglutide,0.25 or 0.5MG/DOS, (OZEMPIC, 0.25 OR 0.5 MG/DOSE,) 2 MG/1.5ML SOPN Inject 0.5 mg into the skin once a week.  03/16/2020: EVERY Wednesday  . solifenacin (VESICARE) 10 MG tablet Take 1 tablet (10 mg total) by mouth daily.    No facility-administered encounter medications on file as of 06/24/2020.    Goals    .  Client stated: "I  want to get help in managing stress and anxiety" (pt-stated)      Current Barriers:  [name] Kitchen Mental Health Concerns  in patient with Chronic Diagnoses of GAD, HTN, Type 2 DM,HLD, CHF . Financial challenges . Spouse of client is ill  Clinical Social Work Clinical Goal(s):  [name] Kitchen Over the next 30 days, [place] will talk with [name] regarding management of anxiety and stress symptoms of client    Interventions: . Encouraged client to use relaxation techniques of choice to manage anxiety and stress symptoms . Encouraged client to talk regularly with [name] to discuss anxiety and stress symptoms experienced by client . Encouraged client to talk with [name] as needed to discuss nursing needs of client . Encouraged client to talk with her  sister as a means of emotional support . Talked with client about relaxation techniques (likes to read, likes to talk vi aphone with her sister) . Talked with client about her completion of ADLs. . Talked with client about vision issues of client . Talked with client about pain issues of client . Talked with client about her upcoming medical appointments  Patient Self Care Activities:  . Attends all scheduled provider appointments  . Client speaks daily with her sister for emotional support  .  Client enjoys reading as a way to manage stress   Plan: Client to call [name] as needed to discuss anxiety and stress issues of client [name] to call client in next 4 weeks to discuss relaxation techniques use of client (reading, gardening in flowers) Client to communicate with [name] to discuss client's nursing needs. Client to participate in self care activities of choice Client to communicate with her sister as a means of emotional support. Client to attend scheduled medical appointments  Initial goal documentation      Follow Up Plan:  [name] to call client in next 4 weeks to talk with her about relaxation techniques use of client and social work needs of client at that time  [name].Serene Kopf MSW, [name] Licensed Clinical Social Worker [facility] Care Management [phone]

## 2020-07-15 ENCOUNTER — Telehealth: Payer: Medicare HMO | Admitting: *Deleted

## 2020-07-15 ENCOUNTER — Telehealth: Payer: Self-pay | Admitting: *Deleted

## 2020-07-15 NOTE — Telephone Encounter (Signed)
  Chronic Care Management   Follow-up Note  07/15/2020 Name: Yvonne [name] MRN: [id number] DOB: 1951/07/23  Referred by: [name], MD Reason for referral : Chronic Care Management (RN follow up)   An unsuccessful telephone follow-up was attempted today. The patient was referred to the case management team for assistance with care management and care coordination.   Follow Up Plan: The care management team will reach out to the patient again over the next 10 days.   [name], BSN, RN-BC Embedded Chronic Care Manager [facility] / [place] [facility] Direct Dial: [phone]

## 2020-07-20 ENCOUNTER — Telehealth: Payer: Self-pay

## 2020-07-20 NOTE — Chronic Care Management (AMB) (Signed)
  Care Management   Note  07/20/2020 Name: Yvonne [name] MRN: [id number] DOB: 1951-04-11  Yvonne [name] is a 69 y.o. year old female who is a primary care patient of [name], [name], MD and is actively engaged with the care management team. I reached out to [name] by phone today to assist with re-scheduling a follow up visit with the RN Case Manager  Follow up plan: Unsuccessful telephone outreach attempt made. A HIPPA compliant phone message was left for the patient providing contact information and requesting a return call.  The care management team will reach out to the patient again over the next 7 days.  If patient returns call to provider office, please advise to call [facility]  at [place], [place], [place], [place] [address] Direct Dial: [phone] [email or web address] Website: [email or web address]

## 2020-07-27 ENCOUNTER — Telehealth: Payer: Self-pay | Admitting: Pharmacist

## 2020-07-27 NOTE — Telephone Encounter (Signed)
refill requested for Ozempic via [employer] Patient Assistance 4- month supply to ship to PCP office Fax sent to [phone]

## 2020-07-29 ENCOUNTER — Telehealth: Payer: Medicare HMO

## 2020-08-02 ENCOUNTER — Other Ambulatory Visit: Payer: Self-pay

## 2020-08-02 ENCOUNTER — Encounter: Payer: Self-pay | Admitting: Family Medicine

## 2020-08-02 ENCOUNTER — Ambulatory Visit (INDEPENDENT_AMBULATORY_CARE_PROVIDER_SITE_OTHER): Payer: Medicare HMO | Admitting: Family Medicine

## 2020-08-02 VITALS — BP 130/75 | HR 68 | Temp 97.6°F | Ht 60.0 in | Wt 184.8 lb

## 2020-08-02 DIAGNOSIS — E785 Hyperlipidemia, unspecified: Secondary | ICD-10-CM | POA: Diagnosis not present

## 2020-08-02 DIAGNOSIS — F411 Generalized anxiety disorder: Secondary | ICD-10-CM

## 2020-08-02 DIAGNOSIS — E119 Type 2 diabetes mellitus without complications: Secondary | ICD-10-CM

## 2020-08-02 DIAGNOSIS — F1320 Sedative, hypnotic or anxiolytic dependence, uncomplicated: Secondary | ICD-10-CM

## 2020-08-02 DIAGNOSIS — I10 Essential (primary) hypertension: Secondary | ICD-10-CM

## 2020-08-02 LAB — CMP14+EGFR
ALT: 16 IU/L (ref 0–32)
AST: 18 IU/L (ref 0–40)
Albumin/Globulin Ratio: 1.7 (ref 1.2–2.2)
Albumin: 4.3 g/dL (ref 3.8–4.8)
Alkaline Phosphatase: 106 IU/L (ref 48–121)
BUN/Creatinine Ratio: 18 (ref 12–28)
BUN: 19 mg/dL (ref 8–27)
Bilirubin Total: 0.4 mg/dL (ref 0.0–1.2)
CO2: 24 mmol/L (ref 20–29)
Calcium: 9.6 mg/dL (ref 8.7–10.3)
Chloride: 103 mmol/L (ref 96–106)
Creatinine, Ser: 1.06 mg/dL — ABNORMAL HIGH (ref 0.57–1.00)
GFR calc Af Amer: 61 mL/min/{1.73_m2} (ref >59)
GFR calc non Af Amer: 53 mL/min/{1.73_m2} — ABNORMAL LOW (ref >59)
Globulin, Total: 2.5 g/dL (ref 1.5–4.5)
Glucose: 157 mg/dL — ABNORMAL HIGH (ref 65–99)
Potassium: 4.6 mmol/L (ref 3.5–5.2)
Sodium: 142 mmol/L (ref 134–144)
Total Protein: 6.8 g/dL (ref 6.0–8.5)

## 2020-08-02 LAB — CBC WITH DIFFERENTIAL/PLATELET
Basophils Absolute: 0.1 10*3/uL (ref 0.0–0.2)
Basos: 1 %
EOS (ABSOLUTE): 0.2 10*3/uL (ref 0.0–0.4)
Eos: 4 %
Hematocrit: 41.1 % (ref 34.0–46.6)
Hemoglobin: 14.2 g/dL (ref 11.1–15.9)
Immature Grans (Abs): 0 10*3/uL (ref 0.0–0.1)
Immature Granulocytes: 0 %
Lymphocytes Absolute: 2.8 10*3/uL (ref 0.7–3.1)
Lymphs: 42 %
MCH: 29.8 pg (ref 26.6–33.0)
MCHC: 34.5 g/dL (ref 31.5–35.7)
MCV: 86 fL (ref 79–97)
Monocytes Absolute: 0.5 10*3/uL (ref 0.1–0.9)
Monocytes: 8 %
Neutrophils Absolute: 2.9 10*3/uL (ref 1.4–7.0)
Neutrophils: 45 %
Platelets: 280 10*3/uL (ref 150–450)
RBC: 4.76 x10E6/uL (ref 3.77–5.28)
RDW: 12.8 % (ref 11.7–15.4)
WBC: 6.5 10*3/uL (ref 3.4–10.8)

## 2020-08-02 LAB — BAYER DCA HB A1C WAIVED: HB A1C (BAYER DCA - WAIVED): 7.9 % — ABNORMAL HIGH (ref <7.0)

## 2020-08-02 LAB — LIPID PANEL
Chol/HDL Ratio: 4 ratio (ref 0.0–4.4)
Cholesterol, Total: 208 mg/dL — ABNORMAL HIGH (ref 100–199)
HDL: 52 mg/dL (ref >39)
LDL Chol Calc (NIH): 133 mg/dL — ABNORMAL HIGH (ref 0–99)
Triglycerides: 129 mg/dL (ref 0–149)
VLDL Cholesterol Cal: 23 mg/dL (ref 5–40)

## 2020-08-02 MED ORDER — OZEMPIC (0.25 OR 0.5 MG/DOSE) 2 MG/1.5ML ~~LOC~~ SOPN: 0.5000 mg | PEN_INJECTOR | SUBCUTANEOUS | 2 refills | Status: DC

## 2020-08-02 NOTE — Progress Notes (Signed)
Subjective:  Patient ID: Yvonne [name],  female    DOB: [birth year]  Age: 69 y.o.    CC: Follow-up   HPI Yvonne [name] presents for  follow-up of hypertension. Patient has no history of headache chest pain or shortness of breath or recent cough. Patient also denies symptoms of TIA such as numbness weakness lateralizing. Patient denies side effects from medication. States taking it regularly.  Patient also  in for follow-up of elevated cholesterol. Doing well without complaints on current medication. Denies side effects  including myalgia and arthralgia and nausea. Also in today for liver function testing. Currently no chest pain, shortness of breath or other cardiovascular related symptoms noted.  Follow-up of diabetes. Patient does check blood sugar at home. Readings run between 120 and 130 fasting Patient denies symptoms such as excessive hunger or urinary frequency, excessive hunger, nausea No significant hypoglycemic spells noted. Medications reviewed. Pt reports taking them regularly. Pt. denies complication/adverse reaction today.   Patient continues to use alprazolam for anxiety.  Yvonne [name] is taking it 3 times a day 1 mg each time.  Her toxassure was checked at her last visit and was unremarkable.  PDMP score checked today is: Unremarkable except that Yvonne [name] is filling her prescriptions about 3 days of early on average for the last several months.  GAD 7 : Generalized Anxiety Score 08/02/2020 05/02/2020 12/02/2019 09/09/2019  Nervous, Anxious, on Edge 0 1 2 0  Control/stop worrying 3 1 3 1   Worry too much - different things 3 1 3 1   Trouble relaxing 3 3 3 1   Restless 2 1 3 1   Easily annoyed or irritable 0 1 1 0  Afraid - awful might happen 1 0 0 0  Total GAD 7 Score 12 8 15 4   Anxiety Difficulty - Somewhat difficult Somewhat difficult -      History Yvonne [name] has a past medical history of Anxiety, Arthritis, Asthma, Chronic cough, Chronic otitis media (12/2017), Full dentures, GERD  (gastroesophageal reflux disease), History of stroke (09/2017), Hyperlipidemia, Hypertension, Non-insulin dependent type 2 diabetes mellitus ([place]), Overactive bladder, Recurrent acute suppurative otitis media without spontaneous rupture of left tympanic membrane (02/19/2018), and Transient cerebral ischemia.   Yvonne [name] has a past surgical history that includes Cholecystectomy; Colonoscopy (N/A, 08/17/2015); Abdominal hysterectomy; Lacrimal duct exploration (Bilateral); Cataract extraction w/ intraocular lens implant (Right); Vitrectomy (Left, 09/14/2015); Gas insertion (Right); and Myringotomy with tube placement (Bilateral, 01/28/2018).   Her family history includes Arthritis in her brother and mother; Arthritis-Osteo in her son; Asthma in her father; CVA in her mother; Cancer in her brother; Diabetes in her father and mother; Heart disease in her father; Hepatitis C in her sister; Peripheral Artery Disease in her daughter; Stroke in her mother.Yvonne [name] reports that Yvonne [name] has never smoked. Yvonne [name] has never used smokeless tobacco. Yvonne [name] reports that Yvonne [name] does not drink alcohol and does not use drugs.  Current Outpatient Medications on File Prior to Visit  Medication Sig Dispense Refill  . Accu-Chek Softclix Lancets lancets Test BS up to 4 times daily Dx E11.69 400 each 3  . albuterol (VENTOLIN HFA) 108 (90 Base) MCG/ACT inhaler Inhale 2 puffs into the lungs every 6 (six) hours as needed for wheezing or shortness of breath. 54 g 1  . ALPRAZolam (XANAX) 1 MG tablet Take 1 tablet (1 mg total) by mouth 3 (three) times daily. 90 tablet 5  . amLODipine (NORVASC) 10 MG tablet TAKE 1 TABLET BY MOUTH DAILY. 90 tablet 1  . aspirin 325  MG tablet Take 325 mg by mouth daily.    [name] Kitchen atorvastatin (LIPITOR) 40 MG tablet TAKE 1 TABLET EVERY DAY AT 6PM 90 tablet 1  . Blood Glucose Monitoring Suppl (ACCU-CHEK GUIDE) w/Device KIT Test Bs QID Dx E11.69 1 kit 0  . clopidogrel (PLAVIX) 75 MG tablet Take 1 tablet (75 mg total) by mouth daily with  breakfast. 90 tablet 1  . ezetimibe (ZETIA) 10 MG tablet Take 1 tablet (10 mg total) by mouth daily. For cholesterol 90 tablet 1  . fluticasone furoate-vilanterol (BREO ELLIPTA) 100-25 MCG/INH AEPB Inhale 1 puff into the lungs daily. 180 each 1  . furosemide (LASIX) 20 MG tablet Take 1 tablet (20 mg total) by mouth daily. 90 tablet 1  . glipiZIDE (GLUCOTROL) 5 MG tablet Take 1 tablet (5 mg total) by mouth daily before breakfast. 90 tablet 1  . glucose blood (ACCU-CHEK GUIDE) test strip Test BS up to 4 times daily Dx E11.69 400 each 3  . loratadine (CLARITIN) 10 MG tablet Take 1 tablet (10 mg total) by mouth daily. 90 tablet 3  . solifenacin (VESICARE) 10 MG tablet Take 1 tablet (10 mg total) by mouth daily. 90 tablet 1   No current facility-administered medications on file prior to visit.    ROS Review of Systems  Constitutional: Negative.   HENT: Negative for congestion.   Eyes: Negative for visual disturbance.  Respiratory: Negative for shortness of breath.   Cardiovascular: Negative for chest pain.  Gastrointestinal: Negative for abdominal pain, constipation, diarrhea, nausea and vomiting.  Genitourinary: Negative for difficulty urinating.  Musculoskeletal: Negative for arthralgias and myalgias.  Neurological: Negative for headaches.  Psychiatric/Behavioral: Negative for sleep disturbance. The patient is nervous/anxious (increased as a result of CoVID).     Objective:  BP 130/75   Pulse 68   Temp 97.6 F (36.4 C) (Temporal)   Ht 5' (1.524 m)   Wt 184 lb 12.8 oz (83.8 kg)   BMI 36.09 kg/m   BP Readings from Last 3 Encounters:  08/02/20 130/75  05/02/20 131/76  03/23/20 134/78    Wt Readings from Last 3 Encounters:  08/02/20 184 lb 12.8 oz (83.8 kg)  05/02/20 181 lb 4 oz (82.2 kg)  03/23/20 179 lb 9.6 oz (81.5 kg)     Physical Exam Constitutional:      General: Yvonne [name] is not in acute distress.    Appearance: Yvonne [name] is well-developed.  Cardiovascular:     Rate and  Rhythm: Normal rate and regular rhythm.  Pulmonary:     Breath sounds: Normal breath sounds.  Skin:    General: Skin is warm and dry.  Neurological:     Mental Status: Yvonne [name] is alert and oriented to person, place, and time.     Diabetic Foot Exam - Simple   No data filed        Assessment & Plan:   Yvonne [name] was seen today for follow-up.  Diagnoses and all orders for this visit:  Essential hypertension -     CBC with Differential/Platelet -     CMP14+EGFR  Type 2 diabetes mellitus without complication, without long-term current use of insulin (HCC) -     CBC with Differential/Platelet -     CMP14+EGFR -     Bayer DCA Hb A1c Waived  Hyperlipidemia with target LDL less than 70 -     CBC with Differential/Platelet -     CMP14+EGFR -     Lipid panel  Benzodiazepine dependence, continuous (HCC)  GAD (  generalized anxiety disorder)  Other orders -     Semaglutide,0.25 or 0.5MG /DOS, (OZEMPIC, 0.25 OR 0.5 MG/DOSE,) 2 MG/1.5ML SOPN; Inject 0.375 mLs (0.5 mg total) into the skin once a week.   I have discontinued Yvonne [name]'s azithromycin and benzonatate. I have also changed her Ozempic (0.25 or 0.5 MG/DOSE). Additionally, I am having her maintain her loratadine, aspirin, Accu-Chek Guide, Accu-Chek Softclix Lancets, Accu-Chek Guide, albuterol, ALPRAZolam, amLODipine, atorvastatin, clopidogrel, ezetimibe, Breo Ellipta, furosemide, glipiZIDE, and solifenacin.  Meds ordered this encounter  Medications  . Semaglutide,0.25 or 0.5MG /DOS, (OZEMPIC, 0.25 OR 0.5 MG/DOSE,) 2 MG/1.5ML SOPN    Sig: Inject 0.375 mLs (0.5 mg total) into the skin once a week.    Dispense:  1 mL    Refill:  2     Follow-up: Return in about 3 months (around 11/01/2020).  [name], M.D.

## 2020-08-07 ENCOUNTER — Encounter: Payer: Self-pay | Admitting: Family Medicine

## 2020-08-08 ENCOUNTER — Telehealth: Payer: Self-pay | Admitting: Family Medicine

## 2020-08-08 NOTE — Telephone Encounter (Signed)
In labs

## 2020-08-15 NOTE — Chronic Care Management (AMB) (Signed)
  Care Management   Note  08/15/2020 Name: Yvonne [name] MRN: [id number] DOB: [birth year]  [name] is a 69 y.o. year old female who is a primary care patient of [name], [name], MD and is actively engaged with the care management team. I reached out to [name] by phone today to assist with re-scheduling a follow up visit with the RN Case Manager  Follow up plan: Telephone appointment with care management team member scheduled for:08/25/2020  Noreene Larsson, [place], [place], [place] [address] Direct Dial: [phone] [email or web address]: [email or web address]

## 2020-08-15 NOTE — Telephone Encounter (Signed)
Pt has been r/s  

## 2020-08-23 ENCOUNTER — Telehealth: Payer: Self-pay | Admitting: Pharmacist

## 2020-08-23 DIAGNOSIS — R42 Dizziness and giddiness: Secondary | ICD-10-CM | POA: Diagnosis not present

## 2020-08-23 DIAGNOSIS — G459 Transient cerebral ischemic attack, unspecified: Secondary | ICD-10-CM | POA: Diagnosis not present

## 2020-08-23 DIAGNOSIS — I951 Orthostatic hypotension: Secondary | ICD-10-CM | POA: Diagnosis not present

## 2020-08-23 DIAGNOSIS — G5600 Carpal tunnel syndrome, unspecified upper limb: Secondary | ICD-10-CM | POA: Insufficient documentation

## 2020-08-23 DIAGNOSIS — E1142 Type 2 diabetes mellitus with diabetic polyneuropathy: Secondary | ICD-10-CM | POA: Diagnosis not present

## 2020-08-25 ENCOUNTER — Telehealth: Payer: Self-pay | Admitting: *Deleted

## 2020-08-25 ENCOUNTER — Ambulatory Visit (INDEPENDENT_AMBULATORY_CARE_PROVIDER_SITE_OTHER): Payer: Medicare HMO | Admitting: *Deleted

## 2020-08-25 DIAGNOSIS — I10 Essential (primary) hypertension: Secondary | ICD-10-CM

## 2020-08-25 DIAGNOSIS — E119 Type 2 diabetes mellitus without complications: Secondary | ICD-10-CM

## 2020-08-25 NOTE — Telephone Encounter (Signed)
Call placed to patient to pick up patient assistance supply of Ozempic (56-month supply) It is in the fridge labeled for patient to pick up Patient doing great, BGs stable

## 2020-08-25 NOTE — Chronic Care Management (AMB) (Signed)
Chronic Care Management   Follow Up Note   08/25/2020 Name: Yvonne [name] MRN: [id number] DOB: [birth year]  Referred by: [name], MD Reason for referral : Chronic Care Management   Yvonne [name] is a 69 y.o. year old female who is a primary care patient of [name], [name], MD. The CCM team was consulted for assistance with chronic disease management and care coordination needs.    Review of patient status, including review of consultants reports, relevant laboratory and other test results, and collaboration with appropriate care team members and the patient's provider was performed as part of comprehensive patient evaluation and provision of chronic care management services.    SDOH (Social Determinants of Health) assessments performed: No See Care Plan activities for detailed interventions related to [name])     Outpatient Encounter Medications as of 08/25/2020  Medication Sig  . Accu-Chek Softclix Lancets lancets Test BS up to 4 times daily Dx E11.69  . albuterol (VENTOLIN HFA) 108 (90 Base) MCG/ACT inhaler Inhale 2 puffs into the lungs every 6 (six) hours as needed for wheezing or shortness of breath.  . ALPRAZolam (XANAX) 1 MG tablet Take 1 tablet (1 mg total) by mouth 3 (three) times daily.  [name] Kitchen amLODipine (NORVASC) 10 MG tablet TAKE 1 TABLET BY MOUTH DAILY.  [name] Kitchen aspirin 325 MG tablet Take 325 mg by mouth daily.  [name] Kitchen atorvastatin (LIPITOR) 40 MG tablet TAKE 1 TABLET EVERY DAY AT 6PM  . Blood Glucose Monitoring Suppl (ACCU-CHEK GUIDE) w/Device KIT Test Bs QID Dx E11.69  . clopidogrel (PLAVIX) 75 MG tablet Take 1 tablet (75 mg total) by mouth daily with breakfast.  . ezetimibe (ZETIA) 10 MG tablet Take 1 tablet (10 mg total) by mouth daily. For cholesterol  . fluticasone furoate-vilanterol (BREO ELLIPTA) 100-25 MCG/INH AEPB Inhale 1 puff into the lungs daily.  . furosemide (LASIX) 20 MG tablet Take 1 tablet (20 mg total) by mouth daily.  [name] Kitchen glipiZIDE (GLUCOTROL) 5 MG tablet Take 1 tablet  (5 mg total) by mouth daily before breakfast.  . glucose blood (ACCU-CHEK GUIDE) test strip Test BS up to 4 times daily Dx E11.69  . loratadine (CLARITIN) 10 MG tablet Take 1 tablet (10 mg total) by mouth daily.  . Semaglutide,0.25 or 0.5MG/DOS, (OZEMPIC, 0.25 OR 0.5 MG/DOSE,) 2 MG/1.5ML SOPN Inject 0.375 mLs (0.5 mg total) into the skin once a week.  . solifenacin (VESICARE) 10 MG tablet Take 1 tablet (10 mg total) by mouth daily.   No facility-administered encounter medications on file as of 08/25/2020.     Lab Results  Component Value Date   HGBA1C 7.9 (H) 08/02/2020   HGBA1C 9.2 (H) 03/01/2020   HGBA1C 7.9 (H) 09/09/2019   Lab Results  Component Value Date   LDLCALC 133 (H) 08/02/2020   CREATININE 1.06 (H) 08/02/2020   BP Readings from Last 3 Encounters:  08/02/20 130/75  05/02/20 131/76  03/23/20 134/78     Goals Addressed              This Visit's Progress     Patient Stated   .  "I want to keep my blood pressure under control" (pt-stated)   On track     [name] (see longitudinal plan of care for additional care plan information)  Current Barriers:  . Chronic Disease Management support and education needs related to hypertension  Nurse Case Manager Clinical Goal(s):  [name] Kitchen Over the next 30 days, patient will demonstrate improved adherence to prescribed treatment plan  for hypertension as evidenced bychecking and recording blood pressure daily  Interventions:  . Inter-disciplinary care team collaboration (see longitudinal plan of care) . Evaluation of current treatment plan related to hypertension and patient's adherence to plan as established by provider. . Reviewed medications . Chart reviewed including recent office notes and lab results . Encouraged patient to check and record blood pressure daily . Discussed plans with patient for ongoing care management follow up and provided patient with direct contact information for care management team . Reach out  to PCP with any readings outside of recommended range . Reach out to [name] as needed  Patient Self Care Activities:  . Performs ADL's independently . Performs IADL's independently_0  Initial goal documentation and Please see past updates related to this goal by clicking on the "Past Updates" button in the selected goal      .  "I want to keep my blood sugar under control" (pt-stated)        CARE PLAN ENTRY (see longitudinal plan of care for additional care plan information)  Current Barriers:  . Chronic Disease Management support and education needs related to diabetes  Nurse Case Manager Clinical Goal(s):  [name] Kitchen Over the next 7 days, patient will work with [name] to address needs related to diabetes management  Interventions:  . Chart reviewed including recent office notes and lab results . Reviewed and discussed medications . Reviewed and discussed upcoming appointments . Discussed diet and appetite o Eating one meal in the evening. Not hungry during the day.  o Would like more education on diabetes and nutrition management o Provided verbal education on diabetes and nutrition and importance of eating regularly o Will mail resources . Discussed referral to nutritionist/diabetes eductor and patient is agreeable . Message sent to PCP requesting referral to [name] in [place] . Encouraged patient to continue to follow-up with [name], [name] . Encouraged patient to reach out to [name] as needed . Continuing checking blood sugar as advised and report any readings outside of recommended range  Patient Self Care Activities:  . Performs ADL's independently . Performs IADL's independently  Please see past updates related to this goal by clicking on the "Past Updates" button in the selected goal         Other   .  "I want the fluid in my legs to get better"   On track     [name] (see longitudinal plan of care for additional care plan  information)  Current Barriers:  [name] Kitchen Knowledge Deficits related to BLE Edema  Nurse Case Manager Clinical Goal(s):  [name] Kitchen Over the next 10 days, patient will meet with RN Care Manager to address BLE Edema . Over the next 10 days, patient will contact PCP with any new or worsening symptoms . Over the next 10 days patient will elevate feel/legs when sitting and will decrease sodium intake  Interventions:  . Inter-disciplinary care team collaboration (see longitudinal plan of care) . Talked with patient by telephone o Discussed BLE edema on the past couple of days. Has had swelling in the past but not this bad. Swelling is equal in both legs/feet and is somewhat painful.  o No chest pain or SOB . Chart reviewed o Echo in 2020 was essentially normal o Unsure of where heart failure diagnosis on problem list came from . Collaborated with [name] whom she has an appointment with tomorrow . Encouraged patient to elevate feet/legs when sitting . Decrease salt/sodium in  diet . Reviewed and discussed medications: amlodipine and furosemide . Encouraged patient to reach out to PCP with new or worsening symptoms  Patient Self Care Activities:  . Performs ADL's independently . Performs IADL's independently  Initial goal documentation         Plan:   The care management team will reach out to the patient again over the next 45 days.   [name], BSN, RN-BC Embedded Chronic Care Manager [facility] [place] Management Direct Dial: [phone]

## 2020-08-25 NOTE — Patient Instructions (Addendum)
Visit Information  Goals Addressed              This Visit's Progress     Patient Stated   .  "I want to keep my blood pressure under control" (pt-stated)   On track     Yvonne [name] (see longitudinal plan of care for additional care plan information)  Current Barriers:  . Chronic Disease Management support and education needs related to hypertension  Nurse Case Manager Clinical Goal(s):  [name] Kitchen Over the next 30 days, patient will demonstrate improved adherence to prescribed treatment plan for hypertension as evidenced bychecking and recording blood pressure daily  Interventions:  . Inter-disciplinary care team collaboration (see longitudinal plan of care) . Evaluation of current treatment plan related to hypertension and patient's adherence to plan as established by provider. . Reviewed medications . Chart reviewed including recent office notes and lab results . Encouraged patient to check and record blood pressure daily . Discussed plans with patient for ongoing care management follow up and provided patient with direct contact information for care management team . Reach out to PCP with any readings outside of recommended range . Reach out to [facility] as needed  Patient Self Care Activities:  . Performs ADL's independently . Performs IADL's independently\  Initial goal documentation and Please see past updates related to this goal by clicking on the "Past Updates" button in the selected goal      .  "I want to keep my blood sugar under control" (pt-stated)        CARE PLAN ENTRY (see longitudinal plan of care for additional care plan information)  Current Barriers:  . Chronic Disease Management support and education needs related to diabetes  Nurse Case Manager Clinical Goal(s):  [name] Kitchen Over the next 7 days, patient will work with [name] to address needs related to diabetes management  Interventions:  . Chart reviewed including recent office notes and lab  results . Reviewed and discussed medications . Reviewed and discussed upcoming appointments . Discussed diet and appetite o Eating one meal in the evening. Not hungry during the day.  o Would like more education on diabetes and nutrition management o Provided verbal education on diabetes and nutrition and importance of eating regularly o Will mail resources . Discussed referral to nutritionist/diabetes eductor and patient is agreeable . Message sent to PCP requesting referral to [facility] in [place] . Encouraged patient to continue to follow-up with [name], [name] . Encouraged patient to reach out to [facility] as needed . Continuing checking blood sugar as advised and report any readings outside of recommended range  Patient Self Care Activities:  . Performs ADL's independently . Performs IADL's independently  Please see past updates related to this goal by clicking on the "Past Updates" button in the selected goal         Other   .  "I want the fluid in my legs to get better"   On track     Yvonne [name] (see longitudinal plan of care for additional care plan information)  Current Barriers:  [name] Kitchen Knowledge Deficits related to BLE Edema  Nurse Case Manager Clinical Goal(s):  [name] Kitchen Over the next 10 days, patient will meet with RN Care Manager to address BLE Edema . Over the next 10 days, patient will contact PCP with any new or worsening symptoms . Over the next 10 days patient will elevate feel/legs when sitting and will decrease sodium intake  Interventions:  .  Inter-disciplinary care team collaboration (see longitudinal plan of care) . Talked with patient by telephone o Discussed BLE edema on the past couple of days. Has had swelling in the past but not this bad. Swelling is equal in both legs/feet and is somewhat painful.  o No chest pain or SOB . Chart reviewed o Echo in 2020 was essentially normal o Unsure of where heart failure diagnosis on problem list  came from . Collaborated with [name] whom she has an appointment with tomorrow . Encouraged patient to elevate feet/legs when sitting . Decrease salt/sodium in diet . Reviewed and discussed medications: amlodipine and furosemide . Encouraged patient to reach out to PCP with new or worsening symptoms  Patient Self Care Activities:  . Performs ADL's independently . Performs IADL's independently  Initial goal documentation      Patient verbalizes understanding of instructions provided today.   Follow-up Plan The care management team will reach out to the patient again over the next 45 days.   [name], BSN, RN-BC Embedded Chronic Care Manager [facility] / [facility] Care Management Direct Dial: [phone]    Diabetes Mellitus and Nutrition, Adult When you have diabetes (diabetes mellitus), it is very important to have healthy eating habits because your blood sugar (glucose) levels are greatly affected by what you eat and drink. Eating healthy foods in the appropriate amounts, at about the same times every day, can help you:  Control your blood glucose.  Lower your risk of heart disease.  Improve your blood pressure.  Reach or maintain a healthy weight. Every person with diabetes is different, and each person has different needs for a meal plan. Your health care provider may recommend that you work with a diet and nutrition specialist (dietitian) to make a meal plan that is best for you. Your meal plan may vary depending on factors such as:  The calories you need.  The medicines you take.  Your weight.  Your blood glucose, blood pressure, and cholesterol levels.  Your activity level.  Other health conditions you have, such as heart or kidney disease. How do carbohydrates affect me? Carbohydrates, also called carbs, affect your blood glucose level more than any other type of food. Eating carbs naturally raises the amount of glucose in your blood.  Carb counting is a method for keeping track of how many carbs you eat. Counting carbs is important to keep your blood glucose at a healthy level, especially if you use insulin or take certain oral diabetes medicines. It is important to know how many carbs you can safely have in each meal. This is different for every person. Your dietitian can help you calculate how many carbs you should have at each meal and for each snack. Foods that contain carbs include:  Bread, cereal, rice, pasta, and crackers.  Potatoes and corn.  Peas, beans, and lentils.  Milk and yogurt.  Fruit and juice.  Desserts, such as cakes, cookies, ice cream, and candy. How does alcohol affect me? Alcohol can cause a sudden decrease in blood glucose (hypoglycemia), especially if you use insulin or take certain oral diabetes medicines. Hypoglycemia can be a life-threatening condition. Symptoms of hypoglycemia (sleepiness, dizziness, and confusion) are similar to symptoms of having too much alcohol. If your health care provider says that alcohol is safe for you, follow these guidelines:  Limit alcohol intake to no more than 1 drink per day for nonpregnant women and 2 drinks per day for men. One drink equals 12 oz  of beer, 5 oz of wine, or 1 oz of hard liquor.  Do not drink on an empty stomach.  Keep yourself hydrated with water, diet soda, or unsweetened iced tea.  Keep in mind that regular soda, juice, and other mixers may contain a lot of sugar and must be counted as carbs. What are tips for following this plan?  Reading food labels  Start by checking the serving size on the "Nutrition Facts" label of packaged foods and drinks. The amount of calories, carbs, fats, and other nutrients listed on the label is based on one serving of the item. Many items contain more than one serving per package.  Check the total grams (g) of carbs in one serving. You can calculate the number of servings of carbs in one serving by  dividing the total carbs by 15. For example, if a food has 30 g of total carbs, it would be equal to 2 servings of carbs.  Check the number of grams (g) of saturated and trans fats in one serving. Choose foods that have low or no amount of these fats.  Check the number of milligrams (mg) of salt (sodium) in one serving. Most people should limit total sodium intake to less than 2,300 mg per day.  Always check the nutrition information of foods labeled as "low-fat" or "nonfat". These foods may be higher in added sugar or refined carbs and should be avoided.  Talk to your dietitian to identify your daily goals for nutrients listed on the label. Shopping  Avoid buying canned, premade, or processed foods. These foods tend to be high in fat, sodium, and added sugar.  Shop around the outside edge of the grocery store. This includes fresh fruits and vegetables, bulk grains, fresh meats, and fresh dairy. Cooking  Use low-heat cooking methods, such as baking, instead of high-heat cooking methods like deep frying.  Cook using healthy oils, such as olive, canola, or sunflower oil.  Avoid cooking with butter, cream, or high-fat meats. Meal planning  Eat meals and snacks regularly, preferably at the same times every day. Avoid going long periods of time without eating.  Eat foods high in fiber, such as fresh fruits, vegetables, beans, and whole grains. Talk to your dietitian about how many servings of carbs you can eat at each meal.  Eat 4-6 ounces (oz) of lean protein each day, such as lean meat, chicken, fish, eggs, or tofu. One oz of lean protein is equal to: ? 1 oz of meat, chicken, or fish. ? 1 egg. ?  cup of tofu.  Eat some foods each day that contain healthy fats, such as avocado, nuts, seeds, and fish. Lifestyle  Check your blood glucose regularly.  Exercise regularly as told by your health care provider. This may include: ? 150 minutes of moderate-intensity or vigorous-intensity  exercise each week. This could be brisk walking, biking, or water aerobics. ? Stretching and doing strength exercises, such as yoga or weightlifting, at least 2 times a week.  Take medicines as told by your health care provider.  Do not use any products that contain nicotine or tobacco, such as cigarettes and e-cigarettes. If you need help quitting, ask your health care provider.  Work with a Social worker or diabetes educator to identify strategies to manage stress and any emotional and social challenges. Questions to ask a health care provider  Do I need to meet with a diabetes educator?  Do I need to meet with a dietitian?  What number can  I call if I have questions?  When are the best times to check my blood glucose? Where to find more information:  American Diabetes Association: diabetes.org  Academy of Nutrition and Dietetics: www.eatright.[employer] of Diabetes and Digestive and Kidney Diseases (NIH): [employer] Summary  A healthy meal plan will help you control your blood glucose and maintain a healthy lifestyle.  Working with a diet and nutrition specialist (dietitian) can help you make a meal plan that is best for you.  Keep in mind that carbohydrates (carbs) and alcohol have immediate effects on your blood glucose levels. It is important to count carbs and to use alcohol carefully. This information is not intended to replace advice given to you by your health care provider. Make sure you discuss any questions you have with your health care provider. Document Revised: 10/25/2017 Document Reviewed: 12/17/2016 Elsevier Patient Education  2020 [employer].

## 2020-08-25 NOTE — Telephone Encounter (Signed)
08/25/2020  Talked with patient today about management of chronic medical conditions. She is interested in weight and blood sugar management and would like a referral to a nutritionist/diabetes educator. We discussed [employer] in [place].    Reason for Referral: Diabetes and nutrition management   Has the referral been discussed with the patient?: Yes   Designated contact for the referral if not the patient (name/phone number): [phone]   Has the patient seen a specialist for this issue before?: No  If so, who (practice/provider)? N/A   Does the patient have a provider or location preference for the referral?: Jearld Fenton, Linna Hoff Would the patient like to see previous specialist if applicable? N/A   Forwarding to PCP for review and action.   [name], BSN, RN-BC Embedded Chronic Care Manager [facility] / [place] Management Direct Dial: [phone]

## 2020-08-28 NOTE — Telephone Encounter (Signed)
Hy not use Yvonne [name]?

## 2020-08-29 ENCOUNTER — Ambulatory Visit: Payer: Medicare HMO | Admitting: *Deleted

## 2020-08-29 DIAGNOSIS — E119 Type 2 diabetes mellitus without complications: Secondary | ICD-10-CM

## 2020-08-29 NOTE — Telephone Encounter (Signed)
I talked with Yvonne [name] and she can definitely work with Yvonne [name] and then refer to nutritionist/dietician if extra attention is needed. Thank you for the recommendation!  Cyril Mourning

## 2020-08-29 NOTE — Telephone Encounter (Signed)
Awesome. Thanks for the follow up! Yvonne [name]

## 2020-08-29 NOTE — Telephone Encounter (Signed)
She is working with Almyra Free about medication management but I wasn't sure how much she does with nutrition. I hadn't thought about that but I'll be glad to check with her and see if that's something she can talk with Ms Arzola about as well.   Cyril Mourning

## 2020-08-31 NOTE — Chronic Care Management (AMB) (Signed)
  Chronic Care Management   Care Coordination Note   08/29/2020 Name: Yvonne [name] MRN: [id number] DOB: [birth year]  Referred by: [name], MD Reason for referral : Chronic Care Management (care coordination)   Yvonne [name] is a 69 y.o. year old female who is a primary care patient of [name], [name], MD. The CCM team was consulted for assistance with chronic disease management and care coordination needs.     RN Care Plan: Goals Addressed              This Visit's Progress     Patient Stated   .  "I want to keep my blood sugar under control" (pt-stated)        CARE PLAN ENTRY (see longitudinal plan of care for additional care plan information)  Current Barriers:  . Chronic Disease Management support and education needs related to diabetes  Nurse Case Manager Clinical Goal(s):  [name] Kitchen Over the next 7 days, patient will work with [name] to address needs related to diabetes management  Interventions:  . Chart reviewed including recent office notes and lab results . Reviewed and discussed medications . Reviewed and discussed upcoming appointments . Discussed diet and appetite o Eating one meal in the evening. Not hungry during the day.  o Would like more education on diabetes and nutrition management o Provided verbal education on diabetes and nutrition and importance of eating regularly . Diabetes nutrition information printed and mailed to patient . Previously discussed referral to nutritionist/diabetes eductor and patient is agreeable . Collaborated with Dr [name] and [name], [name]. Almyra Free wis working with patient already for medication management and will be glad to work with her on diabetes nutrition information. . Will collaborate with front office staff to schedule a 60 min visit with Almyra Free for diabetes nutrition education. . Encouraged patient to reach out to [facility] as needed . Continuing checking blood sugar as advised and report any readings outside  of recommended range  Patient Self Care Activities:  . Performs ADL's independently . Performs IADL's independently  Please see past updates related to this goal by clicking on the "Past Updates" button in the selected goal           Plan:   The care management team will reach out to the patient again over the next 30 days.    [name], BSN, RN-BC Embedded Chronic Care Manager [facility] / [place] Management Direct Dial: [phone]

## 2020-08-31 NOTE — Patient Instructions (Signed)
Visit Information  Goals Addressed              This Visit's Progress     Patient Stated   .  "I want to keep my blood sugar under control" (pt-stated)        CARE PLAN ENTRY (see longitudinal plan of care for additional care plan information)  Current Barriers:  . Chronic Disease Management support and education needs related to diabetes  Nurse Case Manager Clinical Goal(s):  [name] Kitchen Over the next 7 days, patient will work with [name] to address needs related to diabetes management  Interventions:  . Chart reviewed including recent office notes and lab results . Reviewed and discussed medications . Reviewed and discussed upcoming appointments . Discussed diet and appetite o Eating one meal in the evening. Not hungry during the day.  o Would like more education on diabetes and nutrition management o Provided verbal education on diabetes and nutrition and importance of eating regularly . Diabetes nutrition information printed and mailed to patient . Previously discussed referral to nutritionist/diabetes eductor and patient is agreeable . Collaborated with Dr [name] and [name], [name]. Almyra Free wis working with patient already for medication management and will be glad to work with her on diabetes nutrition information. . Will collaborate with front office staff to schedule a 60 min visit with Almyra Free for diabetes nutrition education. . Encouraged patient to reach out to [facility] as needed . Continuing checking blood sugar as advised and report any readings outside of recommended range  Patient Self Care Activities:  . Performs ADL's independently . Performs IADL's independently  Please see past updates related to this goal by clicking on the "Past Updates" button in the selected goal          Follow-up Plan The care management team will reach out to the patient again over the next 30 days.   [name], BSN, RN-BC Embedded Chronic Care Manager [place]  [facility] / [place] [facility] Direct Dial: [phone]

## 2020-09-02 ENCOUNTER — Ambulatory Visit: Payer: Medicare HMO | Admitting: Licensed Clinical Social Worker

## 2020-09-02 DIAGNOSIS — I10 Essential (primary) hypertension: Secondary | ICD-10-CM

## 2020-09-02 DIAGNOSIS — I5032 Chronic diastolic (congestive) heart failure: Secondary | ICD-10-CM

## 2020-09-02 DIAGNOSIS — F411 Generalized anxiety disorder: Secondary | ICD-10-CM

## 2020-09-02 DIAGNOSIS — E119 Type 2 diabetes mellitus without complications: Secondary | ICD-10-CM

## 2020-09-02 DIAGNOSIS — E785 Hyperlipidemia, unspecified: Secondary | ICD-10-CM

## 2020-09-02 NOTE — Patient Instructions (Addendum)
Licensed Clinical Social Worker Visit Information  Goals we discussed today:   .  Client stated: "I want to get help in managing stress and anxiety" (pt-stated)        Current Barriers:   Mental Health Concerns  in patient with Chronic Diagnoses of GAD, HTN, Type 2 DM,HLD, CHF  Financial challenges  Spouse of client is ill  Clinical Social Work Clinical Goal(s):   Over the next 30 days, Yvonne [name] will talk with [name] regarding management of anxiety and stress symptoms of client  Interventions:  [name] Yvonne [name] had asked [name] to ask client to cal Dr. Lottie [name] to talk with client about pharmacy needs of client and to talk with client about diet or food intake needs of client.  [name] talked with Yvonne [name] about this nursing request today and Yvonne [name] said she would set up appointment with Dr. Lottie [name] to discuss medications of client and food intake of client  Encouraged client to talk with [name] as needed to discuss nursing needs of client  Talked with client about her completion of ADLs.  Talked with client about vision issues of client  Talked with client about pain issues of client  Talked with client about her upcoming medical appointments  Patient Self Care Activities:   Attends all scheduled provider appointments   Client speaks daily with her sister for emotional support    Client enjoys reading as a way to manage stress   Plan: Client to call [name] as needed to discuss anxiety and stress issues of client [name] to call client in next 4 weeks to discuss relaxation techniques use of client (reading, gardening in flowers) Client to communicate with [name] to discuss client's nursing needs. Client to participate in self care activities of choice Client to communicate with her sister as a means of emotional support. Client to attend scheduled medical appointments  Initial goal documentation    Follow Up Plan: [name] to call client in next4weeks to talk with  her about relaxation techniques use of client and social work needs of client at that time  Materials Provided: No  The patient verbalized understanding of instructions provided today and declined a print copy of patient instruction materials.   Yvonne [name].Yvonne [name] MSW, [name] Licensed Clinical Social Worker [facility] [phone]

## 2020-09-02 NOTE — Chronic Care Management (AMB) (Signed)
Chronic Care Management    Clinical Social Work Follow Up Note  09/02/2020 Name: Yvonne [name] MRN: [id number] DOB: July 03, 1951  Yvonne [name] is a 69 y.o. year old female who is a primary care patient of [name], [name], MD. The CCM team was consulted for assistance with [employer] .   Review of patient status, including review of consultants reports, other relevant assessments, and collaboration with appropriate care team members and the patient's provider was performed as part of comprehensive patient evaluation and provision of chronic care management services.    SDOH (Social Determinants of Health) assessments performed: No;risk for depression; risk for stress; risk for physical inactivity; risk for tobacco use    Office Visit from 08/02/2020 in [place]  PHQ-9 Total Score 8     GAD 7 : Generalized Anxiety Score 08/02/2020 05/02/2020 12/02/2019 09/09/2019  Nervous, Anxious, on Edge 0 1 2 0  Control/stop worrying _0 Worry too much - different things _1 Trouble relaxing _2 Restless _3 Easily annoyed or irritable 0 1 1 0  Afraid - awful might happen 1 0 0 0  Total GAD 7 Score _4 Anxiety Difficulty - Somewhat difficult Somewhat difficult -    Outpatient Encounter Medications as of 09/02/2020  Medication Sig  . Accu-Chek Softclix Lancets lancets Test BS up to 4 times daily Dx E11.69  . albuterol (VENTOLIN HFA) 108 (90 Base) MCG/ACT inhaler Inhale 2 puffs into the lungs every 6 (six) hours as needed for wheezing or shortness of breath.  . ALPRAZolam (XANAX) 1 MG tablet Take 1 tablet (1 mg total) by mouth 3 (three) times daily.  [name] Kitchen amLODipine (NORVASC) 10 MG tablet TAKE 1 TABLET BY MOUTH DAILY.  [name] Kitchen aspirin 325 MG tablet Take 325 mg by mouth daily.  [name] Kitchen atorvastatin (LIPITOR) 40 MG tablet TAKE 1 TABLET EVERY DAY AT 6PM  . Blood Glucose Monitoring Suppl (ACCU-CHEK GUIDE) w/Device KIT Test Bs QID Dx E11.69  . clopidogrel (PLAVIX) 75  MG tablet Take 1 tablet (75 mg total) by mouth daily with breakfast.  . ezetimibe (ZETIA) 10 MG tablet Take 1 tablet (10 mg total) by mouth daily. For cholesterol  . fluticasone furoate-vilanterol (BREO ELLIPTA) 100-25 MCG/INH AEPB Inhale 1 puff into the lungs daily.  . furosemide (LASIX) 20 MG tablet Take 1 tablet (20 mg total) by mouth daily.  [name] Kitchen glipiZIDE (GLUCOTROL) 5 MG tablet Take 1 tablet (5 mg total) by mouth daily before breakfast.  . glucose blood (ACCU-CHEK GUIDE) test strip Test BS up to 4 times daily Dx E11.69  . loratadine (CLARITIN) 10 MG tablet Take 1 tablet (10 mg total) by mouth daily.  . Semaglutide,0.25 or 0.5MG/DOS, (OZEMPIC, 0.25 OR 0.5 MG/DOSE,) 2 MG/1.5ML SOPN Inject 0.375 mLs (0.5 mg total) into the skin once a week.  . solifenacin (VESICARE) 10 MG tablet Take 1 tablet (10 mg total) by mouth daily.   No facility-administered encounter medications on file as of 09/02/2020.    Goals    .  Client stated: "I want to get help in managing stress and anxiety" (pt-stated)      Current Barriers:  [name] Kitchen Mental Health Concerns  in patient with Chronic Diagnoses of GAD, HTN, Type 2 DM,HLD, CHF . Financial challenges . Spouse of client is ill  Clinical Social Work Clinical Goal(s):  [name] Kitchen Over the next 30 days, Yvonne [name] will talk with [name]  regarding management of anxiety and stress symptoms of client    Interventions:  [name] [name] had asked [name] to ask client to cal Dr. [name] to talk with client about pharmacy needs of client and to talk with client about diet or food intake needs of client.  [name] talked with Yvonne [name] about this nursing request today and Yvonne [name] said she would set up appointment with Dr. [name] to discuss medications of client and food intake of client  Encouraged client to talk with [name] as needed to discuss nursing needs of client  Talked with client about her completion of ADLs.  Talked with client about vision issues of client  Talked with client  about pain issues of client  Talked with client about her upcoming medical appointments  Patient Self Care Activities:  . Attends all scheduled provider appointments  . Client speaks daily with her sister for emotional support  .  Client enjoys reading as a way to manage stress   Plan: Client to call [name] as needed to discuss anxiety and stress issues of client [name] to call client in next 4 weeks to discuss relaxation techniques use of client (reading, gardening in flowers) Client to communicate with [name] to discuss client's nursing needs. Client to participate in self care activities of choice Client to communicate with her sister as a means of emotional support. Client to attend scheduled medical appointments  Initial goal documentation    Follow Up Plan:  [name] to call client in next4weeks to talk with her about relaxation techniques use of client and social work needs of client at that time  [name].Cecily Lawhorne MSW, [name] Licensed Clinical Social Worker [facility] [phone]

## 2020-09-23 ENCOUNTER — Ambulatory Visit: Payer: Medicare HMO | Admitting: Licensed Clinical Social Worker

## 2020-09-23 DIAGNOSIS — E119 Type 2 diabetes mellitus without complications: Secondary | ICD-10-CM

## 2020-09-23 DIAGNOSIS — E785 Hyperlipidemia, unspecified: Secondary | ICD-10-CM

## 2020-09-23 DIAGNOSIS — I10 Essential (primary) hypertension: Secondary | ICD-10-CM

## 2020-09-23 DIAGNOSIS — F411 Generalized anxiety disorder: Secondary | ICD-10-CM

## 2020-09-23 DIAGNOSIS — I5032 Chronic diastolic (congestive) heart failure: Secondary | ICD-10-CM

## 2020-09-23 NOTE — Patient Instructions (Addendum)
Licensed Clinical Social Worker Visit Information  Materials Provided: No  09/23/2020  Name: Yvonne [name]            MRN: [id number]       DOB: 20-Jul-1951  Yvonne [name] is a 69 y.o. year old female who is a primary care patient of [name], Yvonne Gash, MD. The CCM team was consulted for assistance with Yvonne [name] .   Review of patient status, including review of consultants reports, other relevant assessments, and collaboration with appropriate care team members and the patient's provider was performed as part of comprehensive patient evaluation and provision of chronic care management services.    SDOH (Social Determinants of Health) assessments performed: No; risk for stress; risk for depression; risk for tobacco use; risk for physical inactivity  [name] called client home and cell phone numbers several times today; however, [name] was not able to speak via phone with client today.  [name] did leave phone message for client requesting that client call [name] at [phone]  Follow Up Plan: [name] to call client in next4weeks to talk with her about relaxation techniques use of client and social work needs of client at that time  [name] was not able to speak via phone with client today; thus, the client was not able to verbalize understanding of instructions provided today and was not able to accept or decline a print copy of patient instruction materials.   Yvonne [name].Yvonne [name] MSW, [name] Licensed Clinical Social Worker [facility] [phone]

## 2020-09-23 NOTE — Chronic Care Management (AMB) (Signed)
Chronic Care Management    Clinical Social Work Follow Up Note  09/23/2020 Name: Yvonne [name] MRN: [id number] DOB: 14-Dec-1950  Yvonne [name] is a 69 y.o. year old female who is a primary care patient of [name], [name], MD. The CCM team was consulted for assistance with [employer] .   Review of patient status, including review of consultants reports, other relevant assessments, and collaboration with appropriate care team members and the patient's provider was performed as part of comprehensive patient evaluation and provision of chronic care management services.    SDOH (Social Determinants of Health) assessments performed: No; risk for stress; risk for depression; risk for tobacco use; risk for physical inactivity    Office Visit from 08/02/2020 in [place]  PHQ-9 Total Score 8       GAD 7 : Generalized Anxiety Score 08/02/2020 05/02/2020 12/02/2019 09/09/2019  Nervous, Anxious, on Edge 0 1 2 0  Control/stop worrying _0 Worry too much - different things _1 Trouble relaxing _2 Restless _3 Easily annoyed or irritable 0 1 1 0  Afraid - awful might happen 1 0 0 0  Total GAD 7 Score _4 Anxiety Difficulty - Somewhat difficult Somewhat difficult -    Outpatient Encounter Medications as of 09/23/2020  Medication Sig  . Accu-Chek Softclix Lancets lancets Test BS up to 4 times daily Dx E11.69  . albuterol (VENTOLIN HFA) 108 (90 Base) MCG/ACT inhaler Inhale 2 puffs into the lungs every 6 (six) hours as needed for wheezing or shortness of breath.  . ALPRAZolam (XANAX) 1 MG tablet Take 1 tablet (1 mg total) by mouth 3 (three) times daily.  [name] Kitchen amLODipine (NORVASC) 10 MG tablet TAKE 1 TABLET BY MOUTH DAILY.  [name] Kitchen aspirin 325 MG tablet Take 325 mg by mouth daily.  [name] Kitchen atorvastatin (LIPITOR) 40 MG tablet TAKE 1 TABLET EVERY DAY AT 6PM  . Blood Glucose Monitoring Suppl (ACCU-CHEK GUIDE) w/Device KIT Test Bs QID Dx E11.69  . clopidogrel  (PLAVIX) 75 MG tablet Take 1 tablet (75 mg total) by mouth daily with breakfast.  . ezetimibe (ZETIA) 10 MG tablet Take 1 tablet (10 mg total) by mouth daily. For cholesterol  . fluticasone furoate-vilanterol (BREO ELLIPTA) 100-25 MCG/INH AEPB Inhale 1 puff into the lungs daily.  . furosemide (LASIX) 20 MG tablet Take 1 tablet (20 mg total) by mouth daily.  [name] Kitchen glipiZIDE (GLUCOTROL) 5 MG tablet Take 1 tablet (5 mg total) by mouth daily before breakfast.  . glucose blood (ACCU-CHEK GUIDE) test strip Test BS up to 4 times daily Dx E11.69  . loratadine (CLARITIN) 10 MG tablet Take 1 tablet (10 mg total) by mouth daily.  . Semaglutide,0.25 or 0.5MG/DOS, (OZEMPIC, 0.25 OR 0.5 MG/DOSE,) 2 MG/1.5ML SOPN Inject 0.375 mLs (0.5 mg total) into the skin once a week.  . solifenacin (VESICARE) 10 MG tablet Take 1 tablet (10 mg total) by mouth daily.   No facility-administered encounter medications on file as of 09/23/2020.    [name] called client home and cell phone numbers several times today; however, [name] was not able to speak via phone with client today.  [name] did leave phone message for client requesting that client call [name] at [phone]  Follow Up Plan: [name] to call client in next4weeks to talk with her about relaxation techniques use of client and social work needs of client at that time  [name].Frayda Egley MSW, [name] Licensed Clinical Social Worker [facility] [phone]

## 2020-10-17 ENCOUNTER — Other Ambulatory Visit: Payer: Self-pay | Admitting: Family Medicine

## 2020-10-17 DIAGNOSIS — J439 Emphysema, unspecified: Secondary | ICD-10-CM

## 2020-10-26 ENCOUNTER — Ambulatory Visit: Payer: Medicare HMO | Admitting: Licensed Clinical Social Worker

## 2020-10-26 DIAGNOSIS — E119 Type 2 diabetes mellitus without complications: Secondary | ICD-10-CM

## 2020-10-26 DIAGNOSIS — I10 Essential (primary) hypertension: Secondary | ICD-10-CM

## 2020-10-26 DIAGNOSIS — E785 Hyperlipidemia, unspecified: Secondary | ICD-10-CM

## 2020-10-26 DIAGNOSIS — I5032 Chronic diastolic (congestive) heart failure: Secondary | ICD-10-CM

## 2020-10-26 DIAGNOSIS — F411 Generalized anxiety disorder: Secondary | ICD-10-CM

## 2020-10-26 NOTE — Chronic Care Management (AMB) (Signed)
Chronic Care Management    Clinical Social Work Follow Up Note  10/26/2020 Name: Yvonne [name] MRN: [id number] DOB: [birth year]  Yvonne [name] is a 69 y.o. year old female who is a primary care patient of [name], [name], MD. The CCM team was consulted for assistance with [employer] .   Review of patient status, including review of consultants reports, other relevant assessments, and collaboration with appropriate care team members and the patient's provider was performed as part of comprehensive patient evaluation and provision of chronic care management services.    SDOH (Social Determinants of Health) assessments performed: No; risk for depression; risk for tobacco use; risk for stress; risk for physical inactivity    Office Visit from 08/02/2020 in [place]  PHQ-9 Total Score 8     GAD 7 : Generalized Anxiety Score 08/02/2020 05/02/2020 12/02/2019 09/09/2019  Nervous, Anxious, on Edge 0 1 2 0  Control/stop worrying 3 1 3 1   Worry too much - different things 3 1 3 1   Trouble relaxing 3 3 3 1   Restless 2 1 3 1   Easily annoyed or irritable 0 1 1 0  Afraid - awful might happen 1 0 0 0  Total GAD 7 Score 12 8 15 4   Anxiety Difficulty - Somewhat difficult Somewhat difficult -    Outpatient Encounter Medications as of 10/26/2020  Medication Sig  . Accu-Chek Softclix Lancets lancets Test BS up to 4 times daily Dx E11.69  . albuterol (VENTOLIN HFA) 108 (90 Base) MCG/ACT inhaler TAKE 2 PUFFS BY MOUTH EVERY 6 HOURS AS NEEDED FOR WHEEZE OR SHORTNESS OF BREATH  . ALPRAZolam (XANAX) 1 MG tablet Take 1 tablet (1 mg total) by mouth 3 (three) times daily.  [name] Kitchen amLODipine (NORVASC) 10 MG tablet TAKE 1 TABLET BY MOUTH DAILY.  [name] Kitchen aspirin 325 MG tablet Take 325 mg by mouth daily.  [name] Kitchen atorvastatin (LIPITOR) 40 MG tablet TAKE 1 TABLET EVERY DAY AT 6PM  . Blood Glucose Monitoring Suppl (ACCU-CHEK GUIDE) w/Device KIT Test Bs QID Dx E11.69  . clopidogrel (PLAVIX) 75 MG tablet Take 1  tablet (75 mg total) by mouth daily with breakfast.  . ezetimibe (ZETIA) 10 MG tablet Take 1 tablet (10 mg total) by mouth daily. For cholesterol  . fluticasone furoate-vilanterol (BREO ELLIPTA) 100-25 MCG/INH AEPB Inhale 1 puff into the lungs daily.  . furosemide (LASIX) 20 MG tablet Take 1 tablet (20 mg total) by mouth daily.  [name] Kitchen glipiZIDE (GLUCOTROL) 5 MG tablet Take 1 tablet (5 mg total) by mouth daily before breakfast.  . glucose blood (ACCU-CHEK GUIDE) test strip Test BS up to 4 times daily Dx E11.69  . loratadine (CLARITIN) 10 MG tablet Take 1 tablet (10 mg total) by mouth daily.  . Semaglutide,0.25 or 0.5MG /DOS, (OZEMPIC, 0.25 OR 0.5 MG/DOSE,) 2 MG/1.5ML SOPN Inject 0.375 mLs (0.5 mg total) into the skin once a week.  . solifenacin (VESICARE) 10 MG tablet Take 1 tablet (10 mg total) by mouth daily.   No facility-administered encounter medications on file as of 10/26/2020.    [name] called client home number and cell phone number today but [name] was not able to talk via phone with client today; however, [name] did leave phone message for Letecia requesting that she please return call to [name] at [phone]  Follow Up Plan: [name] to call client in next4weeks to talk with her about relaxation techniques use of client and social work needs of client at that time  [name].Danille Oppedisano  MSW, [name] Licensed Clinical Social Worker [facility] [phone]

## 2020-10-26 NOTE — Patient Instructions (Signed)
Licensed Clinical Social Worker Visit Information  Materials Provided: No  10/26/2020  Name: Yvonne [name]            MRN: [id number]       DOB: [birth year]  Yvonne [name] is a 69 y.o. year old female who is a primary care patient of [name], [name], MD. The CCM team was consulted for assistance with [employer] .   Review of patient status, including review of consultants reports, other relevant assessments, and collaboration with appropriate care team members and the patient's provider was performed as part of comprehensive patient evaluation and provision of chronic care management services.    SDOH (Social Determinants of Health) assessments performed: No; risk for depression; risk for tobacco use; risk for stress; risk for physical inactivity  [name] called client home number and cell phone number today but [name] was not able to talk via phone with client today; however, [name] did leave phone message for Ebelin requesting that she please return call to [name] at [phone]  Follow Up Plan: [name] to call client in next4weeks to talk with her about relaxation techniques use of client and social work needs of client at that time  [name] was not able to speak via phone with client today; thus the patient was not able to verbalize understanding of instructions provided today and was not able to accept or decline a print copy of patient instruction materials.   Yvonne [name] MSW, [name] Licensed Clinical Social Worker [facility] [phone]

## 2020-10-27 ENCOUNTER — Other Ambulatory Visit: Payer: Self-pay | Admitting: Family Medicine

## 2020-10-27 DIAGNOSIS — F411 Generalized anxiety disorder: Secondary | ICD-10-CM

## 2020-10-27 DIAGNOSIS — F1320 Sedative, hypnotic or anxiolytic dependence, uncomplicated: Secondary | ICD-10-CM

## 2020-11-02 ENCOUNTER — Other Ambulatory Visit: Payer: Self-pay

## 2020-11-02 ENCOUNTER — Ambulatory Visit (INDEPENDENT_AMBULATORY_CARE_PROVIDER_SITE_OTHER): Payer: Medicare HMO | Admitting: Family Medicine

## 2020-11-02 VITALS — BP 136/80 | HR 82 | Temp 98.5°F | Resp 20 | Ht 60.0 in | Wt 183.1 lb

## 2020-11-02 DIAGNOSIS — F1320 Sedative, hypnotic or anxiolytic dependence, uncomplicated: Secondary | ICD-10-CM | POA: Diagnosis not present

## 2020-11-02 DIAGNOSIS — E785 Hyperlipidemia, unspecified: Secondary | ICD-10-CM

## 2020-11-02 DIAGNOSIS — F411 Generalized anxiety disorder: Secondary | ICD-10-CM

## 2020-11-02 DIAGNOSIS — J439 Emphysema, unspecified: Secondary | ICD-10-CM

## 2020-11-02 DIAGNOSIS — E119 Type 2 diabetes mellitus without complications: Secondary | ICD-10-CM | POA: Diagnosis not present

## 2020-11-02 DIAGNOSIS — E1122 Type 2 diabetes mellitus with diabetic chronic kidney disease: Secondary | ICD-10-CM | POA: Diagnosis not present

## 2020-11-02 DIAGNOSIS — N1831 Chronic kidney disease, stage 3a: Secondary | ICD-10-CM

## 2020-11-02 DIAGNOSIS — I10 Essential (primary) hypertension: Secondary | ICD-10-CM

## 2020-11-02 DIAGNOSIS — H9203 Otalgia, bilateral: Secondary | ICD-10-CM

## 2020-11-02 DIAGNOSIS — R059 Cough, unspecified: Secondary | ICD-10-CM | POA: Diagnosis not present

## 2020-11-02 LAB — CBC WITH DIFFERENTIAL/PLATELET
Basophils Absolute: 0 10*3/uL (ref 0.0–0.2)
Basos: 1 %
EOS (ABSOLUTE): 0.1 10*3/uL (ref 0.0–0.4)
Eos: 2 %
Hematocrit: 37.6 % (ref 34.0–46.6)
Hemoglobin: 13.2 g/dL (ref 11.1–15.9)
Immature Grans (Abs): 0 10*3/uL (ref 0.0–0.1)
Immature Granulocytes: 0 %
Lymphocytes Absolute: 1.8 10*3/uL (ref 0.7–3.1)
Lymphs: 37 %
MCH: 29.3 pg (ref 26.6–33.0)
MCHC: 35.1 g/dL (ref 31.5–35.7)
MCV: 83 fL (ref 79–97)
Monocytes Absolute: 0.5 10*3/uL (ref 0.1–0.9)
Monocytes: 11 %
Neutrophils Absolute: 2.5 10*3/uL (ref 1.4–7.0)
Neutrophils: 49 %
Platelets: 224 10*3/uL (ref 150–450)
RBC: 4.51 x10E6/uL (ref 3.77–5.28)
RDW: 12.1 % (ref 11.7–15.4)
WBC: 5 10*3/uL (ref 3.4–10.8)

## 2020-11-02 LAB — CMP14+EGFR
ALT: 18 IU/L (ref 0–32)
AST: 17 IU/L (ref 0–40)
Albumin/Globulin Ratio: 1.8 (ref 1.2–2.2)
Albumin: 4.1 g/dL (ref 3.8–4.8)
Alkaline Phosphatase: 130 IU/L — ABNORMAL HIGH (ref 44–121)
BUN/Creatinine Ratio: 15 (ref 12–28)
BUN: 15 mg/dL (ref 8–27)
Bilirubin Total: 0.5 mg/dL (ref 0.0–1.2)
CO2: 24 mmol/L (ref 20–29)
Calcium: 9.6 mg/dL (ref 8.7–10.3)
Chloride: 100 mmol/L (ref 96–106)
Creatinine, Ser: 1.01 mg/dL — ABNORMAL HIGH (ref 0.57–1.00)
GFR calc Af Amer: 65 mL/min/{1.73_m2} (ref >59)
GFR calc non Af Amer: 57 mL/min/{1.73_m2} — ABNORMAL LOW (ref >59)
Globulin, Total: 2.3 g/dL (ref 1.5–4.5)
Glucose: 150 mg/dL — ABNORMAL HIGH (ref 65–99)
Potassium: 4 mmol/L (ref 3.5–5.2)
Sodium: 139 mmol/L (ref 134–144)
Total Protein: 6.4 g/dL (ref 6.0–8.5)

## 2020-11-02 LAB — PLEASE NOTE:

## 2020-11-02 LAB — INFLUENZA A AND B
Influenza A Ag, EIA: NEGATIVE
Influenza B Ag, EIA: NEGATIVE

## 2020-11-02 LAB — LIPID PANEL
Chol/HDL Ratio: 3.8 ratio (ref 0.0–4.4)
Cholesterol, Total: 165 mg/dL (ref 100–199)
HDL: 44 mg/dL (ref >39)
LDL Chol Calc (NIH): 100 mg/dL — ABNORMAL HIGH (ref 0–99)
Triglycerides: 114 mg/dL (ref 0–149)
VLDL Cholesterol Cal: 21 mg/dL (ref 5–40)

## 2020-11-02 LAB — BAYER DCA HB A1C WAIVED: HB A1C (BAYER DCA - WAIVED): 8.2 % — ABNORMAL HIGH (ref <7.0)

## 2020-11-02 MED ORDER — ATORVASTATIN CALCIUM 40 MG PO TABS: ORAL_TABLET | ORAL | 1 refills | Status: DC

## 2020-11-02 MED ORDER — FUROSEMIDE 20 MG PO TABS
20.0000 mg | ORAL_TABLET | Freq: Every day | ORAL | 1 refills | Status: DC
Start: 2020-11-02 — End: 2022-03-01

## 2020-11-02 MED ORDER — ALPRAZOLAM 1 MG PO TABS: 1.0000 mg | ORAL_TABLET | Freq: Three times a day (TID) | ORAL | 2 refills | Status: DC

## 2020-11-02 MED ORDER — CANAGLIFLOZIN 300 MG PO TABS: 300.0000 mg | ORAL_TABLET | Freq: Every day | ORAL | 2 refills | Status: DC

## 2020-11-02 MED ORDER — OZEMPIC (0.25 OR 0.5 MG/DOSE) 2 MG/1.5ML ~~LOC~~ SOPN: 0.5000 mg | PEN_INJECTOR | SUBCUTANEOUS | 2 refills | Status: DC

## 2020-11-02 MED ORDER — AMLODIPINE BESYLATE 10 MG PO TABS: ORAL_TABLET | ORAL | 1 refills | Status: DC

## 2020-11-02 MED ORDER — GLIPIZIDE 5 MG PO TABS: 5.0000 mg | ORAL_TABLET | Freq: Every day | ORAL | 1 refills | Status: DC

## 2020-11-02 MED ORDER — SOLIFENACIN SUCCINATE 10 MG PO TABS
10.0000 mg | ORAL_TABLET | Freq: Every day | ORAL | 1 refills | Status: DC
Start: 2020-11-02 — End: 2021-07-12

## 2020-11-02 MED ORDER — CLOPIDOGREL BISULFATE 75 MG PO TABS
75.0000 mg | ORAL_TABLET | Freq: Every day | ORAL | 1 refills | Status: DC
Start: 2020-11-02 — End: 2021-07-17

## 2020-11-02 MED ORDER — BREO ELLIPTA 100-25 MCG/INH IN AEPB: 1.0000 | INHALATION_SPRAY | Freq: Every day | RESPIRATORY_TRACT | 1 refills | Status: DC

## 2020-11-02 MED ORDER — EZETIMIBE 10 MG PO TABS: 10.0000 mg | ORAL_TABLET | Freq: Every day | ORAL | 1 refills | Status: DC

## 2020-11-04 DIAGNOSIS — R059 Cough, unspecified: Secondary | ICD-10-CM | POA: Diagnosis not present

## 2020-11-04 DIAGNOSIS — J329 Chronic sinusitis, unspecified: Secondary | ICD-10-CM | POA: Diagnosis not present

## 2020-11-04 DIAGNOSIS — R0981 Nasal congestion: Secondary | ICD-10-CM | POA: Diagnosis not present

## 2020-11-04 DIAGNOSIS — R52 Pain, unspecified: Secondary | ICD-10-CM | POA: Diagnosis not present

## 2020-11-04 DIAGNOSIS — J101 Influenza due to other identified influenza virus with other respiratory manifestations: Secondary | ICD-10-CM | POA: Diagnosis not present

## 2020-11-06 ENCOUNTER — Encounter: Payer: Self-pay | Admitting: Family Medicine

## 2020-11-06 NOTE — Progress Notes (Signed)
Subjective:  Patient ID: Yvonne [name], female    DOB: [birth year]  Age: 69 y.o. MRN: [id number]  CC: No chief complaint on file.   HPI DARON BREEDING presents forFollow-up of diabetes. Patient checks blood sugar at home.  Both fasting and postprandial glucoses have been less than 160 on frequent checks for greater than a month. Patient denies symptoms such as polyuria, polydipsia, excessive hunger, nausea No significant hypoglycemic spells noted. Medications reviewed. Pt reports taking them regularly without complication/adverse reaction being reported today.  She is having trouble with her recurrent ear pain again. This time is going on a couple of weeks. She does have some sinus congestion postnasal drainage and is having a lot of nighttime cough.  Of note is that her neurologist has told her to discontinue aspirin. She is having some seizure-like spells since discontinuation. She does continue the Plavix. She was taking Plavix with aspirin. She resumed the aspirin a week or so ago and all of these tremors spells resolved. She says with each spell she will feel tingling in the feet and it goes up the side of her right leg.  Depression screen [facility] 2/9 11/02/2020 08/02/2020 05/02/2020 05/02/2020 03/01/2020  Decreased Interest 1 1 1 1 3   Down, Depressed, Hopeless 3 2 0 0 1  PHQ - 2 Score 4 3 1 1 4   Altered sleeping 2 3 3  - 3  Tired, decreased energy 1 0 1 - 1  Change in appetite 1 2 1  - 3  Feeling bad or failure about yourself  2 0 1 - 3  Trouble concentrating 0 0 0 - 0  Moving slowly or fidgety/restless 0 0 1 - 3  Suicidal thoughts 0 0 0 - 0  PHQ-9 Score 10 8 8  - 17  Difficult doing work/chores - Somewhat difficult Somewhat difficult - Somewhat difficult  Some recent data might be hidden   GAD 7 : Generalized Anxiety Score 11/02/2020 08/02/2020 05/02/2020 12/02/2019  Nervous, Anxious, on Edge 1 0 1 2  Control/stop worrying 2 3 1 3   Worry too much - different things 2 3 1 3   Trouble relaxing 2 3 3 3    Restless 1 2 1 3   Easily annoyed or irritable 2 0 1 1  Afraid - awful might happen 1 1 0 0  Total GAD 7 Score 11 12 8 15   Anxiety Difficulty - - Somewhat difficult Somewhat difficult      History Tanija has a past medical history of Anxiety, Arthritis, Asthma, Chronic cough, Chronic otitis media (12/2017), Full dentures, GERD (gastroesophageal reflux disease), History of stroke (09/2017), Hyperlipidemia, Hypertension, Non-insulin dependent type 2 diabetes mellitus ([place]), Overactive bladder, Recurrent acute suppurative otitis media without spontaneous rupture of left tympanic membrane (02/19/2018), and Transient cerebral ischemia.   She has a past surgical history that includes Cholecystectomy; Colonoscopy (N/A, 08/17/2015); Abdominal hysterectomy; Lacrimal duct exploration (Bilateral); Cataract extraction w/ intraocular lens implant (Right); Vitrectomy (Left, 09/14/2015); Gas insertion (Right); and Myringotomy with tube placement (Bilateral, 01/28/2018).   Her family history includes Arthritis in her brother and mother; Arthritis-Osteo in her son; Asthma in her father; CVA in her mother; Cancer in her brother; Diabetes in her father and mother; Heart disease in her father; Hepatitis C in her sister; Peripheral Artery Disease in her daughter; Stroke in her mother.She reports that she has never smoked. She has never used smokeless tobacco. She reports that she does not drink alcohol and does not use drugs.  Current Outpatient Medications on File  Prior to Visit  Medication Sig Dispense Refill  . Accu-Chek Softclix Lancets lancets Test BS up to 4 times daily Dx E11.69 400 each 3  . albuterol (VENTOLIN HFA) 108 (90 Base) MCG/ACT inhaler TAKE 2 PUFFS BY MOUTH EVERY 6 HOURS AS NEEDED FOR WHEEZE OR SHORTNESS OF BREATH 54 each 0  . aspirin 325 MG tablet Take 325 mg by mouth daily.    . Blood Glucose Monitoring Suppl (ACCU-CHEK GUIDE) w/Device KIT Test Bs QID Dx E11.69 1 kit 0  . glucose blood (ACCU-CHEK  GUIDE) test strip Test BS up to 4 times daily Dx E11.69 400 each 3  . loratadine (CLARITIN) 10 MG tablet Take 1 tablet (10 mg total) by mouth daily. 90 tablet 3   No current facility-administered medications on file prior to visit.    ROS Review of Systems  Constitutional: Negative.   HENT: Positive for ear pain. Negative for congestion.   Eyes: Negative for visual disturbance.  Respiratory: Negative for shortness of breath.   Cardiovascular: Negative for chest pain.  Gastrointestinal: Negative for abdominal pain, constipation, diarrhea, nausea and vomiting.  Genitourinary: Negative for difficulty urinating.  Musculoskeletal: Negative for arthralgias and myalgias.  Neurological: Positive for tremors. Negative for headaches.  Psychiatric/Behavioral: Negative for sleep disturbance.    Objective:  BP 136/80   Pulse 82   Temp 98.5 F (36.9 C) (Temporal)   Resp 20   Ht 5' (1.524 m)   Wt 183 lb 2 oz (83.1 kg)   SpO2 100%   BMI 35.76 kg/m   BP Readings from Last 3 Encounters:  11/02/20 136/80  08/02/20 130/75  05/02/20 131/76    Wt Readings from Last 3 Encounters:  11/02/20 183 lb 2 oz (83.1 kg)  08/02/20 184 lb 12.8 oz (83.8 kg)  05/02/20 181 lb 4 oz (82.2 kg)     Physical Exam Constitutional:      General: She is not in acute distress.    Appearance: She is well-developed and well-nourished.  HENT:     Head: Normocephalic and atraumatic.  Eyes:     Conjunctiva/sclera: Conjunctivae normal.     Pupils: Pupils are equal, round, and reactive to light.  Neck:     Thyroid: No thyromegaly.  Cardiovascular:     Rate and Rhythm: Normal rate and regular rhythm.     Heart sounds: Normal heart sounds. No murmur heard.   Pulmonary:     Effort: Pulmonary effort is normal. No respiratory distress.     Breath sounds: Normal breath sounds. No wheezing or rales.  Abdominal:     General: Bowel sounds are normal. There is no distension.     Palpations: Abdomen is soft.      Tenderness: There is no abdominal tenderness.  Musculoskeletal:        General: Normal range of motion.     Cervical back: Normal range of motion and neck supple.  Lymphadenopathy:     Cervical: No cervical adenopathy.  Skin:    General: Skin is warm and dry.  Neurological:     Mental Status: She is alert and oriented to person, place, and time.  Psychiatric:        Mood and Affect: Mood and affect normal.        Behavior: Behavior normal.        Thought Content: Thought content normal.        Judgment: Judgment normal.       Assessment & Plan:   Diagnoses and all orders  for this visit:  Hyperlipidemia with target LDL less than 70 -     CBC with Differential/Platelet -     CMP14+EGFR -     Lipid panel -     ezetimibe (ZETIA) 10 MG tablet; Take 1 tablet (10 mg total) by mouth daily. For cholesterol -     atorvastatin (LIPITOR) 40 MG tablet; TAKE 1 TABLET EVERY DAY AT 6PM  Type 2 diabetes mellitus without complication, without long-term current use of insulin (HCC) -     Bayer DCA Hb A1c Waived -     CBC with Differential/Platelet -     CMP14+EGFR -     Lipid panel  Essential hypertension -     CBC with Differential/Platelet -     CMP14+EGFR -     Lipid panel -     amLODipine (NORVASC) 10 MG tablet; TAKE 1 TABLET BY MOUTH DAILY.  Type 2 diabetes mellitus with stage 3a chronic kidney disease, without long-term current use of insulin (HCC) -     glipiZIDE (GLUCOTROL) 5 MG tablet; Take 1 tablet (5 mg total) by mouth daily before breakfast. -     ezetimibe (ZETIA) 10 MG tablet; Take 1 tablet (10 mg total) by mouth daily. For cholesterol -     atorvastatin (LIPITOR) 40 MG tablet; TAKE 1 TABLET EVERY DAY AT 6PM  Pulmonary emphysema, unspecified emphysema type (HCC) -     fluticasone furoate-vilanterol (BREO ELLIPTA) 100-25 MCG/INH AEPB; Inhale 1 puff into the lungs daily.  GAD (generalized anxiety disorder) -     ALPRAZolam (XANAX) 1 MG tablet; Take 1 tablet (1 mg total)  by mouth 3 (three) times daily.  Benzodiazepine dependence, continuous (HCC) -     ALPRAZolam (XANAX) 1 MG tablet; Take 1 tablet (1 mg total) by mouth 3 (three) times daily.  Cough -     Influenza a and b  Otalgia of both ears -     Ambulatory referral to ENT  Other orders -     solifenacin (VESICARE) 10 MG tablet; Take 1 tablet (10 mg total) by mouth daily. -     Semaglutide,0.25 or 0.5MG /DOS, (OZEMPIC, 0.25 OR 0.5 MG/DOSE,) 2 MG/1.5ML SOPN; Inject 0.5 mg into the skin once a week. -     furosemide (LASIX) 20 MG tablet; Take 1 tablet (20 mg total) by mouth daily. -     clopidogrel (PLAVIX) 75 MG tablet; Take 1 tablet (75 mg total) by mouth daily with breakfast. -     canagliflozin (INVOKANA) 300 MG TABS tablet; Take 1 tablet (300 mg total) by mouth daily before breakfast. -     Please note:      I have changed [name] Covino's Ozempic (0.25 or 0.5 MG/DOSE). I am also having her start on canagliflozin. Additionally, I am having her maintain her loratadine, aspirin, Accu-Chek Guide, Accu-Chek Softclix Lancets, Accu-Chek Guide, albuterol, solifenacin, glipiZIDE, furosemide, Breo Ellipta, ezetimibe, clopidogrel, atorvastatin, amLODipine, and ALPRAZolam.  Meds ordered this encounter  Medications  . solifenacin (VESICARE) 10 MG tablet    Sig: Take 1 tablet (10 mg total) by mouth daily.    Dispense:  90 tablet    Refill:  1  . Semaglutide,0.25 or 0.5MG /DOS, (OZEMPIC, 0.25 OR 0.5 MG/DOSE,) 2 MG/1.5ML SOPN    Sig: Inject 0.5 mg into the skin once a week.    Dispense:  1 mL    Refill:  2  . glipiZIDE (GLUCOTROL) 5 MG tablet    Sig: Take 1 tablet (5  mg total) by mouth daily before breakfast.    Dispense:  90 tablet    Refill:  1  . furosemide (LASIX) 20 MG tablet    Sig: Take 1 tablet (20 mg total) by mouth daily.    Dispense:  90 tablet    Refill:  1  . fluticasone furoate-vilanterol (BREO ELLIPTA) 100-25 MCG/INH AEPB    Sig: Inhale 1 puff into the lungs daily.    Dispense:  180  each    Refill:  1  . ezetimibe (ZETIA) 10 MG tablet    Sig: Take 1 tablet (10 mg total) by mouth daily. For cholesterol    Dispense:  90 tablet    Refill:  1  . clopidogrel (PLAVIX) 75 MG tablet    Sig: Take 1 tablet (75 mg total) by mouth daily with breakfast.    Dispense:  90 tablet    Refill:  1  . atorvastatin (LIPITOR) 40 MG tablet    Sig: TAKE 1 TABLET EVERY DAY AT 6PM    Dispense:  90 tablet    Refill:  1  . amLODipine (NORVASC) 10 MG tablet    Sig: TAKE 1 TABLET BY MOUTH DAILY.    Dispense:  90 tablet    Refill:  1  . ALPRAZolam (XANAX) 1 MG tablet    Sig: Take 1 tablet (1 mg total) by mouth 3 (three) times daily.    Dispense:  90 tablet    Refill:  2  . canagliflozin (INVOKANA) 300 MG TABS tablet    Sig: Take 1 tablet (300 mg total) by mouth daily before breakfast.    Dispense:  30 tablet    Refill:  2     Follow-up: Return in about 3 months (around 01/31/2021).  [name], M.D.

## 2020-11-09 NOTE — Progress Notes (Signed)
Hello Yvonne [name],  Your lab result is normal and/or stable.Some minor variations that are not significant are commonly marked abnormal, but do not represent any medical problem for you.  Best regards, Surya Schroeter, M.D.

## 2020-11-14 ENCOUNTER — Telehealth (INDEPENDENT_AMBULATORY_CARE_PROVIDER_SITE_OTHER): Payer: Medicare HMO | Admitting: Pharmacist

## 2020-11-14 DIAGNOSIS — E119 Type 2 diabetes mellitus without complications: Secondary | ICD-10-CM

## 2020-11-14 MED ORDER — DAPAGLIFLOZIN PROPANEDIOL 10 MG PO TABS
10.0000 mg | ORAL_TABLET | Freq: Every day | ORAL | 3 refills | Status: DC
Start: 2020-11-14 — End: 2021-01-20

## 2020-11-14 NOTE — Progress Notes (Signed)
    11/14/2020 Name: Yvonne [name] MRN: [id number] DOB: 06/12/1951   S:  63 yoF Presents for diabetes evaluation, education, and management Patient was referred and last seen by Primary Care Provider on 11/02/20.  Patient visit virtually in the context of Covid-19 pandemic.   I connected with Amit Meloy on 11/14/2020 at 10:04am by video and verified that I am speaking with the correct person using two identifiers.   I discussed the limitations, risks, security and privacy concerns of performing an evaluation and management service by video and the availability of in person appointments. I also discussed with the patient that there may be a patient responsible charge related to this service. The patient expressed understanding and agreed to proceed.   Patient location:  home My Location:  PCP office Persons on the video call:  2  Insurance coverage/medication affordability: [place]  Patient reports adherence with medications. . Current diabetes medications include: ozempic, newly added invokana, glipizide . Current hypertension medications include: amlodipine (ACEi induced cough) Goal 130/80 . Current hyperlipidemia medications include: atorvastatin   Patient denies hypoglycemic events.     O:  Lab Results  Component Value Date   HGBA1C 8.2 (H) 11/02/2020           Lipid Panel     Component Value Date/Time   CHOL 165 11/02/2020 1010   TRIG 114 11/02/2020 1010   HDL 44 11/02/2020 1010   CHOLHDL 3.8 11/02/2020 1010   CHOLHDL 5.7 06/11/2019 0622   VLDL 39 06/11/2019 0622   LDLCALC 100 (H) 11/02/2020 1010    Home fasting blood sugars: <150  2 hour post-meal/random blood sugars: n/a.   The 10-year ASCVD risk score Mikey Bussing DC Brooke Bonito., et al., 2013) is: 29.1%   Values used to calculate the score:     Age: 65 years     Sex: Female     Is Non-Hispanic African American: No     Diabetic: Yes     Tobacco smoker: No     Systolic Blood Pressure: 751 mmHg     Is BP  treated: Yes     HDL Cholesterol: 44 mg/dL     Total Cholesterol: 165 mg/dL    A/P:  Diabetes T2DM, A1c slightly increased to 8.2% (7.9%).  Patient is adherent with medication.    -Increase Ozempic to 0.5mg  sq weekly  Patient to be re-enrolled in [employer] patient assistance program for 2022.  Awaiting patient's most recent financials.  Once approved, medication to ship to PCP office  -Patient to transition to [place] 10mg  daily based on patient assistance program.  She will discontinue Invokana once she receives patient assistance supply from [employer].  Medication will ship to patient via mail to patient's home  Counseled Sick day rules: if sick, vomiting, have diarrhea, or cannot drink enough fluids, you should stop taking SGLT-2 inhibitors until your symptoms go away. In rare cases, these medicines can cause diabetic ketoacidosis (DKA). DKA is acid buildup in the blood.  -Continue glipizide for now--will aim to d/c as GLP1 and SGLT2 titrated  -Extensively discussed pathophysiology of diabetes, recommended lifestyle interventions, dietary effects on blood sugar control  -Counseled on s/sx of and management of hypoglycemia  -Next A1C anticipated 02/01/21.  Written patient instructions provided.  Total time in face to face counseling 20 minutes.   Follow up PCP Clinic Visit ON 02/01/21.   Regina Eck, [name], BCPS Clinical Pharmacist, [place]  [facility] Phone [phone]

## 2020-11-29 ENCOUNTER — Ambulatory Visit: Payer: Medicare HMO | Admitting: Family Medicine

## 2020-11-29 ENCOUNTER — Ambulatory Visit (INDEPENDENT_AMBULATORY_CARE_PROVIDER_SITE_OTHER): Payer: Medicare HMO | Admitting: Family Medicine

## 2020-11-29 ENCOUNTER — Encounter: Payer: Self-pay | Admitting: Family Medicine

## 2020-11-29 ENCOUNTER — Other Ambulatory Visit: Payer: Self-pay

## 2020-11-29 VITALS — BP 124/72 | HR 72 | Temp 97.4°F | Ht 60.0 in | Wt 180.2 lb

## 2020-11-29 DIAGNOSIS — D485 Neoplasm of uncertain behavior of skin: Secondary | ICD-10-CM | POA: Diagnosis not present

## 2020-11-29 DIAGNOSIS — L309 Dermatitis, unspecified: Secondary | ICD-10-CM | POA: Diagnosis not present

## 2020-11-29 MED ORDER — TRIAMCINOLONE ACETONIDE 0.1 % EX CREA: 1.0000 "application " | TOPICAL_CREAM | Freq: Three times a day (TID) | CUTANEOUS | 0 refills | Status: DC

## 2020-11-29 NOTE — Progress Notes (Signed)
Chief Complaint  Patient presents with  . Rash    Lower, Mid back     HPI  Patient presents today for rash that itches.  This has been present for a couple of weeks.  It has not spread.  It remains in one place at the right lower back.  She points to the paraspinous area at approximately L3.  PMH: Smoking status noted ROS: Per HPI  Objective: BP 124/72   Pulse 72   Temp (!) 97.4 F (36.3 C) (Temporal)   Ht 5' (1.524 m)   Wt 180 lb 3.2 oz (81.7 kg)   BMI 35.19 kg/m  Gen: NAD, alert, cooperative with exam HEENT: NCAT, EOMI, PERRL Skin: There is 3 cm vague erythematous region that may be more hyperemia from the patient scratching profusely during exam.  This is surrounding a 2 mm papule without any vesiculation or scale. Neuro: Alert and oriented, No gross deficits  Assessment and plan:  1. Neoplasm of uncertain behavior of skin     Meds ordered this encounter  Medications  . triamcinolone (KENALOG) 0.1 %    Sig: Apply 1 application topically 3 (three) times daily.    Dispense:  30 each    Refill:  0    Orders Placed This Encounter  Procedures  . Ambulatory referral to Dermatology    Referral Priority:   Routine    Referral Type:   Consultation    Referral Reason:   Specialty Services Required    Referred to Provider:   Marcelino Duster, MD    Requested Specialty:   Dermatology    Number of Visits Requested:   1    Follow up as needed.  Mechele Claude, MD

## 2020-12-05 ENCOUNTER — Telehealth: Payer: Medicare HMO

## 2020-12-08 ENCOUNTER — Encounter: Payer: Self-pay | Admitting: Family Medicine

## 2020-12-08 ENCOUNTER — Ambulatory Visit (INDEPENDENT_AMBULATORY_CARE_PROVIDER_SITE_OTHER): Payer: Medicare HMO | Admitting: Family Medicine

## 2020-12-08 DIAGNOSIS — J0100 Acute maxillary sinusitis, unspecified: Secondary | ICD-10-CM

## 2020-12-08 MED ORDER — AZITHROMYCIN 250 MG PO TABS: ORAL_TABLET | ORAL | 0 refills | Status: DC

## 2020-12-08 NOTE — Progress Notes (Signed)
Virtual Visit via telephone Note  I connected with Yvonne [name] on 12/08/20 at 1712 by telephone and verified that I am speaking with the correct person using two identifiers. Yvonne [name] is currently located at home and patient are currently with her during visit. The provider, Fransisca Kaufmann Dettinger, MD is located in their office at time of visit.  Call ended at 1723  I discussed the limitations, risks, security and privacy concerns of performing an evaluation and management service by telephone and the availability of in person appointments. I also discussed with the patient that there may be a patient responsible charge related to this service. The patient expressed understanding and agreed to proceed.   History and Present Illness: Patient is having sinus pressure and pain in her left ear and sinus pressure and worsened yesterday.  She denies any fevers or chills.  She is having her normal wheezing but nothing in her chest yet.  She has not used OTC meds.  She did get covid vaccine but not flu vaccine. She has not needed her albuterol inhaler yet.  She says it worsened a lot yesterday into today.  No diagnosis found.  Outpatient Encounter Medications as of 12/08/2020  Medication Sig  . Accu-Chek Softclix Lancets lancets Test BS up to 4 times daily Dx E11.69  . albuterol (VENTOLIN HFA) 108 (90 Base) MCG/ACT inhaler TAKE 2 PUFFS BY MOUTH EVERY 6 HOURS AS NEEDED FOR WHEEZE OR SHORTNESS OF BREATH  . ALPRAZolam (XANAX) 1 MG tablet Take 1 tablet (1 mg total) by mouth 3 (three) times daily.  [name] Kitchen amLODipine (NORVASC) 10 MG tablet TAKE 1 TABLET BY MOUTH DAILY.  [name] Kitchen aspirin 325 MG tablet Take 325 mg by mouth daily.  [name] Kitchen atorvastatin (LIPITOR) 40 MG tablet TAKE 1 TABLET EVERY DAY AT 6PM  . Blood Glucose Monitoring Suppl (ACCU-CHEK GUIDE) w/Device KIT Test Bs QID Dx E11.69  . clopidogrel (PLAVIX) 75 MG tablet Take 1 tablet (75 mg total) by mouth daily with breakfast.  . dapagliflozin propanediol  (FARXIGA) 10 MG TABS tablet Take 1 tablet (10 mg total) by mouth daily. This will replace Invokana  . ezetimibe (ZETIA) 10 MG tablet Take 1 tablet (10 mg total) by mouth daily. For cholesterol  . fluticasone furoate-vilanterol (BREO ELLIPTA) 100-25 MCG/INH AEPB Inhale 1 puff into the lungs daily.  . furosemide (LASIX) 20 MG tablet Take 1 tablet (20 mg total) by mouth daily.  [name] Kitchen glipiZIDE (GLUCOTROL) 5 MG tablet Take 1 tablet (5 mg total) by mouth daily before breakfast.  . glucose blood (ACCU-CHEK GUIDE) test strip Test BS up to 4 times daily Dx E11.69  . loratadine (CLARITIN) 10 MG tablet Take 1 tablet (10 mg total) by mouth daily.  . Semaglutide,0.25 or 0.5MG/DOS, (OZEMPIC, 0.25 OR 0.5 MG/DOSE,) 2 MG/1.5ML SOPN Inject 0.5 mg into the skin once a week.  . solifenacin (VESICARE) 10 MG tablet Take 1 tablet (10 mg total) by mouth daily.  [name] Kitchen triamcinolone (KENALOG) 0.1 % Apply 1 application topically 3 (three) times daily.   No facility-administered encounter medications on file as of 12/08/2020.    Review of Systems  Constitutional: Negative for chills and fever.  HENT: Positive for congestion, ear pain, postnasal drip, rhinorrhea, sinus pressure and sneezing. Negative for ear discharge and sore throat.   Eyes: Negative for visual disturbance.  Respiratory: Positive for cough. Negative for chest tightness and shortness of breath.   Cardiovascular: Negative for chest pain and leg swelling.  Genitourinary: Negative for difficulty  urinating and dysuria.  Musculoskeletal: Negative for back pain, gait problem and myalgias.  Skin: Negative for rash.  Neurological: Negative for light-headedness and headaches.  Psychiatric/Behavioral: Negative for agitation and behavioral problems.  All other systems reviewed and are negative.   Observations/Objective: Patient sounds comfortable and in no acute distress.  Assessment and Plan: Problem List Items Addressed This Visit   None   Visit Diagnoses     Acute maxillary sinusitis, recurrence not specified    -  Primary   Relevant Medications   azithromycin (ZITHROMAX) 250 MG tablet   Other Relevant Orders   Novel Coronavirus, NAA ([facility])      Quarantine and covid test tomorrow and call if worsens.  Already has inhalers. She has been vaccinated but does want to still get tested. Follow up plan: Return if symptoms worsen or fail to improve.     I discussed the assessment and treatment plan with the patient. The patient was provided an opportunity to ask questions and all were answered. The patient agreed with the plan and demonstrated an understanding of the instructions.   The patient was advised to call back or seek an in-person evaluation if the symptoms worsen or if the condition fails to improve as anticipated.  The above assessment and management plan was discussed with the patient. The patient verbalized understanding of and has agreed to the management plan. Patient is aware to call the clinic if symptoms persist or worsen. Patient is aware when to return to the clinic for a follow-up visit. Patient educated on when it is appropriate to go to the emergency department.    I provided 11 minutes of non-face-to-face time during this encounter.    Worthy Rancher, MD

## 2020-12-09 ENCOUNTER — Telehealth: Payer: Self-pay

## 2020-12-09 DIAGNOSIS — J0100 Acute maxillary sinusitis, unspecified: Secondary | ICD-10-CM | POA: Diagnosis not present

## 2020-12-09 NOTE — Addendum Note (Signed)
Addended by: Liliane Bade on: 12/09/2020 09:58 AM   Modules accepted: Orders

## 2020-12-09 NOTE — Telephone Encounter (Signed)
  Prescription Request  12/09/2020  What is the name of the medication or equipment? Pt had phone visit yesterday with dettinger. She didn't have cough yesterday but now she does. She does have a terrible cough  Have you contacted your pharmacy to request a refill? (if applicable) no  Which pharmacy would you [facility] this sent to? [facility]   Patient notified that their request is being sent to the clinical staff for review and that they should receive a response within 2 business days.

## 2020-12-10 DIAGNOSIS — R0981 Nasal congestion: Secondary | ICD-10-CM | POA: Diagnosis not present

## 2020-12-10 DIAGNOSIS — H9209 Otalgia, unspecified ear: Secondary | ICD-10-CM | POA: Diagnosis not present

## 2020-12-10 DIAGNOSIS — R059 Cough, unspecified: Secondary | ICD-10-CM | POA: Diagnosis not present

## 2020-12-10 DIAGNOSIS — J329 Chronic sinusitis, unspecified: Secondary | ICD-10-CM | POA: Diagnosis not present

## 2020-12-10 DIAGNOSIS — J029 Acute pharyngitis, unspecified: Secondary | ICD-10-CM | POA: Diagnosis not present

## 2020-12-12 LAB — NOVEL CORONAVIRUS, NAA: SARS-CoV-2, NAA: DETECTED — AB

## 2020-12-12 MED ORDER — BENZONATATE 100 MG PO CAPS: 100.0000 mg | ORAL_CAPSULE | Freq: Two times a day (BID) | ORAL | 0 refills | Status: DC | PRN

## 2020-12-12 NOTE — Telephone Encounter (Signed)
I sent Tessalon Perles for the patient 

## 2020-12-12 NOTE — Telephone Encounter (Signed)
Patient aware of results.

## 2021-01-03 DIAGNOSIS — E1142 Type 2 diabetes mellitus with diabetic polyneuropathy: Secondary | ICD-10-CM | POA: Diagnosis not present

## 2021-01-03 DIAGNOSIS — I6381 Other cerebral infarction due to occlusion or stenosis of small artery: Secondary | ICD-10-CM | POA: Diagnosis not present

## 2021-01-03 DIAGNOSIS — I951 Orthostatic hypotension: Secondary | ICD-10-CM | POA: Diagnosis not present

## 2021-01-03 DIAGNOSIS — R079 Chest pain, unspecified: Secondary | ICD-10-CM | POA: Diagnosis not present

## 2021-01-05 ENCOUNTER — Ambulatory Visit: Payer: Medicare HMO | Admitting: Licensed Clinical Social Worker

## 2021-01-05 DIAGNOSIS — I10 Essential (primary) hypertension: Secondary | ICD-10-CM

## 2021-01-05 DIAGNOSIS — E785 Hyperlipidemia, unspecified: Secondary | ICD-10-CM

## 2021-01-05 DIAGNOSIS — N1831 Chronic kidney disease, stage 3a: Secondary | ICD-10-CM

## 2021-01-05 DIAGNOSIS — F411 Generalized anxiety disorder: Secondary | ICD-10-CM

## 2021-01-05 DIAGNOSIS — I5032 Chronic diastolic (congestive) heart failure: Secondary | ICD-10-CM

## 2021-01-05 DIAGNOSIS — E1122 Type 2 diabetes mellitus with diabetic chronic kidney disease: Secondary | ICD-10-CM

## 2021-01-05 NOTE — Chronic Care Management (AMB) (Signed)
Chronic Care Management    Clinical Social Work Follow Up Note  01/05/2021 Name: Yvonne [name] MRN: [id number] DOB: 28-Aug-1951  Yvonne [name] is a 70 y.o. year old female who is a primary care patient of [name], [name], MD. The CCM team was consulted for assistance with [employer] .   Review of patient status, including review of consultants reports, other relevant assessments, and collaboration with appropriate care team members and the patient's provider was performed as part of comprehensive patient evaluation and provision of chronic care management services.    SDOH (Social Determinants of Health) assessments performed: No; risk for stress; risk for depression; risk for tobacco use; risk for physical inactivity  [place] Office Visit from 11/29/2020 in [place]  PHQ-9 Total Score 10      GAD 7 : Generalized Anxiety Score 11/02/2020 08/02/2020 05/02/2020 12/02/2019  Nervous, Anxious, on Edge 1 0 1 2  Control/stop worrying 2 3 1 3   Worry too much - different things 2 3 1 3   Trouble relaxing 2 3 3 3   Restless 1 2 1 3   Easily annoyed or irritable 2 0 1 1  Afraid - awful might happen 1 1 0 0  Total GAD 7 Score 11 12 8 15   Anxiety Difficulty - - Somewhat difficult Somewhat difficult    Outpatient Encounter Medications as of 01/05/2021  Medication Sig  . Accu-Chek Softclix Lancets lancets Test BS up to 4 times daily Dx E11.69  . albuterol (VENTOLIN HFA) 108 (90 Base) MCG/ACT inhaler TAKE 2 PUFFS BY MOUTH EVERY 6 HOURS AS NEEDED FOR WHEEZE OR SHORTNESS OF BREATH  . ALPRAZolam (XANAX) 1 MG tablet Take 1 tablet (1 mg total) by mouth 3 (three) times daily.  [name] Kitchen amLODipine (NORVASC) 10 MG tablet TAKE 1 TABLET BY MOUTH DAILY.  [name] Kitchen aspirin 325 MG tablet Take 325 mg by mouth daily.  [name] Kitchen atorvastatin (LIPITOR) 40 MG tablet TAKE 1 TABLET EVERY DAY AT 6PM  . azithromycin (ZITHROMAX) 250 MG tablet Take 2 the first day and then one each day after.  . benzonatate  (TESSALON) 100 MG capsule Take 1 capsule (100 mg total) by mouth 2 (two) times daily as needed for cough.  . Blood Glucose Monitoring Suppl (ACCU-CHEK GUIDE) w/Device KIT Test Bs QID Dx E11.69  . clopidogrel (PLAVIX) 75 MG tablet Take 1 tablet (75 mg total) by mouth daily with breakfast.  . dapagliflozin propanediol (FARXIGA) 10 MG TABS tablet Take 1 tablet (10 mg total) by mouth daily. This will replace Invokana  . ezetimibe (ZETIA) 10 MG tablet Take 1 tablet (10 mg total) by mouth daily. For cholesterol  . fluticasone furoate-vilanterol (BREO ELLIPTA) 100-25 MCG/INH AEPB Inhale 1 puff into the lungs daily.  . furosemide (LASIX) 20 MG tablet Take 1 tablet (20 mg total) by mouth daily.  [name] Kitchen glipiZIDE (GLUCOTROL) 5 MG tablet Take 1 tablet (5 mg total) by mouth daily before breakfast.  . glucose blood (ACCU-CHEK GUIDE) test strip Test BS up to 4 times daily Dx E11.69  . loratadine (CLARITIN) 10 MG tablet Take 1 tablet (10 mg total) by mouth daily.  . Semaglutide,0.25 or 0.5MG /DOS, (OZEMPIC, 0.25 OR 0.5 MG/DOSE,) 2 MG/1.5ML SOPN Inject 0.5 mg into the skin once a week.  . solifenacin (VESICARE) 10 MG tablet Take 1 tablet (10 mg total) by mouth daily.  [name] Kitchen triamcinolone (KENALOG) 0.1 % Apply 1 application topically 3 (three) times daily.   No facility-administered encounter medications on file as of 01/05/2021.  [name] called client home number and cell phone number today but [name] was not able to speak via phone today with client. [name] did leave phone message for client requesting that she please call [name] at [phone]  Follow Up Plan: [name] to call client in next4weeks to talk with her about relaxation techniques use of client and social work needs of client at that time  [name].Pearce Littlefield MSW, [name] Licensed Clinical Social Worker [facility] Care Management [phone]

## 2021-01-05 NOTE — Patient Instructions (Addendum)
Licensed Clinical Social Worker Visit Information  Materials Provided: No  01/05/2021  Name: Yvonne [name]            MRN: [id number]       DOB: 06-24-1951  Yvonne [name] is a 70 y.o. year old female who is a primary care patient of [name], [name], MD. The CCM team was consulted for assistance with [employer] .   Review of patient status, including review of consultants reports, other relevant assessments, and collaboration with appropriate care team members and the patient's provider was performed as part of comprehensive patient evaluation and provision of chronic care management services.    SDOH (Social Determinants of Health) assessments performed: No; risk for stress; risk for depression; risk for tobacco use; risk for physical inactivity  [name] called client home number and cell phone number today but [name] was not able to speak via phone today with client. [name] did leave phone message for client requesting that she please call [name] at [phone]  Follow Up Plan:[name] to call client in next4weeks to talk with her about relaxation techniques use of client and social work needs of client at that time  [name] was not able to speak via phone today with client; thus, client was not able to verbalize understanding of instructions provided today and was not able to accept or decline a print copy of patient instruction materials.   Yvonne [name].Yvonne [name] MSW, [name] Licensed Clinical Social Worker [facility] Care Management [phone]

## 2021-01-20 ENCOUNTER — Ambulatory Visit: Payer: Medicare HMO | Admitting: Internal Medicine

## 2021-01-20 ENCOUNTER — Encounter: Payer: Self-pay | Admitting: Radiology

## 2021-01-20 ENCOUNTER — Encounter: Payer: Self-pay | Admitting: Internal Medicine

## 2021-01-20 ENCOUNTER — Other Ambulatory Visit: Payer: Self-pay

## 2021-01-20 ENCOUNTER — Ambulatory Visit (INDEPENDENT_AMBULATORY_CARE_PROVIDER_SITE_OTHER): Payer: Medicare HMO

## 2021-01-20 VITALS — BP 128/72 | HR 69 | Ht 62.0 in | Wt 180.8 lb

## 2021-01-20 DIAGNOSIS — E1169 Type 2 diabetes mellitus with other specified complication: Secondary | ICD-10-CM | POA: Diagnosis not present

## 2021-01-20 DIAGNOSIS — R002 Palpitations: Secondary | ICD-10-CM

## 2021-01-20 DIAGNOSIS — R072 Precordial pain: Secondary | ICD-10-CM

## 2021-01-20 DIAGNOSIS — I700 Atherosclerosis of aorta: Secondary | ICD-10-CM | POA: Diagnosis not present

## 2021-01-20 DIAGNOSIS — I951 Orthostatic hypotension: Secondary | ICD-10-CM | POA: Diagnosis not present

## 2021-01-20 DIAGNOSIS — E756 Lipid storage disorder, unspecified: Secondary | ICD-10-CM | POA: Diagnosis not present

## 2021-01-20 DIAGNOSIS — H903 Sensorineural hearing loss, bilateral: Secondary | ICD-10-CM | POA: Diagnosis not present

## 2021-01-20 DIAGNOSIS — K219 Gastro-esophageal reflux disease without esophagitis: Secondary | ICD-10-CM | POA: Diagnosis not present

## 2021-01-20 DIAGNOSIS — R053 Chronic cough: Secondary | ICD-10-CM | POA: Diagnosis not present

## 2021-01-20 DIAGNOSIS — H7203 Central perforation of tympanic membrane, bilateral: Secondary | ICD-10-CM | POA: Diagnosis not present

## 2021-01-20 MED ORDER — METOPROLOL TARTRATE 50 MG PO TABS: ORAL_TABLET | ORAL | 0 refills | Status: DC

## 2021-01-20 NOTE — Progress Notes (Signed)
Enrolled patient for a 14 day Zio XT  monitor to be mailed to patients home  °

## 2021-01-20 NOTE — Patient Instructions (Addendum)
Medication Instructions:  Your physician recommends that you continue on your current medications as directed. Please refer to the Current Medication list given to you today.  *If you need a refill on your cardiac medications before your next appointment, please call your pharmacy*   Lab Work: NONE If you have labs (blood work) drawn today and your tests are completely normal, you will receive your results only by: [name] Kitchen MyChart Message (if you have MyChart) OR . A paper copy in the mail If you have any lab test that is abnormal or we need to change your treatment, we will call you to review the results.   Testing/Procedures: Your physician has requested that you have cardiac CT. Cardiac computed tomography (CT) is a painless test that uses an x-ray machine to take clear, detailed pictures of your heart.   Your physician has requested that you wear a 14 day Heart Monitor.   Follow-Up: At [facility], you and your health needs are our priority.  As part of our continuing mission to provide you with exceptional heart care, we have created designated Provider Care Teams.  These Care Teams include your primary Cardiologist (physician) and Advanced Practice Providers (APPs -  Physician Assistants and Nurse Practitioners) who all work together to provide you with the care you need, when you need it.  We recommend signing up for the patient portal called "MyChart".  Sign up information is provided on this After Visit Summary.  MyChart is used to connect with patients for Virtual Visits (Telemedicine).  Patients are able to view lab/test results, encounter notes, upcoming appointments, etc.  Non-urgent messages can be sent to your provider as well.   To learn more about what you can do with MyChart, go to [email or web address].    Your next appointment:   3 month(s)  The format for your next appointment:   In Person  Provider:   You may see Gasper Sells, MD or one of the following Advanced  Practice Providers on your designated Care Team:    Melina Copa, PA-C  Ermalinda Barrios, PA-C   Other Instructions Your cardiac CT will be scheduled at one of the below locations:   [facility] [address] [place], [place] [address] [phone]    [facility], please arrive at the [facility] main entrance (entrance A) of [facility] 30 minutes prior to test start time. Proceed to the [facility] Radiology Department (first floor) to check-in and test prep.  Please follow these instructions carefully (unless otherwise directed):   On the Night Before the Test: . Be sure to Drink plenty of water. . Do not consume any caffeinated/decaffeinated beverages or chocolate 12 hours prior to your test. . Do not take any antihistamines 12 hours prior to your test. . If the patient has contrast allergy:   On the Day of the Test: . Drink plenty of water until 1 hour prior to the test. . Do not eat any food 4 hours prior to the test. . You may take your regular medications prior to the test.  . Take metoprolol (Lopressor) 50mg  two hours prior to test. . HOLD Furosemide the morning of the test. . FEMALES- please wear underwire-free bra if available       After the Test: . Drink plenty of water. . After receiving IV contrast, you may experience a mild flushed feeling. This is normal. . On occasion, you may experience a mild rash up to 24 hours after the test.  This is not dangerous. If this occurs, you can take Benadryl 25 mg and increase your fluid intake. . If you experience trouble breathing, this can be serious. If it is severe call 911 IMMEDIATELY. If it is mild, please call our office. . If you take any of these medications: Glipizide please do not take 48 hours after completing test unless otherwise instructed.   Once we have confirmed authorization from your insurance company, we will call you to set up a date and time for your test. Based on how  quickly your insurance processes prior authorizations requests, please allow up to 4 weeks to be contacted for scheduling your Cardiac CT appointment. Be advised that routine Cardiac CT appointments could be scheduled as many as 8 weeks after your provider has ordered it.  For non-scheduling related questions, please contact the cardiac imaging nurse navigator should you have any questions/concerns: Marchia Bond, Cardiac Imaging Nurse Navigator Gordy Clement, Cardiac Imaging Nurse Navigator  [facility] Direct Office Dial: [phone]   For scheduling needs, including cancellations and rescheduling, please call [place], [phone].       ZIO XT- Long Term Monitor Instructions   Your physician has requested you wear your ZIO patch monitor_14_days.   This is a single patch monitor.  [employer] supplies one patch monitor per enrollment.  Additional stickers are not available.   Please do not apply patch if you will be having a Nuclear Stress Test, Echocardiogram, Cardiac CT, MRI, or Chest Xray during the time frame you would be wearing the monitor. The patch cannot be worn during these tests.  You cannot remove and re-apply the ZIO XT patch monitor.   Your ZIO patch monitor will be sent USPS Priority mail from [facility] directly to your home address. The monitor may also be mailed to a PO BOX if home delivery is not available.   It may take 3-5 days to receive your monitor after you have been enrolled.   Once you have received you monitor, please review enclosed instructions.  Your monitor has already been registered assigning a specific monitor serial # to you.   Applying the monitor   Shave hair from upper left chest.   Hold abrader disc by orange tab.  Rub abrader in 40 strokes over left upper chest as indicated in your monitor instructions.   Clean area with 4 enclosed alcohol pads .  Use all pads to assure are is cleaned thoroughly.  Let dry.    Apply patch as indicated in monitor instructions.  Patch will be place under collarbone on left side of chest with arrow pointing upward.   Rub patch adhesive wings for 2 minutes.Remove white label marked "1".  Remove white label marked "2".  Rub patch adhesive wings for 2 additional minutes.   While looking in a mirror, press and release button in center of patch.  A small green light will flash 3-4 times .  This will be your only indicator the monitor has been turned on.     Do not shower for the first 24 hours.  You may shower after the first 24 hours.   Press button if you feel a symptom. You will hear a small click.  Record Date, Time and Symptom in the Patient Log Book.   When you are ready to remove patch, follow instructions on last 2 pages of Patient Log Book.  Stick patch monitor onto last page of Patient Log Book.   Place Patient Log Book  in [place] box.  Use locking tab on box and tape box closed securely.  The [employer] has [employer] on it.  Please place in mailbox as soon as possible.  Your physician should have your test results approximately 7 days after the monitor has been mailed back to [facility].   Call [place] at [phone] if you have questions regarding your ZIO XT patch monitor.  Call them immediately if you see an orange light blinking on your monitor.   If your monitor falls off in less than 4 days contact our Monitor department at [phone].  If your monitor becomes loose or falls off after 4 days call [employer] at [phone] for suggestions on securing your monitor.

## 2021-01-20 NOTE — Progress Notes (Signed)
Cardiology Office Note:    Date:  01/20/2021   ID:  Yvonne [name], DOB 02/19/1951, MRN [id number]  PCP:  [name], [place]  Cardiologist:  No primary care provider on file.  Advanced Practice Provider:  No care team member to display Electrophysiologist:  None       CC: Brought by neurologist Consulted for the evaluation of chest pain at the behest of [name], MD  History of Present Illness:    Yvonne [name] is a 70 y.o. female with a hx of HTN with DM orthostatic hypotension, HFpEF NOS, Chronic Migraine, GERD, HLD with DM and Aortic atherosclerosis, stoke with prior lacunar infarcts 01/03/21, CKD IIIa who presents for evaluation 01/20/21.  Patient notes that she is feeling tired.  Patient has occasional chest pressure associate with nausea and and pain in her left arm.   Discomfort occurs with at no certain times, worsens without activity or exercise, but increases with stress, and improves with time and rest.  Patient exertion notable for running round with her grandkids with and feels no symptoms.  Notes DOE when climb steps.  Notes chronic cough.  Never smoker.  Notes orthopnea; sleeps on three pillows.  Notes dizziness with bending over;  no weight gain, notes leg swelling .  No syncope or near syncope. Notes some funny heart flutters and palpitations.  These occur spontaneously.  Feels them most in her legs and come on up.      Ambulatory BP: is all over the place; it changes with position.  Past Medical History:  Diagnosis Date  . Anxiety   . Arthritis    left hand  . Asthma    daily and prn inhalers  . Chronic cough   . Chronic otitis media 12/2017  . Full dentures   . GERD (gastroesophageal reflux disease)   . History of stroke 09/2017   weakness right hand, numbness right side face  . Hyperlipidemia   . Hypertension    states under control with meds., has been on med. x a long time, per pt.  . Non-insulin dependent type 2  diabetes mellitus ([place])   . Overactive bladder   . Recurrent acute suppurative otitis media without spontaneous rupture of left tympanic membrane 02/19/2018  . Transient cerebral ischemia     Past Surgical History:  Procedure Laterality Date  . ABDOMINAL HYSTERECTOMY     complete  . CATARACT EXTRACTION W/ INTRAOCULAR LENS IMPLANT Right   . CHOLECYSTECTOMY    . COLONOSCOPY N/A 08/17/2015   Procedure: COLONOSCOPY;  Surgeon: Rogene Houston, MD;  [facility];  Service: Endoscopy;  Laterality: N/A;  200 - moved to 7:30 - Ann notified pt  . GAS INSERTION Right    x 2 - eye  . LACRIMAL DUCT EXPLORATION Bilateral    removal of tear ducts  . MYRINGOTOMY WITH TUBE PLACEMENT Bilateral 01/28/2018   Procedure: BILATERAL MYRINGOTOMY WITH TUBE PLACEMENT;  Surgeon: Leta Baptist, MD;  Location: [facility];  Service: ENT;  Laterality: Bilateral;  . VITRECTOMY Left 09/14/2015    Current Medications: Current Meds  Medication Sig  . Accu-Chek Softclix Lancets lancets Test BS up to 4 times daily Dx E11.69  . albuterol (VENTOLIN HFA) 108 (90 Base) MCG/ACT inhaler TAKE 2 PUFFS BY MOUTH EVERY 6 HOURS AS NEEDED FOR WHEEZE OR SHORTNESS OF BREATH  . ALPRAZolam (XANAX) 1 MG tablet Take 1 tablet (1 mg total) by mouth 3 (three)  times daily.  [name] Kitchen amLODipine (NORVASC) 10 MG tablet TAKE 1 TABLET BY MOUTH DAILY.  [name] Kitchen aspirin 325 MG tablet Take 325 mg by mouth daily.  [name] Kitchen atorvastatin (LIPITOR) 40 MG tablet TAKE 1 TABLET EVERY DAY AT 6PM  . Blood Glucose Monitoring Suppl (ACCU-CHEK GUIDE) w/Device KIT Test Bs QID Dx E11.69  . canagliflozin (INVOKANA) 300 MG TABS tablet Take 1 tablet by mouth daily.  . clopidogrel (PLAVIX) 75 MG tablet Take 1 tablet (75 mg total) by mouth daily with breakfast.  . ezetimibe (ZETIA) 10 MG tablet Take 1 tablet (10 mg total) by mouth daily. For cholesterol  . fluticasone furoate-vilanterol (BREO ELLIPTA) 100-25 MCG/INH AEPB Inhale 1 puff into the lungs daily.  . furosemide  (LASIX) 20 MG tablet Take 1 tablet (20 mg total) by mouth daily.  [name] Kitchen glipiZIDE (GLUCOTROL) 5 MG tablet Take 1 tablet (5 mg total) by mouth daily before breakfast.  . glucose blood (ACCU-CHEK GUIDE) test strip Test BS up to 4 times daily Dx E11.69  . loratadine (CLARITIN) 10 MG tablet Take 1 tablet (10 mg total) by mouth daily.  . metoprolol tartrate (LOPRESSOR) 50 MG tablet Take 2 hours prior to Cardiac CT  . Semaglutide,0.25 or 0.5MG/DOS, (OZEMPIC, 0.25 OR 0.5 MG/DOSE,) 2 MG/1.5ML SOPN Inject 0.5 mg into the skin once a week.  . solifenacin (VESICARE) 10 MG tablet Take 1 tablet (10 mg total) by mouth daily.  [name] Kitchen triamcinolone (KENALOG) 0.1 % Apply 1 application topically 3 (three) times daily.     Allergies:   Penicillins, Gabapentin, Lisinopril, and Soap   Social History   Socioeconomic History  . Marital status: Married    Spouse name: Not on file  . Number of children: 3  . Years of education: Not on file  . Highest education level: GED or equivalent  Occupational History  . Occupation: retired    Comment: Customer service manager and restaurants  Tobacco Use  . Smoking status: Never Smoker  . Smokeless tobacco: Never Used  Vaping Use  . Vaping Use: Never used  Substance and Sexual Activity  . Alcohol use: No  . Drug use: No  . Sexual activity: Yes  Other Topics Concern  . Not on file  Social History Narrative   Lives at home home with husband with son, daughter in law and 3 children.  [name], his girlfriend and her child also moved in 10/2018      Disabled.   Social Determinants of Health   Financial Resource Strain: Not on file  Food Insecurity: Not on file  Transportation Needs: No Transportation Needs  . Lack of Transportation (Medical): No  . Lack of Transportation (Non-Medical): No  Physical Activity: Inactive  . Days of Exercise per Week: 0 days  . Minutes of Exercise per Session: 0 min  Stress: Not on file  Social Connections: Not on file    Social :  Had  three grandkids: 2 boys, one girl  Family History: The patient's family history includes Arthritis in her brother and mother; Arthritis-Osteo in her son; Asthma in her father; CVA in her mother; Cancer in her brother; Diabetes in her father and mother; Heart disease in her father; Hepatitis C in her sister; Peripheral Artery Disease in her daughter; Stroke in her mother.  ROS:   Please see the history of present illness.     All other systems reviewed and are negative.  EKGs/Labs/Other Studies Reviewed:    The following studies were reviewed today:  EKG:   01/20/21:  SR 69 anterior infarct, inferior infarct pattern 06/11/2019: SR 69 Nonspecific TWI  Recent Labs: 11/02/2020: ALT 18; BUN 15; Creatinine, Ser 1.01; Hemoglobin 13.2; Platelets 224; Potassium 4.0; Sodium 139  Recent Lipid Panel    Component Value Date/Time   CHOL 165 11/02/2020 1010   TRIG 114 11/02/2020 1010   HDL 44 11/02/2020 1010   CHOLHDL 3.8 11/02/2020 1010   CHOLHDL 5.7 06/11/2019 0622   VLDL 39 06/11/2019 0622   LDLCALC 100 (H) 11/02/2020 1010   Non-Cardiac CT: Date: 05/25/2016 Personally Reviewed Results: Aortic Atherosclerosis without coronary calcifications  Transthoracic Echocardiogram: Date: 06/11/2019 Results: 1. The left ventricle has normal systolic function with an ejection  fraction of 60-65%. The cavity size was normal. There is moderately  increased left ventricular wall thickness. Left ventricular diastolic  Doppler parameters are consistent with impaired  relaxation. Elevated mean left atrial pressure.  2. The right ventricle has normal systolic function. The cavity was  normal. There is no increase in right ventricular wall thickness.  3. Left atrial size was mildly dilated.  4. No evidence of mitral valve stenosis.  5. The aortic valve has an indeterminate number of cusps. No stenosis of  the aortic valve.  6. The aortic root is normal in size and structure.  7. Pulmonary  hypertension is indeterminate, inadequate TR jet.   NM Stress Testing : Date: 08/13/2016 Results: Report only from [facility]:  Normal function with perfusion defect  Risk Assessment/Calculations:   N/A   Physical Exam:    VS:  BP 128/72   Pulse 69   Ht 5' 2" (1.575 m)   Wt 180 lb 12.8 oz (82 kg)   SpO2 99%   BMI 33.07 kg/m     Orthostatic Vitals: Supine:  BP 136/80  HR 66 Sitting:  BP 128/72  HR 60 Standing:  BP 130/74  HR 63 Prolonged Stand:  BP 110/68  HR 65 Asymptomatic  Wt Readings from Last 3 Encounters:  01/20/21 180 lb 12.8 oz (82 kg)  11/29/20 180 lb 3.2 oz (81.7 kg)  11/02/20 183 lb 2 oz (83.1 kg)    GEN:  Well nourished, well developed in no acute distress HEENT: Normal NECK: No JVD; No carotid bruits LYMPHATICS: No lymphadenopathy CARDIAC: RRR, no murmurs, rubs, gallops RESPIRATORY:  Clear to auscultation without rales, wheezing or rhonchi  ABDOMEN: Soft, non-tender, non-distended MUSCULOSKELETAL:  Non pitting edema; No deformity  SKIN: Warm and dry NEUROLOGIC:  Alert and oriented x 3 PSYCHIATRIC:  Normal affect   ASSESSMENT:    1. Precordial pain   2. Palpitations   3. Diabetic lipidosis ([place])   4. Aortic atherosclerosis ([place])   5. Orthostatic hypotension    PLAN:    In order of problems listed above:  Precordial chest pain Prior stroke HLD and Aortic Atheroscleris - The patient presents with possibly cardiac chest pain - EKG shows q waves - continue plavix;  - Presently no ASA 325 indication, current statin and zetia - Would recommend CCTA with possible FFR as needed to exclude obstructive CAD and to assess for non-obstructive CAD requiring secondary prevention  Palpitations with prior stroke - will obtain 14-day non live heart monitor (ZioPatch)  Orthostatic Hypotension - asymptomatic presently - stress the importance of salt and water intake - on NO TCAs, alpha agonists, beta antagonists suitable for discontinuation  - gave education on  slow rise, Valsalva maneuver exacerbation, temperature change - discussed muscle contraction and leg crossing - discussed elevation to 30-45 degrees when sleeping and  discussed supine HTN concerns - exercise deconditioning exacerbates this issue, optimally benefits from bike/row or swimming - offered compression stockings and abdominal binders  Three months follow up unless new symptoms or abnormal test results warranting change in plan  Would be reasonable for  Video Visit Follow up Would be reasonable for  APP Follow up        Medication Adjustments/Labs and Tests Ordered: Current medicines are reviewed at length with the patient today.  Concerns regarding medicines are outlined above.  Orders Placed This Encounter  Procedures  . CT CORONARY MORPH W/CTA COR W/SCORE W/CA W/CM &/OR WO/CM  . CT CORONARY FRACTIONAL FLOW RESERVE DATA PREP  . CT CORONARY FRACTIONAL FLOW RESERVE FLUID ANALYSIS  . Basic metabolic panel  . LONG TERM MONITOR (3-14 DAYS)  . EKG 12-Lead   Meds ordered this encounter  Medications  . metoprolol tartrate (LOPRESSOR) 50 MG tablet    Sig: Take 2 hours prior to Cardiac CT    Dispense:  1 tablet    Refill:  0    Patient Instructions  Medication Instructions:  Your physician recommends that you continue on your current medications as directed. Please refer to the Current Medication list given to you today.  *If you need a refill on your cardiac medications before your next appointment, please call your pharmacy*   Lab Work: NONE If you have labs (blood work) drawn today and your tests are completely normal, you will receive your results only by: [name] Kitchen MyChart Message (if you have MyChart) OR . A paper copy in the mail If you have any lab test that is abnormal or we need to change your treatment, we will call you to review the results.   Testing/Procedures: Your physician has requested that you have cardiac CT. Cardiac computed tomography (CT) is a  painless test that uses an x-ray machine to take clear, detailed pictures of your heart.   Your physician has requested that you wear a 14 day Heart Monitor.   Follow-Up: At [facility], you and your health needs are our priority.  As part of our continuing mission to provide you with exceptional heart care, we have created designated Provider Care Teams.  These Care Teams include your primary Cardiologist (physician) and Advanced Practice Providers (APPs -  Physician Assistants and Nurse Practitioners) who all work together to provide you with the care you need, when you need it.  We recommend signing up for the patient portal called "MyChart".  Sign up information is provided on this After Visit Summary.  MyChart is used to connect with patients for Virtual Visits (Telemedicine).  Patients are able to view lab/test results, encounter notes, upcoming appointments, etc.  Non-urgent messages can be sent to your provider as well.   To learn more about what you can do with MyChart, go to [email or web address].    Your next appointment:   3 month(s)  The format for your next appointment:   In Person  Provider:   You may see Gasper Sells, MD or one of the following Advanced Practice Providers on your designated Care Team:    Melina Copa, PA-C  Ermalinda Barrios, PA-C   Other Instructions Your cardiac CT will be scheduled at one of the below locations:   [facility] [address] [place], [place] [address] [phone]    [facility], please arrive at the [facility] main entrance (entrance A) of [facility] 30 minutes prior to test start time. Proceed to the  [facility] Radiology Department (first floor) to check-in and test prep.  Please follow these instructions carefully (unless otherwise directed):   On the Night Before the Test: . Be sure to Drink plenty of water. . Do not consume any caffeinated/decaffeinated beverages or chocolate 12 hours  prior to your test. . Do not take any antihistamines 12 hours prior to your test. . If the patient has contrast allergy:   On the Day of the Test: . Drink plenty of water until 1 hour prior to the test. . Do not eat any food 4 hours prior to the test. . You may take your regular medications prior to the test.  . Take metoprolol (Lopressor) 48m two hours prior to test. . HOLD Furosemide the morning of the test. . FEMALES- please wear underwire-free bra if available       After the Test: . Drink plenty of water. . After receiving IV contrast, you may experience a mild flushed feeling. This is normal. . On occasion, you may experience a mild rash up to 24 hours after the test. This is not dangerous. If this occurs, you can take Benadryl 25 mg and increase your fluid intake. . If you experience trouble breathing, this can be serious. If it is severe call 911 IMMEDIATELY. If it is mild, please call our office. . If you take any of these medications: Glipizide please do not take 48 hours after completing test unless otherwise instructed.   Once we have confirmed authorization from your insurance company, we will call you to set up a date and time for your test. Based on how quickly your insurance processes prior authorizations requests, please allow up to 4 weeks to be contacted for scheduling your Cardiac CT appointment. Be advised that routine Cardiac CT appointments could be scheduled as many as 8 weeks after your provider has ordered it.  For non-scheduling related questions, please contact the cardiac imaging nurse navigator should you have any questions/concerns: SMarchia Bond Cardiac Imaging Nurse Navigator MGordy Clement Cardiac Imaging Nurse Navigator [facility] Direct Office Dial: [phone]  For scheduling needs, including cancellations and rescheduling, please call [place] [phone]       ZIO XT- Long Term Monitor Instructions   Your  physician has requested you wear your ZIO patch monitor_14_days.   This is a single patch monitor.  [employer] supplies one patch monitor per enrollment.  Additional stickers are not available.   Please do not apply patch if you will be having a Nuclear Stress Test, Echocardiogram, Cardiac CT, MRI, or Chest Xray during the time frame you would be wearing the monitor. The patch cannot be worn during these tests.  You cannot remove and re-apply the ZIO XT patch monitor.   Your ZIO patch monitor will be sent USPS Priority mail from [facility] to your home address. The monitor may also be mailed to a PO BOX if home delivery is not available.   It may take 3-5 days to receive your monitor after you have been enrolled.   Once you have received you monitor, please review enclosed instructions.  Your monitor has already been registered assigning a specific monitor serial # to you.   Applying the monitor   Shave hair from upper left chest.   Hold abrader disc by orange tab.  Rub abrader in 40 strokes over left upper chest as indicated in your monitor instructions.   Clean area with 4 enclosed alcohol pads .  Use  all pads to assure are is cleaned thoroughly.  Let dry.   Apply patch as indicated in monitor instructions.  Patch will be place under collarbone on left side of chest with arrow pointing upward.   Rub patch adhesive wings for 2 minutes.Remove white label marked "1".  Remove white label marked "2".  Rub patch adhesive wings for 2 additional minutes.   While looking in a mirror, press and release button in center of patch.  A small green light will flash 3-4 times .  This will be your only indicator the monitor has been turned on.     Do not shower for the first 24 hours.  You may shower after the first 24 hours.   Press button if you feel a symptom. You will hear a small click.  Record Date, Time and Symptom in the Patient Log Book.   When you are ready to remove patch,  follow instructions on last 2 pages of Patient Log Book.  Stick patch monitor onto last page of Patient Log Book.   Place Patient Log Book in [place] box.  Use locking tab on box and tape box closed securely.  The [employer] and [employer] has [employer] on it.  Please place in mailbox as soon as possible.  Your physician should have your test results approximately 7 days after the monitor has been mailed back to [facility].   Call [place] at [phone] if you have questions regarding your ZIO XT patch monitor.  Call them immediately if you see an orange light blinking on your monitor.   If your monitor falls off in less than 4 days contact our Monitor department at [phone].  If your monitor becomes loose or falls off after 4 days call [employer] at [phone] for suggestions on securing your monitor.          Signed, Werner Lean, MD  01/20/2021 12:40 PM    [place]

## 2021-01-24 DIAGNOSIS — R002 Palpitations: Secondary | ICD-10-CM

## 2021-01-25 ENCOUNTER — Other Ambulatory Visit: Payer: Self-pay | Admitting: Family Medicine

## 2021-01-25 DIAGNOSIS — F1320 Sedative, hypnotic or anxiolytic dependence, uncomplicated: Secondary | ICD-10-CM

## 2021-01-25 DIAGNOSIS — F411 Generalized anxiety disorder: Secondary | ICD-10-CM

## 2021-02-01 ENCOUNTER — Ambulatory Visit (INDEPENDENT_AMBULATORY_CARE_PROVIDER_SITE_OTHER): Payer: Medicare HMO | Admitting: Family Medicine

## 2021-02-01 ENCOUNTER — Encounter: Payer: Self-pay | Admitting: Family Medicine

## 2021-02-01 ENCOUNTER — Other Ambulatory Visit: Payer: Self-pay

## 2021-02-01 VITALS — BP 134/86 | HR 70 | Temp 97.5°F | Ht 62.0 in | Wt 179.8 lb

## 2021-02-01 DIAGNOSIS — E1122 Type 2 diabetes mellitus with diabetic chronic kidney disease: Secondary | ICD-10-CM | POA: Diagnosis not present

## 2021-02-01 DIAGNOSIS — N1831 Chronic kidney disease, stage 3a: Secondary | ICD-10-CM | POA: Diagnosis not present

## 2021-02-01 DIAGNOSIS — E785 Hyperlipidemia, unspecified: Secondary | ICD-10-CM

## 2021-02-01 DIAGNOSIS — F411 Generalized anxiety disorder: Secondary | ICD-10-CM | POA: Diagnosis not present

## 2021-02-01 DIAGNOSIS — I10 Essential (primary) hypertension: Secondary | ICD-10-CM | POA: Diagnosis not present

## 2021-02-01 DIAGNOSIS — Z0289 Encounter for other administrative examinations: Secondary | ICD-10-CM

## 2021-02-01 DIAGNOSIS — F1320 Sedative, hypnotic or anxiolytic dependence, uncomplicated: Secondary | ICD-10-CM | POA: Diagnosis not present

## 2021-02-01 DIAGNOSIS — E119 Type 2 diabetes mellitus without complications: Secondary | ICD-10-CM

## 2021-02-01 DIAGNOSIS — Z79891 Long term (current) use of opiate analgesic: Secondary | ICD-10-CM | POA: Diagnosis not present

## 2021-02-01 LAB — BAYER DCA HB A1C WAIVED: HB A1C (BAYER DCA - WAIVED): 7.8 % — ABNORMAL HIGH (ref <7.0)

## 2021-02-01 MED ORDER — ALPRAZOLAM 1 MG PO TABS: 1.0000 mg | ORAL_TABLET | Freq: Three times a day (TID) | ORAL | 2 refills | Status: DC

## 2021-02-01 MED ORDER — OZEMPIC (0.25 OR 0.5 MG/DOSE) 2 MG/1.5ML ~~LOC~~ SOPN: 0.5000 mg | PEN_INJECTOR | SUBCUTANEOUS | 2 refills | Status: DC

## 2021-02-01 MED ORDER — QUETIAPINE FUMARATE 25 MG PO TABS: 25.0000 mg | ORAL_TABLET | Freq: Every day | ORAL | 1 refills | Status: DC

## 2021-02-01 MED ORDER — GLIPIZIDE 5 MG PO TABS: 5.0000 mg | ORAL_TABLET | Freq: Every day | ORAL | 1 refills | Status: DC

## 2021-02-01 NOTE — Progress Notes (Signed)
 Subjective:  Patient ID: Yvonne [name],  female    DOB: 03/18/1951  Age: 70 y.o.    CC: Medical Management of Chronic Issues   HPI Yvonne [name] presents for  follow-up of hypertension. Patient has no history of headache chest pain or shortness of breath or recent cough. Patient also denies symptoms of TIA such as numbness weakness lateralizing. Patient denies side effects from medication. States taking it regularly.  Patient also  in for follow-up of elevated cholesterol. Doing well without complaints on current medication. Denies side effects  including myalgia and arthralgia and nausea. Also in today for liver function testing. Currently no chest pain, shortness of breath or other cardiovascular related symptoms noted.  Follow-up of diabetes. Patient does check blood sugar at home. Readings are up and down. Off sweets and drinks. Patient denies symptoms such as excessive hunger or urinary frequency, excessive hunger, nausea No significant hypoglycemic spells noted. Medications reviewed. Pt reports taking them regularly. Pt. denies complication/adverse reaction today.   She is doing all of the day-to-day care for her grandkids.  Her son and his wife live with her but they do not pay attention to the kids.  Her daughter-in-law apparently is too depressed to participate.  She is very frustrated about their lack of involvement in their kids life and and caring for the household.  She says her son grew up under her husband who also did nothing including holding a job or helping around the house or helping with the kids.. History Yvonne [name] has a past medical history of Anxiety, Arthritis, Asthma, Chronic cough, Chronic otitis media (12/2017), Full dentures, GERD (gastroesophageal reflux disease), History of stroke (09/2017), Hyperlipidemia, Hypertension, Non-insulin dependent type 2 diabetes mellitus (HCC), Overactive bladder, Recurrent acute suppurative otitis media without spontaneous rupture of  left tympanic membrane (02/19/2018), and Transient cerebral ischemia.   She has a past surgical history that includes Cholecystectomy; Colonoscopy (N/A, 08/17/2015); Abdominal hysterectomy; Lacrimal duct exploration (Bilateral); Cataract extraction w/ intraocular lens implant (Right); Vitrectomy (Left, 09/14/2015); Gas insertion (Right); and Myringotomy with tube placement (Bilateral, 01/28/2018).   Her family history includes Arthritis in her brother and mother; Arthritis-Osteo in her son; Asthma in her father; CVA in her mother; Cancer in her brother; Diabetes in her father and mother; Heart disease in her father; Hepatitis C in her sister; Peripheral Artery Disease in her daughter; Stroke in her mother.She reports that she has never smoked. She has never used smokeless tobacco. She reports that she does not drink alcohol and does not use drugs.  Current Outpatient Medications on File Prior to Visit  Medication Sig Dispense Refill  . Accu-Chek Softclix Lancets lancets Test BS up to 4 times daily Dx E11.69 400 each 3  . albuterol (VENTOLIN HFA) 108 (90 Base) MCG/ACT inhaler TAKE 2 PUFFS BY MOUTH EVERY 6 HOURS AS NEEDED FOR WHEEZE OR SHORTNESS OF BREATH 54 each 0  . amLODipine (NORVASC) 10 MG tablet TAKE 1 TABLET BY MOUTH DAILY. 90 tablet 1  . atorvastatin (LIPITOR) 40 MG tablet TAKE 1 TABLET EVERY DAY AT 6PM 90 tablet 1  . Blood Glucose Monitoring Suppl (ACCU-CHEK GUIDE) w/Device KIT Test Bs QID Dx E11.69 1 kit 0  . canagliflozin (INVOKANA) 300 MG TABS tablet Take 1 tablet by mouth daily.    . clopidogrel (PLAVIX) 75 MG tablet Take 1 tablet (75 mg total) by mouth daily with breakfast. 90 tablet 1  . ezetimibe (ZETIA) 10 MG tablet Take 1 tablet (10 mg total) by mouth   daily. For cholesterol 90 tablet 1  . fluticasone furoate-vilanterol (BREO ELLIPTA) 100-25 MCG/INH AEPB Inhale 1 puff into the lungs daily. 180 each 1  . furosemide (LASIX) 20 MG tablet Take 1 tablet (20 mg total) by mouth daily. 90 tablet  1  . glucose blood (ACCU-CHEK GUIDE) test strip Test BS up to 4 times daily Dx E11.69 400 each 3  . loratadine (CLARITIN) 10 MG tablet Take 1 tablet (10 mg total) by mouth daily. 90 tablet 3  . metoprolol tartrate (LOPRESSOR) 50 MG tablet Take 2 hours prior to Cardiac CT 1 tablet 0  . solifenacin (VESICARE) 10 MG tablet Take 1 tablet (10 mg total) by mouth daily. 90 tablet 1  . aspirin 325 MG tablet Take 325 mg by mouth daily. (Patient not taking: Reported on 02/01/2021)    . triamcinolone (KENALOG) 0.1 % Apply 1 application topically 3 (three) times daily. (Patient not taking: Reported on 02/01/2021) 30 each 0   No current facility-administered medications on file prior to visit.    ROS Review of Systems  Constitutional: Negative.   HENT: Negative.   Eyes: Negative for visual disturbance.  Respiratory: Negative for shortness of breath.   Cardiovascular: Negative for chest pain.  Gastrointestinal: Negative for abdominal pain.  Musculoskeletal: Negative for arthralgias.    Objective:  BP 134/86   Pulse 70   Temp (!) 97.5 F (36.4 C)   Ht 5' 2" (1.575 m)   Wt 179 lb 12.8 oz (81.6 kg)   SpO2 100%   BMI 32.89 kg/m   BP Readings from Last 3 Encounters:  02/01/21 134/86  01/20/21 128/72  11/29/20 124/72    Wt Readings from Last 3 Encounters:  02/01/21 179 lb 12.8 oz (81.6 kg)  01/20/21 180 lb 12.8 oz (82 kg)  11/29/20 180 lb 3.2 oz (81.7 kg)     Physical Exam Constitutional:      General: She is not in acute distress.    Appearance: She is well-developed.  HENT:     Head: Normocephalic and atraumatic.  Eyes:     Conjunctiva/sclera: Conjunctivae normal.     Pupils: Pupils are equal, round, and reactive to light.  Neck:     Thyroid: No thyromegaly.  Cardiovascular:     Rate and Rhythm: Normal rate and regular rhythm.     Heart sounds: Normal heart sounds. No murmur heard.   Pulmonary:     Effort: Pulmonary effort is normal. No respiratory distress.     Breath  sounds: Normal breath sounds. No wheezing or rales.  Abdominal:     General: Bowel sounds are normal. There is no distension.     Palpations: Abdomen is soft.     Tenderness: There is no abdominal tenderness.  Musculoskeletal:        General: Normal range of motion.     Cervical back: Normal range of motion and neck supple.  Lymphadenopathy:     Cervical: No cervical adenopathy.  Skin:    General: Skin is warm and dry.  Neurological:     Mental Status: She is alert and oriented to person, place, and time.  Psychiatric:        Behavior: Behavior normal.        Thought Content: Thought content normal.        Judgment: Judgment normal.     Assessment & Plan:   Yvonne [name] was seen today for medical management of chronic issues.  Diagnoses and all orders for this visit:  Hyperlipidemia with   target LDL less than 70 -     Lipid panel  Essential hypertension -     CMP14+EGFR  Type 2 diabetes mellitus with stage 3a chronic kidney disease, without long-term current use of insulin (HCC) -     Bayer DCA Hb A1c Waived -     CBC with Differential/Platelet -     glipiZIDE (GLUCOTROL) 5 MG tablet; Take 1 tablet (5 mg total) by mouth daily before breakfast.  GAD (generalized anxiety disorder) -     ALPRAZolam (XANAX) 1 MG tablet; Take 1 tablet (1 mg total) by mouth 3 (three) times daily.  Benzodiazepine dependence, continuous (HCC) -     ALPRAZolam (XANAX) 1 MG tablet; Take 1 tablet (1 mg total) by mouth 3 (three) times daily.  Medication management contract signed -     ToxASSURE Select 13 (MW), Urine  Type 2 diabetes mellitus without complication, without long-term current use of insulin (HCC) -     Semaglutide,0.25 or 0.5MG/DOS, (OZEMPIC, 0.25 OR 0.5 MG/DOSE,) 2 MG/1.5ML SOPN; Inject 0.5 mg into the skin once a week. On Wednesday  Other orders -     QUEtiapine (SEROQUEL) 25 MG tablet; Take 1 tablet (25 mg total) by mouth at bedtime.   I have changed Yvonne [name]'s Ozempic (0.25  or 0.5 MG/DOSE). I am also having her start on QUEtiapine. Additionally, I am having her maintain her loratadine, aspirin, Accu-Chek Guide, Accu-Chek Softclix Lancets, Accu-Chek Guide, albuterol, solifenacin, furosemide, Breo Ellipta, ezetimibe, clopidogrel, atorvastatin, amLODipine, triamcinolone, canagliflozin, metoprolol tartrate, ALPRAZolam, and glipiZIDE.  Meds ordered this encounter  Medications  . ALPRAZolam (XANAX) 1 MG tablet    Sig: Take 1 tablet (1 mg total) by mouth 3 (three) times daily.    Dispense:  90 tablet    Refill:  2  . glipiZIDE (GLUCOTROL) 5 MG tablet    Sig: Take 1 tablet (5 mg total) by mouth daily before breakfast.    Dispense:  90 tablet    Refill:  1  . Semaglutide,0.25 or 0.5MG/DOS, (OZEMPIC, 0.25 OR 0.5 MG/DOSE,) 2 MG/1.5ML SOPN    Sig: Inject 0.5 mg into the skin once a week. On Wednesday    Dispense:  1 mL    Refill:  2  . QUEtiapine (SEROQUEL) 25 MG tablet    Sig: Take 1 tablet (25 mg total) by mouth at bedtime.    Dispense:  90 tablet    Refill:  1   We discussed various principles of tough love that will be necessary for her to resolve her situation with her family particularly her son and daughter-in-law.  She is concerned that her grandkids would injure into foster care if her son leaves her home.  I suggested she may want to be the foster parent herself.  However, she needs to seek professional guidance.  I suggested [facility] family[facility] as a good option for her at this time.  Follow-up: No follow-ups on file.  [name], M.D.

## 2021-02-02 LAB — CMP14+EGFR
ALT: 31 IU/L (ref 0–32)
AST: 25 IU/L (ref 0–40)
Albumin/Globulin Ratio: 2.1 (ref 1.2–2.2)
Albumin: 4.2 g/dL (ref 3.7–4.7)
Alkaline Phosphatase: 134 IU/L — ABNORMAL HIGH (ref 44–121)
BUN/Creatinine Ratio: 17 (ref 12–28)
BUN: 19 mg/dL (ref 8–27)
Bilirubin Total: 0.4 mg/dL (ref 0.0–1.2)
CO2: 24 mmol/L (ref 20–29)
Calcium: 9.9 mg/dL (ref 8.7–10.3)
Chloride: 104 mmol/L (ref 96–106)
Creatinine, Ser: 1.12 mg/dL — ABNORMAL HIGH (ref 0.57–1.00)
Globulin, Total: 2 g/dL (ref 1.5–4.5)
Glucose: 143 mg/dL — ABNORMAL HIGH (ref 65–99)
Potassium: 4.6 mmol/L (ref 3.5–5.2)
Sodium: 143 mmol/L (ref 134–144)
Total Protein: 6.2 g/dL (ref 6.0–8.5)
eGFR: 53 mL/min/{1.73_m2} — ABNORMAL LOW (ref >59)

## 2021-02-02 LAB — CBC WITH DIFFERENTIAL/PLATELET
Basophils Absolute: 0 10*3/uL (ref 0.0–0.2)
Basos: 1 %
EOS (ABSOLUTE): 0.2 10*3/uL (ref 0.0–0.4)
Eos: 2 %
Hematocrit: 39.8 % (ref 34.0–46.6)
Hemoglobin: 13.7 g/dL (ref 11.1–15.9)
Immature Grans (Abs): 0 10*3/uL (ref 0.0–0.1)
Immature Granulocytes: 0 %
Lymphocytes Absolute: 2.7 10*3/uL (ref 0.7–3.1)
Lymphs: 43 %
MCH: 29.1 pg (ref 26.6–33.0)
MCHC: 34.4 g/dL (ref 31.5–35.7)
MCV: 85 fL (ref 79–97)
Monocytes Absolute: 0.4 10*3/uL (ref 0.1–0.9)
Monocytes: 6 %
Neutrophils Absolute: 3.1 10*3/uL (ref 1.4–7.0)
Neutrophils: 48 %
Platelets: 259 10*3/uL (ref 150–450)
RBC: 4.7 x10E6/uL (ref 3.77–5.28)
RDW: 12.4 % (ref 11.7–15.4)
WBC: 6.4 10*3/uL (ref 3.4–10.8)

## 2021-02-02 LAB — LIPID PANEL
Chol/HDL Ratio: 2.8 ratio (ref 0.0–4.4)
Cholesterol, Total: 140 mg/dL (ref 100–199)
HDL: 50 mg/dL (ref >39)
LDL Chol Calc (NIH): 70 mg/dL (ref 0–99)
Triglycerides: 110 mg/dL (ref 0–149)
VLDL Cholesterol Cal: 20 mg/dL (ref 5–40)

## 2021-02-03 ENCOUNTER — Encounter: Payer: Self-pay | Admitting: Family Medicine

## 2021-02-04 NOTE — Progress Notes (Signed)
Hello Yvonne [name],  Your lab result is normal and/or stable.Some minor variations that are not significant are commonly marked abnormal, but do not represent any medical problem for you.  Best regards, Lotus Santillo, M.D.

## 2021-02-06 ENCOUNTER — Other Ambulatory Visit: Payer: Medicare HMO

## 2021-02-06 ENCOUNTER — Other Ambulatory Visit: Payer: Self-pay

## 2021-02-06 DIAGNOSIS — R072 Precordial pain: Secondary | ICD-10-CM

## 2021-02-06 DIAGNOSIS — R002 Palpitations: Secondary | ICD-10-CM | POA: Diagnosis not present

## 2021-02-06 LAB — BASIC METABOLIC PANEL
BUN/Creatinine Ratio: 20 (ref 12–28)
BUN: 20 mg/dL (ref 8–27)
CO2: 23 mmol/L (ref 20–29)
Calcium: 9.8 mg/dL (ref 8.7–10.3)
Chloride: 103 mmol/L (ref 96–106)
Creatinine, Ser: 1.02 mg/dL — ABNORMAL HIGH (ref 0.57–1.00)
Glucose: 150 mg/dL — ABNORMAL HIGH (ref 65–99)
Potassium: 3.8 mmol/L (ref 3.5–5.2)
Sodium: 140 mmol/L (ref 134–144)
eGFR: 59 mL/min/{1.73_m2} — ABNORMAL LOW (ref >59)

## 2021-02-07 ENCOUNTER — Ambulatory Visit (INDEPENDENT_AMBULATORY_CARE_PROVIDER_SITE_OTHER): Payer: Medicare HMO | Admitting: Licensed Clinical Social Worker

## 2021-02-07 DIAGNOSIS — E1122 Type 2 diabetes mellitus with diabetic chronic kidney disease: Secondary | ICD-10-CM

## 2021-02-07 DIAGNOSIS — J439 Emphysema, unspecified: Secondary | ICD-10-CM

## 2021-02-07 DIAGNOSIS — G43709 Chronic migraine without aura, not intractable, without status migrainosus: Secondary | ICD-10-CM

## 2021-02-07 DIAGNOSIS — I10 Essential (primary) hypertension: Secondary | ICD-10-CM

## 2021-02-07 DIAGNOSIS — E785 Hyperlipidemia, unspecified: Secondary | ICD-10-CM

## 2021-02-07 DIAGNOSIS — I5032 Chronic diastolic (congestive) heart failure: Secondary | ICD-10-CM

## 2021-02-07 DIAGNOSIS — N1831 Chronic kidney disease, stage 3a: Secondary | ICD-10-CM

## 2021-02-07 DIAGNOSIS — F411 Generalized anxiety disorder: Secondary | ICD-10-CM

## 2021-02-07 DIAGNOSIS — J441 Chronic obstructive pulmonary disease with (acute) exacerbation: Secondary | ICD-10-CM

## 2021-02-07 NOTE — Patient Instructions (Addendum)
Visit Information Chronic Care Management    Clinical Social Work Note  02/07/2021 Name: Yvonne [name]            MRN: [id number]       DOB: March 17, 1951  Yvonne [name] is a 70 y.o. year old female who is a primary care patient of [name], [name], MD. The CCM team was consulted to assist the patient with chronic disease management and/or care coordination needs related to: [employer] .   Engaged with patient by telephone for follow up visit in response to provider referral for social work chronic care management and care coordination services.   Consent to Services:  The patient was given information about Chronic Care Management services, agreed to services, and gave verbal consent prior to initiation of services.  Please see initial visit note for detailed documentation.   Patient agreed to services and consent obtained.   Assessment: Review of patient past medical history, allergies, medications, and health status, including review of relevant consultants reports was performed today as part of a comprehensive evaluation and provision of chronic care management and care coordination services.     SDOH (Social Determinants of Health) assessments and interventions performed:    Advanced Directives Status: See Vynca application for related entries.  PATIENT GOALS: Goals Addressed              This Visit's Progress   .  Managel Anxiety, Stress and Depression symptoms faced (pt-stated)         Timeframe: Short Term  This Visit's Progress: Not on Track  Priority : Medium  Start Date: 02/07/2021  Expected End Date 05/10/2021  Completed Date  Follow Up Date:  03/10/2021     Needs help with coping skills to manage anxiety, stress or depression symptoms faced :   Patient Self Care Activities:  . Self administers medications as prescribed . Attends all scheduled provider appointments . Performs ADL's independently  Patient Coping Strengths:  . Supportive  Relationships . Family . Self Advocate  Patient Self Care Deficits:  . Pain issues . Challenges with Diabetes management  Patient Goals:  - exercise at least 2 to 3 times per week - spend time or talk with others every day - practice relaxation or meditation daily - keep a calendar with appointment dates  Follow Up Plan: [name] to call client on 03/10/2021 to assess client needs       Follow Up Plan:  [name] to call client on 03/10/2021 to assess client needs  Yvonne Riffle.Forrest MSW, [name] Licensed Clinical Social Worker [facility] Care Management [phone]

## 2021-02-07 NOTE — Chronic Care Management (AMB) (Signed)
Chronic Care Management    Clinical Social Work Note  02/07/2021 Name: Yvonne [name] MRN: [id number] DOB: 1951-06-07  Yvonne [name] is a 70 y.o. year old female who is a primary care patient of [name], Broadus John, MD. The CCM team was consulted to assist the patient with chronic disease management and/or care coordination needs related to: [employer] .   Engaged with patient by telephone for follow up visit in response to provider referral for social work chronic care management and care coordination services.   Consent to Services:  The patient was given information about Chronic Care Management services, agreed to services, and gave verbal consent prior to initiation of services.  Please see initial visit note for detailed documentation.   Patient agreed to services and consent obtained.   Assessment: Review of patient past medical history, allergies, medications, and health status, including review of relevant consultants reports was performed today as part of a comprehensive evaluation and provision of chronic care management and care coordination services.     SDOH (Social Determinants of Health) assessments and interventions performed:    Advanced Directives Status: See Vynca application for related entries.  CCM Care Plan  Allergies  Allergen Reactions  . Penicillins Hives and Itching    Has patient had a PCN reaction causing immediate rash, facial/tongue/throat swelling, SOB or lightheadedness with hypotension: Yes Has patient had a PCN reaction causing severe rash involving mucus membranes or skin necrosis: Unk Has patient had a PCN reaction that required hospitalization: No Has patient had a PCN reaction occurring within the last 10 years: No If all of the above answers are "NO", then may proceed with Cephalosporin use.  Has patient had a PCN reaction causing immediate rash, facial/tongue/throat swelling, SOB or lightheadedness with hypotension: Yes Has patient had a  PCN reaction causing severe rash involving mucus membranes or skin necrosis: No Has patient had a PCN reaction that required hospitalization No Has patient had a PCN reaction occurring within the last 10 years: No If all of the above answers are "NO", then may proceed with Cephalosporin use. Has patient had a PCN reaction causing immediate rash, facial/tongue/throat swelling, SOB or lightheadedness with hypotension: Yes Has patient had a PCN reaction causing severe rash involving mucus membranes or skin necrosis: Unk Has patient had a PCN reaction that required hospitalization: No Has patient had a PCN reaction occurring within the last 10 years: No If all of the above answers are "NO", then may proceed with Cephalosporin use. Has patient had a PCN reaction causing immediate rash, facial/tongue/throat swelling, SOB or lightheadedness with hypotension: Yes Has patient had a PCN reaction causing severe rash involving mucus membranes or skin necrosis: No Has patient had a PCN reaction that required hospitalization No Has patient had a PCN reaction occurring within the last 10 years: No If all of the above answers are "NO", then may proceed with Cephalosporin use. Has patient had a PCN reaction causing immediate rash, facial/tongue/throat swelling, SOB or lightheadedness with hypotension: Yes Has patient had a PCN reaction causing sev... (TRUNCATED)  . Gabapentin Other (See Comments)    Causes a lot of lethargy at a higher dose Causes a lot of lethargy at a higher dose Causes a lot of lethargy at a higher dose  . Lisinopril Cough    HAS A CHRONIC COUGH AND DID NOT NOTICE IMPROVEMENT OF COUGH WHEN LISINOPRIL WAS STOPPED HAS A CHRONIC COUGH AND DID NOT NOTICE IMPROVEMENT OF COUGH WHEN LISINOPRIL WAS STOPPED HAS A  CHRONIC COUGH AND DID NOT NOTICE IMPROVEMENT OF COUGH WHEN LISINOPRIL WAS STOPPED  . Soap Rash    DIAL soap    Outpatient Encounter Medications as of 02/07/2021  Medication Sig  .  Accu-Chek Softclix Lancets lancets Test BS up to 4 times daily Dx E11.69  . albuterol (VENTOLIN HFA) 108 (90 Base) MCG/ACT inhaler TAKE 2 PUFFS BY MOUTH EVERY 6 HOURS AS NEEDED FOR WHEEZE OR SHORTNESS OF BREATH  . ALPRAZolam (XANAX) 1 MG tablet Take 1 tablet (1 mg total) by mouth 3 (three) times daily.  [name] Kitchen amLODipine (NORVASC) 10 MG tablet TAKE 1 TABLET BY MOUTH DAILY.  [name] Kitchen aspirin 325 MG tablet Take 325 mg by mouth daily. (Patient not taking: Reported on 02/01/2021)  . atorvastatin (LIPITOR) 40 MG tablet TAKE 1 TABLET EVERY DAY AT 6PM  . Blood Glucose Monitoring Suppl (ACCU-CHEK GUIDE) w/Device KIT Test Bs QID Dx E11.69  . canagliflozin (INVOKANA) 300 MG TABS tablet Take 1 tablet by mouth daily.  . clopidogrel (PLAVIX) 75 MG tablet Take 1 tablet (75 mg total) by mouth daily with breakfast.  . ezetimibe (ZETIA) 10 MG tablet Take 1 tablet (10 mg total) by mouth daily. For cholesterol  . fluticasone furoate-vilanterol (BREO ELLIPTA) 100-25 MCG/INH AEPB Inhale 1 puff into the lungs daily.  . furosemide (LASIX) 20 MG tablet Take 1 tablet (20 mg total) by mouth daily.  [name] Kitchen glipiZIDE (GLUCOTROL) 5 MG tablet Take 1 tablet (5 mg total) by mouth daily before breakfast.  . glucose blood (ACCU-CHEK GUIDE) test strip Test BS up to 4 times daily Dx E11.69  . loratadine (CLARITIN) 10 MG tablet Take 1 tablet (10 mg total) by mouth daily.  . metoprolol tartrate (LOPRESSOR) 50 MG tablet Take 2 hours prior to Cardiac CT  . QUEtiapine (SEROQUEL) 25 MG tablet Take 1 tablet (25 mg total) by mouth at bedtime.  . Semaglutide,0.25 or 0.5MG /DOS, (OZEMPIC, 0.25 OR 0.5 MG/DOSE,) 2 MG/1.5ML SOPN Inject 0.5 mg into the skin once a week. On Wednesday  . solifenacin (VESICARE) 10 MG tablet Take 1 tablet (10 mg total) by mouth daily.  [name] Kitchen triamcinolone (KENALOG) 0.1 % Apply 1 application topically 3 (three) times daily. (Patient not taking: Reported on 02/01/2021)   No facility-administered encounter medications on file as of 02/07/2021.     Patient Active Problem List   Diagnosis Date Noted  . Palpitations 01/20/2021  . Aortic atherosclerosis ([place]) 01/20/2021  . Orthostatic hypotension 01/20/2021  . Diabetic lipidosis ([place]) 09/09/2019  . Hypokalemia 12/11/2018  . Benzodiazepine dependence, continuous ([place]) 10/22/2018  . Chronic migraine without aura without status migrainosus, not intractable 02/19/2018  . Chronic diastolic CHF (congestive heart failure) ([place]) 03/21/2017  . Chronic bilateral low back pain with bilateral sciatica 02/27/2017  . Spondylosis of lumbar region without myelopathy or radiculopathy 08/15/2016  . Pain of both hip joints 06/18/2016  . Pain syndrome, chronic 06/18/2016  . Chronic cough 05/17/2016  . Hyperlipidemia with target LDL less than 70 11/04/2015  . Macular hole of right eye 09/14/2015  . Obesity 07/20/2015  . Osteopenia 07/20/2015  . Precordial pain 07/06/2015  . Overactive bladder   . Hypertension   . GAD (generalized anxiety disorder)   . Acid reflux disease 08/17/2014    Conditions to be addressed/monitored: ; Needs coping skills to help her manage anxiety, stress or depression symptoms  Care Plan : [place]  Updates made by Katha Cabal, [name] since 02/07/2021 12:00 AM    Problem: Emotional Distress  Goal: Needs help with coping skills to manage anxiety, stress, and depression symptoms faced   Start Date: 02/07/2021  Expected End Date: 05/10/2021  This Visit's Progress: Not on track  Priority: Medium  Note:   Current Barriers:  . Eating and swallowing is difficult because of inflammation in her throat . Pain issues . Suicidal Ideation/Homicidal Ideation: No . Financial challenges . Mental Health Challenges  Clinical Social Work Goal(s):  . patient will work with SW  by telephone or in person in next 30 days to discuss strategies for managing stress or anxiety or depression symptoms faced.  . Patient to talk with [name] or [name] as needed for CCM  support . Patient will take time for self care, relaxation and rest as needed in next 30 days  Interventions: . Patient interviewed and appropriate assessments performed:  . Provided mental health counseling with regard to symptoms faced . Talked with client about collaboration of client  with pharmacist related to medication needs for managing diabetes . Encouraged client to talk with [facility] for nursing support . Encouraged client to talk with Triage Nurse at [facility] as needed for nursing support . Encouraged client to practice self care by getting plenty of rest and relaxation or by doing hobbies of choice  Patient Self Care Activities:  . Self administers medications as prescribed . Attends all scheduled provider appointments . Performs ADL's independently  Patient Coping Strengths:  . Supportive Relationships . Family . Self Advocate  Patient Self Care Deficits:  . Pain issues . Challenges with Diabetes management  Patient Goals:  - exercise at least 2 to 3 times per week - spend time or talk with others every day - practice relaxation or meditation daily - keep a calendar with appointment dates  Follow Up Plan: [name] to call client on 03/10/2021 to assess client needs      [name].Aleida Crandell MSW, [name] Licensed Clinical Social Worker [facility] Care Management [phone]

## 2021-02-08 ENCOUNTER — Telehealth (HOSPITAL_COMMUNITY): Payer: Self-pay | Admitting: *Deleted

## 2021-02-08 NOTE — Telephone Encounter (Signed)
Reaching out to patient to offer assistance regarding upcoming cardiac imaging study; pt verbalizes understanding of appt date/time, parking situation and where to check in, pre-test NPO status and medications ordered, and verified current allergies; name and call back number provided for further questions should they arise  Gordy Clement RN Navigator Cardiac [place] [facility] [phone] office [phone] cell  Pt states she will take a Xanax prior to scan but her husband will bring her to the scan.

## 2021-02-09 ENCOUNTER — Other Ambulatory Visit: Payer: Self-pay

## 2021-02-09 ENCOUNTER — Ambulatory Visit (HOSPITAL_COMMUNITY)
Admission: RE | Admit: 2021-02-09 | Discharge: 2021-02-09 | Disposition: A | Discharge status: Home / Self Care | Payer: Medicare HMO | Source: Ambulatory Visit | Attending: Internal Medicine | Admitting: Internal Medicine

## 2021-02-09 DIAGNOSIS — I700 Atherosclerosis of aorta: Secondary | ICD-10-CM | POA: Diagnosis not present

## 2021-02-09 DIAGNOSIS — R931 Abnormal findings on diagnostic imaging of heart and coronary circulation: Secondary | ICD-10-CM | POA: Insufficient documentation

## 2021-02-09 DIAGNOSIS — R072 Precordial pain: Secondary | ICD-10-CM | POA: Diagnosis not present

## 2021-02-09 DIAGNOSIS — I2510 Atherosclerotic heart disease of native coronary artery without angina pectoris: Secondary | ICD-10-CM | POA: Diagnosis not present

## 2021-02-09 LAB — TOXASSURE SELECT 13 (MW), URINE

## 2021-02-09 MED ORDER — NITROGLYCERIN 0.4 MG SL SUBL: 0.8000 mg | SUBLINGUAL_TABLET | Freq: Once | SUBLINGUAL | Status: AC

## 2021-02-09 MED ORDER — NITROGLYCERIN 0.4 MG SL SUBL
SUBLINGUAL_TABLET | SUBLINGUAL | Status: AC
  Administered 2021-02-09: 0.8 mg via SUBLINGUAL
  Filled 2021-02-09: qty 2

## 2021-02-09 MED ORDER — IOHEXOL 350 MG/ML SOLN
100.0000 mL | Freq: Once | INTRAVENOUS | Status: AC | PRN
  Administered 2021-02-09: 100 mL via INTRAVENOUS

## 2021-02-10 DIAGNOSIS — R002 Palpitations: Secondary | ICD-10-CM | POA: Diagnosis not present

## 2021-02-12 DIAGNOSIS — I2510 Atherosclerotic heart disease of native coronary artery without angina pectoris: Secondary | ICD-10-CM | POA: Diagnosis not present

## 2021-02-15 ENCOUNTER — Telehealth: Payer: Self-pay | Admitting: Internal Medicine

## 2021-02-15 ENCOUNTER — Telehealth: Payer: Self-pay | Admitting: *Deleted

## 2021-02-15 MED ORDER — ATORVASTATIN CALCIUM 80 MG PO TABS: 80.0000 mg | ORAL_TABLET | Freq: Every day | ORAL | 2 refills | Status: DC

## 2021-02-15 MED ORDER — NITROGLYCERIN 0.4 MG SL SUBL: 0.4000 mg | SUBLINGUAL_TABLET | SUBLINGUAL | 10 refills | Status: DC | PRN

## 2021-02-15 NOTE — Telephone Encounter (Signed)
Yvonne Lean, MD  02/12/2021 12:16 PM EDT      Results: Distal LAD disease FFR positive Plan: PRN Nitro  Atorvastatin to 80 mg and follow up testing  Yvonne Lean, MD        Spoke with the pt and endorsed to her, her Coronary CT results and recommendations per Dr. Gasper Sells for her to increase her atorvastatin to 80 mg po daily and we will call her in PRN nitroglycerin SL.  Confirmed the pharmacy of choice with the pt. Pt education provided to the pt on how to correctly administer Nitro. Pt states she will call the pharmacy for further refills of her atorvastatin, for she will use her current supply on hand first.  Note placed about this to her pharmacy. Pt verbalized understanding and agrees with this plan.

## 2021-02-15 NOTE — Telephone Encounter (Signed)
CT CORONARY MORPH W/CTA COR W/SCORE W/CA W/CM &/OR WO/CM: Result Notes   Werner Lean, MD  02/10/2021 2:30 PM EDT      Results: Normal Flow Plan: Risk Factor Reduction  Werner Lean, MD    LONG TERM MONITOR (3-14 DAYS): Result Notes   Werner Lean, MD  02/12/2021 11:34 AM EDT      Results: No malignant arrhythmias. Plan: No change in plans  Werner Lean, MD     The patient has been notified of the results of her Coronary CT and monitor and verbalized understanding.  All questions (if any) were answered. Nuala Alpha, LPN 4/61/9012 22:41 PM

## 2021-02-15 NOTE — Telephone Encounter (Signed)
Patient states she is returning a call to discuss her CT results.

## 2021-02-15 NOTE — Telephone Encounter (Signed)
-----   Message from Werner Lean, MD sent at 02/12/2021 12:16 PM EDT ----- Results: Distal LAD disease FFR positive Plan: PRN Nitro  Atorvastatin to 80 mg and follow up testing  Werner Lean, MD

## 2021-02-15 NOTE — Telephone Encounter (Signed)
Spoke with the pt and endorsed to her, her Coronary CT results and recommendations per Dr. Gasper Sells for her to increase her atorvastatin to 80 mg po daily and we will call her in PRN nitroglycerin SL.  Confirmed the pharmacy of choice with the pt. Pt education provided to the pt on how to correctly administer Nitro. Pt states she will call the pharmacy for further refills of her atorvastatin, for she will use her current supply on hand first.  Note placed about this to her pharmacy. Pt verbalized understanding and agrees with this plan.

## 2021-02-22 ENCOUNTER — Ambulatory Visit: Payer: Medicare HMO | Admitting: *Deleted

## 2021-02-22 DIAGNOSIS — N1831 Chronic kidney disease, stage 3a: Secondary | ICD-10-CM | POA: Diagnosis not present

## 2021-02-22 DIAGNOSIS — F411 Generalized anxiety disorder: Secondary | ICD-10-CM

## 2021-02-22 DIAGNOSIS — I10 Essential (primary) hypertension: Secondary | ICD-10-CM

## 2021-02-22 DIAGNOSIS — J439 Emphysema, unspecified: Secondary | ICD-10-CM

## 2021-02-22 DIAGNOSIS — I5032 Chronic diastolic (congestive) heart failure: Secondary | ICD-10-CM | POA: Diagnosis not present

## 2021-02-22 DIAGNOSIS — E1122 Type 2 diabetes mellitus with diabetic chronic kidney disease: Secondary | ICD-10-CM

## 2021-02-22 DIAGNOSIS — E785 Hyperlipidemia, unspecified: Secondary | ICD-10-CM

## 2021-02-22 DIAGNOSIS — J441 Chronic obstructive pulmonary disease with (acute) exacerbation: Secondary | ICD-10-CM

## 2021-02-22 NOTE — Addendum Note (Signed)
Addended by: Ilean China on: 02/22/2021 03:31 PM   Modules accepted: Orders

## 2021-02-22 NOTE — Patient Instructions (Signed)
Visit Information  PATIENT GOALS: Goals Addressed            This Visit's Progress   . Manage Cholesterol   On track    Timeframe:  Long-Range Goal Priority:  Medium Start Date:      02/22/21                       Expected End Date:   02/22/22                    Follow-up: 04/13/21  . Follow-up with cardiologist as planned on 04/19/21 . Take Lipitor 80 mg as prescribed . Call cardiologist or PCP with any muscle aches/pains . Plate method diet with 1/2 plate of vegetables, 1/4 lean protein, 1/4 starch . Increase activity level as tolerated with a goal of at least 150 min a week . Call RN Care Manager as needed [phone]    . Monitor and Manage My Blood Sugar-Diabetes Type 2   Not on track    Timeframe:  Long-Range Goal Priority:  Medium Start Date:     02/22/21                        Expected End Date: 02/22/22                      Follow Up Date 04/13/21   . Talk with [name] regarding psychosocial issues . Eat at least 3 meals a day . 1/2 plate of vegetables, 1/4 protein, and 1/4 starch . Pair protein, like cheese or nuts, with a carbohydrate, like a piece of fruit, for a snack . Check blood sugar twice daily and record . Call PCP with any readings outside of recommended range . Increase activity level as tolerated with a goal of 150 minutes a week . Call RN Care Manager as needed [phone]    Why is this important?    Checking your blood sugar at home helps to keep it from getting very high or very low.   Writing the results in a diary or log helps the doctor know how to care for you.   Your blood sugar log should have the time, date and the results.   Also, write down the amount of insulin or other medicine that you take.   Other information, like what you ate, exercise done and how you were feeling, will also be helpful.     Notes:     . Stress/Anxiety Managed   Not on track    Timeframe:  Short-Term Goal Priority:  High Start Date:     02/22/21                         Expected End Date:   04/24/21                     Follow-up: [name] 03/10/21  . Talk with [name] regarding stress/anxiety over difficult family/home situation       Patient verbalizes understanding of instructions provided today and agrees to view in [place].    Follow Up Plan:  . Telephone follow up appointment with care management team member scheduled for: [name] 03/10/21, [facility] 04/13/21 . The patient has been provided with contact information for the care management team and has been advised to call with any health related questions or concerns.  . Next PCP appointment scheduled for: 05/04/21  with Dr [name] . Next cardiology appointment scheduled for: 04/19/21  [name], BSN, RN-BC [place] [facility] [place] [facility] Direct Dial: [phone]

## 2021-02-22 NOTE — Chronic Care Management (AMB) (Signed)
Chronic Care Management   CCM RN Visit Note  02/22/2021 Name: Yvonne [name] MRN: [id number] DOB: 11/21/1951  Subjective: Yvonne [name] is a 70 y.o. year old female who is a primary care patient of [name], [name], MD. The care management team was consulted for assistance with disease management and care coordination needs.    Engaged with patient by telephone for follow up visit in response to provider referral for case management and/or care coordination services.   Consent to Services:  The patient was given information about Chronic Care Management services, agreed to services, and gave verbal consent prior to initiation of services.  Please see initial visit note for detailed documentation.   Patient agreed to services and verbal consent obtained.   Assessment: Review of patient past medical history, allergies, medications, health status, including review of consultants reports, laboratory and other test data, was performed as part of comprehensive evaluation and provision of chronic care management services.   SDOH (Social Determinants of Health) assessments and interventions performed:    CCM Care Plan  Allergies  Allergen Reactions  . Penicillins Hives and Itching    Has patient had a PCN reaction causing immediate rash, facial/tongue/throat swelling, SOB or lightheadedness with hypotension: Yes Has patient had a PCN reaction causing severe rash involving mucus membranes or skin necrosis: Unk Has patient had a PCN reaction that required hospitalization: No Has patient had a PCN reaction occurring within the last 10 years: No If all of the above answers are "NO", then may proceed with Cephalosporin use.  Has patient had a PCN reaction causing immediate rash, facial/tongue/throat swelling, SOB or lightheadedness with hypotension: Yes Has patient had a PCN reaction causing severe rash involving mucus membranes or skin necrosis: No Has patient had a PCN reaction that required  hospitalization No Has patient had a PCN reaction occurring within the last 10 years: No If all of the above answers are "NO", then may proceed with Cephalosporin use. Has patient had a PCN reaction causing immediate rash, facial/tongue/throat swelling, SOB or lightheadedness with hypotension: Yes Has patient had a PCN reaction causing severe rash involving mucus membranes or skin necrosis: Unk Has patient had a PCN reaction that required hospitalization: No Has patient had a PCN reaction occurring within the last 10 years: No If all of the above answers are "NO", then may proceed with Cephalosporin use. Has patient had a PCN reaction causing immediate rash, facial/tongue/throat swelling, SOB or lightheadedness with hypotension: Yes Has patient had a PCN reaction causing severe rash involving mucus membranes or skin necrosis: No Has patient had a PCN reaction that required hospitalization No Has patient had a PCN reaction occurring within the last 10 years: No If all of the above answers are "NO", then may proceed with Cephalosporin use. Has patient had a PCN reaction causing immediate rash, facial/tongue/throat swelling, SOB or lightheadedness with hypotension: Yes Has patient had a PCN reaction causing sev... (TRUNCATED)  . Gabapentin Other (See Comments)    Causes a lot of lethargy at a higher dose Causes a lot of lethargy at a higher dose Causes a lot of lethargy at a higher dose  . Lisinopril Cough    HAS A CHRONIC COUGH AND DID NOT NOTICE IMPROVEMENT OF COUGH WHEN LISINOPRIL WAS STOPPED HAS A CHRONIC COUGH AND DID NOT NOTICE IMPROVEMENT OF COUGH WHEN LISINOPRIL WAS STOPPED HAS A CHRONIC COUGH AND DID NOT NOTICE IMPROVEMENT OF COUGH WHEN LISINOPRIL WAS STOPPED  . Soap Rash    DIAL soap  Outpatient Encounter Medications as of 02/22/2021  Medication Sig  . Accu-Chek Softclix Lancets lancets Test BS up to 4 times daily Dx E11.69  . albuterol (VENTOLIN HFA) 108 (90 Base) MCG/ACT  inhaler TAKE 2 PUFFS BY MOUTH EVERY 6 HOURS AS NEEDED FOR WHEEZE OR SHORTNESS OF BREATH  . ALPRAZolam (XANAX) 1 MG tablet Take 1 tablet (1 mg total) by mouth 3 (three) times daily.  [name] Kitchen amLODipine (NORVASC) 10 MG tablet TAKE 1 TABLET BY MOUTH DAILY.  [name] Kitchen aspirin 325 MG tablet Take 325 mg by mouth daily. (Patient not taking: Reported on 02/01/2021)  . atorvastatin (LIPITOR) 80 MG tablet Take 1 tablet (80 mg total) by mouth daily.  . Blood Glucose Monitoring Suppl (ACCU-CHEK GUIDE) w/Device KIT Test Bs QID Dx E11.69  . canagliflozin (INVOKANA) 300 MG TABS tablet Take 1 tablet by mouth daily.  . clopidogrel (PLAVIX) 75 MG tablet Take 1 tablet (75 mg total) by mouth daily with breakfast.  . ezetimibe (ZETIA) 10 MG tablet Take 1 tablet (10 mg total) by mouth daily. For cholesterol  . fluticasone furoate-vilanterol (BREO ELLIPTA) 100-25 MCG/INH AEPB Inhale 1 puff into the lungs daily.  . furosemide (LASIX) 20 MG tablet Take 1 tablet (20 mg total) by mouth daily.  [name] Kitchen glipiZIDE (GLUCOTROL) 5 MG tablet Take 1 tablet (5 mg total) by mouth daily before breakfast.  . glucose blood (ACCU-CHEK GUIDE) test strip Test BS up to 4 times daily Dx E11.69  . loratadine (CLARITIN) 10 MG tablet Take 1 tablet (10 mg total) by mouth daily.  . metoprolol tartrate (LOPRESSOR) 50 MG tablet Take 2 hours prior to Cardiac CT  . nitroGLYCERIN (NITROSTAT) 0.4 MG SL tablet Place 1 tablet (0.4 mg total) under the tongue every 5 (five) minutes as needed for chest pain.  [name] Kitchen QUEtiapine (SEROQUEL) 25 MG tablet Take 1 tablet (25 mg total) by mouth at bedtime.  . Semaglutide,0.25 or 0.5MG /DOS, (OZEMPIC, 0.25 OR 0.5 MG/DOSE,) 2 MG/1.5ML SOPN Inject 0.5 mg into the skin once a week. On Wednesday  . solifenacin (VESICARE) 10 MG tablet Take 1 tablet (10 mg total) by mouth daily.  [name] Kitchen triamcinolone (KENALOG) 0.1 % Apply 1 application topically 3 (three) times daily. (Patient not taking: Reported on 02/01/2021)   No facility-administered encounter  medications on file as of 02/22/2021.    Patient Active Problem List   Diagnosis Date Noted  . Palpitations 01/20/2021  . Aortic atherosclerosis ([place]) 01/20/2021  . Orthostatic hypotension 01/20/2021  . Diabetic lipidosis ([place]) 09/09/2019  . Hypokalemia 12/11/2018  . Benzodiazepine dependence, continuous ([place]) 10/22/2018  . Chronic migraine without aura without status migrainosus, not intractable 02/19/2018  . Chronic diastolic CHF (congestive heart failure) ([place]) 03/21/2017  . Chronic bilateral low back pain with bilateral sciatica 02/27/2017  . Spondylosis of lumbar region without myelopathy or radiculopathy 08/15/2016  . Pain of both hip joints 06/18/2016  . Pain syndrome, chronic 06/18/2016  . Chronic cough 05/17/2016  . Hyperlipidemia with target LDL less than 70 11/04/2015  . Macular hole of right eye 09/14/2015  . Obesity 07/20/2015  . Osteopenia 07/20/2015  . Precordial pain 07/06/2015  . Overactive bladder   . Hypertension   . GAD (generalized anxiety disorder)   . Acid reflux disease 08/17/2014    Conditions to be addressed/monitored:HTN, HLD, DMII, Anxiety and Depression  Care Plan : [name]: Diabetes Type 2 (Adult)  Updates made by Ilean China, RN since 02/22/2021 12:00 AM    Problem: Glycemic Management (Diabetes, Type 2)   Priority:  Medium    Long-Range Goal: Glycemic Management Optimized   Start Date: 02/22/2021  This Visit's Progress: Not on track  Priority: Medium  Note:   Objective: Lab Results  Component Value Date   HGBA1C 7.8 (H) 02/01/2021   HGBA1C 8.2 (H) 11/02/2020   HGBA1C 7.9 (H) 08/02/2020   Lab Results  Component Value Date   LDLCALC 70 02/01/2021   CREATININE 1.02 (H) 02/06/2021   Current Barriers:  . Chronic Disease Management support and education needs related to diabetes in a patient with HLD and HTN . Irregular eating pattern . Stress related to family situation  Nurse Case Manager Clinical Goal(s):  . patient will work with  PCP to address needs related to medical management of diabetes . patient will meet with RN Care Manager to address self-management of diabetes  Interventions:  . 1:1 collaboration with [name], MD regarding development and update of comprehensive plan of care as evidenced by provider attestation and co-signature . Inter-disciplinary care team collaboration (see longitudinal plan of care) . Evaluation of current treatment plan related to diabetes and patient's adherence to plan as established by provider. . Chart reviewed including relevant office notes and lab results . Discussed recent office visit and A1C improvement . Reviewed and discussed medications: glipizide and farxiga o Receiving Farxiga through prescription assistance program o Compliant with medications . Medication list updated to accurately reflect what patient is taking . Discussed diet o Patient typically eats one meal a day and that is in the evening o Does not feel hungry during the day. This is how she has eaten for a long time.  . Diet education provided o Recommended she eat at least 3 meals a day o Plate method with 1/2 plate of vegetables, 1/4 with protein, and 1/4 with starch o Always pair a protein with a carbohydrate when eating a snack or a meal . Encouraged increasing physical activity level with a goal of at least 150 min a week . Discussed home blood sugar testing o Testing twice a day o No readings below 70 . Reviewed upcoming appointments with PCP and [name] . Encouraged to talk with [name] regarding psychosocial issues . Provided with [name] contact number and encouraged to reach out as needed  Patient Goals/Self-Care Activities Over the next 60 days, patient will: . Talk with [name] regarding psychosocial issues . Eat at least 3 meals a day . 1/2 plate of vegetables, 1/4 protein, and 1/4 starch . Pair protein, like cheese or nuts, with a carbohydrate, like a piece of fruit, for a snack . Check blood  sugar twice daily and record . Call PCP with any readings outside of recommended range . Increase activity level as tolerated with a goal of 150 minutes a week . Call RN Care Manager as needed [phone]    Care Plan : [name]: Wellness  Updates made by Ilean China, RN since 02/22/2021 12:00 AM    Problem: Hyperlipidemia   Priority: Medium    Long-Range Goal: Manage Cholesterol   Start Date: 02/22/2021  This Visit's Progress: On track  Priority: Medium  Note:   Current Barriers:  . Chronic Disease Management support and education needs related to hyperlipidemia in a patient with HTN and DM . Stress from family situation  Nurse Case Manager Clinical Goal(s):  . patient will work with PCP to address needs related to medical management of hyperlipidemia . patient will meet with RN Care Manager to address self-management of hyperlipidemia  Interventions:  . 1:1  collaboration with [name], MD regarding development and update of comprehensive plan of care as evidenced by provider attestation and co-signature . Inter-disciplinary care team collaboration (see longitudinal plan of care) . Evaluation of current treatment plan related to hyperlipidemia and patient's adherence to plan as established by provider. . Chart reviewed including relevant PCP office notes, lab results, and cardiology notes . Reviewed and discussed medications o Cardiologist increased lipitor to $RemoveBe'80mg'[id number]$  daily - Patient is tolerating well. No myalgias. . Dicussed diet o Patient eats one meal a day in the evening - Does not feel hungry during the day. She has eaten this way for a long time. . Diet education provided o Plate method: 1/2 vegetables, 1/4 lean protein, 1/4 starch . Encouraged to increase physical activity level as tolerated with a goal of at least 150 minutes a week . Reviewed upcoming appointments . Provided with [name] contact number and encouraged to reach out as needed  Patient Goals/Self-Care  Activities Over the next 60 days, patient will: . Follow-up with cardiologist as planned on 04/19/21 . Take Lipitor 80 mg as prescribed . Call cardiologist or PCP with any muscle aches/pains . Plate method diet with 1/2 plate of vegetables, 1/4 lean protein, 1/4 starch . Increase activity level as tolerated with a goal of at least 150 min a week . Call RN Care Manager as needed [phone]     Problem: Stress/Anxiety   Priority: High    Goal: Stress/Anxiety Managed   Start Date: 02/22/2021  This Visit's Progress: Not on track  Priority: High  Note:   Current Barriers:  . Care Coordination needs related to stress/anxiety in a patient with DM, HLD, HTN, GAD, and depression . Family related stress  Nurse Case Manager Clinical Goal(s):  . patient will work with [name] to address needs related to stress/anxiety  Interventions:  . 1:1 collaboration with [name], MD regarding development and update of comprehensive plan of care as evidenced by provider attestation and co-signature . Inter-disciplinary care team collaboration (see longitudinal plan of care) . Chart reviewed including recent PCP office note o Recommendation by Dr [name] for patient to talk with [facility] regarding difficult family/home situation noted o Recent [name] note reviewed . Collaborated with [name] regarding patient's stress/anxiety and Dr Artemio Aly recommendation to reach out to [facility] o [name] will talk with patient about this at their next telephone visit . Reviewed upcoming appoint with [name] on 03/10/21  Patient Goals/Self-Care Activities Over the next 30 days, patient will: . Talk with [name] regarding stress/anxiety over difficult family/home situation    Follow Up Plan:  . Telephone follow up appointment with care management team member scheduled for: [name] 03/10/21, [facility] 04/13/21 . The patient has been provided with contact information for the care management team and has been advised to  call with any health related questions or concerns.  . Next PCP appointment scheduled for: 05/04/21 with Dr [name] . Next cardiology appointment scheduled for: 04/19/21  [name], BSN, RN-BC [place] [facility] [place] [facility] Direct Dial: [phone]

## 2021-02-27 DIAGNOSIS — G4700 Insomnia, unspecified: Secondary | ICD-10-CM | POA: Insufficient documentation

## 2021-02-27 DIAGNOSIS — E1142 Type 2 diabetes mellitus with diabetic polyneuropathy: Secondary | ICD-10-CM | POA: Insufficient documentation

## 2021-02-27 DIAGNOSIS — G2581 Restless legs syndrome: Secondary | ICD-10-CM | POA: Insufficient documentation

## 2021-02-28 ENCOUNTER — Ambulatory Visit (INDEPENDENT_AMBULATORY_CARE_PROVIDER_SITE_OTHER): Payer: Medicare HMO

## 2021-02-28 VITALS — BP 137/74 | Ht 62.0 in | Wt 176.0 lb

## 2021-02-28 DIAGNOSIS — Z0000 Encounter for general adult medical examination without abnormal findings: Secondary | ICD-10-CM

## 2021-02-28 DIAGNOSIS — R079 Chest pain, unspecified: Secondary | ICD-10-CM | POA: Diagnosis not present

## 2021-02-28 DIAGNOSIS — Z1231 Encounter for screening mammogram for malignant neoplasm of breast: Secondary | ICD-10-CM

## 2021-02-28 DIAGNOSIS — Z780 Asymptomatic menopausal state: Secondary | ICD-10-CM

## 2021-02-28 DIAGNOSIS — I6381 Other cerebral infarction due to occlusion or stenosis of small artery: Secondary | ICD-10-CM | POA: Diagnosis not present

## 2021-02-28 DIAGNOSIS — I951 Orthostatic hypotension: Secondary | ICD-10-CM | POA: Diagnosis not present

## 2021-02-28 DIAGNOSIS — E1142 Type 2 diabetes mellitus with diabetic polyneuropathy: Secondary | ICD-10-CM | POA: Diagnosis not present

## 2021-02-28 MED ORDER — ACCU-CHEK GUIDE W/DEVICE KIT
PACK | 0 refills | Status: DC
Start: 2021-02-28 — End: 2024-05-25

## 2021-02-28 NOTE — Patient Instructions (Signed)
[name] , Thank you for taking time to come for your Medicare Wellness Visit. I appreciate your ongoing commitment to your health goals. Please review the following plan we discussed and let me know if I can assist you in the future.   Screening recommendations/referrals: Colonoscopy: Done 08/17/2015 - Repeat in 7 years (2023) Mammogram: Done 05/18/2020 - Repeat annually (ordered today) Bone Density: Done 12/05/2018 - Repeat 2 years (ordered today) Recommended yearly ophthalmology/optometry visit for glaucoma screening and checkup Recommended yearly dental visit for hygiene and checkup  Vaccinations: Influenza vaccine: Declined last flu season Pneumococcal vaccine: Done 01/06/2015 &10/22/2018  Tdap vaccine: Done 06/06/2017 Shingles vaccine: Done 06/06/2017 & 02/26/2018   Covid-19: 2 doses complete: 06/14/2020 & 07/12/2020 - Due for booster  Advanced directives: Advance directive discussed with you today. I have provided a copy for you to complete at home and have notarized. Once this is complete please bring a copy in to our office so we can scan it into your chart.  Conditions/risks identified: Keep up the great work! Keep walking every day - Go by diabetic diet to help get sugars under control.  Next appointment: Follow up in one year for your annual wellness visit    Preventive Care 65 Years and Older, Female Preventive care refers to lifestyle choices and visits with your health care provider that can promote health and wellness. What does preventive care include?  A yearly physical exam. This is also called an annual well check.  Dental exams once or twice a year.  Routine eye exams. Ask your health care provider how often you should have your eyes checked.  Personal lifestyle choices, including:  Daily care of your teeth and gums.  Regular physical activity.  Eating a healthy diet.  Avoiding tobacco and drug use.  Limiting alcohol use.  Practicing safe sex.  Taking  low-dose aspirin every day.  Taking vitamin and mineral supplements as recommended by your health care provider. What happens during an annual well check? The services and screenings done by your health care provider during your annual well check will depend on your age, overall health, lifestyle risk factors, and family history of disease. Counseling  Your health care provider may ask you questions about your:  Alcohol use.  Tobacco use.  Drug use.  Emotional well-being.  Home and relationship well-being.  Sexual activity.  Eating habits.  History of falls.  Memory and ability to understand (cognition).  Work and work Statistician.  Reproductive health. Screening  You may have the following tests or measurements:  Height, weight, and BMI.  Blood pressure.  Lipid and cholesterol levels. These may be checked every 5 years, or more frequently if you are over 68 years old.  Skin check.  Lung cancer screening. You may have this screening every year starting at age 88 if you have a 30-pack-year history of smoking and currently smoke or have quit within the past 15 years.  Fecal occult blood test (FOBT) of the stool. You may have this test every year starting at age 75.  Flexible sigmoidoscopy or colonoscopy. You may have a sigmoidoscopy every 5 years or a colonoscopy every 10 years starting at age 39.  Hepatitis C blood test.  Hepatitis B blood test.  Sexually transmitted disease (STD) testing.  Diabetes screening. This is done by checking your blood sugar (glucose) after you have not eaten for a while (fasting). You may have this done every 1-3 years.  Bone density scan. This is done to screen  for osteoporosis. You may have this done starting at age 66.  Mammogram. This may be done every 1-2 years. Talk to your health care provider about how often you should have regular mammograms. Talk with your health care provider about your test results, treatment options, and  if necessary, the need for more tests. Vaccines  Your health care provider may recommend certain vaccines, such as:  Influenza vaccine. This is recommended every year.  Tetanus, diphtheria, and acellular pertussis (Tdap, Td) vaccine. You may need a Td booster every 10 years.  Zoster vaccine. You may need this after age 71.  Pneumococcal 13-valent conjugate (PCV13) vaccine. One dose is recommended after age 5.  Pneumococcal polysaccharide (PPSV23) vaccine. One dose is recommended after age 78. Talk to your health care provider about which screenings and vaccines you need and how often you need them. This information is not intended to replace advice given to you by your health care provider. Make sure you discuss any questions you have with your health care provider. Document Released: 12/09/2015 Document Revised: 08/01/2016 Document Reviewed: 09/13/2015 Elsevier Interactive Patient Education  2017 [place] Prevention in the Home Falls can cause injuries. They can happen to people of all ages. There are many things you can do to make your home safe and to help prevent falls. What can I do on the outside of my home?  Regularly fix the edges of walkways and driveways and fix any cracks.  Remove anything that might make you trip as you walk through a door, such as a raised step or threshold.  Trim any bushes or trees on the path to your home.  Use bright outdoor lighting.  Clear any walking paths of anything that might make someone trip, such as rocks or tools.  Regularly check to see if handrails are loose or broken. Make sure that both sides of any steps have handrails.  Any raised decks and porches should have guardrails on the edges.  Have any leaves, snow, or ice cleared regularly.  Use sand or salt on walking paths during winter.  Clean up any spills in your garage right away. This includes oil or grease spills. What can I do in the bathroom?  Use night  lights.  Install grab bars by the toilet and in the tub and shower. Do not use towel bars as grab bars.  Use non-skid mats or decals in the tub or shower.  If you need to sit down in the shower, use a plastic, non-slip stool.  Keep the floor dry. Clean up any water that spills on the floor as soon as it happens.  Remove soap buildup in the tub or shower regularly.  Attach bath mats securely with double-sided non-slip rug tape.  Do not have throw rugs and other things on the floor that can make you trip. What can I do in the bedroom?  Use night lights.  Make sure that you have a light by your bed that is easy to reach.  Do not use any sheets or blankets that are too big for your bed. They should not hang down onto the floor.  Have a firm chair that has side arms. You can use this for support while you get dressed.  Do not have throw rugs and other things on the floor that can make you trip. What can I do in the kitchen?  Clean up any spills right away.  Avoid walking on wet floors.  Keep items that you  use a lot in easy-to-reach places.  If you need to reach something above you, use a strong step stool that has a grab bar.  Keep electrical cords out of the way.  Do not use floor polish or wax that makes floors slippery. If you must use wax, use non-skid floor wax.  Do not have throw rugs and other things on the floor that can make you trip. What can I do with my stairs?  Do not leave any items on the stairs.  Make sure that there are handrails on both sides of the stairs and use them. Fix handrails that are broken or loose. Make sure that handrails are as long as the stairways.  Check any carpeting to make sure that it is firmly attached to the stairs. Fix any carpet that is loose or worn.  Avoid having throw rugs at the top or bottom of the stairs. If you do have throw rugs, attach them to the floor with carpet tape.  Make sure that you have a light switch at the  top of the stairs and the bottom of the stairs. If you do not have them, ask someone to add them for you. What else can I do to help prevent falls?  Wear shoes that:  Do not have high heels.  Have rubber bottoms.  Are comfortable and fit you well.  Are closed at the toe. Do not wear sandals.  If you use a stepladder:  Make sure that it is fully opened. Do not climb a closed stepladder.  Make sure that both sides of the stepladder are locked into place.  Ask someone to hold it for you, if possible.  Clearly mark and make sure that you can see:  Any grab bars or handrails.  First and last steps.  Where the edge of each step is.  Use tools that help you move around (mobility aids) if they are needed. These include:  Canes.  Walkers.  Scooters.  Crutches.  Turn on the lights when you go into a dark area. Replace any light bulbs as soon as they burn out.  Set up your furniture so you have a clear path. Avoid moving your furniture around.  If any of your floors are uneven, fix them.  If there are any pets around you, be aware of where they are.  Review your medicines with your doctor. Some medicines can make you feel dizzy. This can increase your chance of falling. Ask your doctor what other things that you can do to help prevent falls. This information is not intended to replace advice given to you by your health care provider. Make sure you discuss any questions you have with your health care provider. Document Released: 09/08/2009 Document Revised: 04/19/2016 Document Reviewed: 12/17/2014 Elsevier Interactive Patient Education  2017 [employer].

## 2021-02-28 NOTE — Progress Notes (Signed)
Subjective:   Yvonne [name] is a 70 y.o. female who presents for Medicare Annual (Subsequent) preventive examination. Virtual Visit via Telephone Note  I connected with  [name] on 02/28/21 at 11:15 AM EDT by telephone and verified that I am speaking with the correct person using two identifiers.  Location: Patient: Home  Provider: [facility] Persons participating in the virtual visit: patient/Nurse Health Advisor   I discussed the limitations, risks, security and privacy concerns of performing an evaluation and management service by telephone and the availability of in person appointments. The patient expressed understanding and agreed to proceed.  Interactive audio and video telecommunications were attempted between this nurse and patient, however failed, due to patient having technical difficulties OR patient did not have access to video capability.  We continued and completed visit with audio only.  Some vital signs may be absent or patient reported.    E , LPN  Review of Systems     Cardiac Risk Factors include: advanced age (>48mn, >>76women);diabetes mellitus;obesity (BMI >30kg/m2);dyslipidemia;hypertension     Objective:    Today's Vitals   02/28/21 1047  BP: 137/74  Weight: 176 lb (79.8 kg)  Height: 5' 2" (1.575 m)  PainSc: 8    Body mass index is 32.19 kg/m.  Advanced Directives 02/28/2021 01/18/2020 06/10/2019 12/12/2018 12/11/2018 12/02/2018 06/06/2018  Does Patient Have a Medical Advance Directive? _0  No No  Would patient like information on creating a medical advance directive? Yes (MAU/Ambulatory/Procedural Areas - Information given) No - Patient declined No - Patient declined No - Patient declined - Yes (MAU/Ambulatory/Procedural Areas - Information given) -    Current Medications (verified) Outpatient Encounter Medications as of 02/28/2021  Medication Sig  . albuterol (VENTOLIN HFA) 108 (90 Base) MCG/ACT inhaler TAKE 2 PUFFS BY MOUTH EVERY  6 HOURS AS NEEDED FOR WHEEZE OR SHORTNESS OF BREATH  . ALPRAZolam (XANAX) 1 MG tablet Take 1 tablet (1 mg total) by mouth 3 (three) times daily.  .[name] KitchenamLODipine (NORVASC) 10 MG tablet TAKE 1 TABLET BY MOUTH DAILY.  .[name] Kitchenatorvastatin (LIPITOR) 80 MG tablet Take 1 tablet (80 mg total) by mouth daily.  . clopidogrel (PLAVIX) 75 MG tablet Take 1 tablet (75 mg total) by mouth daily with breakfast.  . dapagliflozin propanediol (FARXIGA) 10 MG TABS tablet Take 10 mg by mouth daily.  .[name] Kitchenezetimibe (ZETIA) 10 MG tablet Take 1 tablet (10 mg total) by mouth daily. For cholesterol  . famotidine (PEPCID) 20 MG tablet famotidine 20 mg tablet  TAKE 1 TABLET BY MOUTH AT BEDTIME  . fluticasone furoate-vilanterol (BREO ELLIPTA) 100-25 MCG/INH AEPB Inhale 1 puff into the lungs daily.  . furosemide (LASIX) 20 MG tablet Take 1 tablet (20 mg total) by mouth daily.  .[name] KitchenglipiZIDE (GLUCOTROL) 5 MG tablet Take 1 tablet (5 mg total) by mouth daily before breakfast.  . loratadine (CLARITIN) 10 MG tablet Take 1 tablet (10 mg total) by mouth daily.  . nitroGLYCERIN (NITROSTAT) 0.4 MG SL tablet Place 1 tablet (0.4 mg total) under the tongue every 5 (five) minutes as needed for chest pain.  .[name] Kitchenofloxacin (FLOXIN) 0.3 % OTIC solution ofloxacin 0.3 % ear drops  INSTILL 5 DROPS IN EACH EAR TWICE A DAY AS NEEDED FOR EAR PAIN  . QUEtiapine (SEROQUEL) 25 MG tablet Take 1 tablet (25 mg total) by mouth at bedtime.  . Semaglutide,0.25 or 0.5MG/DOS, (OZEMPIC, 0.25 OR 0.5 MG/DOSE,) 2 MG/1.5ML SOPN Inject 0.5 mg into the skin once  a week. On Wednesday  . solifenacin (VESICARE) 10 MG tablet Take 1 tablet (10 mg total) by mouth daily.  . Accu-Chek Softclix Lancets lancets Test BS up to 4 times daily Dx E11.69 (Patient not taking: Reported on 02/28/2021)  . aspirin 325 MG tablet Take 325 mg by mouth daily. (Patient not taking: Reported on 02/28/2021)  . Blood Glucose Monitoring Suppl (ACCU-CHEK GUIDE) w/Device KIT Test Bs QID Dx E11.69 (Patient not  taking: Reported on 02/28/2021)  . glucose blood (ACCU-CHEK GUIDE) test strip Test BS up to 4 times daily Dx E11.69 (Patient not taking: Reported on 02/28/2021)  . metoprolol tartrate (LOPRESSOR) 50 MG tablet Take 2 hours prior to Cardiac CT (Patient not taking: Reported on 02/28/2021)  . triamcinolone (KENALOG) 0.1 % Apply 1 application topically 3 (three) times daily. (Patient not taking: Reported on 02/28/2021)   No facility-administered encounter medications on file as of 02/28/2021.    Allergies (verified) Penicillins, Gabapentin, Lisinopril, and Soap   History: Past Medical History:  Diagnosis Date  . Anxiety   . Arthritis    left hand  . Asthma    daily and prn inhalers  . Chronic cough   . Chronic otitis media 12/2017  . Full dentures   . GERD (gastroesophageal reflux disease)   . History of stroke 09/2017   weakness right hand, numbness right side face  . Hyperlipidemia   . Hypertension    states under control with meds., has been on med. x a long time, per pt.  . Non-insulin dependent type 2 diabetes mellitus ([place])   . Overactive bladder   . Recurrent acute suppurative otitis media without spontaneous rupture of left tympanic membrane 02/19/2018  . Transient cerebral ischemia    Past Surgical History:  Procedure Laterality Date  . ABDOMINAL HYSTERECTOMY     complete  . CATARACT EXTRACTION W/ INTRAOCULAR LENS IMPLANT Right   . CHOLECYSTECTOMY    . COLONOSCOPY N/A 08/17/2015   Procedure: COLONOSCOPY;  Surgeon: Rogene Houston, MD;  [facility];  Service: Endoscopy;  Laterality: N/A;  200 - moved to 7:30 - Ann notified pt  . GAS INSERTION Right    x 2 - eye  . LACRIMAL DUCT EXPLORATION Bilateral    removal of tear ducts  . MYRINGOTOMY WITH TUBE PLACEMENT Bilateral 01/28/2018   Procedure: BILATERAL MYRINGOTOMY WITH TUBE PLACEMENT;  Surgeon: Leta Baptist, MD;  Location: [place];  Service: ENT;  Laterality: Bilateral;  . VITRECTOMY Left 09/14/2015    Family History  Problem Relation Age of Onset  . Arthritis Mother   . Diabetes Mother   . CVA Mother   . Stroke Mother   . Diabetes Father   . Heart disease Father        CABG.  Does not know age of onset  . Asthma Father   . Hepatitis C Sister   . Arthritis Brother   . Cancer Brother        metastic cancer  . Peripheral Artery Disease Daughter   . Arthritis-Osteo Son    Social History   Socioeconomic History  . Marital status: Married    Spouse name: Not on file  . Number of children: 3  . Years of education: Not on file  . Highest education level: GED or equivalent  Occupational History  . Occupation: retired    Comment: Customer service manager and restaurants  Tobacco Use  . Smoking status: Never Smoker  . Smokeless tobacco: Never Used  Vaping  Use  . Vaping Use: Never used  Substance and Sexual Activity  . Alcohol use: No  . Drug use: No  . Sexual activity: Yes  Other Topics Concern  . Not on file  Social History Narrative   Lives at home home with husband with son, daughter in law and 3 children.  [name], his girlfriend and her child also moved in 10/2018      Disabled.   Social Determinants of Health   Financial Resource Strain: Low Risk   . Difficulty of Paying Living Expenses: Not hard at all  Food Insecurity: No Food Insecurity  . Worried About Charity fundraiser in the Last Year: Never true  . Ran Out of Food in the Last Year: Never true  Transportation Needs: No Transportation Needs  . Lack of Transportation (Medical): No  . Lack of Transportation (Non-Medical): No  Physical Activity: Sufficiently Active  . Days of Exercise per Week: 7 days  . Minutes of Exercise per Session: 50 min  Stress: Stress Concern Present  . Feeling of Stress : Very much  Social Connections: Moderately Isolated  . Frequency of Communication with Friends and Family: More than three times a week  . Frequency of Social Gatherings with Friends and Family: More than three  times a week  . Attends Religious Services: Never  . Active Member of Clubs or Organizations: No  . Attends Archivist Meetings: Never  . Marital Status: Married    Tobacco Counseling Counseling given: Not Answered   Clinical Intake:  Pre-visit preparation completed: Yes  Pain : 0-10 Pain Score: 8  Pain Type: Chronic pain Pain Location: Generalized Pain Descriptors / Indicators: Aching Pain Onset: More than a month ago Pain Frequency: Intermittent     BMI - recorded: 32.19 Nutritional Status: BMI > 30  Obese Nutritional Risks: None Diabetes: Yes CBG done?: No Did pt. bring in CBG monitor from home?: No  How often do you need to have someone help you when you read instructions, pamphlets, or other written materials from your doctor or pharmacy?: 1 - Never  Nutrition Risk Assessment:  Has the patient had any N/V/D within the last 2 months?  No  Does the patient have any non-healing wounds?  No  Has the patient had any unintentional weight loss or weight gain?  No   Diabetes:  Is the patient diabetic?  Yes  If diabetic, was a CBG obtained today?  No  Did the patient bring in their glucometer from home?  No  How often do you monitor your CBG's? TID.   Financial Strains and Diabetes Management:  Are you having any financial strains with the device, your supplies or your medication? No .  Does the patient want to be seen by Chronic Care Management for management of their diabetes?  No  Would the patient like to be referred to a Nutritionist or for Diabetic Management?  No   Diabetic Exams:  Diabetic Eye Exam: Completed 2021. She doesn't remember the date, but says she goes every year  Diabetic Foot Exam: Completed 03/01/2020. Pt has been advised about the importance in completing this exam. Pt is scheduled for diabetic foot exam on 05/04/2021.    Interpreter Needed?: No  Information entered by ::  , LPN   Activities of Daily Living In your  present state of health, do you have any difficulty performing the following activities: 02/28/2021  Hearing? N  Vision? N  Difficulty concentrating or making decisions? N  Walking or climbing stairs? N  Dressing or bathing? N  Doing errands, shopping? N  Preparing Food and eating ? N  Using the Toilet? N  In the past six months, have you accidently leaked urine? N  Do you have problems with loss of bowel control? N  Managing your Medications? N  Managing your Finances? N  Housekeeping or managing your Housekeeping? N  Some recent data might be hidden    Patient Care Team: [name], MD as PCP - General (Family Medicine) Vaughan Basta, Rona Ravens, NP (Inactive) as Nurse Practitioner (Internal Medicine) Pieter Partridge, DO as Consulting Physician (Neurology) Cleon Gustin, MD (Anesthesiology) Katha Cabal, [name] as Social Worker (Licensed Clinical Social Worker) Ilean China, RN as Case Manager [place], Royce Macadamia, [facility] (Pharmacist) Werner Lean, MD as Consulting Physician (Cardiology)  Indicate any recent [place] you may have received from other than [facility] providers in the past year (date may be approximate).     Assessment:   This is a routine wellness examination for Lynann.  Hearing/Vision screen  Hearing Screening   125Hz 250Hz 500Hz 1000Hz 2000Hz 3000Hz 4000Hz 6000Hz 8000Hz  Right ear:           Left ear:           Comments: Has seen ENT - was told her hearing is excellent  Vision Screening Comments: Annual visits with [facility] in [place] - up to date with eye exam  Dietary issues and exercise activities discussed: Current Exercise Habits: Home exercise routine, Type of exercise: walking, Time (Minutes): 50, Frequency (Times/Week): 7, Weekly Exercise (Minutes/Week): 350, Intensity: Mild, Exercise limited by: orthopedic condition(s)  Goals    .  Client stated: "I want to get help in managing stress and anxiety" (pt-stated)      Current Barriers:   [name] Kitchen Mental Health Concerns  in patient with Chronic Diagnoses of GAD, HTN, Type 2 DM,HLD, CHF . Financial challenges . Spouse of client is ill  Clinical Social Work Clinical Goal(s):  [name] Kitchen Over the next 30 days, Erminia will talk with [name] regarding management of anxiety and stress symptoms of client    Interventions: . Encouraged client to use relaxation techniques of choice to manage anxiety and stress symptoms . Encouraged client to talk regularly with [name] to discuss anxiety and stress symptoms experienced by client . Encouraged client to talk with [name] as needed to discuss nursing needs of client . Encouraged client to talk with her sister as a means of emotional support  Patient Self Care Activities:  . Attends all scheduled provider appointments  . Client speaks daily with her sister for emotional support  .  Client enjoys reading as a way to manage stress   Plan: Client to call [name] as needed to discuss anxiety and stress issues of client [name] to call client in next 3 weeks to discuss relaxation techniques use of client (reading, gardening in flowers) Client to communicate with [name] to discuss client's nursing needs. Client to participate in self care activities of choice Client to communicate with her sister as a means of emotional support. Client to attend scheduled medical appointments  Initial goal documentation    .  Exercise 3 to 4 times per week (30 min per time)      Walking 2.5 miles daily    .  limit sugar      Cut out drinks / beverages with sugar.      .  Manage Cholesterol  Timeframe:  Long-Range Goal Priority:  Medium Start Date:      02/22/21                       Expected End Date:   02/22/22                    Follow-up: 04/13/21  . Follow-up with cardiologist as planned on 04/19/21 . Take Lipitor 80 mg as prescribed . Call cardiologist or PCP with any muscle aches/pains . Plate method diet with 1/2 plate of vegetables, 1/4 lean protein, 1/4  starch . Increase activity level as tolerated with a goal of at least 150 min a week . Call RN Care Manager as needed [phone]    .  [place] Anxiety, Stress and Depression symptoms faced (pt-stated)       Timeframe: Short Term  This Visit's Progress: Not on Track  Priority : Medium  Start Date: 02/07/2021  Expected End Date 05/10/2021  Completed Date  Follow Up Date:  03/10/2021     Needs help with coping skills to manage anxiety, stress or depression symptoms faced :   Patient Self Care Activities:  . Self administers medications as prescribed . Attends all scheduled provider appointments . Performs ADL's independently  Patient Coping Strengths:  . Supportive Relationships . Family . Self Advocate  Patient Self Care Deficits:  . Pain issues . Challenges with Diabetes management  Patient Goals:  - exercise at least 2 to 3 times per week - spend time or talk with others every day - practice relaxation or meditation daily - keep a calendar with appointment dates  Follow Up Plan: [name] to call client on 03/10/2021 to assess client needs     .  Monitor and Manage My Blood Sugar-Diabetes Type 2      Timeframe:  Long-Range Goal Priority:  Medium Start Date:     02/22/21                        Expected End Date: 02/22/22                      Follow Up Date 04/13/21   . Talk with [name] regarding psychosocial issues . Eat at least 3 meals a day . 1/2 plate of vegetables, 1/4 protein, and 1/4 starch . Pair protein, like cheese or nuts, with a carbohydrate, like a piece of fruit, for a snack . Check blood sugar twice daily and record . Call PCP with any readings outside of recommended range . Increase activity level as tolerated with a goal of 150 minutes a week . Call RN Care Manager as needed [phone]    Why is this important?    Checking your blood sugar at home helps to keep it from getting very high or very low.   Writing the results in a diary or log  helps the doctor know how to care for you.   Your blood sugar log should have the time, date and the results.   Also, write down the amount of insulin or other medicine that you take.   Other information, like what you ate, exercise done and how you were feeling, will also be helpful.     Notes:     .  Stress/Anxiety Managed      Timeframe:  Short-Term Goal Priority:  High Start Date:     02/22/21  Expected End Date:   04/24/21                     Follow-up: [name] 03/10/21  . Talk with [name] regarding stress/anxiety over difficult family/home situation      Depression Screen PHQ 2/9 Scores 02/28/2021 11/29/2020 11/02/2020 08/02/2020 05/02/2020 05/02/2020 03/01/2020  PHQ - 2 Score 0 _0 PHQ- 9 Score - _1 - 17    Fall Risk Fall Risk  02/28/2021 02/01/2021 11/29/2020 11/02/2020 08/02/2020  Falls in the past year? 0 0 0 0 0  Number falls in past yr: 0 - 0 - 0  Injury with Fall? 0 - 0 - 0  Risk for fall due to : Orthopedic patient - No Fall Risks - No Fall Risks  Follow up Falls prevention discussed - Falls evaluation completed Falls evaluation completed Falls evaluation completed    [place]:  Any stairs in or around the home? Yes  If so, are there any without handrails? No  Home free of loose throw rugs in walkways, pet beds, electrical cords, etc? Yes  Adequate lighting in your home to reduce risk of falls? Yes   ASSISTIVE DEVICES UTILIZED TO PREVENT FALLS:  Life alert? No  Use of a cane, walker or w/c? No  Grab bars in the bathroom? No  Shower chair or bench in shower? No  Elevated toilet seat or a handicapped toilet? No   TIMED UP AND GO:  Was the test performed? No . Telephonic visit  Cognitive Function: Normal cognitive status assessed by direct observation by this Nurse Health Advisor. No abnormalities found.   MMSE - Mini Mental State Exam 12/02/2018 06/06/2017 07/20/2015  Orientation to time _2 Orientation  to Place _3 Registration _4 Attention/ Calculation _5 Recall _6 Language- name 2 objects _7 Language- repeat _8 Language- follow 3 step command _9 Language- read & follow direction _10 Write a sentence _11 Copy design _12 Total score _13 6CIT Screen 01/18/2020  What Year? 0 points  What month? 0 points  What time? 0 points  Count back from 20 0 points  Months in reverse 0 points  Repeat phrase 0 points  Total Score 0    Immunizations Immunization History  Administered Date(s) Administered  . Influenza Split 08/18/2015  . Influenza,inj,Quad PF,6+ Mos 10/22/2018  . Influenza-Unspecified 09/14/2013  . Moderna Sars-Covid-2 Vaccination 06/14/2020, 07/12/2020  . Pneumococcal Conjugate-13 01/06/2015  . Pneumococcal Polysaccharide-23 10/22/2018  . Tdap 06/06/2017  . Zoster Recombinat (Shingrix) 06/06/2017, 02/26/2018    TDAP status: Up to date  Flu Vaccine status: Declined, Education has been provided regarding the importance of this vaccine but patient still declined. Advised may receive this vaccine at local pharmacy or Health Dept. Aware to provide a copy of the vaccination record if obtained from local pharmacy or Health Dept. Verbalized acceptance and understanding.  Pneumococcal vaccine status: Up to date  Covid-19 vaccine status: Completed vaccines  Qualifies for Shingles Vaccine? Yes   Zostavax completed No   Shingrix Completed?: Yes  Screening Tests Health Maintenance  Topic Date Due  . OPHTHALMOLOGY EXAM  08/26/2016  . COLON CANCER SCREENING ANNUAL FOBT  12/04/2019  . COVID-19 Vaccine (3 - Moderna risk  4-dose series) 08/09/2020  . DEXA SCAN  12/05/2020  . URINE MICROALBUMIN  03/01/2021  . FOOT EXAM  03/01/2021  . MAMMOGRAM  05/18/2021  . INFLUENZA VACCINE  06/26/2021  . HEMOGLOBIN A1C  08/04/2021  . COLONOSCOPY (Pts 45-65yr Insurance coverage will need to be confirmed)  08/16/2022  . TETANUS/TDAP  06/07/2027   . Hepatitis C Screening  Completed  . PNA vac Low Risk Adult  Completed  . HPV VACCINES  Aged Out    Health Maintenance  Health Maintenance Due  Topic Date Due  . OPHTHALMOLOGY EXAM  08/26/2016  . COLON CANCER SCREENING ANNUAL FOBT  12/04/2019  . COVID-19 Vaccine (3 - Moderna risk 4-dose series) 08/09/2020  . DEXA SCAN  12/05/2020  . URINE MICROALBUMIN  03/01/2021    Colorectal cancer screening: Type of screening: Colonoscopy. Completed 08/17/2015. Repeat every 7 years  Mammogram status: Completed 05/18/2020. Repeat every year  Bone Density status: Ordered 02/28/21. Pt provided with contact info and advised to call to schedule appt.  Lung Cancer Screening: (Low Dose CT Chest recommended if Age 71-80years, 30 pack-year currently smoking OR have quit w/in 15years.) does not qualify.   Additional Screening:  Hepatitis C Screening: does qualify; Completed 07/20/2015  Vision Screening: Recommended annual ophthalmology exams for early detection of glaucoma and other disorders of the eye. Is the patient up to date with their annual eye exam?  Yes  Who is the provider or what is the name of the office in which the patient attends annual [place] exams[place] [facility] [place] not [place] a provider, would they like to be referred to a provider to establish care? No .   Dental Screening: Recommended annual dental exams for proper oral hygiene  Community Resource Referral / Chronic Care Management: CRR required this visit?  No   CCM required this visit?  No      Plan:     I have personally reviewed and noted the following in the patient's chart:   . Medical and social history . Use of alcohol, tobacco or illicit drugs  . Current medications and supplements . Functional ability and status . Nutritional status . Physical activity . Advanced directives . List of other physicians . Hospitalizations, surgeries, and ER visits in previous 12  months . Vitals . Screenings to include cognitive, depression, and falls . Referrals and appointments  In addition, I have reviewed and discussed with patient certain preventive protocols, quality metrics, and best practice recommendations. A written personalized care plan for preventive services as well as general preventive health recommendations were provided to patient.     ASandrea Hammond LPN   [id number]  Nurse Notes: Patient says her glucose monitor is broken and she needs [facility] [place] Accu-chek TID.

## 2021-02-28 NOTE — Addendum Note (Signed)
Addended by: Adalberto Cole E on: 02/28/2021 11:48 AM   Modules accepted: Orders

## 2021-03-06 ENCOUNTER — Other Ambulatory Visit (HOSPITAL_COMMUNITY): Payer: Self-pay | Admitting: Family Medicine

## 2021-03-06 DIAGNOSIS — Z1231 Encounter for screening mammogram for malignant neoplasm of breast: Secondary | ICD-10-CM

## 2021-03-08 ENCOUNTER — Ambulatory Visit (INDEPENDENT_AMBULATORY_CARE_PROVIDER_SITE_OTHER): Payer: Medicare HMO

## 2021-03-08 ENCOUNTER — Other Ambulatory Visit: Payer: Self-pay

## 2021-03-08 DIAGNOSIS — M8589 Other specified disorders of bone density and structure, multiple sites: Secondary | ICD-10-CM | POA: Diagnosis not present

## 2021-03-08 DIAGNOSIS — Z780 Asymptomatic menopausal state: Secondary | ICD-10-CM

## 2021-03-10 ENCOUNTER — Telehealth: Payer: Medicare HMO

## 2021-03-13 ENCOUNTER — Telehealth: Payer: Medicare HMO

## 2021-03-13 NOTE — Progress Notes (Signed)
DEXA shows osteopenia. I recommend weekly fosamax. ?Nurse, if pt. Is agreeable, send in Fosamax 70 mg weekly, #13. ? ?Thanks, ?WS ?

## 2021-03-15 ENCOUNTER — Other Ambulatory Visit: Payer: Self-pay | Admitting: *Deleted

## 2021-03-15 MED ORDER — ALENDRONATE SODIUM 70 MG PO TABS: 70.0000 mg | ORAL_TABLET | ORAL | 3 refills | Status: DC

## 2021-03-18 DIAGNOSIS — H2513 Age-related nuclear cataract, bilateral: Secondary | ICD-10-CM | POA: Diagnosis not present

## 2021-03-18 DIAGNOSIS — H40033 Anatomical narrow angle, bilateral: Secondary | ICD-10-CM | POA: Diagnosis not present

## 2021-03-20 DIAGNOSIS — G43009 Migraine without aura, not intractable, without status migrainosus: Secondary | ICD-10-CM | POA: Diagnosis not present

## 2021-03-22 ENCOUNTER — Encounter: Payer: Self-pay | Admitting: *Deleted

## 2021-03-25 ENCOUNTER — Other Ambulatory Visit: Payer: Self-pay | Admitting: Family Medicine

## 2021-04-12 NOTE — Progress Notes (Deleted)
Cardiology Office Note    Date:  04/12/2021   ID:  Yvonne [name], DOB Feb 09, 1951, MRN [id number]   PCP:  [name], [place]  Cardiologist:  None *** Advanced Practice Provider:  No care team member to display Electrophysiologist:  None   [id number]}   No chief complaint on file.   History of Present Illness:  Yvonne [name] is a 70 y.o. female with a hx of HTN with DM orthostatic hypotension, HFpEF, Chronic Migraine, GERD, HLD with DM and Aortic atherosclerosis, stoke with prior lacunar infarcts 01/03/21, CKD IIIa.  Patient saw Dr. Gasper Sells 12/2020 with chest pain and recommended coronary CTA calcium score 736 with moderate stenosis in the LAD but FFR was negative for stenosis.  Also has history of orthostatic hypotension but was asymptomatic at office visit   Past Medical History:  Diagnosis Date  . Anxiety   . Arthritis    left hand  . Asthma    daily and prn inhalers  . Chronic cough   . Chronic otitis media 12/2017  . Full dentures   . GERD (gastroesophageal reflux disease)   . History of stroke 09/2017   weakness right hand, numbness right side face  . Hyperlipidemia   . Hypertension    states under control with meds., has been on med. x a long time, per pt.  . Non-insulin dependent type 2 diabetes mellitus ([place])   . Overactive bladder   . Recurrent acute suppurative otitis media without spontaneous rupture of left tympanic membrane 02/19/2018  . Transient cerebral ischemia     Past Surgical History:  Procedure Laterality Date  . ABDOMINAL HYSTERECTOMY     complete  . CATARACT EXTRACTION W/ INTRAOCULAR LENS IMPLANT Right   . CHOLECYSTECTOMY    . COLONOSCOPY N/A 08/17/2015   Procedure: COLONOSCOPY;  Surgeon: Rogene Houston, MD;  [facility];  Service: Endoscopy;  Laterality: N/A;  200 - moved to 7:30 - Ann notified pt  . GAS INSERTION Right    x 2 - eye  . LACRIMAL DUCT EXPLORATION Bilateral    removal of  tear ducts  . MYRINGOTOMY WITH TUBE PLACEMENT Bilateral 01/28/2018   Procedure: BILATERAL MYRINGOTOMY WITH TUBE PLACEMENT;  Surgeon: Leta Baptist, MD;  Location: [place];  Service: ENT;  Laterality: Bilateral;  . VITRECTOMY Left 09/14/2015    Current Medications: No outpatient medications have been marked as taking for the 04/19/21 encounter (Appointment) with Imogene Burn, PA-C.     Allergies:   Penicillins, Gabapentin, Lisinopril, and Soap   Social History   Socioeconomic History  . Marital status: Married    Spouse name: Not on file  . Number of children: 3  . Years of education: Not on file  . Highest education level: GED or equivalent  Occupational History  . Occupation: retired    Comment: Customer service manager and restaurants  Tobacco Use  . Smoking status: Never Smoker  . Smokeless tobacco: Never Used  Vaping Use  . Vaping Use: Never used  Substance and Sexual Activity  . Alcohol use: No  . Drug use: No  . Sexual activity: Yes  Other Topics Concern  . Not on file  Social History Narrative   Lives at home home with husband with son, daughter in law and 3 children.  [name], his girlfriend and her child also moved in 10/2018      Disabled.   Social Determinants of Health  Financial Resource Strain: Low Risk   . Difficulty of Paying Living Expenses: Not hard at all  Food Insecurity: No Food Insecurity  . Worried About Charity fundraiser in the Last Year: Never true  . Ran Out of Food in the Last Year: Never true  Transportation Needs: No Transportation Needs  . Lack of Transportation (Medical): No  . Lack of Transportation (Non-Medical): No  Physical Activity: Sufficiently Active  . Days of Exercise per Week: 7 days  . Minutes of Exercise per Session: 50 min  Stress: Stress Concern Present  . Feeling of Stress : Very much  Social Connections: Moderately Isolated  . Frequency of Communication with Friends and Family: More than three times a  week  . Frequency of Social Gatherings with Friends and Family: More than three times a week  . Attends Religious Services: Never  . Active Member of Clubs or Organizations: No  . Attends Archivist Meetings: Never  . Marital Status: Married     Family History:  The patient's ***family history includes Arthritis in her brother and mother; Arthritis-Osteo in her son; Asthma in her father; CVA in her mother; Cancer in her brother; Diabetes in her father and mother; Heart disease in her father; Hepatitis C in her sister; Peripheral Artery Disease in her daughter; Stroke in her mother.   ROS:   Please see the history of present illness.    ROS All other systems reviewed and are negative.   PHYSICAL EXAM:   VS:  There were no vitals taken for this visit.  Physical Exam  GEN: Well nourished, well developed, in no acute distress  HEENT: normal  Neck: no JVD, carotid bruits, or masses Cardiac:RRR; no murmurs, rubs, or gallops  Respiratory:  clear to auscultation bilaterally, normal work of breathing GI: soft, nontender, nondistended, + BS Ext: without cyanosis, clubbing, or edema, Good distal pulses bilaterally MS: no deformity or atrophy  Skin: warm and dry, no rash Neuro:  Alert and Oriented x 3, Strength and sensation are intact Psych: euthymic mood, full affect  Wt Readings from Last 3 Encounters:  02/28/21 176 lb (79.8 kg)  02/01/21 179 lb 12.8 oz (81.6 kg)  01/20/21 180 lb 12.8 oz (82 kg)      Studies/Labs Reviewed:   EKG:  EKG is*** ordered today.  The ekg ordered today demonstrates ***  Recent Labs: 02/01/2021: ALT 31; Hemoglobin 13.7; Platelets 259 02/06/2021: BUN 20; Creatinine, Ser 1.02; Potassium 3.8; Sodium 140   Lipid Panel    Component Value Date/Time   CHOL 140 02/01/2021 0946   TRIG 110 02/01/2021 0946   HDL 50 02/01/2021 0946   CHOLHDL 2.8 02/01/2021 0946   CHOLHDL 5.7 06/11/2019 0622   VLDL 39 06/11/2019 0622   LDLCALC 70 02/01/2021 0946     Additional studies/ records that were reviewed today include:  Coronary CTA and FFR 01/2021 Coronary calcium score of 736. This was 94th percentile for age, sex, and race matched control.   RCA is a large dominant artery that gives rise to PDA and PLA. There is a mild non-obstructive (25-49%) calcified plaque in the ostium of the vessel. There are mild non-obstructive (25-49%) mixed plaques scattered in the proximal and mid vessel. There are two minimal non-obstructive (1-24%) calcified plaques in the mid and distal vessel.   Left main is a large artery that gives rise to LAD and LCX arteries. There is a minimal (< 10%) ostial calcified plaque.   LAD is a large  vessel that gives rise to one large D1 Branch that bifurcates and a secondary D2 vessel. There is a mild non-obstructive (25-49%) calcified plaque in the proximal vessel. There is a moderate stenosis (50-70%) mixed and calcified plaque proximal-mid vessel- there is a napkin ring's sign. There is a mild non-obstructive (25-49%) calcified plaque in the mid vessel after the moderate stenosis. There is a minimal non-obstructive (1-24%) calcified plaque in the D2 vessel.   LCX is a non-dominant artery that gives rise to one small OM1 vessel. There is no plaque.   Other findings:   Normal pulmonary vein drainage into the left atrium.   Normal left atrial appendage without a thrombus.   Normal size of the pulmonary artery.   Extra-cardiac findings: See attached radiology report for non-cardiac structures.   IMPRESSION: 1. Coronary calcium score of 736. This was 94th percentile for age, sex, and race matched control.   2. Normal coronary origin.  Right dominance.   3. CAD-RADS 3. Moderate stenosis. Consider symptom-guided anti-ischemic pharmacotherapy as well as risk factor modification per guideline directed care. Additional analysis with CT FFR will be submitted.   4.  Aortic atherosclerosis noted.      Electronically Signed   By: Rudean Haskell MD   On: 02/09/2021 15:44 FINDINGS: CT-FFR analysis was performed on the original cardiac CT angiogram dataset. Diagrammatic representation of the CT-FFR analysis is provided in a separate PDF document in PACS. This dictation was created using the PDF document and an interactive 3D model of the results. 3D model is not available in the EMR/PACS. Normal FFR range is >0.80.   1. Left Main: No significant functional stenosis, CT-FFR 0.99.   2. LAD: No significant functional stenosis, CT-FFR 0.87 distal LAD, except for distal tip CT-FFR 0.79. 3. LCX: No significant functional stenosis, CT-FFR 0.98 distal vessel. 4. RCA: No significant functional stenosis, CT-FFR 0.91 distal vessel.   IMPRESSION: 1. CT FFR analysis shows evidence of significant functional stenosis only at the distal tip of the LAD.   Rudean Haskell MD     Monitor 01/2021   Patient had a minimum heart rate of 53 bpm, maximum heart rate of 160 bpm, and average heart rate of 73 bpm.  Predominant underlying rhythm was sinus rhythm.  Six runs of suprventricular tachycardia occurred lasting 7 beats at longest with a max rate of 160 bpm at fastest.  Isolated PACs were rare (<1.0%), with rare couplets and triplets present.  Isolated PVCs were rare (<1.0%).  No evidence of complete heart block.  Triggered and diary events associated with sinus rhythm.      Risk Assessment/Calculations:   {Does this patient have ATRIAL FIBRILLATION?:[phone]}     ASSESSMENT:    No diagnosis found.   PLAN:  In order of problems listed above:  Chest pain coronary CTA 01/2021 calcium score 736 moderate disease in the LAD but FFR no significant stenosis.  Medical management  History of CVA  History of orthostatic hypotension  Palpitations monitor 01/2021 6 runs of SVT longest 7 beats.  No A. Fib  Hypertension  HLD  Shared Decision Making/Informed Consent    {Are you ordering a CV Procedure (e.g. stress test, cath, DCCV, TEE, etc)?   Press F2        :[id number]}    Medication Adjustments/Labs and Tests Ordered: Current medicines are reviewed at length with the patient today.  Concerns regarding medicines are outlined above.  Medication changes, Labs and Tests ordered today are listed in the Patient  Instructions below. There are no Patient Instructions on file for this visit.   Signed, Ermalinda Barrios, PA-C  04/12/2021 3:16 PM    [facility] [place], [place], [place]  [address] Phone: [phone]; Fax: [phone]

## 2021-04-13 ENCOUNTER — Other Ambulatory Visit: Payer: Self-pay | Admitting: Family Medicine

## 2021-04-13 ENCOUNTER — Ambulatory Visit (INDEPENDENT_AMBULATORY_CARE_PROVIDER_SITE_OTHER): Payer: Medicare HMO | Admitting: *Deleted

## 2021-04-13 ENCOUNTER — Telehealth: Payer: Self-pay | Admitting: *Deleted

## 2021-04-13 DIAGNOSIS — E785 Hyperlipidemia, unspecified: Secondary | ICD-10-CM

## 2021-04-13 DIAGNOSIS — I10 Essential (primary) hypertension: Secondary | ICD-10-CM

## 2021-04-13 DIAGNOSIS — N1831 Chronic kidney disease, stage 3a: Secondary | ICD-10-CM

## 2021-04-13 DIAGNOSIS — E1122 Type 2 diabetes mellitus with diabetic chronic kidney disease: Secondary | ICD-10-CM

## 2021-04-13 DIAGNOSIS — F1320 Sedative, hypnotic or anxiolytic dependence, uncomplicated: Secondary | ICD-10-CM

## 2021-04-13 DIAGNOSIS — F411 Generalized anxiety disorder: Secondary | ICD-10-CM

## 2021-04-13 MED ORDER — ALPRAZOLAM 1 MG PO TABS: 1.0000 mg | ORAL_TABLET | Freq: Three times a day (TID) | ORAL | 0 refills | Status: DC

## 2021-04-13 NOTE — Patient Instructions (Signed)
Visit Information  PATIENT GOALS: Goals Addressed            This Visit's Progress   . Manage Cholesterol   On track    Timeframe:  Long-Range Goal Priority:  Medium Start Date:      02/22/21                       Expected End Date:   02/22/22                    Follow-up: 05/25/21  . Follow-up with cardiologist as planned on 04/19/21 . Take Lipitor 80 mg as prescribed . Call cardiologist or PCP with any muscle aches/pains . ADA/Carb modified diet or Plate method diet with 1/2 plate of vegetables, 1/4 lean protein, 1/4 starch . Increase activity level as tolerated with a goal of at least 150 min a week . Call RN Care Manager as needed [phone]    . Monitor and Manage My Blood Sugar-Diabetes Type 2   On track    Timeframe:  Long-Range Goal Priority:  Medium Start Date:     02/22/21                        Expected End Date: 02/22/22                      Follow Up Date 04/13/21   . Talk with [name] regarding psychosocial issues . Eat at least 3 meals a day . 1/2 plate of vegetables, 1/4 protein, and 1/4 starch . Pair protein, like cheese or nuts, with a carbohydrate, like a piece of fruit, for a snack . Check blood sugar twice daily and record . Call PCP with any readings outside of recommended range . Increase activity level as tolerated with a goal of 150 minutes a week . Call RN Care Manager as needed [phone]    Why is this important?    Checking your blood sugar at home helps to keep it from getting very high or very low.   Writing the results in a diary or log helps the doctor know how to care for you.   Your blood sugar log should have the time, date and the results.   Also, write down the amount of insulin or other medicine that you take.   Other information, like what you ate, exercise done and how you were feeling, will also be helpful.     Notes:     . Track and Manage My Blood Pressure-Hypertension   On track    Timeframe:  Long-Range Goal Priority:   Medium Start Date:   04/13/21                          Expected End Date:  04/13/22                      Follow Up Date 05/25/21   . Patient will self administer medications as prescribed . Patient will attend all scheduled provider appointments . Increase activity level as tolerated with a goal of at least 150 minutes per week . Continue to check and record blood pressure at least 3 times per week . Call PCP with any readings outside of recommended range . Call RN Care Manager as needed [phone]    Why is this important?    You won't feel high blood  pressure, but it can still hurt your blood vessels.   High blood pressure can cause heart or kidney problems. It can also cause a stroke.   Making lifestyle changes like losing a little weight or eating less salt will help.   Checking your blood pressure at home and at different times of the day can help to control blood pressure.   If the doctor prescribes medicine remember to take it the way the doctor ordered.   Call the office if you cannot afford the medicine or if there are questions about it.     Notes:        The patient verbalized understanding of instructions, educational materials, and care plan provided today and declined offer to receive copy of patient instructions, educational materials, and care plan.   Follow-up Plan . Telephone follow up appointment with care management team member scheduled for: [name] 04/18/21, [facility] 05/25/21 . The patient has been provided with contact information for the care management team and has been advised to call with any health related questions or concerns.  . Next PCP appointment scheduled for: 05/04/21 with Dr [name] . Next cardiology appointment scheduled for: 04/19/21  [name], BSN, RN-BC [place] [facility] [place] [facility] Direct Dial: [phone]

## 2021-04-13 NOTE — Telephone Encounter (Signed)
Please let the patient know that I sent their prescription to their pharmacy. Thanks, WS 

## 2021-04-13 NOTE — Telephone Encounter (Signed)
Patient aware.

## 2021-04-13 NOTE — Telephone Encounter (Signed)
04/13/2021  Yvonne [name] is scheduled for an appointment with Dr [name] on 05/04/21 but her alprazolam will run out on 04/29/21. Dr [name] is out of the office the week before the 4th.  Forwarding to Dr [name] for review and response. Patient advised that [facility] clinical staff will reach out to her with response.   [name], BSN, RN-BC Embedded Chronic Care Manager [facility] [place] [facility] Direct Dial: [phone]

## 2021-04-13 NOTE — Chronic Care Management (AMB) (Signed)
Chronic Care Management   CCM RN Visit Note  04/13/2021 Name: [name] MRN: [id number] DOB: Mar 15, 1951  Subjective: Y[name] is a 70 y.o. year old female who is a primary care patient of [name], [name], MD. The care management team was consulted for assistance with disease management and care coordination needs.    Engaged with patient by telephone for follow up visit in response to provider referral for case management and/or care coordination services.   Consent to Services:  The patient was given information about Chronic Care Management services, agreed to services, and gave verbal consent prior to initiation of services.  Please see initial visit note for detailed documentation.   Patient agreed to services and verbal consent obtained.   Assessment: Review of patient past medical history, allergies, medications, health status, including review of consultants reports, laboratory and other test data, was performed as part of comprehensive evaluation and provision of chronic care management services.   SDOH (Social Determinants of Health) assessments and interventions performed:    CCM Care Plan  Allergies  Allergen Reactions  . Penicillins Hives and Itching    Has patient had a PCN reaction causing immediate rash, facial/tongue/throat swelling, SOB or lightheadedness with hypotension: Yes Has patient had a PCN reaction causing severe rash involving mucus membranes or skin necrosis: Unk Has patient had a PCN reaction that required hospitalization: No Has patient had a PCN reaction occurring within the last 10 years: No If all of the above answers are "NO", then may proceed with Cephalosporin use.  Has patient had a PCN reaction causing immediate rash, facial/tongue/throat swelling, SOB or lightheadedness with hypotension: Yes Has patient had a PCN reaction causing severe rash involving mucus membranes or skin necrosis: No Has patient had a PCN reaction that required  hospitalization No Has patient had a PCN reaction occurring within the last 10 years: No If all of the above answers are "NO", then may proceed with Cephalosporin use. Has patient had a PCN reaction causing immediate rash, facial/tongue/throat swelling, SOB or lightheadedness with hypotension: Yes Has patient had a PCN reaction causing severe rash involving mucus membranes or skin necrosis: Unk Has patient had a PCN reaction that required hospitalization: No Has patient had a PCN reaction occurring within the last 10 years: No If all of the above answers are "NO", then may proceed with Cephalosporin use. Has patient had a PCN reaction causing immediate rash, facial/tongue/throat swelling, SOB or lightheadedness with hypotension: Yes Has patient had a PCN reaction causing severe rash involving mucus membranes or skin necrosis: No Has patient had a PCN reaction that required hospitalization No Has patient had a PCN reaction occurring within the last 10 years: No If all of the above answers are "NO", then may proceed with Cephalosporin use. Has patient had a PCN reaction causing immediate rash, facial/tongue/throat swelling, SOB or lightheadedness with hypotension: Yes Has patient had a PCN reaction causing sev... (TRUNCATED)  . Gabapentin Other (See Comments)    Causes a lot of lethargy at a higher dose Causes a lot of lethargy at a higher dose Causes a lot of lethargy at a higher dose  . Lisinopril Cough    HAS A CHRONIC COUGH AND DID NOT NOTICE IMPROVEMENT OF COUGH WHEN LISINOPRIL WAS STOPPED HAS A CHRONIC COUGH AND DID NOT NOTICE IMPROVEMENT OF COUGH WHEN LISINOPRIL WAS STOPPED HAS A CHRONIC COUGH AND DID NOT NOTICE IMPROVEMENT OF COUGH WHEN LISINOPRIL WAS STOPPED  . Soap Rash    DIAL soap  Outpatient Encounter Medications as of 04/13/2021  Medication Sig  . Accu-Chek Softclix Lancets lancets Test BS up to 4 times daily Dx E11.69 (Patient not taking: Reported on 02/28/2021)  . albuterol  (VENTOLIN HFA) 108 (90 Base) MCG/ACT inhaler TAKE 2 PUFFS BY MOUTH EVERY 6 HOURS AS NEEDED FOR WHEEZE OR SHORTNESS OF BREATH  . alendronate (FOSAMAX) 70 MG tablet Take 1 tablet (70 mg total) by mouth every 7 (seven) days. Take with a full glass of water on an empty stomach.  . ALPRAZolam (XANAX) 1 MG tablet Take 1 tablet (1 mg total) by mouth 3 (three) times daily.  [name] Kitchen amLODipine (NORVASC) 10 MG tablet TAKE 1 TABLET BY MOUTH DAILY.  [name] Kitchen aspirin 325 MG tablet Take 325 mg by mouth daily. (Patient not taking: Reported on 02/28/2021)  . atorvastatin (LIPITOR) 80 MG tablet Take 1 tablet (80 mg total) by mouth daily.  . Blood Glucose Monitoring Suppl (ACCU-CHEK GUIDE) w/Device KIT Test Bs QID Dx E11.69  . clopidogrel (PLAVIX) 75 MG tablet Take 1 tablet (75 mg total) by mouth daily with breakfast.  . dapagliflozin propanediol (FARXIGA) 10 MG TABS tablet Take 10 mg by mouth daily.  [name] Kitchen ezetimibe (ZETIA) 10 MG tablet Take 1 tablet (10 mg total) by mouth daily. For cholesterol  . famotidine (PEPCID) 20 MG tablet famotidine 20 mg tablet  TAKE 1 TABLET BY MOUTH AT BEDTIME  . fluticasone furoate-vilanterol (BREO ELLIPTA) 100-25 MCG/INH AEPB Inhale 1 puff into the lungs daily.  . furosemide (LASIX) 20 MG tablet Take 1 tablet (20 mg total) by mouth daily.  [name] Kitchen glipiZIDE (GLUCOTROL) 5 MG tablet Take 1 tablet (5 mg total) by mouth daily before breakfast.  . glucose blood (ACCU-CHEK GUIDE) test strip TEST BLOOD SUGAR 4 TIMES DAILY Dx E11.69  . loratadine (CLARITIN) 10 MG tablet Take 1 tablet (10 mg total) by mouth daily.  . metoprolol tartrate (LOPRESSOR) 50 MG tablet Take 2 hours prior to Cardiac CT (Patient not taking: Reported on 02/28/2021)  . nitroGLYCERIN (NITROSTAT) 0.4 MG SL tablet Place 1 tablet (0.4 mg total) under the tongue every 5 (five) minutes as needed for chest pain.  [name] Kitchen ofloxacin (FLOXIN) 0.3 % OTIC solution ofloxacin 0.3 % ear drops  INSTILL 5 DROPS IN EACH EAR TWICE A DAY AS NEEDED FOR EAR PAIN  .  QUEtiapine (SEROQUEL) 25 MG tablet Take 1 tablet (25 mg total) by mouth at bedtime.  . Semaglutide,0.25 or 0.5MG/DOS, (OZEMPIC, 0.25 OR 0.5 MG/DOSE,) 2 MG/1.5ML SOPN Inject 0.5 mg into the skin once a week. On Wednesday  . solifenacin (VESICARE) 10 MG tablet Take 1 tablet (10 mg total) by mouth daily.  [name] Kitchen triamcinolone (KENALOG) 0.1 % Apply 1 application topically 3 (three) times daily. (Patient not taking: Reported on 02/28/2021)   No facility-administered encounter medications on file as of 04/13/2021.    Patient Active Problem List   Diagnosis Date Noted  . Restless legs 02/27/2021  . Insomnia 02/27/2021  . Polyneuropathy due to type 2 diabetes mellitus ([place]) 02/27/2021  . Palpitations 01/20/2021  . Aortic atherosclerosis ([place]) 01/20/2021  . Orthostatic hypotension 01/20/2021  . Carpal tunnel syndrome 08/23/2020  . Diabetic lipidosis ([place]) 09/09/2019  . Hypokalemia 12/11/2018  . Benzodiazepine dependence, continuous ([place]) 10/22/2018  . Chronic migraine without aura without status migrainosus, not intractable 02/19/2018  . Chronic diastolic CHF (congestive heart failure) ([place]) 03/21/2017  . Chronic bilateral low back pain with bilateral sciatica 02/27/2017  . Spondylosis of lumbar region without myelopathy or radiculopathy 08/15/2016  .  Pain of both hip joints 06/18/2016  . Pain syndrome, chronic 06/18/2016  . Chronic cough 05/17/2016  . Hyperlipidemia with target LDL less than 70 11/04/2015  . Macular hole of right eye 09/14/2015  . Obesity 07/20/2015  . Osteopenia 07/20/2015  . Precordial pain 07/06/2015  . Overactive bladder   . Hypertension   . GAD (generalized anxiety disorder)   . Acid reflux disease 08/17/2014    Conditions to be addressed/monitored:HTN, DMII and hyperlipidemia  Care Plan : [name]: Diabetes Type 2 (Adult)  Updates made by Ilean China, RN since 04/13/2021 12:00 AM    Problem: Glycemic Management (Diabetes, Type 2)   Priority: Medium    Long-Range  Goal: Glycemic Management Optimized   Start Date: 02/22/2021  This Visit's Progress: On track  Recent Progress: Not on track  Priority: Medium  Note:   Objective: Lab Results  Component Value Date   HGBA1C 7.8 (H) 02/01/2021   HGBA1C 8.2 (H) 11/02/2020   HGBA1C 7.9 (H) 08/02/2020   Lab Results  Component Value Date   LDLCALC 70 02/01/2021   CREATININE 1.02 (H) 02/06/2021   Current Barriers:  . Chronic Disease Management support and education needs related to diabetes in a patient with HLD and HTN  Nurse Case Manager Clinical Goal(s):  . patient will work with PCP to address needs related to medical management of diabetes . patient will meet with RN Care Manager to address self-management of diabetes . the patient will demonstrate ongoing self health care management ability as evidenced by a steadily improving A1C with a goal of less than 7*  Interventions:  . 1:1 collaboration with [name], MD regarding development and update of comprehensive plan of care as evidenced by provider attestation and co-signature . Inter-disciplinary care team collaboration (see longitudinal plan of care) . Evaluation of current treatment plan related to diabetes and patient's adherence to plan as established by provider. . Chart reviewed including relevant office notes and lab results . Discussed recent office visit and A1C improvement . Reviewed and discussed medications: o Receiving [place] and Ozempic through prescription assistance program o Compliant with medications . Discussed diet o Reinforced need to eat meals at regular intervals and to follow an ADA/carb modified diet or Plate method o Recommended she eat at least 3 meals a day o Always pair a protein with a carbohydrate when eating a snack or a meal . Encouraged increasing physical activity level with a goal of at least 150 min a week . Discussed home blood sugar testing o Testing twice a day o No readings below 70 o No  readings above 200 o Averaging about 140 . Reviewed upcoming appointments with PCP and [name] . Encouraged to talk with [name] regarding psychosocial issues . Provided with [name] contact number and encouraged to reach out as needed  Patient Goals/Self-Care Activities Over the next 60 days, patient will: . Talk with [name] regarding psychosocial issues . Eat at least 3 meals a day . 1/2 plate of vegetables, 1/4 protein, and 1/4 starch . Pair protein, like cheese or nuts, with a carbohydrate, like a piece of fruit, for a snack . Check blood sugar twice daily and record . Call PCP with any readings outside of recommended range . Increase activity level as tolerated with a goal of 150 minutes a week . Call RN Care Manager as needed [phone]    Care Plan : [name]: Wellness  Updates made by Ilean China, RN since 04/13/2021 12:00 AM  Problem: Hyperlipidemia   Priority: Medium    Long-Range Goal: Manage Cholesterol   Start Date: 02/22/2021  This Visit's Progress: On track  Recent Progress: On track  Priority: Medium  Note:   Objective: Lab Results  Component Value Date   CHOL 140 02/01/2021   HDL 50 02/01/2021   LDLCALC 70 02/01/2021   TRIG 110 02/01/2021   CHOLHDL 2.8 02/01/2021   Current Barriers:  . Chronic Disease Management support and education needs related to hyperlipidemia in a patient with HTN and DM . Stress from family situation  Nurse Case Manager Clinical Goal(s):  . patient will work with PCP to address needs related to medical management of hyperlipidemia . patient will meet with RN Care Manager to address self-management of hyperlipidemia . the patient will demonstrate ongoing self health care management ability as evidenced by taking medication as prescribed and maintaining cholesterol level within normal limites*  Interventions:  . 1:1 collaboration with [name], MD regarding development and update of comprehensive plan of care as evidenced by  provider attestation and co-signature . Inter-disciplinary care team collaboration (see longitudinal plan of care) . Evaluation of current treatment plan related to hyperlipidemia and patient's adherence to plan as established by provider. . Chart reviewed including relevant PCP office notes, lab results, and cardiology notes . Reviewed and discussed medications o Cardiologist increased lipitor to 56m daily - Patient is tolerating well. No myalgias. . Diet education provided o ADA/Carbo modified diet or Plate method: 1/2 vegetables, 1/4 lean protein, 1/4 starch . Encouraged to increase physical activity level as tolerated with a goal of at least 150 minutes a week . Reviewed upcoming appointments . Provided with [name] contact number and encouraged to reach out as needed  Patient Goals/Self-Care Activities Over the next 60 days, patient will: . Follow-up with cardiologist as planned on 04/19/21 . Take Lipitor 80 mg as prescribed . Call cardiologist or PCP with any muscle aches/pains . ADA/Carb modified diet or Plate method diet with 1/2 plate of vegetables, 1/4 lean protein, 1/4 starch . Increase activity level as tolerated with a goal of at least 150 min a week . Call RN Care Manager as needed [phone]   Care Plan : [name]: Hypertension (Adult)  Updates made by HIlean China RN since 04/13/2021 12:00 AM    Problem: Hypertension (Hypertension)   Priority: Medium    Long-Range Goal: Hypertension Monitored   This Visit's Progress: On track  Priority: Medium  Note:   Objective: BP Readings from Last 3 Encounters:  02/28/21 137/74  02/09/21 104/65  02/01/21 134/86   Current Barriers:  . Chronic Disease Management support and education needs related to hypertension . Family stress  Nurse Case Manager Clinical Goal(s):  . patient will work with PCP to address needs related to medical management of hypertension . patient will meet with RN Care Manager to address  self-management of hypertension . the patient will demonstrate ongoing self health care management ability as evidenced by monitoring and recording blood pressure at least 3 days per week and by calling PCP with any readings outside of recommended range*  Interventions:  . 1:1 collaboration with SClaretta Fraise MD regarding development and update of comprehensive plan of care as evidenced by provider attestation and co-signature . Inter-disciplinary care team collaboration (see longitudinal plan of care) . Chart reviewed including relevant office notes, correspondence notes, lab results, and imaging reports . Evaluation of current treatment plan related to hypertension and patient's adherence to plan as established by  provider. . Reviewed medications with patient and discussed importance of medication adherence . Discussed home blood pressure monitoring o Running "normal" . Encouraged patient to continue checking and recording blood pressure at least 3 days per week and to call PCP with any readings outside of recommended range . Encouraged to increase activity level as tolerated with a goal of at least 150 minutes per week . Discussed plans with patient for ongoing care management follow up and provided patient with direct contact information for care management team  Self Care Activities:  . Self administers medications as prescribed . Calls pharmacy for medication refills . Performs ADL's independently . Performs IADL's independently . Calls provider office for new concerns or questions  Patient Goals Over the next 60 days, patient will: . Patient will self administer medications as prescribed . Patient will attend all scheduled provider appointments . Increase activity level as tolerated with a goal of at least 150 minutes per week . Continue to check and record blood pressure at least 3 times per week . Call PCP with any readings outside of recommended range . Call RN Care Manager  as needed [phone]    Follow-up Plan . Telephone follow up appointment with care management team member scheduled for: [name] 04/18/21, [facility] 05/25/21 . The patient has been provided with contact information for the care management team and has been advised to call with any health related questions or concerns.  . Next PCP appointment scheduled for: 05/04/21 with Dr [name] . Next cardiology appointment scheduled for: 04/19/21  [name], BSN, RN-BC [place] [facility] [place] [facility] Direct Dial: [phone]

## 2021-04-18 ENCOUNTER — Ambulatory Visit: Payer: Medicare HMO | Admitting: Licensed Clinical Social Worker

## 2021-04-18 DIAGNOSIS — F411 Generalized anxiety disorder: Secondary | ICD-10-CM

## 2021-04-18 DIAGNOSIS — E785 Hyperlipidemia, unspecified: Secondary | ICD-10-CM

## 2021-04-18 DIAGNOSIS — I5032 Chronic diastolic (congestive) heart failure: Secondary | ICD-10-CM

## 2021-04-18 DIAGNOSIS — J441 Chronic obstructive pulmonary disease with (acute) exacerbation: Secondary | ICD-10-CM

## 2021-04-18 DIAGNOSIS — J439 Emphysema, unspecified: Secondary | ICD-10-CM | POA: Diagnosis not present

## 2021-04-18 DIAGNOSIS — E1122 Type 2 diabetes mellitus with diabetic chronic kidney disease: Secondary | ICD-10-CM

## 2021-04-18 DIAGNOSIS — I10 Essential (primary) hypertension: Secondary | ICD-10-CM

## 2021-04-18 DIAGNOSIS — G43709 Chronic migraine without aura, not intractable, without status migrainosus: Secondary | ICD-10-CM

## 2021-04-18 DIAGNOSIS — N1831 Chronic kidney disease, stage 3a: Secondary | ICD-10-CM | POA: Diagnosis not present

## 2021-04-18 NOTE — Chronic Care Management (AMB) (Signed)
Chronic Care Management    Clinical Social Work Note  04/18/2021 Name: Yvonne [name] MRN: [id number] DOB: [birth year]  Yvonne [name] is a 70 y.o. year old female who is a primary care patient of [name], [name], MD. The CCM team was consulted to assist the patient with chronic disease management and/or care coordination needs related to: [employer] .   Engaged with patient by telephone for follow up visit in response to provider referral for social work chronic care management and care coordination services.   Consent to Services:  The patient was given information about Chronic Care Management services, agreed to services, and gave verbal consent prior to initiation of services.  Please see initial visit note for detailed documentation.   Patient agreed to services and consent obtained.   Assessment: Review of patient past medical history, allergies, medications, and health status, including review of relevant consultants reports was performed today as part of a comprehensive evaluation and provision of chronic care management and care coordination services.     SDOH (Social Determinants of Health) assessments and interventions performed:  SDOH Interventions   Flowsheet Row Most Recent Value  SDOH Interventions   Depression Interventions/Treatment  Currently on Treatment       Advanced Directives Status: See Vynca application for related entries.  CCM Care Plan  Allergies  Allergen Reactions  . Penicillins Hives and Itching    Has patient had a PCN reaction causing immediate rash, facial/tongue/throat swelling, SOB or lightheadedness with hypotension: Yes Has patient had a PCN reaction causing severe rash involving mucus membranes or skin necrosis: Unk Has patient had a PCN reaction that required hospitalization: No Has patient had a PCN reaction occurring within the last 10 years: No If all of the above answers are "NO", then may proceed with Cephalosporin use.  Has  patient had a PCN reaction causing immediate rash, facial/tongue/throat swelling, SOB or lightheadedness with hypotension: Yes Has patient had a PCN reaction causing severe rash involving mucus membranes or skin necrosis: No Has patient had a PCN reaction that required hospitalization No Has patient had a PCN reaction occurring within the last 10 years: No If all of the above answers are "NO", then may proceed with Cephalosporin use. Has patient had a PCN reaction causing immediate rash, facial/tongue/throat swelling, SOB or lightheadedness with hypotension: Yes Has patient had a PCN reaction causing severe rash involving mucus membranes or skin necrosis: Unk Has patient had a PCN reaction that required hospitalization: No Has patient had a PCN reaction occurring within the last 10 years: No If all of the above answers are "NO", then may proceed with Cephalosporin use. Has patient had a PCN reaction causing immediate rash, facial/tongue/throat swelling, SOB or lightheadedness with hypotension: Yes Has patient had a PCN reaction causing severe rash involving mucus membranes or skin necrosis: No Has patient had a PCN reaction that required hospitalization No Has patient had a PCN reaction occurring within the last 10 years: No If all of the above answers are "NO", then may proceed with Cephalosporin use. Has patient had a PCN reaction causing immediate rash, facial/tongue/throat swelling, SOB or lightheadedness with hypotension: Yes Has patient had a PCN reaction causing sev... (TRUNCATED)  . Gabapentin Other (See Comments)    Causes a lot of lethargy at a higher dose Causes a lot of lethargy at a higher dose Causes a lot of lethargy at a higher dose  . Lisinopril Cough    HAS A CHRONIC COUGH AND DID NOT NOTICE  IMPROVEMENT OF COUGH WHEN LISINOPRIL WAS STOPPED HAS A CHRONIC COUGH AND DID NOT NOTICE IMPROVEMENT OF COUGH WHEN LISINOPRIL WAS STOPPED HAS A CHRONIC COUGH AND DID NOT NOTICE  IMPROVEMENT OF COUGH WHEN LISINOPRIL WAS STOPPED  . Soap Rash    DIAL soap    Outpatient Encounter Medications as of 04/18/2021  Medication Sig  . Accu-Chek Softclix Lancets lancets Test BS up to 4 times daily Dx E11.69 (Patient not taking: Reported on 02/28/2021)  . albuterol (VENTOLIN HFA) 108 (90 Base) MCG/ACT inhaler TAKE 2 PUFFS BY MOUTH EVERY 6 HOURS AS NEEDED FOR WHEEZE OR SHORTNESS OF BREATH  . alendronate (FOSAMAX) 70 MG tablet Take 1 tablet (70 mg total) by mouth every 7 (seven) days. Take with a full glass of water on an empty stomach.  . ALPRAZolam (XANAX) 1 MG tablet Take 1 tablet (1 mg total) by mouth 3 (three) times daily.  [name] Kitchen amLODipine (NORVASC) 10 MG tablet TAKE 1 TABLET BY MOUTH DAILY.  [name] Kitchen aspirin 325 MG tablet Take 325 mg by mouth daily. (Patient not taking: Reported on 02/28/2021)  . atorvastatin (LIPITOR) 80 MG tablet Take 1 tablet (80 mg total) by mouth daily.  . Blood Glucose Monitoring Suppl (ACCU-CHEK GUIDE) w/Device KIT Test Bs QID Dx E11.69  . clopidogrel (PLAVIX) 75 MG tablet Take 1 tablet (75 mg total) by mouth daily with breakfast.  . dapagliflozin propanediol (FARXIGA) 10 MG TABS tablet Take 10 mg by mouth daily.  [name] Kitchen ezetimibe (ZETIA) 10 MG tablet Take 1 tablet (10 mg total) by mouth daily. For cholesterol  . famotidine (PEPCID) 20 MG tablet famotidine 20 mg tablet  TAKE 1 TABLET BY MOUTH AT BEDTIME  . fluticasone furoate-vilanterol (BREO ELLIPTA) 100-25 MCG/INH AEPB Inhale 1 puff into the lungs daily.  . furosemide (LASIX) 20 MG tablet Take 1 tablet (20 mg total) by mouth daily.  [name] Kitchen glipiZIDE (GLUCOTROL) 5 MG tablet Take 1 tablet (5 mg total) by mouth daily before breakfast.  . glucose blood (ACCU-CHEK GUIDE) test strip TEST BLOOD SUGAR 4 TIMES DAILY Dx E11.69  . loratadine (CLARITIN) 10 MG tablet Take 1 tablet (10 mg total) by mouth daily.  . metoprolol tartrate (LOPRESSOR) 50 MG tablet Take 2 hours prior to Cardiac CT (Patient not taking: Reported on 02/28/2021)  .  nitroGLYCERIN (NITROSTAT) 0.4 MG SL tablet Place 1 tablet (0.4 mg total) under the tongue every 5 (five) minutes as needed for chest pain.  [name] Kitchen ofloxacin (FLOXIN) 0.3 % OTIC solution ofloxacin 0.3 % ear drops  INSTILL 5 DROPS IN EACH EAR TWICE A DAY AS NEEDED FOR EAR PAIN  . QUEtiapine (SEROQUEL) 25 MG tablet Take 1 tablet (25 mg total) by mouth at bedtime.  . Semaglutide,0.25 or 0.5MG /DOS, (OZEMPIC, 0.25 OR 0.5 MG/DOSE,) 2 MG/1.5ML SOPN Inject 0.5 mg into the skin once a week. On Wednesday  . solifenacin (VESICARE) 10 MG tablet Take 1 tablet (10 mg total) by mouth daily.  [name] Kitchen triamcinolone (KENALOG) 0.1 % Apply 1 application topically 3 (three) times daily. (Patient not taking: Reported on 02/28/2021)   No facility-administered encounter medications on file as of 04/18/2021.    Patient Active Problem List   Diagnosis Date Noted  . Restless legs 02/27/2021  . Insomnia 02/27/2021  . Polyneuropathy due to type 2 diabetes mellitus ([place]) 02/27/2021  . Palpitations 01/20/2021  . Aortic atherosclerosis ([place]) 01/20/2021  . Orthostatic hypotension 01/20/2021  . Carpal tunnel syndrome 08/23/2020  . Diabetic lipidosis ([place]) 09/09/2019  . Hypokalemia 12/11/2018  . Benzodiazepine dependence,  continuous ([place]) 10/22/2018  . Chronic migraine without aura without status migrainosus, not intractable 02/19/2018  . Chronic diastolic CHF (congestive heart failure) ([place]) 03/21/2017  . Chronic bilateral low back pain with bilateral sciatica 02/27/2017  . Spondylosis of lumbar region without myelopathy or radiculopathy 08/15/2016  . Pain of both hip joints 06/18/2016  . Pain syndrome, chronic 06/18/2016  . Chronic cough 05/17/2016  . Hyperlipidemia with target LDL less than 70 11/04/2015  . Macular hole of right eye 09/14/2015  . Obesity 07/20/2015  . Osteopenia 07/20/2015  . Precordial pain 07/06/2015  . Overactive bladder   . Hypertension   . GAD (generalized anxiety disorder)   . Acid reflux disease  08/17/2014    Conditions to be addressed/monitored: Monitor client management of anxiety and stress issues faced   Care Plan : [name] Care Plan  Updates made by Katha Cabal, [name] since 04/18/2021 12:00 AM    Problem: Emotional Distress     Goal: Needs help with coping skills to manage anxiety, stress, and depression symptoms faced   Start Date: 04/18/2021  Expected End Date: 07/19/2021  This Visit's Progress: On track  Recent Progress: Not on track  Priority: Medium  Note:   Current Barriers:  . Eating and swallowing is difficult because of inflammation in her throat . Pain issues . Suicidal Ideation/Homicidal Ideation: No . Mental Health Challenges  Clinical Social Work Goal(s):  . patient will work with SW  by telephone or in person in next 30 days to discuss strategies for managing stress or anxiety or depression symptoms faced.  . Patient to talk with [name] or [name] as needed for CCM support in next 30 days . Patient will take time for self care, relaxation and rest as needed in next 30 days  Interventions: 1:1 collaboration with Dr. [name] regarding development and update of comprehensive plan of care as evidenced by  provider attestation and co-signature Talked with client about mood of client (she said she gets sad in thinking of family losses, such as the death of her brother, her mother and her father) Talked with client about decreased appetite Talked with client about sleeping challenges Encouraged client to talk with [facility] for nursing support Encouraged client to talk with Triage Nurse at [facility] as needed for nursing support Encouraged client to practice self care by getting plenty of rest and relaxation or by doing hobbies of choice. She enjoys reading and walking as ways to exercise Talked with client about medication procurement  Patient Self Care Activities:  . Self administers medications as prescribed . Attends all scheduled provider  appointments . Performs ADL's independently  Patient Coping Strengths:  . Supportive Relationships . Family . Self Advocate  Patient Self Care Deficits:  . Pain issues . Challenges with Diabetes management  Patient Goals:  - exercise at least 2 to 3 times per week - spend time or talk with others every day - practice relaxation or meditation daily - keep a calendar with appointment dates  Follow Up Plan: [name] to call client on 05/19/21 to assess client needs      [name].Orie Baxendale MSW, [name] Licensed Clinical Social Worker [facility] Care Management [phone]

## 2021-04-18 NOTE — Patient Instructions (Signed)
Visit Information  PATIENT GOALS: Goals Addressed              This Visit's Progress   .  Manage Anxiety, Stress and Depression symptoms faced (pt-stated)         Timeframe: Short Term  This Visit's Progress: On Track  Priority : Medium  Start Date: 04/18/21  Expected End Date 07/19/21  Completed Date  Follow Up Date:  05/19/21     Needs help with coping skills to manage anxiety, stress or depression symptoms faced :   Patient Self Care Activities:  . Self administers medications as prescribed . Attends all scheduled provider appointments . Performs ADL's independently  Patient Coping Strengths:  . Supportive Relationships . Family . Self Advocate  Patient Self Care Deficits:  . Pain issues . Challenges with Diabetes management  Patient Goals:  - exercise at least 2 to 3 times per week - spend time or talk with others every day - practice relaxation or meditation daily - keep a calendar with appointment dates  Follow Up Plan: [name] to call client on 05/19/21 to assess client needs     .          [name].Izekiel Flegel MSW, [name] Licensed Clinical Social Worker [facility] Care Management [phone]

## 2021-04-19 ENCOUNTER — Ambulatory Visit: Payer: Medicare HMO | Admitting: Physician Assistant

## 2021-04-25 DIAGNOSIS — I700 Atherosclerosis of aorta: Secondary | ICD-10-CM | POA: Diagnosis not present

## 2021-04-25 DIAGNOSIS — R0781 Pleurodynia: Secondary | ICD-10-CM | POA: Diagnosis not present

## 2021-04-25 DIAGNOSIS — S29011A Strain of muscle and tendon of front wall of thorax, initial encounter: Secondary | ICD-10-CM | POA: Diagnosis not present

## 2021-04-25 DIAGNOSIS — E119 Type 2 diabetes mellitus without complications: Secondary | ICD-10-CM | POA: Diagnosis not present

## 2021-04-25 DIAGNOSIS — R0789 Other chest pain: Secondary | ICD-10-CM | POA: Diagnosis not present

## 2021-04-25 DIAGNOSIS — R0782 Intercostal pain: Secondary | ICD-10-CM | POA: Diagnosis not present

## 2021-04-25 DIAGNOSIS — I10 Essential (primary) hypertension: Secondary | ICD-10-CM | POA: Diagnosis not present

## 2021-04-28 ENCOUNTER — Other Ambulatory Visit: Payer: Self-pay | Admitting: Family Medicine

## 2021-04-28 DIAGNOSIS — F1320 Sedative, hypnotic or anxiolytic dependence, uncomplicated: Secondary | ICD-10-CM

## 2021-04-28 DIAGNOSIS — F411 Generalized anxiety disorder: Secondary | ICD-10-CM

## 2021-05-01 ENCOUNTER — Other Ambulatory Visit: Payer: Self-pay | Admitting: Family Medicine

## 2021-05-01 DIAGNOSIS — I10 Essential (primary) hypertension: Secondary | ICD-10-CM

## 2021-05-04 ENCOUNTER — Ambulatory Visit (INDEPENDENT_AMBULATORY_CARE_PROVIDER_SITE_OTHER): Payer: Medicare HMO | Admitting: Family Medicine

## 2021-05-04 ENCOUNTER — Encounter: Payer: Self-pay | Admitting: Family Medicine

## 2021-05-04 ENCOUNTER — Other Ambulatory Visit: Payer: Self-pay

## 2021-05-04 VITALS — BP 115/76 | HR 67 | Temp 97.4°F | Ht 62.0 in | Wt 180.0 lb

## 2021-05-04 DIAGNOSIS — F411 Generalized anxiety disorder: Secondary | ICD-10-CM

## 2021-05-04 DIAGNOSIS — E1122 Type 2 diabetes mellitus with diabetic chronic kidney disease: Secondary | ICD-10-CM

## 2021-05-04 DIAGNOSIS — J439 Emphysema, unspecified: Secondary | ICD-10-CM

## 2021-05-04 DIAGNOSIS — E785 Hyperlipidemia, unspecified: Secondary | ICD-10-CM | POA: Diagnosis not present

## 2021-05-04 DIAGNOSIS — N1831 Chronic kidney disease, stage 3a: Secondary | ICD-10-CM

## 2021-05-04 DIAGNOSIS — F1320 Sedative, hypnotic or anxiolytic dependence, uncomplicated: Secondary | ICD-10-CM

## 2021-05-04 LAB — CMP14+EGFR
ALT: 38 IU/L — ABNORMAL HIGH (ref 0–32)
AST: 28 IU/L (ref 0–40)
Albumin/Globulin Ratio: 1.9 (ref 1.2–2.2)
Albumin: 4 g/dL (ref 3.7–4.7)
Alkaline Phosphatase: 167 IU/L — ABNORMAL HIGH (ref 44–121)
BUN/Creatinine Ratio: 15 (ref 12–28)
BUN: 14 mg/dL (ref 8–27)
Bilirubin Total: 0.3 mg/dL (ref 0.0–1.2)
CO2: 24 mmol/L (ref 20–29)
Calcium: 9.3 mg/dL (ref 8.7–10.3)
Chloride: 105 mmol/L (ref 96–106)
Creatinine, Ser: 0.96 mg/dL (ref 0.57–1.00)
Globulin, Total: 2.1 g/dL (ref 1.5–4.5)
Glucose: 131 mg/dL — ABNORMAL HIGH (ref 65–99)
Potassium: 4.1 mmol/L (ref 3.5–5.2)
Sodium: 141 mmol/L (ref 134–144)
Total Protein: 6.1 g/dL (ref 6.0–8.5)
eGFR: 63 mL/min/{1.73_m2} (ref >59)

## 2021-05-04 LAB — LIPID PANEL
Chol/HDL Ratio: 2.4 ratio (ref 0.0–4.4)
Cholesterol, Total: 122 mg/dL (ref 100–199)
HDL: 50 mg/dL (ref >39)
LDL Chol Calc (NIH): 54 mg/dL (ref 0–99)
Triglycerides: 92 mg/dL (ref 0–149)
VLDL Cholesterol Cal: 18 mg/dL (ref 5–40)

## 2021-05-04 LAB — CBC WITH DIFFERENTIAL/PLATELET
Basophils Absolute: 0 10*3/uL (ref 0.0–0.2)
Basos: 1 %
EOS (ABSOLUTE): 0.2 10*3/uL (ref 0.0–0.4)
Eos: 2 %
Hematocrit: 40.3 % (ref 34.0–46.6)
Hemoglobin: 13.8 g/dL (ref 11.1–15.9)
Immature Grans (Abs): 0 10*3/uL (ref 0.0–0.1)
Immature Granulocytes: 0 %
Lymphocytes Absolute: 2.5 10*3/uL (ref 0.7–3.1)
Lymphs: 36 %
MCH: 28.8 pg (ref 26.6–33.0)
MCHC: 34.2 g/dL (ref 31.5–35.7)
MCV: 84 fL (ref 79–97)
Monocytes Absolute: 0.4 10*3/uL (ref 0.1–0.9)
Monocytes: 6 %
Neutrophils Absolute: 3.9 10*3/uL (ref 1.4–7.0)
Neutrophils: 55 %
Platelets: 245 10*3/uL (ref 150–450)
RBC: 4.79 x10E6/uL (ref 3.77–5.28)
RDW: 12.8 % (ref 11.7–15.4)
WBC: 7 10*3/uL (ref 3.4–10.8)

## 2021-05-04 LAB — BAYER DCA HB A1C WAIVED: HB A1C (BAYER DCA - WAIVED): 8.3 % — ABNORMAL HIGH (ref <7.0)

## 2021-05-04 MED ORDER — OXYBUTYNIN CHLORIDE ER 10 MG PO TB24: 10.0000 mg | ORAL_TABLET | Freq: Every day | ORAL | 3 refills | Status: DC

## 2021-05-04 MED ORDER — ALPRAZOLAM 1 MG PO TABS: ORAL_TABLET | ORAL | 2 refills | Status: DC

## 2021-05-04 MED ORDER — ALBUTEROL SULFATE HFA 108 (90 BASE) MCG/ACT IN AERS: INHALATION_SPRAY | RESPIRATORY_TRACT | 0 refills | Status: DC

## 2021-05-04 NOTE — Progress Notes (Signed)
Subjective:  Patient ID: Yvonne [name],  female    DOB: September 20, 1951  Age: 70 y.o.    CC: Medical Management of Chronic Issues   HPI Yvonne [name] presents for  follow-up of hypertension. Patient has no history of headache chest pain or shortness of breath or recent cough. Patient also denies symptoms of TIA such as numbness weakness lateralizing. Patient denies side effects from medication. States taking it regularly.  Patient also  in for follow-up of elevated cholesterol. Doing well without complaints on current medication. Denies side effects  including myalgia and arthralgia and nausea. Also in today for liver function testing. Currently no chest pain, shortness of breath or other cardiovascular related symptoms noted.  Follow-up of diabetes. Patient does check blood sugar at home. Readings run between 80s and 126 Patient denies symptoms such as excessive hunger or urinary frequency, excessive hunger, nausea No significant hypoglycemic spells noted. Medications reviewed. Pt reports taking them regularly. Pt. denies complication/adverse reaction today.    History Yvonne [name] has a past medical history of Anxiety, Arthritis, Asthma, Chronic cough, Chronic otitis media (12/2017), Full dentures, GERD (gastroesophageal reflux disease), History of stroke (09/2017), Hyperlipidemia, Hypertension, Non-insulin dependent type 2 diabetes mellitus (HCC), Overactive bladder, Recurrent acute suppurative otitis media without spontaneous rupture of left tympanic membrane (02/19/2018), and Transient cerebral ischemia.   She has a past surgical history that includes Cholecystectomy; Colonoscopy (N/A, 08/17/2015); Abdominal hysterectomy; Lacrimal duct exploration (Bilateral); Cataract extraction w/ intraocular lens implant (Right); Vitrectomy (Left, 09/14/2015); Gas insertion (Right); and Myringotomy with tube placement (Bilateral, 01/28/2018).   Her family history includes Arthritis in her brother and mother;  Arthritis-Osteo in her son; Asthma in her father; CVA in her mother; Cancer in her brother; Diabetes in her father and mother; Heart disease in her father; Hepatitis C in her sister; Peripheral Artery Disease in her daughter; Stroke in her mother.She reports that she has never smoked. She has never used smokeless tobacco. She reports that she does not drink alcohol and does not use drugs.  Current Outpatient Medications on File Prior to Visit  Medication Sig Dispense Refill   Accu-Chek Softclix Lancets lancets Test BS up to 4 times daily Dx E11.69 400 each 3   alendronate (FOSAMAX) 70 MG tablet Take 1 tablet (70 mg total) by mouth every 7 (seven) days. Take with a full glass of water on an empty stomach. 13 tablet 3   amLODipine (NORVASC) 10 MG tablet TAKE 1 TABLET BY MOUTH EVERY DAY 90 tablet 1   aspirin 325 MG tablet Take 325 mg by mouth daily.     atorvastatin (LIPITOR) 80 MG tablet Take 1 tablet (80 mg total) by mouth daily. 90 tablet 2   Blood Glucose Monitoring Suppl (ACCU-CHEK GUIDE) w/Device KIT Test Bs QID Dx E11.69 1 kit 0   clopidogrel (PLAVIX) 75 MG tablet Take 1 tablet (75 mg total) by mouth daily with breakfast. 90 tablet 1   dapagliflozin propanediol (FARXIGA) 10 MG TABS tablet Take 10 mg by mouth daily.     ezetimibe (ZETIA) 10 MG tablet Take 1 tablet (10 mg total) by mouth daily. For cholesterol 90 tablet 1   famotidine (PEPCID) 20 MG tablet famotidine 20 mg tablet  TAKE 1 TABLET BY MOUTH AT BEDTIME     fluticasone furoate-vilanterol (BREO ELLIPTA) 100-25 MCG/INH AEPB Inhale 1 puff into the lungs daily. 180 each 1   furosemide (LASIX) 20 MG tablet Take 1 tablet (20 mg total) by mouth daily. 90 tablet 1  glipiZIDE (GLUCOTROL) 5 MG tablet Take 1 tablet (5 mg total) by mouth daily before breakfast. 90 tablet 1   glucose blood (ACCU-CHEK GUIDE) test strip TEST BLOOD SUGAR 4 TIMES DAILY Dx E11.69 400 strip 3   loratadine (CLARITIN) 10 MG tablet Take 1 tablet (10 mg total) by mouth  daily. 90 tablet 3   metoprolol tartrate (LOPRESSOR) 50 MG tablet Take 2 hours prior to Cardiac CT 1 tablet 0   nitroGLYCERIN (NITROSTAT) 0.4 MG SL tablet Place 1 tablet (0.4 mg total) under the tongue every 5 (five) minutes as needed for chest pain. 25 tablet 10   ofloxacin (FLOXIN) 0.3 % OTIC solution ofloxacin 0.3 % ear drops  INSTILL 5 DROPS IN EACH EAR TWICE A DAY AS NEEDED FOR EAR PAIN     QUEtiapine (SEROQUEL) 25 MG tablet Take 1 tablet (25 mg total) by mouth at bedtime. 90 tablet 1   Semaglutide,0.25 or 0.5MG /DOS, (OZEMPIC, 0.25 OR 0.5 MG/DOSE,) 2 MG/1.5ML SOPN Inject 0.5 mg into the skin once a week. On Wednesday 1 mL 2   solifenacin (VESICARE) 10 MG tablet Take 1 tablet (10 mg total) by mouth daily. 90 tablet 1   triamcinolone (KENALOG) 0.1 % Apply 1 application topically 3 (three) times daily. 30 each 0   No current facility-administered medications on file prior to visit.    ROS Review of Systems  Constitutional: Negative.   HENT: Negative.    Eyes:  Negative for visual disturbance.  Respiratory:  Negative for shortness of breath.   Cardiovascular:  Negative for chest pain.  Gastrointestinal:  Negative for abdominal pain.  Genitourinary:  Positive for enuresis (coughing, laughing, walking, bending over) and frequency.  Musculoskeletal:  Negative for arthralgias.   Objective:  BP 115/76   Pulse 67   Temp (!) 97.4 F (36.3 C)   Ht 5\' 2"  (1.575 m)   Wt 180 lb (81.6 kg)   SpO2 99%   BMI 32.92 kg/m   BP Readings from Last 3 Encounters:  05/04/21 115/76  02/28/21 137/74  02/09/21 104/65    Wt Readings from Last 3 Encounters:  05/04/21 180 lb (81.6 kg)  02/28/21 176 lb (79.8 kg)  02/01/21 179 lb 12.8 oz (81.6 kg)     Physical Exam Constitutional:      General: She is not in acute distress.    Appearance: She is well-developed.  HENT:     Head: Normocephalic and atraumatic.  Eyes:     Conjunctiva/sclera: Conjunctivae normal.     Pupils: Pupils are equal,  round, and reactive to light.  Neck:     Thyroid: No thyromegaly.  Cardiovascular:     Rate and Rhythm: Normal rate and regular rhythm.     Heart sounds: Normal heart sounds. No murmur heard. Pulmonary:     Effort: Pulmonary effort is normal. No respiratory distress.     Breath sounds: Normal breath sounds. No wheezing or rales.  Abdominal:     General: Bowel sounds are normal. There is no distension.     Palpations: Abdomen is soft.     Tenderness: There is no abdominal tenderness.  Musculoskeletal:        General: Normal range of motion.     Cervical back: Normal range of motion and neck supple.  Lymphadenopathy:     Cervical: No cervical adenopathy.  Skin:    General: Skin is warm and dry.  Neurological:     Mental Status: She is alert and oriented to person, place, and time.  Psychiatric:        Behavior: Behavior normal.        Thought Content: Thought content normal.        Judgment: Judgment normal.    Diabetic Foot Exam - Simple   No data filed       Assessment & Plan:   Yvonne [name] was seen today for medical management of chronic issues.  Diagnoses and all orders for this visit:  Type 2 diabetes mellitus with stage 3a chronic kidney disease, without long-term current use of insulin (HCC) -     CBC with Differential/Platelet -     CMP14+EGFR -     Bayer DCA Hb A1c Waived  Hyperlipidemia with target LDL less than 70 -     Lipid panel  GAD (generalized anxiety disorder) -     ALPRAZolam (XANAX) 1 MG tablet; ! Each morning, one each evening, and 1/2 each afternoon  Benzodiazepine dependence, continuous (HCC) -     ALPRAZolam (XANAX) 1 MG tablet; ! Each morning, one each evening, and 1/2 each afternoon  Pulmonary emphysema, unspecified emphysema type (HCC) -     albuterol (VENTOLIN HFA) 108 (90 Base) MCG/ACT inhaler; TAKE 2 PUFFS BY MOUTH EVERY 6 HOURS AS NEEDED FOR WHEEZE OR SHORTNESS OF BREATH  Other orders -     oxybutynin (DITROPAN XL) 10 MG 24 hr tablet;  Take 1 tablet (10 mg total) by mouth at bedtime.  I have changed [name]. Signer's ALPRAZolam. I am also having her start on oxybutynin. Additionally, I am having her maintain her loratadine, aspirin, Accu-Chek Softclix Lancets, solifenacin, furosemide, Breo Ellipta, ezetimibe, clopidogrel, triamcinolone cream, metoprolol tartrate, glipiZIDE, Ozempic (0.25 or 0.5 MG/DOSE), QUEtiapine, atorvastatin, nitroGLYCERIN, dapagliflozin propanediol, famotidine, ofloxacin, Accu-Chek Guide, alendronate, Accu-Chek Guide, amLODipine, and albuterol.  Meds ordered this encounter  Medications   ALPRAZolam (XANAX) 1 MG tablet    Sig: ! Each morning, one each evening, and 1/2 each afternoon    Dispense:  75 tablet    Refill:  2   albuterol (VENTOLIN HFA) 108 (90 Base) MCG/ACT inhaler    Sig: TAKE 2 PUFFS BY MOUTH EVERY 6 HOURS AS NEEDED FOR WHEEZE OR SHORTNESS OF BREATH    Dispense:  54 each    Refill:  0   oxybutynin (DITROPAN XL) 10 MG 24 hr tablet    Sig: Take 1 tablet (10 mg total) by mouth at bedtime.    Dispense:  90 tablet    Refill:  3     Follow-up: Return in about 3 months (around 08/04/2021).  [name], M.D.

## 2021-05-07 ENCOUNTER — Encounter: Payer: Self-pay | Admitting: Family Medicine

## 2021-05-08 DIAGNOSIS — I6381 Other cerebral infarction due to occlusion or stenosis of small artery: Secondary | ICD-10-CM | POA: Diagnosis not present

## 2021-05-08 DIAGNOSIS — R079 Chest pain, unspecified: Secondary | ICD-10-CM | POA: Diagnosis not present

## 2021-05-08 DIAGNOSIS — E1142 Type 2 diabetes mellitus with diabetic polyneuropathy: Secondary | ICD-10-CM | POA: Diagnosis not present

## 2021-05-08 DIAGNOSIS — I951 Orthostatic hypotension: Secondary | ICD-10-CM | POA: Diagnosis not present

## 2021-05-11 ENCOUNTER — Other Ambulatory Visit: Payer: Self-pay

## 2021-05-11 ENCOUNTER — Ambulatory Visit (INDEPENDENT_AMBULATORY_CARE_PROVIDER_SITE_OTHER): Payer: Medicare HMO | Admitting: Family Medicine

## 2021-05-11 DIAGNOSIS — J988 Other specified respiratory disorders: Secondary | ICD-10-CM

## 2021-05-11 DIAGNOSIS — Z8709 Personal history of other diseases of the respiratory system: Secondary | ICD-10-CM | POA: Diagnosis not present

## 2021-05-11 MED ORDER — GUAIFENESIN ER 600 MG PO TB12: 600.0000 mg | ORAL_TABLET | Freq: Two times a day (BID) | ORAL | 0 refills | Status: DC | PRN

## 2021-05-11 MED ORDER — DOXYCYCLINE HYCLATE 100 MG PO TABS: 100.0000 mg | ORAL_TABLET | Freq: Two times a day (BID) | ORAL | 0 refills | Status: AC

## 2021-05-11 NOTE — Progress Notes (Signed)
   Virtual Visit  Note Due to COVID-19 pandemic this visit was conducted virtually. This visit type was conducted due to national recommendations for restrictions regarding the COVID-19 Pandemic (e.g. social distancing, sheltering in place) in an effort to limit this patient's exposure and mitigate transmission in our community. All issues noted in this document were discussed and addressed.  A physical exam was not performed with this format.  I connected with Yvonne [name] on 05/11/21 at 0955 by telephone and verified that I am speaking with the correct person using two identifiers. Yvonne [name] is currently located at home and her daughter in law is currently with her during the visit. The provider, Gwenlyn Perking, FNP is located in their office at time of visit.  I discussed the limitations, risks, security and privacy concerns of performing an evaluation and management service by telephone and the availability of in person appointments. I also discussed with the patient that there may be a patient responsible charge related to this service. The patient expressed understanding and agreed to proceed.  CC: cough  History and Present Illness:  HPI Yvonne [name] reports congestion and runny nose x3 days. She also reports productive cough with yellow sputum, HA, body aches, and sore throat. She denies fever, chills, shortness of breath, chest pain, nausea, vomiting, or diarrhea. She has a history of COPD, HTN, and DM. She has not taken anything for her symptoms. She has been using her inhalers as prescribed. She has been vaccinated against Covid, denies known exposure.    ROS As per HPI.   Observations/Objective: Alert and oriented x 3. Able to speak in full sentences without difficulty.    Assessment and Plan: Yvonne [name] was seen today for cough.  Diagnoses and all orders for this visit:  Respiratory infection History of COPD Covid test pending, quarantine until results. Given history of COPD,  abx were prescribed. Mucinex for cough, congestion. Tylenol as needed for pain. Rest, hydration. Return to office for new or worsening symptoms, or if symptoms persist.  -     doxycycline (VIBRA-TABS) 100 MG tablet; Take 1 tablet (100 mg total) by mouth 2 (two) times daily for 7 days. 1 po bid -     guaiFENesin (MUCINEX) 600 MG 12 hr tablet; Take 1-2 tablets (600-1,200 mg total) by mouth 2 (two) times daily as needed for cough or to loosen phlegm. -     Novel Coronavirus, NAA ([facility]); Future   Follow Up Instructions: As needed.     I discussed the assessment and treatment plan with the patient. The patient was provided an opportunity to ask questions and all were answered. The patient agreed with the plan and demonstrated an understanding of the instructions.   The patient was advised to call back or seek an in-person evaluation if the symptoms worsen or if the condition fails to improve as anticipated.  The above assessment and management plan was discussed with the patient. The patient verbalized understanding of and has agreed to the management plan. Patient is aware to call the clinic if symptoms persist or worsen. Patient is aware when to return to the clinic for a follow-up visit. Patient educated on when it is appropriate to go to the emergency department.   Time call ended:  1006  I provided 11 minutes of  non face-to-face time during this encounter.    Gwenlyn Perking, FNP

## 2021-05-12 LAB — NOVEL CORONAVIRUS, NAA: SARS-CoV-2, NAA: NOT DETECTED

## 2021-05-12 LAB — SARS-COV-2, NAA 2 DAY TAT

## 2021-05-19 ENCOUNTER — Telehealth: Payer: Medicare HMO

## 2021-05-22 ENCOUNTER — Ambulatory Visit (HOSPITAL_COMMUNITY): Payer: Medicare HMO

## 2021-05-22 DIAGNOSIS — S0181XA Laceration without foreign body of other part of head, initial encounter: Secondary | ICD-10-CM | POA: Diagnosis not present

## 2021-05-22 DIAGNOSIS — E785 Hyperlipidemia, unspecified: Secondary | ICD-10-CM | POA: Diagnosis not present

## 2021-05-22 DIAGNOSIS — J42 Unspecified chronic bronchitis: Secondary | ICD-10-CM | POA: Diagnosis not present

## 2021-05-22 DIAGNOSIS — E1165 Type 2 diabetes mellitus with hyperglycemia: Secondary | ICD-10-CM | POA: Diagnosis not present

## 2021-05-22 DIAGNOSIS — I10 Essential (primary) hypertension: Secondary | ICD-10-CM | POA: Diagnosis not present

## 2021-05-22 DIAGNOSIS — S0990XA Unspecified injury of head, initial encounter: Secondary | ICD-10-CM | POA: Diagnosis not present

## 2021-05-22 DIAGNOSIS — K219 Gastro-esophageal reflux disease without esophagitis: Secondary | ICD-10-CM | POA: Diagnosis not present

## 2021-05-22 DIAGNOSIS — J329 Chronic sinusitis, unspecified: Secondary | ICD-10-CM | POA: Diagnosis not present

## 2021-05-22 DIAGNOSIS — G709 Myoneural disorder, unspecified: Secondary | ICD-10-CM | POA: Diagnosis not present

## 2021-05-22 DIAGNOSIS — R52 Pain, unspecified: Secondary | ICD-10-CM | POA: Diagnosis not present

## 2021-05-22 DIAGNOSIS — M47812 Spondylosis without myelopathy or radiculopathy, cervical region: Secondary | ICD-10-CM | POA: Diagnosis not present

## 2021-05-22 DIAGNOSIS — S199XXA Unspecified injury of neck, initial encounter: Secondary | ICD-10-CM | POA: Diagnosis not present

## 2021-05-22 DIAGNOSIS — W01198A Fall on same level from slipping, tripping and stumbling with subsequent striking against other object, initial encounter: Secondary | ICD-10-CM | POA: Diagnosis not present

## 2021-05-22 DIAGNOSIS — I6381 Other cerebral infarction due to occlusion or stenosis of small artery: Secondary | ICD-10-CM | POA: Diagnosis not present

## 2021-05-22 DIAGNOSIS — M4312 Spondylolisthesis, cervical region: Secondary | ICD-10-CM | POA: Diagnosis not present

## 2021-05-22 DIAGNOSIS — E119 Type 2 diabetes mellitus without complications: Secondary | ICD-10-CM | POA: Diagnosis not present

## 2021-05-22 DIAGNOSIS — R58 Hemorrhage, not elsewhere classified: Secondary | ICD-10-CM | POA: Diagnosis not present

## 2021-05-22 DIAGNOSIS — J323 Chronic sphenoidal sinusitis: Secondary | ICD-10-CM | POA: Diagnosis not present

## 2021-05-22 DIAGNOSIS — W19XXXA Unspecified fall, initial encounter: Secondary | ICD-10-CM | POA: Diagnosis not present

## 2021-05-22 DIAGNOSIS — Z23 Encounter for immunization: Secondary | ICD-10-CM | POA: Diagnosis not present

## 2021-05-23 ENCOUNTER — Telehealth: Payer: Self-pay | Admitting: Family Medicine

## 2021-05-23 NOTE — Telephone Encounter (Signed)
Appointment scheduled.

## 2021-05-25 ENCOUNTER — Telehealth: Payer: Medicare HMO | Admitting: *Deleted

## 2021-05-25 ENCOUNTER — Ambulatory Visit: Payer: Medicare HMO | Admitting: Family Medicine

## 2021-05-31 ENCOUNTER — Ambulatory Visit (HOSPITAL_COMMUNITY)
Admission: RE | Admit: 2021-05-31 | Discharge: 2021-05-31 | Disposition: A | Discharge status: Home / Self Care | Payer: Medicare HMO | Source: Ambulatory Visit | Attending: Family Medicine | Admitting: Family Medicine

## 2021-05-31 DIAGNOSIS — Z1231 Encounter for screening mammogram for malignant neoplasm of breast: Secondary | ICD-10-CM | POA: Diagnosis not present

## 2021-06-06 ENCOUNTER — Encounter: Payer: Self-pay | Admitting: Family Medicine

## 2021-06-06 ENCOUNTER — Ambulatory Visit (INDEPENDENT_AMBULATORY_CARE_PROVIDER_SITE_OTHER): Payer: Medicare HMO | Admitting: Family Medicine

## 2021-06-06 ENCOUNTER — Other Ambulatory Visit: Payer: Self-pay

## 2021-06-06 VITALS — BP 124/73 | HR 68 | Temp 97.8°F | Ht 62.0 in | Wt 177.2 lb

## 2021-06-06 DIAGNOSIS — G44209 Tension-type headache, unspecified, not intractable: Secondary | ICD-10-CM | POA: Diagnosis not present

## 2021-06-06 DIAGNOSIS — S0101XA Laceration without foreign body of scalp, initial encounter: Secondary | ICD-10-CM

## 2021-06-06 NOTE — Progress Notes (Signed)
Subjective:  Patient ID: Yvonne [name], female    DOB: 1951-08-19  Age: 70 y.o. MRN: [id number]  CC: ER Follow up   HPI Yvonne [name] presents for throbbing HA right forehead. Moderately severe, but getting better. Onset with injury to right forehead. Multiple sutures in place due to fall while taking out the trash can. Sutures placed on 6/27. Due to depth of the gash she was told to leave them in 14-16 days.   Depression screen [facility] 2/9 06/06/2021 05/04/2021 05/04/2021  Decreased Interest 2 3 0  Down, Depressed, Hopeless 2 2 0  PHQ - 2 Score 4 5 0  Altered sleeping 3 3 -  Tired, decreased energy 1 2 -  Change in appetite 2 3 -  Feeling bad or failure about yourself  3 3 -  Trouble concentrating 0 2 -  Moving slowly or fidgety/restless 0 0 -  Suicidal thoughts 0 0 -  PHQ-9 Score 13 18 -  Difficult doing work/chores Very difficult Very difficult -  Some recent data might be hidden    History Yvonne [name] has a past medical history of Anxiety, Arthritis, Asthma, Chronic cough, Chronic otitis media (12/2017), Full dentures, GERD (gastroesophageal reflux disease), History of stroke (09/2017), Hyperlipidemia, Hypertension, Non-insulin dependent type 2 diabetes mellitus ([place]), Overactive bladder, Recurrent acute suppurative otitis media without spontaneous rupture of left tympanic membrane (02/19/2018), and Transient cerebral ischemia.   She has a past surgical history that includes Cholecystectomy; Colonoscopy (N/A, 08/17/2015); Abdominal hysterectomy; Lacrimal duct exploration (Bilateral); Cataract extraction w/ intraocular lens implant (Right); Vitrectomy (Left, 09/14/2015); Gas insertion (Right); and Myringotomy with tube placement (Bilateral, 01/28/2018).   Her family history includes Arthritis in her brother and mother; Arthritis-Osteo in her son; Asthma in her father; CVA in her mother; Cancer in her brother; Diabetes in her father and mother; Heart disease in her father; Hepatitis C in her sister;  Peripheral Artery Disease in her daughter; Stroke in her mother.She reports that she has never smoked. She has never used smokeless tobacco. She reports that she does not drink alcohol and does not use drugs.    ROS Review of Systems  Constitutional: Negative.   HENT: Negative.    Eyes:  Negative for visual disturbance.  Respiratory:  Negative for shortness of breath.   Cardiovascular:  Negative for chest pain.  Gastrointestinal:  Negative for abdominal pain.  Musculoskeletal:  Negative for arthralgias.   Objective:  BP 124/73   Pulse 68   Temp 97.8 F (36.6 C)   Ht _0  (1.575 m)   Wt 177 lb 3.2 oz (80.4 kg)   SpO2 97%   BMI 32.41 kg/m   BP Readings from Last 3 Encounters:  06/06/21 124/73  05/04/21 115/76  02/28/21 137/74    Wt Readings from Last 3 Encounters:  06/06/21 177 lb 3.2 oz (80.4 kg)  05/04/21 180 lb (81.6 kg)  02/28/21 176 lb (79.8 kg)     Physical Exam Constitutional:      General: She is not in acute distress.    Appearance: She is well-developed.  Cardiovascular:     Rate and Rhythm: Normal rate.  Pulmonary:     Breath sounds: Normal breath sounds.  Musculoskeletal:        General: Normal range of motion.  Skin:    General: Skin is warm and dry.     Findings: Lesion (The suture line is intact.  Multiple simple interrupted sutures were removed without difficulty leaving a well-healed appearing scar with  no erythema edema or drainage.) present.  Neurological:     General: No focal deficit present.     Mental Status: She is alert and oriented to person, place, and time. Mental status is at baseline.     Cranial Nerves: No cranial nerve deficit.     Motor: No weakness.     Coordination: Coordination normal.     Deep Tendon Reflexes: Reflexes normal.  Psychiatric:        Mood and Affect: Mood normal.        Thought Content: Thought content normal.      Assessment & Plan:   Yvonne [name] was seen today for er follow up.  Diagnoses and all  orders for this visit:  Acute non intractable tension-type headache  Laceration of scalp, initial encounter   Wound care reviewed.  Patient also was advised to use Tylenol for the headache pain.  Should symptoms progress she should come back for reevaluation.  There is no sign of neurological deficit that could indicate subdural etc. at this point.   I am having Yvonne [name] maintain her loratadine, aspirin, Accu-Chek Softclix Lancets, solifenacin, furosemide, Breo Ellipta, ezetimibe, clopidogrel, triamcinolone cream, metoprolol tartrate, glipiZIDE, Ozempic (0.25 or 0.5 MG/DOSE), QUEtiapine, atorvastatin, nitroGLYCERIN, dapagliflozin propanediol, famotidine, ofloxacin, Accu-Chek Guide, alendronate, Accu-Chek Guide, amLODipine, ALPRAZolam, albuterol, oxybutynin, and guaiFENesin.  Allergies as of 06/06/2021       Reactions   Penicillins Hives, Itching   Has patient had a PCN reaction causing immediate rash, facial/tongue/throat swelling, SOB or lightheadedness with hypotension: Yes Has patient had a PCN reaction causing severe rash involving mucus membranes or skin necrosis: Unk Has patient had a PCN reaction that required hospitalization: No Has patient had a PCN reaction occurring within the last 10 years: No If all of the above answers are "NO", then may proceed with Cephalosporin use. Has patient had a PCN reaction causing immediate rash, facial/tongue/throat swelling, SOB or lightheadedness with hypotension: Yes Has patient had a PCN reaction causing severe rash involving mucus membranes or skin necrosis: No Has patient had a PCN reaction that required hospitalization No Has patient had a PCN reaction occurring within the last 10 years: No If all of the above answers are "NO", then may proceed with Cephalosporin use. Has patient had a PCN reaction causing immediate rash, facial/tongue/throat swelling, SOB or lightheadedness with hypotension: Yes Has patient had a PCN reaction causing  severe rash involving mucus membranes or skin necrosis: Unk Has patient had a PCN reaction that required hospitalization: No Has patient had a PCN reaction occurring within the last 10 years: No If all of the above answers are "NO", then may proceed with Cephalosporin use. Has patient had a PCN reaction causing immediate rash, facial/tongue/throat swelling, SOB or lightheadedness with hypotension: Yes Has patient had a PCN reaction causing severe rash involving mucus membranes or skin necrosis: No Has patient had a PCN reaction that required hospitalization No Has patient had a PCN reaction occurring within the last 10 years: No If all of the above answers are "NO", then may proceed with Cephalosporin use. Has patient had a PCN reaction causing immediate rash, facial/tongue/throat swelling, SOB or lightheadedness with hypotension: Yes Has patient had a PCN reaction causing sev... (TRUNCATED)   Gabapentin Other (See Comments)   Causes a lot of lethargy at a higher dose Causes a lot of lethargy at a higher dose Causes a lot of lethargy at a higher dose   Lisinopril Cough   HAS A CHRONIC COUGH AND  DID NOT NOTICE IMPROVEMENT OF COUGH WHEN LISINOPRIL WAS STOPPED HAS A CHRONIC COUGH AND DID NOT NOTICE IMPROVEMENT OF COUGH WHEN LISINOPRIL WAS STOPPED HAS A CHRONIC COUGH AND DID NOT NOTICE IMPROVEMENT OF COUGH WHEN LISINOPRIL WAS STOPPED   Soap Rash   DIAL soap        Medication List        Accurate as of June 06, 2021  9:32 PM. If you have any questions, ask your nurse or doctor.          Accu-Chek Guide test strip Generic drug: glucose blood TEST BLOOD SUGAR 4 TIMES DAILY Dx E11.69   Accu-Chek Guide w/Device Kit Test Bs QID Dx E11.69   Accu-Chek Softclix Lancets lancets Test BS up to 4 times daily Dx E11.69   albuterol 108 (90 Base) MCG/ACT inhaler Commonly known as: VENTOLIN HFA TAKE 2 PUFFS BY MOUTH EVERY 6 HOURS AS NEEDED FOR WHEEZE OR SHORTNESS OF BREATH   alendronate  70 MG tablet Commonly known as: FOSAMAX Take 1 tablet (70 mg total) by mouth every 7 (seven) days. Take with a full glass of water on an empty stomach.   ALPRAZolam 1 MG tablet Commonly known as: XANAX ! Each morning, one each evening, and 1/2 each afternoon   amLODipine 10 MG tablet Commonly known as: NORVASC TAKE 1 TABLET BY MOUTH EVERY DAY   aspirin 325 MG tablet Take 325 mg by mouth daily.   atorvastatin 80 MG tablet Commonly known as: LIPITOR Take 1 tablet (80 mg total) by mouth daily.   Breo Ellipta 100-25 MCG/INH Aepb Generic drug: fluticasone furoate-vilanterol Inhale 1 puff into the lungs daily.   clopidogrel 75 MG tablet Commonly known as: PLAVIX Take 1 tablet (75 mg total) by mouth daily with breakfast.   dapagliflozin propanediol 10 MG Tabs tablet Commonly known as: FARXIGA Take 10 mg by mouth daily.   ezetimibe 10 MG tablet Commonly known as: Zetia Take 1 tablet (10 mg total) by mouth daily. For cholesterol   famotidine 20 MG tablet Commonly known as: PEPCID famotidine 20 mg tablet  TAKE 1 TABLET BY MOUTH AT BEDTIME   furosemide 20 MG tablet Commonly known as: LASIX Take 1 tablet (20 mg total) by mouth daily.   glipiZIDE 5 MG tablet Commonly known as: GLUCOTROL Take 1 tablet (5 mg total) by mouth daily before breakfast.   guaiFENesin 600 MG 12 hr tablet Commonly known as: Mucinex Take 1-2 tablets (600-1,200 mg total) by mouth 2 (two) times daily as needed for cough or to loosen phlegm.   loratadine 10 MG tablet Commonly known as: CLARITIN Take 1 tablet (10 mg total) by mouth daily.   metoprolol tartrate 50 MG tablet Commonly known as: LOPRESSOR Take 2 hours prior to Cardiac CT   nitroGLYCERIN 0.4 MG SL tablet Commonly known as: NITROSTAT Place 1 tablet (0.4 mg total) under the tongue every 5 (five) minutes as needed for chest pain.   ofloxacin 0.3 % OTIC solution Commonly known as: FLOXIN ofloxacin 0.3 % ear drops  INSTILL 5 DROPS IN  EACH EAR TWICE A DAY AS NEEDED FOR EAR PAIN   oxybutynin 10 MG 24 hr tablet Commonly known as: Ditropan XL Take 1 tablet (10 mg total) by mouth at bedtime.   Ozempic (0.25 or 0.5 MG/DOSE) 2 MG/1.5ML Sopn Generic drug: Semaglutide(0.25 or 0.5MG/DOS) Inject 0.5 mg into the skin once a week. On Wednesday   QUEtiapine 25 MG tablet Commonly known as: SEROQUEL Take 1 tablet (25 mg total)  by mouth at bedtime.   solifenacin 10 MG tablet Commonly known as: VESIcare Take 1 tablet (10 mg total) by mouth daily.   triamcinolone cream 0.1 % Commonly known as: KENALOG Apply 1 application topically 3 (three) times daily.         Follow-up: Return in about 3 months (around 09/06/2021), or if symptoms worsen or fail to improve.  [name], M.D.

## 2021-06-08 DIAGNOSIS — M542 Cervicalgia: Secondary | ICD-10-CM | POA: Diagnosis not present

## 2021-06-08 DIAGNOSIS — G4489 Other headache syndrome: Secondary | ICD-10-CM | POA: Diagnosis not present

## 2021-06-08 DIAGNOSIS — G44209 Tension-type headache, unspecified, not intractable: Secondary | ICD-10-CM | POA: Diagnosis not present

## 2021-06-08 DIAGNOSIS — G43009 Migraine without aura, not intractable, without status migrainosus: Secondary | ICD-10-CM | POA: Diagnosis not present

## 2021-06-13 ENCOUNTER — Ambulatory Visit (INDEPENDENT_AMBULATORY_CARE_PROVIDER_SITE_OTHER): Payer: Medicare HMO | Admitting: Nurse Practitioner

## 2021-06-13 ENCOUNTER — Encounter: Payer: Self-pay | Admitting: Nurse Practitioner

## 2021-06-13 ENCOUNTER — Other Ambulatory Visit: Payer: Self-pay

## 2021-06-13 DIAGNOSIS — H9201 Otalgia, right ear: Secondary | ICD-10-CM | POA: Insufficient documentation

## 2021-06-13 MED ORDER — PREDNISONE 10 MG (21) PO TBPK: ORAL_TABLET | ORAL | 0 refills | Status: DC

## 2021-06-13 MED ORDER — METHYLPREDNISOLONE ACETATE 40 MG/ML IJ SUSP
40.0000 mg | Freq: Once | INTRAMUSCULAR | Status: AC
  Administered 2021-06-13: 40 mg via INTRAMUSCULAR

## 2021-06-13 NOTE — Assessment & Plan Note (Signed)
Worsening ear pain after recent accident, patient has worsening pain on the right side of the face radiating to the right ear and jawline.  On assessment he had canal swollen and impacted with cerumen with only small tip of ear tubes showing.  Patient rates pain moderate to severe.  Currently using ear softening once and optic drops with mild relief.  Advised patient to return to ENT as a follow-up is scheduled for 5 August.  Depo-Medrol shot given in clinic for pain and inflammation.  6-day prednisone taper.  Patient knows to follow-up with worsening or unresolved symptoms.  Rx sent to pharmacy.

## 2021-06-13 NOTE — Patient Instructions (Signed)
Earache, Adult An earache, or ear pain, can be caused by many things, including: An infection. Ear wax buildup. Ear pressure. Something in the ear that should not be there (foreign body). A sore throat. Tooth problems. Jaw problems. Treatment of the earache will depend on the cause. If the cause is not clear or cannot be determined, you may need to watch your symptoms until your earache goes away or until a cause is found. Follow these instructions at home: Medicines Take or apply over-the-counter and prescription medicines only as told by your health care provider. If you were prescribed an antibiotic medicine, use it as told by your health care provider. Do not stop using the antibiotic even if you start to feel better. Do not put anything in your ear other than medicine that is prescribed by your health care provider. Managing pain If directed, apply heat to the affected area as often as told by your health care provider. Use the heat source that your health care provider recommends, such as a moist heat pack or a heating pad. Place a towel between your skin and the heat source. Leave the heat on for 20-30 minutes. Remove the heat if your skin turns bright red. This is especially important if you are unable to feel pain, heat, or cold. You may have a greater risk of getting burned. If directed, put ice on the affected area as often as told by your health care provider. To do this:   Put ice in a plastic bag. Place a towel between your skin and the bag. Leave the ice on for 20 minutes, 2-3 times a day. General instructions Pay attention to any changes in your symptoms. Try resting in an upright position instead of lying down. This may help to reduce pressure in your ear and relieve pain. Chew gum if it helps to relieve your ear pain. Treat any allergies as told by your health care provider. Drink enough fluid to keep your urine pale yellow. It is up to you to get the results of any  tests that were done. Ask your health care provider, or the department that is doing the tests, when your results will be ready. Keep all follow-up visits as told by your health care provider. This is important. Contact a health care provider if: Your pain does not improve within 2 days. Your earache gets worse. You have new symptoms. You have a fever. Get help right away if you: Have a severe headache. Have a stiff neck. Have trouble swallowing. Have redness or swelling behind your ear. Have fluid or blood coming from your ear. Have hearing loss. Feel dizzy. Summary An earache, or ear pain, can be caused by many things. Treatment of the earache will depend on the cause. Follow recommendations from your health care provider to treat your ear pain. If the cause is not clear or cannot be determined, you may need to watch your symptoms until your earache goes away or until a cause is found. Keep all follow-up visits as told by your health care provider. This is important. This information is not intended to replace advice given to you by your health care provider. Make sure you discuss any questions you have with your health care provider. Document Revised: 06/20/2019 Document Reviewed: 06/20/2019 [employer] Patient Education  2022 [employer]  

## 2021-06-13 NOTE — Progress Notes (Signed)
Acute Office Visit  Subjective:    Patient ID: Yvonne [name], female    DOB: 1951/01/15, 70 y.o.   MRN: [id number]  Chief Complaint  Patient presents with   Ear Pain    Otalgia  There is pain in the right ear. This is a new problem. The current episode started in the past 7 days. The problem occurs constantly. The problem has been unchanged. There has been no fever. The pain is moderate. Pertinent negatives include no coughing, diarrhea, rash or sore throat. She has tried acetaminophen for the symptoms. The treatment provided mild relief. Her past medical history is significant for a tympanostomy tube. There is no history of a chronic ear infection.    Past Medical History:  Diagnosis Date   Anxiety    Arthritis    left hand   Asthma    daily and prn inhalers   Chronic cough    Chronic otitis media 12/2017   Full dentures    GERD (gastroesophageal reflux disease)    History of stroke 09/2017   weakness right hand, numbness right side face   Hyperlipidemia    Hypertension    states under control with meds., has been on med. x a long time, per pt.   Non-insulin dependent type 2 diabetes mellitus (HCC)    Overactive bladder    Recurrent acute suppurative otitis media without spontaneous rupture of left tympanic membrane 02/19/2018   Transient cerebral ischemia     Past Surgical History:  Procedure Laterality Date   ABDOMINAL HYSTERECTOMY     complete   CATARACT EXTRACTION W/ INTRAOCULAR LENS IMPLANT Right    CHOLECYSTECTOMY     COLONOSCOPY N/A 08/17/2015   Procedure: COLONOSCOPY;  Surgeon: Malissa Hippo, MD;  Location: [facility];  Service: Endoscopy;  Laterality: N/A;  200 - moved to 7:30 - Ann notified pt   GAS INSERTION Right    x 2 - eye   LACRIMAL DUCT EXPLORATION Bilateral    removal of tear ducts   MYRINGOTOMY WITH TUBE PLACEMENT Bilateral 01/28/2018   Procedure: BILATERAL MYRINGOTOMY WITH TUBE PLACEMENT;  Surgeon: Newman Pies, MD;  Location: [facility];  Service: ENT;  Laterality: Bilateral;   VITRECTOMY Left 09/14/2015    Family History  Problem Relation Age of Onset   Arthritis Mother    Diabetes Mother    CVA Mother    Stroke Mother    Diabetes Father    Heart disease Father        CABG.  Does not know age of onset   Asthma Father    Hepatitis C Sister    Arthritis Brother    Cancer Brother        metastic cancer   Peripheral Artery Disease Daughter    Arthritis-Osteo Son     Social History   Socioeconomic History   Marital status: Married    Spouse name: Not on file   Number of children: 3   Years of education: Not on file   Highest education level: GED or equivalent  Occupational History   Occupation: retired    Comment: Producer, television/film/video and restaurants  Tobacco Use   Smoking status: Never   Smokeless tobacco: Never  Building services engineer Use: Never used  Substance and Sexual Activity   Alcohol use: No   Drug use: No   Sexual activity: Yes  Other Topics Concern   Not on file  Social History Narrative   Lives  at home home with husband with son, daughter in law and 3 children.  [name], his girlfriend and her child also moved in 10/2018      Disabled.   Social Determinants of Health   Financial Resource Strain: Low Risk    Difficulty of Paying Living Expenses: Not hard at all  Food Insecurity: No Food Insecurity   Worried About Charity fundraiser in the Last Year: Never true   [place] in the Last Year: Never true  Transportation Needs: No Transportation Needs   Lack of Transportation (Medical): No   Lack of Transportation (Non-Medical): No  Physical Activity: Sufficiently Active   Days of Exercise per Week: 7 days   Minutes of Exercise per Session: 50 min  Stress: Stress Concern Present   Feeling of Stress : Very much  Social Connections: Moderately Isolated   Frequency of Communication with Friends and Family: More than three times a week   Frequency of Social Gatherings with  Friends and Family: More than three times a week   Attends Religious Services: Never   Marine scientist or Organizations: No   Attends Music therapist: Never   Marital Status: Married  Human resources officer Violence: Not At Risk   Fear of Current or Ex-Partner: No   Emotionally Abused: No   Physically Abused: No   Sexually Abused: No    Outpatient Medications Prior to Visit  Medication Sig Dispense Refill   Accu-Chek Softclix Lancets lancets Test BS up to 4 times daily Dx E11.69 400 each 3   albuterol (VENTOLIN HFA) 108 (90 Base) MCG/ACT inhaler TAKE 2 PUFFS BY MOUTH EVERY 6 HOURS AS NEEDED FOR WHEEZE OR SHORTNESS OF BREATH 54 each 0   alendronate (FOSAMAX) 70 MG tablet Take 1 tablet (70 mg total) by mouth every 7 (seven) days. Take with a full glass of water on an empty stomach. 13 tablet 3   ALPRAZolam (XANAX) 1 MG tablet ! Each morning, one each evening, and 1/2 each afternoon 75 tablet 2   amLODipine (NORVASC) 10 MG tablet TAKE 1 TABLET BY MOUTH EVERY DAY 90 tablet 1   aspirin 325 MG tablet Take 325 mg by mouth daily.     atorvastatin (LIPITOR) 80 MG tablet Take 1 tablet (80 mg total) by mouth daily. 90 tablet 2   Blood Glucose Monitoring Suppl (ACCU-CHEK GUIDE) w/Device KIT Test Bs QID Dx E11.69 1 kit 0   clopidogrel (PLAVIX) 75 MG tablet Take 1 tablet (75 mg total) by mouth daily with breakfast. 90 tablet 1   dapagliflozin propanediol (FARXIGA) 10 MG TABS tablet Take 10 mg by mouth daily.     ezetimibe (ZETIA) 10 MG tablet Take 1 tablet (10 mg total) by mouth daily. For cholesterol 90 tablet 1   famotidine (PEPCID) 20 MG tablet famotidine 20 mg tablet  TAKE 1 TABLET BY MOUTH AT BEDTIME     fluticasone furoate-vilanterol (BREO ELLIPTA) 100-25 MCG/INH AEPB Inhale 1 puff into the lungs daily. 180 each 1   furosemide (LASIX) 20 MG tablet Take 1 tablet (20 mg total) by mouth daily. 90 tablet 1   glipiZIDE (GLUCOTROL) 5 MG tablet Take 1 tablet (5 mg total) by mouth daily  before breakfast. 90 tablet 1   glucose blood (ACCU-CHEK GUIDE) test strip TEST BLOOD SUGAR 4 TIMES DAILY Dx E11.69 400 strip 3   loratadine (CLARITIN) 10 MG tablet Take 1 tablet (10 mg total) by mouth daily. 90 tablet 3   nitroGLYCERIN (  NITROSTAT) 0.4 MG SL tablet Place 1 tablet (0.4 mg total) under the tongue every 5 (five) minutes as needed for chest pain. 25 tablet 10   ofloxacin (FLOXIN) 0.3 % OTIC solution ofloxacin 0.3 % ear drops  INSTILL 5 DROPS IN EACH EAR TWICE A DAY AS NEEDED FOR EAR PAIN     oxybutynin (DITROPAN XL) 10 MG 24 hr tablet Take 1 tablet (10 mg total) by mouth at bedtime. 90 tablet 3   QUEtiapine (SEROQUEL) 25 MG tablet Take 1 tablet (25 mg total) by mouth at bedtime. 90 tablet 1   Semaglutide,0.25 or 0.5MG /DOS, (OZEMPIC, 0.25 OR 0.5 MG/DOSE,) 2 MG/1.5ML SOPN Inject 0.5 mg into the skin once a week. On Wednesday 1 mL 2   solifenacin (VESICARE) 10 MG tablet Take 1 tablet (10 mg total) by mouth daily. 90 tablet 1   guaiFENesin (MUCINEX) 600 MG 12 hr tablet Take 1-2 tablets (600-1,200 mg total) by mouth 2 (two) times daily as needed for cough or to loosen phlegm. 30 tablet 0   metoprolol tartrate (LOPRESSOR) 50 MG tablet Take 2 hours prior to Cardiac CT 1 tablet 0   triamcinolone (KENALOG) 0.1 % Apply 1 application topically 3 (three) times daily. 30 each 0   No facility-administered medications prior to visit.    Allergies  Allergen Reactions   Penicillins Hives and Itching    Has patient had a PCN reaction causing immediate rash, facial/tongue/throat swelling, SOB or lightheadedness with hypotension: Yes Has patient had a PCN reaction causing severe rash involving mucus membranes or skin necrosis: Unk Has patient had a PCN reaction that required hospitalization: No Has patient had a PCN reaction occurring within the last 10 years: No If all of the above answers are "NO", then may proceed with Cephalosporin use.  Has patient had a PCN reaction causing immediate rash,  facial/tongue/throat swelling, SOB or lightheadedness with hypotension: Yes Has patient had a PCN reaction causing severe rash involving mucus membranes or skin necrosis: No Has patient had a PCN reaction that required hospitalization No Has patient had a PCN reaction occurring within the last 10 years: No If all of the above answers are "NO", then may proceed with Cephalosporin use. Has patient had a PCN reaction causing immediate rash, facial/tongue/throat swelling, SOB or lightheadedness with hypotension: Yes Has patient had a PCN reaction causing severe rash involving mucus membranes or skin necrosis: Unk Has patient had a PCN reaction that required hospitalization: No Has patient had a PCN reaction occurring within the last 10 years: No If all of the above answers are "NO", then may proceed with Cephalosporin use. Has patient had a PCN reaction causing immediate rash, facial/tongue/throat swelling, SOB or lightheadedness with hypotension: Yes Has patient had a PCN reaction causing severe rash involving mucus membranes or skin necrosis: No Has patient had a PCN reaction that required hospitalization No Has patient had a PCN reaction occurring within the last 10 years: No If all of the above answers are "NO", then may proceed with Cephalosporin use. Has patient had a PCN reaction causing immediate rash, facial/tongue/throat swelling, SOB or lightheadedness with hypotension: Yes Has patient had a PCN reaction causing sev... (TRUNCATED)   Gabapentin Other (See Comments)    Causes a lot of lethargy at a higher dose Causes a lot of lethargy at a higher dose Causes a lot of lethargy at a higher dose   Lisinopril Cough    HAS A CHRONIC COUGH AND DID NOT NOTICE IMPROVEMENT OF COUGH WHEN LISINOPRIL  WAS STOPPED HAS A CHRONIC COUGH AND DID NOT NOTICE IMPROVEMENT OF COUGH WHEN LISINOPRIL WAS STOPPED HAS A CHRONIC COUGH AND DID NOT NOTICE IMPROVEMENT OF COUGH WHEN LISINOPRIL WAS STOPPED   Soap Rash     DIAL soap    Review of Systems  Constitutional: Negative.   HENT:  Positive for ear pain. Negative for sore throat.   Respiratory:  Negative for cough.   Gastrointestinal: Negative.  Negative for diarrhea.  Skin:  Negative for rash.  All other systems reviewed and are negative.     Objective:    Physical Exam Vitals and nursing note reviewed.  Constitutional:      Appearance: Normal appearance.  HENT:     Head: Normocephalic.     Right Ear: There is impacted cerumen.     Left Ear: There is impacted cerumen.     Nose: Nose normal.  Eyes:     Conjunctiva/sclera: Conjunctivae normal.  Cardiovascular:     Rate and Rhythm: Normal rate and regular rhythm.     Pulses: Normal pulses.     Heart sounds: Normal heart sounds.  Pulmonary:     Effort: Pulmonary effort is normal.     Breath sounds: Normal breath sounds.  Abdominal:     General: Bowel sounds are normal.  Skin:    Findings: No rash.  Neurological:     Mental Status: She is alert and oriented to person, place, and time.    There were no vitals taken for this visit. Wt Readings from Last 3 Encounters:  06/06/21 177 lb 3.2 oz (80.4 kg)  05/04/21 180 lb (81.6 kg)  02/28/21 176 lb (79.8 kg)    Health Maintenance Due  Topic Date Due   OPHTHALMOLOGY EXAM  08/26/2016   COLON CANCER SCREENING ANNUAL FOBT  12/04/2019   COVID-19 Vaccine (3 - Moderna risk series) 08/09/2020   FOOT EXAM  03/01/2021   URINE MICROALBUMIN  03/01/2021    There are no preventive care reminders to display for this patient.   Lab Results  Component Value Date   TSH 3.040 06/20/2018   Lab Results  Component Value Date   WBC 7.0 05/04/2021   HGB 13.8 05/04/2021   HCT 40.3 05/04/2021   MCV 84 05/04/2021   PLT 245 05/04/2021   Lab Results  Component Value Date   NA 141 05/04/2021   K 4.1 05/04/2021   CO2 24 05/04/2021   GLUCOSE 131 (H) 05/04/2021   BUN 14 05/04/2021   CREATININE 0.96 05/04/2021   BILITOT 0.3 05/04/2021    ALKPHOS 167 (H) 05/04/2021   AST 28 05/04/2021   ALT 38 (H) 05/04/2021   PROT 6.1 05/04/2021   ALBUMIN 4.0 05/04/2021   CALCIUM 9.3 05/04/2021   ANIONGAP 8 06/10/2019   EGFR 63 05/04/2021   Lab Results  Component Value Date   CHOL 122 05/04/2021   Lab Results  Component Value Date   HDL 50 05/04/2021   Lab Results  Component Value Date   LDLCALC 54 05/04/2021   Lab Results  Component Value Date   TRIG 92 05/04/2021   Lab Results  Component Value Date   CHOLHDL 2.4 05/04/2021   Lab Results  Component Value Date   HGBA1C 8.3 (H) 05/04/2021       Assessment & Plan:   Problem List Items Addressed This Visit       Other   Ear pain, right - Primary    Worsening ear pain after recent accident, patient has  worsening pain on the right side of the face radiating to the right ear and jawline.  On assessment he had canal swollen and impacted with cerumen with only small tip of ear tubes showing.  Patient rates pain moderate to severe.  Currently using ear softening once and optic drops with mild relief.  Advised patient to return to ENT as a follow-up is scheduled for 5 August.  Depo-Medrol shot given in clinic for pain and inflammation.  6-day prednisone taper.  Patient knows to follow-up with worsening or unresolved symptoms.  Rx sent to pharmacy.       Relevant Medications   predniSONE (STERAPRED UNI-PAK 21 TAB) 10 MG (21) TBPK tablet     Meds ordered this encounter  Medications   predniSONE (STERAPRED UNI-PAK 21 TAB) 10 MG (21) TBPK tablet    Sig: 6 tablet day 1, 5 tablet day 2, 4 tablet day 3, 3 tablet daily, 2 tablet day 5, 1 tablet day 6    Dispense:  1 each    Refill:  0    Order Specific Question:   Supervising Provider    Answer:   Janora Norlander [[id number]]   methylPREDNISolone acetate (DEPO-MEDROL) injection 40 mg     Ivy Lynn, NP

## 2021-06-14 DIAGNOSIS — H7202 Central perforation of tympanic membrane, left ear: Secondary | ICD-10-CM | POA: Diagnosis not present

## 2021-06-14 DIAGNOSIS — H903 Sensorineural hearing loss, bilateral: Secondary | ICD-10-CM | POA: Diagnosis not present

## 2021-06-14 DIAGNOSIS — H6121 Impacted cerumen, right ear: Secondary | ICD-10-CM | POA: Diagnosis not present

## 2021-06-14 DIAGNOSIS — H6982 Other specified disorders of Eustachian tube, left ear: Secondary | ICD-10-CM | POA: Diagnosis not present

## 2021-06-27 ENCOUNTER — Telehealth: Payer: Medicare HMO

## 2021-06-29 ENCOUNTER — Other Ambulatory Visit: Payer: Self-pay | Admitting: Family Medicine

## 2021-06-29 ENCOUNTER — Encounter: Payer: Self-pay | Admitting: Family Medicine

## 2021-06-29 ENCOUNTER — Ambulatory Visit (INDEPENDENT_AMBULATORY_CARE_PROVIDER_SITE_OTHER): Payer: Medicare HMO | Admitting: Family Medicine

## 2021-06-29 ENCOUNTER — Other Ambulatory Visit: Payer: Self-pay

## 2021-06-29 VITALS — BP 134/73 | HR 70 | Temp 97.7°F | Ht 62.0 in | Wt 176.0 lb

## 2021-06-29 DIAGNOSIS — H9201 Otalgia, right ear: Secondary | ICD-10-CM

## 2021-06-29 DIAGNOSIS — F411 Generalized anxiety disorder: Secondary | ICD-10-CM

## 2021-06-29 DIAGNOSIS — G44311 Acute post-traumatic headache, intractable: Secondary | ICD-10-CM | POA: Diagnosis not present

## 2021-06-29 MED ORDER — BUTALBITAL-APAP-CAFFEINE 50-300-40 MG PO CAPS: 1.0000 | ORAL_CAPSULE | ORAL | 1 refills | Status: DC | PRN

## 2021-06-29 NOTE — Progress Notes (Signed)
Subjective:  Patient ID: Yvonne [name], female    DOB: January 02, 1951  Age: 70 y.o. MRN: [id number]  CC: No chief complaint on file.   HPI Yvonne [name] presents for headache at the right frontal area behind the wound scar Agitated as a result - see PHQ. d\Feels the sx will clear if she can get pain relief. Daily HA preventing her from reading books, which she normally does daily. Sometimes 3 a day. Now can't concentrate to read one. Some  8/10 right frontal radiating to right temple.  Depression screen [facility] 2/9 06/29/2021 06/29/2021 06/13/2021  Decreased Interest 2 0 2  Down, Depressed, Hopeless 2 0 2  PHQ - 2 Score 4 0 4  Altered sleeping 3 - 3  Tired, decreased energy 1 - 1  Change in appetite 3 - 3  Feeling bad or failure about yourself  3 - 2  Trouble concentrating 3 - 3  Moving slowly or fidgety/restless 3 - 2  Suicidal thoughts 0 - -  PHQ-9 Score 20 - 18  Difficult doing work/chores Somewhat difficult - Somewhat difficult  Some recent data might be hidden    History Yvonne [name] has a past medical history of Anxiety, Arthritis, Asthma, Chronic cough, Chronic otitis media (12/2017), Full dentures, GERD (gastroesophageal reflux disease), History of stroke (09/2017), Hyperlipidemia, Hypertension, Non-insulin dependent type 2 diabetes mellitus ([place]), Overactive bladder, Recurrent acute suppurative otitis media without spontaneous rupture of left tympanic membrane (02/19/2018), and Transient cerebral ischemia.   She has a past surgical history that includes Cholecystectomy; Colonoscopy (N/A, 08/17/2015); Abdominal hysterectomy; Lacrimal duct exploration (Bilateral); Cataract extraction w/ intraocular lens implant (Right); Vitrectomy (Left, 09/14/2015); Gas insertion (Right); and Myringotomy with tube placement (Bilateral, 01/28/2018).   Her family history includes Arthritis in her brother and mother; Arthritis-Osteo in her son; Asthma in her father; CVA in her mother; Cancer in her brother; Diabetes in  her father and mother; Heart disease in her father; Hepatitis C in her sister; Peripheral Artery Disease in her daughter; Stroke in her mother.She reports that she has never smoked. She has never used smokeless tobacco. She reports that she does not drink alcohol and does not use drugs.    ROS Review of Systems  Constitutional: Negative.   HENT: Negative.    Eyes:  Negative for visual disturbance.  Respiratory:  Negative for shortness of breath.   Cardiovascular:  Negative for chest pain.  Gastrointestinal:  Negative for abdominal pain.  Musculoskeletal:  Negative for arthralgias.  Neurological:  Positive for headaches.  Psychiatric/Behavioral:  Positive for dysphoric mood and sleep disturbance.    Objective:  BP 134/73   Pulse 70   Temp 97.7 F (36.5 C)   Ht 5' 2" (1.575 m)   Wt 176 lb (79.8 kg)   SpO2 98%   BMI 32.19 kg/m   BP Readings from Last 3 Encounters:  06/29/21 134/73  06/06/21 124/73  05/04/21 115/76    Wt Readings from Last 3 Encounters:  06/29/21 176 lb (79.8 kg)  06/06/21 177 lb 3.2 oz (80.4 kg)  05/04/21 180 lb (81.6 kg)     Physical Exam Constitutional:      General: She is not in acute distress.    Appearance: She is well-developed.  Cardiovascular:     Rate and Rhythm: Normal rate and regular rhythm.  Pulmonary:     Breath sounds: Normal breath sounds.  Musculoskeletal:        General: Normal range of motion.  Skin:    General:  Skin is warm and dry.  Neurological:     Mental Status: She is alert and oriented to person, place, and time.     Cranial Nerves: No cranial nerve deficit.     Sensory: No sensory deficit.     Motor: No weakness.     Coordination: Coordination normal.     Gait: Gait normal.      Assessment & Plan:   Diagnoses and all orders for this visit:  GAD (generalized anxiety disorder)  Intractable acute post-traumatic headache  Other orders -     Discontinue: Butalbital-APAP-Caffeine (FIORICET) 50-300-40 MG CAPS;  Take 1 capsule by mouth every 4 (four) hours as needed (headache).      I am having Yvonne [name] maintain her loratadine, aspirin, Accu-Chek Softclix Lancets, solifenacin, furosemide, Breo Ellipta, ezetimibe, clopidogrel, glipiZIDE, Ozempic (0.25 or 0.5 MG/DOSE), QUEtiapine, atorvastatin, nitroGLYCERIN, dapagliflozin propanediol, famotidine, ofloxacin, Accu-Chek Guide, alendronate, Accu-Chek Guide, amLODipine, ALPRAZolam, albuterol, oxybutynin, and predniSONE.  Allergies as of 06/29/2021       Reactions   Penicillins Hives, Itching   Has patient had a PCN reaction causing immediate rash, facial/tongue/throat swelling, SOB or lightheadedness with hypotension: Yes Has patient had a PCN reaction causing severe rash involving mucus membranes or skin necrosis: Unk Has patient had a PCN reaction that required hospitalization: No Has patient had a PCN reaction occurring within the last 10 years: No If all of the above answers are "NO", then may proceed with Cephalosporin use. Has patient had a PCN reaction causing immediate rash, facial/tongue/throat swelling, SOB or lightheadedness with hypotension: Yes Has patient had a PCN reaction causing severe rash involving mucus membranes or skin necrosis: No Has patient had a PCN reaction that required hospitalization No Has patient had a PCN reaction occurring within the last 10 years: No If all of the above answers are "NO", then may proceed with Cephalosporin use. Has patient had a PCN reaction causing immediate rash, facial/tongue/throat swelling, SOB or lightheadedness with hypotension: Yes Has patient had a PCN reaction causing severe rash involving mucus membranes or skin necrosis: Unk Has patient had a PCN reaction that required hospitalization: No Has patient had a PCN reaction occurring within the last 10 years: No If all of the above answers are "NO", then may proceed with Cephalosporin use. Has patient had a PCN reaction causing immediate  rash, facial/tongue/throat swelling, SOB or lightheadedness with hypotension: Yes Has patient had a PCN reaction causing severe rash involving mucus membranes or skin necrosis: No Has patient had a PCN reaction that required hospitalization No Has patient had a PCN reaction occurring within the last 10 years: No If all of the above answers are "NO", then may proceed with Cephalosporin use. Has patient had a PCN reaction causing immediate rash, facial/tongue/throat swelling, SOB or lightheadedness with hypotension: Yes Has patient had a PCN reaction causing sev... (TRUNCATED)   Gabapentin Other (See Comments)   Causes a lot of lethargy at a higher dose Causes a lot of lethargy at a higher dose Causes a lot of lethargy at a higher dose   Lisinopril Cough   HAS A CHRONIC COUGH AND DID NOT NOTICE IMPROVEMENT OF COUGH WHEN LISINOPRIL WAS STOPPED HAS A CHRONIC COUGH AND DID NOT NOTICE IMPROVEMENT OF COUGH WHEN LISINOPRIL WAS STOPPED HAS A CHRONIC COUGH AND DID NOT NOTICE IMPROVEMENT OF COUGH WHEN LISINOPRIL WAS STOPPED   Soap Rash   DIAL soap        Medication List          Accurate as of June 29, 2021 11:59 PM. If you have any questions, ask your nurse or doctor.          Accu-Chek Guide test strip Generic drug: glucose blood TEST BLOOD SUGAR 4 TIMES DAILY Dx E11.69   Accu-Chek Guide w/Device Kit Test Bs QID Dx E11.69   Accu-Chek Softclix Lancets lancets Test BS up to 4 times daily Dx E11.69   albuterol 108 (90 Base) MCG/ACT inhaler Commonly known as: VENTOLIN HFA TAKE 2 PUFFS BY MOUTH EVERY 6 HOURS AS NEEDED FOR WHEEZE OR SHORTNESS OF BREATH   alendronate 70 MG tablet Commonly known as: FOSAMAX Take 1 tablet (70 mg total) by mouth every 7 (seven) days. Take with a full glass of water on an empty stomach.   ALPRAZolam 1 MG tablet Commonly known as: XANAX ! Each morning, one each evening, and 1/2 each afternoon   amLODipine 10 MG tablet Commonly known as: NORVASC TAKE  1 TABLET BY MOUTH EVERY DAY   aspirin 325 MG tablet Take 325 mg by mouth daily.   atorvastatin 80 MG tablet Commonly known as: LIPITOR Take 1 tablet (80 mg total) by mouth daily.   Breo Ellipta 100-25 MCG/INH Aepb Generic drug: fluticasone furoate-vilanterol Inhale 1 puff into the lungs daily.   butalbital-acetaminophen-caffeine 50-325-40 MG tablet Commonly known as: FIORICET Take 1 tablet by mouth every 4 (four) hours as needed for headache. Started by:  , MD   clopidogrel 75 MG tablet Commonly known as: PLAVIX Take 1 tablet (75 mg total) by mouth daily with breakfast.   dapagliflozin propanediol 10 MG Tabs tablet Commonly known as: FARXIGA Take 10 mg by mouth daily.   ezetimibe 10 MG tablet Commonly known as: Zetia Take 1 tablet (10 mg total) by mouth daily. For cholesterol   famotidine 20 MG tablet Commonly known as: PEPCID famotidine 20 mg tablet  TAKE 1 TABLET BY MOUTH AT BEDTIME   furosemide 20 MG tablet Commonly known as: LASIX Take 1 tablet (20 mg total) by mouth daily.   glipiZIDE 5 MG tablet Commonly known as: GLUCOTROL Take 1 tablet (5 mg total) by mouth daily before breakfast.   loratadine 10 MG tablet Commonly known as: CLARITIN Take 1 tablet (10 mg total) by mouth daily.   nitroGLYCERIN 0.4 MG SL tablet Commonly known as: NITROSTAT Place 1 tablet (0.4 mg total) under the tongue every 5 (five) minutes as needed for chest pain.   ofloxacin 0.3 % OTIC solution Commonly known as: FLOXIN ofloxacin 0.3 % ear drops  INSTILL 5 DROPS IN EACH EAR TWICE A DAY AS NEEDED FOR EAR PAIN   oxybutynin 10 MG 24 hr tablet Commonly known as: Ditropan XL Take 1 tablet (10 mg total) by mouth at bedtime.   Ozempic (0.25 or 0.5 MG/DOSE) 2 MG/1.5ML Sopn Generic drug: Semaglutide(0.25 or 0.5MG/DOS) Inject 0.5 mg into the skin once a week. On Wednesday   predniSONE 10 MG (21) Tbpk tablet Commonly known as: STERAPRED UNI-PAK 21 TAB 6 tablet day 1, 5  tablet day 2, 4 tablet day 3, 3 tablet daily, 2 tablet day 5, 1 tablet day 6   QUEtiapine 25 MG tablet Commonly known as: SEROQUEL Take 1 tablet (25 mg total) by mouth at bedtime.   solifenacin 10 MG tablet Commonly known as: VESIcare Take 1 tablet (10 mg total) by mouth daily.         Follow-up: Return in about 6 weeks (around 08/10/2021).   , M.D.  

## 2021-06-30 ENCOUNTER — Other Ambulatory Visit: Payer: Self-pay | Admitting: Family Medicine

## 2021-06-30 ENCOUNTER — Encounter: Payer: Self-pay | Admitting: Family Medicine

## 2021-06-30 MED ORDER — BUTALBITAL-APAP-CAFFEINE 50-325-40 MG PO TABS: 1.0000 | ORAL_TABLET | ORAL | 0 refills | Status: DC | PRN

## 2021-07-03 NOTE — Progress Notes (Deleted)
Cardiology Office Note    Date:  07/03/2021   ID:  JULIEANN HERKO, DOB 1951-05-23, MRN [id number]   PCP:  [name], [place]  Cardiologist:  None *** Advanced Practice Provider:  No care team member to display Electrophysiologist:  None   [id number]   No chief complaint on file.   History of Present Illness:  Yvonne [name] is a 70 y.o. female with a hx of HTN with DM orthostatic hypotension, HFpEF, Chronic Migraine, GERD, HLD with DM and Aortic atherosclerosis, stoke with prior lacunar infarcts 01/03/21, CKD IIIa.  Patient saw Dr. Gasper Sells 12/2020 with chest pain and recommended coronary CTA calcium score 736 with moderate stenosis in the LAD but FFR was negative for stenosis.  Also has history of orthostatic hypotension but was asymptomatic at office visit    Past Medical History:  Diagnosis Date   Anxiety    Arthritis    left hand   Asthma    daily and prn inhalers   Chronic cough    Chronic otitis media 12/2017   Full dentures    GERD (gastroesophageal reflux disease)    History of stroke 09/2017   weakness right hand, numbness right side face   Hyperlipidemia    Hypertension    states under control with meds., has been on med. x a long time, per pt.   Non-insulin dependent type 2 diabetes mellitus (HCC)    Overactive bladder    Recurrent acute suppurative otitis media without spontaneous rupture of left tympanic membrane 02/19/2018   Transient cerebral ischemia     Past Surgical History:  Procedure Laterality Date   ABDOMINAL HYSTERECTOMY     complete   CATARACT EXTRACTION W/ INTRAOCULAR LENS IMPLANT Right    CHOLECYSTECTOMY     COLONOSCOPY N/A 08/17/2015   Procedure: COLONOSCOPY;  Surgeon: Rogene Houston, MD;  [facility];  Service: Endoscopy;  Laterality: N/A;  200 - moved to 7:30 - Ann notified pt   GAS INSERTION Right    x 2 - eye   LACRIMAL DUCT EXPLORATION Bilateral    removal of tear ducts    MYRINGOTOMY WITH TUBE PLACEMENT Bilateral 01/28/2018   Procedure: BILATERAL MYRINGOTOMY WITH TUBE PLACEMENT;  Surgeon: Leta Baptist, MD;  Location: [facility];  Service: ENT;  Laterality: Bilateral;   VITRECTOMY Left 09/14/2015    Current Medications: No outpatient medications have been marked as taking for the 07/12/21 encounter (Appointment) with Imogene Burn, PA-C.     Allergies:   Penicillins, Gabapentin, Lisinopril, and Soap   Social History   Socioeconomic History   Marital status: Married    Spouse name: Not on file   Number of children: 3   Years of education: Not on file   Highest education level: GED or equivalent  Occupational History   Occupation: retired    Comment: Customer service manager and restaurants  Tobacco Use   Smoking status: Never   Smokeless tobacco: Never  Scientific laboratory technician Use: Never used  Substance and Sexual Activity   Alcohol use: No   Drug use: No   Sexual activity: Yes  Other Topics Concern   Not on file  Social History Narrative   Lives at home home with husband with son, daughter in law and 3 children.  [name], his girlfriend and her child also moved in 10/2018      Disabled.   Social Determinants of Health   Financial  Resource Strain: Low Risk    Difficulty of Paying Living Expenses: Not hard at all  Food Insecurity: No Food Insecurity   Worried About Charity fundraiser in the Last Year: Never true   Ran Out of Food in the Last Year: Never true  Transportation Needs: No Transportation Needs   Lack of Transportation (Medical): No   Lack of Transportation (Non-Medical): No  Physical Activity: Sufficiently Active   Days of Exercise per Week: 7 days   Minutes of Exercise per Session: 50 min  Stress: Stress Concern Present   Feeling of Stress : Very much  Social Connections: Moderately Isolated   Frequency of Communication with Friends and Family: More than three times a week   Frequency of Social Gatherings with Friends  and Family: More than three times a week   Attends Religious Services: Never   Marine scientist or Organizations: No   Attends Music therapist: Never   Marital Status: Married     Family History:  The patient's ***family history includes Arthritis in her brother and mother; Arthritis-Osteo in her son; Asthma in her father; CVA in her mother; Cancer in her brother; Diabetes in her father and mother; Heart disease in her father; Hepatitis C in her sister; Peripheral Artery Disease in her daughter; Stroke in her mother.   ROS:   Please see the history of present illness.    ROS All other systems reviewed and are negative.   PHYSICAL EXAM:   VS:  There were no vitals taken for this visit.  Physical Exam  GEN: Well nourished, well developed, in no acute distress  HEENT: normal  Neck: no JVD, carotid bruits, or masses Cardiac:RRR; no murmurs, rubs, or gallops  Respiratory:  clear to auscultation bilaterally, normal work of breathing GI: soft, nontender, nondistended, + BS Ext: without cyanosis, clubbing, or edema, Good distal pulses bilaterally MS: no deformity or atrophy  Skin: warm and dry, no rash Neuro:  Alert and Oriented x 3, Strength and sensation are intact Psych: euthymic mood, full affect  Wt Readings from Last 3 Encounters:  06/29/21 176 lb (79.8 kg)  06/06/21 177 lb 3.2 oz (80.4 kg)  05/04/21 180 lb (81.6 kg)      Studies/Labs Reviewed:   EKG:  EKG is*** ordered today.  The ekg ordered today demonstrates ***  Recent Labs: 05/04/2021: ALT 38; BUN 14; Creatinine, Ser 0.96; Hemoglobin 13.8; Platelets 245; Potassium 4.1; Sodium 141   Lipid Panel    Component Value Date/Time   CHOL 122 05/04/2021 1041   TRIG 92 05/04/2021 1041   HDL 50 05/04/2021 1041   CHOLHDL 2.4 05/04/2021 1041   CHOLHDL 5.7 06/11/2019 0622   VLDL 39 06/11/2019 0622   LDLCALC 54 05/04/2021 1041    Additional studies/ records that were reviewed today include:  Coronary CTA  and FFR 01/2021 Coronary calcium score of 736. This was 94th percentile for age, sex, and race matched control.   RCA is a large dominant artery that gives rise to PDA and PLA. There is a mild non-obstructive (25-49%) calcified plaque in the ostium of the vessel. There are mild non-obstructive (25-49%) mixed plaques scattered in the proximal and mid vessel. There are two minimal non-obstructive (1-24%) calcified plaques in the mid and distal vessel.   Left main is a large artery that gives rise to LAD and LCX arteries. There is a minimal (< 10%) ostial calcified plaque.   LAD is a large vessel that gives rise  to one large D1 Branch that bifurcates and a secondary D2 vessel. There is a mild non-obstructive (25-49%) calcified plaque in the proximal vessel. There is a moderate stenosis (50-70%) mixed and calcified plaque proximal-mid vessel- there is a napkin ring's sign. There is a mild non-obstructive (25-49%) calcified plaque in the mid vessel after the moderate stenosis. There is a minimal non-obstructive (1-24%) calcified plaque in the D2 vessel.   LCX is a non-dominant artery that gives rise to one small OM1 vessel. There is no plaque.   Other findings:   Normal pulmonary vein drainage into the left atrium.   Normal left atrial appendage without a thrombus.   Normal size of the pulmonary artery.   Extra-cardiac findings: See attached radiology report for non-cardiac structures.   IMPRESSION: 1. Coronary calcium score of 736. This was 94th percentile for age, sex, and race matched control.   2. Normal coronary origin.  Right dominance.   3. CAD-RADS 3. Moderate stenosis. Consider symptom-guided anti-ischemic pharmacotherapy as well as risk factor modification per guideline directed care. Additional analysis with CT FFR will be submitted.   4.  Aortic atherosclerosis noted.     Electronically Signed   By: Rudean Haskell MD   On: 02/09/2021  15:44 FINDINGS: CT-FFR analysis was performed on the original cardiac CT angiogram dataset. Diagrammatic representation of the CT-FFR analysis is provided in a separate PDF document in PACS. This dictation was created using the PDF document and an interactive 3D model of the results. 3D model is not available in the EMR/PACS. Normal FFR range is >0.80.   1. Left Main: No significant functional stenosis, CT-FFR 0.99.   2. LAD: No significant functional stenosis, CT-FFR 0.87 distal LAD, except for distal tip CT-FFR 0.79. 3. LCX: No significant functional stenosis, CT-FFR 0.98 distal vessel. 4. RCA: No significant functional stenosis, CT-FFR 0.91 distal vessel.   IMPRESSION: 1. CT FFR analysis shows evidence of significant functional stenosis only at the distal tip of the LAD.   Rudean Haskell MD     Monitor 01/2021  Patient had a minimum heart rate of 53 bpm, maximum heart rate of 160 bpm, and average heart rate of 73 bpm. Predominant underlying rhythm was sinus rhythm. Six runs of suprventricular tachycardia occurred lasting 7 beats at longest with a max rate of 160 bpm at fastest. Isolated PACs were rare (<1.0%), with rare couplets and triplets present. Isolated PVCs were rare (<1.0%). No evidence of complete heart block. Triggered and diary events associated with sinus rhythm.      Risk Assessment/Calculations:   {Does this patient have ATRIAL FIBRILLATION?:[phone]}     ASSESSMENT:    No diagnosis found.   PLAN:  In order of problems listed above:   Chest pain coronary CTA 01/2021 calcium score 736 moderate disease in the LAD but FFR no significant stenosis.  Medical management  History of CVA  History of orthostatic hypotension  Palpitations monitor 01/2021 6 runs of SVT longest 7 beats.  No A. Fib  Hypertension  HLD  Shared Decision Making/Informed Consent   {Are you ordering a CV Procedure (e.g. stress test, cath, DCCV, TEE, etc)?   Press F2         :[id number]    Medication Adjustments/Labs and Tests Ordered: Current medicines are reviewed at length with the patient today.  Concerns regarding medicines are outlined above.  Medication changes, Labs and Tests ordered today are listed in the Patient Instructions below. There are no Patient Instructions on file for  this visit.   Sumner Boast, PA-C  07/03/2021 12:50 PM    [facility] [address], [place],   [address] Phone: [phone]; Fax: [phone]

## 2021-07-12 ENCOUNTER — Ambulatory Visit: Payer: Medicare HMO | Admitting: Physician Assistant

## 2021-07-12 ENCOUNTER — Ambulatory Visit (INDEPENDENT_AMBULATORY_CARE_PROVIDER_SITE_OTHER): Payer: Medicare HMO | Admitting: Family Medicine

## 2021-07-12 ENCOUNTER — Encounter: Payer: Self-pay | Admitting: Family Medicine

## 2021-07-12 ENCOUNTER — Other Ambulatory Visit: Payer: Self-pay

## 2021-07-12 VITALS — BP 125/69 | HR 88 | Temp 97.8°F | Ht 62.0 in | Wt 176.6 lb

## 2021-07-12 DIAGNOSIS — G44321 Chronic post-traumatic headache, intractable: Secondary | ICD-10-CM

## 2021-07-12 DIAGNOSIS — G43709 Chronic migraine without aura, not intractable, without status migrainosus: Secondary | ICD-10-CM | POA: Diagnosis not present

## 2021-07-12 MED ORDER — BUTALBITAL-APAP-CAFFEINE 50-325-40 MG PO TABS: 1.0000 | ORAL_TABLET | Freq: Four times a day (QID) | ORAL | 0 refills | Status: DC | PRN

## 2021-07-12 NOTE — Progress Notes (Signed)
Subjective:  Patient ID: Yvonne [name], female    DOB: 08-Jun-1951  Age: 70 y.o. MRN: [id number]  CC: Headache   HPI Yvonne [name] presents for throbbing HA since fall on June 27. Pain is where she had stitches. Hurts more with bending over. 7+/10 on pain scale. No vision or hearing change. Can't bounce back.     History Yvonne [name] has a past medical history of Anxiety, Arthritis, Asthma, Chronic cough, Chronic otitis media (12/2017), Full dentures, GERD (gastroesophageal reflux disease), History of stroke (09/2017), Hyperlipidemia, Hypertension, Non-insulin dependent type 2 diabetes mellitus ([place]), Overactive bladder, Recurrent acute suppurative otitis media without spontaneous rupture of left tympanic membrane (02/19/2018), and Transient cerebral ischemia.   She has a past surgical history that includes Cholecystectomy; Colonoscopy (N/A, 08/17/2015); Abdominal hysterectomy; Lacrimal duct exploration (Bilateral); Cataract extraction w/ intraocular lens implant (Right); Vitrectomy (Left, 09/14/2015); Gas insertion (Right); and Myringotomy with tube placement (Bilateral, 01/28/2018).   Her family history includes Arthritis in her brother and mother; Arthritis-Osteo in her son; Asthma in her father; CVA in her mother; Cancer in her brother; Diabetes in her father and mother; Heart disease in her father; Hepatitis C in her sister; Peripheral Artery Disease in her daughter; Stroke in her mother.She reports that she has never smoked. She has never used smokeless tobacco. She reports that she does not drink alcohol and does not use drugs.    ROS Review of Systems  Constitutional: Negative.   HENT: Negative.    Eyes:  Negative for visual disturbance.  Respiratory:  Negative for shortness of breath.   Cardiovascular:  Negative for chest pain.  Gastrointestinal:  Negative for abdominal pain.  Musculoskeletal:  Negative for arthralgias.  Neurological:  Positive for headaches.   Objective:  BP 125/69    Pulse 88   Temp 97.8 F (36.6 C)   Ht _0  (1.575 m)   Wt 176 lb 9.6 oz (80.1 kg)   SpO2 96%   BMI 32.30 kg/m   BP Readings from Last 3 Encounters:  07/12/21 125/69  06/29/21 134/73  06/06/21 124/73    Wt Readings from Last 3 Encounters:  07/12/21 176 lb 9.6 oz (80.1 kg)  06/29/21 176 lb (79.8 kg)  06/06/21 177 lb 3.2 oz (80.4 kg)     Physical Exam Constitutional:      General: She is not in acute distress.    Appearance: She is well-developed.  Cardiovascular:     Rate and Rhythm: Normal rate and regular rhythm.  Pulmonary:     Breath sounds: Normal breath sounds.  Musculoskeletal:        General: Normal range of motion.  Skin:    General: Skin is warm and dry.  Neurological:     Mental Status: She is alert and oriented to person, place, and time.      Assessment & Plan:   Amariya was seen today for headache.  Diagnoses and all orders for this visit:  Intractable chronic post-traumatic headache  Chronic migraine without aura without status migrainosus, not intractable  Other orders -     butalbital-acetaminophen-caffeine (FIORICET) 50-325-40 MG tablet; Take 1 tablet by mouth every 6 (six) hours as needed for headache.      I have discontinued Yvonne Hong. [name] solifenacin, oxybutynin, predniSONE, and butalbital-acetaminophen-caffeine. I am also having her start on butalbital-acetaminophen-caffeine. Additionally, I am having her maintain her loratadine, aspirin, Accu-Chek Softclix Lancets, furosemide, Breo Ellipta, ezetimibe, clopidogrel, glipiZIDE, Ozempic (0.25 or 0.5 MG/DOSE), QUEtiapine, atorvastatin, nitroGLYCERIN, dapagliflozin propanediol, famotidine,  ofloxacin, Accu-Chek Guide, alendronate, Accu-Chek Guide, amLODipine, ALPRAZolam, and albuterol.  Allergies as of 07/12/2021       Reactions   Penicillins Hives, Itching   Has patient had a PCN reaction causing immediate rash, facial/tongue/throat swelling, SOB or lightheadedness with hypotension:  Yes Has patient had a PCN reaction causing severe rash involving mucus membranes or skin necrosis: Unk Has patient had a PCN reaction that required hospitalization: No Has patient had a PCN reaction occurring within the last 10 years: No If all of the above answers are "NO", then may proceed with Cephalosporin use. Has patient had a PCN reaction causing immediate rash, facial/tongue/throat swelling, SOB or lightheadedness with hypotension: Yes Has patient had a PCN reaction causing severe rash involving mucus membranes or skin necrosis: No Has patient had a PCN reaction that required hospitalization No Has patient had a PCN reaction occurring within the last 10 years: No If all of the above answers are "NO", then may proceed with Cephalosporin use. Has patient had a PCN reaction causing immediate rash, facial/tongue/throat swelling, SOB or lightheadedness with hypotension: Yes Has patient had a PCN reaction causing severe rash involving mucus membranes or skin necrosis: Unk Has patient had a PCN reaction that required hospitalization: No Has patient had a PCN reaction occurring within the last 10 years: No If all of the above answers are "NO", then may proceed with Cephalosporin use. Has patient had a PCN reaction causing immediate rash, facial/tongue/throat swelling, SOB or lightheadedness with hypotension: Yes Has patient had a PCN reaction causing severe rash involving mucus membranes or skin necrosis: No Has patient had a PCN reaction that required hospitalization No Has patient had a PCN reaction occurring within the last 10 years: No If all of the above answers are "NO", then may proceed with Cephalosporin use. Has patient had a PCN reaction causing immediate rash, facial/tongue/throat swelling, SOB or lightheadedness with hypotension: Yes Has patient had a PCN reaction causing sev... (TRUNCATED)   Gabapentin Other (See Comments)   Causes a lot of lethargy at a higher dose Causes a lot  of lethargy at a higher dose Causes a lot of lethargy at a higher dose   Lisinopril Cough   HAS A CHRONIC COUGH AND DID NOT NOTICE IMPROVEMENT OF COUGH WHEN LISINOPRIL WAS STOPPED HAS A CHRONIC COUGH AND DID NOT NOTICE IMPROVEMENT OF COUGH WHEN LISINOPRIL WAS STOPPED HAS A CHRONIC COUGH AND DID NOT NOTICE IMPROVEMENT OF COUGH WHEN LISINOPRIL WAS STOPPED   Soap Rash   DIAL soap        Medication List        Accurate as of July 12, 2021 11:59 PM. If you have any questions, ask your nurse or doctor.          STOP taking these medications    oxybutynin 10 MG 24 hr tablet Commonly known as: Ditropan XL Stopped by: [name], MD   predniSONE 10 MG (21) Tbpk tablet Commonly known as: STERAPRED UNI-PAK 21 TAB Stopped by: [name], MD   solifenacin 10 MG tablet Commonly known as: VESIcare Stopped by: [name], MD       TAKE these medications    Accu-Chek Guide test strip Generic drug: glucose blood TEST BLOOD SUGAR 4 TIMES DAILY Dx E11.69   Accu-Chek Guide w/Device Kit Test Bs QID Dx E11.69   Accu-Chek Softclix Lancets lancets Test BS up to 4 times daily Dx E11.69   albuterol 108 (90 Base) MCG/ACT inhaler Commonly known as: VENTOLIN HFA TAKE  2 PUFFS BY MOUTH EVERY 6 HOURS AS NEEDED FOR WHEEZE OR SHORTNESS OF BREATH   alendronate 70 MG tablet Commonly known as: FOSAMAX Take 1 tablet (70 mg total) by mouth every 7 (seven) days. Take with a full glass of water on an empty stomach.   ALPRAZolam 1 MG tablet Commonly known as: XANAX ! Each morning, one each evening, and 1/2 each afternoon   amLODipine 10 MG tablet Commonly known as: NORVASC TAKE 1 TABLET BY MOUTH EVERY DAY   aspirin 325 MG tablet Take 325 mg by mouth daily.   atorvastatin 80 MG tablet Commonly known as: LIPITOR Take 1 tablet (80 mg total) by mouth daily.   Breo Ellipta 100-25 MCG/INH Aepb Generic drug: fluticasone furoate-vilanterol Inhale 1 puff into the lungs daily.    butalbital-acetaminophen-caffeine 50-325-40 MG tablet Commonly known as: FIORICET Take 1 tablet by mouth every 6 (six) hours as needed for headache. What changed: when to take this Changed by: [name], MD   clopidogrel 75 MG tablet Commonly known as: PLAVIX Take 1 tablet (75 mg total) by mouth daily with breakfast.   dapagliflozin propanediol 10 MG Tabs tablet Commonly known as: FARXIGA Take 10 mg by mouth daily.   ezetimibe 10 MG tablet Commonly known as: Zetia Take 1 tablet (10 mg total) by mouth daily. For cholesterol   famotidine 20 MG tablet Commonly known as: PEPCID famotidine 20 mg tablet  TAKE 1 TABLET BY MOUTH AT BEDTIME   furosemide 20 MG tablet Commonly known as: LASIX Take 1 tablet (20 mg total) by mouth daily.   glipiZIDE 5 MG tablet Commonly known as: GLUCOTROL Take 1 tablet (5 mg total) by mouth daily before breakfast.   loratadine 10 MG tablet Commonly known as: CLARITIN Take 1 tablet (10 mg total) by mouth daily.   nitroGLYCERIN 0.4 MG SL tablet Commonly known as: NITROSTAT Place 1 tablet (0.4 mg total) under the tongue every 5 (five) minutes as needed for chest pain.   ofloxacin 0.3 % OTIC solution Commonly known as: FLOXIN ofloxacin 0.3 % ear drops  INSTILL 5 DROPS IN EACH EAR TWICE A DAY AS NEEDED FOR EAR PAIN   Ozempic (0.25 or 0.5 MG/DOSE) 2 MG/1.5ML Sopn Generic drug: Semaglutide(0.25 or 0.5MG/DOS) Inject 0.5 mg into the skin once a week. On Wednesday   QUEtiapine 25 MG tablet Commonly known as: SEROQUEL Take 1 tablet (25 mg total) by mouth at bedtime.         Follow-up: Return in about 2 weeks (around 07/26/2021), or if symptoms worsen or fail to improve.  [name], M.D.

## 2021-07-15 ENCOUNTER — Other Ambulatory Visit: Payer: Self-pay | Admitting: Family Medicine

## 2021-07-16 ENCOUNTER — Encounter: Payer: Self-pay | Admitting: Family Medicine

## 2021-07-24 ENCOUNTER — Other Ambulatory Visit: Payer: Self-pay | Admitting: Family Medicine

## 2021-07-24 DIAGNOSIS — E785 Hyperlipidemia, unspecified: Secondary | ICD-10-CM

## 2021-07-24 DIAGNOSIS — E1122 Type 2 diabetes mellitus with diabetic chronic kidney disease: Secondary | ICD-10-CM

## 2021-07-24 DIAGNOSIS — N1831 Chronic kidney disease, stage 3a: Secondary | ICD-10-CM

## 2021-07-27 ENCOUNTER — Ambulatory Visit (INDEPENDENT_AMBULATORY_CARE_PROVIDER_SITE_OTHER): Payer: Medicare HMO | Admitting: Family Medicine

## 2021-07-27 DIAGNOSIS — G44319 Acute post-traumatic headache, not intractable: Secondary | ICD-10-CM | POA: Diagnosis not present

## 2021-07-27 MED ORDER — BUTALBITAL-APAP-CAFFEINE 50-325-40 MG PO TABS: 1.0000 | ORAL_TABLET | Freq: Four times a day (QID) | ORAL | 0 refills | Status: DC | PRN

## 2021-07-27 NOTE — Progress Notes (Signed)
    Subjective:    Patient ID: Yvonne [name], female    DOB: Apr 20, 1951, 70 y.o.   MRN: [id number]   HPI: Yvonne [name] is a 70 y.o. female presenting for continued HA at right Forehead where she had stitches. Sharp pains intermittently with a nagging ache. Pain is 7-8/10  when she takes the fioricet. "Not every day." The baseline ache is a 3/10. No focal neuro sx associated. No GI sx associated.  Depression screen [facility] 2/9 07/12/2021 07/12/2021 06/29/2021 06/29/2021 06/13/2021  Decreased Interest 2 0 2 0 2  Down, Depressed, Hopeless 2 0 2 0 2  PHQ - 2 Score 4 0 4 0 4  Altered sleeping 3 - 3 - 3  Tired, decreased energy 3 - 1 - 1  Change in appetite 3 - 3 - 3  Feeling bad or failure about yourself  3 - 3 - 2  Trouble concentrating 2 - 3 - 3  Moving slowly or fidgety/restless 1 - 3 - 2  Suicidal thoughts 0 - 0 - -  PHQ-9 Score 19 - 20 - 18  Difficult doing work/chores Somewhat difficult - Somewhat difficult - Somewhat difficult  Some recent data might be hidden     Relevant past medical, surgical, family and social history reviewed and updated as indicated.  Interim medical history since our last visit reviewed. Allergies and medications reviewed and updated.  ROS:  Review of Systems   Social History   Tobacco Use  Smoking Status Never  Smokeless Tobacco Never       Objective:     Wt Readings from Last 3 Encounters:  07/12/21 176 lb 9.6 oz (80.1 kg)  06/29/21 176 lb (79.8 kg)  06/06/21 177 lb 3.2 oz (80.4 kg)     Exam deferred. Pt. Harboring due to COVID 19. Phone visit performed.   Assessment & Plan:   1. Acute post-traumatic headache, not intractable     No orders of the defined types were placed in this encounter.   No orders of the defined types were placed in this encounter.     Diagnoses and all orders for this visit:  Acute post-traumatic headache, not intractable   Virtual Visit via telephone Note  I discussed the limitations, risks, security and  privacy concerns of performing an evaluation and management service by telephone and the availability of in person appointments. The patient was identified with two identifiers. Pt.expressed understanding and agreed to proceed. Pt. Is at home. Dr. [name] is in his office.  Follow Up Instructions:   I discussed the assessment and treatment plan with the patient. The patient was provided an opportunity to ask questions and all were answered. The patient agreed with the plan and demonstrated an understanding of the instructions.   The patient was advised to call back or seek an in-person evaluation if the symptoms worsen or if the condition fails to improve as anticipated.   Total minutes including chart review and phone contact time: 8   Follow up plan: Return in 11 days (on 08/07/2021) for Headache.  [name], MD [place]

## 2021-07-29 ENCOUNTER — Other Ambulatory Visit: Payer: Self-pay | Admitting: Family Medicine

## 2021-08-01 NOTE — Telephone Encounter (Signed)
Last office visit 07/27/21 Last refill 02/01/21, #90, 1 refill

## 2021-08-07 ENCOUNTER — Encounter: Payer: Self-pay | Admitting: Family Medicine

## 2021-08-07 ENCOUNTER — Other Ambulatory Visit: Payer: Self-pay

## 2021-08-07 ENCOUNTER — Ambulatory Visit (INDEPENDENT_AMBULATORY_CARE_PROVIDER_SITE_OTHER): Payer: Medicare HMO | Admitting: Family Medicine

## 2021-08-07 ENCOUNTER — Telehealth: Payer: Medicare HMO

## 2021-08-07 ENCOUNTER — Other Ambulatory Visit: Payer: Self-pay | Admitting: Family Medicine

## 2021-08-07 VITALS — BP 149/80 | HR 77 | Temp 97.6°F | Ht 62.0 in | Wt 177.2 lb

## 2021-08-07 DIAGNOSIS — E1122 Type 2 diabetes mellitus with diabetic chronic kidney disease: Secondary | ICD-10-CM

## 2021-08-07 DIAGNOSIS — F1320 Sedative, hypnotic or anxiolytic dependence, uncomplicated: Secondary | ICD-10-CM | POA: Diagnosis not present

## 2021-08-07 DIAGNOSIS — F411 Generalized anxiety disorder: Secondary | ICD-10-CM

## 2021-08-07 DIAGNOSIS — G44319 Acute post-traumatic headache, not intractable: Secondary | ICD-10-CM

## 2021-08-07 DIAGNOSIS — E785 Hyperlipidemia, unspecified: Secondary | ICD-10-CM

## 2021-08-07 DIAGNOSIS — N1831 Chronic kidney disease, stage 3a: Secondary | ICD-10-CM

## 2021-08-07 DIAGNOSIS — R8281 Pyuria: Secondary | ICD-10-CM

## 2021-08-07 DIAGNOSIS — I10 Essential (primary) hypertension: Secondary | ICD-10-CM

## 2021-08-07 LAB — URINALYSIS
Bilirubin, UA: NEGATIVE
Ketones, UA: NEGATIVE
Nitrite, UA: POSITIVE — AB
Protein,UA: NEGATIVE
RBC, UA: NEGATIVE
Specific Gravity, UA: 1.02 (ref 1.005–1.030)
Urobilinogen, Ur: 0.2 mg/dL (ref 0.2–1.0)
pH, UA: 5.5 (ref 5.0–7.5)

## 2021-08-07 LAB — BAYER DCA HB A1C WAIVED: HB A1C (BAYER DCA - WAIVED): 8.3 % — ABNORMAL HIGH (ref 4.8–5.6)

## 2021-08-07 MED ORDER — ALPRAZOLAM 1 MG PO TABS: ORAL_TABLET | ORAL | 2 refills | Status: DC

## 2021-08-07 MED ORDER — AMLODIPINE BESYLATE 10 MG PO TABS: ORAL_TABLET | ORAL | 1 refills | Status: DC

## 2021-08-07 MED ORDER — OZEMPIC (1 MG/DOSE) 4 MG/3ML ~~LOC~~ SOPN: 1.0000 mg | PEN_INJECTOR | SUBCUTANEOUS | 5 refills | Status: DC

## 2021-08-07 NOTE — Progress Notes (Signed)
Subjective:  Patient ID: Yvonne [name], female    DOB: 1951/07/18  Age: 70 y.o. MRN: [id number]  CC: Medical Management of Chronic Issues   HPI Yvonne [name] presents forFollow-up of diabetes. Patient checks blood sugar at home.   130-150 fasting and 260 postprandial Patient denies symptoms such as polyuria, polydipsia, excessive hunger, nausea No significant hypoglycemic spells noted. Medications reviewed. Pt reports taking them regularly without complication/adverse reaction being reported today.  Checking feet daily.  History Yvonne [name] has a past medical history of Anxiety, Arthritis, Asthma, Chronic cough, Chronic otitis media (12/2017), Full dentures, GERD (gastroesophageal reflux disease), History of stroke (09/2017), Hyperlipidemia, Hypertension, Non-insulin dependent type 2 diabetes mellitus ([place]), Overactive bladder, Recurrent acute suppurative otitis media without spontaneous rupture of left tympanic membrane (02/19/2018), and Transient cerebral ischemia.   She has a past surgical history that includes Cholecystectomy; Colonoscopy (N/A, 08/17/2015); Abdominal hysterectomy; Lacrimal duct exploration (Bilateral); Cataract extraction w/ intraocular lens implant (Right); Vitrectomy (Left, 09/14/2015); Gas insertion (Right); and Myringotomy with tube placement (Bilateral, 01/28/2018).   Her family history includes Arthritis in her brother and mother; Arthritis-Osteo in her son; Asthma in her father; CVA in her mother; Cancer in her brother; Diabetes in her father and mother; Heart disease in her father; Hepatitis C in her sister; Peripheral Artery Disease in her daughter; Stroke in her mother.She reports that she has never smoked. She has never used smokeless tobacco. She reports that she does not drink alcohol and does not use drugs.  Current Outpatient Medications on File Prior to Visit  Medication Sig Dispense Refill   Accu-Chek Softclix Lancets lancets Test BS up to 4 times daily Dx E11.69  400 each 3   albuterol (VENTOLIN HFA) 108 (90 Base) MCG/ACT inhaler TAKE 2 PUFFS BY MOUTH EVERY 6 HOURS AS NEEDED FOR WHEEZE OR SHORTNESS OF BREATH 54 each 0   alendronate (FOSAMAX) 70 MG tablet Take 1 tablet (70 mg total) by mouth every 7 (seven) days. Take with a full glass of water on an empty stomach. 13 tablet 3   aspirin 325 MG tablet Take 325 mg by mouth daily.     atorvastatin (LIPITOR) 80 MG tablet Take 1 tablet (80 mg total) by mouth daily. 90 tablet 2   Blood Glucose Monitoring Suppl (ACCU-CHEK GUIDE) w/Device KIT Test Bs QID Dx E11.69 1 kit 0   butalbital-acetaminophen-caffeine (FIORICET) 50-325-40 MG tablet Take 1 tablet by mouth every 6 (six) hours as needed for headache. 30 tablet 0   clopidogrel (PLAVIX) 75 MG tablet TAKE 1 TABLET BY MOUTH DAILY WITH BREAKFAST. 90 tablet 0   dapagliflozin propanediol (FARXIGA) 10 MG TABS tablet Take 10 mg by mouth daily.     ezetimibe (ZETIA) 10 MG tablet Take 1 tablet (10 mg total) by mouth daily. For cholesterol 90 tablet 1   famotidine (PEPCID) 20 MG tablet famotidine 20 mg tablet  TAKE 1 TABLET BY MOUTH AT BEDTIME     fluticasone furoate-vilanterol (BREO ELLIPTA) 100-25 MCG/INH AEPB Inhale 1 puff into the lungs daily. 180 each 1   furosemide (LASIX) 20 MG tablet Take 1 tablet (20 mg total) by mouth daily. 90 tablet 1   glipiZIDE (GLUCOTROL) 5 MG tablet Take 1 tablet (5 mg total) by mouth daily before breakfast. 90 tablet 1   glucose blood (ACCU-CHEK GUIDE) test strip TEST BLOOD SUGAR 4 TIMES DAILY Dx E11.69 400 strip 3   loratadine (CLARITIN) 10 MG tablet Take 1 tablet (10 mg total) by mouth daily. 90 tablet 3  nitroGLYCERIN (NITROSTAT) 0.4 MG SL tablet Place 1 tablet (0.4 mg total) under the tongue every 5 (five) minutes as needed for chest pain. 25 tablet 10   ofloxacin (FLOXIN) 0.3 % OTIC solution ofloxacin 0.3 % ear drops  INSTILL 5 DROPS IN EACH EAR TWICE A DAY AS NEEDED FOR EAR PAIN     QUEtiapine (SEROQUEL) 25 MG tablet TAKE 1 TABLET  BY MOUTH EVERYDAY AT BEDTIME 90 tablet 1   No current facility-administered medications on file prior to visit.    ROS Review of Systems  Constitutional: Negative.   HENT: Negative.    Eyes:  Negative for visual disturbance.  Respiratory:  Negative for shortness of breath.   Cardiovascular:  Negative for chest pain.  Gastrointestinal:  Negative for abdominal pain.  Musculoskeletal:  Negative for arthralgias.   Objective:  BP (!) 149/80   Pulse 77   Temp 97.6 F (36.4 C)   Ht $R'5\' 2"'qt$  (1.575 m)   Wt 177 lb 3.2 oz (80.4 kg)   SpO2 95%   BMI 32.41 kg/m   BP Readings from Last 3 Encounters:  08/07/21 (!) 149/80  07/12/21 125/69  06/29/21 134/73    Wt Readings from Last 3 Encounters:  08/07/21 177 lb 3.2 oz (80.4 kg)  07/12/21 176 lb 9.6 oz (80.1 kg)  06/29/21 176 lb (79.8 kg)     Physical Exam Constitutional:      General: She is not in acute distress.    Appearance: She is well-developed.  Cardiovascular:     Rate and Rhythm: Normal rate and regular rhythm.  Pulmonary:     Breath sounds: Normal breath sounds.  Musculoskeletal:        General: Normal range of motion.  Skin:    General: Skin is warm and dry.  Neurological:     Mental Status: She is alert and oriented to person, place, and time.      Assessment & Plan:   Yvonne [name] was seen today for medical management of chronic issues.  Diagnoses and all orders for this visit:  Type 2 diabetes mellitus with stage 3a chronic kidney disease, without long-term current use of insulin (HCC) -     Bayer DCA Hb A1c Waived -     Microalbumin / creatinine urine ratio  Essential hypertension -     CBC with Differential/Platelet -     CMP14+EGFR -     amLODipine (NORVASC) 10 MG tablet; TAKE 1 TABLET BY MOUTH EVERY DAY  Hyperlipidemia with target LDL less than 70 -     Lipid panel  GAD (generalized anxiety disorder) -     ALPRAZolam (XANAX) 1 MG tablet; ! Each morning, one each evening, and 1/2 each  afternoon  Benzodiazepine dependence, continuous (HCC) -     ALPRAZolam (XANAX) 1 MG tablet; ! Each morning, one each evening, and 1/2 each afternoon  Acute post-traumatic headache, not intractable -     MR Brain Wo Contrast; Future  Pyuria -     Urinalysis -     Urine Culture  Other orders -     Semaglutide, 1 MG/DOSE, (OZEMPIC, 1 MG/DOSE,) 4 MG/3ML SOPN; Inject 1 mg into the skin once a week.     I have discontinued Yvonne [name]'s Ozempic (0.25 or 0.5 MG/DOSE). I am also having her start on Ozempic (1 MG/DOSE). Additionally, I am having her maintain her loratadine, aspirin, Accu-Chek Softclix Lancets, furosemide, Breo Ellipta, ezetimibe, glipiZIDE, atorvastatin, nitroGLYCERIN, dapagliflozin propanediol, famotidine, ofloxacin, Accu-Chek Guide, alendronate, Accu-Chek  Guide, albuterol, clopidogrel, butalbital-acetaminophen-caffeine, QUEtiapine, amLODipine, and ALPRAZolam.  Meds ordered this encounter  Medications   amLODipine (NORVASC) 10 MG tablet    Sig: TAKE 1 TABLET BY MOUTH EVERY DAY    Dispense:  90 tablet    Refill:  1   ALPRAZolam (XANAX) 1 MG tablet    Sig: ! Each morning, one each evening, and 1/2 each afternoon    Dispense:  75 tablet    Refill:  2   Semaglutide, 1 MG/DOSE, (OZEMPIC, 1 MG/DOSE,) 4 MG/3ML SOPN    Sig: Inject 1 mg into the skin once a week.    Dispense:  3 mL    Refill:  5     Follow-up: Return in about 3 months (around 11/06/2021).  [name], M.D.

## 2021-08-08 LAB — LIPID PANEL
Chol/HDL Ratio: 4.8 ratio — ABNORMAL HIGH (ref 0.0–4.4)
Cholesterol, Total: 254 mg/dL — ABNORMAL HIGH (ref 100–199)
HDL: 53 mg/dL (ref >39)
LDL Chol Calc (NIH): 169 mg/dL — ABNORMAL HIGH (ref 0–99)
Triglycerides: 176 mg/dL — ABNORMAL HIGH (ref 0–149)
VLDL Cholesterol Cal: 32 mg/dL (ref 5–40)

## 2021-08-08 LAB — CBC WITH DIFFERENTIAL/PLATELET
Basophils Absolute: 0 10*3/uL (ref 0.0–0.2)
Basos: 1 %
EOS (ABSOLUTE): 0.1 10*3/uL (ref 0.0–0.4)
Eos: 2 %
Hematocrit: 39 % (ref 34.0–46.6)
Hemoglobin: 13.3 g/dL (ref 11.1–15.9)
Immature Grans (Abs): 0 10*3/uL (ref 0.0–0.1)
Immature Granulocytes: 0 %
Lymphocytes Absolute: 2.1 10*3/uL (ref 0.7–3.1)
Lymphs: 40 %
MCH: 29.3 pg (ref 26.6–33.0)
MCHC: 34.1 g/dL (ref 31.5–35.7)
MCV: 86 fL (ref 79–97)
Monocytes Absolute: 0.4 10*3/uL (ref 0.1–0.9)
Monocytes: 7 %
Neutrophils Absolute: 2.7 10*3/uL (ref 1.4–7.0)
Neutrophils: 50 %
Platelets: 240 10*3/uL (ref 150–450)
RBC: 4.54 x10E6/uL (ref 3.77–5.28)
RDW: 12.8 % (ref 11.7–15.4)
WBC: 5.4 10*3/uL (ref 3.4–10.8)

## 2021-08-08 LAB — CMP14+EGFR
ALT: 31 IU/L (ref 0–32)
AST: 17 IU/L (ref 0–40)
Albumin/Globulin Ratio: 1.8 (ref 1.2–2.2)
Albumin: 4 g/dL (ref 3.7–4.7)
Alkaline Phosphatase: 162 IU/L — ABNORMAL HIGH (ref 44–121)
BUN/Creatinine Ratio: 14 (ref 12–28)
BUN: 15 mg/dL (ref 8–27)
Bilirubin Total: 0.3 mg/dL (ref 0.0–1.2)
CO2: 25 mmol/L (ref 20–29)
Calcium: 9.8 mg/dL (ref 8.7–10.3)
Chloride: 99 mmol/L (ref 96–106)
Creatinine, Ser: 1.05 mg/dL — ABNORMAL HIGH (ref 0.57–1.00)
Globulin, Total: 2.2 g/dL (ref 1.5–4.5)
Glucose: 262 mg/dL — ABNORMAL HIGH (ref 65–99)
Potassium: 4.1 mmol/L (ref 3.5–5.2)
Sodium: 139 mmol/L (ref 134–144)
Total Protein: 6.2 g/dL (ref 6.0–8.5)
eGFR: 57 mL/min/{1.73_m2} — ABNORMAL LOW (ref >59)

## 2021-08-08 LAB — MICROALBUMIN / CREATININE URINE RATIO
Creatinine, Urine: 131.8 mg/dL
Microalb/Creat Ratio: 13 mg/g creat (ref 0–29)
Microalbumin, Urine: 17.2 ug/mL

## 2021-08-10 LAB — URINE CULTURE

## 2021-08-11 ENCOUNTER — Other Ambulatory Visit: Payer: Self-pay | Admitting: Family Medicine

## 2021-08-11 MED ORDER — SULFAMETHOXAZOLE-TRIMETHOPRIM 800-160 MG PO TABS: 1.0000 | ORAL_TABLET | Freq: Two times a day (BID) | ORAL | 0 refills | Status: DC

## 2021-08-16 ENCOUNTER — Telehealth: Payer: Self-pay | Admitting: Family Medicine

## 2021-08-16 ENCOUNTER — Other Ambulatory Visit: Payer: Self-pay | Admitting: Family Medicine

## 2021-08-16 DIAGNOSIS — E785 Hyperlipidemia, unspecified: Secondary | ICD-10-CM

## 2021-08-16 DIAGNOSIS — N1831 Chronic kidney disease, stage 3a: Secondary | ICD-10-CM

## 2021-08-16 NOTE — Telephone Encounter (Signed)
Spoke with patient.

## 2021-08-17 ENCOUNTER — Other Ambulatory Visit: Payer: Self-pay | Admitting: Family Medicine

## 2021-08-17 DIAGNOSIS — J439 Emphysema, unspecified: Secondary | ICD-10-CM

## 2021-08-22 ENCOUNTER — Other Ambulatory Visit: Payer: Self-pay

## 2021-08-22 ENCOUNTER — Ambulatory Visit (HOSPITAL_COMMUNITY)
Admission: RE | Admit: 2021-08-22 | Discharge: 2021-08-22 | Disposition: A | Discharge status: Home / Self Care | Payer: Medicare HMO | Source: Ambulatory Visit | Attending: Family Medicine | Admitting: Family Medicine

## 2021-08-22 DIAGNOSIS — G44319 Acute post-traumatic headache, not intractable: Secondary | ICD-10-CM | POA: Insufficient documentation

## 2021-08-22 DIAGNOSIS — R519 Headache, unspecified: Secondary | ICD-10-CM | POA: Diagnosis not present

## 2021-08-23 ENCOUNTER — Other Ambulatory Visit: Payer: Self-pay | Admitting: Family Medicine

## 2021-08-23 MED ORDER — PREDNISONE 10 MG PO TABS: ORAL_TABLET | ORAL | 0 refills | Status: DC

## 2021-08-23 MED ORDER — LEVOFLOXACIN 500 MG PO TABS: 500.0000 mg | ORAL_TABLET | Freq: Every day | ORAL | 0 refills | Status: DC

## 2021-08-24 NOTE — Progress Notes (Signed)
Returning call.

## 2021-08-29 DIAGNOSIS — R079 Chest pain, unspecified: Secondary | ICD-10-CM | POA: Diagnosis not present

## 2021-08-29 DIAGNOSIS — G44309 Post-traumatic headache, unspecified, not intractable: Secondary | ICD-10-CM | POA: Diagnosis not present

## 2021-08-29 DIAGNOSIS — E1142 Type 2 diabetes mellitus with diabetic polyneuropathy: Secondary | ICD-10-CM | POA: Diagnosis not present

## 2021-08-29 DIAGNOSIS — I6381 Other cerebral infarction due to occlusion or stenosis of small artery: Secondary | ICD-10-CM | POA: Diagnosis not present

## 2021-08-29 DIAGNOSIS — I951 Orthostatic hypotension: Secondary | ICD-10-CM | POA: Diagnosis not present

## 2021-08-30 ENCOUNTER — Other Ambulatory Visit: Payer: Self-pay

## 2021-08-30 ENCOUNTER — Encounter: Payer: Self-pay | Admitting: Family Medicine

## 2021-08-30 ENCOUNTER — Ambulatory Visit (INDEPENDENT_AMBULATORY_CARE_PROVIDER_SITE_OTHER): Payer: Medicare HMO | Admitting: Family Medicine

## 2021-08-30 VITALS — BP 136/77 | HR 85 | Temp 98.6°F | Ht 62.0 in | Wt 175.4 lb

## 2021-08-30 DIAGNOSIS — G44311 Acute post-traumatic headache, intractable: Secondary | ICD-10-CM

## 2021-08-30 DIAGNOSIS — Z23 Encounter for immunization: Secondary | ICD-10-CM | POA: Diagnosis not present

## 2021-08-30 DIAGNOSIS — I619 Nontraumatic intracerebral hemorrhage, unspecified: Secondary | ICD-10-CM

## 2021-08-30 NOTE — Progress Notes (Signed)
Subjective:  Patient ID: Yvonne [name], female    DOB: [birth year], 70 y.o.   MRN: [id number]  Patient Care Team: Yvonne Fraise, MD as PCP - General (Family Medicine) Yvonne [name], Yvonne Ravens, NP (Inactive) as Nurse Practitioner (Internal Medicine) Yvonne Partridge, DO as Consulting Physician (Neurology) Yvonne Gustin, MD (Anesthesiology) Yvonne Evans [name], [name] as Social Worker (Licensed Clinical Social Worker) Yvonne China, RN as Case Manager Yvonne [name], Yvonne [name], [facility] (Pharmacist) Yvonne Lean, MD as Consulting Physician (Cardiology)   Chief Complaint:  referral neurology   HPI: Yvonne [name] is a 70 y.o. female presenting on 08/30/2021 for referral neurology   Pt presents today for a referral to new neurologist. 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 She has had continued headaches and dizziness at times. She is very concerned with diagnosis. She was seen by Dr. Merlene [name] and was told everything looked good. She was not happy with the answer and would like to see [facility] as she has been to them in the past. No new focal neurological deficits.    Relevant past medical, surgical, family, and social history reviewed and updated as indicated.  Allergies and medications reviewed and updated. Data reviewed: Chart in Epic.   Past Medical History:  Diagnosis Date   Anxiety    Arthritis    left hand   Asthma    daily and prn inhalers   Chronic cough    Chronic otitis media 12/2017   Full dentures    GERD (gastroesophageal reflux disease)    History of stroke 09/2017   weakness right hand, numbness right side face   Hyperlipidemia    Hypertension    states under control with meds., has been on med. x a long time, per pt.   Non-insulin dependent type 2 diabetes mellitus (HCC)    Overactive bladder    Recurrent acute suppurative otitis media without spontaneous rupture of left tympanic membrane 02/19/2018    Transient cerebral ischemia     Past Surgical History:  Procedure Laterality Date   ABDOMINAL HYSTERECTOMY     complete   CATARACT EXTRACTION W/ INTRAOCULAR LENS IMPLANT Right    CHOLECYSTECTOMY     COLONOSCOPY N/A 08/17/2015   Procedure: COLONOSCOPY;  Surgeon: Yvonne Houston, MD;  [facility];  Service: Endoscopy;  Laterality: N/A;  200 - moved to 7:30 - Ann notified pt   GAS INSERTION Right    x 2 - eye   LACRIMAL DUCT EXPLORATION Bilateral    removal of tear ducts   MYRINGOTOMY WITH TUBE PLACEMENT Bilateral 01/28/2018   Procedure: BILATERAL MYRINGOTOMY WITH TUBE PLACEMENT;  Surgeon: Yvonne Baptist, MD;  Location: [place];  Service: ENT;  Laterality: Bilateral;   VITRECTOMY Left 09/14/2015    Social History   Socioeconomic History   Marital status: Married    Spouse name: Not on file   Number of children: 3   Years of education: Not on file   Highest education level: GED or equivalent  Occupational History   Occupation: retired    Comment: Customer service manager and restaurants  Tobacco Use   Smoking status: Never   Smokeless tobacco: Never  Vaping Use   Vaping Use: Never used  Substance and Sexual Activity   Alcohol use: No   Drug use: No   Sexual activity: Yes  Other Topics Concern   Not on file  Social History Narrative  Lives at home home with husband with son, daughter in law and 3 children.  Yvonne [name], his girlfriend and her child also moved in 10/2018      Disabled.   Social Determinants of Health   Financial Resource Strain: Low Risk    Difficulty of Paying Living Expenses: Not hard at all  Food Insecurity: No Food Insecurity   Worried About Charity fundraiser in the Last Year: Never true   Yvonne [name] in the Last Year: Never true  Transportation Needs: No Transportation Needs   Lack of Transportation (Medical): No   Lack of Transportation (Non-Medical): No  Physical Activity: Sufficiently Active   Days of Exercise per Week: 7  days   Minutes of Exercise per Session: 50 min  Stress: Stress Concern Present   Feeling of Stress : Very much  Social Connections: Moderately Isolated   Frequency of Communication with Friends and Family: More than three times a week   Frequency of Social Gatherings with Friends and Family: More than three times a week   Attends Religious Services: Never   Marine scientist or Organizations: No   Attends Archivist Meetings: Never   Marital Status: Married  Human resources officer Violence: Not At Risk   Fear of Current or Ex-Partner: No   Emotionally Abused: No   Physically Abused: No   Sexually Abused: No    Outpatient Encounter Medications as of 08/30/2021  Medication Sig   Accu-Chek Softclix Lancets lancets Test BS up to 4 times daily Dx E11.69   albuterol (VENTOLIN HFA) 108 (90 Base) MCG/ACT inhaler TAKE 2 PUFFS BY MOUTH EVERY 6 HOURS AS NEEDED FOR WHEEZE OR SHORTNESS OF BREATH   alendronate (FOSAMAX) 70 MG tablet Take 1 tablet (70 mg total) by mouth every 7 (seven) days. Take with a full glass of water on an empty stomach.   ALPRAZolam (XANAX) 1 MG tablet ! Each morning, one each evening, and 1/2 each afternoon   amLODipine (NORVASC) 10 MG tablet TAKE 1 TABLET BY MOUTH EVERY DAY   aspirin 325 MG tablet Take 325 mg by mouth daily.   atorvastatin (LIPITOR) 80 MG tablet Take 1 tablet (80 mg total) by mouth daily.   Blood Glucose Monitoring Suppl (ACCU-CHEK GUIDE) w/Device KIT Test Bs QID Dx E11.69   butalbital-acetaminophen-caffeine (FIORICET) 50-325-40 MG tablet Take 1 tablet by mouth every 6 (six) hours as needed for headache.   clopidogrel (PLAVIX) 75 MG tablet TAKE 1 TABLET BY MOUTH EVERY DAY WITH BREAKFAST   dapagliflozin propanediol (FARXIGA) 10 MG TABS tablet Take 10 mg by mouth daily.   ezetimibe (ZETIA) 10 MG tablet Take 1 tablet (10 mg total) by mouth daily. For cholesterol   famotidine (PEPCID) 20 MG tablet famotidine 20 mg tablet  TAKE 1 TABLET BY MOUTH AT  BEDTIME   fluticasone furoate-vilanterol (BREO ELLIPTA) 100-25 MCG/INH AEPB Inhale 1 puff into the lungs daily.   furosemide (LASIX) 20 MG tablet Take 1 tablet (20 mg total) by mouth daily.   glipiZIDE (GLUCOTROL) 5 MG tablet Take 1 tablet (5 mg total) by mouth daily before breakfast.   glucose blood (ACCU-CHEK GUIDE) test strip TEST BLOOD SUGAR 4 TIMES DAILY Dx E11.69   levofloxacin (LEVAQUIN) 500 MG tablet Take 1 tablet (500 mg total) by mouth daily. For 10 days   loratadine (CLARITIN) 10 MG tablet Take 1 tablet (10 mg total) by mouth daily.   nitroGLYCERIN (NITROSTAT) 0.4 MG SL tablet Place 1 tablet (0.4 mg total)  under the tongue every 5 (five) minutes as needed for chest pain.   ofloxacin (FLOXIN) 0.3 % OTIC solution ofloxacin 0.3 % ear drops  INSTILL 5 DROPS IN EACH EAR TWICE A DAY AS NEEDED FOR EAR PAIN   predniSONE (DELTASONE) 10 MG tablet Take 5 daily for 3 days followed by 4,3,2 and 1 for 3 days each.   QUEtiapine (SEROQUEL) 25 MG tablet TAKE 1 TABLET BY MOUTH EVERYDAY AT BEDTIME   Semaglutide, 1 MG/DOSE, (OZEMPIC, 1 MG/DOSE,) 4 MG/3ML SOPN Inject 1 mg into the skin once a week.   sulfamethoxazole-trimethoprim (BACTRIM DS) 800-160 MG tablet Take 1 tablet by mouth 2 (two) times daily.   No facility-administered encounter medications on file as of 08/30/2021.    Allergies  Allergen Reactions   Penicillins Hives and Itching    Has patient had a PCN reaction causing immediate rash, facial/tongue/throat swelling, SOB or lightheadedness with hypotension: Yes Has patient had a PCN reaction causing severe rash involving mucus membranes or skin necrosis: Unk Has patient had a PCN reaction that required hospitalization: No Has patient had a PCN reaction occurring within the last 10 years: No If all of the above answers are "NO", then may proceed with Cephalosporin use.  Has patient had a PCN reaction causing immediate rash, facial/tongue/throat swelling, SOB or lightheadedness with  hypotension: Yes Has patient had a PCN reaction causing severe rash involving mucus membranes or skin necrosis: No Has patient had a PCN reaction that required hospitalization No Has patient had a PCN reaction occurring within the last 10 years: No If all of the above answers are "NO", then may proceed with Cephalosporin use. Has patient had a PCN reaction causing immediate rash, facial/tongue/throat swelling, SOB or lightheadedness with hypotension: Yes Has patient had a PCN reaction causing severe rash involving mucus membranes or skin necrosis: Unk Has patient had a PCN reaction that required hospitalization: No Has patient had a PCN reaction occurring within the last 10 years: No If all of the above answers are "NO", then may proceed with Cephalosporin use. Has patient had a PCN reaction causing immediate rash, facial/tongue/throat swelling, SOB or lightheadedness with hypotension: Yes Has patient had a PCN reaction causing severe rash involving mucus membranes or skin necrosis: No Has patient had a PCN reaction that required hospitalization No Has patient had a PCN reaction occurring within the last 10 years: No If all of the above answers are "NO", then may proceed with Cephalosporin use. Has patient had a PCN reaction causing immediate rash, facial/tongue/throat swelling, SOB or lightheadedness with hypotension: Yes Has patient had a PCN reaction causing sev... (TRUNCATED)   Gabapentin Other (See Comments)    Causes a lot of lethargy at a higher dose Causes a lot of lethargy at a higher dose Causes a lot of lethargy at a higher dose   Lisinopril Cough    HAS A CHRONIC COUGH AND DID NOT NOTICE IMPROVEMENT OF COUGH WHEN LISINOPRIL WAS STOPPED HAS A CHRONIC COUGH AND DID NOT NOTICE IMPROVEMENT OF COUGH WHEN LISINOPRIL WAS STOPPED HAS A CHRONIC COUGH AND DID NOT NOTICE IMPROVEMENT OF COUGH WHEN LISINOPRIL WAS STOPPED   Soap Rash    DIAL soap    Review of Systems  Constitutional:   Negative for activity change, appetite change, chills, fatigue and fever.  HENT: Negative.    Eyes: Negative.   Respiratory:  Negative for cough, chest tightness and shortness of breath.   Cardiovascular:  Negative for chest pain, palpitations and leg swelling.  Gastrointestinal:  Negative for blood in stool, constipation, diarrhea, nausea and vomiting.  Endocrine: Negative.   Genitourinary:  Negative for decreased urine volume, difficulty urinating, dysuria, frequency and urgency.  Musculoskeletal:  Positive for gait problem (unsteady). Negative for arthralgias and myalgias.  Skin: Negative.   Allergic/Immunologic: Negative.   Neurological:  Positive for light-headedness and headaches. Negative for dizziness, tremors, seizures, syncope, facial asymmetry, speech difficulty, weakness and numbness.  Hematological: Negative.   Psychiatric/Behavioral:  Negative for confusion, hallucinations, sleep disturbance and suicidal ideas.   All other systems reviewed and are negative.      Objective:  BP 136/77   Pulse 85   Temp 98.6 F (37 C)   Ht $R'5\' 2"'WB$  (1.575 m)   Wt 175 lb 6.4 oz (79.6 kg)   SpO2 97%   BMI 32.08 kg/m    Wt Readings from Last 3 Encounters:  08/30/21 175 lb 6.4 oz (79.6 kg)  08/07/21 177 lb 3.2 oz (80.4 kg)  07/12/21 176 lb 9.6 oz (80.1 kg)    Physical Exam Vitals and nursing note reviewed.  Constitutional:      General: She is not in acute distress.    Appearance: Normal appearance. She is well-developed and well-groomed. She is obese. She is not ill-appearing, toxic-appearing or diaphoretic.  HENT:     Head: Normocephalic and atraumatic.     Jaw: There is normal jaw occlusion.     Right Ear: Hearing normal.     Left Ear: Hearing normal.     Nose: Nose normal.     Mouth/Throat:     Lips: Pink.     Mouth: Mucous membranes are moist.     Pharynx: Oropharynx is clear. Uvula midline.  Eyes:     General: Lids are normal.     Extraocular Movements: Extraocular  movements intact.     Conjunctiva/sclera: Conjunctivae normal.     Pupils: Pupils are equal, round, and reactive to light.  Neck:     Thyroid: No thyroid mass, thyromegaly or thyroid tenderness.     Vascular: No carotid bruit or JVD.     Trachea: Trachea and phonation normal.  Cardiovascular:     Rate and Rhythm: Normal rate and regular rhythm.     Chest Wall: PMI is not displaced.     Pulses: Normal pulses.     Heart sounds: Normal heart sounds. No murmur heard.   No friction rub. No gallop.  Pulmonary:     Effort: Pulmonary effort is normal. No respiratory distress.     Breath sounds: Normal breath sounds. No wheezing.  Abdominal:     General: There is no abdominal bruit.     Palpations: Abdomen is soft. There is no hepatomegaly or splenomegaly.  Musculoskeletal:        General: Normal range of motion.     Cervical back: Normal range of motion and neck supple.     Right lower leg: No edema.     Left lower leg: No edema.  Lymphadenopathy:     Cervical: No cervical adenopathy.  Skin:    General: Skin is warm and dry.     Capillary Refill: Capillary refill takes less than 2 seconds.     Coloration: Skin is not cyanotic, jaundiced or pale.     Findings: No rash.  Neurological:     General: No focal deficit present.     Mental Status: She is alert and oriented to person, place, and time. Mental status is at baseline.  Cranial Nerves: Cranial nerves are intact.     Sensory: Sensation is intact.     Motor: Motor function is intact.     Coordination: Coordination is intact.     Gait: Gait is intact.     Deep Tendon Reflexes: Reflexes are normal and symmetric.  Psychiatric:        Attention and Perception: Attention and perception normal.        Mood and Affect: Mood and affect normal.        Speech: Speech normal.        Behavior: Behavior normal. Behavior is cooperative.        Thought Content: Thought content normal.        Cognition and Memory: Cognition and memory  normal.        Judgment: Judgment normal.    Results for orders placed or performed in visit on 08/07/21  Urine Culture   Specimen: Urine   UR  Result Value Ref Range   Urine Culture, Routine Final report (A)    Organism ID, Bacteria Escherichia coli (A)    Antimicrobial Susceptibility Comment   Bayer DCA Hb A1c Waived  Result Value Ref Range   HB A1C (BAYER DCA - WAIVED) 8.3 (H) 4.8 - 5.6 %  CBC with Differential/Platelet  Result Value Ref Range   WBC 5.4 3.4 - 10.8 x10E3/uL   RBC 4.54 3.77 - 5.28 x10E6/uL   Hemoglobin 13.3 11.1 - 15.9 g/dL   Hematocrit 11.2 51.0 - 46.6 %   MCV 86 79 - 97 fL   MCH 29.3 26.6 - 33.0 pg   MCHC 34.1 31.5 - 35.7 g/dL   RDW 23.9 55.1 - 86.3 %   Platelets 240 150 - 450 x10E3/uL   Neutrophils 50 Not Estab. %   Lymphs 40 Not Estab. %   Monocytes 7 Not Estab. %   Eos 2 Not Estab. %   Basos 1 Not Estab. %   Neutrophils Absolute 2.7 1.4 - 7.0 x10E3/uL   Lymphocytes Absolute 2.1 0.7 - 3.1 x10E3/uL   Monocytes Absolute 0.4 0.1 - 0.9 x10E3/uL   EOS (ABSOLUTE) 0.1 0.0 - 0.4 x10E3/uL   Basophils Absolute 0.0 0.0 - 0.2 x10E3/uL   Immature Granulocytes 0 Not Estab. %   Immature Grans (Abs) 0.0 0.0 - 0.1 x10E3/uL  CMP14+EGFR  Result Value Ref Range   Glucose 262 (H) 65 - 99 mg/dL   BUN 15 8 - 27 mg/dL   Creatinine, Ser 3.91 (H) 0.57 - 1.00 mg/dL   eGFR 57 (L) >36 GL/QED/1.28   BUN/Creatinine Ratio 14 12 - 28   Sodium 139 134 - 144 mmol/L   Potassium 4.1 3.5 - 5.2 mmol/L   Chloride 99 96 - 106 mmol/L   CO2 25 20 - 29 mmol/L   Calcium 9.8 8.7 - 10.3 mg/dL   Total Protein 6.2 6.0 - 8.5 g/dL   Albumin 4.0 3.7 - 4.7 g/dL   Globulin, Total 2.2 1.5 - 4.5 g/dL   Albumin/Globulin Ratio 1.8 1.2 - 2.2   Bilirubin Total 0.3 0.0 - 1.2 mg/dL   Alkaline Phosphatase 162 (H) 44 - 121 IU/L   AST 17 0 - 40 IU/L   ALT 31 0 - 32 IU/L  Lipid panel  Result Value Ref Range   Cholesterol, Total 254 (H) 100 - 199 mg/dL   Triglycerides 461 (H) 0 - 149 mg/dL   HDL 53  >59 mg/dL   VLDL Cholesterol Cal 32 5 - 40 mg/dL  LDL Chol Calc (NIH) 169 (H) 0 - 99 mg/dL   Chol/HDL Ratio 4.8 (H) 0.0 - 4.4 ratio  Microalbumin / creatinine urine ratio  Result Value Ref Range   Creatinine, Urine 131.8 Not Estab. mg/dL   Microalbumin, Urine 17.2 Not Estab. ug/mL   Microalb/Creat Ratio 13 0 - 29 mg/g creat  Urinalysis  Result Value Ref Range   Specific Gravity, UA 1.020 1.005 - 1.030   pH, UA 5.5 5.0 - 7.5   Color, UA Yellow Yellow   Appearance Ur Cloudy (A) Clear   Leukocytes,UA Trace (A) Negative   Protein,UA Negative Negative/Trace   Glucose, UA 3+ (A) Negative   Ketones, UA Negative Negative   RBC, UA Negative Negative   Bilirubin, UA Negative Negative   Urobilinogen, Ur 0.2 0.2 - 1.0 mg/dL   Nitrite, UA Positive (A) Negative       Pertinent labs & imaging results that were available during my care of the patient were reviewed by me and considered in my medical decision making.  Assessment & Plan:  Yvonne [name] was seen today for referral neurology.  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 Findings of imaging discussed in detail with pt during todays visit. Reassurance provided. She has continued headaches. Has seen Dr. Merlene [name] and would like a second opinion. No acute focal deficits present.  -     Ambulatory referral to Neurology  Influenza vaccine given in clinic today   Continue all other maintenance medications.  Follow up plan: Return if symptoms worsen or fail to improve.   Continue healthy lifestyle choices, including diet (rich in fruits, vegetables, and [name] proteins, and low in salt and simple carbohydrates) and exercise (at least 30 minutes of moderate physical activity daily).   The above assessment and management plan was discussed with the patient. The patient verbalized  understanding of and has agreed to the management plan. Patient is aware to call the clinic if they develop any new symptoms or if symptoms persist or worsen. Patient is aware when to return to the clinic for a follow-up visit. Patient educated on when it is appropriate to go to the emergency department.   Monia Pouch, FNP-C [facility] [phone]

## 2021-08-31 ENCOUNTER — Encounter: Payer: Self-pay | Admitting: Neurology

## 2021-09-02 ENCOUNTER — Other Ambulatory Visit: Payer: Self-pay | Admitting: Family Medicine

## 2021-09-02 DIAGNOSIS — E785 Hyperlipidemia, unspecified: Secondary | ICD-10-CM

## 2021-09-02 DIAGNOSIS — N1831 Chronic kidney disease, stage 3a: Secondary | ICD-10-CM

## 2021-09-02 DIAGNOSIS — E1122 Type 2 diabetes mellitus with diabetic chronic kidney disease: Secondary | ICD-10-CM

## 2021-09-05 ENCOUNTER — Encounter: Payer: Self-pay | Admitting: Family Medicine

## 2021-09-05 ENCOUNTER — Other Ambulatory Visit: Payer: Self-pay

## 2021-09-05 ENCOUNTER — Ambulatory Visit (INDEPENDENT_AMBULATORY_CARE_PROVIDER_SITE_OTHER): Payer: Medicare HMO | Admitting: Family Medicine

## 2021-09-05 VITALS — BP 124/76 | HR 70 | Temp 97.4°F | Ht 62.0 in | Wt 176.2 lb

## 2021-09-05 DIAGNOSIS — B9689 Other specified bacterial agents as the cause of diseases classified elsewhere: Secondary | ICD-10-CM

## 2021-09-05 DIAGNOSIS — B3731 Acute candidiasis of vulva and vagina: Secondary | ICD-10-CM

## 2021-09-05 DIAGNOSIS — N898 Other specified noninflammatory disorders of vagina: Secondary | ICD-10-CM

## 2021-09-05 DIAGNOSIS — N760 Acute vaginitis: Secondary | ICD-10-CM

## 2021-09-05 LAB — WET PREP FOR TRICH, YEAST, CLUE
Clue Cell Exam: POSITIVE — AB
Trichomonas Exam: NEGATIVE

## 2021-09-05 MED ORDER — METRONIDAZOLE 500 MG PO TABS: 500.0000 mg | ORAL_TABLET | Freq: Two times a day (BID) | ORAL | 0 refills | Status: DC

## 2021-09-05 MED ORDER — FLUCONAZOLE 150 MG PO TABS: 150.0000 mg | ORAL_TABLET | Freq: Once | ORAL | 0 refills | Status: AC

## 2021-09-05 NOTE — Progress Notes (Signed)
Assessment & Plan:  1. Bacterial vaginosis Education provided on BV. - metroNIDAZOLE (FLAGYL) 500 MG tablet; Take 1 tablet (500 mg total) by mouth 2 (two) times daily.  Dispense: 14 tablet; Refill: 0  2. Vaginal yeast infection - fluconazole (DIFLUCAN) 150 MG tablet; Take 1 tablet (150 mg total) by mouth once for 1 dose. May repeat after 3 days if needed.  Dispense: 2 tablet; Refill: 0  3. Vagina itching - WET PREP FOR TRICH, YEAST, CLUE: positive for clue cells and yeast   Follow up plan: Return if symptoms worsen or fail to improve.  Hendricks Limes, MSN, APRN, FNP-C [facility]  Subjective:   Patient ID: Yvonne [name], female    DOB: February 26, 1951, 70 y.o.   MRN: [id number]  HPI: Yvonne [name] is a 70 y.o. female presenting on 09/05/2021 for Vaginal Itching (X 4-5 days) and Vaginal Discharge  Patient reports vaginal itching, swelling, and discharge that started 4-5 days ago. She describes discharge as thick and yellow. She has tried OTC Monistat cream and cranberry pills which she does feel has decreased her swelling some. She has been on antibiotics recently.    ROS: Negative unless specifically indicated above in HPI.   Relevant past medical history reviewed and updated as indicated.   Allergies and medications reviewed and updated.   Current Outpatient Medications:    Accu-Chek Softclix Lancets lancets, Test BS up to 4 times daily Dx E11.69, Disp: 400 each, Rfl: 3   albuterol (VENTOLIN HFA) 108 (90 Base) MCG/ACT inhaler, TAKE 2 PUFFS BY MOUTH EVERY 6 HOURS AS NEEDED FOR WHEEZE OR SHORTNESS OF BREATH, Disp: 54 each, Rfl: 0   alendronate (FOSAMAX) 70 MG tablet, Take 1 tablet (70 mg total) by mouth every 7 (seven) days. Take with a full glass of water on an empty stomach., Disp: 13 tablet, Rfl: 3   ALPRAZolam (XANAX) 1 MG tablet, ! Each morning, one each evening, and 1/2 each afternoon, Disp: 75 tablet, Rfl: 2   amLODipine (NORVASC) 10 MG tablet, TAKE 1  TABLET BY MOUTH EVERY DAY, Disp: 90 tablet, Rfl: 1   atorvastatin (LIPITOR) 80 MG tablet, Take 1 tablet (80 mg total) by mouth daily., Disp: 90 tablet, Rfl: 2   Blood Glucose Monitoring Suppl (ACCU-CHEK GUIDE) w/Device KIT, Test Bs QID Dx E11.69, Disp: 1 kit, Rfl: 0   clopidogrel (PLAVIX) 75 MG tablet, TAKE 1 TABLET BY MOUTH EVERY DAY WITH BREAKFAST, Disp: 90 tablet, Rfl: 0   dapagliflozin propanediol (FARXIGA) 10 MG TABS tablet, Take 10 mg by mouth daily., Disp: , Rfl:    ezetimibe (ZETIA) 10 MG tablet, Take 1 tablet (10 mg total) by mouth daily. For cholesterol, Disp: 90 tablet, Rfl: 1   famotidine (PEPCID) 20 MG tablet, famotidine 20 mg tablet  TAKE 1 TABLET BY MOUTH AT BEDTIME, Disp: , Rfl:    fluticasone furoate-vilanterol (BREO ELLIPTA) 100-25 MCG/INH AEPB, Inhale 1 puff into the lungs daily., Disp: 180 each, Rfl: 1   furosemide (LASIX) 20 MG tablet, Take 1 tablet (20 mg total) by mouth daily., Disp: 90 tablet, Rfl: 1   glipiZIDE (GLUCOTROL) 5 MG tablet, Take 1 tablet (5 mg total) by mouth daily before breakfast., Disp: 90 tablet, Rfl: 1   glucose blood (ACCU-CHEK GUIDE) test strip, TEST BLOOD SUGAR 4 TIMES DAILY Dx E11.69, Disp: 400 strip, Rfl: 3   loratadine (CLARITIN) 10 MG tablet, Take 1 tablet (10 mg total) by mouth daily., Disp: 90 tablet, Rfl: 3   nitroGLYCERIN (  NITROSTAT) 0.4 MG SL tablet, Place 1 tablet (0.4 mg total) under the tongue every 5 (five) minutes as needed for chest pain., Disp: 25 tablet, Rfl: 10   ofloxacin (FLOXIN) 0.3 % OTIC solution, ofloxacin 0.3 % ear drops  INSTILL 5 DROPS IN EACH EAR TWICE A DAY AS NEEDED FOR EAR PAIN, Disp: , Rfl:    Semaglutide, 1 MG/DOSE, (OZEMPIC, 1 MG/DOSE,) 4 MG/3ML SOPN, Inject 1 mg into the skin once a week., Disp: 3 mL, Rfl: 5  Allergies  Allergen Reactions   Penicillins Hives and Itching    Has patient had a PCN reaction causing immediate rash, facial/tongue/throat swelling, SOB or lightheadedness with hypotension: Yes Has patient had  a PCN reaction causing severe rash involving mucus membranes or skin necrosis: Unk Has patient had a PCN reaction that required hospitalization: No Has patient had a PCN reaction occurring within the last 10 years: No If all of the above answers are "NO", then may proceed with Cephalosporin use.  Has patient had a PCN reaction causing immediate rash, facial/tongue/throat swelling, SOB or lightheadedness with hypotension: Yes Has patient had a PCN reaction causing severe rash involving mucus membranes or skin necrosis: No Has patient had a PCN reaction that required hospitalization No Has patient had a PCN reaction occurring within the last 10 years: No If all of the above answers are "NO", then may proceed with Cephalosporin use. Has patient had a PCN reaction causing immediate rash, facial/tongue/throat swelling, SOB or lightheadedness with hypotension: Yes Has patient had a PCN reaction causing severe rash involving mucus membranes or skin necrosis: Unk Has patient had a PCN reaction that required hospitalization: No Has patient had a PCN reaction occurring within the last 10 years: No If all of the above answers are "NO", then may proceed with Cephalosporin use. Has patient had a PCN reaction causing immediate rash, facial/tongue/throat swelling, SOB or lightheadedness with hypotension: Yes Has patient had a PCN reaction causing severe rash involving mucus membranes or skin necrosis: No Has patient had a PCN reaction that required hospitalization No Has patient had a PCN reaction occurring within the last 10 years: No If all of the above answers are "NO", then may proceed with Cephalosporin use. Has patient had a PCN reaction causing immediate rash, facial/tongue/throat swelling, SOB or lightheadedness with hypotension: Yes Has patient had a PCN reaction causing sev... (TRUNCATED)   Gabapentin Other (See Comments)    Causes a lot of lethargy at a higher dose Causes a lot of lethargy at a  higher dose Causes a lot of lethargy at a higher dose   Lisinopril Cough    HAS A CHRONIC COUGH AND DID NOT NOTICE IMPROVEMENT OF COUGH WHEN LISINOPRIL WAS STOPPED HAS A CHRONIC COUGH AND DID NOT NOTICE IMPROVEMENT OF COUGH WHEN LISINOPRIL WAS STOPPED HAS A CHRONIC COUGH AND DID NOT NOTICE IMPROVEMENT OF COUGH WHEN LISINOPRIL WAS STOPPED   Soap Rash    DIAL soap    Objective:   BP 124/76   Pulse 70   Temp (!) 97.4 F (36.3 C) (Temporal)   Ht $R'5\' 2"'aa$  (1.575 m)   Wt 176 lb 3.2 oz (79.9 kg)   BMI 32.23 kg/m    Physical Exam Vitals reviewed.  Constitutional:      General: She is not in acute distress.    Appearance: Normal appearance. She is not ill-appearing, toxic-appearing or diaphoretic.  HENT:     Head: Normocephalic and atraumatic.  Eyes:     General: No scleral  icterus.       Right eye: No discharge.        Left eye: No discharge.     Conjunctiva/sclera: Conjunctivae normal.  Cardiovascular:     Rate and Rhythm: Normal rate.  Pulmonary:     Effort: Pulmonary effort is normal. No respiratory distress.  Musculoskeletal:        General: Normal range of motion.     Cervical back: Normal range of motion.  Skin:    General: Skin is warm and dry.     Capillary Refill: Capillary refill takes less than 2 seconds.  Neurological:     General: No focal deficit present.     Mental Status: She is alert and oriented to person, place, and time. Mental status is at baseline.  Psychiatric:        Mood and Affect: Mood normal.        Behavior: Behavior normal.        Thought Content: Thought content normal.        Judgment: Judgment normal.

## 2021-09-05 NOTE — Patient Instructions (Signed)
I am treating you for bacterial vaginosis.  This is NOT a sexually transmitted disease.  Bacterial vaginosis is an overgrowth of normal vaginal bacteria.  I have prescribed Metronidazole 500mg  for you to take 2 times a day for the next week.  DO NOT drink alcohol while taking this medication or you will become extremely ill.  Healthy vaginal hygiene practices   -  Avoid sleeper pajamas. Nightgowns allow air to circulate.  Sleep without underpants whenever possible.  -  Wear cotton underpants during the day. Double-rinse underwear after washing to avoid residual irritants. Do not use fabric softeners for underwear and swimsuits.  - Avoid tights, leotards, leggings, "skinny" jeans, and other tight-fitting clothing. Skirts and loose-fitting pants allow air to circulate.  - Eat yogurt or use a probiotic that contains Lactobacillus.  This will help maintain healthy vaginal balance.  - Avoid pantyliners.  Instead use tampons or cotton pads.  - Daily warm bathing is helpful:     - Soak in clean water (no soap) for 10 to 15 minutes. Adding vinegar or baking soda to the water has not been specifically studied and may not be better than clean water alone.      - Use soap to wash regions other than the genital area just before getting out of the tub. Limit use of any soap on genital areas. Use fragance-free soaps.     - Rinse the genital area well and gently pat dry.  Don't rub.  Hair dryer to assist with drying can be used only if on cool setting.     - Do not use bubble baths or perfumed soaps.  - Do not use any feminine sprays, douches or powders.  These contain chemicals that will irritate the skin.  - If the genital area is tender or swollen, cool compresses may relieve the discomfort. Unscented wet wipes can be used instead of toilet paper for wiping.   - Emollients, such as Vaseline, may help protect skin and can be applied to the irritated area.  - Always remember to wipe front-to-back after  bowel movements. Pat dry after urination.  - Do not sit in wet swimsuits for long periods of time after swimming

## 2021-09-11 NOTE — Progress Notes (Deleted)
Cardiology Office Note:    Date:  09/11/2021   ID:  Yvonne [name], DOB 09/02/1951, MRN [id number]  PCP:  Yvonne [name], [place]  Cardiologist:  None  Advanced Practice Provider:  No care team member to display Electrophysiologist:  None      CC: follow up chest pain and palpitations.  History of Present Illness:    Yvonne [name] is a 70 y.o. female with a hx of HTN with DM orthostatic hypotension, HFpEF NOS, Chronic Migraine, GERD, HLD with DM and Aortic atherosclerosis, stoke with prior lacunar infarcts 01/03/21, CKD IIIa who presents for evaluation 01/20/21.  In interim of this visit, patient was found to have moderate CAD but CT with negative CT-FFR, .   Patient notes that she is feeling tired.  Patient has occasional chest pressure associate with nausea and and pain in her left arm.   Discomfort occurs with at no certain times, worsens without activity or exercise, but increases with stress, and improves with time and rest.  Patient exertion notable for running round with her grandkids with and feels no symptoms.  Notes DOE when climb steps.  Notes chronic cough.  Never smoker.  Notes orthopnea; sleeps on three pillows.  Notes dizziness with bending over;  no weight gain, notes leg swelling .  No syncope or near syncope. Notes some funny heart flutters and palpitations.  These occur spontaneously.  Feels them most in her legs and come on up.      Ambulatory BP: is all over the place; it changes with position.  Past Medical History:  Diagnosis Date   Anxiety    Arthritis    left hand   Asthma    daily and prn inhalers   Chronic cough    Chronic otitis media 12/2017   Full dentures    GERD (gastroesophageal reflux disease)    History of stroke 09/2017   weakness right hand, numbness right side face   Hyperlipidemia    Hypertension    states under control with meds., has been on med. x a long time, per pt.   Non-insulin dependent type 2 diabetes  mellitus (HCC)    Overactive bladder    Recurrent acute suppurative otitis media without spontaneous rupture of left tympanic membrane 02/19/2018   Transient cerebral ischemia     Past Surgical History:  Procedure Laterality Date   ABDOMINAL HYSTERECTOMY     complete   CATARACT EXTRACTION W/ INTRAOCULAR LENS IMPLANT Right    CHOLECYSTECTOMY     COLONOSCOPY N/A 08/17/2015   Procedure: COLONOSCOPY;  Surgeon: Yvonne Houston, MD;  [facility];  Service: Endoscopy;  Laterality: N/A;  200 - moved to 7:30 - Ann notified pt   GAS INSERTION Right    x 2 - eye   LACRIMAL DUCT EXPLORATION Bilateral    removal of tear ducts   MYRINGOTOMY WITH TUBE PLACEMENT Bilateral 01/28/2018   Procedure: BILATERAL MYRINGOTOMY WITH TUBE PLACEMENT;  Surgeon: Yvonne Baptist, MD;  Location: [facility];  Service: ENT;  Laterality: Bilateral;   VITRECTOMY Left 09/14/2015    Current Medications: No outpatient medications have been marked as taking for the 09/12/21 encounter (Appointment) with Yvonne Lean, MD.     Allergies:   Penicillins, Gabapentin, Lisinopril, and Soap   Social History   Socioeconomic History   Marital status: Married    Spouse name: Not on file   Number of children: 3   Years  of education: Not on file   Highest education level: GED or equivalent  Occupational History   Occupation: retired    Comment: Customer service manager and restaurants  Tobacco Use   Smoking status: Never   Smokeless tobacco: Never  Vaping Use   Vaping Use: Never used  Substance and Sexual Activity   Alcohol use: No   Drug use: No   Sexual activity: Yes  Other Topics Concern   Not on file  Social History Narrative   Lives at home home with husband with son, daughter in law and 3 children.  Yvonne [name], his girlfriend and her child also moved in 10/2018      Disabled.   Social Determinants of Health   Financial Resource Strain: Low Risk    Difficulty of Paying Living Expenses:  Not hard at all  Food Insecurity: No Food Insecurity   Worried About Charity fundraiser in the Last Year: Never true   [place] in the Last Year: Never true  Transportation Needs: No Transportation Needs   Lack of Transportation (Medical): No   Lack of Transportation (Non-Medical): No  Physical Activity: Sufficiently Active   Days of Exercise per Week: 7 days   Minutes of Exercise per Session: 50 min  Stress: Stress Concern Present   Feeling of Stress : Very much  Social Connections: Moderately Isolated   Frequency of Communication with Friends and Family: More than three times a week   Frequency of Social Gatherings with Friends and Family: More than three times a week   Attends Religious Services: Never   Marine scientist or Organizations: No   Attends Music therapist: Never   Marital Status: Married    Social :  Had three grandkids: 2 boys, one girl  Family History: The patient's family history includes Arthritis in her brother and mother; Arthritis-Osteo in her son; Asthma in her father; CVA in her mother; Cancer in her brother; Diabetes in her father and mother; Heart disease in her father; Hepatitis C in her sister; Peripheral Artery Disease in her daughter; Stroke in her mother.  ROS:   Please see the history of present illness.     All other systems reviewed and are negative.  EKGs/Labs/Other Studies Reviewed:    The following studies were reviewed today:  EKG:   01/20/21: SR 69 anterior infarct, inferior infarct pattern 06/11/2019: SR 69 Nonspecific TWI  Recent Labs: 08/07/2021: ALT 31; BUN 15; Creatinine, Ser 1.05; Hemoglobin 13.3; Platelets 240; Potassium 4.1; Sodium 139  Recent Lipid Panel    Component Value Date/Time   CHOL 254 (H) 08/07/2021 0948   TRIG 176 (H) 08/07/2021 0948   HDL 53 08/07/2021 0948   CHOLHDL 4.8 (H) 08/07/2021 0948   CHOLHDL 5.7 06/11/2019 0622   VLDL 39 06/11/2019 0622   LDLCALC 169 (H) 08/07/2021 0948    Cardiac Event Monitoring: Date: 02/10/21 Results: Patient had a minimum heart rate of 53 bpm, maximum heart rate of 160 bpm, and average heart rate of 73 bpm. Predominant underlying rhythm was sinus rhythm. Six runs of suprventricular tachycardia occurred lasting 7 beats at longest with a max rate of 160 bpm at fastest. Isolated PACs were rare (<1.0%), with rare couplets and triplets present. Isolated PVCs were rare (<1.0%). No evidence of complete heart block. Triggered and diary events associated with sinus rhythm.   No malignant arrhythmias.   Cardiac CT Date: 02/09/21 Results: IMPRESSION: 1. Coronary calcium score of 736. This was 94th percentile  for age, sex, and race matched control.   2. Normal coronary origin.  Right dominance.   3. CAD-RADS 3. Moderate stenosis. Consider symptom-guided anti-ischemic pharmacotherapy as well as risk factor modification per guideline directed care. Additional analysis with CT FFR will be submitted.  4. CT-FFR negative   Transthoracic Echocardiogram: Date: 06/11/2019 Results: 1. The left ventricle has normal systolic function with an ejection  fraction of 60-65%. The cavity size was normal. There is moderately  increased left ventricular wall thickness. Left ventricular diastolic  Doppler parameters are consistent with impaired   relaxation. Elevated mean left atrial pressure.   2. The right ventricle has normal systolic function. The cavity was  normal. There is no increase in right ventricular wall thickness.   3. Left atrial size was mildly dilated.   4. No evidence of mitral valve stenosis.   5. The aortic valve has an indeterminate number of cusps. No stenosis of  the aortic valve.   6. The aortic root is normal in size and structure.   7. Pulmonary hypertension is indeterminate, inadequate TR jet.   NM Stress Testing : Date: 08/13/2016 Results: Report only from [facility]:  Normal function with perfusion defect  Risk  Assessment/Calculations:   N/A   Physical Exam:    VS:  There were no vitals taken for this visit.    Wt Readings from Last 3 Encounters:  09/05/21 176 lb 3.2 oz (79.9 kg)  08/30/21 175 lb 6.4 oz (79.6 kg)  08/07/21 177 lb 3.2 oz (80.4 kg)    GEN:  Well nourished, well developed in no acute distress HEENT: Normal NECK: No JVD; *** LYMPHATICS: No lymphadenopathy CARDIAC: RRR, no murmurs, rubs, gallops RESPIRATORY:  Clear to auscultation without rales, wheezing or rhonchi  ABDOMEN: Soft, non-tender, non-distended MUSCULOSKELETAL:  Non pitting edema; No deformity  SKIN: Warm and dry NEUROLOGIC:  Alert and oriented x 3 PSYCHIATRIC:  Normal affect   ASSESSMENT:    No diagnosis found.  PLAN:    In order of problems listed above:  CAD- moderate non-obstructive by CT 3/22 CKD stage IIIa Prior stroke HLD and Aortic Atheroscleris - ASA 81 mg PO Daily *** - LDL goal < 70   Orthostatic Hypotension - asymptomatic presently  Will plan for *** follow up unless new symptoms or abnormal test results warranting change in plan  Would be reasonable for *** APP Follow up         Medication Adjustments/Labs and Tests Ordered: Current medicines are reviewed at length with the patient today.  Concerns regarding medicines are outlined above.  No orders of the defined types were placed in this encounter.  No orders of the defined types were placed in this encounter.   There are no Patient Instructions on file for this visit.   Signed, Yvonne Lean, MD  09/11/2021 12:02 PM    [place]

## 2021-09-12 ENCOUNTER — Ambulatory Visit: Payer: Medicare HMO | Admitting: Internal Medicine

## 2021-09-21 ENCOUNTER — Telehealth: Payer: Medicare HMO

## 2021-09-26 ENCOUNTER — Telehealth: Payer: Self-pay | Admitting: Family Medicine

## 2021-09-26 NOTE — Telephone Encounter (Signed)
Pt calling to check on dosage of Ozempic. She stated that she had enough for this year but wanted to be clear on how much she was supposed to be taking. Please call back and advise.

## 2021-09-27 ENCOUNTER — Encounter: Payer: Self-pay | Admitting: Family Medicine

## 2021-09-27 ENCOUNTER — Ambulatory Visit (INDEPENDENT_AMBULATORY_CARE_PROVIDER_SITE_OTHER): Payer: Medicare HMO | Admitting: Family Medicine

## 2021-09-27 DIAGNOSIS — J069 Acute upper respiratory infection, unspecified: Secondary | ICD-10-CM | POA: Diagnosis not present

## 2021-09-27 DIAGNOSIS — J029 Acute pharyngitis, unspecified: Secondary | ICD-10-CM

## 2021-09-27 LAB — CULTURE, GROUP A STREP

## 2021-09-27 LAB — RAPID STREP SCREEN (MED CTR MEBANE ONLY): Strep Gp A Ag, IA W/Reflex: NEGATIVE

## 2021-09-27 MED ORDER — GUAIFENESIN-CODEINE 100-10 MG/5ML PO SYRP: 5.0000 mL | ORAL_SOLUTION | Freq: Three times a day (TID) | ORAL | 0 refills | Status: DC | PRN

## 2021-09-27 MED ORDER — PREDNISONE 20 MG PO TABS: 40.0000 mg | ORAL_TABLET | Freq: Every day | ORAL | 0 refills | Status: AC

## 2021-09-27 NOTE — Telephone Encounter (Signed)
Call returned to patient  Left voicemail  Patient should be on Ozempic 1mg  (recent increase per PCP in September) Would clarify which pen she is using. (0.5mg  pen-red label vs 1mg  pen-teal/blue label) If she needs 1mg  pen--let me know, may need to re-do patient assistance

## 2021-09-27 NOTE — Progress Notes (Signed)
   Virtual Visit  Note Due to COVID-19 pandemic this visit was conducted virtually. This visit type was conducted due to national recommendations for restrictions regarding the COVID-19 Pandemic (e.g. social distancing, sheltering in place) in an effort to limit this patient's exposure and mitigate transmission in our community. All issues noted in this document were discussed and addressed.  A physical exam was not performed with this format.  I connected with Yvonne [name] on 09/27/21 at 1315 by telephone and verified that I am speaking with the correct person using two identifiers. Yvonne [name] is currently located at home and her husband is currently with her during the visit. The provider, Gwenlyn Perking, FNP is located in their office at time of visit.  I discussed the limitations, risks, security and privacy concerns of performing an evaluation and management service by telephone and the availability of in person appointments. I also discussed with the patient that there may be a patient responsible charge related to this service. The patient expressed understanding and agreed to proceed.  CC: sore throat  History and Present Illness:  HPI Yvonne [name] reports a sore throat that started yesterday. She also reports cough, congestion, and ear pain. She denies fever, chills, shortness of breath, or chest pain. She has been taking chertussin cough syrup with some improvement. She would like a refill on this. She has had a close exposure to RSV and strep throat. She has a history of bronchitis and reports a history of pneumonia infections.     ROS As per HPI.   Observations/Objective: Alert and oriented x 3. Able to speak in full sentences without difficulty.   Assessment and Plan: Tristan was seen today for sore throat.  Diagnoses and all orders for this visit:  URI with cough and congestion Sore throat Exposure to RSV and strep. Negative rapid strep today. Prednisone burst as below.  Refill provided on cough syrup. Covid, flu, and RSV test pending. Quarantine until results. Discussed symptomatic care and return precautions.  -     guaiFENesin-codeine (ROBITUSSIN AC) 100-10 MG/5ML syrup; Take 5 mLs by mouth 3 (three) times daily as needed for cough. -     Cancel: Veritor Flu A/B Waived -     COVID-19, Flu A+B and RSV -     predniSONE (DELTASONE) 20 MG tablet; Take 2 tablets (40 mg total) by mouth daily with breakfast for 5 days.   Follow Up Instructions: Return to office for new or worsening symptoms, or if symptoms persist.     I discussed the assessment and treatment plan with the patient. The patient was provided an opportunity to ask questions and all were answered. The patient agreed with the plan and demonstrated an understanding of the instructions.   The patient was advised to call back or seek an in-person evaluation if the symptoms worsen or if the condition fails to improve as anticipated.  The above assessment and management plan was discussed with the patient. The patient verbalized understanding of and has agreed to the management plan. Patient is aware to call the clinic if symptoms persist or worsen. Patient is aware when to return to the clinic for a follow-up visit. Patient educated on when it is appropriate to go to the emergency department.   Time call ended:  1330  I provided 15 minutes of  non face-to-face time during this encounter.    Gwenlyn Perking, FNP

## 2021-09-28 LAB — COVID-19, FLU A+B AND RSV
Influenza A, NAA: NOT DETECTED
Influenza B, NAA: NOT DETECTED
RSV, NAA: NOT DETECTED
SARS-CoV-2, NAA: NOT DETECTED

## 2021-10-27 DIAGNOSIS — Z9049 Acquired absence of other specified parts of digestive tract: Secondary | ICD-10-CM | POA: Diagnosis not present

## 2021-10-27 DIAGNOSIS — I10 Essential (primary) hypertension: Secondary | ICD-10-CM | POA: Diagnosis not present

## 2021-10-27 DIAGNOSIS — E119 Type 2 diabetes mellitus without complications: Secondary | ICD-10-CM | POA: Diagnosis not present

## 2021-10-27 DIAGNOSIS — E785 Hyperlipidemia, unspecified: Secondary | ICD-10-CM | POA: Diagnosis not present

## 2021-10-27 DIAGNOSIS — M25552 Pain in left hip: Secondary | ICD-10-CM | POA: Diagnosis not present

## 2021-10-30 ENCOUNTER — Telehealth: Payer: Self-pay | Admitting: Family Medicine

## 2021-10-30 NOTE — Telephone Encounter (Signed)
Spoke with patient, appointment scheduled for 11/01/21 with Dr. [name].  Patient was seen at [facility] ER for pain in back/leg.

## 2021-11-01 ENCOUNTER — Ambulatory Visit (INDEPENDENT_AMBULATORY_CARE_PROVIDER_SITE_OTHER): Payer: Medicare HMO

## 2021-11-01 ENCOUNTER — Ambulatory Visit (INDEPENDENT_AMBULATORY_CARE_PROVIDER_SITE_OTHER): Payer: Medicare HMO | Admitting: Family Medicine

## 2021-11-01 ENCOUNTER — Other Ambulatory Visit: Payer: Self-pay

## 2021-11-01 ENCOUNTER — Encounter: Payer: Self-pay | Admitting: Family Medicine

## 2021-11-01 VITALS — BP 130/83 | HR 75 | Temp 97.7°F | Resp 20 | Ht 62.0 in | Wt 175.0 lb

## 2021-11-01 DIAGNOSIS — M5416 Radiculopathy, lumbar region: Secondary | ICD-10-CM

## 2021-11-01 DIAGNOSIS — M545 Low back pain, unspecified: Secondary | ICD-10-CM | POA: Diagnosis not present

## 2021-11-01 DIAGNOSIS — F1320 Sedative, hypnotic or anxiolytic dependence, uncomplicated: Secondary | ICD-10-CM

## 2021-11-01 DIAGNOSIS — I700 Atherosclerosis of aorta: Secondary | ICD-10-CM | POA: Diagnosis not present

## 2021-11-01 DIAGNOSIS — F411 Generalized anxiety disorder: Secondary | ICD-10-CM

## 2021-11-01 MED ORDER — ALPRAZOLAM 1 MG PO TABS
ORAL_TABLET | ORAL | 0 refills | Status: DC
Start: 2021-11-01 — End: 2021-11-30

## 2021-11-01 NOTE — Progress Notes (Signed)
Subjective:  Patient ID: Yvonne [name], female    DOB: 16-Sep-1951  Age: 70 y.o. MRN: [id number]  CC: er follow up [facility] - left hip pain - 10/27/21)   HPI Yvonne [name] presents for left hip Pain onset 1 week ago. Had slipped on slick leaves 2 days before.  Pain is improving with use of prednisone given at the E.D. She was seen at [facility] and had an x-ray of the left hip which turned out negative/normal.  However she is having pain in the lower back as well.  She is also in today asking that her Xanax be refilled.  She does suffer from chronic depression and anxiety.  GAD 7 : Generalized Anxiety Score 11/01/2021 09/05/2021 08/30/2021 08/07/2021  Nervous, Anxious, on Edge 3 3 3 3   Control/stop worrying 3 3 3 3   Worry too much - different things 3 3 3 3   Trouble relaxing 2 2 2 2   Restless 3 2 3 2   Easily annoyed or irritable 3 3 2 1   Afraid - awful might happen 3 3 3 3   Total GAD 7 Score 20 19 19 17   Anxiety Difficulty Somewhat difficult Very difficult Very difficult Somewhat difficult      Depression screen [facility] 2/9 11/01/2021 09/05/2021 08/30/2021  Decreased Interest 3 3 2   Down, Depressed, Hopeless 3 3 3   PHQ - 2 Score 6 6 5   Altered sleeping 2 2 3   Tired, decreased energy 2 2 2   Change in appetite 3 3 3   Feeling bad or failure about yourself  3 3 2   Trouble concentrating 3 2 2   Moving slowly or fidgety/restless 2 3 3   Suicidal thoughts 0 0 0  PHQ-9 Score 21 21 20   Difficult doing work/chores Somewhat difficult Very difficult Somewhat difficult  Some recent data might be hidden    History Yvonne [name] has a past medical history of Anxiety, Arthritis, Asthma, Chronic cough, Chronic otitis media (12/2017), Full dentures, GERD (gastroesophageal reflux disease), History of stroke (09/2017), Hyperlipidemia, Hypertension, Non-insulin dependent type 2 diabetes mellitus ([place]), Overactive bladder, Recurrent acute suppurative otitis media without spontaneous rupture of left  tympanic membrane (02/19/2018), and Transient cerebral ischemia.   She has a past surgical history that includes Cholecystectomy; Colonoscopy (N/A, 08/17/2015); Abdominal hysterectomy; Lacrimal duct exploration (Bilateral); Cataract extraction w/ intraocular lens implant (Right); Vitrectomy (Left, 09/14/2015); Gas insertion (Right); and Myringotomy with tube placement (Bilateral, 01/28/2018).   Her family history includes Arthritis in her brother and mother; Arthritis-Osteo in her son; Asthma in her father; CVA in her mother; Cancer in her brother; Diabetes in her father and mother; Heart disease in her father; Hepatitis C in her sister; Peripheral Artery Disease in her daughter; Stroke in her mother.She reports that she has never smoked. She has never used smokeless tobacco. She reports that she does not drink alcohol and does not use drugs.    ROS Review of Systems  Constitutional: Negative.   HENT: Negative.    Eyes:  Negative for visual disturbance.  Respiratory:  Negative for shortness of breath.   Cardiovascular:  Negative for chest pain.  Gastrointestinal:  Negative for abdominal pain.  Musculoskeletal:  Positive for arthralgias.   Objective:  BP 130/83   Pulse 75   Temp 97.7 F (36.5 C)   Resp 20   Ht 5\' 2"  (1.575 m)   Wt 175 lb (79.4 kg)   SpO2 96%   BMI 32.01 kg/m   BP Readings from Last 3 Encounters:  11/01/21  130/83  09/05/21 124/76  08/30/21 136/77    Wt Readings from Last 3 Encounters:  11/01/21 175 lb (79.4 kg)  09/05/21 176 lb 3.2 oz (79.9 kg)  08/30/21 175 lb 6.4 oz (79.6 kg)     Physical Exam Constitutional:      Appearance: She is well-developed.  HENT:     Head: Normocephalic.  Cardiovascular:     Rate and Rhythm: Normal rate and regular rhythm.     Heart sounds: No murmur heard. Pulmonary:     Effort: Pulmonary effort is normal.     Breath sounds: Normal breath sounds.  Musculoskeletal:        General: Tenderness (at the lumar spinalis to the  left approx level of L4) present.      Assessment & Plan:   [name] was seen today for er follow up.  Diagnoses and all orders for this visit:  Left lumbar radiculitis -     DG Lumbar Spine 2-3 Views; Future  GAD (generalized anxiety disorder) -     ALPRAZolam (XANAX) 1 MG tablet; ! Each morning, one each evening, and 1/2 each afternoon  Benzodiazepine dependence, continuous (HCC) -     ALPRAZolam (XANAX) 1 MG tablet; ! Each morning, one each evening, and 1/2 each afternoon      I have discontinued Yvonne [name]. Yvonne's metroNIDAZOLE and guaiFENesin-codeine. I am also having her maintain her loratadine, Accu-Chek Softclix Lancets, furosemide, Breo Ellipta, ezetimibe, glipiZIDE, atorvastatin, nitroGLYCERIN, dapagliflozin propanediol, famotidine, ofloxacin, Accu-Chek Guide, alendronate, Accu-Chek Guide, amLODipine, Ozempic (1 MG/DOSE), clopidogrel, albuterol, meloxicam, methocarbamol, and ALPRAZolam.  Allergies as of 11/01/2021       Reactions   Penicillins Hives, Itching   Has patient had a PCN reaction causing immediate rash, facial/tongue/throat swelling, SOB or lightheadedness with hypotension: Yes Has patient had a PCN reaction causing severe rash involving mucus membranes or skin necrosis: Unk Has patient had a PCN reaction that required hospitalization: No Has patient had a PCN reaction occurring within the last 10 years: No If all of the above answers are "NO", then may proceed with Cephalosporin use. Has patient had a PCN reaction causing immediate rash, facial/tongue/throat swelling, SOB or lightheadedness with hypotension: Yes Has patient had a PCN reaction causing severe rash involving mucus membranes or skin necrosis: No Has patient had a PCN reaction that required hospitalization No Has patient had a PCN reaction occurring within the last 10 years: No If all of the above answers are "NO", then may proceed with Cephalosporin use. Has patient had a PCN reaction causing  immediate rash, facial/tongue/throat swelling, SOB or lightheadedness with hypotension: Yes Has patient had a PCN reaction causing severe rash involving mucus membranes or skin necrosis: Unk Has patient had a PCN reaction that required hospitalization: No Has patient had a PCN reaction occurring within the last 10 years: No If all of the above answers are "NO", then may proceed with Cephalosporin use. Has patient had a PCN reaction causing immediate rash, facial/tongue/throat swelling, SOB or lightheadedness with hypotension: Yes Has patient had a PCN reaction causing severe rash involving mucus membranes or skin necrosis: No Has patient had a PCN reaction that required hospitalization No Has patient had a PCN reaction occurring within the last 10 years: No If all of the above answers are "NO", then may proceed with Cephalosporin use. Has patient had a PCN reaction causing immediate rash, facial/tongue/throat swelling, SOB or lightheadedness with hypotension: Yes Has patient had a PCN reaction causing sev... (TRUNCATED)   Gabapentin Other (  See Comments)   Causes a lot of lethargy at a higher dose Causes a lot of lethargy at a higher dose Causes a lot of lethargy at a higher dose   Lisinopril Cough   HAS A CHRONIC COUGH AND DID NOT NOTICE IMPROVEMENT OF COUGH WHEN LISINOPRIL WAS STOPPED HAS A CHRONIC COUGH AND DID NOT NOTICE IMPROVEMENT OF COUGH WHEN LISINOPRIL WAS STOPPED HAS A CHRONIC COUGH AND DID NOT NOTICE IMPROVEMENT OF COUGH WHEN LISINOPRIL WAS STOPPED   Soap Rash   DIAL soap        Medication List        Accurate as of November 01, 2021  8:31 PM. If you have any questions, ask your nurse or doctor.          STOP taking these medications    guaiFENesin-codeine 100-10 MG/5ML syrup Commonly known as: ROBITUSSIN AC Stopped by: [name], MD   metroNIDAZOLE 500 MG tablet Commonly known as: Flagyl Stopped by: [name], MD       TAKE these medications     Accu-Chek Guide test strip Generic drug: glucose blood TEST BLOOD SUGAR 4 TIMES DAILY Dx E11.69   Accu-Chek Guide w/Device Kit Test Bs QID Dx E11.69   Accu-Chek Softclix Lancets lancets Test BS up to 4 times daily Dx E11.69   albuterol 108 (90 Base) MCG/ACT inhaler Commonly known as: VENTOLIN HFA TAKE 2 PUFFS BY MOUTH EVERY 6 HOURS AS NEEDED FOR WHEEZE OR SHORTNESS OF BREATH   alendronate 70 MG tablet Commonly known as: FOSAMAX Take 1 tablet (70 mg total) by mouth every 7 (seven) days. Take with a full glass of water on an empty stomach.   ALPRAZolam 1 MG tablet Commonly known as: XANAX ! Each morning, one each evening, and 1/2 each afternoon   amLODipine 10 MG tablet Commonly known as: NORVASC TAKE 1 TABLET BY MOUTH EVERY DAY   atorvastatin 80 MG tablet Commonly known as: LIPITOR Take 1 tablet (80 mg total) by mouth daily.   Breo Ellipta 100-25 MCG/ACT Aepb Generic drug: fluticasone furoate-vilanterol Inhale 1 puff into the lungs daily.   clopidogrel 75 MG tablet Commonly known as: PLAVIX TAKE 1 TABLET BY MOUTH EVERY DAY WITH BREAKFAST   dapagliflozin propanediol 10 MG Tabs tablet Commonly known as: FARXIGA Take 10 mg by mouth daily.   ezetimibe 10 MG tablet Commonly known as: Zetia Take 1 tablet (10 mg total) by mouth daily. For cholesterol   famotidine 20 MG tablet Commonly known as: PEPCID famotidine 20 mg tablet  TAKE 1 TABLET BY MOUTH AT BEDTIME   furosemide 20 MG tablet Commonly known as: LASIX Take 1 tablet (20 mg total) by mouth daily.   glipiZIDE 5 MG tablet Commonly known as: GLUCOTROL Take 1 tablet (5 mg total) by mouth daily before breakfast.   loratadine 10 MG tablet Commonly known as: CLARITIN Take 1 tablet (10 mg total) by mouth daily.   meloxicam 7.5 MG tablet Commonly known as: MOBIC Take 1 tablet by mouth 2 (two) times daily as needed.   methocarbamol 500 MG tablet Commonly known as: ROBAXIN Take 1 tablet by mouth 2 (two)  times daily as needed.   nitroGLYCERIN 0.4 MG SL tablet Commonly known as: NITROSTAT Place 1 tablet (0.4 mg total) under the tongue every 5 (five) minutes as needed for chest pain.   ofloxacin 0.3 % OTIC solution Commonly known as: FLOXIN ofloxacin 0.3 % ear drops  INSTILL 5 DROPS IN EACH EAR TWICE A DAY AS NEEDED  FOR EAR PAIN   Ozempic (1 MG/DOSE) 4 MG/3ML Sopn Generic drug: Semaglutide (1 MG/DOSE) Inject 1 mg into the skin once a week.         Follow-up: Return in about 1 month (around 12/02/2021) for diabetes.  [name], M.D.

## 2021-11-06 ENCOUNTER — Ambulatory Visit: Payer: Medicare HMO | Admitting: Family Medicine

## 2021-11-13 ENCOUNTER — Telehealth: Payer: Medicare HMO

## 2021-11-28 ENCOUNTER — Ambulatory Visit: Payer: Medicare HMO | Admitting: Family Medicine

## 2021-11-29 ENCOUNTER — Other Ambulatory Visit: Payer: Self-pay | Admitting: Family Medicine

## 2021-11-29 DIAGNOSIS — E1122 Type 2 diabetes mellitus with diabetic chronic kidney disease: Secondary | ICD-10-CM

## 2021-11-29 DIAGNOSIS — N1831 Chronic kidney disease, stage 3a: Secondary | ICD-10-CM

## 2021-11-29 DIAGNOSIS — E785 Hyperlipidemia, unspecified: Secondary | ICD-10-CM

## 2021-11-30 ENCOUNTER — Encounter: Payer: Self-pay | Admitting: Family Medicine

## 2021-11-30 ENCOUNTER — Ambulatory Visit (INDEPENDENT_AMBULATORY_CARE_PROVIDER_SITE_OTHER): Payer: Medicare HMO | Admitting: Family Medicine

## 2021-11-30 ENCOUNTER — Ambulatory Visit: Payer: Medicare HMO | Admitting: Family Medicine

## 2021-11-30 ENCOUNTER — Other Ambulatory Visit: Payer: Self-pay

## 2021-11-30 VITALS — BP 118/71 | HR 80 | Temp 98.2°F | Ht 62.0 in | Wt 179.4 lb

## 2021-11-30 DIAGNOSIS — I10 Essential (primary) hypertension: Secondary | ICD-10-CM

## 2021-11-30 DIAGNOSIS — E785 Hyperlipidemia, unspecified: Secondary | ICD-10-CM

## 2021-11-30 DIAGNOSIS — E1122 Type 2 diabetes mellitus with diabetic chronic kidney disease: Secondary | ICD-10-CM | POA: Diagnosis not present

## 2021-11-30 DIAGNOSIS — N1831 Chronic kidney disease, stage 3a: Secondary | ICD-10-CM | POA: Diagnosis not present

## 2021-11-30 DIAGNOSIS — F411 Generalized anxiety disorder: Secondary | ICD-10-CM

## 2021-11-30 DIAGNOSIS — F1320 Sedative, hypnotic or anxiolytic dependence, uncomplicated: Secondary | ICD-10-CM

## 2021-11-30 LAB — BAYER DCA HB A1C WAIVED: HB A1C (BAYER DCA - WAIVED): 7.8 % — ABNORMAL HIGH (ref 4.8–5.6)

## 2021-11-30 MED ORDER — CLOPIDOGREL BISULFATE 75 MG PO TABS: ORAL_TABLET | ORAL | 3 refills | Status: DC

## 2021-11-30 MED ORDER — GLIPIZIDE 5 MG PO TABS: 5.0000 mg | ORAL_TABLET | Freq: Every day | ORAL | 3 refills | Status: DC

## 2021-11-30 MED ORDER — ALPRAZOLAM 1 MG PO TABS: ORAL_TABLET | ORAL | 5 refills | Status: DC

## 2021-11-30 MED ORDER — AMLODIPINE BESYLATE 10 MG PO TABS: ORAL_TABLET | ORAL | 3 refills | Status: DC

## 2021-11-30 MED ORDER — OZEMPIC (2 MG/DOSE) 8 MG/3ML ~~LOC~~ SOPN: 2.0000 mg | PEN_INJECTOR | SUBCUTANEOUS | 3 refills | Status: DC

## 2021-11-30 MED ORDER — EZETIMIBE 10 MG PO TABS: ORAL_TABLET | ORAL | 3 refills | Status: DC
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
## 2021-12-01 LAB — CBC WITH DIFFERENTIAL/PLATELET
Basophils Absolute: 0 10*3/uL (ref 0.0–0.2)
Basos: 1 %
EOS (ABSOLUTE): 0.1 10*3/uL (ref 0.0–0.4)
Eos: 1 %
Hematocrit: 41.1 % (ref 34.0–46.6)
Hemoglobin: 13.8 g/dL (ref 11.1–15.9)
Immature Grans (Abs): 0 10*3/uL (ref 0.0–0.1)
Immature Granulocytes: 0 %
Lymphocytes Absolute: 2.9 10*3/uL (ref 0.7–3.1)
Lymphs: 47 %
MCH: 29.2 pg (ref 26.6–33.0)
MCHC: 33.6 g/dL (ref 31.5–35.7)
MCV: 87 fL (ref 79–97)
Monocytes Absolute: 0.5 10*3/uL (ref 0.1–0.9)
Monocytes: 7 %
Neutrophils Absolute: 2.7 10*3/uL (ref 1.4–7.0)
Neutrophils: 44 %
Platelets: 288 10*3/uL (ref 150–450)
RBC: 4.73 x10E6/uL (ref 3.77–5.28)
RDW: 12.1 % (ref 11.7–15.4)
WBC: 6.2 10*3/uL (ref 3.4–10.8)

## 2021-12-01 LAB — CMP14+EGFR
ALT: 43 IU/L — ABNORMAL HIGH (ref 0–32)
AST: 72 IU/L — ABNORMAL HIGH (ref 0–40)
Albumin/Globulin Ratio: 2.4 — ABNORMAL HIGH (ref 1.2–2.2)
Albumin: 4.4 g/dL (ref 3.7–4.7)
Alkaline Phosphatase: 162 IU/L — ABNORMAL HIGH (ref 44–121)
BUN/Creatinine Ratio: 13 (ref 12–28)
BUN: 17 mg/dL (ref 8–27)
Bilirubin Total: 0.3 mg/dL (ref 0.0–1.2)
CO2: 26 mmol/L (ref 20–29)
Calcium: 9.4 mg/dL (ref 8.7–10.3)
Chloride: 108 mmol/L — ABNORMAL HIGH (ref 96–106)
Creatinine, Ser: 1.31 mg/dL — ABNORMAL HIGH (ref 0.57–1.00)
Globulin, Total: 1.8 g/dL (ref 1.5–4.5)
Glucose: 231 mg/dL — ABNORMAL HIGH (ref 70–99)
Potassium: 4.2 mmol/L (ref 3.5–5.2)
Sodium: 145 mmol/L — ABNORMAL HIGH (ref 134–144)
Total Protein: 6.2 g/dL (ref 6.0–8.5)
eGFR: 44 mL/min/{1.73_m2} — ABNORMAL LOW (ref >59)

## 2021-12-01 LAB — LIPID PANEL
Chol/HDL Ratio: 3 ratio (ref 0.0–4.4)
Cholesterol, Total: 147 mg/dL (ref 100–199)
HDL: 49 mg/dL (ref >39)
LDL Chol Calc (NIH): 66 mg/dL (ref 0–99)
Triglycerides: 197 mg/dL — ABNORMAL HIGH (ref 0–149)
VLDL Cholesterol Cal: 32 mg/dL (ref 5–40)

## 2021-12-04 ENCOUNTER — Other Ambulatory Visit: Payer: Self-pay | Admitting: *Deleted

## 2021-12-04 DIAGNOSIS — R748 Abnormal levels of other serum enzymes: Secondary | ICD-10-CM

## 2021-12-04 NOTE — Progress Notes (Deleted)
NEUROLOGY CONSULTATION NOTE  Yvonne [name] MRN: [id number] DOB: [birth year]  Referring provider: [name], MD Primary care provider: [name], MD  Reason for consult:  headaches, ICH  Assessment/Plan:   ***   Subjective:  Yvonne [name] is a 71 year old right-handed female with HTN, HLD, DM II and history of stroke with right-sided numbness and weeakness who presents for headaches and ICH.  History supplemented by ED, PCP and prior neurologist's notes.  MRI of brain from 08/22/2021 personally reviewed.  On 05/22/2021, she fell *** striking her head ***.  She went to the ED at [facility] where CT of head was reportedly normal.  She received 11 stitches and was sent home.  She continued to have ***.  Due to continued daily headaches, she had an MRI of brain without contrast performed on 08/22/2021 showed 2 mm chronic microhemorrhage within the left temporoparietal junction and known chronic lacunar infracts within the left corona radiata, left thalamus and right cerebellar hemisphere but no acute or subacute intracranial abnormalities.    Current NSAIDS/analgesics:  *** Current triptans:  *** Current ergotamine:  *** Current anti-emetic:  *** Current muscle relaxants:  *** Current Antihypertensive medications:  amlodipine, furosemide Current Antidepressant medications:  *** Current Anticonvulsant medications:  *** Current anti-CGRP:  *** Current Vitamins/Herbal/Supplements:  *** Current Antihistamines/Decongestants:  loraditine Other therapy:  *** Hormone/birth control:  *** Other medications:  Plavix $Remov'75mg'eWrxyo$ , atorvastatin $RemoveBefore'80mg'MkjtgPNuyuPov$ , Farxiga, Omzempic, albuterol  Past NSAIDS/analgesics:  Fioricet, diclofenac Past abortive triptans:  *** Past abortive ergotamine:  *** Past muscle relaxants:  *** Past anti-emetic:  *** Past antihypertensive medications:  metoprolol Past antidepressant medications:  venlafaxine Past anticonvulsant medications:  topiramate, gabapentin Past  anti-CGRP:  *** Past vitamins/Herbal/Supplements:  *** Past antihistamines/decongestants:  *** Other past therapies:  ***  Caffeine:  *** Alcohol:  *** Smoker:  *** Diet:  *** Exercise:  *** Depression:  ***; Anxiety:  *** Other pain:  *** Sleep hygiene:  *** Family history of headache:  ***        PAST MEDICAL HISTORY: Past Medical History:  Diagnosis Date   Anxiety    Arthritis    left hand   Asthma    daily and prn inhalers   Chronic cough    Chronic otitis media 12/2017   Full dentures    GERD (gastroesophageal reflux disease)    History of stroke 09/2017   weakness right hand, numbness right side face   Hyperlipidemia    Hypertension    states under control with meds., has been on med. x a long time, per pt.   Non-insulin dependent type 2 diabetes mellitus ([place])    Overactive bladder    Recurrent acute suppurative otitis media without spontaneous rupture of left tympanic membrane 02/19/2018   Transient cerebral ischemia     PAST SURGICAL HISTORY: Past Surgical History:  Procedure Laterality Date   ABDOMINAL HYSTERECTOMY     complete   CATARACT EXTRACTION W/ INTRAOCULAR LENS IMPLANT Right    CHOLECYSTECTOMY     COLONOSCOPY N/A 08/17/2015   Procedure: COLONOSCOPY;  Surgeon: Rogene Houston, MD;  Location: [facility];  Service: Endoscopy;  Laterality: N/A;  200 - moved to 7:30 - Ann notified pt   GAS INSERTION Right    x 2 - eye   LACRIMAL DUCT EXPLORATION Bilateral    removal of tear ducts   MYRINGOTOMY WITH TUBE PLACEMENT Bilateral 01/28/2018   Procedure: BILATERAL MYRINGOTOMY WITH TUBE PLACEMENT;  Surgeon: Leta Baptist, MD;  Location: [place];  Service: ENT;  Laterality: Bilateral;   VITRECTOMY Left 09/14/2015    MEDICATIONS: Current Outpatient Medications on File Prior to Visit  Medication Sig Dispense Refill   Accu-Chek Softclix Lancets lancets Test BS up to 4 times daily Dx E11.69 400 each 3   albuterol (VENTOLIN HFA) 108 (90 Base)  MCG/ACT inhaler TAKE 2 PUFFS BY MOUTH EVERY 6 HOURS AS NEEDED FOR WHEEZE OR SHORTNESS OF BREATH 54 each 0   alendronate (FOSAMAX) 70 MG tablet Take 1 tablet (70 mg total) by mouth every 7 (seven) days. Take with a full glass of water on an empty stomach. (Patient not taking: Reported on 11/30/2021) 13 tablet 3   ALPRAZolam (XANAX) 1 MG tablet ! Each morning, one each evening, and 1/2 each afternoon 75 tablet 5   amLODipine (NORVASC) 10 MG tablet TAKE 1 TABLET BY MOUTH EVERY DAY 90 tablet 3   atorvastatin (LIPITOR) 80 MG tablet Take 1 tablet (80 mg total) by mouth daily. 90 tablet 2   Blood Glucose Monitoring Suppl (ACCU-CHEK GUIDE) w/Device KIT Test Bs QID Dx E11.69 1 kit 0   clopidogrel (PLAVIX) 75 MG tablet TAKE 1 TABLET BY MOUTH EVERY DAY WITH BREAKFAST 90 tablet 3   dapagliflozin propanediol (FARXIGA) 10 MG TABS tablet Take 10 mg by mouth daily.     ezetimibe (ZETIA) 10 MG tablet TAKE 1 TABLET BY MOUTH DAILY. FOR CHOLESTEROL 90 tablet 3   famotidine (PEPCID) 20 MG tablet famotidine 20 mg tablet  TAKE 1 TABLET BY MOUTH AT BEDTIME     fluticasone furoate-vilanterol (BREO ELLIPTA) 100-25 MCG/INH AEPB Inhale 1 puff into the lungs daily. 180 each 1   furosemide (LASIX) 20 MG tablet Take 1 tablet (20 mg total) by mouth daily. 90 tablet 1   glipiZIDE (GLUCOTROL) 5 MG tablet Take 1 tablet (5 mg total) by mouth daily before breakfast. 90 tablet 3   glucose blood (ACCU-CHEK GUIDE) test strip TEST BLOOD SUGAR 4 TIMES DAILY Dx E11.69 400 strip 3   loratadine (CLARITIN) 10 MG tablet Take 1 tablet (10 mg total) by mouth daily. 90 tablet 3   meloxicam (MOBIC) 7.5 MG tablet Take 1 tablet by mouth 2 (two) times daily as needed. (Patient not taking: Reported on 11/30/2021)     nitroGLYCERIN (NITROSTAT) 0.4 MG SL tablet Place 1 tablet (0.4 mg total) under the tongue every 5 (five) minutes as needed for chest pain. 25 tablet 10   ofloxacin (FLOXIN) 0.3 % OTIC solution ofloxacin 0.3 % ear drops  INSTILL 5 DROPS IN  EACH EAR TWICE A DAY AS NEEDED FOR EAR PAIN     Semaglutide, 2 MG/DOSE, (OZEMPIC, 2 MG/DOSE,) 8 MG/3ML SOPN Inject 2 mg into the skin once a week. 9 mL 3   solifenacin (VESICARE) 10 MG tablet Take 10 mg by mouth daily.     No current facility-administered medications on file prior to visit.    ALLERGIES: Allergies  Allergen Reactions   Penicillins Hives and Itching    Has patient had a PCN reaction causing immediate rash, facial/tongue/throat swelling, SOB or lightheadedness with hypotension: Yes Has patient had a PCN reaction causing severe rash involving mucus membranes or skin necrosis: Unk Has patient had a PCN reaction that required hospitalization: No Has patient had a PCN reaction occurring within the last 10 years: No If all of the above answers are "NO", then may proceed with Cephalosporin use.  Has patient had a PCN reaction causing immediate rash, facial/tongue/throat swelling,  SOB or lightheadedness with hypotension: Yes Has patient had a PCN reaction causing severe rash involving mucus membranes or skin necrosis: No Has patient had a PCN reaction that required hospitalization No Has patient had a PCN reaction occurring within the last 10 years: No If all of the above answers are "NO", then may proceed with Cephalosporin use. Has patient had a PCN reaction causing immediate rash, facial/tongue/throat swelling, SOB or lightheadedness with hypotension: Yes Has patient had a PCN reaction causing severe rash involving mucus membranes or skin necrosis: Unk Has patient had a PCN reaction that required hospitalization: No Has patient had a PCN reaction occurring within the last 10 years: No If all of the above answers are "NO", then may proceed with Cephalosporin use. Has patient had a PCN reaction causing immediate rash, facial/tongue/throat swelling, SOB or lightheadedness with hypotension: Yes Has patient had a PCN reaction causing severe rash involving mucus membranes or skin  necrosis: No Has patient had a PCN reaction that required hospitalization No Has patient had a PCN reaction occurring within the last 10 years: No If all of the above answers are "NO", then may proceed with Cephalosporin use. Has patient had a PCN reaction causing immediate rash, facial/tongue/throat swelling, SOB or lightheadedness with hypotension: Yes Has patient had a PCN reaction causing sev... (TRUNCATED)   Gabapentin Other (See Comments)    Causes a lot of lethargy at a higher dose Causes a lot of lethargy at a higher dose Causes a lot of lethargy at a higher dose   Lisinopril Cough    HAS A CHRONIC COUGH AND DID NOT NOTICE IMPROVEMENT OF COUGH WHEN LISINOPRIL WAS STOPPED HAS A CHRONIC COUGH AND DID NOT NOTICE IMPROVEMENT OF COUGH WHEN LISINOPRIL WAS STOPPED HAS A CHRONIC COUGH AND DID NOT NOTICE IMPROVEMENT OF COUGH WHEN LISINOPRIL WAS STOPPED   Soap Rash    DIAL soap    FAMILY HISTORY: Family History  Problem Relation Age of Onset   Arthritis Mother    Diabetes Mother    CVA Mother    Stroke Mother    Diabetes Father    Heart disease Father        CABG.  Does not know age of onset   Asthma Father    Hepatitis C Sister    Arthritis Brother    Cancer Brother        metastic cancer   Peripheral Artery Disease Daughter    Arthritis-Osteo Son     Objective:  *** General: No acute distress.  Patient appears well-groomed.   Head:  Normocephalic/atraumatic Eyes:  fundi examined but not visualized Neck: supple, no paraspinal tenderness, full range of motion Back: No paraspinal tenderness Heart: regular rate and rhythm Lungs: Clear to auscultation bilaterally. Vascular: No carotid bruits. Neurological Exam: Mental status: alert and oriented to person, place, and time, recent and remote memory intact, fund of knowledge intact, attention and concentration intact, speech fluent and not dysarthric, language intact. Cranial nerves: CN I: not tested CN II: pupils equal,  round and reactive to light, visual fields intact CN III, IV, VI:  full range of motion, no nystagmus, no ptosis CN V: facial sensation intact. CN VII: upper and lower face symmetric CN VIII: hearing intact CN IX, X: gag intact, uvula midline CN XI: sternocleidomastoid and trapezius muscles intact CN XII: tongue midline Bulk & Tone: normal, no fasciculations. Motor:  muscle strength 5/5 throughout Sensation:  Pinprick, temperature and vibratory sensation intact. Deep Tendon Reflexes:  2+ throughout,  toes downgoing.   Finger to nose testing:  Without dysmetria.   Heel to shin:  Without dysmetria.   Gait:  Normal station and stride.  Romberg negative.    Thank you for allowing me to take part in the care of this patient.  Metta Clines, DO  CC: ***

## 2021-12-05 ENCOUNTER — Ambulatory Visit: Payer: Medicare HMO | Admitting: Neurology

## 2021-12-05 ENCOUNTER — Ambulatory Visit (INDEPENDENT_AMBULATORY_CARE_PROVIDER_SITE_OTHER): Payer: Medicare HMO | Admitting: Pharmacist

## 2021-12-05 DIAGNOSIS — E785 Hyperlipidemia, unspecified: Secondary | ICD-10-CM

## 2021-12-05 DIAGNOSIS — N1831 Chronic kidney disease, stage 3a: Secondary | ICD-10-CM

## 2021-12-05 NOTE — Patient Instructions (Addendum)
Visit Information  Thank you for taking time to visit with me today. Please don't hesitate to contact me if I can be of assistance to you before our next scheduled telephone appointment.  Following are the goals we discussed today:  Current Barriers:  Unable to independently afford treatment regimen Unable to maintain control of T2DM, HLD Suboptimal therapeutic regimen for T2DM, HLD  Pharmacist Clinical Goal(s):  patient will verbalize ability to afford treatment regimen maintain control of T2DM, HLD as evidenced by GOAL A1C<7%, LDL<70  adhere to plan to optimize therapeutic regimen for T2DM, HLD as evidenced by report of adherence to recommended medication management changes through collaboration with [name] and provider.    Interventions: 1:1 collaboration with [name], MD regarding development and update of comprehensive plan of care as evidenced by provider attestation and co-signature Inter-disciplinary care team collaboration (see longitudinal plan of care) Comprehensive medication review performed; medication list updated in electronic medical record  Diabetes: New goal. Uncontrolled-7.8%; current treatment:OZEMPIC 2MG , FARXIGA 10MG ;, GLIPIZIDE  PATIENT HAS BEEN UNABLE TO GET THESE MEDICATIONS DUE TO COST WILL APPLY FOR PATIENT ASSISTANCE  Denies personal and family history of Medullary thyroid cancer (MTC) GFR 44 WORK TO D/C GLIPIZIDE Current glucose readings: fasting glucose: <180, post prandial glucose: 200s Denies hypoglycemic/hyperglycemic symptoms Discussed meal planning options and Plate method for healthy eating Avoid sugary drinks and desserts Incorporate balanced protein, non starchy veggies, 1 serving of carbohydrate with each meal Increase water intake Increase physical activity as able Current exercise: N/A; ENCOURAGED Recommended RESTART MEDICATIONS; WILL WORK UP TO OZEMPIC 2MG  DOSE Assessed patient finances. WILL APPLY FOR NOV[place] AND [place] FOR  OZEMPIC AND FARXIGA RESPECTIVELY  Hyperlipidemia:  New goal. controlled; current treatment:ZETIA, ATORVASTATIN 80MG ;  Medications previously tried: N/A Current dietary patterns: RECOMMENDED HEART HEALTHY  Lipid Panel     Component Value Date/Time   CHOL 147 11/30/2021 1450   TRIG 197 (H) 11/30/2021 1450   HDL 49 11/30/2021 1450   CHOLHDL 3.0 11/30/2021 1450   CHOLHDL 5.7 06/11/2019 0622   VLDL 39 06/11/2019 0622   LDLCALC 66 11/30/2021 1450   LABVLDL 32 11/30/2021 1450     Patient Goals/Self-Care Activities patient will:  - take medications as prescribed as evidenced by patient report and record review check glucose DAILY, document, and provide at future appointments collaborate with provider on medication access solutions target a minimum of 150 minutes of moderate intensity exercise weekly engage in dietary modifications by [place], HEART HEALTHY     Please call the care guide team at [phone] if you need to cancel or reschedule your appointment.   The patient verbalized understanding of instructions, educational materials, and care plan provided today and declined offer to receive copy of patient instructions, educational materials, and care plan.   Signature Regina Eck, [name], BCPS Clinical Pharmacist, [place]  II Phone [phone]

## 2021-12-05 NOTE — Progress Notes (Signed)
Chronic Care Management Pharmacy Note  12/05/2021 Name:  Yvonne [name] MRN:  [id number] DOB:  07/28/1951  Summary: T2DM, HLD  Recommendations/Changes made from today's visit:  Diabetes: New goal. Uncontrolled-7.8%; current treatment:OZEMPIC 2MG, FARXIGA 10MG;, GLIPIZIDE  PATIENT HAS BEEN UNABLE TO GET THESE MEDICATIONS DUE TO COST WILL APPLY FOR PATIENT ASSISTANCE  Denies personal and family history of Medullary thyroid cancer (MTC) GFR 44 WORK TO D/C GLIPIZIDE Current glucose readings: fasting glucose: <180, post prandial glucose: 200s Denies hypoglycemic/hyperglycemic symptoms Discussed meal planning options and Plate method for healthy eating Avoid sugary drinks and desserts Incorporate balanced protein, non starchy veggies, 1 serving of carbohydrate with each meal Increase water intake Increase physical activity as able Current exercise: N/A; ENCOURAGED Recommended RESTART MEDICATIONS; WILL WORK UP TO OZEMPIC 2MG DOSE Assessed patient finances. WILL APPLY FOR [place] AND [place] FOR OZEMPIC AND FARXIGA RESPECTIVELY  Hyperlipidemia:  New goal. controlled; current treatment:ZETIA, ATORVASTATIN 80MG;  Medications previously tried: N/A Current dietary patterns: RECOMMENDED HEART HEALTHY  Lipid Panel     Component Value Date/Time   CHOL 147 11/30/2021 1450   TRIG 197 (H) 11/30/2021 1450   HDL 49 11/30/2021 1450   CHOLHDL 3.0 11/30/2021 1450   CHOLHDL 5.7 06/11/2019 0622   VLDL 39 06/11/2019 0622   LDLCALC 66 11/30/2021 1450   LABVLDL 32 11/30/2021 1450   Patient Goals/Self-Care Activities patient will:  - take medications as prescribed as evidenced by patient report and record review check glucose DAILY, document, and provide at future appointments collaborate with provider on medication access solutions target a minimum of 150 minutes of moderate intensity exercise weekly engage in dietary modifications by HEALTHY PLATE METHOD, HEART HEALTHY   Plan: 3  MONTHS  Subjective: Yvonne [name] is an 71 y.o. year old female who is a primary patient of [name], [name], MD.  The CCM team was consulted for assistance with disease management and care coordination needs.    Engaged with patient by telephone for initial visit in response to provider referral for pharmacy case management and/or care coordination services.   Consent to Services:  The patient was given information about Chronic Care Management services, agreed to services, and gave verbal consent prior to initiation of services.  Please see initial visit note for detailed documentation.   Patient Care Team: [name], MD as PCP - General (Family Medicine) Vaughan Basta, Rona Ravens, NP (Inactive) as Nurse Practitioner (Internal Medicine) Pieter Partridge, DO as Consulting Physician (Neurology) Cleon Gustin, MD (Anesthesiology) Shea Evans [name], [name] as Social Worker (Licensed Clinical Social Worker) Ilean China, RN as Case Manager , Royce Macadamia, [facility] (Pharmacist) Werner Lean, MD as Consulting Physician (Cardiology)  Objective:  Lab Results  Component Value Date   CREATININE 1.31 (H) 11/30/2021   CREATININE 1.05 (H) 08/07/2021   CREATININE 0.96 05/04/2021    Lab Results  Component Value Date   HGBA1C 7.8 (H) 11/30/2021   Last diabetic Eye exam: No results found for: HMDIABEYEEXA  Last diabetic Foot exam: No results found for: HMDIABFOOTEX      Component Value Date/Time   CHOL 147 11/30/2021 1450   TRIG 197 (H) 11/30/2021 1450   HDL 49 11/30/2021 1450   CHOLHDL 3.0 11/30/2021 1450   CHOLHDL 5.7 06/11/2019 0622   VLDL 39 06/11/2019 0622   LDLCALC 66 11/30/2021 1450    Hepatic Function Latest Ref Rng & Units 11/30/2021 08/07/2021 05/04/2021  Total Protein 6.0 - 8.5 g/dL 6.2 6.2 6.1  Albumin 3.7 -  4.7 g/dL 4.4 4.0 4.0  AST 0 - 40 IU/L 72(H) 17 28  ALT 0 - 32 IU/L 43(H) 31 38(H)  Alk Phosphatase 44 - 121 IU/L 162(H) 162(H) 167(H)  Total Bilirubin 0.0 - 1.2  mg/dL 0.3 0.3 0.3    Lab Results  Component Value Date/Time   TSH 3.040 06/20/2018 10:11 AM   TSH 1.580 06/03/2015 12:10 PM    CBC Latest Ref Rng & Units 11/30/2021 08/07/2021 05/04/2021  WBC 3.4 - 10.8 x10E3/uL 6.2 5.4 7.0  Hemoglobin 11.1 - 15.9 g/dL 13.8 13.3 13.8  Hematocrit 34.0 - 46.6 % 41.1 39.0 40.3  Platelets 150 - 450 x10E3/uL 288 240 245    Lab Results  Component Value Date/Time   VD25OH 14.9 (L) 05/03/2016 08:07 AM   VD25OH 20.0 (L) 06/03/2015 12:10 PM    Clinical ASCVD: No  The 10-year ASCVD risk score (Arnett DK, et al., 2019) is: 20.7%   Values used to calculate the score:     Age: 32 years     Sex: Female     Is Non-Hispanic African American: No     Diabetic: Yes     Tobacco smoker: No     Systolic Blood Pressure: 161 mmHg     Is BP treated: Yes     HDL Cholesterol: 49 mg/dL     Total Cholesterol: 147 mg/dL    Other: (CHADS2VASc if Afib, PHQ9 if depression, MMRC or CAT for COPD, ACT, DEXA)  Social History   Tobacco Use  Smoking Status Never  Smokeless Tobacco Never   BP Readings from Last 3 Encounters:  11/30/21 118/71  11/01/21 130/83  09/05/21 124/76   Pulse Readings from Last 3 Encounters:  11/30/21 80  11/01/21 75  09/05/21 70   Wt Readings from Last 3 Encounters:  11/30/21 179 lb 6 oz (81.4 kg)  11/01/21 175 lb (79.4 kg)  09/05/21 176 lb 3.2 oz (79.9 kg)    Assessment: Review of patient past medical history, allergies, medications, health status, including review of consultants reports, laboratory and other test data, was performed as part of comprehensive evaluation and provision of chronic care management services.   SDOH:  (Social Determinants of Health) assessments and interventions performed:    CCM Care Plan  Allergies  Allergen Reactions   Penicillins Hives and Itching    Has patient had a PCN reaction causing immediate rash, facial/tongue/throat swelling, SOB or lightheadedness with hypotension: Yes Has patient had a PCN  reaction causing severe rash involving mucus membranes or skin necrosis: Unk Has patient had a PCN reaction that required hospitalization: No Has patient had a PCN reaction occurring within the last 10 years: No If all of the above answers are "NO", then may proceed with Cephalosporin use.  Has patient had a PCN reaction causing immediate rash, facial/tongue/throat swelling, SOB or lightheadedness with hypotension: Yes Has patient had a PCN reaction causing severe rash involving mucus membranes or skin necrosis: No Has patient had a PCN reaction that required hospitalization No Has patient had a PCN reaction occurring within the last 10 years: No If all of the above answers are "NO", then may proceed with Cephalosporin use. Has patient had a PCN reaction causing immediate rash, facial/tongue/throat swelling, SOB or lightheadedness with hypotension: Yes Has patient had a PCN reaction causing severe rash involving mucus membranes or skin necrosis: Unk Has patient had a PCN reaction that required hospitalization: No Has patient had a PCN reaction occurring within the last 10 years: No If  all of the above answers are "NO", then may proceed with Cephalosporin use. Has patient had a PCN reaction causing immediate rash, facial/tongue/throat swelling, SOB or lightheadedness with hypotension: Yes Has patient had a PCN reaction causing severe rash involving mucus membranes or skin necrosis: No Has patient had a PCN reaction that required hospitalization No Has patient had a PCN reaction occurring within the last 10 years: No If all of the above answers are "NO", then may proceed with Cephalosporin use. Has patient had a PCN reaction causing immediate rash, facial/tongue/throat swelling, SOB or lightheadedness with hypotension: Yes Has patient had a PCN reaction causing sev... (TRUNCATED)   Gabapentin Other (See Comments)    Causes a lot of lethargy at a higher dose Causes a lot of lethargy at a higher  dose Causes a lot of lethargy at a higher dose   Lisinopril Cough    HAS A CHRONIC COUGH AND DID NOT NOTICE IMPROVEMENT OF COUGH WHEN LISINOPRIL WAS STOPPED HAS A CHRONIC COUGH AND DID NOT NOTICE IMPROVEMENT OF COUGH WHEN LISINOPRIL WAS STOPPED HAS A CHRONIC COUGH AND DID NOT NOTICE IMPROVEMENT OF COUGH WHEN LISINOPRIL WAS STOPPED   Soap Rash    DIAL soap    Medications Reviewed Today     Reviewed by Lavera Guise, [facility] (Pharmacist) on 12/05/21 at 1035  Med List Status: <None>   Medication Order Taking? Sig Documenting Provider Last Dose Status Informant  Accu-Chek Softclix Lancets lancets [id number] No Test BS up to 4 times daily Dx E11.69 [name], MD Taking Active   albuterol (VENTOLIN HFA) 108 (90 Base) MCG/ACT inhaler [id number] No TAKE 2 PUFFS BY MOUTH EVERY 6 HOURS AS NEEDED FOR WHEEZE OR SHORTNESS OF Belenda Cruise, MD Taking Active   alendronate (FOSAMAX) 70 MG tablet [id number] No Take 1 tablet (70 mg total) by mouth every 7 (seven) days. Take with a full glass of water on an empty stomach.  Patient not taking: Reported on 11/30/2021   [name], MD Not Taking Active   ALPRAZolam Duanne Moron) 1 MG tablet [id number]  ! Each morning, one each evening, and 1/2 each afternoon [name], MD  Active   amLODipine (NORVASC) 10 MG tablet [id number]  TAKE 1 TABLET BY MOUTH EVERY DAY [name], MD  Active   atorvastatin (LIPITOR) 80 MG tablet [id number] No Take 1 tablet (80 mg total) by mouth daily. Werner Lean, MD Taking Active   Blood Glucose Monitoring Suppl (ACCU-CHEK GUIDE) w/Device KIT [id number] No Test Bs QID Dx E11.69 [name], MD Taking Active   clopidogrel (PLAVIX) 75 MG tablet [id number]  TAKE 1 TABLET BY MOUTH EVERY DAY WITH Gara Kroner, MD  Active   dapagliflozin propanediol (FARXIGA) 10 MG TABS tablet [id number] No Take 10 mg by mouth daily. [provider] Taking Active   ezetimibe (ZETIA) 10 MG tablet [id number]  TAKE 1  TABLET BY MOUTH DAILY. FOR CHOLESTEROL [name], MD  Active   famotidine (PEPCID) 20 MG tablet [id number] No famotidine 20 mg tablet  TAKE 1 TABLET BY MOUTH AT BEDTIME [provider] Taking Active   fluticasone furoate-vilanterol (BREO ELLIPTA) 100-25 MCG/INH AEPB [id number] No Inhale 1 puff into the lungs daily. [name], MD Taking Active   furosemide (LASIX) 20 MG tablet [id number] No Take 1 tablet (20 mg total) by mouth daily. [name], MD Taking Active   glipiZIDE (GLUCOTROL) 5 MG tablet [id number]  Take 1 tablet (5 mg total) by mouth daily before breakfast. [name], MD  Active   glucose blood (ACCU-CHEK GUIDE) test strip [id number] No TEST BLOOD SUGAR 4 TIMES DAILY Dx E11.69 [name], MD Taking Active   loratadine (CLARITIN) 10 MG tablet [id number] No Take 1 tablet (10 mg total) by mouth daily. [name], MD Taking Active Self  meloxicam (MOBIC) 7.5 MG tablet [id number] No Take 1 tablet by mouth 2 (two) times daily as needed.  Patient not taking: Reported on 11/30/2021   [provider] Not Taking Active   nitroGLYCERIN (NITROSTAT) 0.4 MG SL tablet [id number] No Place 1 tablet (0.4 mg total) under the tongue every 5 (five) minutes as needed for chest pain. Werner Lean, MD Taking Active   ofloxacin (FLOXIN) 0.3 % OTIC solution [id number] No ofloxacin 0.3 % ear drops  INSTILL 5 DROPS IN EACH EAR TWICE A DAY AS NEEDED FOR EAR PAIN [provider] Taking Active   Semaglutide, 2 MG/DOSE, (OZEMPIC, 2 MG/DOSE,) 8 MG/3ML SOPN [id number]  Inject 2 mg into the skin once a week. [name], MD  Active   solifenacin (VESICARE) 10 MG tablet [id number]  Take 10 mg by mouth daily. [provider]  Active             Patient Active Problem List   Diagnosis Date Noted   Ear pain, right 06/13/2021   Restless legs 02/27/2021   Insomnia 02/27/2021   Polyneuropathy due to type 2 diabetes mellitus ([place]) 02/27/2021   Palpitations  01/20/2021   Aortic atherosclerosis ([place]) 01/20/2021   Orthostatic hypotension 01/20/2021   Carpal tunnel syndrome 08/23/2020   Diabetic lipidosis ([place]) 09/09/2019   Hypokalemia 12/11/2018   Benzodiazepine dependence, continuous ([place]) 10/22/2018   Chronic migraine without aura without status migrainosus, not intractable 02/19/2018   Chronic diastolic CHF (congestive heart failure) ([place]) 03/21/2017   Chronic bilateral low back pain with bilateral sciatica 02/27/2017   Spondylosis of lumbar region without myelopathy or radiculopathy 08/15/2016   Pain of both hip joints 06/18/2016   Pain syndrome, chronic 06/18/2016   Chronic cough 05/17/2016   Hyperlipidemia with target LDL less than 70 11/04/2015   Macular hole of right eye 09/14/2015   Obesity 07/20/2015   Osteopenia 07/20/2015   Precordial pain 07/06/2015   Overactive bladder    Hypertension    GAD (generalized anxiety disorder)    Acid reflux disease 08/17/2014    Immunization History  Administered Date(s) Administered   Fluad Quad(high Dose 65+) 08/30/2021   Influenza Split 08/18/2015   Influenza,inj,Quad PF,6+ Mos 10/22/2018   Influenza-Unspecified 09/14/2013   Moderna Sars-Covid-2 Vaccination 06/14/2020, 07/12/2020   Pneumococcal Conjugate-13 01/06/2015   Pneumococcal Polysaccharide-23 10/22/2018   Tdap 06/06/2017, 05/22/2021   Zoster Recombinat (Shingrix) 06/06/2017, 02/26/2018    Conditions to be addressed/monitored: HLD, DMII, and CKD Stage 3B  Care Plan : [name] MEDICATION MANAGEMENT  Updates made by Lavera Guise, RPH since 12/05/2021 12:00 AM     Problem: DISEASE PROGRESSION PREVENTION      Long-Range Goal: T2DM, HLD [name] GOAL   This Visit's Progress: Not on track  Note:   Current Barriers:  Unable to independently afford treatment regimen Unable to maintain control of T2DM, HLD Suboptimal therapeutic regimen for T2DM, HLD  Pharmacist Clinical Goal(s):  patient will verbalize ability to afford  treatment regimen maintain control of T2DM, HLD as evidenced by GOAL A1C<7%, LDL<70  adhere to plan to optimize therapeutic regimen for T2DM, HLD as evidenced by report of adherence to recommended medication management changes through collaboration with [name] and provider.  Interventions: 1:1 collaboration with [name], MD regarding development and update of comprehensive plan of care as evidenced by provider attestation and co-signature Inter-disciplinary care team collaboration (see longitudinal plan of care) Comprehensive medication review performed; medication list updated in electronic medical record  Diabetes: New goal. Uncontrolled-7.8%; current treatment:OZEMPIC 2MG, FARXIGA 10MG;, GLIPIZIDE  PATIENT HAS BEEN UNABLE TO GET THESE MEDICATIONS DUE TO COST WILL APPLY FOR PATIENT ASSISTANCE  Denies personal and family history of Medullary thyroid cancer (MTC) GFR 44 WORK TO D/C GLIPIZIDE Current glucose readings: fasting glucose: <180, post prandial glucose: 200s Denies hypoglycemic/hyperglycemic symptoms Discussed meal planning options and Plate method for healthy eating Avoid sugary drinks and desserts Incorporate balanced protein, non starchy veggies, 1 serving of carbohydrate with each meal Increase water intake Increase physical activity as able Current exercise: N/A; ENCOURAGED Recommended RESTART MEDICATIONS; WILL WORK UP TO OZEMPIC 2MG DOSE Assessed patient finances. WILL APPLY FOR NOVO [place] AND [place] FOR OZEMPIC AND FARXIGA RESPECTIVELY  Hyperlipidemia:  New goal. controlled; current treatment:ZETIA, ATORVASTATIN 80MG;  Medications previously tried: N/A Current dietary patterns: RECOMMENDED HEART HEALTHY  Lipid Panel     Component Value Date/Time   CHOL 147 11/30/2021 1450   TRIG 197 (H) 11/30/2021 1450   HDL 49 11/30/2021 1450   CHOLHDL 3.0 11/30/2021 1450   CHOLHDL 5.7 06/11/2019 0622   VLDL 39 06/11/2019 0622   LDLCALC 66 11/30/2021 1450    LABVLDL 32 11/30/2021 1450    Patient Goals/Self-Care Activities patient will:  - take medications as prescribed as evidenced by patient report and record review check glucose DAILY, document, and provide at future appointments collaborate with provider on medication access solutions target a minimum of 150 minutes of moderate intensity exercise weekly engage in dietary modifications by HEALTHY PLATE METHOD, HEART HEALTHY      Medication Assistance: Application for [place]  medication assistance program. in process.  Anticipated assistance start date tbd.  See plan of care for additional detail.  Patient's preferred pharmacy is:  [id number] [place], [address]Phone: [phone]: [phone] [facility] [place]: [phone]: [phone]  Follow Up:  Patient agrees to Care Plan and Follow-up.  Plan: Telephone follow up appointment with care management team member scheduled for:  3 MONTHS  JRegina Eck [name], BCPS Clinical Pharmacist, [place] Phone [phone]

## 2021-12-07 ENCOUNTER — Ambulatory Visit (INDEPENDENT_AMBULATORY_CARE_PROVIDER_SITE_OTHER): Payer: Medicare HMO | Admitting: Nurse Practitioner

## 2021-12-07 ENCOUNTER — Encounter: Payer: Self-pay | Admitting: Nurse Practitioner

## 2021-12-07 VITALS — BP 130/77 | HR 64 | Temp 97.6°F | Resp 20 | Ht 62.0 in | Wt 172.0 lb

## 2021-12-07 DIAGNOSIS — J069 Acute upper respiratory infection, unspecified: Secondary | ICD-10-CM | POA: Diagnosis not present

## 2021-12-07 DIAGNOSIS — J029 Acute pharyngitis, unspecified: Secondary | ICD-10-CM | POA: Diagnosis not present

## 2021-12-07 LAB — RAPID STREP SCREEN (MED CTR MEBANE ONLY): Strep Gp A Ag, IA W/Reflex: NEGATIVE

## 2021-12-07 LAB — CULTURE, GROUP A STREP

## 2021-12-07 NOTE — Progress Notes (Signed)
Subjective:    Patient ID: Yvonne [name], female    DOB: Jan 08, 1951, 71 y.o.   MRN: [id number]   Chief Complaint: Headache, Cough, and ears hurt   HPI Patient comes in today c/o headache cough and ear pain. Started about 2 days ago. Has had slight nausea and vomiting. Her grandson tested positive for strep yesterday. She has not taken any OTC meds.    Review of Systems  Constitutional:  Positive for fatigue. Negative for chills, diaphoresis and fever.  HENT:  Positive for congestion, rhinorrhea and sore throat (scratchy).   Eyes:  Negative for pain.  Respiratory:  Negative for shortness of breath.   Cardiovascular:  Negative for chest pain, palpitations and leg swelling.  Gastrointestinal:  Negative for abdominal pain.  Endocrine: Negative for polydipsia.  Musculoskeletal:  Negative for myalgias.  Skin:  Negative for rash.  Neurological:  Negative for dizziness, weakness and headaches.  Hematological:  Does not bruise/bleed easily.  All other systems reviewed and are negative.     Objective:   Physical Exam Constitutional:      Appearance: She is well-developed. She is obese.  HENT:     Head: Normocephalic.     Right Ear: Tympanic membrane normal.     Left Ear: Tympanic membrane normal.     Ears:     Comments: Vent tube in left eardrum    Nose: Congestion and rhinorrhea present.     Mouth/Throat:     Mouth: Mucous membranes are moist.     Pharynx: Posterior oropharyngeal erythema (mild) present. No oropharyngeal exudate.  Cardiovascular:     Rate and Rhythm: Normal rate and regular rhythm.     Pulses: Normal pulses.     Heart sounds: Normal heart sounds.  Skin:    General: Skin is warm.  Neurological:     General: No focal deficit present.     Mental Status: She is alert and oriented to person, place, and time.  Psychiatric:        Mood and Affect: Mood normal.        Behavior: Behavior normal.  BP 130/77    Pulse 64    Temp 97.6 F (36.4 C) (Temporal)    Resp  20    Ht 5\' 2"  (1.575 m)    Wt 172 lb (78 kg)    SpO2 98%    BMI 31.46 kg/m   Strep negative       Assessment & Plan:  [name] in today with chief complaint of Headache, Cough, and ears hurt   1. Sore throat - Rapid Strep Screen ([facility] ONLY)  2. URI with cough and congestion 1. Take meds as prescribed 2. Use a cool mist humidifier especially during the winter months and when heat has been humid. 3. Use saline nose sprays frequently 4. Saline irrigations of the nose can be very helpful if done frequently.  * 4X daily for 1 week*  * Use of a nettie pot can be helpful with this. Follow directions with this* 5. Drink plenty of fluids 6. Keep thermostat turn down low 7.For any cough or congestion- delsym 8. For fever or aces or pains- take tylenol or ibuprofen appropriate for age and weight.  * for fevers greater than 101 orally you may alternate ibuprofen and tylenol every  3 hours.       The above assessment and management plan was discussed with the patient. The patient verbalized understanding of and has agreed to  the management plan. Patient is aware to call the clinic if symptoms persist or worsen. Patient is aware when to return to the clinic for a follow-up visit. Patient educated on when it is appropriate to go to the emergency department.   Mary-Margaret Hassell Done, FNP

## 2021-12-07 NOTE — Patient Instructions (Signed)
1. Take meds as prescribed 2. Use a cool mist humidifier especially during the winter months and when heat has been humid. 3. Use saline nose sprays frequently 4. Saline irrigations of the nose can be very helpful if done frequently.  * 4X daily for 1 week*  * Use of a nettie pot can be helpful with this. Follow directions with this* 5. Drink plenty of fluids 6. Keep thermostat turn down low 7.For any cough or congestion- delsym 8. For fever or aces or pains- take tylenol or ibuprofen appropriate for age and weight.  * for fevers greater than 101 orally you may alternate ibuprofen and tylenol every  3 hours.    

## 2021-12-08 NOTE — Progress Notes (Signed)
Received notification from [place] regarding approval for [facility]. Patient assistance approved from 12/07/21 to 11/25/22.  Phone: [phone]

## 2021-12-09 ENCOUNTER — Other Ambulatory Visit: Payer: Self-pay | Admitting: Family Medicine

## 2021-12-09 DIAGNOSIS — J439 Emphysema, unspecified: Secondary | ICD-10-CM

## 2021-12-13 ENCOUNTER — Encounter: Payer: Self-pay | Admitting: Family Medicine

## 2021-12-13 ENCOUNTER — Ambulatory Visit (INDEPENDENT_AMBULATORY_CARE_PROVIDER_SITE_OTHER): Payer: Medicare HMO | Admitting: Family Medicine

## 2021-12-13 VITALS — BP 111/63 | HR 81 | Temp 98.3°F | Ht 62.0 in | Wt 177.0 lb

## 2021-12-13 DIAGNOSIS — J069 Acute upper respiratory infection, unspecified: Secondary | ICD-10-CM | POA: Diagnosis not present

## 2021-12-13 DIAGNOSIS — J029 Acute pharyngitis, unspecified: Secondary | ICD-10-CM | POA: Diagnosis not present

## 2021-12-13 LAB — VERITOR FLU A/B WAIVED
Influenza A: NEGATIVE
Influenza B: NEGATIVE

## 2021-12-13 LAB — RAPID STREP SCREEN (MED CTR MEBANE ONLY): Strep Gp A Ag, IA W/Reflex: NEGATIVE

## 2021-12-13 LAB — CULTURE, GROUP A STREP

## 2021-12-13 MED ORDER — CHERATUSSIN AC 100-10 MG/5ML PO SOLN: 5.0000 mL | ORAL | 0 refills | Status: DC | PRN

## 2021-12-13 MED ORDER — AZITHROMYCIN 250 MG PO TABS: ORAL_TABLET | ORAL | 0 refills | Status: DC

## 2021-12-13 NOTE — Progress Notes (Signed)
No chief complaint on file.   HPI  Patient presents today for Patient presents with upper respiratory congestion. Rhinorrhea that is frequently purulent. There is moderate sore throat. Ears hurt. Patient reports coughing frequently as well.  A little clear sputum noted. Fever to 103 last night with chills.  The patient reports being short of breath. Onset was yesterday afternoon. Gradually worsening. Tried OTCs without improvement.  PMH: Smoking status noted ROS: Per HPI  Objective: BP 111/63    Pulse 81    Temp 98.3 F (36.8 C)    Ht 5\' 2"  (1.575 m)    Wt 177 lb (80.3 kg)    SpO2 98%    BMI 32.37 kg/m  Gen: NAD, alert, cooperative with exam HEENT: NCAT, Nasal passages swollen, red TMS RED CV: RRR, good S1/S2, no murmur Resp: Bronchitis changes with scattered wheezes, non-labored Ext: No edema, warm Neuro: Alert and oriented, No gross deficits Flu A&B, strep test all negative. Covid pending. Assessment and plan:  1. Upper respiratory infection with cough and congestion   2. Sore throat     Meds ordered this encounter  Medications   azithromycin (ZITHROMAX Z-PAK) 250 MG tablet    Sig: Take two right away Then one a day for the next 4 days.    Dispense:  6 each    Refill:  0   guaiFENesin-codeine (CHERATUSSIN AC) 100-10 MG/5ML syrup    Sig: Take 5 mLs by mouth every 4 (four) hours as needed for cough.    Dispense:  180 mL    Refill:  0    Orders Placed This Encounter  Procedures   Novel Coronavirus, NAA ([facility])    Order Specific Question:   Previously tested for COVID-19    Answer:   Yes    Order Specific Question:   Resident in a congregate (group) care setting    Answer:   No    Order Specific Question:   Is the patient student?    Answer:   No    Order Specific Question:   Employed in healthcare setting    Answer:   Unknown    Order Specific Question:   Pregnant    Answer:   No    Order Specific Question:   Has patient completed COVID vaccination(s) (2 doses of  Pfizer/Moderna 1 dose of [employer])    Answer:   Yes    Order Specific Question:   Has patient completed COVID Booster / 3rd dose    Answer:   Yes   Rapid Strep Screen ([facility] ONLY)   Veritor Flu A/B Waived    Order Specific Question:   Source    Answer:   nasopharangeal    Follow up as needed.  [name], MD

## 2021-12-14 LAB — NOVEL CORONAVIRUS, NAA: SARS-CoV-2, NAA: DETECTED — AB

## 2021-12-14 LAB — SARS-COV-2, NAA 2 DAY TAT

## 2021-12-15 ENCOUNTER — Other Ambulatory Visit: Payer: Self-pay | Admitting: Family Medicine

## 2021-12-15 MED ORDER — MOLNUPIRAVIR EUA 200MG CAPSULE: 4.0000 | ORAL_CAPSULE | Freq: Two times a day (BID) | ORAL | 0 refills | Status: AC

## 2021-12-19 ENCOUNTER — Other Ambulatory Visit: Payer: Medicare HMO

## 2021-12-19 DIAGNOSIS — R748 Abnormal levels of other serum enzymes: Secondary | ICD-10-CM

## 2021-12-20 LAB — CMP14+EGFR
ALT: 14 IU/L (ref 0–32)
AST: 19 IU/L (ref 0–40)
Albumin/Globulin Ratio: 1.8 (ref 1.2–2.2)
Albumin: 4.2 g/dL (ref 3.7–4.7)
Alkaline Phosphatase: 129 IU/L — ABNORMAL HIGH (ref 44–121)
BUN/Creatinine Ratio: 14 (ref 12–28)
BUN: 11 mg/dL (ref 8–27)
Bilirubin Total: 0.5 mg/dL (ref 0.0–1.2)
CO2: 24 mmol/L (ref 20–29)
Calcium: 9.3 mg/dL (ref 8.7–10.3)
Chloride: 106 mmol/L (ref 96–106)
Creatinine, Ser: 0.81 mg/dL (ref 0.57–1.00)
Globulin, Total: 2.3 g/dL (ref 1.5–4.5)
Glucose: 163 mg/dL — ABNORMAL HIGH (ref 70–99)
Potassium: 3.8 mmol/L (ref 3.5–5.2)
Sodium: 141 mmol/L (ref 134–144)
Total Protein: 6.5 g/dL (ref 6.0–8.5)
eGFR: 78 mL/min/{1.73_m2} (ref >59)

## 2021-12-20 NOTE — Progress Notes (Signed)
Hello Mariem,  Your lab result is normal and/or stable.Some minor variations that are not significant are commonly marked abnormal, but do not represent any medical problem for you.  Best regards, Dorothy Polhemus, M.D.

## 2021-12-22 ENCOUNTER — Telehealth: Payer: Self-pay | Admitting: Family Medicine

## 2021-12-22 NOTE — Telephone Encounter (Signed)
Have you heard of this? Please advise and send to pools

## 2021-12-24 NOTE — Telephone Encounter (Signed)
She will almost certainly have to get it at a pharmacy

## 2021-12-25 NOTE — Telephone Encounter (Signed)
Left detailed voicemail

## 2021-12-26 DIAGNOSIS — E785 Hyperlipidemia, unspecified: Secondary | ICD-10-CM

## 2021-12-26 DIAGNOSIS — E1122 Type 2 diabetes mellitus with diabetic chronic kidney disease: Secondary | ICD-10-CM | POA: Diagnosis not present

## 2021-12-26 DIAGNOSIS — N1831 Chronic kidney disease, stage 3a: Secondary | ICD-10-CM

## 2022-01-03 ENCOUNTER — Ambulatory Visit (INDEPENDENT_AMBULATORY_CARE_PROVIDER_SITE_OTHER): Payer: Medicare HMO

## 2022-01-03 DIAGNOSIS — G43709 Chronic migraine without aura, not intractable, without status migrainosus: Secondary | ICD-10-CM

## 2022-01-03 DIAGNOSIS — I10 Essential (primary) hypertension: Secondary | ICD-10-CM

## 2022-01-03 DIAGNOSIS — F411 Generalized anxiety disorder: Secondary | ICD-10-CM

## 2022-01-03 DIAGNOSIS — E785 Hyperlipidemia, unspecified: Secondary | ICD-10-CM

## 2022-01-03 DIAGNOSIS — I5032 Chronic diastolic (congestive) heart failure: Secondary | ICD-10-CM

## 2022-01-03 NOTE — Chronic Care Management (AMB) (Signed)
Chronic Care Management    Clinical Social Work Note  01/03/2022 Name: JAQUELYNE FIRKUS MRN: [id number] DOB: 11-16-1951  Yvonne [name] is a 71 y.o. year old female who is a primary care patient of [name], [name], MD. The CCM team was consulted to assist the patient with chronic disease management and/or care coordination needs related to: [employer] .   Engaged with patient by telephone for follow up visit in response to provider referral for social work chronic care management and care coordination services.   Consent to Services:  The patient was given information about Chronic Care Management services, agreed to services, and gave verbal consent prior to initiation of services.  Please see initial visit note for detailed documentation.   Patient agreed to services and consent obtained.   Assessment: Review of patient past medical history, allergies, medications, and health status, including review of relevant consultants reports was performed today as part of a comprehensive evaluation and provision of chronic care management and care coordination services.     SDOH (Social Determinants of Health) assessments and interventions performed:  SDOH Interventions    Flowsheet Row Most Recent Value  SDOH Interventions   Stress Interventions Provide Counseling  [client has stress related to managing medical needs. client has stress related to medical needs of her son]  Depression Interventions/Treatment  Currently on Treatment        Advanced Directives Status: See Vynca application for related entries.  CCM Care Plan  Allergies  Allergen Reactions   Penicillins Hives and Itching    Has patient had a PCN reaction causing immediate rash, facial/tongue/throat swelling, SOB or lightheadedness with hypotension: Yes Has patient had a PCN reaction causing severe rash involving mucus membranes or skin necrosis: Unk Has patient had a PCN reaction that required hospitalization: No Has  patient had a PCN reaction occurring within the last 10 years: No If all of the above answers are "NO", then may proceed with Cephalosporin use.  Has patient had a PCN reaction causing immediate rash, facial/tongue/throat swelling, SOB or lightheadedness with hypotension: Yes Has patient had a PCN reaction causing severe rash involving mucus membranes or skin necrosis: No Has patient had a PCN reaction that required hospitalization No Has patient had a PCN reaction occurring within the last 10 years: No If all of the above answers are "NO", then may proceed with Cephalosporin use. Has patient had a PCN reaction causing immediate rash, facial/tongue/throat swelling, SOB or lightheadedness with hypotension: Yes Has patient had a PCN reaction causing severe rash involving mucus membranes or skin necrosis: Unk Has patient had a PCN reaction that required hospitalization: No Has patient had a PCN reaction occurring within the last 10 years: No If all of the above answers are "NO", then may proceed with Cephalosporin use. Has patient had a PCN reaction causing immediate rash, facial/tongue/throat swelling, SOB or lightheadedness with hypotension: Yes Has patient had a PCN reaction causing severe rash involving mucus membranes or skin necrosis: No Has patient had a PCN reaction that required hospitalization No Has patient had a PCN reaction occurring within the last 10 years: No If all of the above answers are "NO", then may proceed with Cephalosporin use. Has patient had a PCN reaction causing immediate rash, facial/tongue/throat swelling, SOB or lightheadedness with hypotension: Yes Has patient had a PCN reaction causing sev... (TRUNCATED)   Gabapentin Other (See Comments)    Causes a lot of lethargy at a higher dose Causes a lot of lethargy at a  higher dose Causes a lot of lethargy at a higher dose   Lisinopril Cough    HAS A CHRONIC COUGH AND DID NOT NOTICE IMPROVEMENT OF COUGH WHEN LISINOPRIL  WAS STOPPED HAS A CHRONIC COUGH AND DID NOT NOTICE IMPROVEMENT OF COUGH WHEN LISINOPRIL WAS STOPPED HAS A CHRONIC COUGH AND DID NOT NOTICE IMPROVEMENT OF COUGH WHEN LISINOPRIL WAS STOPPED   Soap Rash    DIAL soap    Outpatient Encounter Medications as of 01/03/2022  Medication Sig   Accu-Chek Softclix Lancets lancets Test BS up to 4 times daily Dx E11.69   albuterol (VENTOLIN HFA) 108 (90 Base) MCG/ACT inhaler TAKE 2 PUFFS BY MOUTH EVERY 6 HOURS AS NEEDED FOR WHEEZE OR SHORTNESS OF BREATH   alendronate (FOSAMAX) 70 MG tablet Take 1 tablet (70 mg total) by mouth every 7 (seven) days. Take with a full glass of water on an empty stomach.   ALPRAZolam (XANAX) 1 MG tablet ! Each morning, one each evening, and 1/2 each afternoon   amLODipine (NORVASC) 10 MG tablet TAKE 1 TABLET BY MOUTH EVERY DAY   atorvastatin (LIPITOR) 80 MG tablet Take 1 tablet (80 mg total) by mouth daily.   azithromycin (ZITHROMAX Z-PAK) 250 MG tablet Take two right away Then one a day for the next 4 days.   Blood Glucose Monitoring Suppl (ACCU-CHEK GUIDE) w/Device KIT Test Bs QID Dx E11.69   clopidogrel (PLAVIX) 75 MG tablet TAKE 1 TABLET BY MOUTH EVERY DAY WITH BREAKFAST   dapagliflozin propanediol (FARXIGA) 10 MG TABS tablet Take 10 mg by mouth daily.   ezetimibe (ZETIA) 10 MG tablet TAKE 1 TABLET BY MOUTH DAILY. FOR CHOLESTEROL   famotidine (PEPCID) 20 MG tablet famotidine 20 mg tablet  TAKE 1 TABLET BY MOUTH AT BEDTIME   fluticasone furoate-vilanterol (BREO ELLIPTA) 100-25 MCG/INH AEPB Inhale 1 puff into the lungs daily.   furosemide (LASIX) 20 MG tablet Take 1 tablet (20 mg total) by mouth daily.   glipiZIDE (GLUCOTROL) 5 MG tablet Take 1 tablet (5 mg total) by mouth daily before breakfast.   glucose blood (ACCU-CHEK GUIDE) test strip TEST BLOOD SUGAR 4 TIMES DAILY Dx E11.69   guaiFENesin-codeine (CHERATUSSIN AC) 100-10 MG/5ML syrup Take 5 mLs by mouth every 4 (four) hours as needed for cough.   loratadine (CLARITIN) 10  MG tablet Take 1 tablet (10 mg total) by mouth daily.   meloxicam (MOBIC) 7.5 MG tablet Take 1 tablet by mouth 2 (two) times daily as needed.   nitroGLYCERIN (NITROSTAT) 0.4 MG SL tablet Place 1 tablet (0.4 mg total) under the tongue every 5 (five) minutes as needed for chest pain.   ofloxacin (FLOXIN) 0.3 % OTIC solution ofloxacin 0.3 % ear drops  INSTILL 5 DROPS IN EACH EAR TWICE A DAY AS NEEDED FOR EAR PAIN   Semaglutide, 2 MG/DOSE, (OZEMPIC, 2 MG/DOSE,) 8 MG/3ML SOPN Inject 2 mg into the skin once a week.   solifenacin (VESICARE) 10 MG tablet Take 10 mg by mouth daily.   No facility-administered encounter medications on file as of 01/03/2022.    Patient Active Problem List   Diagnosis Date Noted   Ear pain, right 06/13/2021   Restless legs 02/27/2021   Insomnia 02/27/2021   Polyneuropathy due to type 2 diabetes mellitus ([place]) 02/27/2021   Palpitations 01/20/2021   Aortic atherosclerosis ([place]) 01/20/2021   Orthostatic hypotension 01/20/2021   Carpal tunnel syndrome 08/23/2020   Diabetic lipidosis ([place]) 09/09/2019   Hypokalemia 12/11/2018   Benzodiazepine dependence, continuous ([place]) 10/22/2018  Chronic migraine without aura without status migrainosus, not intractable 02/19/2018   Chronic diastolic CHF (congestive heart failure) ([place]) 03/21/2017   Chronic bilateral low back pain with bilateral sciatica 02/27/2017   Spondylosis of lumbar region without myelopathy or radiculopathy 08/15/2016   Pain of both hip joints 06/18/2016   Pain syndrome, chronic 06/18/2016   Chronic cough 05/17/2016   Hyperlipidemia with target LDL less than 70 11/04/2015   Macular hole of right eye 09/14/2015   Obesity 07/20/2015   Osteopenia 07/20/2015   Precordial pain 07/06/2015   Overactive bladder    Hypertension    GAD (generalized anxiety disorder)    Acid reflux disease 08/17/2014    Conditions to be addressed/monitored: monitor client management of anxiety and depression issues.   Care Plan  : [name] Care Plan  Updates made by Katha Cabal, [name] since 01/03/2022 12:00 AM     Problem: Emotional Distress      Goal: Needs help with coping skills to manage anxiety, stress, and depression symptoms faced   Start Date: 01/03/2022  Expected End Date: 04/02/2022  This Visit's Progress: Not on track  Recent Progress: On track  Priority: High  Note:   Current Barriers:  Eating and swallowing is difficult because of inflammation in her throat Pain issues Suicidal Ideation/Homicidal Ideation: No Mental Health Challenges Anxiety issues  Clinical Social Work Goal(s):  patient will work with SW  by telephone or in person in next 30 days to discuss strategies for managing stress or anxiety or depression symptoms faced.  Patient to talk with [name] or [name] as needed for CCM support in next 30 days Patient will take time for self care, relaxation and rest as needed in next 30 days  Interventions: 1:1 collaboration with Dr. [name] regarding development and update of comprehensive plan of care as evidenced by  provider attestation and co-signature Discussed mood of client. She said she is concerned over health needs of her son. Son in in hospital in [place], [place]. Son has been in that hospital for about 2 weeks. Shardea is concerned about health needs of her son. She has prescribed Xanax Reviewed client appetite. She has reduced appetite. Reviewed transport needs. She said her spouse helps her with transport needs. Discussed sleeping challenges. She said she has difficulty sleeping Provided counseling support for client Discussed medication procurement. She said she has one shot left of Ozempic. She said she has been taking two shots of Ozempic per week.  She said she has been getting Ozempic [facility]. Masiya said she brought a denial letter to [facility] yesterday related to [place].  [name] collaborated today with Dr. [name] about Ozempic issue of client Discussed pain issues of  client Client said she has no nursing issues at present. She is stressed related to health needs of her son; she is stressed related to trying to obtain her prescribed medications  Patient Self Care Activities:  Self administers medications as prescribed Attends all scheduled provider appointments Performs ADL's independently  Patient Coping Strengths:  Supportive Relationships Family Self Advocate  Patient Self Care Deficits:  Pain issues Challenges with Diabetes management  Patient Goals:  - exercise at least 2 to 3 times per week - spend time or talk with others every day - practice relaxation or meditation daily - keep a calendar with appointment dates  Follow Up Plan: [name] to call client on 02/27/22 at 3:00 PM to assess client needs     [name].Iam Lipson MSW, [name] Licensed Clinical Social  Worker [facility] Care Management [phone]

## 2022-01-03 NOTE — Patient Instructions (Addendum)
Visit Information  Patient Goals: Needs coping skills to manage anxiety, stress, or depression symptoms faced  Timeframe: Short Term Goal  This Visit's Progress: Not On Track  Priority : High    Start Date: 01/03/22  Expected End Date  04/02/22  Completed Date  Follow Up Date:  02/27/22 at 3:00 PM     Needs help with coping skills to manage anxiety, stress or depression symptoms faced :   Patient Self Care Activities:  Self administers medications as prescribed Attends all scheduled provider appointments Performs ADL's independently  Patient Coping Strengths:  Supportive Relationships Family Self Advocate  Patient Self Care Deficits:  Pain issues Challenges with Diabetes management  Patient Goals:  - exercise at least 2 to 3 times per week - spend time or talk with others every day - practice relaxation or meditation daily - keep a calendar with appointment dates  Follow Up Plan: [name] to call client on 02/27/22 at 3:00 PM to assess client needs   [name].Tameca Jerez MSW, [place] [employer] representative [facility] Care Management [phone]

## 2022-01-04 ENCOUNTER — Telehealth: Payer: Self-pay | Admitting: Family Medicine

## 2022-01-04 ENCOUNTER — Telehealth: Payer: Medicare HMO

## 2022-01-05 NOTE — Telephone Encounter (Signed)
Patient aware.

## 2022-01-05 NOTE — Telephone Encounter (Signed)
I faxed the forms she dropped off to the [employer] patient assistance program.  I left her copies up front in an envelop for her to pick up. Let her know approval and shipping times are about 6 weeks out due to back orders and shortages.  We do not currently have samples.  She can likely fill at drug store for $47 for 1 month if needed.  Thanks!

## 2022-01-10 ENCOUNTER — Ambulatory Visit: Payer: Medicare HMO | Admitting: Pharmacist

## 2022-01-10 DIAGNOSIS — E1122 Type 2 diabetes mellitus with diabetic chronic kidney disease: Secondary | ICD-10-CM

## 2022-01-10 DIAGNOSIS — I10 Essential (primary) hypertension: Secondary | ICD-10-CM

## 2022-01-11 MED ORDER — DAPAGLIFLOZIN PROPANEDIOL 10 MG PO TABS: 10.0000 mg | ORAL_TABLET | Freq: Every day | ORAL | 4 refills | Status: DC

## 2022-01-11 NOTE — Patient Instructions (Signed)
Visit Information  Following are the goals we discussed today:  Current Barriers:  Unable to independently afford treatment regimen Unable to maintain control of T2DM, HLD Suboptimal therapeutic regimen for T2DM, HLD  Pharmacist Clinical Goal(s):  patient will verbalize ability to afford treatment regimen maintain control of T2DM, HLD as evidenced by GOAL A1C<7%, LDL<70  adhere to plan to optimize therapeutic regimen for T2DM, HLD as evidenced by report of adherence to recommended medication management changes through collaboration with [name] and provider.    Interventions: 1:1 collaboration with [name], MD regarding development and update of comprehensive plan of care as evidenced by provider attestation and co-signature Inter-disciplinary care team collaboration (see longitudinal plan of care) Comprehensive medication review performed; medication list updated in electronic medical record  Diabetes: Goal on Track (progressing): YES. Uncontrolled-7.8%; current treatment:OZEMPIC 2MG , FARXIGA 10MG ;, GLIPIZIDE  PATIENT HAS BEEN UNABLE TO GET THESE MEDICATIONS DUE TO COST WILL APPLY FOR PATIENT ASSISTANCE  Patient received farxiga in the mail Awaiting ozempic shipment--increasing dose to 2mg  weekly (backordered); sample given today Denies personal and family history of Medullary thyroid cancer (MTC) GFR 44-->78 WORK TO D/C GLIPIZIDE Current glucose readings: fasting glucose: <180, post prandial glucose: 200s Denies hypoglycemic/hyperglycemic symptoms Discussed meal planning options and Plate method for healthy eating Avoid sugary drinks and desserts Incorporate balanced protein, non starchy veggies, 1 serving of carbohydrate with each meal Increase water intake Increase physical activity as able Current exercise: N/A; ENCOURAGED Recommended RESTART MEDICATIONS; WILL WORK UP TO OZEMPIC 2MG  DOSE Assessed patient finances. Patient approved for [facility]OV[facility] [place]  Hyperlipidemia:  New goal. controlled; current treatment:ZETIA, ATORVASTATIN 80MG ;  Medications previously tried: N/A Current dietary patterns: RECOMMENDED HEART HEALTHY  Lipid Panel     Component Value Date/Time   CHOL 147 11/30/2021 1450   TRIG 197 (H) 11/30/2021 1450   HDL 49 11/30/2021 1450   CHOLHDL 3.0 11/30/2021 1450   CHOLHDL 5.7 06/11/2019 0622   VLDL 39 06/11/2019 0622   LDLCALC 66 11/30/2021 1450   LABVLDL 32 11/30/2021 1450     Patient Goals/Self-Care Activities patient will:  - take medications as prescribed as evidenced by patient report and record review check glucose DAILY, document, and provide at future appointments collaborate with provider on medication access solutions target a minimum of 150 minutes of moderate intensity exercise weekly engage in dietary modifications by HEALTHY PLATE METHOD, HEART HEALTHY   Plan: Telephone follow up appointment with care management team member scheduled for:  4 months  Signature Regina Eck, [name], BCPS Clinical Pharmacist, [place]  [facility] Phone [phone]   Please call the care guide team at [phone] if you need to cancel or reschedule your appointment.   Patient verbalizes understanding of instructions and care plan provided today and agrees to view in [place]. Active MyChart status confirmed with patient.

## 2022-01-11 NOTE — Progress Notes (Signed)
Chronic Care Management Pharmacy Note  01/10/2022 Name:  Yvonne [name] MRN:  [id number] DOB:  [birth year]  Summary: T2DM  Recommendations/Changes made from today's visit:  Diabetes: Goal on Track (progressing): YES. Uncontrolled-7.8%; current treatment:OZEMPIC 2MG, FARXIGA 10MG;, GLIPIZIDE  PATIENT HAS BEEN UNABLE TO GET THESE MEDICATIONS DUE TO COST WILL APPLY FOR PATIENT ASSISTANCE  Patient received farxiga in the mail Awaiting ozempic shipment--increasing dose to 65m weekly (backordered); sample given today Denies personal and family history of Medullary thyroid cancer (MTC) GFR 44-->78 WORK TO D/C GLIPIZIDE Current glucose readings: fasting glucose: <180, post prandial glucose: 200s Denies hypoglycemic/hyperglycemic symptoms Discussed meal planning options and Plate method for healthy eating Avoid sugary drinks and desserts Incorporate balanced protein, non starchy veggies, 1 serving of carbohydrate with each meal Increase water intake Increase physical activity as able Current exercise: N/A; ENCOURAGED Recommended RESTART MEDICATIONS; WILL WORK UP TO OZEMPIC 2MG DOSE Assessed patient finances. [place]   Subjective: Yvonne FURBERis an 71y.o. year old female who is a primary patient of [name], WCletus Gash MD.  The CCM team was consulted for assistance with disease management and care coordination needs.    Engaged with patient by telephone for follow up visit in response to provider referral for pharmacy case management and/or care coordination services.   Consent to Services:  The patient was given information about Chronic Care Management services, agreed to services, and gave verbal consent prior to initiation of services.  Please see initial visit note for detailed documentation.   Patient Care Team: SClaretta Fraise MD as PCP - General (Family Medicine) SVaughan Basta TRona Ravens NP (Inactive) as Nurse  Practitioner (Internal Medicine) JPieter Partridge DO as Consulting Physician (Neurology) KCleon Gustin MD (Anesthesiology) FKatha Cabal [name] as Social Worker (Licensed Clinical Social Worker) HIlean China RN as Case Manager PLavera Guise [facility](Pharmacist) CWerner Lean MD as Consulting Physician (Cardiology)   Objective:  Lab Results  Component Value Date   CREATININE 0.81 12/19/2021   CREATININE 1.31 (H) 11/30/2021   CREATININE 1.05 (H) 08/07/2021    Lab Results  Component Value Date   HGBA1C 7.8 (H) 11/30/2021   Last diabetic Eye exam: No results found for: HMDIABEYEEXA  Last diabetic Foot exam: No results found for: HMDIABFOOTEX      Component Value Date/Time   CHOL 147 11/30/2021 1450   TRIG 197 (H) 11/30/2021 1450   HDL 49 11/30/2021 1450   CHOLHDL 3.0 11/30/2021 1450   CHOLHDL 5.7 06/11/2019 0622   VLDL 39 06/11/2019 0622   LDLCALC 66 11/30/2021 1450    Hepatic Function Latest Ref Rng & Units 12/19/2021 11/30/2021 08/07/2021  Total Protein 6.0 - 8.5 g/dL 6.5 6.2 6.2  Albumin 3.7 - 4.7 g/dL 4.2 4.4 4.0  AST 0 - 40 IU/L 19 72(H) 17  ALT 0 - 32 IU/L 14 43(H) 31  Alk Phosphatase 44 - 121 IU/L 129(H) 162(H) 162(H)  Total Bilirubin 0.0 - 1.2 mg/dL 0.5 0.3 0.3    Lab Results  Component Value Date/Time   TSH 3.040 06/20/2018 10:11 AM   TSH 1.580 06/03/2015 12:10 PM    CBC Latest Ref Rng & Units 11/30/2021 08/07/2021 05/04/2021  WBC 3.4 - 10.8 x10E3/uL 6.2 5.4 7.0  Hemoglobin 11.1 - 15.9 g/dL 13.8 13.3 13.8  Hematocrit 34.0 - 46.6 % 41.1 39.0 40.3  Platelets 150 - 450 x10E3/uL 288 240 245    Lab Results  Component Value Date/Time   VD25OH 14.9 (L) 05/03/2016 08:07 AM   VD25OH 20.0 (L) 06/03/2015 12:10 PM    Clinical ASCVD: No  The 10-year ASCVD risk score (Arnett DK, et al., 2019) is: 20.7%   Values used to calculate the score:     Age: 71 years     Sex: Female     Is Non-Hispanic African American: No     Diabetic: Yes     Tobacco  smoker: No     Systolic Blood Pressure: 408 mmHg     Is BP treated: Yes     HDL Cholesterol: 49 mg/dL     Total Cholesterol: 147 mg/dL    Other: (CHADS2VASc if Afib, PHQ9 if depression, MMRC or CAT for COPD, ACT, DEXA)  Social History   Tobacco Use  Smoking Status Never  Smokeless Tobacco Never   BP Readings from Last 3 Encounters:  12/13/21 111/63  12/07/21 130/77  11/30/21 118/71   Pulse Readings from Last 3 Encounters:  12/13/21 81  12/07/21 64  11/30/21 80   Wt Readings from Last 3 Encounters:  12/13/21 177 lb (80.3 kg)  12/07/21 172 lb (78 kg)  11/30/21 179 lb 6 oz (81.4 kg)    Assessment: Review of patient past medical history, allergies, medications, health status, including review of consultants reports, laboratory and other test data, was performed as part of comprehensive evaluation and provision of chronic care management services.   SDOH:  (Social Determinants of Health) assessments and interventions performed:    CCM Care Plan  Allergies  Allergen Reactions   Penicillins Hives and Itching    Has patient had a PCN reaction causing immediate rash, facial/tongue/throat swelling, SOB or lightheadedness with hypotension: Yes Has patient had a PCN reaction causing severe rash involving mucus membranes or skin necrosis: Unk Has patient had a PCN reaction that required hospitalization: No Has patient had a PCN reaction occurring within the last 10 years: No If all of the above answers are "NO", then may proceed with Cephalosporin use.  Has patient had a PCN reaction causing immediate rash, facial/tongue/throat swelling, SOB or lightheadedness with hypotension: Yes Has patient had a PCN reaction causing severe rash involving mucus membranes or skin necrosis: No Has patient had a PCN reaction that required hospitalization No Has patient had a PCN reaction occurring within the last 10 years: No If all of the above answers are "NO", then may proceed with  Cephalosporin use. Has patient had a PCN reaction causing immediate rash, facial/tongue/throat swelling, SOB or lightheadedness with hypotension: Yes Has patient had a PCN reaction causing severe rash involving mucus membranes or skin necrosis: Unk Has patient had a PCN reaction that required hospitalization: No Has patient had a PCN reaction occurring within the last 10 years: No If all of the above answers are "NO", then may proceed with Cephalosporin use. Has patient had a PCN reaction causing immediate rash, facial/tongue/throat swelling, SOB or lightheadedness with hypotension: Yes Has patient had a PCN reaction causing severe rash involving mucus membranes or skin necrosis: No Has patient had a PCN reaction that required hospitalization No Has patient had a PCN reaction occurring within the last 10 years: No If all of the above answers are "NO", then may proceed with Cephalosporin use. Has patient had a PCN reaction causing immediate rash, facial/tongue/throat swelling, SOB or lightheadedness with hypotension: Yes Has patient had a PCN reaction causing sev... (TRUNCATED)   Gabapentin Other (See Comments)    Causes a lot of  lethargy at a higher dose Causes a lot of lethargy at a higher dose Causes a lot of lethargy at a higher dose   Lisinopril Cough    HAS A CHRONIC COUGH AND DID NOT NOTICE IMPROVEMENT OF COUGH WHEN LISINOPRIL WAS STOPPED HAS A CHRONIC COUGH AND DID NOT NOTICE IMPROVEMENT OF COUGH WHEN LISINOPRIL WAS STOPPED HAS A CHRONIC COUGH AND DID NOT NOTICE IMPROVEMENT OF COUGH WHEN LISINOPRIL WAS STOPPED   Soap Rash    DIAL soap    Medications Reviewed Today     Reviewed by Lavera Guise, [facility] (Pharmacist) on 01/11/22 at 1129  Med List Status: <None>   Medication Order Taking? Sig Documenting Provider Last Dose Status Informant  Accu-Chek Softclix Lancets lancets [id number] No Test BS up to 4 times daily Dx E11.69 [name], MD Taking Active   albuterol (VENTOLIN HFA)  108 (90 Base) MCG/ACT inhaler [id number] No TAKE 2 PUFFS BY MOUTH EVERY 6 HOURS AS NEEDED FOR WHEEZE OR SHORTNESS OF Belenda Cruise, MD Taking Active   alendronate (FOSAMAX) 70 MG tablet [id number] No Take 1 tablet (70 mg total) by mouth every 7 (seven) days. Take with a full glass of water on an empty stomach. [name], MD Taking Active   ALPRAZolam Duanne Moron) 1 MG tablet [id number] No ! Each morning, one each evening, and 1/2 each afternoon [name], MD Taking Active   amLODipine (NORVASC) 10 MG tablet [id number] No TAKE 1 TABLET BY MOUTH EVERY DAY [name], MD Taking Active   atorvastatin (LIPITOR) 80 MG tablet [id number] No Take 1 tablet (80 mg total) by mouth daily. Werner Lean, MD Taking Active     Discontinued 01/11/22 1128 (Completed Course)   Blood Glucose Monitoring Suppl (ACCU-CHEK GUIDE) w/Device KIT [id number] No Test Bs QID Dx E11.69 [name], MD Taking Active   clopidogrel (PLAVIX) 75 MG tablet [id number] No TAKE 1 TABLET BY MOUTH EVERY DAY WITH Gara Kroner, MD Taking Active   dapagliflozin propanediol (FARXIGA) 10 MG TABS tablet [id number]  Take 1 tablet (10 mg total) by mouth daily. [name], MD  Active            Med Note Parthenia Ames Jan 11, 2022 11:28 AM) Via [place] patient assistance [place]   ezetimibe (ZETIA) 10 MG tablet [id number] No TAKE 1 TABLET BY MOUTH DAILY. FOR CHOLESTEROL [name], MD Taking Active   famotidine (PEPCID) 20 MG tablet [id number] No famotidine 20 mg tablet  TAKE 1 TABLET BY MOUTH AT BEDTIME [provider] Taking Active   fluticasone furoate-vilanterol (BREO ELLIPTA) 100-25 MCG/INH AEPB [id number] No Inhale 1 puff into the lungs daily. [name], MD Taking Active   furosemide (LASIX) 20 MG tablet [id number] No Take 1 tablet (20 mg total) by mouth daily. [name], MD Taking Active   glipiZIDE (GLUCOTROL) 5 MG tablet [id number] No Take 1 tablet (5 mg total) by mouth daily  before breakfast. [name], MD Taking Active   glucose blood (ACCU-CHEK GUIDE) test strip [id number] No TEST BLOOD SUGAR 4 TIMES DAILY Dx E11.69 [name], MD Taking Active   guaiFENesin-codeine [facility]) 100-10 MG/5ML syrup [id number]  Take 5 mLs by mouth every 4 (four) hours as needed for cough. [name], MD  Active   loratadine (CLARITIN) 10 MG tablet [id number] No Take 1 tablet (10 mg total) by mouth daily. [name], MD Taking Active Self  meloxicam (MOBIC) 7.5 MG tablet [id number] No Take 1 tablet by mouth 2 (  two) times daily as needed. [provider] Taking Active   nitroGLYCERIN (NITROSTAT) 0.4 MG SL tablet [id number] No Place 1 tablet (0.4 mg total) under the tongue every 5 (five) minutes as needed for chest pain. Werner Lean, MD Taking Active   ofloxacin (FLOXIN) 0.3 % OTIC solution [id number] No ofloxacin 0.3 % ear drops  INSTILL 5 DROPS IN EACH EAR TWICE A DAY AS NEEDED FOR EAR PAIN [provider] Taking Active   Semaglutide, 2 MG/DOSE, (OZEMPIC, 2 MG/DOSE,) 8 MG/3ML SOPN [id number] No Inject 2 mg into the skin once a week. [name], MD Taking Active            Med Note Parthenia Ames Jan 11, 2022 11:24 AM) Awaiting novo [employer]isk patient assistance program  solifenacin (VESICARE) 10 MG tablet [id number] No Take 10 mg by mouth daily. [provider] Taking Active             Patient Active Problem List   Diagnosis Date Noted   Ear pain, right 06/13/2021   Restless legs 02/27/2021   Insomnia 02/27/2021   Polyneuropathy due to type 2 diabetes mellitus ([place]) 02/27/2021   Palpitations 01/20/2021   Aortic atherosclerosis ([place]) 01/20/2021   Orthostatic hypotension 01/20/2021   Carpal tunnel syndrome 08/23/2020   Diabetic lipidosis ([place]) 09/09/2019   Hypokalemia 12/11/2018   Benzodiazepine dependence, continuous ([place]) 10/22/2018   Chronic migraine without aura without status migrainosus, not intractable  02/19/2018   Chronic diastolic CHF (congestive heart failure) ([place]) 03/21/2017   Chronic bilateral low back pain with bilateral sciatica 02/27/2017   Spondylosis of lumbar region without myelopathy or radiculopathy 08/15/2016   Pain of both hip joints 06/18/2016   Pain syndrome, chronic 06/18/2016   Chronic cough 05/17/2016   Hyperlipidemia with target LDL less than 70 11/04/2015   Macular hole of right eye 09/14/2015   Obesity 07/20/2015   Osteopenia 07/20/2015   Precordial pain 07/06/2015   Overactive bladder    Hypertension    GAD (generalized anxiety disorder)    Acid reflux disease 08/17/2014    Immunization History  Administered Date(s) Administered   Fluad Quad(high Dose 65+) 08/30/2021   Influenza Split 08/18/2015   Influenza,inj,Quad PF,6+ Mos 10/22/2018   Influenza-Unspecified 09/14/2013   Moderna Sars-Covid-2 Vaccination 06/14/2020, 07/12/2020   Pneumococcal Conjugate-13 01/06/2015   Pneumococcal Polysaccharide-23 10/22/2018   Tdap 06/06/2017, 05/22/2021   Zoster Recombinat (Shingrix) 06/06/2017, 02/26/2018    Conditions to be addressed/monitored: DMII  Care Plan : [name] MEDICATION MANAGEMENT  Updates made by Lavera Guise, RPH since 01/11/2022 12:00 AM     Problem: DISEASE PROGRESSION PREVENTION      Long-Range Goal: T2DM, HLD [name] GOAL   Recent Progress: Not on track  Note:   Current Barriers:  Unable to independently afford treatment regimen Unable to maintain control of T2DM, HLD Suboptimal therapeutic regimen for T2DM, HLD  Pharmacist Clinical Goal(s):  patient will verbalize ability to afford treatment regimen maintain control of T2DM, HLD as evidenced by GOAL A1C<7%, LDL<70  adhere to plan to optimize therapeutic regimen for T2DM, HLD as evidenced by report of adherence to recommended medication management changes through collaboration with [name] and provider.    Interventions: 1:1 collaboration with [name], MD regarding  development and update of comprehensive plan of care as evidenced by provider attestation and co-signature Inter-disciplinary care team collaboration (see longitudinal plan of care) Comprehensive medication review performed; medication list updated in electronic medical record  Diabetes: Goal on Track (  progressing): YES. Uncontrolled-7.8%; current treatment:OZEMPIC 2MG, FARXIGA 10MG;, GLIPIZIDE  PATIENT HAS BEEN UNABLE TO GET THESE MEDICATIONS DUE TO COST WILL APPLY FOR PATIENT ASSISTANCE  Patient received farxiga in the mail Awaiting ozempic shipment--increasing dose to 72m weekly (backordered); sample given today Denies personal and family history of Medullary thyroid cancer (MTC) GFR 44-->78 WORK TO D/C GLIPIZIDE Current glucose readings: fasting glucose: <180, post prandial glucose: 200s Denies hypoglycemic/hyperglycemic symptoms Discussed meal planning options and Plate method for healthy eating Avoid sugary drinks and desserts Incorporate balanced protein, non starchy veggies, 1 serving of carbohydrate with each meal Increase water intake Increase physical activity as able Current exercise: N/A; ENCOURAGED Recommended RESTART MEDICATIONS; WILL WORK UP TO OZEMPIC 2MG DOSE Assessed patient finances. Patient [place] for [place]  Hyperlipidemia:  New goal. controlled; current treatment:ZETIA, ATORVASTATIN 80MG;  Medications previously tried: N/A Current dietary patterns: RECOMMENDED HEART HEALTHY  Lipid Panel     Component Value Date/Time   CHOL 147 11/30/2021 1450   TRIG 197 (H) 11/30/2021 1450   HDL 49 11/30/2021 1450   CHOLHDL 3.0 11/30/2021 1450   CHOLHDL 5.7 06/11/2019 0622   VLDL 39 06/11/2019 0622   LDLCALC 66 11/30/2021 1450   LABVLDL 32 11/30/2021 1450    Patient Goals/Self-Care Activities patient will:  - take medications as prescribed as evidenced by patient report and record review check glucose DAILY,  document, and provide at future appointments collaborate with provider on medication access solutions target a minimum of 150 minutes of moderate intensity exercise weekly engage in dietary modifications by HEALTHY PLATE METHOD, HEART HEALTHY      Medication Assistance:  [place] Patient's preferred pharmacy is:  [id number] [place], [id number]: [phone]: [address]one: [phone]: [phone] Follow Up:  Patient agrees to Care Plan and Follow-up.  Plan: Telephone follow up appointment with care management team member scheduled for:  4 MONTHS   JuRegina EckPharmD, BCPS Clinical Pharmacist, [place] Phone [phone]

## 2022-01-16 ENCOUNTER — Observation Stay (HOSPITAL_COMMUNITY): Payer: Medicare HMO

## 2022-01-16 ENCOUNTER — Observation Stay (HOSPITAL_COMMUNITY)
Admission: EM | Admit: 2022-01-16 | Discharge: 2022-01-18 | Disposition: A | Discharge status: Home / Self Care | Payer: Medicare HMO | Attending: Internal Medicine | Admitting: Internal Medicine

## 2022-01-16 ENCOUNTER — Emergency Department (HOSPITAL_COMMUNITY): Payer: Medicare HMO

## 2022-01-16 ENCOUNTER — Encounter (HOSPITAL_COMMUNITY): Payer: Self-pay

## 2022-01-16 ENCOUNTER — Encounter: Payer: Self-pay | Admitting: Nurse Practitioner

## 2022-01-16 ENCOUNTER — Other Ambulatory Visit: Payer: Self-pay

## 2022-01-16 ENCOUNTER — Ambulatory Visit (INDEPENDENT_AMBULATORY_CARE_PROVIDER_SITE_OTHER): Payer: Medicare HMO | Admitting: Nurse Practitioner

## 2022-01-16 DIAGNOSIS — K219 Gastro-esophageal reflux disease without esophagitis: Secondary | ICD-10-CM | POA: Diagnosis not present

## 2022-01-16 DIAGNOSIS — F411 Generalized anxiety disorder: Secondary | ICD-10-CM | POA: Diagnosis not present

## 2022-01-16 DIAGNOSIS — R531 Weakness: Secondary | ICD-10-CM | POA: Diagnosis not present

## 2022-01-16 DIAGNOSIS — R051 Acute cough: Secondary | ICD-10-CM | POA: Diagnosis not present

## 2022-01-16 DIAGNOSIS — Z79899 Other long term (current) drug therapy: Secondary | ICD-10-CM | POA: Diagnosis not present

## 2022-01-16 DIAGNOSIS — R55 Syncope and collapse: Secondary | ICD-10-CM | POA: Diagnosis not present

## 2022-01-16 DIAGNOSIS — J029 Acute pharyngitis, unspecified: Secondary | ICD-10-CM | POA: Diagnosis not present

## 2022-01-16 DIAGNOSIS — Z7901 Long term (current) use of anticoagulants: Secondary | ICD-10-CM | POA: Insufficient documentation

## 2022-01-16 DIAGNOSIS — N1831 Chronic kidney disease, stage 3a: Secondary | ICD-10-CM

## 2022-01-16 DIAGNOSIS — R0600 Dyspnea, unspecified: Secondary | ICD-10-CM

## 2022-01-16 DIAGNOSIS — R42 Dizziness and giddiness: Secondary | ICD-10-CM | POA: Diagnosis not present

## 2022-01-16 DIAGNOSIS — I639 Cerebral infarction, unspecified: Secondary | ICD-10-CM | POA: Diagnosis not present

## 2022-01-16 DIAGNOSIS — E7800 Pure hypercholesterolemia, unspecified: Secondary | ICD-10-CM | POA: Diagnosis not present

## 2022-01-16 DIAGNOSIS — Z7984 Long term (current) use of oral hypoglycemic drugs: Secondary | ICD-10-CM | POA: Insufficient documentation

## 2022-01-16 DIAGNOSIS — E119 Type 2 diabetes mellitus without complications: Secondary | ICD-10-CM

## 2022-01-16 DIAGNOSIS — R739 Hyperglycemia, unspecified: Secondary | ICD-10-CM | POA: Diagnosis not present

## 2022-01-16 DIAGNOSIS — J45909 Unspecified asthma, uncomplicated: Secondary | ICD-10-CM | POA: Insufficient documentation

## 2022-01-16 DIAGNOSIS — Z20822 Contact with and (suspected) exposure to covid-19: Secondary | ICD-10-CM | POA: Insufficient documentation

## 2022-01-16 DIAGNOSIS — R202 Paresthesia of skin: Secondary | ICD-10-CM | POA: Diagnosis not present

## 2022-01-16 DIAGNOSIS — J441 Chronic obstructive pulmonary disease with (acute) exacerbation: Secondary | ICD-10-CM | POA: Diagnosis not present

## 2022-01-16 DIAGNOSIS — E11649 Type 2 diabetes mellitus with hypoglycemia without coma: Secondary | ICD-10-CM

## 2022-01-16 DIAGNOSIS — E785 Hyperlipidemia, unspecified: Secondary | ICD-10-CM | POA: Diagnosis present

## 2022-01-16 DIAGNOSIS — R000 Tachycardia, unspecified: Secondary | ICD-10-CM | POA: Diagnosis not present

## 2022-01-16 DIAGNOSIS — I63312 Cerebral infarction due to thrombosis of left middle cerebral artery: Secondary | ICD-10-CM | POA: Diagnosis not present

## 2022-01-16 DIAGNOSIS — Z043 Encounter for examination and observation following other accident: Secondary | ICD-10-CM | POA: Diagnosis not present

## 2022-01-16 DIAGNOSIS — R2990 Unspecified symptoms and signs involving the nervous system: Secondary | ICD-10-CM

## 2022-01-16 DIAGNOSIS — G459 Transient cerebral ischemic attack, unspecified: Secondary | ICD-10-CM | POA: Diagnosis not present

## 2022-01-16 DIAGNOSIS — E1122 Type 2 diabetes mellitus with diabetic chronic kidney disease: Secondary | ICD-10-CM

## 2022-01-16 DIAGNOSIS — R0602 Shortness of breath: Secondary | ICD-10-CM | POA: Diagnosis not present

## 2022-01-16 DIAGNOSIS — R4781 Slurred speech: Secondary | ICD-10-CM | POA: Diagnosis not present

## 2022-01-16 DIAGNOSIS — I10 Essential (primary) hypertension: Secondary | ICD-10-CM | POA: Diagnosis present

## 2022-01-16 DIAGNOSIS — I6381 Other cerebral infarction due to occlusion or stenosis of small artery: Secondary | ICD-10-CM | POA: Diagnosis not present

## 2022-01-16 DIAGNOSIS — Z91199 Patient's noncompliance with other medical treatment and regimen due to unspecified reason: Secondary | ICD-10-CM

## 2022-01-16 DIAGNOSIS — G4489 Other headache syndrome: Secondary | ICD-10-CM | POA: Diagnosis not present

## 2022-01-16 DIAGNOSIS — R209 Unspecified disturbances of skin sensation: Secondary | ICD-10-CM

## 2022-01-16 DIAGNOSIS — E1165 Type 2 diabetes mellitus with hyperglycemia: Secondary | ICD-10-CM | POA: Insufficient documentation

## 2022-01-16 LAB — RAPID URINE DRUG SCREEN, HOSP PERFORMED
Amphetamines: NOT DETECTED
Barbiturates: NOT DETECTED
Benzodiazepines: NOT DETECTED
Cocaine: NOT DETECTED
Opiates: NOT DETECTED
Tetrahydrocannabinol: NOT DETECTED

## 2022-01-16 LAB — COMPREHENSIVE METABOLIC PANEL
ALT: 29 U/L (ref 0–44)
AST: 35 U/L (ref 15–41)
Albumin: 4 g/dL (ref 3.5–5.0)
Alkaline Phosphatase: 117 U/L (ref 38–126)
Anion gap: 10 (ref 5–15)
BUN: 20 mg/dL (ref 8–23)
CO2: 22 mmol/L (ref 22–32)
Calcium: 9.2 mg/dL (ref 8.9–10.3)
Chloride: 104 mmol/L (ref 98–111)
Creatinine, Ser: 1.09 mg/dL — ABNORMAL HIGH (ref 0.44–1.00)
GFR, Estimated: 54 mL/min — ABNORMAL LOW (ref >60)
Glucose, Bld: 413 mg/dL — ABNORMAL HIGH (ref 70–99)
Potassium: 3.5 mmol/L (ref 3.5–5.1)
Sodium: 136 mmol/L (ref 135–145)
Total Bilirubin: 0.4 mg/dL (ref 0.3–1.2)
Total Protein: 7.3 g/dL (ref 6.5–8.1)

## 2022-01-16 LAB — URINALYSIS, ROUTINE W REFLEX MICROSCOPIC
Bacteria, UA: NONE SEEN
Bilirubin Urine: NEGATIVE
Glucose, UA: >=500 mg/dL — AB
Ketones, ur: NEGATIVE mg/dL
Leukocytes,Ua: NEGATIVE
Nitrite: POSITIVE — AB
Protein, ur: NEGATIVE mg/dL
Specific Gravity, Urine: 1.031 — ABNORMAL HIGH (ref 1.005–1.030)
pH: 6 (ref 5.0–8.0)

## 2022-01-16 LAB — D-DIMER, QUANTITATIVE: D-Dimer, Quant: 0.45 ug/mL-FEU (ref 0.00–0.50)

## 2022-01-16 LAB — CBC
HCT: 40.4 % (ref 36.0–46.0)
Hemoglobin: 13.8 g/dL (ref 12.0–15.0)
MCH: 29.7 pg (ref 26.0–34.0)
MCHC: 34.2 g/dL (ref 30.0–36.0)
MCV: 87.1 fL (ref 80.0–100.0)
Platelets: 223 10*3/uL (ref 150–400)
RBC: 4.64 MIL/uL (ref 3.87–5.11)
RDW: 13.1 % (ref 11.5–15.5)
WBC: 5.4 10*3/uL (ref 4.0–10.5)
nRBC: 0 % (ref 0.0–0.2)

## 2022-01-16 LAB — RESP PANEL BY RT-PCR (FLU A&B, COVID) ARPGX2
Influenza A by PCR: NEGATIVE
Influenza B by PCR: NEGATIVE
SARS Coronavirus 2 by RT PCR: NEGATIVE

## 2022-01-16 LAB — APTT: aPTT: 27 seconds (ref 24–36)

## 2022-01-16 LAB — I-STAT CHEM 8, ED
BUN: 25 mg/dL — ABNORMAL HIGH (ref 8–23)
Calcium, Ion: 1.12 mmol/L — ABNORMAL LOW (ref 1.15–1.40)
Chloride: 105 mmol/L (ref 98–111)
Creatinine, Ser: 0.9 mg/dL (ref 0.44–1.00)
Glucose, Bld: 413 mg/dL — ABNORMAL HIGH (ref 70–99)
HCT: 42 % (ref 36.0–46.0)
Hemoglobin: 14.3 g/dL (ref 12.0–15.0)
Potassium: 5.1 mmol/L (ref 3.5–5.1)
Sodium: 137 mmol/L (ref 135–145)
TCO2: 23 mmol/L (ref 22–32)

## 2022-01-16 LAB — DIFFERENTIAL
Abs Immature Granulocytes: 0.03 10*3/uL (ref 0.00–0.07)
Basophils Absolute: 0 10*3/uL (ref 0.0–0.1)
Basophils Relative: 0 %
Eosinophils Absolute: 0 10*3/uL (ref 0.0–0.5)
Eosinophils Relative: 0 %
Immature Granulocytes: 1 %
Lymphocytes Relative: 14 %
Lymphs Abs: 0.7 10*3/uL (ref 0.7–4.0)
Monocytes Absolute: 0.1 10*3/uL (ref 0.1–1.0)
Monocytes Relative: 1 %
Neutro Abs: 4.6 10*3/uL (ref 1.7–7.7)
Neutrophils Relative %: 84 %

## 2022-01-16 LAB — PROTIME-INR
INR: 0.9 (ref 0.8–1.2)
Prothrombin Time: 12.4 seconds (ref 11.4–15.2)

## 2022-01-16 LAB — ETHANOL: Alcohol, Ethyl (B): <10 mg/dL (ref <10)

## 2022-01-16 LAB — CBG MONITORING, ED
Glucose-Capillary: 391 mg/dL — ABNORMAL HIGH (ref 70–99)
Glucose-Capillary: 272 mg/dL — ABNORMAL HIGH (ref 70–99)

## 2022-01-16 LAB — GLUCOSE, CAPILLARY: Glucose-Capillary: 286 mg/dL — ABNORMAL HIGH (ref 70–99)

## 2022-01-16 MED ORDER — ONDANSETRON HCL 4 MG/2ML IJ SOLN: 4.0000 mg | Freq: Four times a day (QID) | INTRAMUSCULAR | Status: DC | PRN

## 2022-01-16 MED ORDER — SENNOSIDES-DOCUSATE SODIUM 8.6-50 MG PO TABS: 1.0000 | ORAL_TABLET | Freq: Every evening | ORAL | Status: DC | PRN

## 2022-01-16 MED ORDER — HEPARIN SODIUM (PORCINE) 5000 UNIT/ML IJ SOLN
5000.0000 [IU] | Freq: Three times a day (TID) | INTRAMUSCULAR | Status: DC
  Administered 2022-01-17 (×2): 5000 [IU] via SUBCUTANEOUS
  Filled 2022-01-16 (×4): qty 1

## 2022-01-16 MED ORDER — SODIUM CHLORIDE 0.9 % IV BOLUS
500.0000 mL | Freq: Once | INTRAVENOUS | Status: AC
  Administered 2022-01-16: 500 mL via INTRAVENOUS

## 2022-01-16 MED ORDER — ATORVASTATIN CALCIUM 40 MG PO TABS
80.0000 mg | ORAL_TABLET | Freq: Every day | ORAL | Status: DC
  Administered 2022-01-17 – 2022-01-18 (×2): 80 mg via ORAL
  Filled 2022-01-16 (×2): qty 2

## 2022-01-16 MED ORDER — CLOPIDOGREL BISULFATE 75 MG PO TABS
75.0000 mg | ORAL_TABLET | Freq: Every day | ORAL | Status: DC
Start: 2022-01-17 — End: 2022-01-18
  Administered 2022-01-17 – 2022-01-18 (×2): 75 mg via ORAL
  Filled 2022-01-16 (×2): qty 1

## 2022-01-16 MED ORDER — MELATONIN 3 MG PO TABS
6.0000 mg | ORAL_TABLET | Freq: Every day | ORAL | Status: DC
  Filled 2022-01-16: qty 2

## 2022-01-16 MED ORDER — INSULIN ASPART 100 UNIT/ML IJ SOLN: 0.0000 [IU] | Freq: Every day | INTRAMUSCULAR | Status: DC

## 2022-01-16 MED ORDER — ACETAMINOPHEN 160 MG/5ML PO SOLN: 650.0000 mg | ORAL | Status: DC | PRN

## 2022-01-16 MED ORDER — STROKE: EARLY STAGES OF RECOVERY BOOK: Freq: Once | Status: AC

## 2022-01-16 MED ORDER — DARIFENACIN HYDROBROMIDE ER 7.5 MG PO TB24
7.5000 mg | ORAL_TABLET | Freq: Every day | ORAL | Status: DC
  Administered 2022-01-17 – 2022-01-18 (×2): 7.5 mg via ORAL
  Filled 2022-01-16 (×2): qty 1

## 2022-01-16 MED ORDER — FLUTICASONE FUROATE-VILANTEROL 100-25 MCG/ACT IN AEPB
1.0000 | INHALATION_SPRAY | Freq: Every day | RESPIRATORY_TRACT | Status: DC
  Administered 2022-01-17: 1 via RESPIRATORY_TRACT
  Filled 2022-01-16: qty 28

## 2022-01-16 MED ORDER — ASPIRIN 81 MG PO CHEW
81.0000 mg | CHEWABLE_TABLET | Freq: Once | ORAL | Status: AC
  Administered 2022-01-16: 81 mg via ORAL
  Filled 2022-01-16: qty 1

## 2022-01-16 MED ORDER — ACETAMINOPHEN 325 MG PO TABS: 650.0000 mg | ORAL_TABLET | ORAL | Status: DC | PRN

## 2022-01-16 MED ORDER — INSULIN ASPART 100 UNIT/ML IJ SOLN: 0.0000 [IU] | Freq: Three times a day (TID) | INTRAMUSCULAR | Status: DC

## 2022-01-16 MED ORDER — FAMOTIDINE 20 MG PO TABS
20.0000 mg | ORAL_TABLET | Freq: Every day | ORAL | Status: DC
  Administered 2022-01-17 – 2022-01-18 (×2): 20 mg via ORAL
  Filled 2022-01-16 (×2): qty 1

## 2022-01-16 MED ORDER — ACETAMINOPHEN 650 MG RE SUPP: 650.0000 mg | RECTAL | Status: DC | PRN

## 2022-01-16 MED ORDER — EZETIMIBE 10 MG PO TABS
10.0000 mg | ORAL_TABLET | Freq: Every day | ORAL | Status: DC
  Administered 2022-01-17 – 2022-01-18 (×2): 10 mg via ORAL
  Filled 2022-01-16 (×2): qty 1

## 2022-01-16 MED ORDER — IOHEXOL 350 MG/ML SOLN
100.0000 mL | Freq: Once | INTRAVENOUS | Status: AC | PRN
  Administered 2022-01-16: 75 mL via INTRAVENOUS

## 2022-01-16 MED ORDER — ALPRAZOLAM 0.25 MG PO TABS
0.2500 mg | ORAL_TABLET | Freq: Every day | ORAL | Status: DC
  Administered 2022-01-17: 0.25 mg via ORAL
  Filled 2022-01-16: qty 1

## 2022-01-16 MED ORDER — ALBUTEROL SULFATE (2.5 MG/3ML) 0.083% IN NEBU: 3.0000 mL | INHALATION_SOLUTION | RESPIRATORY_TRACT | Status: DC | PRN

## 2022-01-16 MED ORDER — LORATADINE 10 MG PO TABS
10.0000 mg | ORAL_TABLET | Freq: Every day | ORAL | Status: DC
Start: 2022-01-17 — End: 2022-01-18
  Administered 2022-01-17 – 2022-01-18 (×2): 10 mg via ORAL
  Filled 2022-01-16 (×2): qty 1

## 2022-01-16 NOTE — Consult Note (Signed)
[facility] Telemedicine Consult   Requesting Provider: Dr. Regenia Skeeter  Chief Complaint: not feeling well, right side numbness and weakness  HPI: 71 year old female with history of hypertension hyperlipidemia, diabetes CHF, previous stroke presented to ED for code stroke.  Patient stated that since yesterday she had on and off right hand tingling, right-sided numbness and mild weakness.  She did not come to hospital for regulation.  This morning she woke up she still has lingering symptoms but a mild, she went to urgent care, also reported congestion and sore throat, she was given steroids and breathing treatment.  In the afternoon she was working in the kitchen around 3 PM, did not feel well, she wanted to lie down in the bedroom, however she only walked in the living room and felt lightheadedness and passed out.  Husband called 911, on arrival, reported BP 180s, glucose 406, still complaining of right hand tingling, right lip tingling and right leg heaviness.  She was sent to [facility], BP 163/77, glucose 391, heart rate 100.  CT no acute abnormality.  She takes Plavix at home and she took this morning.  She was admitted in 09/2016 for right hand and foot tingling. MRI negative at that time, LDL 163 and A1C 6.9.  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  She was discharged on aspirin and Lipitor at that time.  LKW: Yesterday tpa given?: No, outside window IR Thrombectomy? No, no LVO sign and low NIH score Modified Rankin Scale: 1-No significant post stroke disability and can perform usual duties with stroke symptoms   Exam: Vitals:   01/16/22 1730 01/16/22 1740  BP: (!) 163/77   Pulse:  100  Resp:  17  Temp:    SpO2:  96%     Temp:  [98.5 F (36.9 C)] 98.5 F (36.9 C) (02/21 1659) Pulse Rate:  [100-102] 100 (02/21 1740) Resp:  [17-29] 17 (02/21 1740) BP: (163-166)/(77-138) 163/77 (02/21 1730) SpO2:  [94  %-96 %] 96 % (02/21 1740) Weight:  [79.8 kg] 79.8 kg (02/21 1653)  General - Well nourished, well developed, in no apparent distress.  Ophthalmologic - fundi not visualized due to noncooperation.  Cardiovascular - Regular rhythm and rate.  Neuro - awake, alert, eyes open, orientated to age, place, time and people. No aphasia, fluent language, following all simple commands. Able to name and repeat. No gaze palsy, tracking bilaterally, visual field full.  Mild right facial droop. Tongue protrusion slightly to the right.  Left upper and lower extremity 5/5, right upper extremity drift and right lower extremity drift but not hitting bed within 10/5 seconds. Sensation decreased on the right, b/l FTN intact, gait not tested.     NIH Stroke Scale  Level Of Consciousness 0=Alert; keenly responsive 1=Arouse to minor stimulation 2=Requires repeated stimulation to arouse or movements to pain 3=postures or unresponsive 0  LOC Questions to Month and Age 68=Answers both questions correctly 1=Answers one question correctly or dysarthria/intubated/trauma/language barrier 2=Answers neither question correctly or aphasia 0  LOC Commands      -Open/Close eyes     -Open/close grip     -Pantomime commands if communication barrier 0=Performs both tasks correctly 1=Performs one task correctly 2=Performs neighter task correctly 0  Best Gaze     -Only assess horizontal gaze 0=Normal 1=Partial gaze palsy 2=Forced deviation, or total gaze paresis 0  Visual 0=No visual loss 1=Partial hemianopia 2=Complete hemianopia 3=Bilateral hemianopia (blind including cortical blindness) 0  Facial Palsy     -Use grimace if obtunded 0=Normal symmetrical movement 1=Minor paralysis (asymmetry) 2=Partial paralysis (lower face) 3=Complete paralysis (upper and lower face) 1  Motor  0=No drift for 10/5 seconds 1=Drift, but does not hit bed 2=Some antigravity effort, hits  bed 3=No effort against gravity, limb falls 4=No  movement 0=Amputation/joint fusion Right Arm 1     Leg 1    Left Arm 0     Leg 0  Limb Ataxia     - FNT/HTS 0=Absent or does not understand or paralyzed or amputation/joint fusion 1=Present in one limb 2=Present in two limbs 0  Sensory 0=Normal 1=Mild to moderate sensory loss 2=Severe to total sensory loss or coma/unresponsive 1  Best Language 0=No aphasia, normal 1=Mild to moderate aphasia 2=Severe aphasia 3=Mute, global aphasia, or coma/unresponsive 0  Dysarthria 0=Normal 1=Mild to moderate 2=Severe, unintelligible or mute/anarthric 0=intubated/unable to test 0  Extinction/Neglect 0=No abnormality 1=visual/tactile/auditory/spatia/personal inattention/Extinction to bilateral simultaneous stimulation 2=Profound neglect/extinction more than 1 modality  0  Total   4      Imaging Reviewed:   CT HEAD CODE STROKE WO CONTRAST  Result Date: 01/16/2022 CLINICAL DATA:  Neuro deficit, acute, stroke suspected. Additional history provided: Right-sided tingling, dizziness with fall and loss of consciousness today. EXAM: CT HEAD WITHOUT CONTRAST TECHNIQUE: Contiguous axial images were obtained from the base of the skull through the vertex without intravenous contrast. RADIATION DOSE REDUCTION: This exam was performed according to the departmental dose-optimization program which includes automated exposure control, adjustment of the mA and/or kV according to patient size and/or use of iterative reconstruction technique. COMPARISON:  BRAIN MRI 08/22/2021. HEAD CT 06/10/2019. FINDINGS: Brain: Mild generalized cerebral atrophy. Known chronic lacunar infarcts within the left corona radiata and left thalamus. Known subcentimeter chronic infarct within the right cerebellar hemisphere. Some of these infarcts were better appreciated on the prior brain MRI of 08/22/2021. There is no acute intracranial hemorrhage. No demarcated cortical infarct. No extra-axial fluid collection. No evidence of an intracranial  mass. No midline shift. Vascular: No hyperdense vessel.  Atherosclerotic calcifications. Skull: Normal. Negative for fracture or focal lesion. Sinuses/Orbits: Visualized orbits show no acute finding. Mild mucosal thickening and fluid within the bilateral ethmoid sinuses. Trace mucosal thickening within the right sphenoid sinus. Trace mucosal thickening, and small volume frothy secretions, within the left sphenoid sinus. Mild mucosal thickening within the bilateral maxillary sinuses. The left maxillary sinus is asymmetrically small. ASPECTS [facility] Stroke Program Early CT Score) - Ganglionic level infarction (caudate, lentiform nuclei, internal capsule, insula, M1-M3 cortex): 7 - Supraganglionic infarction (M4-M6 cortex): 3 Total score (0-10 with 10 being normal): 10 Other: Right mastoid effusion. These results were called by telephone at the time of interpretation on 01/16/2022 at 5:42 pm to provider Dr. Regenia Skeeter, who verbally acknowledged these results. IMPRESSION: No evidence of acute intracranial abnormality. Known chronic infarcts within the left corona radiata, left thalamus and right cerebellar hemisphere. Mild generalized cerebral atrophy. Paranasal sinus disease, as described. Right mastoid effusion. Electronically Signed   By: Kellie Simmering D.O.   On: 01/16/2022 17:44     Labs reviewed in epic and pertinent values follow: Platelet 223, creatinine 1.09  Assessment:  71 year old female with history of hypertension hyperlipidemia, diabetes CHF, previous stroke presented to ED for on and off right-sided tingling and weakness since yesterday.  Her symptoms did not resolve but lingering and mild.  Today she had episode of lightheadedness, passed out and fell at home.  In ED, patient had mild right facial  droop and right arm and leg hemiparesis and hemiparesthesia.  NIH score 4.  CT no acute abnormality but chronic infarct left CR, left thalamus and right cerebellum.  Patient not on tPA candidate given  outside window, not IR candidate given no LVO signs and low NIH score.  Etiology for patient's symptoms concerning for minor small vessel stroke, recommend further stroke work-up.  Patient took Plavix this morning, will give aspirin load tonight.   Recommendations:  Continue further stroke work up  Frequent neuro checks Telemetry monitoring MRI brain  CTA head and neck Echocardiogram  UDS, fasting lipid panel and HgbA1C PT/OT/speech consult Permissive hypertension (only treat if BP > 220/120 unless a lower blood pressure is clinically necessary) for 24-48 hours post stroke onset GI and DVT prophylaxis  Continue home Plavix tomorrow, will give aspirin 325 load tonight and continue aspirin 81 tomorrow. Continue home statin Stroke risk factor modification Discussed with Dr. Regenia Skeeter ED physician [facility] tele neuro will follow up in am    Consult Participants: RN, patient, husband, code stroke coordinator Location of the provider: [facility] Location of the patient: [facility]  Time Code Stroke Page received:  5:27 p.m. Time neurologist arrived: 5:29 PM Time NIHSS completed: 5:42 PM  This consult was provided via telemedicine with 2-way video and audio communication. The patient/family was informed that care would be provided in this way and agreed to receive care in this manner.   This patient is receiving care for possible acute neurological changes. There was 50 minutes of care by this provider at the time of service, including time for direct evaluation via telemedicine, review of medical records, imaging studies and discussion of findings with providers, the patient and/or family.  Rosalin Hawking, MD PhD Stroke Neurology 01/16/2022 6:06 PM

## 2022-01-16 NOTE — Progress Notes (Signed)
Dr. Erlinda Hong connected to telestroke cart for code stroke evaluation at 1729. Patient returned to room from [place] at [place].

## 2022-01-16 NOTE — ED Triage Notes (Signed)
Pt to the ED with [facility] after a syncopal episode where she states she lost consciousness and hit her head on the hardwood. Pt states she takes Plavix.    Pt was seen at [facility] this morning and prescribed steroids and antibiotics for a respiratory virus.

## 2022-01-16 NOTE — ED Notes (Signed)
Delay in taking pt to CT due to pt notifying bedside RN and tele-stroke RN that she has been having some intermittent tingling in her right arm since yesterday. PA called to bedside to clarify if this was still going to be a code stroke. PA spoke with pt again about LKW and determined it to still be 1450 today. Pt then taken directly to CT with RN at bedside.

## 2022-01-16 NOTE — Assessment & Plan Note (Addendum)
Personally reviewed telemetry--no concerning dysrhythmia\ Clinical history suggestive of vasovagal/dysautonomia from her poorly controlled diabetes mellitus Check orthostatics--positive Personally reviewed EKG--sinus rhythm, nonspecific T wave change 01/17/22 Echo EF 65-70%, no WMA, G1DD, mild TR, no PFO Personally reviewed chest x-ray--no infiltrates or edema IVF given with symptomatic improvement

## 2022-01-16 NOTE — Assessment & Plan Note (Deleted)
Initially thought CVA workup 2/2 to right face, upper and lower extremity paresthesias No stroke seen on MRI CTA head and neck without clinically significant occlusion/stenosis Echo in the AM ST/OT/PT eval and treat Monitor on tele Continue plavix Neuro did recommend permissive HTN as well Hgb A1C and Lipid panel in the AM Neuro checks Continue to monitor

## 2022-01-16 NOTE — Assessment & Plan Note (Addendum)
Permissive HTN per neuro Hold amlodipine>>restart after d/c Treat for systolic > 453

## 2022-01-16 NOTE — Progress Notes (Signed)
Code stroke activated at 1717. EDP at bedside at 1717. Taken to CT at 1725.

## 2022-01-16 NOTE — H&P (Signed)
History and Physical    Patient: Yvonne [name] INO:[id number] DOB: 28-Sep-1951 DOA: 01/16/2022 DOS: the patient was seen and examined on 01/16/2022 PCP: [name], MD  Patient coming from: Home  Chief Complaint:  Chief Complaint  Patient presents with   Loss of Consciousness    HPI: Yvonne [name] is a 71 y.o. female with history of anxiety, GERD, hyperlipidemia, hypertension, type 2 diabetes mellitus, history of TIA, and history of stroke presents ED with chief complaint of syncope.  Patient reports that she takes care of 2 young grandchildren.  She just got home from school, and she was feeling exhausted.  She started to have paresthesias in her face that then moved to her arm and leg, so she told her grandson she was going to go lay down.  She was not getting up from sitting down, but already walking around, when she passed out.  She does not think she was out very long.  Her grandson called 911.  Patient reports that she did hit her head on the floor.  She has had a headache for couple of days, and the headache continues still.  It is mild.  Patient reports that she had right-sided face, arm, leg weakness when she woke up.  She does remember feeling very dizzy before she passed out.  She did not have chest pain.  She does admit to palpitations.  Patient reports that she has been having palpitations on and off for a week, they are associated with exertion.  Patient reports she was almost immediately oriented when she came to.  She has no personal history of seizure.  She reports at 1 point her neurologist had previous TIAs may have been seizures, but she was never started on medication for seizure.  Patient does report her grandson has seizures.  Patient reports that she takes Xanax 1 mg in the morning half a milligram at lunch and 1 mg every night.  She had missed 2 doses; the morning and lunch dose today, because she was running late.  Patient also reports that her stress levels have been very  high.  Her daughter is currently admitted at stroke, hospital for stroke, and her son recently had an amputation of his lower extremity and then osteomyelitis, and then revision of the amputation, and is now on antibiotics with a PICC line at home.  Patient has no other complaints at this time.  Of note she had a stroke in 2018.  Symptoms at that time included right-sided paresthesias, headache, blood pressure 200/100.  She reports that she has permanent numbness in her right face and right hand from that stroke.  She specifies right hand, not right upper extremity.  Since then she has had intermittent paresthesias of her right foot as well.  Patient does not smoke, does not drink, does not use illicit drugs.  Patient is vaccinated for COVID.  Patient is DNR.  Review of Systems: As mentioned in the history of present illness. All other systems reviewed and are negative. Past Medical History:  Diagnosis Date   Anxiety    Arthritis    left hand   Asthma    daily and prn inhalers   Chronic cough    Chronic otitis media 12/2017   Full dentures    GERD (gastroesophageal reflux disease)    History of stroke 09/2017   weakness right hand, numbness right side face   Hyperlipidemia    Hypertension    states under control with meds., has  been on med. x a long time, per pt.   Non-insulin dependent type 2 diabetes mellitus (HCC)    Overactive bladder    Recurrent acute suppurative otitis media without spontaneous rupture of left tympanic membrane 02/19/2018   Transient cerebral ischemia    Past Surgical History:  Procedure Laterality Date   ABDOMINAL HYSTERECTOMY     complete   CATARACT EXTRACTION W/ INTRAOCULAR LENS IMPLANT Right    CHOLECYSTECTOMY     COLONOSCOPY N/A 08/17/2015   Procedure: COLONOSCOPY;  Surgeon: Rogene Houston, MD;  Location: [facility];  Service: Endoscopy;  Laterality: N/A;  200 - moved to 7:30 - Ann notified pt   GAS INSERTION Right    x 2 - eye   LACRIMAL DUCT  EXPLORATION Bilateral    removal of tear ducts   MYRINGOTOMY WITH TUBE PLACEMENT Bilateral 01/28/2018   Procedure: BILATERAL MYRINGOTOMY WITH TUBE PLACEMENT;  Surgeon: Leta Baptist, MD;  Location: [facility];  Service: ENT;  Laterality: Bilateral;   VITRECTOMY Left 09/14/2015   Social History:  reports that she has never smoked. She has never used smokeless tobacco. She reports that she does not currently use alcohol. She reports that she does not use drugs.  Allergies  Allergen Reactions   Penicillins Hives and Itching    Has patient had a PCN reaction causing immediate rash, facial/tongue/throat swelling, SOB or lightheadedness with hypotension: Yes Has patient had a PCN reaction causing severe rash involving mucus membranes or skin necrosis: Unk Has patient had a PCN reaction that required hospitalization: No Has patient had a PCN reaction occurring within the last 10 years: No If all of the above answers are "NO", then may proceed with Cephalosporin use.  Has patient had a PCN reaction causing immediate rash, facial/tongue/throat swelling, SOB or lightheadedness with hypotension: Yes Has patient had a PCN reaction causing severe rash involving mucus membranes or skin necrosis: No Has patient had a PCN reaction that required hospitalization No Has patient had a PCN reaction occurring within the last 10 years: No If all of the above answers are "NO", then may proceed with Cephalosporin use. Has patient had a PCN reaction causing immediate rash, facial/tongue/throat swelling, SOB or lightheadedness with hypotension: Yes Has patient had a PCN reaction causing severe rash involving mucus membranes or skin necrosis: Unk Has patient had a PCN reaction that required hospitalization: No Has patient had a PCN reaction occurring within the last 10 years: No If all of the above answers are "NO", then may proceed with Cephalosporin use. Has patient had a PCN reaction causing immediate  rash, facial/tongue/throat swelling, SOB or lightheadedness with hypotension: Yes Has patient had a PCN reaction causing severe rash involving mucus membranes or skin necrosis: No Has patient had a PCN reaction that required hospitalization No Has patient had a PCN reaction occurring within the last 10 years: No If all of the above answers are "NO", then may proceed with Cephalosporin use. Has patient had a PCN reaction causing immediate rash, facial/tongue/throat swelling, SOB or lightheadedness with hypotension: Yes Has patient had a PCN reaction causing sev... (TRUNCATED)   Gabapentin Other (See Comments)    Causes a lot of lethargy at a higher dose Causes a lot of lethargy at a higher dose Causes a lot of lethargy at a higher dose   Lisinopril Cough    HAS A CHRONIC COUGH AND DID NOT NOTICE IMPROVEMENT OF COUGH WHEN LISINOPRIL WAS STOPPED HAS A CHRONIC COUGH AND DID NOT NOTICE  IMPROVEMENT OF COUGH WHEN LISINOPRIL WAS STOPPED HAS A CHRONIC COUGH AND DID NOT NOTICE IMPROVEMENT OF COUGH WHEN LISINOPRIL WAS STOPPED   Soap Rash    DIAL soap    Family History  Problem Relation Age of Onset   Arthritis Mother    Diabetes Mother    CVA Mother    Stroke Mother    Diabetes Father    Heart disease Father        CABG.  Does not know age of onset   Asthma Father    Hepatitis C Sister    Arthritis Brother    Cancer Brother        metastic cancer   Peripheral Artery Disease Daughter    Arthritis-Osteo Son     Prior to Admission medications   Medication Sig Start Date End Date Taking? Authorizing Provider  albuterol (VENTOLIN HFA) 108 (90 Base) MCG/ACT inhaler TAKE 2 PUFFS BY MOUTH EVERY 6 HOURS AS NEEDED FOR WHEEZE OR SHORTNESS OF BREATH Patient taking differently: Inhale 2 puffs into the lungs every 6 (six) hours as needed for shortness of breath. 12/11/21  Yes [name], MD  alendronate (FOSAMAX) 70 MG tablet Take 1 tablet (70 mg total) by mouth every 7 (seven) days. Take with a  full glass of water on an empty stomach. 03/15/21  Yes [name], MD  ALPRAZolam Duanne Moron) 1 MG tablet ! Each morning, one each evening, and 1/2 each afternoon 11/30/21  Yes [name], [name], MD  amLODipine (NORVASC) 10 MG tablet TAKE 1 TABLET BY MOUTH EVERY DAY 11/30/21  Yes [name], MD  atorvastatin (LIPITOR) 80 MG tablet Take 1 tablet (80 mg total) by mouth daily. 02/15/21  Yes Chandrasekhar, Mahesh A, MD  clopidogrel (PLAVIX) 75 MG tablet TAKE 1 TABLET BY MOUTH EVERY DAY WITH BREAKFAST 11/30/21  Yes [name], [name], MD  dapagliflozin propanediol (FARXIGA) 10 MG TABS tablet Take 1 tablet (10 mg total) by mouth daily. 01/11/22  Yes [name], [name], MD  ezetimibe (ZETIA) 10 MG tablet TAKE 1 TABLET BY MOUTH DAILY. FOR CHOLESTEROL 11/30/21  Yes [name], [name], MD  famotidine (PEPCID) 20 MG tablet Take 20 mg by mouth at bedtime.   Yes [provider]  Fexofenadine HCl (ALLEGRA PO) Take 1 tablet by mouth daily.   Yes [provider]  furosemide (LASIX) 20 MG tablet Take 1 tablet (20 mg total) by mouth daily. 11/02/20  Yes [name], MD  glipiZIDE (GLUCOTROL) 5 MG tablet Take 1 tablet (5 mg total) by mouth daily before breakfast. 11/30/21  Yes [name], [name], MD  ofloxacin (FLOXIN) 0.3 % OTIC solution Place 5 drops into both ears 2 (two) times daily.   Yes [provider]  Semaglutide, 2 MG/DOSE, (OZEMPIC, 2 MG/DOSE,) 8 MG/3ML SOPN Inject 2 mg into the skin once a week. 11/30/21  Yes [name], [name], MD  solifenacin (VESICARE) 10 MG tablet Take 10 mg by mouth daily. 07/25/21  Yes [provider]  Accu-Chek Softclix Lancets lancets Test BS up to 4 times daily Dx E11.69 04/04/20   [name], MD  Blood Glucose Monitoring Suppl (ACCU-CHEK GUIDE) w/Device KIT Test Bs QID Dx E11.69 02/28/21   [name], MD  fluticasone furoate-vilanterol (BREO ELLIPTA) 100-25 MCG/INH AEPB Inhale 1 puff into the lungs daily. Patient not taking: Reported on 01/16/2022 11/02/20   [name],  MD  glucose blood (ACCU-CHEK GUIDE) test strip TEST BLOOD SUGAR 4 TIMES DAILY Dx E11.69 03/27/21   [name], MD  guaiFENesin-codeine (CHERATUSSIN AC) 100-10 MG/5ML  syrup Take 5 mLs by mouth every 4 (four) hours as needed for cough. Patient not taking: Reported on 01/16/2022 12/13/21   [name], MD  levofloxacin (LEVAQUIN) 500 MG tablet Take 500 mg by mouth daily. 01/16/22   [provider]  loratadine (CLARITIN) 10 MG tablet Take 1 tablet (10 mg total) by mouth daily. Patient not taking: Reported on 01/16/2022 02/18/19   [name], MD  nitroGLYCERIN (NITROSTAT) 0.4 MG SL tablet Place 1 tablet (0.4 mg total) under the tongue every 5 (five) minutes as needed for chest pain. 02/15/21   Werner Lean, MD  predniSONE (DELTASONE) 10 MG tablet Take by mouth. 01/16/22   [provider]    Physical Exam: Vitals:   01/16/22 1830 01/16/22 1945 01/16/22 2100 01/16/22 2131  BP: (!) 151/94 (!) 160/85 (!) 141/78 (!) 154/103  Pulse: 94 98 84 86  Resp: (!) $RemoveB'21 18 18 18  'sgpENyCW$ Temp:    97.7 F (36.5 C)  TempSrc:    Oral  SpO2: 95% 96% 96% 96%  Weight:      Height:       1.  General: Patient lying supine in bed,  no acute distress   2. Psychiatric: Alert and oriented x 3, mood and behavior normal for situation, pleasant and cooperative with exam   3. Neurologic: Speech and language are normal, face is symmetric, moves all 4 extremities voluntarily, pronator drift with right upper extremity, weakness in the right lower extremity compared to left, face is symmetric, tongue protrudes midline.   4. HEENMT:  Head is atraumatic, normocephalic, pupils reactive to light, neck is supple, trachea is midline, mucous membranes are moist   5. Respiratory : Lungs are clear to auscultation bilaterally without wheezing, rhonchi, rales, no cyanosis, no increase in work of breathing or accessory muscle use   6. Cardiovascular : Heart rate normal, rhythm is regular, no murmurs, rubs or  gallops, no peripheral edema, peripheral pulses palpated   7. Gastrointestinal:  Abdomen is soft, nondistended, nontender to palpation bowel sounds active, no masses or organomegaly palpated   8. Skin:  Skin is warm, dry and intact without rashes, acute lesions, or ulcers on limited exam   9.Musculoskeletal:  No acute deformities or trauma, no asymmetry in tone, no peripheral edema, peripheral pulses palpated, no tenderness to palpation in the extremities   Data Reviewed: In the ED Temp 98.5, heart rate 94-102, respiratory 17-29, blood pressure 149/93-167/81, satting 94-96% No leukocytosis with a white blood cell count of 5.4, hemoglobin 13.8 Chemistry panel is unremarkable aside from a hyperglycemia of 413 Alcohol level is undetectable Tele neuro saw patient and recommends neurochecks, telemetry monitoring, MRI brain, CTA head and neck, GI and DVT prophylaxis, aspirin tonight, resume Plavix tomorrow, echo, UDS, lipid panel, hemoglobin A1c, PT/OT/ST, permissive hypertension CTA head and neck shows no intracranial large vessel occlusion or flow-limiting stenosis.  There is mild stenosis in the right P2 segment, mild atherosclerotic stenosis in the cavernous carotid bilaterally, no significant carotid or vertebral artery stenosis in the neck.  Mild atherosclerotic disease of the carotid bifurcation bilaterally MRI brain is negative for acute infarct but there are mild chronic ischemic changes. CT head shows no evidence of acute intracranial abnormality, chronic infarcts within the left corona radiata, left thalamus, right cerebellar hemisphere At arrival patient's NIHSS was 4, on my exam NIHSS 2 Assessment and Plan: * Syncope and collapse- (present on admission) LOC, hit head on hardwood floor Echo, tele as above EEG in the AM CT  head, CTA head and neck, and MRI without acute findings Continue to monitor  TIA (transient ischemic attack)- (present on admission) Initially thought CVA  workup 2/2 to right face, upper and lower extremity paresthesias No stroke seen on MRI CTA head and neck without clinically significant occlusion/stenosis Echo in the AM ST/OT/PT eval and treat Monitor on tele Continue plavix Neuro did recommend permissive HTN as well Hgb A1C and Lipid panel in the AM Neuro checks Continue to monitor  HLD (hyperlipidemia)- (present on admission) Lipid panel in the AM Continue with statin  Diabetes mellitus, type II (HCC) Holding faxiga,glipizide, and ozempic Carb modified diet Glucose currently uncontrolled at 413 q4 hour sliding scale until glucose is controlled, then space to ACHS Hgb A1C in the AM Continue to monitor  Acid reflux disease- (present on admission) Continue pepcid Continue to monitor  GAD (generalized anxiety disorder)- (present on admission) Reduced dose of xanax given recent fall Continue to monitor  Hypertension- (present on admission) Permissive HTN per neuro Hold amlodipine Treat for systolic > 159       Advance Care Planning:   Code Status: Prior DNR  Consults: Inpatient tele neuro  Family Communication: No family at bedside  Severity of Illness: The appropriate patient status for this patient is OBSERVATION. Observation status is judged to be reasonable and necessary in order to provide the required intensity of service to ensure the patient's safety. The patient's presenting symptoms, physical exam findings, and initial radiographic and laboratory data in the context of their medical condition is felt to place them at decreased risk for further clinical deterioration. Furthermore, it is anticipated that the patient will be medically stable for discharge from the hospital within 2 midnights of admission.   Author: Rolla Plate, DO 01/16/2022 9:36 PM  For on call review [email or web address].

## 2022-01-16 NOTE — Assessment & Plan Note (Addendum)
LDL 132 Continue with statin Patient states that she has not been fully compliant at home

## 2022-01-16 NOTE — Assessment & Plan Note (Deleted)
Holding faxiga,glipizide, and ozempic Carb modified diet Glucose currently uncontrolled at 413 q4 hour sliding scale until glucose is controlled, then space to ACHS Hgb A1C in the AM Continue to monitor

## 2022-01-16 NOTE — Progress Notes (Deleted)
Patient did not want visit due to visiting urgent care earlier today and forgot to call to cancel appointment.

## 2022-01-16 NOTE — Assessment & Plan Note (Addendum)
PDMP reviewed Patient receives xanax 1 mg, #75 monthly

## 2022-01-16 NOTE — ED Notes (Signed)
Patient going to MRI

## 2022-01-16 NOTE — ED Provider Notes (Signed)
[facility] EMERGENCY DEPARTMENT Provider Note   CSN: [id number] Arrival date & time: 01/16/22  8502  An emergency department physician performed an initial assessment on this suspected stroke patient at 1719.  History  Chief Complaint  Patient presents with   Loss of Consciousness    Yvonne [name] is a 71 y.o. female who presents to the ED today via EMS for syncopal episode.  Per triage note patient had syncopal episode where she lost consciousness and hit her head on the hardwood, she takes Plavix.  Incidentally triage note reports that she was seen at urgent care this morning and prescribed steroids and antibiotics for respiratory virus.  Patient tells me that she went to urgent care today as she has been feeling ill.  She went home after picking up her grandkids from school.  She states that she wanted to down to take a nap when she began feeling a tingling sensation to the right side of her face, arm, leg.  She states that she then passed out, is unsure why.  The next thing she remembers she woke up on the ground with her grandsons over her.  One of them was calling his mom and dad and the other was calling 911.  Does report history of stroke in the past with similar symptoms.  Reports last known normal just prior to 2:50 PM today.   The history is provided by the patient and medical records.      Home Medications Prior to Admission medications   Medication Sig Start Date End Date Taking? Authorizing Provider  albuterol (VENTOLIN HFA) 108 (90 Base) MCG/ACT inhaler TAKE 2 PUFFS BY MOUTH EVERY 6 HOURS AS NEEDED FOR WHEEZE OR SHORTNESS OF BREATH Patient taking differently: Inhale 2 puffs into the lungs every 6 (six) hours as needed for shortness of breath. 12/11/21  Yes [name], MD  alendronate (FOSAMAX) 70 MG tablet Take 1 tablet (70 mg total) by mouth every 7 (seven) days. Take with a full glass of water on an empty stomach. 03/15/21  Yes [name], MD  ALPRAZolam Duanne Moron) 1  MG tablet ! Each morning, one each evening, and 1/2 each afternoon 11/30/21  Yes [name], [name], MD  amLODipine (NORVASC) 10 MG tablet TAKE 1 TABLET BY MOUTH EVERY DAY 11/30/21  Yes [name], MD  atorvastatin (LIPITOR) 80 MG tablet Take 1 tablet (80 mg total) by mouth daily. 02/15/21  Yes Chandrasekhar, Mahesh A, MD  clopidogrel (PLAVIX) 75 MG tablet TAKE 1 TABLET BY MOUTH EVERY DAY WITH BREAKFAST 11/30/21  Yes [name], [name], MD  dapagliflozin propanediol (FARXIGA) 10 MG TABS tablet Take 1 tablet (10 mg total) by mouth daily. 01/11/22  Yes [name], [name], MD  ezetimibe (ZETIA) 10 MG tablet TAKE 1 TABLET BY MOUTH DAILY. FOR CHOLESTEROL 11/30/21  Yes [name], [name], MD  famotidine (PEPCID) 20 MG tablet Take 20 mg by mouth at bedtime.   Yes [provider]  Fexofenadine HCl (ALLEGRA PO) Take 1 tablet by mouth daily.   Yes [provider]  furosemide (LASIX) 20 MG tablet Take 1 tablet (20 mg total) by mouth daily. 11/02/20  Yes [name], MD  glipiZIDE (GLUCOTROL) 5 MG tablet Take 1 tablet (5 mg total) by mouth daily before breakfast. 11/30/21  Yes [name], [name], MD  ofloxacin (FLOXIN) 0.3 % OTIC solution Place 5 drops into both ears 2 (two) times daily.   Yes [provider]  Semaglutide, 2 MG/DOSE, (OZEMPIC, 2 MG/DOSE,) 8 MG/3ML SOPN Inject 2 mg  into the skin once a week. 11/30/21  Yes [name], [name], MD  solifenacin (VESICARE) 10 MG tablet Take 10 mg by mouth daily. 07/25/21  Yes [provider]  Accu-Chek Softclix Lancets lancets Test BS up to 4 times daily Dx E11.69 04/04/20   [name], MD  Blood Glucose Monitoring Suppl (ACCU-CHEK GUIDE) w/Device KIT Test Bs QID Dx E11.69 02/28/21   [name], MD  fluticasone furoate-vilanterol (BREO ELLIPTA) 100-25 MCG/INH AEPB Inhale 1 puff into the lungs daily. Patient not taking: Reported on 01/16/2022 11/02/20   [name], MD  glucose blood (ACCU-CHEK GUIDE) test strip TEST BLOOD SUGAR 4 TIMES DAILY Dx E11.69 03/27/21    [name], MD  guaiFENesin-codeine (CHERATUSSIN AC) 100-10 MG/5ML syrup Take 5 mLs by mouth every 4 (four) hours as needed for cough. Patient not taking: Reported on 01/16/2022 12/13/21   [name], MD  levofloxacin (LEVAQUIN) 500 MG tablet Take 500 mg by mouth daily. 01/16/22   [provider]  loratadine (CLARITIN) 10 MG tablet Take 1 tablet (10 mg total) by mouth daily. Patient not taking: Reported on 01/16/2022 02/18/19   [name], MD  nitroGLYCERIN (NITROSTAT) 0.4 MG SL tablet Place 1 tablet (0.4 mg total) under the tongue every 5 (five) minutes as needed for chest pain. 02/15/21   Werner Lean, MD  predniSONE (DELTASONE) 10 MG tablet Take by mouth. 01/16/22   [provider]      Allergies    Penicillins, Gabapentin, Lisinopril, and Soap    Review of Systems   Review of Systems  Constitutional:  Positive for fatigue. Negative for chills and fever.  Cardiovascular:  Positive for syncope.  Gastrointestinal:  Negative for nausea and vomiting.  Neurological:  Positive for syncope and numbness.  All other systems reviewed and are negative.  Physical Exam Updated Vital Signs BP (!) 154/103 (BP Location: Left Arm)    Pulse 86    Temp 97.7 F (36.5 C) (Oral)    Resp 18    Ht 5' 2" (1.575 m)    Wt 82 kg    SpO2 96%    BMI 33.06 kg/m  Physical Exam Vitals and nursing note reviewed.  Constitutional:      Appearance: She is not ill-appearing or diaphoretic.  HENT:     Head: Normocephalic and atraumatic.  Eyes:     Extraocular Movements: Extraocular movements intact.     Conjunctiva/sclera: Conjunctivae normal.     Pupils: Pupils are equal, round, and reactive to light.  Cardiovascular:     Rate and Rhythm: Normal rate and regular rhythm.  Pulmonary:     Effort: Pulmonary effort is normal.     Breath sounds: Normal breath sounds. No wheezing, rhonchi or rales.  Abdominal:     Palpations: Abdomen is soft.     Tenderness: There is no abdominal  tenderness.  Musculoskeletal:     Cervical back: Neck supple.  Skin:    General: Skin is warm and dry.  Neurological:     Mental Status: She is alert.     Comments: Alert and oriented to self, place, time and event.   Speech is fluent, clear without dysarthria or dysphasia.   Strength decreased to RUE and RLE.  Subjective decreased sensation to right side.   + right pronator drift.  Normal finger-to-nose and feet tapping.  CN I not tested  CN II grossly intact visual fields bilaterally. Did not visualize posterior eye.  CN III, IV, VI PERRLA and EOMs intact bilaterally  CN V Intact sensation to sharp and light touch to the face  CN VII facial movements symmetric  CN VIII not tested  CN IX, X no uvula deviation, symmetric rise of soft palate  CN XI 5/5 SCM and trapezius strength bilaterally  CN XII Midline tongue protrusion, symmetric L/R movements     ED Results / Procedures / Treatments   Labs (all labs ordered are listed, but only abnormal results are displayed) Labs Reviewed  COMPREHENSIVE METABOLIC PANEL - Abnormal; Notable for the following components:      Result Value   Glucose, Bld 413 (*)    Creatinine, Ser 1.09 (*)    GFR, Estimated 54 (*)    All other components within normal limits  URINALYSIS, ROUTINE W REFLEX MICROSCOPIC - Abnormal; Notable for the following components:   Color, Urine STRAW (*)    Specific Gravity, Urine 1.031 (*)    Glucose, UA >=500 (*)    Hgb urine dipstick SMALL (*)    Nitrite POSITIVE (*)    All other components within normal limits  GLUCOSE, CAPILLARY - Abnormal; Notable for the following components:   Glucose-Capillary 286 (*)    All other components within normal limits  I-STAT CHEM 8, ED - Abnormal; Notable for the following components:   BUN 25 (*)    Glucose, Bld 413 (*)    Calcium, Ion 1.12 (*)    All other components within normal limits  CBG MONITORING, ED - Abnormal; Notable for the following components:    Glucose-Capillary 391 (*)    All other components within normal limits  CBG MONITORING, ED - Abnormal; Notable for the following components:   Glucose-Capillary 272 (*)    All other components within normal limits  RESP PANEL BY RT-PCR (FLU A&B, COVID) ARPGX2  ETHANOL  PROTIME-INR  APTT  CBC  DIFFERENTIAL  RAPID URINE DRUG SCREEN, HOSP PERFORMED  D-DIMER, QUANTITATIVE  LIPID PANEL  HEMOGLOBIN A1C  COMPREHENSIVE METABOLIC PANEL  VITAMIN V76  FOLATE  VITAMIN B1  I-STAT CHEM 8, ED    EKG EKG Interpretation  Date/Time:  Tuesday January 16 2022 17:06:29 EST Ventricular Rate:  98 PR Interval:  181 QRS Duration: 96 QT Interval:  374 QTC Calculation: 478 R Axis:   -5 Text Interpretation: Sinus rhythm Low voltage, precordial leads T wave changes similar to 2020 Confirmed by Sherwood Gambler [phone]) on 01/16/2022 5:43:21 PM  Radiology CT ANGIO HEAD NECK W WO CM  Result Date: 01/16/2022 CLINICAL DATA:  Stroke. Right-sided tingling. Dizziness. Fall and LOC today. EXAM: CT ANGIOGRAPHY HEAD AND NECK TECHNIQUE: Multidetector CT imaging of the head and neck was performed using the standard protocol during bolus administration of intravenous contrast. Multiplanar CT image reconstructions and MIPs were obtained to evaluate the vascular anatomy. Carotid stenosis measurements (when applicable) are obtained utilizing NASCET criteria, using the distal internal carotid diameter as the denominator. RADIATION DOSE REDUCTION: This exam was performed according to the departmental dose-optimization program which includes automated exposure control, adjustment of the mA and/or kV according to patient size and/or use of iterative reconstruction technique. CONTRAST:  31m OMNIPAQUE IOHEXOL 350 MG/ML SOLN COMPARISON:  CT head and MRI head 01/16/2022 FINDINGS: CTA NECK FINDINGS Aortic arch: Atherosclerotic calcification in the aortic arch. Proximal great vessels patent without significant stenosis. Right carotid  system: Mild atherosclerotic disease right carotid bifurcation. No significant stenosis. Left carotid system: Mild atherosclerotic disease left carotid bifurcation. No significant stenosis. Vertebral arteries: Both vertebral arteries patent to the skull base without stenosis.  Skeleton: Mild degenerative change in the cervical spine. No acute skeletal abnormality. Other neck: Negative for mass or adenopathy. Subcentimeter thyroid nodules bilaterally. No further imaging necessary. (Ref: J Am Coll Radiol. 2015 Feb;12(2): 143-50). Upper chest: Lung apices clear bilaterally. Review of the MIP images confirms the above findings CTA HEAD FINDINGS Anterior circulation: Atherosclerotic calcification in the cavernous carotid bilaterally with mild stenosis bilaterally. Anterior and middle cerebral arteries widely patent without stenosis or large vessel occlusion Posterior circulation: Mild atherosclerotic disease distal left vertebral artery. No significant vertebral stenosis. Basilar widely patent. PICA patent bilaterally. Superior cerebellar and posterior cerebral arteries patent bilaterally. Mild stenosis right P2 segment. Venous sinuses: Normal venous enhancement Anatomic variants: None Review of the MIP images confirms the above findings IMPRESSION: 1. Negative for intracranial large vessel occlusion or flow limiting stenosis. 2. Mild stenosis right P2 segment. Mild atherosclerotic stenosis in the cavernous carotid bilaterally 3. No significant carotid or vertebral artery stenosis in the neck. Mild atherosclerotic disease of the carotid bifurcation bilaterally. Electronically Signed   By: Franchot Gallo M.D.   On: 01/16/2022 19:15   MR BRAIN WO CONTRAST  Result Date: 01/16/2022 CLINICAL DATA:  Weakness and slurred speech.  Stroke EXAM: MRI HEAD WITHOUT CONTRAST TECHNIQUE: Multiplanar, multiecho pulse sequences of the brain and surrounding structures were obtained without intravenous contrast. COMPARISON:  CT head  01/16/2022 FINDINGS: Brain: Negative for acute infarct. Mild chronic microvascular ischemic changes. Small chronic infarcts in the white matter. Small chronic lacunar infarction in the left thalamus and right external capsule. Small infarct right inferior cerebellum. Chronic microhemorrhage left temporoparietal lobe. Ventricle size normal.  No mass lesion. Vascular: Normal arterial flow voids. Skull and upper cervical spine: No focal skeletal lesion. Sinuses/Orbits: Mucosal edema paranasal sinuses. No orbital lesion. Right cataract extraction Other: None IMPRESSION: Negative for acute infarct.  Mild chronic ischemic change. Electronically Signed   By: Franchot Gallo M.D.   On: 01/16/2022 19:02   DG CHEST PORT 1 VIEW  Result Date: 01/16/2022 CLINICAL DATA:  Shortness of breath EXAM: PORTABLE CHEST 1 VIEW COMPARISON:  Radiograph done on 12/11/2018, CT chest done on 02/09/2021 FINDINGS: Transverse diameter of heart is increased. Thoracic aorta is tortuous and ectatic. Lung fields are clear of any infiltrates or pulmonary edema. There is no pleural effusion or pneumothorax. IMPRESSION: There are no focal infiltrates or signs of pulmonary edema. Electronically Signed   By: Elmer Picker M.D.   On: 01/16/2022 20:09   CT HEAD CODE STROKE WO CONTRAST  Result Date: 01/16/2022 CLINICAL DATA:  Neuro deficit, acute, stroke suspected. Additional history provided: Right-sided tingling, dizziness with fall and loss of consciousness today. EXAM: CT HEAD WITHOUT CONTRAST TECHNIQUE: Contiguous axial images were obtained from the base of the skull through the vertex without intravenous contrast. RADIATION DOSE REDUCTION: This exam was performed according to the departmental dose-optimization program which includes automated exposure control, adjustment of the mA and/or kV according to patient size and/or use of iterative reconstruction technique. COMPARISON:  BRAIN MRI 08/22/2021. HEAD CT 06/10/2019. FINDINGS: Brain:  Mild generalized cerebral atrophy. Known chronic lacunar infarcts within the left corona radiata and left thalamus. Known subcentimeter chronic infarct within the right cerebellar hemisphere. Some of these infarcts were better appreciated on the prior brain MRI of 08/22/2021. There is no acute intracranial hemorrhage. No demarcated cortical infarct. No extra-axial fluid collection. No evidence of an intracranial mass. No midline shift. Vascular: No hyperdense vessel.  Atherosclerotic calcifications. Skull: Normal. Negative for fracture or focal lesion. Sinuses/Orbits: Visualized orbits  show no acute finding. Mild mucosal thickening and fluid within the bilateral ethmoid sinuses. Trace mucosal thickening within the right sphenoid sinus. Trace mucosal thickening, and small volume frothy secretions, within the left sphenoid sinus. Mild mucosal thickening within the bilateral maxillary sinuses. The left maxillary sinus is asymmetrically small. ASPECTS [facility] Stroke Program Early CT Score) - Ganglionic level infarction (caudate, lentiform nuclei, internal capsule, insula, M1-M3 cortex): 7 - Supraganglionic infarction (M4-M6 cortex): 3 Total score (0-10 with 10 being normal): 10 Other: Right mastoid effusion. These results were called by telephone at the time of interpretation on 01/16/2022 at 5:42 pm to provider Dr. Regenia Skeeter, who verbally acknowledged these results. IMPRESSION: No evidence of acute intracranial abnormality. Known chronic infarcts within the left corona radiata, left thalamus and right cerebellar hemisphere. Mild generalized cerebral atrophy. Paranasal sinus disease, as described. Right mastoid effusion. Electronically Signed   By: Kellie Simmering D.O.   On: 01/16/2022 17:44    Procedures Procedures    Medications Ordered in ED Medications  atorvastatin (LIPITOR) tablet 80 mg (has no administration in time range)  ezetimibe (ZETIA) tablet 10 mg (has no administration in time range)  ALPRAZolam  (XANAX) tablet 0.25 mg (has no administration in time range)  famotidine (PEPCID) tablet 20 mg (has no administration in time range)  darifenacin (ENABLEX) 24 hr tablet 7.5 mg (has no administration in time range)  clopidogrel (PLAVIX) tablet 75 mg (has no administration in time range)  albuterol (PROVENTIL) (2.5 MG/3ML) 0.083% nebulizer solution 3 mL (has no administration in time range)  fluticasone furoate-vilanterol (BREO ELLIPTA) 100-25 MCG/ACT 1 puff (has no administration in time range)  loratadine (CLARITIN) tablet 10 mg (has no administration in time range)  acetaminophen (TYLENOL) tablet 650 mg (has no administration in time range)    Or  acetaminophen (TYLENOL) 160 MG/5ML solution 650 mg (has no administration in time range)    Or  acetaminophen (TYLENOL) suppository 650 mg (has no administration in time range)  senna-docusate (Senokot-S) tablet 1 tablet (has no administration in time range)  heparin injection 5,000 Units (has no administration in time range)  ondansetron (ZOFRAN) injection 4 mg (has no administration in time range)  melatonin tablet 6 mg (6 mg Oral Patient Refused/Not Given 01/16/22 2220)  insulin aspart (novoLOG) injection 0-15 Units (has no administration in time range)  insulin aspart (novoLOG) injection 0-5 Units (0 Units Subcutaneous Patient Refused/Not Given 01/16/22 2210)  aspirin chewable tablet 81 mg (81 mg Oral Given 01/16/22 1942)  iohexol (OMNIPAQUE) 350 MG/ML injection 100 mL (75 mLs Intravenous Contrast Given 01/16/22 1903)   stroke: mapping our early stages of recovery book ( Does not apply Given 01/16/22 2220)  sodium chloride 0.9 % bolus 500 mL (0 mLs Intravenous Stopped 01/16/22 2316)    ED Course/ Medical Decision Making/ A&P                           Medical Decision Making 71 year old female who presents to the ED today via EMS for syncopal episode however reports that just prior to syncope she began feeling tingling to the right side of her  face, arm, leg.  History of stroke with similar symptoms in the past.  Last known normal 2:50 PM.  Currently within stroke window.  She is noted to have weakness to her right side with decreased subjective sensation as well as pronator drift.  Code stroke activated at this time.  Concern for possible brain bleed as well given  syncope on Plavix.  Pending neurology recommendations at this time.   Teleneurologist has evaluated patient.  Recommends CTAs, MRI, admission for stroke work-up. Please see consult note. Aspirin provided.   MRI incidentally negative for stroke however given age and syncope with unknown cause do feel patient requires admission regardless.  Amount and/or Complexity of Data Reviewed Labs: ordered.    Details: CBC without leukocytosis. Hgb stable at 13.8 CMP with glucose 413. Bicarb WNL at 22. Creatinine 1.09.  ETOH negative COVID and flu neg U/A without signs of infection  UDS neg Radiology: ordered.    Details: CT head negative for bleed  CTA head and neck and MRI negative ECG/medicine tests: ordered. Discussion of management or test interpretation with external provider(s): Discussed case with [facility] hospitalist Dr. Clearence Ped who agrees to accept patient for [place].   Risk OTC drugs. Prescription drug management. Decision regarding hospitalization.          Final Clinical Impression(s) / ED Diagnoses Final diagnoses:  Syncope and collapse  Stroke-like symptoms    Rx / DC Orders ED Discharge Orders     None         Eustaquio Maize, PA-C 01/16/22 2320    Sherwood Gambler, MD 01/17/22 2255

## 2022-01-16 NOTE — Progress Notes (Signed)
Patient refusing to take insulin. Per patient her dad was at [facility] in [place] in 2004 and he was up walking around doing fine and was supposed to come home the next day, however; they gave him an insulin shot and he went into a diabetic coma and died. Due to this, this is why the patient is refusing insulin during her entire hospital stay. MD Zierle-Ghosh notified.

## 2022-01-16 NOTE — Assessment & Plan Note (Addendum)
Continue pepcid

## 2022-01-16 NOTE — Progress Notes (Signed)
Acute Office Visit  Subjective:    Patient ID: Yvonne [name], female    DOB: Sep 26, 1951, 71 y.o.   MRN: [id number]  No chief complaint on file.   HPI  Past Medical History:  Diagnosis Date   Anxiety    Arthritis    left hand   Asthma    daily and prn inhalers   Chronic cough    Chronic otitis media 12/2017   Full dentures    GERD (gastroesophageal reflux disease)    History of stroke 09/2017   weakness right hand, numbness right side face   Hyperlipidemia    Hypertension    states under control with meds., has been on med. x a long time, per pt.   Non-insulin dependent type 2 diabetes mellitus (HCC)    Overactive bladder    Recurrent acute suppurative otitis media without spontaneous rupture of left tympanic membrane 02/19/2018   Transient cerebral ischemia     Past Surgical History:  Procedure Laterality Date   ABDOMINAL HYSTERECTOMY     complete   CATARACT EXTRACTION W/ INTRAOCULAR LENS IMPLANT Right    CHOLECYSTECTOMY     COLONOSCOPY N/A 08/17/2015   Procedure: COLONOSCOPY;  Surgeon: Rogene Houston, MD;  Location: [facility];  Service: Endoscopy;  Laterality: N/A;  200 - moved to 7:30 - Ann notified pt   GAS INSERTION Right    x 2 - eye   LACRIMAL DUCT EXPLORATION Bilateral    removal of tear ducts   MYRINGOTOMY WITH TUBE PLACEMENT Bilateral 01/28/2018   Procedure: BILATERAL MYRINGOTOMY WITH TUBE PLACEMENT;  Surgeon: Leta Baptist, MD;  Location: [facility];  Service: ENT;  Laterality: Bilateral;   VITRECTOMY Left 09/14/2015    Family History  Problem Relation Age of Onset   Arthritis Mother    Diabetes Mother    CVA Mother    Stroke Mother    Diabetes Father    Heart disease Father        CABG.  Does not know age of onset   Asthma Father    Hepatitis C Sister    Arthritis Brother    Cancer Brother        metastic cancer   Peripheral Artery Disease Daughter    Arthritis-Osteo Son     Social History   Socioeconomic History   Marital  status: Married    Spouse name: Not on file   Number of children: 3   Years of education: Not on file   Highest education level: GED or equivalent  Occupational History   Occupation: retired    Comment: Customer service manager and restaurants  Tobacco Use   Smoking status: Never   Smokeless tobacco: Never  Scientific laboratory technician Use: Never used  Substance and Sexual Activity   Alcohol use: No   Drug use: No   Sexual activity: Yes  Other Topics Concern   Not on file  Social History Narrative   Lives at home home with husband with son, daughter in law and 3 children.  [name], his girlfriend and her child also moved in 10/2018      Disabled.   Social Determinants of Health   Financial Resource Strain: Low Risk    Difficulty of Paying Living Expenses: Not hard at all  Food Insecurity: No Food Insecurity   Worried About Charity fundraiser in the Last Year: Never true   Ran Out of Food in the Last Year: Never true  Transportation Needs: No Data processing manager (Medical): No   Lack of Transportation (Non-Medical): No  Physical Activity: Sufficiently Active   Days of Exercise per Week: 7 days   Minutes of Exercise per Session: 50 min  Stress: Stress Concern Present   Feeling of Stress : Rather much  Social Connections: Moderately Isolated   Frequency of Communication with Friends and Family: More than three times a week   Frequency of Social Gatherings with Friends and Family: More than three times a week   Attends Religious Services: Never   Marine scientist or Organizations: No   Attends Music therapist: Never   Marital Status: Married  Human resources officer Violence: Not At Risk   Fear of Current or Ex-Partner: No   Emotionally Abused: No   Physically Abused: No   Sexually Abused: No    Outpatient Medications Prior to Visit  Medication Sig Dispense Refill   Accu-Chek Softclix Lancets lancets Test BS up to 4 times daily Dx E11.69 400  each 3   albuterol (VENTOLIN HFA) 108 (90 Base) MCG/ACT inhaler TAKE 2 PUFFS BY MOUTH EVERY 6 HOURS AS NEEDED FOR WHEEZE OR SHORTNESS OF BREATH 54 each 0   alendronate (FOSAMAX) 70 MG tablet Take 1 tablet (70 mg total) by mouth every 7 (seven) days. Take with a full glass of water on an empty stomach. 13 tablet 3   ALPRAZolam (XANAX) 1 MG tablet ! Each morning, one each evening, and 1/2 each afternoon 75 tablet 5   amLODipine (NORVASC) 10 MG tablet TAKE 1 TABLET BY MOUTH EVERY DAY 90 tablet 3   atorvastatin (LIPITOR) 80 MG tablet Take 1 tablet (80 mg total) by mouth daily. 90 tablet 2   Blood Glucose Monitoring Suppl (ACCU-CHEK GUIDE) w/Device KIT Test Bs QID Dx E11.69 1 kit 0   clopidogrel (PLAVIX) 75 MG tablet TAKE 1 TABLET BY MOUTH EVERY DAY WITH BREAKFAST 90 tablet 3   dapagliflozin propanediol (FARXIGA) 10 MG TABS tablet Take 1 tablet (10 mg total) by mouth daily. 90 tablet 4   ezetimibe (ZETIA) 10 MG tablet TAKE 1 TABLET BY MOUTH DAILY. FOR CHOLESTEROL 90 tablet 3   famotidine (PEPCID) 20 MG tablet famotidine 20 mg tablet  TAKE 1 TABLET BY MOUTH AT BEDTIME     fluticasone furoate-vilanterol (BREO ELLIPTA) 100-25 MCG/INH AEPB Inhale 1 puff into the lungs daily. 180 each 1   furosemide (LASIX) 20 MG tablet Take 1 tablet (20 mg total) by mouth daily. 90 tablet 1   glipiZIDE (GLUCOTROL) 5 MG tablet Take 1 tablet (5 mg total) by mouth daily before breakfast. 90 tablet 3   glucose blood (ACCU-CHEK GUIDE) test strip TEST BLOOD SUGAR 4 TIMES DAILY Dx E11.69 400 strip 3   guaiFENesin-codeine (CHERATUSSIN AC) 100-10 MG/5ML syrup Take 5 mLs by mouth every 4 (four) hours as needed for cough. 180 mL 0   loratadine (CLARITIN) 10 MG tablet Take 1 tablet (10 mg total) by mouth daily. 90 tablet 3   meloxicam (MOBIC) 7.5 MG tablet Take 1 tablet by mouth 2 (two) times daily as needed.     nitroGLYCERIN (NITROSTAT) 0.4 MG SL tablet Place 1 tablet (0.4 mg total) under the tongue every 5 (five) minutes as needed  for chest pain. 25 tablet 10   ofloxacin (FLOXIN) 0.3 % OTIC solution ofloxacin 0.3 % ear drops  INSTILL 5 DROPS IN EACH EAR TWICE A DAY AS NEEDED FOR EAR PAIN     Semaglutide,  2 MG/DOSE, (OZEMPIC, 2 MG/DOSE,) 8 MG/3ML SOPN Inject 2 mg into the skin once a week. 9 mL 3   solifenacin (VESICARE) 10 MG tablet Take 10 mg by mouth daily.     No facility-administered medications prior to visit.    Allergies  Allergen Reactions   Penicillins Hives and Itching    Has patient had a PCN reaction causing immediate rash, facial/tongue/throat swelling, SOB or lightheadedness with hypotension: Yes Has patient had a PCN reaction causing severe rash involving mucus membranes or skin necrosis: Unk Has patient had a PCN reaction that required hospitalization: No Has patient had a PCN reaction occurring within the last 10 years: No If all of the above answers are "NO", then may proceed with Cephalosporin use.  Has patient had a PCN reaction causing immediate rash, facial/tongue/throat swelling, SOB or lightheadedness with hypotension: Yes Has patient had a PCN reaction causing severe rash involving mucus membranes or skin necrosis: No Has patient had a PCN reaction that required hospitalization No Has patient had a PCN reaction occurring within the last 10 years: No If all of the above answers are "NO", then may proceed with Cephalosporin use. Has patient had a PCN reaction causing immediate rash, facial/tongue/throat swelling, SOB or lightheadedness with hypotension: Yes Has patient had a PCN reaction causing severe rash involving mucus membranes or skin necrosis: Unk Has patient had a PCN reaction that required hospitalization: No Has patient had a PCN reaction occurring within the last 10 years: No If all of the above answers are "NO", then may proceed with Cephalosporin use. Has patient had a PCN reaction causing immediate rash, facial/tongue/throat swelling, SOB or lightheadedness with hypotension:  Yes Has patient had a PCN reaction causing severe rash involving mucus membranes or skin necrosis: No Has patient had a PCN reaction that required hospitalization No Has patient had a PCN reaction occurring within the last 10 years: No If all of the above answers are "NO", then may proceed with Cephalosporin use. Has patient had a PCN reaction causing immediate rash, facial/tongue/throat swelling, SOB or lightheadedness with hypotension: Yes Has patient had a PCN reaction causing sev... (TRUNCATED)   Gabapentin Other (See Comments)    Causes a lot of lethargy at a higher dose Causes a lot of lethargy at a higher dose Causes a lot of lethargy at a higher dose   Lisinopril Cough    HAS A CHRONIC COUGH AND DID NOT NOTICE IMPROVEMENT OF COUGH WHEN LISINOPRIL WAS STOPPED HAS A CHRONIC COUGH AND DID NOT NOTICE IMPROVEMENT OF COUGH WHEN LISINOPRIL WAS STOPPED HAS A CHRONIC COUGH AND DID NOT NOTICE IMPROVEMENT OF COUGH WHEN LISINOPRIL WAS STOPPED   Soap Rash    DIAL soap    Review of Systems     Objective:    Physical Exam  There were no vitals taken for this visit. Wt Readings from Last 3 Encounters:  12/13/21 177 lb (80.3 kg)  12/07/21 172 lb (78 kg)  11/30/21 179 lb 6 oz (81.4 kg)    Health Maintenance Due  Topic Date Due   OPHTHALMOLOGY EXAM  08/26/2016   COLON CANCER SCREENING ANNUAL FOBT  12/04/2019    There are no preventive care reminders to display for this patient.   Lab Results  Component Value Date   TSH 3.040 06/20/2018   Lab Results  Component Value Date   WBC 6.2 11/30/2021   HGB 13.8 11/30/2021   HCT 41.1 11/30/2021   MCV 87 11/30/2021   PLT 288 11/30/2021  Lab Results  °Component Value Date  ° NA 141 12/19/2021  ° K 3.8 12/19/2021  ° CO2 24 12/19/2021  ° GLUCOSE 163 (H) 12/19/2021  ° BUN 11 12/19/2021  ° CREATININE 0.81 12/19/2021  ° BILITOT 0.5 12/19/2021  ° ALKPHOS 129 (H) 12/19/2021  ° AST 19 12/19/2021  ° ALT 14 12/19/2021  ° PROT 6.5 12/19/2021  °  ALBUMIN 4.2 12/19/2021  ° CALCIUM 9.3 12/19/2021  ° ANIONGAP 8 06/10/2019  ° EGFR 78 12/19/2021  ° °Lab Results  °Component Value Date  ° CHOL 147 11/30/2021  ° °Lab Results  °Component Value Date  ° HDL 49 11/30/2021  ° °Lab Results  °Component Value Date  ° LDLCALC 66 11/30/2021  ° °Lab Results  °Component Value Date  ° TRIG 197 (H) 11/30/2021  ° °Lab Results  °Component Value Date  ° CHOLHDL 3.0 11/30/2021  ° °Lab Results  °Component Value Date  ° HGBA1C 7.8 (H) 11/30/2021  ° ° °   °Assessment & Plan:  ° °Problem List Items Addressed This Visit   °None ° ° ° °No orders of the defined types were placed in this encounter. ° ° ° ° M , NP ° °

## 2022-01-17 ENCOUNTER — Observation Stay (HOSPITAL_BASED_OUTPATIENT_CLINIC_OR_DEPARTMENT_OTHER): Payer: Medicare HMO

## 2022-01-17 ENCOUNTER — Observation Stay (HOSPITAL_COMMUNITY)
Admit: 2022-01-17 | Discharge: 2022-01-17 | Disposition: A | Discharge status: Home / Self Care | Payer: Medicare HMO | Attending: Neurology | Admitting: Neurology

## 2022-01-17 DIAGNOSIS — E1165 Type 2 diabetes mellitus with hyperglycemia: Secondary | ICD-10-CM | POA: Diagnosis not present

## 2022-01-17 DIAGNOSIS — G459 Transient cerebral ischemic attack, unspecified: Secondary | ICD-10-CM

## 2022-01-17 DIAGNOSIS — E782 Mixed hyperlipidemia: Secondary | ICD-10-CM | POA: Diagnosis not present

## 2022-01-17 DIAGNOSIS — R2990 Unspecified symptoms and signs involving the nervous system: Secondary | ICD-10-CM | POA: Diagnosis not present

## 2022-01-17 DIAGNOSIS — R209 Unspecified disturbances of skin sensation: Secondary | ICD-10-CM

## 2022-01-17 DIAGNOSIS — R55 Syncope and collapse: Secondary | ICD-10-CM | POA: Diagnosis not present

## 2022-01-17 DIAGNOSIS — N1831 Chronic kidney disease, stage 3a: Secondary | ICD-10-CM

## 2022-01-17 DIAGNOSIS — F411 Generalized anxiety disorder: Secondary | ICD-10-CM | POA: Diagnosis not present

## 2022-01-17 LAB — ECHOCARDIOGRAM COMPLETE
AR max vel: 2.83 cm2
AV Area VTI: 2.63 cm2
AV Area mean vel: 2.5 cm2
AV Mean grad: 3 mmHg
AV Peak grad: 4.2 mmHg
Ao pk vel: 1.02 m/s
Area-P 1/2: 2.94 cm2
Calc EF: 71.6 %
Height: 62 in
MV VTI: 2.19 cm2
S' Lateral: 2.6 cm
Single Plane A2C EF: 67.3 %
Single Plane A4C EF: 73.8 %
Weight: 2892.44 oz

## 2022-01-17 LAB — LIPID PANEL
Cholesterol: 206 mg/dL — ABNORMAL HIGH (ref 0–200)
HDL: 63 mg/dL (ref >40)
LDL Cholesterol: 132 mg/dL — ABNORMAL HIGH (ref 0–99)
Total CHOL/HDL Ratio: 3.3 RATIO
Triglycerides: 57 mg/dL (ref <150)
VLDL: 11 mg/dL (ref 0–40)

## 2022-01-17 LAB — COMPREHENSIVE METABOLIC PANEL
ALT: 29 U/L (ref 0–44)
AST: 26 U/L (ref 15–41)
Albumin: 3.6 g/dL (ref 3.5–5.0)
Alkaline Phosphatase: 107 U/L (ref 38–126)
Anion gap: 6 (ref 5–15)
BUN: 19 mg/dL (ref 8–23)
CO2: 25 mmol/L (ref 22–32)
Calcium: 9.2 mg/dL (ref 8.9–10.3)
Chloride: 106 mmol/L (ref 98–111)
Creatinine, Ser: 0.69 mg/dL (ref 0.44–1.00)
GFR, Estimated: >60 mL/min (ref >60)
Glucose, Bld: 243 mg/dL — ABNORMAL HIGH (ref 70–99)
Potassium: 3.5 mmol/L (ref 3.5–5.1)
Sodium: 137 mmol/L (ref 135–145)
Total Bilirubin: 0.6 mg/dL (ref 0.3–1.2)
Total Protein: 6.9 g/dL (ref 6.5–8.1)

## 2022-01-17 LAB — HEMOGLOBIN A1C
Hgb A1c MFr Bld: 7.9 % — ABNORMAL HIGH (ref 4.8–5.6)
Mean Plasma Glucose: 180 mg/dL

## 2022-01-17 LAB — GLUCOSE, CAPILLARY
Glucose-Capillary: 194 mg/dL — ABNORMAL HIGH (ref 70–99)
Glucose-Capillary: 231 mg/dL — ABNORMAL HIGH (ref 70–99)
Glucose-Capillary: 262 mg/dL — ABNORMAL HIGH (ref 70–99)
Glucose-Capillary: 264 mg/dL — ABNORMAL HIGH (ref 70–99)
Glucose-Capillary: 308 mg/dL — ABNORMAL HIGH (ref 70–99)
Glucose-Capillary: 340 mg/dL — ABNORMAL HIGH (ref 70–99)

## 2022-01-17 LAB — TSH: TSH: 0.766 u[IU]/mL (ref 0.350–4.500)

## 2022-01-17 LAB — VITAMIN B12: Vitamin B-12: 208 pg/mL (ref 180–914)

## 2022-01-17 LAB — T4, FREE: Free T4: 0.83 ng/dL (ref 0.61–1.12)

## 2022-01-17 LAB — FOLATE: Folate: 11.8 ng/mL (ref >5.9)

## 2022-01-17 MED ORDER — ALPRAZOLAM 0.5 MG PO TABS
0.5000 mg | ORAL_TABLET | Freq: Two times a day (BID) | ORAL | Status: DC
  Administered 2022-01-17 – 2022-01-18 (×3): 0.5 mg via ORAL
  Filled 2022-01-17 (×3): qty 1

## 2022-01-17 MED ORDER — ASPIRIN 81 MG PO CHEW
81.0000 mg | CHEWABLE_TABLET | Freq: Every day | ORAL | Status: DC
  Administered 2022-01-17 – 2022-01-18 (×2): 81 mg via ORAL
  Filled 2022-01-17 (×2): qty 1

## 2022-01-17 MED ORDER — VITAMIN B-12 100 MCG PO TABS
500.0000 ug | ORAL_TABLET | Freq: Every day | ORAL | Status: DC
  Administered 2022-01-17 – 2022-01-18 (×2): 500 ug via ORAL
  Filled 2022-01-17 (×2): qty 5

## 2022-01-17 MED ORDER — POTASSIUM CHLORIDE IN NACL 20-0.9 MEQ/L-% IV SOLN
INTRAVENOUS | Status: AC
Start: 2022-01-17 — End: 2022-01-17

## 2022-01-17 NOTE — Progress Notes (Signed)
Inpatient Diabetes Program Recommendations  AACE/ADA: New Consensus Statement on Inpatient Glycemic Control  Target Ranges:  Prepandial:   less than 140 mg/dL      Peak postprandial:   less than 180 mg/dL (1-2 hours)      Critically ill patients:  140 - 180 mg/dL    Latest Reference Range & Units 01/16/22 21:52 01/17/22 00:15 01/17/22 05:19  Glucose-Capillary 70 - 99 mg/dL 286 (H) 340 (H) 194 (H)    Latest Reference Range & Units 11/30/21 14:43  HB A1C (BAYER DCA - WAIVED) 4.8 - 5.6 % 7.8 (H)   Review of Glycemic Control  Diabetes history: DM2 Outpatient Diabetes medications: Farxiga 10 mg QAM, Glipizide 5 mg QAM, Ozempic 2 mg Qweek Current orders for Inpatient glycemic control: Novolog 0-15 units TID with meals, Novolog 0-5 units QHS  Inpatient Diabetes Program Recommendations:    Oral DM medications: Since patient is refusing insulin as inpatient, may want to consider resuming outpatient oral DM medications while inpatient.  NOTE: Per note by Baird Cancer, LPN on 05/21/93, "Patient refusing to take insulin. Per patient her dad was at [facility] in [place] in 2004 and he was up walking around doing fine and was supposed to come home the next day, however; they gave him an insulin shot and he went into a diabetic coma and died. Due to this, this is why the patient is refusing insulin during her entire hospital stay."  Thanks, Barnie Alderman, RN, MSN, CDE Diabetes Coordinator Inpatient Diabetes Program [phone] (Team Pager from 8am to 5pm)

## 2022-01-17 NOTE — Progress Notes (Signed)
EEG complete - results pending 

## 2022-01-17 NOTE — Assessment & Plan Note (Addendum)
Neurology consult appreciated Due to TIA and component of diabetic polyneuropathy MRI brain negative for acute findings CTA head and neck--negative LVO, patent carotids and vertebrals; mild R- P2 stenosis Continue Plavix Add ASA 81 mg x 90 days with plavix, then back to plavix monotherapy PT evaluation--no follow up needed Hemoglobin A1c--7.9 LDL 132 Echocardiogram Check TSH Serum G81--661 Folic acid 11.8

## 2022-01-17 NOTE — Progress Notes (Signed)
*  PRELIMINARY RESULTS* Echocardiogram 2D Echocardiogram has been performed.  Elpidio Anis 01/17/2022, 4:10 PM

## 2022-01-17 NOTE — Evaluation (Signed)
Occupational Therapy Evaluation Patient Details Name: Yvonne [name] MRN: [id number] DOB: 1951/10/30 Today's Date: 01/17/2022   History of Present Illness Yvonne [name] is a 71 y.o. female with history of anxiety, GERD, hyperlipidemia, hypertension, type 2 diabetes mellitus, history of TIA, and history of stroke presents ED with chief complaint of syncope.  Patient reports that she takes care of 2 young grandchildren.  She just got home from school, and she was feeling exhausted.  She started to have paresthesias in her face that then moved to her arm and leg, so she told her grandson she was going to go lay down.  She was not getting up from sitting down, but already walking around, when she passed out.  She does not think she was out very long.  Her grandson called 911.  Patient reports that she did hit her head on the floor.  She has had a headache for couple of days, and the headache continues still.  It is mild.  Patient reports that she had right-sided face, arm, leg weakness when she woke up.  She does remember feeling very dizzy before she passed out.  She did not have chest pain.  She does admit to palpitations.  Patient reports that she has been having palpitations on and off for a week, they are associated with exertion.  Patient reports she was almost immediately oriented when she came to.  She has no personal history of seizure.  She reports at 1 point her neurologist had previous TIAs may have been seizures, but she was never started on medication for seizure.  Patient does report her grandson has seizures.  Patient reports that she takes Xanax 1 mg in the morning half a milligram at lunch and 1 mg every night.  She had missed 2 doses; the morning and lunch dose today, because she was running late.  Patient also reports that her stress levels have been very high.  Her daughter is currently admitted at stroke, hospital for stroke, and her son recently had an amputation of his lower extremity and then  osteomyelitis, and then revision of the amputation, and is now on antibiotics with a PICC line at home.  Patient has no other complaints at this time.   Clinical Impression   Pt agreeable to OT and PT co-evaluation. Pt able to complete donning of socks independently as well as transfer to chair and ambulation in and out of room without AD. Pt reports feeling back to baseline levels. Pt is not recommended for further acute OT services and will be discharged to care of nursing staff for remaining length of stay.       Recommendations for follow up therapy are one component of a multi-disciplinary discharge planning process, led by the attending physician.  Recommendations may be updated based on patient status, additional functional criteria and insurance authorization.   Follow Up Recommendations  No OT follow up    Assistance Recommended at Discharge None        Functional Status Assessment  Patient has not had a recent decline in their functional status  Equipment Recommendations  None recommended by OT    Recommendations for Other Services       Precautions / Restrictions Precautions Precautions: Fall Restrictions Weight Bearing Restrictions: No      Mobility Bed Mobility Overal bed mobility: Independent                  Transfers Overall transfer level: Independent  Balance Overall balance assessment: Independent                                         ADL either performed or assessed with clinical judgement   ADL Overall ADL's : Independent                                             Vision Baseline Vision/History: 1 Wears glasses (reading) Ability to See in Adequate Light: 1 Impaired Patient Visual Report: No change from baseline Vision Assessment?: No apparent visual deficits                Pertinent Vitals/Pain Pain Assessment Pain Assessment: No/denies pain     Hand  Dominance Right   Extremity/Trunk Assessment Upper Extremity Assessment Upper Extremity Assessment: Overall WFL for tasks assessed (Pt reports symptoms have resolved. R hand and face have numbness and tingling at baseline from previous stroke per pt report.)   Lower Extremity Assessment Lower Extremity Assessment: Defer to PT evaluation   Cervical / Trunk Assessment Cervical / Trunk Assessment: Normal   Communication Communication Communication: No difficulties   Cognition Arousal/Alertness: Awake/alert Behavior During Therapy: WFL for tasks assessed/performed Overall Cognitive Status: Within Functional Limits for tasks assessed                                                        Home Living Family/patient expects to be discharged to:: Private residence Living Arrangements: Spouse/significant other Available Help at Discharge: Family;Available 24 hours/day Type of Home: House Home Access: Stairs to enter [employer] of Steps: 7 Entrance Stairs-Rails: Can reach both;Right;Left Home Layout: One level;Other (Comment) (1 to 2 steps within home for entrance into certain rooms within the house.)     Bathroom Shower/Tub: Tub/shower unit   [employer] Toilet: Handicapped height Bathroom Accessibility: Yes How Accessible: Accessible via walker Home Equipment: None          Prior Functioning/Environment Prior Level of Function : Independent/Modified Independent             Mobility Comments: Ambulation without AD ADLs Comments: independent                      OT Goals(Current goals can be found in the care plan section) Acute Rehab OT Goals Patient Stated Goal: return home  OT Frequency:      Co-evaluation PT/OT/SLP Co-Evaluation/Treatment: Yes Reason for Co-Treatment: To address functional/ADL transfers   OT goals addressed during session: ADL's and self-care      AM-PAC OT "6 Clicks" Daily Activity     Outcome Measure  Help from another person eating meals?: None Help from another person taking care of personal grooming?: None Help from another person toileting, which includes using toliet, bedpan, or urinal?: None Help from another person bathing (including washing, rinsing, drying)?: None Help from another person to put on and taking off regular upper body clothing?: None Help from another person to put on and taking off regular lower body clothing?: None 6 Click Score: 24   End of Session    Activity Tolerance: Patient  tolerated treatment well Patient left: in bed;with call bell/phone within reach  OT Visit Diagnosis: Other symptoms and signs involving the nervous system (R29.898);Unsteadiness on feet (R26.81);Other abnormalities of gait and mobility (R26.89);Dizziness and giddiness (R42)                Time: 0979-4997 OT Time Calculation (min): 10 min Charges:  OT General Charges $OT Visit: 1 Visit OT Evaluation $OT Eval Low Complexity: 1 Low  Norm Wray OT, MOT  Larey Seat 01/17/2022, 8:54 AM

## 2022-01-17 NOTE — Assessment & Plan Note (Addendum)
Holding glipizide Patient refuses insulin throughout hospitalization Patient is also on [place] and Ozempic at home 11/30/2021 hemoglobin A1c 7.8 01/17/22 A1C--7.9

## 2022-01-17 NOTE — Care Management Obs Status (Signed)
[facility] NOTIFICATION   Patient Details  Name: Yvonne [name] MRN: [id number] Date of Birth: 06/05/1951   Medicare Observation Status Notification Given:  Yes    Ihor Gully, [name] 01/17/2022, 4:39 PM

## 2022-01-17 NOTE — Progress Notes (Signed)
°  Transition of Care [facility]) Screening Note   Patient Details  Name: ALENA BLANKENBECKLER Date of Birth: 22-Mar-1951   Transition of Care [facility]) CM/SW Contact:    Ihor Gully, [name] Phone Number: 01/17/2022, 4:28 PM    Transition of Care Department [facility]) has reviewed patient and no TOC needs have been identified at this time. We will continue to monitor patient advancement through interdisciplinary progression rounds. If new patient transition needs arise, please place a TOC consult.   Kailer Heindel, Clydene Pugh, [name]

## 2022-01-17 NOTE — Hospital Course (Signed)
71 year old female with a history of hypertension, hyperlipidemia, diabetes mellitus type 2, stroke, anxiety presenting with a syncopal event.  The patient states that she was walking from her kitchen back to her bedroom when she was feeling dizzy and fell onto the floor.  Apparently, her grandson activated EMS.  The patient relates a 1 week history of intermittent numbness and tingling to her right arm and right leg with associated right upper extremity weakness.  She states that she had an episode on 01/15/2022.  She woke up on 01/16/2022 with similar symptoms albeit somewhat improved.  She went to urgent care on the morning of 01/16/2022 because of coughing, congestion, and sore throat.  She was given some type of antibiotic and a steroid injection.  The patient relates that she has had some difficulties with her medications.  She states that " I cannot tell what on taking and whether or not on taking it or not."  She states that there are multiple occasions where she misses her medications.  She denies any headache, neck pain chest pain, shortness of breath, hemoptysis, nausea, vomiting, diarrhea.  She has had a nonproductive cough.  She stated that she had a fever of 102 on the morning of 01/15/2022. In the ED, the patient was noted to have serum glucose of 413 and blood pressure 163/77.  BMP showed sodium 137, potassium 5.1, serum creatinine 0.90, serum glucose 413, WBC 5.4, hemoglobin 13.8, platelets 223,000.  CT of the brain was negative for acute findings but showed chronic infarcts in the left corona radiata, left thalamus, and right cerebellar hemisphere.  She was seen by telemetry neurology.  Because of concerns of a small vessel stroke, she was admitted for further stroke work-up.

## 2022-01-17 NOTE — Progress Notes (Signed)
PROGRESS NOTE  Yvonne [name] EQA:[id number] DOB: 11-23-1951 DOA: 01/16/2022 PCP: [name], MD  Brief History:  71 year old female with a history of hypertension, hyperlipidemia, diabetes mellitus type 2, stroke, anxiety presenting with a syncopal event.  The patient states that she was walking from her kitchen back to her bedroom when she was feeling dizzy and fell onto the floor.  Apparently, her grandson activated EMS.  The patient relates a 1 week history of intermittent numbness and tingling to her right arm and right leg with associated right upper extremity weakness.  She states that she had an episode on 01/15/2022.  She woke up on 01/16/2022 with similar symptoms albeit somewhat improved.  She went to urgent care on the morning of 01/16/2022 because of coughing, congestion, and sore throat.  She was given some type of antibiotic and a steroid injection.  The patient relates that she has had some difficulties with her medications.  She states that " I cannot tell what on taking and whether or not on taking it or not."  She states that there are multiple occasions where she misses her medications.  She denies any headache, neck pain chest pain, shortness of breath, hemoptysis, nausea, vomiting, diarrhea.  She has had a nonproductive cough.  She stated that she had a fever of 102 on the morning of 01/15/2022. In the ED, the patient was noted to have serum glucose of 413 and blood pressure 163/77.  BMP showed sodium 137, potassium 5.1, serum creatinine 0.90, serum glucose 413, WBC 5.4, hemoglobin 13.8, platelets 223,000.  CT of the brain was negative for acute findings but showed chronic infarcts in the left corona radiata, left thalamus, and right cerebellar hemisphere.  She was seen by telemetry neurology.  Because of concerns of a small vessel stroke, she was admitted for further stroke work-up.     Assessment and Plan: * Syncope and collapse- (present on admission) Personally  reviewed telemetry--no concerning dysrhythmia\ Clinical history suggestive of vasovagal/dysautonomia from her poorly controlled diabetes mellitus Check orthostatics Personally reviewed EKG--sinus rhythm, nonspecific T wave change Echocardiogram Personally reviewed chest x-ray--no infiltrates or edema  Sensory disturbance Neurology consult appreciated Suspect a component of diabetic polyneuropathy MRI brain negative for acute findings CTA head and neck--negative LVO, patent carotids and vertebrals; mild R- P2 stenosis Continue Plavix PT evaluation Hemoglobin A1c--pending LDL 132 Echocardiogram Check TSH Serum L79--892 Folic acid 11.9  Uncontrolled type 2 diabetes mellitus with hyperglycemia, without long-term current use of insulin (HCC) Holding glipizide Patient refuses insulin throughout hospitalization Patient is also on [place] and Ozempic at home 11/30/2021 hemoglobin A1c 7.8  HLD (hyperlipidemia)- (present on admission) LDL 132 Continue with statin Patient states that she has not been fully compliant at home  Acid reflux disease- (present on admission) Continue pepcid  GAD (generalized anxiety disorder)- (present on admission) PDMP reviewed Patient receives xanax 1 mg, #75 monthly  Hypertension- (present on admission) Permissive HTN per neuro Hold amlodipine Treat for systolic > 417         Status is: Observation The patient remains OBS appropriate and will d/c before 2 midnights.          Family Communication:   no Family at bedside  Consultants:  neurology  Code Status:  FULL /  DVT Prophylaxis:  [place] Heparin    Procedures: As Listed in Progress Note Above  Antibiotics: None      Subjective: Patient states that her numbness and weakness  on the right side has resolved.  She denies any fevers, chills, chest pain, breath, hemoptysis, nausea, vomiting, diarrhea, domino pain.  Objective: Vitals:   01/16/22 2340 01/17/22 0129 01/17/22  0338 01/17/22 0615  BP: (!) 149/73 (!) 146/58 (!) 156/74 (!) 144/73  Pulse: 83 75 72 65  Resp: 17 17 18 19   Temp: 97.7 F (36.5 C) 97.6 F (36.4 C) 97.8 F (36.6 C) 97.9 F (36.6 C)  TempSrc:  Oral Oral Oral  SpO2: 96% 92% 93% 93%  Weight:      Height:        Intake/Output Summary (Last 24 hours) at 01/17/2022 4970 Last data filed at 01/16/2022 2323 Gross per 24 hour  Intake --  Output 525 ml  Net -525 ml   Weight change:  Exam:  General:  Pt is alert, follows commands appropriately, not in acute distress HEENT: No icterus, No thrush, No neck mass, [place]/AT Cardiovascular: RRR, S1/S2, no rubs, no gallops Respiratory: Bibasilar rales.  No wheezing.  Good air movement Abdomen: Soft/+BS, non tender, non distended, no guarding Extremities: No edema, No lymphangitis, No petechiae, No rashes, no synovitis Neuro:  CN II-XII intact, strength 4/5 in RUE, RLE, strength 4/5 LUE, LLE; sensation intact bilateral; no dysmetria; babinski equivocal    Data Reviewed: I have personally reviewed following labs and imaging studies Basic Metabolic Panel: Recent Labs  Lab 01/16/22 1723 01/16/22 1727 01/17/22 0444  NA 136 137 137  K 3.5 5.1 3.5  CL 104 105 106  CO2 22  --  25  GLUCOSE 413* 413* 243*  BUN 20 25* 19  CREATININE 1.09* 0.90 0.69  CALCIUM 9.2  --  9.2   Liver Function Tests: Recent Labs  Lab 01/16/22 1723 01/17/22 0444  AST 35 26  ALT 29 29  ALKPHOS 117 107  BILITOT 0.4 0.6  PROT 7.3 6.9  ALBUMIN 4.0 3.6   No results for input(s): LIPASE, AMYLASE in the last 168 hours. No results for input(s): AMMONIA in the last 168 hours. Coagulation Profile: Recent Labs  Lab 01/16/22 1723  INR 0.9   CBC: Recent Labs  Lab 01/16/22 1723 01/16/22 1727  WBC 5.4  --   NEUTROABS 4.6  --   HGB 13.8 14.3  HCT 40.4 42.0  MCV 87.1  --   PLT 223  --    Cardiac Enzymes: No results for input(s): CKTOTAL, CKMB, CKMBINDEX, TROPONINI in the last 168 hours. BNP: Invalid  input(s): POCBNP CBG: Recent Labs  Lab 01/16/22 1720 01/16/22 2017 01/16/22 2152 01/17/22 0015 01/17/22 0519  GLUCAP 391* 272* 286* 340* 194*   HbA1C: No results for input(s): HGBA1C in the last 72 hours. Urine analysis:    Component Value Date/Time   COLORURINE STRAW (A) 01/16/2022 1947   APPEARANCEUR CLEAR 01/16/2022 1947   APPEARANCEUR Cloudy (A) 08/07/2021 1045   LABSPEC 1.031 (H) 01/16/2022 1947   PHURINE 6.0 01/16/2022 1947   GLUCOSEU >=500 (A) 01/16/2022 1947   HGBUR SMALL (A) 01/16/2022 1947   BILIRUBINUR NEGATIVE 01/16/2022 1947   BILIRUBINUR Negative 08/07/2021 [place] 01/16/2022 1947   PROTEINUR NEGATIVE 01/16/2022 1947   NITRITE POSITIVE (A) 01/16/2022 1947   LEUKOCYTESUR NEGATIVE 01/16/2022 1947   Sepsis Labs: @LABRCNTIP (procalcitonin:4,lacticidven:4) ) Recent Results (from the past 240 hour(s))  Resp Panel by RT-PCR (Flu A&B, Covid) Nasopharyngeal Swab     Status: None   Collection Time: 01/16/22  7:36 PM   Specimen: Nasopharyngeal Swab; Nasopharyngeal(NP) swabs in vial transport medium  Result  Value Ref Range Status   SARS Coronavirus 2 by RT PCR NEGATIVE NEGATIVE Final    Comment: (NOTE) SARS-CoV-2 target nucleic acids are NOT DETECTED.  The SARS-CoV-2 RNA is generally detectable in upper respiratory specimens during the acute phase of infection. The lowest concentration of SARS-CoV-2 viral copies this assay can detect is 138 copies/mL. A negative result does not preclude SARS-Cov-2 infection and should not be used as the sole basis for treatment or other patient management decisions. A negative result may occur with  improper specimen collection/handling, submission of specimen other than nasopharyngeal swab, presence of viral mutation(s) within the areas targeted by this assay, and inadequate number of viral copies(<138 copies/mL). A negative result must be combined with clinical observations, patient history, and  epidemiological information. The expected result is Negative.  Fact Sheet for Patients:  EntrepreneurPulse.com.au  Fact Sheet for Healthcare Providers:  IncredibleEmployment.be  This test is no t yet approved or cleared by the [place] FDA and  has been authorized for detection and/or diagnosis of SARS-CoV-2 by FDA under an Emergency Use Authorization (EUA). This EUA will remain  in effect (meaning this test can be used) for the duration of the COVID-19 declaration under Section 564(b)(1) of the Act, 21 U.S.C.section 360bbb-3(b)(1), unless the authorization is terminated  or revoked sooner.       Influenza A by PCR NEGATIVE NEGATIVE Final   Influenza B by PCR NEGATIVE NEGATIVE Final    Comment: (NOTE) The Xpert Xpress SARS-CoV-2/FLU/RSV plus assay is intended as an aid in the diagnosis of influenza from Nasopharyngeal swab specimens and should not be used as a sole basis for treatment. Nasal washings and aspirates are unacceptable for Xpert Xpress SARS-CoV-2/FLU/RSV testing.  Fact Sheet for Patients: EntrepreneurPulse.com.au  Fact Sheet for Healthcare Providers: IncredibleEmployment.be  This test is not yet approved or cleared by the [place] FDA and has been authorized for detection and/or diagnosis of SARS-CoV-2 by FDA under an Emergency Use Authorization (EUA). This EUA will remain in effect (meaning this test can be used) for the duration of the COVID-19 declaration under Section 564(b)(1) of the Act, 21 U.S.C. section 360bbb-3(b)(1), unless the authorization is terminated or revoked.  Performed at [facility], [address] [place], [place] [address]      Scheduled Meds:  ALPRAZolam  0.5 mg Oral BID   atorvastatin  80 mg Oral Daily   clopidogrel  75 mg Oral Daily   darifenacin  7.5 mg Oral Daily   ezetimibe  10 mg Oral Daily   famotidine  20 mg Oral Daily   fluticasone  furoate-vilanterol  1 puff Inhalation Daily   heparin  5,000 Units Subcutaneous Q8H   insulin aspart  0-15 Units Subcutaneous TID WC   insulin aspart  0-5 Units Subcutaneous QHS   loratadine  10 mg Oral Daily   melatonin  6 mg Oral QHS   vitamin B-12  500 mcg Oral Daily   Continuous Infusions:  0.9 % NaCl with KCl 20 mEq / L      Procedures/Studies: CT ANGIO HEAD NECK W WO CM  Result Date: 01/16/2022 CLINICAL DATA:  Stroke. Right-sided tingling. Dizziness. Fall and LOC today. EXAM: CT ANGIOGRAPHY HEAD AND NECK TECHNIQUE: Multidetector CT imaging of the head and neck was performed using the standard protocol during bolus administration of intravenous contrast. Multiplanar CT image reconstructions and MIPs were obtained to evaluate the vascular anatomy. Carotid stenosis measurements (when applicable) are obtained utilizing NASCET criteria, using the distal internal carotid diameter  as the denominator. RADIATION DOSE REDUCTION: This exam was performed according to the departmental dose-optimization program which includes automated exposure control, adjustment of the mA and/or kV according to patient size and/or use of iterative reconstruction technique. CONTRAST:  76mL OMNIPAQUE IOHEXOL 350 MG/ML SOLN COMPARISON:  CT head and MRI head 01/16/2022 FINDINGS: CTA NECK FINDINGS Aortic arch: Atherosclerotic calcification in the aortic arch. Proximal great vessels patent without significant stenosis. Right carotid system: Mild atherosclerotic disease right carotid bifurcation. No significant stenosis. Left carotid system: Mild atherosclerotic disease left carotid bifurcation. No significant stenosis. Vertebral arteries: Both vertebral arteries patent to the skull base without stenosis. Skeleton: Mild degenerative change in the cervical spine. No acute skeletal abnormality. Other neck: Negative for mass or adenopathy. Subcentimeter thyroid nodules bilaterally. No further imaging necessary. (Ref: J Am Coll  Radiol. 2015 Feb;12(2): 143-50). Upper chest: Lung apices clear bilaterally. Review of the MIP images confirms the above findings CTA HEAD FINDINGS Anterior circulation: Atherosclerotic calcification in the cavernous carotid bilaterally with mild stenosis bilaterally. Anterior and middle cerebral arteries widely patent without stenosis or large vessel occlusion Posterior circulation: Mild atherosclerotic disease distal left vertebral artery. No significant vertebral stenosis. Basilar widely patent. PICA patent bilaterally. Superior cerebellar and posterior cerebral arteries patent bilaterally. Mild stenosis right P2 segment. Venous sinuses: Normal venous enhancement Anatomic variants: None Review of the MIP images confirms the above findings IMPRESSION: 1. Negative for intracranial large vessel occlusion or flow limiting stenosis. 2. Mild stenosis right P2 segment. Mild atherosclerotic stenosis in the cavernous carotid bilaterally 3. No significant carotid or vertebral artery stenosis in the neck. Mild atherosclerotic disease of the carotid bifurcation bilaterally. Electronically Signed   By: Franchot Gallo M.D.   On: 01/16/2022 19:15   MR BRAIN WO CONTRAST  Result Date: 01/16/2022 CLINICAL DATA:  Weakness and slurred speech.  Stroke EXAM: MRI HEAD WITHOUT CONTRAST TECHNIQUE: Multiplanar, multiecho pulse sequences of the brain and surrounding structures were obtained without intravenous contrast. COMPARISON:  CT head 01/16/2022 FINDINGS: Brain: Negative for acute infarct. Mild chronic microvascular ischemic changes. Small chronic infarcts in the white matter. Small chronic lacunar infarction in the left thalamus and right external capsule. Small infarct right inferior cerebellum. Chronic microhemorrhage left temporoparietal lobe. Ventricle size normal.  No mass lesion. Vascular: Normal arterial flow voids. Skull and upper cervical spine: No focal skeletal lesion. Sinuses/Orbits: Mucosal edema paranasal sinuses.  No orbital lesion. Right cataract extraction Other: None IMPRESSION: Negative for acute infarct.  Mild chronic ischemic change. Electronically Signed   By: Franchot Gallo M.D.   On: 01/16/2022 19:02   DG CHEST PORT 1 VIEW  Result Date: 01/16/2022 CLINICAL DATA:  Shortness of breath EXAM: PORTABLE CHEST 1 VIEW COMPARISON:  Radiograph done on 12/11/2018, CT chest done on 02/09/2021 FINDINGS: Transverse diameter of heart is increased. Thoracic aorta is tortuous and ectatic. Lung fields are clear of any infiltrates or pulmonary edema. There is no pleural effusion or pneumothorax. IMPRESSION: There are no focal infiltrates or signs of pulmonary edema. Electronically Signed   By: Elmer Picker M.D.   On: 01/16/2022 20:09   CT HEAD CODE STROKE WO CONTRAST  Result Date: 01/16/2022 CLINICAL DATA:  Neuro deficit, acute, stroke suspected. Additional history provided: Right-sided tingling, dizziness with fall and loss of consciousness today. EXAM: CT HEAD WITHOUT CONTRAST TECHNIQUE: Contiguous axial images were obtained from the base of the skull through the vertex without intravenous contrast. RADIATION DOSE REDUCTION: This exam was performed according to the departmental dose-optimization program which includes automated  exposure control, adjustment of the mA and/or kV according to patient size and/or use of iterative reconstruction technique. COMPARISON:  BRAIN MRI 08/22/2021. HEAD CT 06/10/2019. FINDINGS: Brain: Mild generalized cerebral atrophy. Known chronic lacunar infarcts within the left corona radiata and left thalamus. Known subcentimeter chronic infarct within the right cerebellar hemisphere. Some of these infarcts were better appreciated on the prior brain MRI of 08/22/2021. There is no acute intracranial hemorrhage. No demarcated cortical infarct. No extra-axial fluid collection. No evidence of an intracranial mass. No midline shift. Vascular: No hyperdense vessel.  Atherosclerotic calcifications.  Skull: Normal. Negative for fracture or focal lesion. Sinuses/Orbits: Visualized orbits show no acute finding. Mild mucosal thickening and fluid within the bilateral ethmoid sinuses. Trace mucosal thickening within the right sphenoid sinus. Trace mucosal thickening, and small volume frothy secretions, within the left sphenoid sinus. Mild mucosal thickening within the bilateral maxillary sinuses. The left maxillary sinus is asymmetrically small. ASPECTS [facility] Stroke Program Early CT Score) - Ganglionic level infarction (caudate, lentiform nuclei, internal capsule, insula, M1-M3 cortex): 7 - Supraganglionic infarction (M4-M6 cortex): 3 Total score (0-10 with 10 being normal): 10 Other: Right mastoid effusion. These results were called by telephone at the time of interpretation on 01/16/2022 at 5:42 pm to provider Dr. Regenia Skeeter, who verbally acknowledged these results. IMPRESSION: No evidence of acute intracranial abnormality. Known chronic infarcts within the left corona radiata, left thalamus and right cerebellar hemisphere. Mild generalized cerebral atrophy. Paranasal sinus disease, as described. Right mastoid effusion. Electronically Signed   By: Kellie Simmering D.O.   On: 01/16/2022 17:44    Orson Eva, DO  [facility]  If 7PM-7AM, please contact night-coverage [email or web address] Password [facility] 01/17/2022, 7:27 AM   LOS: 0 days

## 2022-01-17 NOTE — Progress Notes (Signed)
I connected with  ELLARAE NEVITT on 01/17/22 by a video enabled telemedicine application and verified that I am speaking with the correct person using two identifiers.   I discussed the limitations of evaluation and management by telemedicine. The patient expressed understanding and agreed to proceed.   Location of patient: [facility] Location of physician: [facility]  Subjective: Patient states her symptoms have significantly improved, does have mild paresthesias in right fingertip of fingers and right angle of mouth.  States he went to the urgent care because of sore throat and congestion and was given a steroid shot which likely related to increase in her blood sugar.  ROS: negative except above  Examination  Vital signs in last 24 hours: Temp:  [97.6 F (36.4 C)-98.5 F (36.9 C)] 97.9 F (36.6 C) (02/22 0615) Pulse Rate:  [65-102] 65 (02/22 0615) Resp:  [17-29] 19 (02/22 0615) BP: (141-167)/(58-138) 144/73 (02/22 0615) SpO2:  [92 %-100 %] 99 % (02/22 0940) Weight:  [79.8 kg-82 kg] 82 kg (02/21 2131)  General: lying in bed, NAD RS: breathing comfortably Neuro: AOx3, no evidence of aphasia, cranial nerves II through XII grossly intact, antigravity strength in upper extremities without drift, FTN intact bilaterally  Basic Metabolic Panel: Recent Labs  Lab 01/16/22 1723 01/16/22 1727 01/17/22 0444  NA 136 137 137  K 3.5 5.1 3.5  CL 104 105 106  CO2 22  --  25  GLUCOSE 413* 413* 243*  BUN 20 25* 19  CREATININE 1.09* 0.90 0.69  CALCIUM 9.2  --  9.2    CBC: Recent Labs  Lab 01/16/22 1723 01/16/22 1727  WBC 5.4  --   NEUTROABS 4.6  --   HGB 13.8 14.3  HCT 40.4 42.0  MCV 87.1  --   PLT 223  --      Coagulation Studies: Recent Labs    01/16/22 1723  LABPROT 12.4  INR 0.9    Imaging  CTA head and neck with and without contrast 01/16/2022: No large vessel occlusion.  Mild stenosis of right P2 segment. Mild atherosclerotic stenosis in the  cavernous carotid bilaterally. No significant carotid or vertebral artery stenosis in the neck. Mild atherosclerotic disease of the carotid bifurcation bilaterally.  MRI brain without contrast 01/16/2022: Negative for acute infarct.  Mild chronic ischemic change   ASSESSMENT AND PLAN: 71 year old female with hypertension, hyperlipidemia, diabetes, previous stroke who presented with on and off right-sided tingling and weakness which has since improved.   Suspected TIA Intracranial stenosis Carotid stenosis -Differential for symptoms includes TIA versus recrudescence of old symptoms in the setting of hypoglycemia versus less likely seizures -TTE pending  Recommendations -Recommend aspirin 81 mg for 3 months in addition to Plavix and 5 mg daily due to intracranial stenosis -Continue high intensity atorvastatin for secondary stroke prevention -Discussed stroke risk factors, signs of stroke and management of stroke risk factors including hypertension, diabetes, hyperlipidemia -We will obtain routine EEG to look for potential epileptogenicity -If EEG and TTE are within normal limits, patient can be discharged from neurology standpoint -Recommend follow-up with neurology in 8 to 12 weeks   [facility] For questions after 5pm please refer to AMION to reach the Neurologist on call

## 2022-01-17 NOTE — Evaluation (Signed)
Physical Therapy Evaluation Patient Details Name: Yvonne [name] MRN: [id number] DOB: September 26, 1951 Today's Date: 01/17/2022  History of Present Illness  Yvonne [name] is a 71 y.o. female with history of anxiety, GERD, hyperlipidemia, hypertension, type 2 diabetes mellitus, history of TIA, and history of stroke presents ED with chief complaint of syncope.  Patient reports that she takes care of 2 young grandchildren.  She just got home from school, and she was feeling exhausted.  She started to have paresthesias in her face that then moved to her arm and leg, so she told her grandson she was going to go lay [name].  She was not getting up from sitting [name], but already walking around, when she passed out.  She does not think she was out very long.  Her grandson called 911.  Patient reports that she did hit her head on the floor.  She has had a headache for couple of days, and the headache continues still.  It is mild.  Patient reports that she had right-sided face, arm, leg weakness when she woke up.  She does remember feeling very dizzy before she passed out.  She did not have chest pain.  She does admit to palpitations.  Patient reports that she has been having palpitations on and off for a week, they are associated with exertion.  Patient reports she was almost immediately oriented when she came to.  She has no personal history of seizure.  She reports at 1 point her neurologist had previous TIAs may have been seizures, but she was never started on medication for seizure.  Patient does report her grandson has seizures.  Patient reports that she takes Xanax 1 mg in the morning half a milligram at lunch and 1 mg every night.  She had missed 2 doses; the morning and lunch dose today, because she was running late.  Patient also reports that her stress levels have been very high.  Her daughter is currently admitted at stroke, hospital for stroke, and her son recently had an amputation of his lower extremity and then  osteomyelitis, and then revision of the amputation, and is now on antibiotics with a PICC line at home.  Patient has no other complaints at this time.   Clinical Impression  Patient functioning at baseline for functional mobility and gait demonstrating good return for ambulation on level, inclined, declined surfaces and stairs without problem.  Plan:  Patient discharged from physical therapy to care of nursing for ambulation daily as tolerated for length of stay.         Recommendations for follow up therapy are one component of a multi-disciplinary discharge planning process, led by the attending physician.  Recommendations may be updated based on patient status, additional functional criteria and insurance authorization.  Follow Up Recommendations No PT follow up    Assistance Recommended at Discharge None  Patient can return home with the following  Other (comment) (at baseline)    Equipment Recommendations None recommended by PT  Recommendations for Other Services       Functional Status Assessment Patient has not had a recent decline in their functional status     Precautions / Restrictions Precautions Precautions: None Restrictions Weight Bearing Restrictions: No      Mobility  Bed Mobility Overal bed mobility: Independent                  Transfers Overall transfer level: Independent  Ambulation/Gait Ambulation/Gait assistance: Independent, Modified independent (Device/Increase time) Gait Distance (Feet): 200 Feet Assistive device: None Gait Pattern/deviations: WFL(Within Functional Limits) Gait velocity: slightly decreased     General Gait Details: grossly WFL with good return for ambulation on level, inclined and declined surfaces withoout loss of balance  Stairs Stairs: Yes Stairs assistance: Modified independent (Device/Increase time) Stair Management: Two rails, Alternating pattern Number of Stairs: 10 General  stair comments: demonstrates good return for going up/[name] 10 steps using bilateral side rails without loss of balance  Wheelchair Mobility    Modified Rankin (Stroke Patients Only)       Balance Overall balance assessment: Independent                                           Pertinent Vitals/Pain Pain Assessment Pain Assessment: No/denies pain    Home Living Family/patient expects to be discharged to:: Private residence Living Arrangements: Spouse/significant other Available Help at Discharge: Family;Available 24 hours/day Type of Home: House Home Access: Stairs to enter Entrance Stairs-Rails: Can reach both;Right;Left Entrance Stairs-Number of Steps: 7   Home Layout: One level;Other (Comment) Home Equipment: None      Prior Function Prior Level of Function : Independent/Modified Independent             Mobility Comments: Community ambulator without AD, drives ADLs Comments: independent     Hand Dominance   Dominant Hand: Right    Extremity/Trunk Assessment   Upper Extremity Assessment Upper Extremity Assessment: Defer to OT evaluation    Lower Extremity Assessment Lower Extremity Assessment: Overall WFL for tasks assessed    Cervical / Trunk Assessment Cervical / Trunk Assessment: Normal  Communication   Communication: No difficulties  Cognition Arousal/Alertness: Awake/alert Behavior During Therapy: WFL for tasks assessed/performed Overall Cognitive Status: Within Functional Limits for tasks assessed                                          General Comments      Exercises     Assessment/Plan    PT Assessment Patient does not need any further PT services  PT Problem List         PT Treatment Interventions      PT Goals (Current goals can be found in the Care Plan section)  Acute Rehab PT Goals Patient Stated Goal: return home PT Goal Formulation: With patient Time For Goal Achievement:  01/17/22 Potential to Achieve Goals: Good    Frequency       Co-evaluation               AM-PAC PT "6 Clicks" Mobility  Outcome Measure Help needed turning from your back to your side while in a flat bed without using bedrails?: None Help needed moving from lying on your back to sitting on the side of a flat bed without using bedrails?: None Help needed moving to and from a bed to a chair (including a wheelchair)?: None Help needed standing up from a chair using your arms (e.g., wheelchair or bedside chair)?: None Help needed to walk in hospital room?: None Help needed climbing 3-5 steps with a railing? : None 6 Click Score: 24    End of Session   Activity Tolerance: Patient tolerated treatment well Patient left: in bed;with call  bell/phone within reach Nurse Communication: Mobility status PT Visit Diagnosis: Unsteadiness on feet (R26.81);Other abnormalities of gait and mobility (R26.89);Muscle weakness (generalized) (M62.81)    Time: 2669-1675 PT Time Calculation (min) (ACUTE ONLY): 20 min   Charges:   PT Evaluation $PT Eval Moderate Complexity: 1 Mod PT Treatments $Gait Training: 8-22 mins        2:02 PM, 01/17/22 Lonell Grandchild, MPT Physical Therapist with [facility] [phone] office [phone] mobile phone

## 2022-01-18 DIAGNOSIS — R209 Unspecified disturbances of skin sensation: Secondary | ICD-10-CM | POA: Diagnosis not present

## 2022-01-18 DIAGNOSIS — E1165 Type 2 diabetes mellitus with hyperglycemia: Secondary | ICD-10-CM | POA: Diagnosis not present

## 2022-01-18 DIAGNOSIS — F411 Generalized anxiety disorder: Secondary | ICD-10-CM | POA: Diagnosis not present

## 2022-01-18 DIAGNOSIS — E782 Mixed hyperlipidemia: Secondary | ICD-10-CM | POA: Diagnosis not present

## 2022-01-18 DIAGNOSIS — R55 Syncope and collapse: Secondary | ICD-10-CM | POA: Diagnosis not present

## 2022-01-18 DIAGNOSIS — G459 Transient cerebral ischemic attack, unspecified: Secondary | ICD-10-CM

## 2022-01-18 DIAGNOSIS — R2990 Unspecified symptoms and signs involving the nervous system: Secondary | ICD-10-CM | POA: Diagnosis not present

## 2022-01-18 LAB — GLUCOSE, CAPILLARY
Glucose-Capillary: 163 mg/dL — ABNORMAL HIGH (ref 70–99)
Glucose-Capillary: 188 mg/dL — ABNORMAL HIGH (ref 70–99)
Glucose-Capillary: 231 mg/dL — ABNORMAL HIGH (ref 70–99)

## 2022-01-18 MED ORDER — ASPIRIN 81 MG PO CHEW: 81.0000 mg | CHEWABLE_TABLET | Freq: Every day | ORAL | 0 refills | Status: DC

## 2022-01-18 MED ORDER — SODIUM CHLORIDE 0.9 % IV BOLUS
1000.0000 mL | Freq: Once | INTRAVENOUS | Status: AC
  Administered 2022-01-18: 1000 mL via INTRAVENOUS

## 2022-01-18 MED ORDER — CYANOCOBALAMIN 500 MCG PO TABS: 500.0000 ug | ORAL_TABLET | Freq: Every day | ORAL | Status: DC

## 2022-01-18 NOTE — Discharge Summary (Signed)
Physician Discharge Summary   Patient: Yvonne [name] MRN: [id number] DOB: 1951-08-29  Admit date:     01/16/2022  Discharge date: 01/18/22  Discharge Physician: Shanon Brow Anup Brigham   PCP: [name], MD   Recommendations at discharge:   Please follow up with primary care provider within 1-2 weeks  Please repeat BMP and CBC in one week   Discharge Diagnoses:   Hospital Course: 71 year old female with a history of hypertension, hyperlipidemia, diabetes mellitus type 2, stroke, anxiety presenting with a syncopal event.  The patient states that she was walking from her kitchen back to her bedroom when she was feeling dizzy and fell onto the floor.  Apparently, her grandson activated EMS.  The patient relates a 1 week history of intermittent numbness and tingling to her right arm and right leg with associated right upper extremity weakness.  She states that she had an episode on 01/15/2022.  She woke up on 01/16/2022 with similar symptoms albeit somewhat improved.  She went to urgent care on the morning of 01/16/2022 because of coughing, congestion, and sore throat.  She was given some type of antibiotic and a steroid injection.  The patient relates that she has had some difficulties with her medications.  She states that " I cannot tell what on taking and whether or not on taking it or not."  She states that there are multiple occasions where she misses her medications.  She denies any headache, neck pain chest pain, shortness of breath, hemoptysis, nausea, vomiting, diarrhea.  She has had a nonproductive cough.  She stated that she had a fever of 102 on the morning of 01/15/2022. In the ED, the patient was noted to have serum glucose of 413 and blood pressure 163/77.  BMP showed sodium 137, potassium 5.1, serum creatinine 0.90, serum glucose 413, WBC 5.4, hemoglobin 13.8, platelets 223,000.  CT of the brain was negative for acute findings but showed chronic infarcts in the left corona radiata, left thalamus,  and right cerebellar hemisphere.  She was seen by telemetry neurology.  Because of concerns of a small vessel stroke, she was admitted for further stroke work-up.   Assessment and Plan: * Syncope and collapse- (present on admission) Personally reviewed telemetry--no concerning dysrhythmia\ Clinical history suggestive of vasovagal/dysautonomia from her poorly controlled diabetes mellitus Check orthostatics--positive Personally reviewed EKG--sinus rhythm, nonspecific T wave change 01/17/22 Echo EF 65-70%, no WMA, G1DD, mild TR, no PFO Personally reviewed chest x-ray--no infiltrates or edema IVF given with symptomatic improvement  Sensory disturbance Neurology consult appreciated Due to TIA and component of diabetic polyneuropathy MRI brain negative for acute findings CTA head and neck--negative LVO, patent carotids and vertebrals; mild R- P2 stenosis Continue Plavix Add ASA 81 mg x 90 days with plavix, then back to plavix monotherapy PT evaluation--no follow up needed Hemoglobin A1c--7.9 LDL 132 Echocardiogram Check TSH Serum B93--903 Folic acid 00.9  Uncontrolled type 2 diabetes mellitus with hyperglycemia, without long-term current use of insulin (HCC) Holding glipizide Patient refuses insulin throughout hospitalization Patient is also on [place] and Ozempic at home 11/30/2021 hemoglobin A1c 7.8 01/17/22 A1C--7.9  HLD (hyperlipidemia)- (present on admission) LDL 132 Continue with statin Patient states that she has not been fully compliant at home  Acid reflux disease- (present on admission) Continue pepcid  GAD (generalized anxiety disorder)- (present on admission) PDMP reviewed Patient receives xanax 1 mg, #75 monthly  Hypertension- (present on admission) Permissive HTN per neuro Hold amlodipine>>restart after d/c Treat for systolic > 233  Consultants: neurology Procedures performed: EEG   Disposition: Home Diet recommendation:  Carb modified  diet  DISCHARGE MEDICATION: Allergies as of 01/18/2022       Reactions   Penicillins Hives, Itching   Has patient had a PCN reaction causing immediate rash, facial/tongue/throat swelling, SOB or lightheadedness with hypotension: Yes Has patient had a PCN reaction causing severe rash involving mucus membranes or skin necrosis: Unk Has patient had a PCN reaction that required hospitalization: No Has patient had a PCN reaction occurring within the last 10 years: No If all of the above answers are "NO", then may proceed with Cephalosporin use. Has patient had a PCN reaction causing immediate rash, facial/tongue/throat swelling, SOB or lightheadedness with hypotension: Yes Has patient had a PCN reaction causing severe rash involving mucus membranes or skin necrosis: No Has patient had a PCN reaction that required hospitalization No Has patient had a PCN reaction occurring within the last 10 years: No If all of the above answers are "NO", then may proceed with Cephalosporin use. Has patient had a PCN reaction causing immediate rash, facial/tongue/throat swelling, SOB or lightheadedness with hypotension: Yes Has patient had a PCN reaction causing severe rash involving mucus membranes or skin necrosis: Unk Has patient had a PCN reaction that required hospitalization: No Has patient had a PCN reaction occurring within the last 10 years: No If all of the above answers are "NO", then may proceed with Cephalosporin use. Has patient had a PCN reaction causing immediate rash, facial/tongue/throat swelling, SOB or lightheadedness with hypotension: Yes Has patient had a PCN reaction causing severe rash involving mucus membranes or skin necrosis: No Has patient had a PCN reaction that required hospitalization No Has patient had a PCN reaction occurring within the last 10 years: No If all of the above answers are "NO", then may proceed with Cephalosporin use. Has patient had a PCN reaction causing immediate  rash, facial/tongue/throat swelling, SOB or lightheadedness with hypotension: Yes Has patient had a PCN reaction causing sev... (TRUNCATED)   Gabapentin Other (See Comments)   Causes a lot of lethargy at a higher dose Causes a lot of lethargy at a higher dose Causes a lot of lethargy at a higher dose   Lisinopril Cough   HAS A CHRONIC COUGH AND DID NOT NOTICE IMPROVEMENT OF COUGH WHEN LISINOPRIL WAS STOPPED HAS A CHRONIC COUGH AND DID NOT NOTICE IMPROVEMENT OF COUGH WHEN LISINOPRIL WAS STOPPED HAS A CHRONIC COUGH AND DID NOT NOTICE IMPROVEMENT OF COUGH WHEN LISINOPRIL WAS STOPPED   Soap Rash   DIAL soap        Medication List     STOP taking these medications    Breo Ellipta 100-25 MCG/ACT Aepb Generic drug: fluticasone furoate-vilanterol   Cheratussin AC 100-10 MG/5ML syrup Generic drug: guaiFENesin-codeine   levofloxacin 500 MG tablet Commonly known as: LEVAQUIN   loratadine 10 MG tablet Commonly known as: CLARITIN       TAKE these medications    Accu-Chek Guide test strip Generic drug: glucose blood TEST BLOOD SUGAR 4 TIMES DAILY Dx E11.69   Accu-Chek Guide w/Device Kit Test Bs QID Dx E11.69   Accu-Chek Softclix Lancets lancets Test BS up to 4 times daily Dx E11.69   albuterol 108 (90 Base) MCG/ACT inhaler Commonly known as: VENTOLIN HFA TAKE 2 PUFFS BY MOUTH EVERY 6 HOURS AS NEEDED FOR WHEEZE OR SHORTNESS OF BREATH What changed: See the new instructions.   alendronate 70 MG tablet Commonly known as: FOSAMAX Take 1 tablet (70  mg total) by mouth every 7 (seven) days. Take with a full glass of water on an empty stomach.   ALLEGRA PO Take 1 tablet by mouth daily.   ALPRAZolam 1 MG tablet Commonly known as: XANAX ! Each morning, one each evening, and 1/2 each afternoon   amLODipine 10 MG tablet Commonly known as: NORVASC TAKE 1 TABLET BY MOUTH EVERY DAY   aspirin 81 MG chewable tablet Chew 1 tablet (81 mg total) by mouth daily. X 90 days Start  taking on: January 19, 2022   atorvastatin 80 MG tablet Commonly known as: LIPITOR Take 1 tablet (80 mg total) by mouth daily.   clopidogrel 75 MG tablet Commonly known as: PLAVIX TAKE 1 TABLET BY MOUTH EVERY DAY WITH BREAKFAST   dapagliflozin propanediol 10 MG Tabs tablet Commonly known as: FARXIGA Take 1 tablet (10 mg total) by mouth daily.   ezetimibe 10 MG tablet Commonly known as: ZETIA TAKE 1 TABLET BY MOUTH DAILY. FOR CHOLESTEROL   famotidine 20 MG tablet Commonly known as: PEPCID Take 20 mg by mouth at bedtime.   furosemide 20 MG tablet Commonly known as: LASIX Take 1 tablet (20 mg total) by mouth daily.   glipiZIDE 5 MG tablet Commonly known as: GLUCOTROL Take 1 tablet (5 mg total) by mouth daily before breakfast.   nitroGLYCERIN 0.4 MG SL tablet Commonly known as: NITROSTAT Place 1 tablet (0.4 mg total) under the tongue every 5 (five) minutes as needed for chest pain.   ofloxacin 0.3 % OTIC solution Commonly known as: FLOXIN Place 5 drops into both ears 2 (two) times daily.   Ozempic (2 MG/DOSE) 8 MG/3ML Sopn Generic drug: Semaglutide (2 MG/DOSE) Inject 2 mg into the skin once a week.   predniSONE 10 MG tablet Commonly known as: DELTASONE Take by mouth.   solifenacin 10 MG tablet Commonly known as: VESICARE Take 10 mg by mouth daily.   vitamin B-12 500 MCG tablet Commonly known as: CYANOCOBALAMIN Take 1 tablet (500 mcg total) by mouth daily. Start taking on: January 19, 2022         Discharge Exam: Danley Danker Weights   01/16/22 1653 01/16/22 2131  Weight: 79.8 kg 82 kg   HEENT:  [place]/AT, No thrush, no icterus CV:  RRR, no rub, no S3, no S4 Lung:  CTA, no wheeze, no rhonchi Abd:  soft/+BS, NT Ext:  No edema, no lymphangitis, no synovitis, no rash Neuro:  CN II-XII intact, strength 4/5 in RUE, RLE, strength 4/5 LUE, LLE; sensation intact bilateral; no dysmetria; babinski equivocal   Condition at discharge: stable  The results of  significant diagnostics from this hospitalization (including imaging, microbiology, ancillary and laboratory) are listed below for reference.   Imaging Studies: CT ANGIO HEAD NECK W WO CM  Result Date: 01/16/2022 CLINICAL DATA:  Stroke. Right-sided tingling. Dizziness. Fall and LOC today. EXAM: CT ANGIOGRAPHY HEAD AND NECK TECHNIQUE: Multidetector CT imaging of the head and neck was performed using the standard protocol during bolus administration of intravenous contrast. Multiplanar CT image reconstructions and MIPs were obtained to evaluate the vascular anatomy. Carotid stenosis measurements (when applicable) are obtained utilizing NASCET criteria, using the distal internal carotid diameter as the denominator. RADIATION DOSE REDUCTION: This exam was performed according to the departmental dose-optimization program which includes automated exposure control, adjustment of the mA and/or kV according to patient size and/or use of iterative reconstruction technique. CONTRAST:  12m OMNIPAQUE IOHEXOL 350 MG/ML SOLN COMPARISON:  CT head and MRI head 01/16/2022 FINDINGS:  CTA NECK FINDINGS Aortic arch: Atherosclerotic calcification in the aortic arch. Proximal great vessels patent without significant stenosis. Right carotid system: Mild atherosclerotic disease right carotid bifurcation. No significant stenosis. Left carotid system: Mild atherosclerotic disease left carotid bifurcation. No significant stenosis. Vertebral arteries: Both vertebral arteries patent to the skull base without stenosis. Skeleton: Mild degenerative change in the cervical spine. No acute skeletal abnormality. Other neck: Negative for mass or adenopathy. Subcentimeter thyroid nodules bilaterally. No further imaging necessary. (Ref: J Am Coll Radiol. 2015 Feb;12(2): 143-50). Upper chest: Lung apices clear bilaterally. Review of the MIP images confirms the above findings CTA HEAD FINDINGS Anterior circulation: Atherosclerotic calcification in the  cavernous carotid bilaterally with mild stenosis bilaterally. Anterior and middle cerebral arteries widely patent without stenosis or large vessel occlusion Posterior circulation: Mild atherosclerotic disease distal left vertebral artery. No significant vertebral stenosis. Basilar widely patent. PICA patent bilaterally. Superior cerebellar and posterior cerebral arteries patent bilaterally. Mild stenosis right P2 segment. Venous sinuses: Normal venous enhancement Anatomic variants: None Review of the MIP images confirms the above findings IMPRESSION: 1. Negative for intracranial large vessel occlusion or flow limiting stenosis. 2. Mild stenosis right P2 segment. Mild atherosclerotic stenosis in the cavernous carotid bilaterally 3. No significant carotid or vertebral artery stenosis in the neck. Mild atherosclerotic disease of the carotid bifurcation bilaterally. Electronically Signed   By: Franchot Gallo M.D.   On: 01/16/2022 19:15   MR BRAIN WO CONTRAST  Result Date: 01/16/2022 CLINICAL DATA:  Weakness and slurred speech.  Stroke EXAM: MRI HEAD WITHOUT CONTRAST TECHNIQUE: Multiplanar, multiecho pulse sequences of the brain and surrounding structures were obtained without intravenous contrast. COMPARISON:  CT head 01/16/2022 FINDINGS: Brain: Negative for acute infarct. Mild chronic microvascular ischemic changes. Small chronic infarcts in the white matter. Small chronic lacunar infarction in the left thalamus and right external capsule. Small infarct right inferior cerebellum. Chronic microhemorrhage left temporoparietal lobe. Ventricle size normal.  No mass lesion. Vascular: Normal arterial flow voids. Skull and upper cervical spine: No focal skeletal lesion. Sinuses/Orbits: Mucosal edema paranasal sinuses. No orbital lesion. Right cataract extraction Other: None IMPRESSION: Negative for acute infarct.  Mild chronic ischemic change. Electronically Signed   By: Franchot Gallo M.D.   On: 01/16/2022 19:02   DG  CHEST PORT 1 VIEW  Result Date: 01/16/2022 CLINICAL DATA:  Shortness of breath EXAM: PORTABLE CHEST 1 VIEW COMPARISON:  Radiograph done on 12/11/2018, CT chest done on 02/09/2021 FINDINGS: Transverse diameter of heart is increased. Thoracic aorta is tortuous and ectatic. Lung fields are clear of any infiltrates or pulmonary edema. There is no pleural effusion or pneumothorax. IMPRESSION: There are no focal infiltrates or signs of pulmonary edema. Electronically Signed   By: Elmer Picker M.D.   On: 01/16/2022 20:09   EEG adult  Result Date: 01/18/2022 Lora Havens, MD     01/18/2022  8:14 AM Patient Name: ALEXANDERA KUNTZMAN MRN: [id number] Epilepsy Attending: Lora Havens Referring Physician/Provider: Lora Havens, MD Date: 01/17/2022 Duration: 22.53 mins Patient history: 71 year old female with hypertension, hyperlipidemia, diabetes, previous stroke who presented with on and off right-sided tingling and weakness which has since improved. EEG to evaluate for seizure Level of alertness: Awake, asleep AEDs during EEG study: None Technical aspects: This EEG study was done with scalp electrodes positioned according to the 10-20 International system of electrode placement. Electrical activity was acquired at a sampling rate of _0  and reviewed with a high frequency filter of _1  and a low frequency filter  of _0 . EEG data were recorded continuously and digitally stored. Description: The posterior dominant rhythm consists of 8-9 Hz activity of moderate voltage (25-35 uV) seen predominantly in posterior head regions, symmetric and reactive to eye opening and eye closing. Sleep was characterized by vertex waves, sleep spindles (12 to 14 Hz), maximal frontocentral region. Hyperventilation and photic stimulation were not performed.   IMPRESSION: This study is within normal limits. No seizures or epileptiform discharges were seen throughout the recording. Lora Havens   ECHOCARDIOGRAM  COMPLETE  Result Date: 01/17/2022    ECHOCARDIOGRAM REPORT   Patient Name:   CINTHYA BORS Date of Exam: 01/17/2022 Medical Rec #:  [id number]     Height:       62.0 in Accession #:    [id number]    Weight:       180.8 lb Date of Birth:  10/19/1951      BSA:          1.831 m Patient Age:    79 years      BP:           113/59 mmHg Patient Gender: F             HR:           63 bpm. Exam Location:  [facility] Procedure: 2D Echo, Cardiac Doppler and Color Doppler Indications:    Stroke  History:        Patient has prior history of Echocardiogram examinations, most                 recent 06/11/2019. CHF, TIA, Signs/Symptoms:Syncope; Risk                 Factors:Hypertension, Diabetes and Dyslipidemia.  Sonographer:    Wenda Low Referring Phys: [id number] ASIA B [place]  1. Left ventricular ejection fraction, by estimation, is 65 to 70%. The left ventricle has normal function. The left ventricle has no regional wall motion abnormalities. There is moderate concentric left ventricular hypertrophy. Left ventricular diastolic parameters are consistent with Grade I diastolic dysfunction (impaired relaxation). Elevated left ventricular end-diastolic pressure.  2. Right ventricular systolic function is normal. The right ventricular size is normal. There is normal pulmonary artery systolic pressure. The estimated right ventricular systolic pressure is 23.5 mmHg.  3. Left atrial size was upper normal.  4. The mitral valve is grossly normal. No evidence of mitral valve regurgitation.  5. The aortic valve is tricuspid. Aortic valve regurgitation is not visualized. No aortic stenosis is present. Aortic valve mean gradient measures 3.0 mmHg.  6. The inferior vena cava is normal in size with greater than 50% respiratory variability, suggesting right atrial pressure of 3 mmHg. Comparison(s): Prior images reviewed side by side. LVEF remains normal range. FINDINGS  Left Ventricle: Left ventricular ejection fraction, by  estimation, is 65 to 70%. The left ventricle has normal function. The left ventricle has no regional wall motion abnormalities. The left ventricular internal cavity size was normal in size. There is  moderate concentric left ventricular hypertrophy. Left ventricular diastolic parameters are consistent with Grade I diastolic dysfunction (impaired relaxation). Elevated left ventricular end-diastolic pressure. Right Ventricle: The right ventricular size is normal. No increase in right ventricular wall thickness. Right ventricular systolic function is normal. There is normal pulmonary artery systolic pressure. The tricuspid regurgitant velocity is 2.42 m/s, and  with an assumed right atrial pressure of 3 mmHg, the estimated right ventricular systolic pressure is 57.3 mmHg. Left Atrium: Left  atrial size was upper normal. Right Atrium: Right atrial size was normal in size. Pericardium: There is no evidence of pericardial effusion. Presence of epicardial fat layer. Mitral Valve: The mitral valve is grossly normal. No evidence of mitral valve regurgitation. MV peak gradient, 6.2 mmHg. The mean mitral valve gradient is 2.0 mmHg. Tricuspid Valve: The tricuspid valve is grossly normal. Tricuspid valve regurgitation is mild. Aortic Valve: The aortic valve is tricuspid. There is mild aortic valve annular calcification. Aortic valve regurgitation is not visualized. No aortic stenosis is present. Aortic valve mean gradient measures 3.0 mmHg. Aortic valve peak gradient measures 4.2 mmHg. Aortic valve area, by VTI measures 2.63 cm. Pulmonic Valve: The pulmonic valve was grossly normal. Pulmonic valve regurgitation is trivial. Aorta: The aortic root is normal in size and structure. Venous: The inferior vena cava is normal in size with greater than 50% respiratory variability, suggesting right atrial pressure of 3 mmHg. IAS/Shunts: No atrial level shunt detected by color flow Doppler.  LEFT VENTRICLE PLAX 2D LVIDd:         3.80 cm      Diastology LVIDs:         2.60 cm     LV e' medial:    4.90 cm/s LV PW:         1.40 cm     LV E/e' medial:  18.5 LV IVS:        1.50 cm     LV e' lateral:   6.85 cm/s LVOT diam:     2.00 cm     LV E/e' lateral: 13.3 LV SV:         75 LV SV Index:   41 LVOT Area:     3.14 cm  LV Volumes (MOD) LV vol d, MOD A2C: 45.3 ml LV vol d, MOD A4C: 47.8 ml LV vol s, MOD A2C: 14.8 ml LV vol s, MOD A4C: 12.5 ml LV SV MOD A2C:     30.5 ml LV SV MOD A4C:     47.8 ml LV SV MOD BP:      34.4 ml RIGHT VENTRICLE RV Basal diam:  3.00 cm RV Mid diam:    2.80 cm RV S prime:     10.10 cm/s TAPSE (M-mode): 2.2 cm LEFT ATRIUM             Index        RIGHT ATRIUM           Index LA diam:        3.90 cm 2.13 cm/m   RA Area:     11.80 cm LA Vol (A2C):   66.6 ml 36.37 ml/m  RA Volume:   23.40 ml  12.78 ml/m LA Vol (A4C):   48.5 ml 26.48 ml/m LA Biplane Vol: 59.4 ml 32.44 ml/m  AORTIC VALVE                    PULMONIC VALVE AV Area (Vmax):    2.83 cm     PV Vmax:       0.68 m/s AV Area (Vmean):   2.50 cm     PV Peak grad:  1.9 mmHg AV Area (VTI):     2.63 cm AV Vmax:           102.00 cm/s AV Vmean:          76.300 cm/s AV VTI:            0.286 m AV Peak Grad:  4.2 mmHg AV Mean Grad:      3.0 mmHg LVOT Vmax:         92.00 cm/s LVOT Vmean:        60.600 cm/s LVOT VTI:          0.239 m LVOT/AV VTI ratio: 0.84  AORTA Ao Root diam: 3.30 cm Ao Asc diam:  3.20 cm MITRAL VALVE               TRICUSPID VALVE MV Area (PHT): 2.94 cm    TR Peak grad:   23.4 mmHg MV Area VTI:   2.19 cm    TR Vmax:        242.00 cm/s MV Peak grad:  6.2 mmHg MV Mean grad:  2.0 mmHg    SHUNTS MV Vmax:       1.25 m/s    Systemic VTI:  0.24 m MV Vmean:      65.3 cm/s   Systemic Diam: 2.00 cm MV Decel Time: 258 msec MV E velocity: 90.80 cm/s MV A velocity: 96.40 cm/s MV E/A ratio:  0.94 Rozann Lesches MD Electronically signed by Rozann Lesches MD Signature Date/Time: 01/17/2022/4:19:04 PM    Final    CT HEAD CODE STROKE WO CONTRAST  Result Date:  01/16/2022 CLINICAL DATA:  Neuro deficit, acute, stroke suspected. Additional history provided: Right-sided tingling, dizziness with fall and loss of consciousness today. EXAM: CT HEAD WITHOUT CONTRAST TECHNIQUE: Contiguous axial images were obtained from the base of the skull through the vertex without intravenous contrast. RADIATION DOSE REDUCTION: This exam was performed according to the departmental dose-optimization program which includes automated exposure control, adjustment of the mA and/or kV according to patient size and/or use of iterative reconstruction technique. COMPARISON:  BRAIN MRI 08/22/2021. HEAD CT 06/10/2019. FINDINGS: Brain: Mild generalized cerebral atrophy. Known chronic lacunar infarcts within the left corona radiata and left thalamus. Known subcentimeter chronic infarct within the right cerebellar hemisphere. Some of these infarcts were better appreciated on the prior brain MRI of 08/22/2021. There is no acute intracranial hemorrhage. No demarcated cortical infarct. No extra-axial fluid collection. No evidence of an intracranial mass. No midline shift. Vascular: No hyperdense vessel.  Atherosclerotic calcifications. Skull: Normal. Negative for fracture or focal lesion. Sinuses/Orbits: Visualized orbits show no acute finding. Mild mucosal thickening and fluid within the bilateral ethmoid sinuses. Trace mucosal thickening within the right sphenoid sinus. Trace mucosal thickening, and small volume frothy secretions, within the left sphenoid sinus. Mild mucosal thickening within the bilateral maxillary sinuses. The left maxillary sinus is asymmetrically small. ASPECTS [facility] Stroke Program Early CT Score) - Ganglionic level infarction (caudate, lentiform nuclei, internal capsule, insula, M1-M3 cortex): 7 - Supraganglionic infarction (M4-M6 cortex): 3 Total score (0-10 with 10 being normal): 10 Other: Right mastoid effusion. These results were called by telephone at the time of interpretation  on 01/16/2022 at 5:42 pm to provider Dr. Regenia Skeeter, who verbally acknowledged these results. IMPRESSION: No evidence of acute intracranial abnormality. Known chronic infarcts within the left corona radiata, left thalamus and right cerebellar hemisphere. Mild generalized cerebral atrophy. Paranasal sinus disease, as described. Right mastoid effusion. Electronically Signed   By: Kellie Simmering D.O.   On: 01/16/2022 17:44    Microbiology: Results for orders placed or performed during the hospital encounter of 01/16/22  Resp Panel by RT-PCR (Flu A&B, Covid) Nasopharyngeal Swab     Status: None   Collection Time: 01/16/22  7:36 PM   Specimen: Nasopharyngeal Swab; Nasopharyngeal(NP) swabs in vial transport medium  Result Value Ref Range Status   SARS Coronavirus 2 by RT PCR NEGATIVE NEGATIVE Final    Comment: (NOTE) SARS-CoV-2 target nucleic acids are NOT DETECTED.  The SARS-CoV-2 RNA is generally detectable in upper respiratory specimens during the acute phase of infection. The lowest concentration of SARS-CoV-2 viral copies this assay can detect is 138 copies/mL. A negative result does not preclude SARS-Cov-2 infection and should not be used as the sole basis for treatment or other patient management decisions. A negative result may occur with  improper specimen collection/handling, submission of specimen other than nasopharyngeal swab, presence of viral mutation(s) within the areas targeted by this assay, and inadequate number of viral copies(<138 copies/mL). A negative result must be combined with clinical observations, patient history, and epidemiological information. The expected result is Negative.  Fact Sheet for Patients:  EntrepreneurPulse.com.au  Fact Sheet for Healthcare Providers:  IncredibleEmployment.be  This test is no t yet approved or cleared by the [place] FDA and  has been authorized for detection and/or diagnosis of SARS-CoV-2  by FDA under an Emergency Use Authorization (EUA). This EUA will remain  in effect (meaning this test can be used) for the duration of the COVID-19 declaration under Section 564(b)(1) of the Act, 21 U.S.C.section 360bbb-3(b)(1), unless the authorization is terminated  or revoked sooner.       Influenza A by PCR NEGATIVE NEGATIVE Final   Influenza B by PCR NEGATIVE NEGATIVE Final    Comment: (NOTE) The Xpert Xpress SARS-CoV-2/FLU/RSV plus assay is intended as an aid in the diagnosis of influenza from Nasopharyngeal swab specimens and should not be used as a sole basis for treatment. Nasal washings and aspirates are unacceptable for Xpert Xpress SARS-CoV-2/FLU/RSV testing.  Fact Sheet for Patients: EntrepreneurPulse.com.au  Fact Sheet for Healthcare Providers: IncredibleEmployment.be  This test is not yet approved or cleared by the [place] FDA and has been authorized for detection and/or diagnosis of SARS-CoV-2 by FDA under an Emergency Use Authorization (EUA). This EUA will remain in effect (meaning this test can be used) for the duration of the COVID-19 declaration under Section 564(b)(1) of the Act, 21 U.S.C. section 360bbb-3(b)(1), unless the authorization is terminated or revoked.  Performed at [facility], [address], [place], [place] [address]     Labs: CBC: Recent Labs  Lab 01/16/22 1723 01/16/22 1727  WBC 5.4  --   NEUTROABS 4.6  --   HGB 13.8 14.3  HCT 40.4 42.0  MCV 87.1  --   PLT 223  --    Basic Metabolic Panel: Recent Labs  Lab 01/16/22 1723 01/16/22 1727 01/17/22 0444  NA 136 137 137  K 3.5 5.1 3.5  CL 104 105 106  CO2 22  --  25  GLUCOSE 413* 413* 243*  BUN 20 25* 19  CREATININE 1.09* 0.90 0.69  CALCIUM 9.2  --  9.2   Liver Function Tests: Recent Labs  Lab 01/16/22 1723 01/17/22 0444  AST 35 26  ALT 29 29  ALKPHOS 117 107  BILITOT 0.4 0.6  PROT 7.3 6.9  ALBUMIN 4.0 3.6   CBG: Recent  Labs  Lab 01/17/22 1700 01/17/22 2036 01/18/22 0020 01/18/22 0400 01/18/22 0737  GLUCAP 262* 308* 231* 188* 163*    Discharge time spent: greater than 30 minutes.  Signed: Orson Eva, MD [facility] 01/18/2022

## 2022-01-18 NOTE — Procedures (Addendum)
Patient Name: Yvonne [name]  MRN: [id number]  Epilepsy Attending: Lora Havens  Referring Physician/Provider: Lora Havens, MD Date: 01/17/2022 Duration: 22.53 mins  Patient history: 71 year old female with hypertension, hyperlipidemia, diabetes, previous stroke who presented with on and off right-sided tingling and weakness which has since improved. EEG to evaluate for seizure  Level of alertness: Awake, asleep  AEDs during EEG study: None  Technical aspects: This EEG study was done with scalp electrodes positioned according to the 10-20 International system of electrode placement. Electrical activity was acquired at a sampling rate of 500Hz  and reviewed with a high frequency filter of 70Hz  and a low frequency filter of 1Hz . EEG data were recorded continuously and digitally stored.   Description: The posterior dominant rhythm consists of 8-9 Hz activity of moderate voltage (25-35 uV) seen predominantly in posterior head regions, symmetric and reactive to eye opening and eye closing. Sleep was characterized by vertex waves, sleep spindles (12 to 14 Hz), maximal frontocentral region. Hyperventilation and photic stimulation were not performed.     IMPRESSION: This study is within normal limits. No seizures or epileptiform discharges were seen throughout the recording.  Lareen Mullings Barbra Sarks

## 2022-01-18 NOTE — Progress Notes (Signed)
Inpatient Diabetes Program Recommendations  AACE/ADA: New Consensus Statement on Inpatient Glycemic Control   Target Ranges:  Prepandial:   less than 140 mg/dL      Peak postprandial:   less than 180 mg/dL (1-2 hours)      Critically ill patients:  140 - 180 mg/dL    Latest Reference Range & Units 01/18/22 00:20 01/18/22 04:00  Glucose-Capillary 70 - 99 mg/dL 231 (H) 188 (H)    Latest Reference Range & Units 01/17/22 07:45 01/17/22 11:23 01/17/22 17:00 01/17/22 20:36  Glucose-Capillary 70 - 99 mg/dL 231 (H) 264 (H) 262 (H) 308 (H)   Review of Glycemic Control  Diabetes history: DM2 Outpatient Diabetes medications: Farxiga 10 mg QAM, Glipizide 5 mg QAM, Ozempic 2 mg Qweek Current orders for Inpatient glycemic control: Novolog 0-15 units TID with meals, Novolog 0-5 units QHS   Inpatient Diabetes Program Recommendations:     Oral DM medications: Since patient is refusing insulin as inpatient, may want to consider resuming outpatient oral DM medications while inpatient.   Thanks, Barnie Alderman, RN, MSN, CDE Diabetes Coordinator Inpatient Diabetes Program [phone] (Team Pager from 8am to 5pm)

## 2022-01-18 NOTE — Progress Notes (Signed)
Discharge instructions provided. Patient verbalized understanding of discharge instructions. No further questions. Patient states her husband will pick her up after lunch, in about an hour.

## 2022-01-19 ENCOUNTER — Telehealth: Payer: Self-pay

## 2022-01-19 NOTE — Telephone Encounter (Signed)
Transition Care Management Follow-up Telephone Call Date of discharge and from where: [facility] 01/18/22 - [place] How have you been since you were released from the hospital? Just very weak and tired Any questions or concerns? No  Items Reviewed: Did the pt receive and understand the discharge instructions provided? Yes  Medications obtained and verified? Yes  Other? No  Any new allergies since your discharge? No  Dietary orders reviewed? Yes Do you have support at home? Yes   Home Care and Equipment/Supplies: Were home health services ordered? no Were any new equipment or medical supplies ordered?  No   Functional Questionnaire: (I = Independent and D = Dependent) ADLs: I  Bathing/Dressing- I  Meal Prep- D  Eating- I  Maintaining continence- I  Transferring/Ambulation- I  Managing Meds- I  Follow up appointments reviewed:  PCP Hospital f/u appt confirmed? Yes  Scheduled to see [name] on 01/30/22 @ 9:40. [facility] f/u appt confirmed? No   Are transportation arrangements needed? No  If their condition worsens, is the pt aware to call PCP or go to the Emergency Dept.? Yes Was the patient provided with contact information for the PCP's office or ED? Yes Was to pt encouraged to call back with questions or concerns? Yes

## 2022-01-20 LAB — VITAMIN B1: Vitamin B1 (Thiamine): 144.5 nmol/L (ref 66.5–200.0)

## 2022-01-23 ENCOUNTER — Other Ambulatory Visit: Payer: Self-pay

## 2022-01-23 ENCOUNTER — Emergency Department (HOSPITAL_COMMUNITY)
Admission: EM | Admit: 2022-01-23 | Discharge: 2022-01-23 | Disposition: A | Discharge status: Home / Self Care | Payer: Medicare HMO | Attending: Emergency Medicine | Admitting: Emergency Medicine

## 2022-01-23 ENCOUNTER — Encounter (HOSPITAL_COMMUNITY): Payer: Self-pay | Admitting: Emergency Medicine

## 2022-01-23 ENCOUNTER — Emergency Department (HOSPITAL_COMMUNITY): Payer: Medicare HMO

## 2022-01-23 DIAGNOSIS — G459 Transient cerebral ischemic attack, unspecified: Secondary | ICD-10-CM

## 2022-01-23 DIAGNOSIS — I639 Cerebral infarction, unspecified: Secondary | ICD-10-CM | POA: Insufficient documentation

## 2022-01-23 DIAGNOSIS — Z7982 Long term (current) use of aspirin: Secondary | ICD-10-CM | POA: Diagnosis not present

## 2022-01-23 DIAGNOSIS — Z794 Long term (current) use of insulin: Secondary | ICD-10-CM | POA: Diagnosis not present

## 2022-01-23 DIAGNOSIS — I6381 Other cerebral infarction due to occlusion or stenosis of small artery: Secondary | ICD-10-CM | POA: Diagnosis not present

## 2022-01-23 DIAGNOSIS — E1122 Type 2 diabetes mellitus with diabetic chronic kidney disease: Secondary | ICD-10-CM | POA: Diagnosis not present

## 2022-01-23 DIAGNOSIS — J45909 Unspecified asthma, uncomplicated: Secondary | ICD-10-CM | POA: Diagnosis not present

## 2022-01-23 DIAGNOSIS — E785 Hyperlipidemia, unspecified: Secondary | ICD-10-CM

## 2022-01-23 DIAGNOSIS — R202 Paresthesia of skin: Secondary | ICD-10-CM | POA: Diagnosis not present

## 2022-01-23 DIAGNOSIS — Z79899 Other long term (current) drug therapy: Secondary | ICD-10-CM | POA: Diagnosis not present

## 2022-01-23 DIAGNOSIS — Z7984 Long term (current) use of oral hypoglycemic drugs: Secondary | ICD-10-CM | POA: Diagnosis not present

## 2022-01-23 DIAGNOSIS — I10 Essential (primary) hypertension: Secondary | ICD-10-CM | POA: Diagnosis not present

## 2022-01-23 DIAGNOSIS — E119 Type 2 diabetes mellitus without complications: Secondary | ICD-10-CM | POA: Insufficient documentation

## 2022-01-23 LAB — PROTIME-INR
INR: 1 (ref 0.8–1.2)
Prothrombin Time: 12.7 seconds (ref 11.4–15.2)

## 2022-01-23 LAB — COMPREHENSIVE METABOLIC PANEL
ALT: 44 U/L (ref 0–44)
AST: 20 U/L (ref 15–41)
Albumin: 4.1 g/dL (ref 3.5–5.0)
Alkaline Phosphatase: 121 U/L (ref 38–126)
Anion gap: 8 (ref 5–15)
BUN: 25 mg/dL — ABNORMAL HIGH (ref 8–23)
CO2: 23 mmol/L (ref 22–32)
Calcium: 9.3 mg/dL (ref 8.9–10.3)
Chloride: 106 mmol/L (ref 98–111)
Creatinine, Ser: 1.01 mg/dL — ABNORMAL HIGH (ref 0.44–1.00)
GFR, Estimated: 59 mL/min — ABNORMAL LOW (ref >60)
Glucose, Bld: 147 mg/dL — ABNORMAL HIGH (ref 70–99)
Potassium: 3.9 mmol/L (ref 3.5–5.1)
Sodium: 137 mmol/L (ref 135–145)
Total Bilirubin: 0.6 mg/dL (ref 0.3–1.2)
Total Protein: 7.4 g/dL (ref 6.5–8.1)

## 2022-01-23 LAB — DIFFERENTIAL
Abs Immature Granulocytes: 0.02 10*3/uL (ref 0.00–0.07)
Basophils Absolute: 0 10*3/uL (ref 0.0–0.1)
Basophils Relative: 1 %
Eosinophils Absolute: 0.1 10*3/uL (ref 0.0–0.5)
Eosinophils Relative: 2 %
Immature Granulocytes: 0 %
Lymphocytes Relative: 40 %
Lymphs Abs: 2.8 10*3/uL (ref 0.7–4.0)
Monocytes Absolute: 0.5 10*3/uL (ref 0.1–1.0)
Monocytes Relative: 8 %
Neutro Abs: 3.6 10*3/uL (ref 1.7–7.7)
Neutrophils Relative %: 49 %

## 2022-01-23 LAB — CBC
HCT: 40.4 % (ref 36.0–46.0)
Hemoglobin: 13.7 g/dL (ref 12.0–15.0)
MCH: 29.8 pg (ref 26.0–34.0)
MCHC: 33.9 g/dL (ref 30.0–36.0)
MCV: 87.8 fL (ref 80.0–100.0)
Platelets: 235 10*3/uL (ref 150–400)
RBC: 4.6 MIL/uL (ref 3.87–5.11)
RDW: 13.3 % (ref 11.5–15.5)
WBC: 7.1 10*3/uL (ref 4.0–10.5)
nRBC: 0 % (ref 0.0–0.2)

## 2022-01-23 LAB — ETHANOL: Alcohol, Ethyl (B): <10 mg/dL (ref <10)

## 2022-01-23 LAB — CBG MONITORING, ED: Glucose-Capillary: 137 mg/dL — ABNORMAL HIGH (ref 70–99)

## 2022-01-23 LAB — APTT: aPTT: 28 seconds (ref 24–36)

## 2022-01-23 MED ORDER — ACETAMINOPHEN 325 MG PO TABS
650.0000 mg | ORAL_TABLET | Freq: Once | ORAL | Status: AC
  Administered 2022-01-23: 650 mg via ORAL
  Filled 2022-01-23: qty 2

## 2022-01-23 NOTE — ED Notes (Signed)
Tele neuro exam complete.  Pt report complete resolution of symptoms now.  Per Neuro, not a candidate for TPA and unlikely to need admission given recent complete neuro workup

## 2022-01-23 NOTE — ED Provider Notes (Signed)
[facility] EMERGENCY DEPARTMENT Provider Note   CSN: [id number] Arrival date & time: 01/23/22  1442  An emergency department physician performed an initial assessment on this suspected stroke patient at 1500.  History  Chief Complaint  Patient presents with   Tingling    Yvonne [name] is a 71 y.o. female.  This is a 71 y.o. female  with significant medical history as below, including type 2 diabetes, hyperlipidemia, hypertension, CVA who presents to the ED with complaint of right-sided tingling with onset of symptoms approximate 1:30 PM today.  Patient was hospitalized secondary to CVA around 1 week ago.  Patient has no other acute complaints.  Taking all of her medications as prescribed.  No recent alcohol use, tobacco use, illicit drug use.  No falls.  No change in bowel or bladder function.  No chest pain or dyspnea.  No abdominal pain.  Given onset symptoms, stroke alert was activated.    Past Medical History: No date: Anxiety No date: Arthritis     Comment:  left hand No date: Asthma     Comment:  daily and prn inhalers No date: Chronic cough 12/2017: Chronic otitis media No date: Full dentures No date: GERD (gastroesophageal reflux disease) 09/2017: History of stroke     Comment:  weakness right hand, numbness right side face No date: Hyperlipidemia No date: Hypertension     Comment:  states under control with meds., has been on med. x a               long time, per pt. No date: Non-insulin dependent type 2 diabetes mellitus ([place]) No date: Overactive bladder 02/19/2018: Recurrent acute suppurative otitis media without  spontaneous rupture of left tympanic membrane No date: Stroke ([place]) No date: Transient cerebral ischemia  Past Surgical History: No date: ABDOMINAL HYSTERECTOMY     Comment:  complete No date: CATARACT EXTRACTION W/ INTRAOCULAR LENS IMPLANT; Right No date: CHOLECYSTECTOMY 08/17/2015: COLONOSCOPY; N/A     Comment:  Procedure: COLONOSCOPY;   Surgeon: Rogene Houston, MD;                Location[facility];  Service: Endoscopy;                Laterality: N/A;  200 - moved to 7:30 - Ann notified pt No date: GAS INSERTION; Right     Comment:  x 2 - eye No date: LACRIMAL DUCT EXPLORATION; Bilateral     Comment:  removal of tear ducts 01/28/2018: MYRINGOTOMY WITH TUBE PLACEMENT; Bilateral     Comment:  Procedure: BILATERAL MYRINGOTOMY WITH TUBE PLACEMENT;                Surgeon: Leta Baptist, MD;  Location: [facility];  Service: ENT;  Laterality: Bilateral; 09/14/2015: VITRECTOMY; Left    The history is provided by the patient. No language interpreter was used.      Home Medications Prior to Admission medications   Medication Sig Start Date End Date Taking? Authorizing Provider  Accu-Chek Softclix Lancets lancets Test BS up to 4 times daily Dx E11.69 04/04/20   [name], MD  albuterol (VENTOLIN HFA) 108 (90 Base) MCG/ACT inhaler TAKE 2 PUFFS BY MOUTH EVERY 6 HOURS AS NEEDED FOR WHEEZE OR SHORTNESS OF BREATH Patient taking differently: Inhale 2 puffs into the lungs every 6 (six) hours as needed for shortness of breath. 12/11/21  [name], MD  alendronate (FOSAMAX) 70 MG tablet Take 1 tablet (70 mg total) by mouth every 7 (seven) days. Take with a full glass of water on an empty stomach. 03/15/21   [name], MD  ALPRAZolam Duanne Moron) 1 MG tablet ! Each morning, one each evening, and 1/2 each afternoon 11/30/21   [name], MD  amLODipine (NORVASC) 10 MG tablet TAKE 1 TABLET BY MOUTH EVERY DAY 11/30/21   [name], MD  aspirin 81 MG chewable tablet Chew 1 tablet (81 mg total) by mouth daily. X 90 days 01/19/22   Orson Eva, MD  atorvastatin (LIPITOR) 80 MG tablet Take 1 tablet (80 mg total) by mouth daily. 02/15/21   Werner Lean, MD  Blood Glucose Monitoring Suppl (ACCU-CHEK GUIDE) w/Device KIT Test Bs QID Dx E11.69 02/28/21   [name], MD  clopidogrel (PLAVIX) 75 MG  tablet TAKE 1 TABLET BY MOUTH EVERY DAY WITH BREAKFAST 11/30/21   [name], MD  dapagliflozin propanediol (FARXIGA) 10 MG TABS tablet Take 1 tablet (10 mg total) by mouth daily. 01/11/22   [name], MD  ezetimibe (ZETIA) 10 MG tablet TAKE 1 TABLET BY MOUTH DAILY. FOR CHOLESTEROL 11/30/21   [name], MD  famotidine (PEPCID) 20 MG tablet Take 20 mg by mouth at bedtime.    [provider]  Fexofenadine HCl (ALLEGRA PO) Take 1 tablet by mouth daily.    [provider]  furosemide (LASIX) 20 MG tablet Take 1 tablet (20 mg total) by mouth daily. 11/02/20   [name], MD  glipiZIDE (GLUCOTROL) 5 MG tablet Take 1 tablet (5 mg total) by mouth daily before breakfast. 11/30/21   [name], MD  glucose blood (ACCU-CHEK GUIDE) test strip TEST BLOOD SUGAR 4 TIMES DAILY Dx E11.69 03/27/21   [name], MD  levofloxacin (LEVAQUIN) 500 MG tablet Take by mouth. 01/16/22 01/26/22  [provider]  nitroGLYCERIN (NITROSTAT) 0.4 MG SL tablet Place 1 tablet (0.4 mg total) under the tongue every 5 (five) minutes as needed for chest pain. 02/15/21   Chandrasekhar, Lyda Kalata A, MD  ofloxacin (FLOXIN) 0.3 % OTIC solution Place 5 drops into both ears 2 (two) times daily.    [provider]  predniSONE (DELTASONE) 10 MG tablet Take by mouth. 01/16/22   [provider]  Semaglutide, 2 MG/DOSE, (OZEMPIC, 2 MG/DOSE,) 8 MG/3ML SOPN Inject 2 mg into the skin once a week. 11/30/21   [name], MD  solifenacin (VESICARE) 10 MG tablet Take 10 mg by mouth daily. 07/25/21   [provider]  vitamin B-12 (CYANOCOBALAMIN) 500 MCG tablet Take 1 tablet (500 mcg total) by mouth daily. 01/19/22   Orson Eva, MD      Allergies    Penicillins, Gabapentin, Lisinopril, and Soap    Review of Systems   Review of Systems  Constitutional:  Negative for chills and fever.  HENT:  Negative for facial swelling and trouble swallowing.   Eyes:  Negative for photophobia and visual  disturbance.  Respiratory:  Negative for cough and shortness of breath.   Cardiovascular:  Negative for chest pain and palpitations.  Gastrointestinal:  Negative for abdominal pain, nausea and vomiting.  Endocrine: Negative for polydipsia and polyuria.  Genitourinary:  Negative for difficulty urinating and hematuria.  Musculoskeletal:  Negative for gait problem and joint swelling.  Skin:  Negative for pallor and rash.  Neurological:  Negative for syncope and headaches.       Tingling  Psychiatric/Behavioral:  Negative for agitation  and confusion.    Physical Exam Updated Vital Signs BP 126/84    Pulse 73    Temp 98 F (36.7 C) (Oral)    Resp 18    SpO2 96%  Physical Exam Vitals and nursing note reviewed.  Constitutional:      General: She is not in acute distress.    Appearance: Normal appearance. She is not ill-appearing, toxic-appearing or diaphoretic.  HENT:     Head: Normocephalic and atraumatic.     Right Ear: External ear normal.     Left Ear: External ear normal.     Nose: Nose normal.     Mouth/Throat:     Mouth: Mucous membranes are moist.  Eyes:     General: No scleral icterus.       Right eye: No discharge.        Left eye: No discharge.     Extraocular Movements: Extraocular movements intact.     Pupils: Pupils are equal, round, and reactive to light.  Cardiovascular:     Rate and Rhythm: Normal rate and regular rhythm.     Pulses: Normal pulses.          Radial pulses are 2+ on the right side and 2+ on the left side.       Posterior tibial pulses are 2+ on the right side and 2+ on the left side.     Heart sounds: Normal heart sounds.  Pulmonary:     Effort: Pulmonary effort is normal. No respiratory distress.     Breath sounds: Normal breath sounds.  Abdominal:     General: Abdomen is flat.     Palpations: Abdomen is soft.     Tenderness: There is no abdominal tenderness.  Musculoskeletal:        General: Normal range of motion.     Cervical back: Normal  range of motion.     Right lower leg: No edema.     Left lower leg: No edema.  Skin:    General: Skin is warm and dry.     Capillary Refill: Capillary refill takes less than 2 seconds.  Neurological:     Mental Status: She is alert and oriented to person, place, and time.     GCS: GCS eye subscore is 4. GCS verbal subscore is 5. GCS motor subscore is 6.     Cranial Nerves: Cranial nerves 2-12 are intact. No dysarthria.     Sensory: Sensation is intact.     Motor: Motor function is intact. No weakness.     Coordination: Coordination is intact.     Gait: Gait is intact.     Comments: Tingling to right upper and right lower extremity  Strength equal and symmetric upper and lower extremities  Psychiatric:        Mood and Affect: Mood normal.        Behavior: Behavior normal.    ED Results / Procedures / Treatments   Labs (all labs ordered are listed, but only abnormal results are displayed) Labs Reviewed  COMPREHENSIVE METABOLIC PANEL - Abnormal; Notable for the following components:      Result Value   Glucose, Bld 147 (*)    BUN 25 (*)    Creatinine, Ser 1.01 (*)    GFR, Estimated 59 (*)    All other components within normal limits  CBG MONITORING, ED - Abnormal; Notable for the following components:   Glucose-Capillary 137 (*)    All other components within normal limits  ETHANOL  PROTIME-INR  APTT  CBC  DIFFERENTIAL    EKG EKG Interpretation  Date/Time:  Tuesday January 23 2022 15:01:43 EST Ventricular Rate:  83 PR Interval:  168 QRS Duration: 84 QT Interval:  378 QTC Calculation: 445 R Axis:   -23 Text Interpretation: Sinus rhythm similar to prior tracing Confirmed by Wynona Dove ([id number]) on 01/23/2022 4:40:52 PM  Radiology CT HEAD CODE STROKE WO CONTRAST  Result Date: 01/23/2022 CLINICAL DATA:  Code stroke. Neuro deficit, acute, stroke suspected. Right arm tingling. EXAM: CT HEAD WITHOUT CONTRAST TECHNIQUE: Contiguous axial images were obtained from the base  of the skull through the vertex without intravenous contrast. RADIATION DOSE REDUCTION: This exam was performed according to the departmental dose-optimization program which includes automated exposure control, adjustment of the mA and/or kV according to patient size and/or use of iterative reconstruction technique. COMPARISON:  Head CT and MRI 01/16/2022 FINDINGS: Brain: There is no evidence of an acute infarct, intracranial hemorrhage, mass, midline shift, or extra-axial fluid collection. Chronic lacunar infarcts are again noted in the left corona radiata, left thalamus, right external capsule, and right cerebellum. The ventricles and sulci are normal. Vascular: Calcified atherosclerosis at the skull base. No hyperdense vessel. Skull: No fracture or suspicious osseous lesion. Sinuses/Orbits: Small volume bubbly fluid in the maxillary sinuses and left sphenoid sinus. Persistent right mastoid effusion. Right cataract extraction. Other: None. ASPECTS [facility] Stroke Program Early CT Score) - Ganglionic level infarction (caudate, lentiform nuclei, internal capsule, insula, M1-M3 cortex): 7 - Supraganglionic infarction (M4-M6 cortex): 3 Total score (0-10 with 10 being normal): 10 IMPRESSION: 1. No evidence of acute intracranial abnormality. ASPECTS of 10. 2. Known chronic lacunar infarcts. These results were called by telephone at the time of interpretation on 01/23/2022 at 3:17 pm to Dr. Varney Biles, who verbally acknowledged these results. Electronically Signed   By: Logan Bores M.D.   On: 01/23/2022 15:18    Procedures .Critical Care Performed by: Jeanell Sparrow, DO Authorized by: Jeanell Sparrow, DO   Critical care provider statement:    Critical care time (minutes):  32   Critical care was necessary to treat or prevent imminent or life-threatening deterioration of the following conditions:  CNS failure or compromise   Critical care was time spent personally by me on the following activities:   Development of treatment plan with patient or surrogate, discussions with consultants, evaluation of patient's response to treatment, examination of patient, ordering and review of laboratory studies, ordering and review of radiographic studies, ordering and performing treatments and interventions, pulse oximetry, re-evaluation of patient's condition, review of old charts and obtaining history from patient or surrogate Comments:     Strokelike symptoms within TNK treatment window    Medications Ordered in ED Medications  acetaminophen (TYLENOL) tablet 650 mg (650 mg Oral Given 01/23/22 1550)    ED Course/ Medical Decision Making/ A&P                           Medical Decision Making Amount and/or Complexity of Data Reviewed External Data Reviewed: labs, radiology, ECG and notes. Labs: ordered. Radiology: ordered. Decision-making details documented in ED Course. ECG/medicine tests: ordered and independent interpretation performed. Decision-making details documented in ED Course.  Risk OTC drugs. Decision regarding hospitalization.    CC: tingling, stroke like symptoms   This patient presents to the Emergency Department for the above complaint. This involves an extensive number of treatment options and is a complaint that  carries with it a high risk of complications and morbidity. Vital signs were reviewed. Serious etiologies considered.  Record review:  Previous records obtained and reviewed   Medical and surgical history as noted above.   Work up as above, notable for:  Labs & imaging results that were available during my care of the patient were visualized by me and considered in my medical decision making.   I ordered imaging studies which included ct stroke and I visualized the imaging and I agree with radiologist interpretation. No acute process  Cardiac monitoring reviewed and interpreted personally which shows NSR  Social determinants of health include -  N/a  Personally discussed patient care with consultant; Dr Quinn Axe neurology   Patient with right upper and right lower extremity tingling.  The symptoms have resolved while speaking to teleneurology.  Discussed with Dr. Quinn Axe.  Given her recent stroke work-up suspicion for an acute CVA is exceedingly low.  Recommend to continue her home medication regimen and follow-up with neuro as outpatient.  Patient received MRI, echo, EEG, last week.  These were unremarkable.  Management: Give tylenol, mild ha  Reassessment:  Neurologic exam is nonfocal.  Headache has resolved.  She is entirely asymptomatic.  She is at her baseline currently.  I have low suspicion for acute intracranial pathology.  CT imaging nonacute.  Neurology has evaluated the patient and cleared from neurology standpoint.  Continue home medications, follows up with PCP and neurology as an outpatient.  The patient improved significantly and was discharged in stable condition. Detailed discussions were had with the patient regarding current findings, and need for close f/u with PCP or on call doctor. The patient has been instructed to return immediately if the symptoms worsen in any way for re-evaluation. Patient verbalized understanding and is in agreement with current care plan. All questions answered prior to discharge.        This chart was dictated using voice recognition software.  Despite best efforts to proofread,  errors can occur which can change the documentation meaning.         Final Clinical Impression(s) / ED Diagnoses Final diagnoses:  Tingling    Rx / DC Orders ED Discharge Orders     None         Jeanell Sparrow, DO 01/23/22 1645

## 2022-01-23 NOTE — ED Notes (Signed)
Pt to CT with RN

## 2022-01-23 NOTE — Discharge Instructions (Addendum)
It was a pleasure caring for you today in the emergency department.  Please return to the emergency department for any worsening or worrisome symptoms.  Please continue taking your home medications as prescribed

## 2022-01-23 NOTE — ED Triage Notes (Signed)
Pt pov, pt just dc with TIA last week. Started having tingling to rightarm and leg at 1330 today and came here. Pt a/o. NIH performed. Edp aware and Code Stroke called.

## 2022-01-23 NOTE — Consult Note (Signed)
NEUROLOGY TELECONSULTATION NOTE   Date of service: January 23, 2022 Patient Name: Yvonne [name] MRN:  [id number] DOB:  06/19/1951 Reason for consult: tingling R hand  Requesting Provider: Dr. Wynona Dove Consult Participants: myself, patient, bedside RN, telestroke RN Location of the provider: [facility] Location of the patient: [facility]  This consult was provided via telemedicine with 2-way video and audio communication. The patient/family was informed that care would be provided in this way and agreed to receive care in this manner.   _ _ _   _ __   _ __ _ _  __ __   _ __   __ _  History of Present Illness   This is a 71 yo woman with hx anxiety, HTN, HL, DM2, recent admission for intermittent R sided numbness and tingling (one week ago) with pan-negative workup including normal EEG who presented to EEG with numbness and tingling of R arm and leg LKW 1330. By the time telestroke was initiated she only had mild tingling in fingers on R hand, and 5 min later when telestroke exam was performed her sx had resolved completely and she had NIHSS = 0. CT head NAICP (personal review). TNK not administered bc sx resolved.    ROS   Per HPI; all other systems reviewed and are negative  Past History   The following was personally reviewed:  Past Medical History:  Diagnosis Date   Anxiety    Arthritis    left hand   Asthma    daily and prn inhalers   Chronic cough    Chronic otitis media 12/2017   Full dentures    GERD (gastroesophageal reflux disease)    History of stroke 09/2017   weakness right hand, numbness right side face   Hyperlipidemia    Hypertension    states under control with meds., has been on med. x a long time, per pt.   Non-insulin dependent type 2 diabetes mellitus ([place])    Overactive bladder    Recurrent acute suppurative otitis media without spontaneous rupture of left tympanic membrane 02/19/2018   Stroke ([place])    Transient cerebral ischemia    Past Surgical History:   Procedure Laterality Date   ABDOMINAL HYSTERECTOMY     complete   CATARACT EXTRACTION W/ INTRAOCULAR LENS IMPLANT Right    CHOLECYSTECTOMY     COLONOSCOPY N/A 08/17/2015   Procedure: COLONOSCOPY;  Surgeon: Rogene Houston, MD;  Location: [facility];  Service: Endoscopy;  Laterality: N/A;  200 - moved to 7:30 - Ann notified pt   GAS INSERTION Right    x 2 - eye   LACRIMAL DUCT EXPLORATION Bilateral    removal of tear ducts   MYRINGOTOMY WITH TUBE PLACEMENT Bilateral 01/28/2018   Procedure: BILATERAL MYRINGOTOMY WITH TUBE PLACEMENT;  Surgeon: Leta Baptist, MD;  Location: [facility];  Service: ENT;  Laterality: Bilateral;   VITRECTOMY Left 09/14/2015   Family History  Problem Relation Age of Onset   Arthritis Mother    Diabetes Mother    CVA Mother    Stroke Mother    Diabetes Father    Heart disease Father        CABG.  Does not know age of onset   Asthma Father    Hepatitis C Sister    Arthritis Brother    Cancer Brother        metastic cancer   Peripheral Artery Disease Daughter    Arthritis-Osteo Son    Social  History   Socioeconomic History   Marital status: Married    Spouse name: Not on file   Number of children: 3   Years of education: Not on file   Highest education level: GED or equivalent  Occupational History   Occupation: retired    Comment: Customer service manager and restaurants  Tobacco Use   Smoking status: Never   Smokeless tobacco: Never  Scientific laboratory technician Use: Never used  Substance and Sexual Activity   Alcohol use: Not Currently   Drug use: No   Sexual activity: Yes  Other Topics Concern   Not on file  Social History Narrative   Lives at home home with husband with son, daughter in law and 3 children.  [name], his girlfriend and her child also moved in 10/2018      Disabled.   Social Determinants of Health   Financial Resource Strain: Low Risk    Difficulty of Paying Living Expenses: Not hard at all  Food Insecurity: No Food  Insecurity   Worried About Charity fundraiser in the Last Year: Never true   [place] in the Last Year: Never true  Transportation Needs: No Transportation Needs   Lack of Transportation (Medical): No   Lack of Transportation (Non-Medical): No  Physical Activity: Sufficiently Active   Days of Exercise per Week: 7 days   Minutes of Exercise per Session: 50 min  Stress: Stress Concern Present   Feeling of Stress : Rather much  Social Connections: Moderately Isolated   Frequency of Communication with Friends and Family: More than three times a week   Frequency of Social Gatherings with Friends and Family: More than three times a week   Attends Religious Services: Never   Marine scientist or Organizations: No   Attends Music therapist: Never   Marital Status: Married   Allergies  Allergen Reactions   Penicillins Hives and Itching    Has patient had a PCN reaction causing immediate rash, facial/tongue/throat swelling, SOB or lightheadedness with hypotension: Yes Has patient had a PCN reaction causing severe rash involving mucus membranes or skin necrosis: Unk Has patient had a PCN reaction that required hospitalization: No Has patient had a PCN reaction occurring within the last 10 years: No If all of the above answers are "NO", then may proceed with Cephalosporin use.  Has patient had a PCN reaction causing immediate rash, facial/tongue/throat swelling, SOB or lightheadedness with hypotension: Yes Has patient had a PCN reaction causing severe rash involving mucus membranes or skin necrosis: No Has patient had a PCN reaction that required hospitalization No Has patient had a PCN reaction occurring within the last 10 years: No If all of the above answers are "NO", then may proceed with Cephalosporin use. Has patient had a PCN reaction causing immediate rash, facial/tongue/throat swelling, SOB or lightheadedness with hypotension: Yes Has patient had a PCN  reaction causing severe rash involving mucus membranes or skin necrosis: Unk Has patient had a PCN reaction that required hospitalization: No Has patient had a PCN reaction occurring within the last 10 years: No If all of the above answers are "NO", then may proceed with Cephalosporin use. Has patient had a PCN reaction causing immediate rash, facial/tongue/throat swelling, SOB or lightheadedness with hypotension: Yes Has patient had a PCN reaction causing severe rash involving mucus membranes or skin necrosis: No Has patient had a PCN reaction that required hospitalization No Has patient had a PCN reaction occurring within  the last 10 years: No If all of the above answers are "NO", then may proceed with Cephalosporin use. Has patient had a PCN reaction causing immediate rash, facial/tongue/throat swelling, SOB or lightheadedness with hypotension: Yes Has patient had a PCN reaction causing sev... (TRUNCATED)   Gabapentin Other (See Comments)    Causes a lot of lethargy at a higher dose Causes a lot of lethargy at a higher dose Causes a lot of lethargy at a higher dose   Lisinopril Cough    HAS A CHRONIC COUGH AND DID NOT NOTICE IMPROVEMENT OF COUGH WHEN LISINOPRIL WAS STOPPED HAS A CHRONIC COUGH AND DID NOT NOTICE IMPROVEMENT OF COUGH WHEN LISINOPRIL WAS STOPPED HAS A CHRONIC COUGH AND DID NOT NOTICE IMPROVEMENT OF COUGH WHEN LISINOPRIL WAS STOPPED   Soap Rash    DIAL soap    Medications   (Not in a hospital admission)     Current Facility-Administered Medications:    acetaminophen (TYLENOL) tablet 650 mg, 650 mg, Oral, Once, Jeanell Sparrow, DO  Current Outpatient Medications:    Accu-Chek Softclix Lancets lancets, Test BS up to 4 times daily Dx E11.69, Disp: 400 each, Rfl: 3   albuterol (VENTOLIN HFA) 108 (90 Base) MCG/ACT inhaler, TAKE 2 PUFFS BY MOUTH EVERY 6 HOURS AS NEEDED FOR WHEEZE OR SHORTNESS OF BREATH (Patient taking differently: Inhale 2 puffs into the lungs every 6 (six)  hours as needed for shortness of breath.), Disp: 54 each, Rfl: 0   alendronate (FOSAMAX) 70 MG tablet, Take 1 tablet (70 mg total) by mouth every 7 (seven) days. Take with a full glass of water on an empty stomach., Disp: 13 tablet, Rfl: 3   ALPRAZolam (XANAX) 1 MG tablet, ! Each morning, one each evening, and 1/2 each afternoon, Disp: 75 tablet, Rfl: 5   amLODipine (NORVASC) 10 MG tablet, TAKE 1 TABLET BY MOUTH EVERY DAY, Disp: 90 tablet, Rfl: 3   aspirin 81 MG chewable tablet, Chew 1 tablet (81 mg total) by mouth daily. X 90 days, Disp: 90 tablet, Rfl: 0   atorvastatin (LIPITOR) 80 MG tablet, Take 1 tablet (80 mg total) by mouth daily., Disp: 90 tablet, Rfl: 2   Blood Glucose Monitoring Suppl (ACCU-CHEK GUIDE) w/Device KIT, Test Bs QID Dx E11.69, Disp: 1 kit, Rfl: 0   clopidogrel (PLAVIX) 75 MG tablet, TAKE 1 TABLET BY MOUTH EVERY DAY WITH BREAKFAST, Disp: 90 tablet, Rfl: 3   dapagliflozin propanediol (FARXIGA) 10 MG TABS tablet, Take 1 tablet (10 mg total) by mouth daily., Disp: 90 tablet, Rfl: 4   ezetimibe (ZETIA) 10 MG tablet, TAKE 1 TABLET BY MOUTH DAILY. FOR CHOLESTEROL, Disp: 90 tablet, Rfl: 3   famotidine (PEPCID) 20 MG tablet, Take 20 mg by mouth at bedtime., Disp: , Rfl:    Fexofenadine HCl (ALLEGRA PO), Take 1 tablet by mouth daily., Disp: , Rfl:    furosemide (LASIX) 20 MG tablet, Take 1 tablet (20 mg total) by mouth daily., Disp: 90 tablet, Rfl: 1   glipiZIDE (GLUCOTROL) 5 MG tablet, Take 1 tablet (5 mg total) by mouth daily before breakfast., Disp: 90 tablet, Rfl: 3   glucose blood (ACCU-CHEK GUIDE) test strip, TEST BLOOD SUGAR 4 TIMES DAILY Dx E11.69, Disp: 400 strip, Rfl: 3   levofloxacin (LEVAQUIN) 500 MG tablet, Take by mouth., Disp: , Rfl:    nitroGLYCERIN (NITROSTAT) 0.4 MG SL tablet, Place 1 tablet (0.4 mg total) under the tongue every 5 (five) minutes as needed for chest pain., Disp: 25 tablet, Rfl:  10   ofloxacin (FLOXIN) 0.3 % OTIC solution, Place 5 drops into both ears 2  (two) times daily., Disp: , Rfl:    predniSONE (DELTASONE) 10 MG tablet, Take by mouth., Disp: , Rfl:    Semaglutide, 2 MG/DOSE, (OZEMPIC, 2 MG/DOSE,) 8 MG/3ML SOPN, Inject 2 mg into the skin once a week., Disp: 9 mL, Rfl: 3   solifenacin (VESICARE) 10 MG tablet, Take 10 mg by mouth daily., Disp: , Rfl:    vitamin B-12 (CYANOCOBALAMIN) 500 MCG tablet, Take 1 tablet (500 mcg total) by mouth daily., Disp: , Rfl:   Vitals   Vitals:   01/23/22 1511 01/23/22 1541  BP: 119/63 116/74  Pulse: 85 75  Resp: 20 18  Temp: 98 F (36.7 C)   TempSrc: Oral   SpO2: 94% 94%     There is no height or weight on file to calculate BMI.  Physical Exam   Exam performed over telemedicine with 2-way video and audio communication and with assistance of bedside RN  Physical Exam Gen: A&O x4, NAD Resp: normal WOB CV: extremities appear well-perfused  Neuro: *MS: A&O x4. Follows multi-step commands.  *Speech: nondysarthric, no aphasia, able to name and repeat *CN: PERRL 12m, EOMI, VFF by confrontation, sensation intact, smile symmetric, hearing intact to voice *Motor:   Normal bulk.  No tremor, rigidity or bradykinesia. No pronator drift. All extremities appear full-strength and symmetric. *Sensory: SILT. Symmetric. No double-simultaneous extinction.  *Coordination:  Finger-to-nose, heel-to-shin, rapid alternating motions were intact. *Reflexes:  UTA 2/2 tele-exam *Gait: deferred  NIHSS = 0   Premorbid mRS = 0   Labs   CBC:  Recent Labs  Lab 01/16/22 1723 01/16/22 1727 01/23/22 1524  WBC 5.4  --  7.1  NEUTROABS 4.6  --  3.6  HGB 13.8 14.3 13.7  HCT 40.4 42.0 40.4  MCV 87.1  --  87.8  PLT 223  --  2151   Basic Metabolic Panel:  Lab Results  Component Value Date   NA 137 01/17/2022   K 3.5 01/17/2022   CO2 25 01/17/2022   GLUCOSE 243 (H) 01/17/2022   BUN 19 01/17/2022   CREATININE 0.69 01/17/2022   CALCIUM 9.2 01/17/2022   GFRNONAA >60 01/17/2022   GFRAA 65 11/02/2020    Lipid Panel:  Lab Results  Component Value Date   LDLCALC 132 (H) 01/17/2022   HgbA1c:  Lab Results  Component Value Date   HGBA1C 7.9 (H) 01/17/2022   Urine Drug Screen:     Component Value Date/Time   LABOPIA NONE DETECTED 01/16/2022 1947   COCAINSCRNUR NONE DETECTED 01/16/2022 1947   LABBENZ NONE DETECTED 01/16/2022 1947   AMPHETMU NONE DETECTED 01/16/2022 1947   THCU NONE DETECTED 01/16/2022 1947   LABBARB NONE DETECTED 01/16/2022 1947    Alcohol Level     Component Value Date/Time   ETH <10 01/16/2022 1723     Impression   This is a 71yo woman with hx anxiety, HTN, HL, DM2, recent admission for intermittent R sided numbness and tingling (one week ago) with pan-negative workup including normal EEG who presented to EEG with numbness and tingling of R arm and leg LKW 1330. Her sx completely resolved during the telestroke and head CT was normal.   Recommendations   - No indication for admission; sx completely resolved and she had full TIA workup and EEG last week which were all unremarkable - No changes to outpatient antiplatelet regimen - Patient can f/u with established outpatient  neurologist ______________________________________________________________________   Thank you for the opportunity to take part in the care of this patient. If you have any further questions, please contact the neurology consultation attending.  Signed,  Su Monks, MD [facility] [phone]  If 7pm- 7am, please page neurology on call as listed in [place].

## 2022-01-23 NOTE — Progress Notes (Signed)
Dr Quinn Axe on camera to assess patient @ 1509.  Reviewed recent admission, VS, known PMH and anticoagulation status.

## 2022-01-24 ENCOUNTER — Encounter: Payer: Self-pay | Admitting: Internal Medicine

## 2022-01-24 ENCOUNTER — Ambulatory Visit (INDEPENDENT_AMBULATORY_CARE_PROVIDER_SITE_OTHER): Payer: Medicare HMO

## 2022-01-24 ENCOUNTER — Other Ambulatory Visit: Payer: Self-pay | Admitting: Internal Medicine

## 2022-01-24 ENCOUNTER — Ambulatory Visit: Payer: Medicare HMO | Admitting: Internal Medicine

## 2022-01-24 VITALS — BP 120/74 | HR 76 | Ht 62.0 in | Wt 173.0 lb

## 2022-01-24 DIAGNOSIS — Z8673 Personal history of transient ischemic attack (TIA), and cerebral infarction without residual deficits: Secondary | ICD-10-CM | POA: Insufficient documentation

## 2022-01-24 DIAGNOSIS — I2510 Atherosclerotic heart disease of native coronary artery without angina pectoris: Secondary | ICD-10-CM

## 2022-01-24 DIAGNOSIS — I639 Cerebral infarction, unspecified: Secondary | ICD-10-CM

## 2022-01-24 DIAGNOSIS — I700 Atherosclerosis of aorta: Secondary | ICD-10-CM | POA: Diagnosis not present

## 2022-01-24 MED ORDER — NEXLIZET 180-10 MG PO TABS: 180.0000 mg | ORAL_TABLET | Freq: Every day | ORAL | 6 refills | Status: DC

## 2022-01-24 NOTE — Patient Instructions (Addendum)
Medication Instructions:  ?Start Nexlizet 108-10 mg tablets daily ?Stop Zetia  ? ?Follow-Up: ?Follow up in the Summer/Fall with Dr. Gasper Sells or APP ? ?Any Other Special Instructions Will Be Listed Below (If Applicable). ? ? ? ? ?If you need a refill on your cardiac medications before your next appointment, please call your pharmacy. ? ? ?ZIO XT- Long Term Monitor Instructions  ? ?Your physician has requested you wear your ZIO patch monitor___14____days.  ? ?This is a single patch monitor.  [employer] supplies one patch monitor per enrollment.  Additional stickers are not available. ?  ?Please do not apply patch if you will be having a Nuclear Stress Test, Echocardiogram, Cardiac CT, MRI, or Chest Xray during the time frame you would be wearing the monitor. The patch cannot be worn during these tests.  You cannot remove and re-apply the ZIO XT patch monitor. ?  ?Your ZIO patch monitor will be sent USPS Priority mail from [facility] directly to your home address. The monitor may also be mailed to a PO BOX if home delivery is not available.   It may take 3-5 days to receive your monitor after you have been enrolled. ?  ?Once you have received you monitor, please review enclosed instructions.  Your monitor has already been registered assigning a specific monitor serial # to you. ?  ?Applying the monitor  ? ?Shave hair from upper left chest. ?  ?Hold abrader disc by orange tab.  Rub abrader in 40 strokes over left upper chest as indicated in your monitor instructions. ?  ?Clean area with 4 enclosed alcohol pads .  Use all pads to assure are is cleaned thoroughly.  Let dry.  ? ?Apply patch as indicated in monitor instructions.  Patch will be place under collarbone on left side of chest with arrow pointing upward. ?  ?Rub patch adhesive wings for 2 minutes.Remove white label marked "1".  Remove white label marked "2".  Rub patch adhesive wings for 2 additional minutes. ?  ?While looking in a mirror, press and  release button in center of patch.  A small green light will flash 3-4 times .  This will be your only indicator the monitor has been turned on. ?    ?Do not shower for the first 24 hours.  You may shower after the first 24 hours. ?  ?Press button if you feel a symptom. You will hear a small click.  Record Date, Time and Symptom in the Patient Log Book. ?  ?When you are ready to remove patch, follow instructions on last 2 pages of Patient Log Book.  Stick patch monitor onto last page of Patient Log Book. ?  ?Place Patient Log Book in [facility] box.  Use locking tab on box and tape box closed securely.  The [employer] and [employer] has [employer] on it.  Please place in mailbox as soon as possible.  Your physician should have your test results approximately 7 days after the monitor has been mailed back to [facility]. ?  ?Call [facility] at [phone] if you have questions regarding your ZIO XT patch monitor.  Call them immediately if you see an orange light blinking on your monitor. ?  ?If your monitor falls off in less than 4 days contact our Monitor department at [phone].  If your monitor becomes loose or falls off after 4 days call [employer] at [phone] for suggestions on securing your monitor.  ? ?

## 2022-01-24 NOTE — Progress Notes (Signed)
Cardiology Office Note:    Date:  01/24/2022   ID:  Yvonne [name], DOB [birth year], MRN [id number]  PCP:  [name], [place]  Cardiologist:  None  Advanced Practice Provider:  No care team member to display Electrophysiologist:  None      CC: ED f/u  History of Present Illness:    Yvonne [name] is a 71 y.o. female with a hx of HTN with DM orthostatic hypotension, HFpEF NOS, Chronic Migraine, GERD, HLD with DM and Aortic atherosclerosis, stoke with prior lacunar infarcts 01/03/21, CKD IIIa seen 2022 for CP found to have moderate non obstructive CAD.  Lost to follow up.  Has had two CODE Strokes called since 01/16/22 for R arm tingling.  Patient notes that she is feeling pretty tired.   Is on ASA and plavix with plans for 90 days of DAPT. R sided tingling and weakness persists. LDL at hospital eval 132 Echo showed Grade 1 Diastolic dysfunction   No chest pain or pressure .  No SOB/DOE and no PND/Orthopnea.  No weight gain or notes .  No palpitations or syncope    Past Medical History:  Diagnosis Date   Anxiety    Arthritis    left hand   Asthma    daily and prn inhalers   Chronic cough    Chronic otitis media 12/2017   Full dentures    GERD (gastroesophageal reflux disease)    History of stroke 09/2017   weakness right hand, numbness right side face   Hyperlipidemia    Hypertension    states under control with meds., has been on med. x a long time, per pt.   Non-insulin dependent type 2 diabetes mellitus ([place])    Overactive bladder    Recurrent acute suppurative otitis media without spontaneous rupture of left tympanic membrane 02/19/2018   Stroke ([place])    Transient cerebral ischemia     Past Surgical History:  Procedure Laterality Date   ABDOMINAL HYSTERECTOMY     complete   CATARACT EXTRACTION W/ INTRAOCULAR LENS IMPLANT Right    CHOLECYSTECTOMY     COLONOSCOPY N/A 08/17/2015   Procedure: COLONOSCOPY;  Surgeon: Rogene Houston,  MD;  [facility];  Service: Endoscopy;  Laterality: N/A;  200 - moved to 7:30 - Ann notified pt   GAS INSERTION Right    x 2 - eye   LACRIMAL DUCT EXPLORATION Bilateral    removal of tear ducts   MYRINGOTOMY WITH TUBE PLACEMENT Bilateral 01/28/2018   Procedure: BILATERAL MYRINGOTOMY WITH TUBE PLACEMENT;  Surgeon: Leta Baptist, MD;  Location: [facility];  Service: ENT;  Laterality: Bilateral;   VITRECTOMY Left 09/14/2015    Current Medications: Current Meds  Medication Sig   Accu-Chek Softclix Lancets lancets Test BS up to 4 times daily Dx E11.69   albuterol (VENTOLIN HFA) 108 (90 Base) MCG/ACT inhaler TAKE 2 PUFFS BY MOUTH EVERY 6 HOURS AS NEEDED FOR WHEEZE OR SHORTNESS OF BREATH (Patient taking differently: Inhale 2 puffs into the lungs every 6 (six) hours as needed for shortness of breath.)   alendronate (FOSAMAX) 70 MG tablet Take 1 tablet (70 mg total) by mouth every 7 (seven) days. Take with a full glass of water on an empty stomach.   ALPRAZolam (XANAX) 1 MG tablet ! Each morning, one each evening, and 1/2 each afternoon   amLODipine (NORVASC) 10 MG tablet TAKE 1 TABLET BY MOUTH EVERY DAY  aspirin 81 MG chewable tablet Chew 1 tablet (81 mg total) by mouth daily. X 90 days   atorvastatin (LIPITOR) 80 MG tablet Take 1 tablet (80 mg total) by mouth daily.   Bempedoic Acid-Ezetimibe (NEXLIZET) 180-10 MG TABS Take 180 mg by mouth daily.   Blood Glucose Monitoring Suppl (ACCU-CHEK GUIDE) w/Device KIT Test Bs QID Dx E11.69   clopidogrel (PLAVIX) 75 MG tablet TAKE 1 TABLET BY MOUTH EVERY DAY WITH BREAKFAST   dapagliflozin propanediol (FARXIGA) 10 MG TABS tablet Take 1 tablet (10 mg total) by mouth daily.   famotidine (PEPCID) 20 MG tablet Take 20 mg by mouth at bedtime.   Fexofenadine HCl (ALLEGRA PO) Take 1 tablet by mouth daily.   furosemide (LASIX) 20 MG tablet Take 1 tablet (20 mg total) by mouth daily.   glipiZIDE (GLUCOTROL) 5 MG tablet Take 1 tablet (5 mg total)  by mouth daily before breakfast.   glucose blood (ACCU-CHEK GUIDE) test strip TEST BLOOD SUGAR 4 TIMES DAILY Dx E11.69   nitroGLYCERIN (NITROSTAT) 0.4 MG SL tablet Place 1 tablet (0.4 mg total) under the tongue every 5 (five) minutes as needed for chest pain.   ofloxacin (FLOXIN) 0.3 % OTIC solution Place 5 drops into both ears 2 (two) times daily.   oxybutynin (DITROPAN-XL) 5 MG 24 hr tablet Take 5 mg by mouth at bedtime.   Semaglutide, 2 MG/DOSE, (OZEMPIC, 2 MG/DOSE,) 8 MG/3ML SOPN Inject 2 mg into the skin once a week.   vitamin B-12 (CYANOCOBALAMIN) 500 MCG tablet Take 1 tablet (500 mcg total) by mouth daily.   [DISCONTINUED] ezetimibe (ZETIA) 10 MG tablet TAKE 1 TABLET BY MOUTH DAILY. FOR CHOLESTEROL     Allergies:   Penicillins, Gabapentin, Lisinopril, and Soap   Social History   Socioeconomic History   Marital status: Married    Spouse name: Not on file   Number of children: 3   Years of education: Not on file   Highest education level: GED or equivalent  Occupational History   Occupation: retired    Comment: Customer service manager and restaurants  Tobacco Use   Smoking status: Never   Smokeless tobacco: Never  Scientific laboratory technician Use: Never used  Substance and Sexual Activity   Alcohol use: Not Currently   Drug use: No   Sexual activity: Yes  Other Topics Concern   Not on file  Social History Narrative   Lives at home home with husband with son, daughter in law and 3 children.  [name], his girlfriend and her child also moved in 10/2018      Disabled.   Social Determinants of Health   Financial Resource Strain: Low Risk    Difficulty of Paying Living Expenses: Not hard at all  Food Insecurity: No Food Insecurity   Worried About Charity fundraiser in the Last Year: Never true   [place] in the Last Year: Never true  Transportation Needs: No Transportation Needs   Lack of Transportation (Medical): No   Lack of Transportation (Non-Medical): No  Physical  Activity: Sufficiently Active   Days of Exercise per Week: 7 days   Minutes of Exercise per Session: 50 min  Stress: Stress Concern Present   Feeling of Stress : Rather much  Social Connections: Moderately Isolated   Frequency of Communication with Friends and Family: More than three times a week   Frequency of Social Gatherings with Friends and Family: More than three times a week   Attends Religious Services:  Never   Active Member of Clubs or Organizations: No   Attends Archivist Meetings: Never   Marital Status: Married    Social :  Had three grandkids: 2 boys, one girl  Family History: The patient's family history includes Arthritis in her brother and mother; Arthritis-Osteo in her son; Asthma in her father; CVA in her mother; Cancer in her brother; Diabetes in her father and mother; Heart disease in her father; Hepatitis C in her sister; Peripheral Artery Disease in her daughter; Stroke in her mother.  ROS:   Please see the history of present illness.     All other systems reviewed and are negative.  EKGs/Labs/Other Studies Reviewed:    The following studies were reviewed today:  EKG:   01/20/21: SR 69 anterior infarct, inferior infarct pattern 06/11/2019: SR 69 Nonspecific TWI  Recent Labs: 01/17/2022: TSH 0.766 01/23/2022: ALT 44; BUN 25; Creatinine, Ser 1.01; Hemoglobin 13.7; Platelets 235; Potassium 3.9; Sodium 137  Recent Lipid Panel    Component Value Date/Time   CHOL 206 (H) 01/17/2022 0444   CHOL 147 11/30/2021 1450   TRIG 57 01/17/2022 0444   HDL 63 01/17/2022 0444   HDL 49 11/30/2021 1450   CHOLHDL 3.3 01/17/2022 0444   VLDL 11 01/17/2022 0444   LDLCALC 132 (H) 01/17/2022 0444   LDLCALC 66 11/30/2021 1450   Non-Cardiac CT: Date: 05/25/2016 Personally Reviewed Results: Aortic Atherosclerosis without coronary calcifications  Transthoracic Echocardiogram: Date: 06/11/2019 Results: 1. The left ventricle has normal systolic function with an ejection   fraction of 60-65%. The cavity size was normal. There is moderately  increased left ventricular wall thickness. Left ventricular diastolic  Doppler parameters are consistent with impaired   relaxation. Elevated mean left atrial pressure.   2. The right ventricle has normal systolic function. The cavity was  normal. There is no increase in right ventricular wall thickness.   3. Left atrial size was mildly dilated.   4. No evidence of mitral valve stenosis.   5. The aortic valve has an indeterminate number of cusps. No stenosis of  the aortic valve.   6. The aortic root is normal in size and structure.   7. Pulmonary hypertension is indeterminate, inadequate TR jet.   NM Stress Testing : Date: 08/13/2016 Results: Report only from [facility]:  Normal function with perfusion defect    Physical Exam:    VS:  BP 120/74    Pulse 76    Ht _0  (1.575 m)    Wt 173 lb (78.5 kg)    SpO2 96%    BMI 31.64 kg/m      Wt Readings from Last 3 Encounters:  01/24/22 173 lb (78.5 kg)  01/16/22 180 lb 12.4 oz (82 kg)  12/13/21 177 lb (80.3 kg)    Gen: No distress   Neck: No JVD,  Ears: bilateral Frank Sign Cardiac: No Rubs or Gallops, no Murmur, regular rate, +2 radial pulses Respiratory: Clear to auscultation bilaterally, normal effort, normal  respiratory rate GI: Soft, nontender, non-distended  MS: trace bilateral non pitting edema;  moves all extremities Integument: Skin feels warm Neuro:  At time of evaluation, alert and oriented to person/place/time/situation, decreased strength and cessation R hand Psych: Normal affect, patient feels OK   ASSESSMENT:    1. Aortic atherosclerosis ([place])   2. History of stroke   3. Coronary artery disease involving native coronary artery of native heart without angina pectoris     PLAN:    Moderate non  obstructive CAD (FFR negative; LAD) Recent strokes HLD and Aortic Atheroscleris Hx of orthostatic hypotension - reviewed echo with patient - DAPT as  per neurology - will continue statin - LDL goal < 55 given multiple stroke events, attempting to get patient on Nexlizet (would stop zetia); and recheck in three months - will do ziopatch for AF assessment given stroke - no CP   Summer fall follow up me or APP        Medication Adjustments/Labs and Tests Ordered: Current medicines are reviewed at length with the patient today.  Concerns regarding medicines are outlined above.  No orders of the defined types were placed in this encounter.  Meds ordered this encounter  Medications   Bempedoic Acid-Ezetimibe (NEXLIZET) 180-10 MG TABS    Sig: Take 180 mg by mouth daily.    Dispense:  30 tablet    Refill:  6    Patient Instructions  Medication Instructions:  Start Nexlizet 108-10 mg tablets daily Stop Zetia   Follow-Up: Follow up in the Summer/Fall with Dr. Gasper Sells or APP  Any Other Special Instructions Will Be Listed Below (If Applicable).     If you need a refill on your cardiac medications before your next appointment, please call your pharmacy.   ZIO XT- Long Term Monitor Instructions   Your physician has requested you wear your ZIO patch monitor___14____days.   This is a single patch monitor.  [employer] supplies one patch monitor per enrollment.  Additional stickers are not available.   Please do not apply patch if you will be having a Nuclear Stress Test, Echocardiogram, Cardiac CT, MRI, or Chest Xray during the time frame you would be wearing the monitor. The patch cannot be worn during these tests.  You cannot remove and re-apply the ZIO XT patch monitor.   Your ZIO patch monitor will be sent USPS Priority mail from [facility] directly to your home address. The monitor may also be mailed to a PO BOX if home delivery is not available.   It may take 3-5 days to receive your monitor after you have been enrolled.   Once you have received you monitor, please review enclosed instructions.  Your monitor has  already been registered assigning a specific monitor serial # to you.   Applying the monitor   Shave hair from upper left chest.   Hold abrader disc by orange tab.  Rub abrader in 40 strokes over left upper chest as indicated in your monitor instructions.   Clean area with 4 enclosed alcohol pads .  Use all pads to assure are is cleaned thoroughly.  Let dry.   Apply patch as indicated in monitor instructions.  Patch will be place under collarbone on left side of chest with arrow pointing upward.   Rub patch adhesive wings for 2 minutes.Remove white label marked "1".  Remove white label marked "2".  Rub patch adhesive wings for 2 additional minutes.   While looking in a mirror, press and release button in center of patch.  A small green light will flash 3-4 times .  This will be your only indicator the monitor has been turned on.     Do not shower for the first 24 hours.  You may shower after the first 24 hours.   Press button if you feel a symptom. You will hear a small click.  Record Date, Time and Symptom in the Patient Log Book.   When you are ready to remove patch, follow instructions on last  2 pages of Patient Log Book.  Stick patch monitor onto last page of Patient Log Book.   Place Patient Log Book in [place] box.  Use locking tab on box and tape box closed securely.  The [employer] and [employer] has [employer] on it.  Please place in mailbox as soon as possible.  Your physician should have your test results approximately 7 days after the monitor has been mailed back to [facility].   Call [facility] at [phone] if you have questions regarding your ZIO XT patch monitor.  Call them immediately if you see an orange light blinking on your monitor.   If your monitor falls off in less than 4 days contact our Monitor department at [phone].  If your monitor becomes loose or falls off after 4 days call [employer] at [phone] for suggestions on securing your  monitor.     Signed, Werner Lean, MD  01/24/2022 10:41 AM    [place]

## 2022-01-25 ENCOUNTER — Telehealth: Payer: Self-pay

## 2022-01-25 NOTE — Telephone Encounter (Signed)
Prior authorization for Nexlizet 180-10 mg tablets approved through [facility] until 11/25/2022.  ?

## 2022-01-30 ENCOUNTER — Encounter: Payer: Self-pay | Admitting: Family Medicine

## 2022-01-30 ENCOUNTER — Ambulatory Visit (INDEPENDENT_AMBULATORY_CARE_PROVIDER_SITE_OTHER): Payer: Medicare HMO | Admitting: Family Medicine

## 2022-01-30 VITALS — BP 112/69 | HR 77 | Temp 97.9°F | Ht 62.0 in | Wt 174.4 lb

## 2022-01-30 DIAGNOSIS — I69998 Other sequelae following unspecified cerebrovascular disease: Secondary | ICD-10-CM

## 2022-01-30 DIAGNOSIS — R531 Weakness: Secondary | ICD-10-CM | POA: Diagnosis not present

## 2022-01-30 NOTE — Progress Notes (Signed)
Subjective:  Patient ID: Yvonne [name], female    DOB: May 03, 1951  Age: 71 y.o. MRN: [id number]  CC: Transitions Of Care   HPI Yvonne [name] presents for hospital follow-up.  She was admitted about 2 weeks ago for apparent lacunar infarct.  MRI showed some evidence for residual micro hemorrhage in addition to small right cerebellar infarct and chronic lacunar infarctions. She has mild RUE weakness, but is ambulatory. Right grip is weak compared to left. She is right handed. Blood glucose was high at admission.  Better at DC. A1c on 2/22 at admission was 7.9.  Depression screen [facility] 2/9 01/30/2022 01/30/2022 01/03/2022  Decreased Interest 3 0 1  Down, Depressed, Hopeless 3 0 1  PHQ - 2 Score 6 0 2  Altered sleeping 2 - 2  Tired, decreased energy 3 - 2  Change in appetite 2 - 1  Feeling bad or failure about yourself  2 - 1  Trouble concentrating 3 - 1  Moving slowly or fidgety/restless 1 - 2  Suicidal thoughts 0 - 0  PHQ-9 Score 19 - 11  Difficult doing work/chores Somewhat difficult - Somewhat difficult  Some recent data might be hidden    History Ever has a past medical history of Anxiety, Arthritis, Asthma, Chronic cough, Chronic otitis media (12/2017), Full dentures, GERD (gastroesophageal reflux disease), History of stroke (09/2017), Hyperlipidemia, Hypertension, Non-insulin dependent type 2 diabetes mellitus ([facility]), Overactive bladder, Recurrent acute suppurative otitis media without spontaneous rupture of left tympanic membrane (02/19/2018), Stroke ([facility]), and Transient cerebral ischemia.   She has a past surgical history that includes Cholecystectomy; Colonoscopy (N/A, 08/17/2015); Abdominal hysterectomy; Lacrimal duct exploration (Bilateral); Cataract extraction w/ intraocular lens implant (Right); Vitrectomy (Left, 09/14/2015); Gas insertion (Right); and Myringotomy with tube placement (Bilateral, 01/28/2018).   Her family history includes Arthritis in her brother and mother;  Arthritis-Osteo in her son; Asthma in her father; CVA in her mother; Cancer in her brother; Diabetes in her father and mother; Heart disease in her father; Hepatitis C in her sister; Peripheral Artery Disease in her daughter; Stroke in her mother.She reports that she has never smoked. She has never used smokeless tobacco. She reports that she does not currently use alcohol. She reports that she does not use drugs.    ROS Review of Systems  Constitutional: Negative.   HENT: Negative.    Eyes:  Negative for visual disturbance.  Respiratory:  Negative for shortness of breath.   Cardiovascular:  Negative for chest pain.  Gastrointestinal:  Negative for abdominal pain.  Musculoskeletal:  Negative for arthralgias.  Neurological:  Positive for weakness (right sided).   Objective:  BP 112/69   Pulse 77   Temp 97.9 F (36.6 C)   Ht 5\' 2"  (1.575 m)   Wt 174 lb 6.4 oz (79.1 kg)   SpO2 97%   BMI 31.90 kg/m   BP Readings from Last 3 Encounters:  01/30/22 112/69  01/24/22 120/74  01/23/22 126/84    Wt Readings from Last 3 Encounters:  01/30/22 174 lb 6.4 oz (79.1 kg)  01/24/22 173 lb (78.5 kg)  01/16/22 180 lb 12.4 oz (82 kg)     Physical Exam Constitutional:      General: She is not in acute distress.    Appearance: She is well-developed.  HENT:     Head: Normocephalic and atraumatic.  Eyes:     Conjunctiva/sclera: Conjunctivae normal.     Pupils: Pupils are equal, round, and reactive to light.  Neck:  Thyroid: No thyromegaly.  Cardiovascular:     Rate and Rhythm: Normal rate and regular rhythm.     Heart sounds: Normal heart sounds. No murmur heard. Pulmonary:     Effort: Pulmonary effort is normal. No respiratory distress.     Breath sounds: Normal breath sounds. No wheezing or rales.  Abdominal:     General: Bowel sounds are normal. There is no distension.     Palpations: Abdomen is soft.     Tenderness: There is no abdominal tenderness.  Musculoskeletal:      Cervical back: Normal range of motion and neck supple.     Comments: Decreased abduction of right shoulder to 75 degrees. Right grip is 4/5. Left is 5/5.   Lymphadenopathy:     Cervical: No cervical adenopathy.  Skin:    General: Skin is warm and dry.  Neurological:     Mental Status: She is alert and oriented to person, place, and time.  Psychiatric:        Behavior: Behavior normal.        Thought Content: Thought content normal.        Judgment: Judgment normal.      Assessment & Plan:   Yvonne [name] was seen today for transitions of care.  Diagnoses and all orders for this visit:  Weakness as late effect of cerebrovascular accident (CVA) -     Ambulatory referral to Neurology -     Ambulatory referral to Physical Therapy    I have discontinued Lawernce Keas. Gfeller's solifenacin and predniSONE. I am also having her maintain her Accu-Chek Softclix Lancets, furosemide, atorvastatin, nitroGLYCERIN, famotidine, ofloxacin, Accu-Chek Guide, alendronate, amLODipine, glipiZIDE, clopidogrel, Ozempic (2 MG/DOSE), ALPRAZolam, albuterol, dapagliflozin propanediol, Fexofenadine HCl (ALLEGRA PO), vitamin B-12, aspirin, oxybutynin, and Nexlizet.  Allergies as of 01/30/2022       Reactions   Penicillins Hives, Itching   Has patient had a PCN reaction causing immediate rash, facial/tongue/throat swelling, SOB or lightheadedness with hypotension: Yes Has patient had a PCN reaction causing severe rash involving mucus membranes or skin necrosis: Unk Has patient had a PCN reaction that required hospitalization: No Has patient had a PCN reaction occurring within the last 10 years: No If all of the above answers are "NO", then may proceed with Cephalosporin use. Has patient had a PCN reaction causing immediate rash, facial/tongue/throat swelling, SOB or lightheadedness with hypotension: Yes Has patient had a PCN reaction causing severe rash involving mucus membranes or skin necrosis: No Has patient had a PCN  reaction that required hospitalization No Has patient had a PCN reaction occurring within the last 10 years: No If all of the above answers are "NO", then may proceed with Cephalosporin use. Has patient had a PCN reaction causing immediate rash, facial/tongue/throat swelling, SOB or lightheadedness with hypotension: Yes Has patient had a PCN reaction causing severe rash involving mucus membranes or skin necrosis: Unk Has patient had a PCN reaction that required hospitalization: No Has patient had a PCN reaction occurring within the last 10 years: No If all of the above answers are "NO", then may proceed with Cephalosporin use. Has patient had a PCN reaction causing immediate rash, facial/tongue/throat swelling, SOB or lightheadedness with hypotension: Yes Has patient had a PCN reaction causing severe rash involving mucus membranes or skin necrosis: No Has patient had a PCN reaction that required hospitalization No Has patient had a PCN reaction occurring within the last 10 years: No If all of the above answers are "NO", then may proceed with  Cephalosporin use. Has patient had a PCN reaction causing immediate rash, facial/tongue/throat swelling, SOB or lightheadedness with hypotension: Yes Has patient had a PCN reaction causing sev... (TRUNCATED)   Gabapentin Other (See Comments)   Causes a lot of lethargy at a higher dose Causes a lot of lethargy at a higher dose Causes a lot of lethargy at a higher dose   Lisinopril Cough   HAS A CHRONIC COUGH AND DID NOT NOTICE IMPROVEMENT OF COUGH WHEN LISINOPRIL WAS STOPPED HAS A CHRONIC COUGH AND DID NOT NOTICE IMPROVEMENT OF COUGH WHEN LISINOPRIL WAS STOPPED HAS A CHRONIC COUGH AND DID NOT NOTICE IMPROVEMENT OF COUGH WHEN LISINOPRIL WAS STOPPED   Soap Rash   DIAL soap        Medication List        Accurate as of January 30, 2022  3:10 PM. If you have any questions, ask your nurse or doctor.          STOP taking these medications     Accu-Chek Guide test strip Generic drug: glucose blood Stopped by: Mechele Claude, MD   predniSONE 10 MG tablet Commonly known as: DELTASONE Stopped by: Mechele Claude, MD   solifenacin 10 MG tablet Commonly known as: VESICARE Stopped by: Mechele Claude, MD       TAKE these medications    Accu-Chek Guide w/Device Kit Test Bs QID Dx E11.69   Accu-Chek Softclix Lancets lancets Test BS up to 4 times daily Dx E11.69   albuterol 108 (90 Base) MCG/ACT inhaler Commonly known as: VENTOLIN HFA TAKE 2 PUFFS BY MOUTH EVERY 6 HOURS AS NEEDED FOR WHEEZE OR SHORTNESS OF BREATH What changed: See the new instructions.   alendronate 70 MG tablet Commonly known as: FOSAMAX Take 1 tablet (70 mg total) by mouth every 7 (seven) days. Take with a full glass of water on an empty stomach.   ALLEGRA PO Take 1 tablet by mouth daily.   ALPRAZolam 1 MG tablet Commonly known as: XANAX ! Each morning, one each evening, and 1/2 each afternoon   amLODipine 10 MG tablet Commonly known as: NORVASC TAKE 1 TABLET BY MOUTH EVERY DAY   aspirin 81 MG chewable tablet Chew 1 tablet (81 mg total) by mouth daily. X 90 days   atorvastatin 80 MG tablet Commonly known as: LIPITOR Take 1 tablet (80 mg total) by mouth daily.   clopidogrel 75 MG tablet Commonly known as: PLAVIX TAKE 1 TABLET BY MOUTH EVERY DAY WITH BREAKFAST   dapagliflozin propanediol 10 MG Tabs tablet Commonly known as: FARXIGA Take 1 tablet (10 mg total) by mouth daily.   famotidine 20 MG tablet Commonly known as: PEPCID Take 20 mg by mouth at bedtime.   furosemide 20 MG tablet Commonly known as: LASIX Take 1 tablet (20 mg total) by mouth daily.   glipiZIDE 5 MG tablet Commonly known as: GLUCOTROL Take 1 tablet (5 mg total) by mouth daily before breakfast.   Nexlizet 180-10 MG Tabs Generic drug: Bempedoic Acid-Ezetimibe Take 180 mg by mouth daily.   nitroGLYCERIN 0.4 MG SL tablet Commonly known as: NITROSTAT Place 1  tablet (0.4 mg total) under the tongue every 5 (five) minutes as needed for chest pain.   ofloxacin 0.3 % OTIC solution Commonly known as: FLOXIN Place 5 drops into both ears 2 (two) times daily.   oxybutynin 5 MG 24 hr tablet Commonly known as: DITROPAN-XL Take 5 mg by mouth at bedtime.   Ozempic (2 MG/DOSE) 8 MG/3ML Sopn Generic drug: Semaglutide (  2 MG/DOSE) Inject 2 mg into the skin once a week.   vitamin B-12 500 MCG tablet Commonly known as: CYANOCOBALAMIN Take 1 tablet (500 mcg total) by mouth daily.         Follow-up: Return in about 3 months (around 05/02/2022).  Mechele Claude, M.D.

## 2022-01-31 ENCOUNTER — Encounter: Payer: Self-pay | Admitting: Neurology

## 2022-01-31 ENCOUNTER — Telehealth: Payer: Self-pay | Admitting: Family Medicine

## 2022-01-31 NOTE — Telephone Encounter (Signed)
Pt called stating that [facility] cant see pt until June. Pt says she called [facility] and was told that they could see her a lot sooner than June but needs [place] to send them the referral.  ?

## 2022-02-01 ENCOUNTER — Other Ambulatory Visit: Payer: Self-pay

## 2022-02-01 ENCOUNTER — Ambulatory Visit: Payer: Medicare HMO | Attending: Family Medicine

## 2022-02-01 DIAGNOSIS — M6281 Muscle weakness (generalized): Secondary | ICD-10-CM | POA: Diagnosis not present

## 2022-02-01 DIAGNOSIS — R531 Weakness: Secondary | ICD-10-CM | POA: Diagnosis not present

## 2022-02-01 DIAGNOSIS — I69998 Other sequelae following unspecified cerebrovascular disease: Secondary | ICD-10-CM | POA: Insufficient documentation

## 2022-02-01 NOTE — Therapy (Signed)
[facility] [place], [place], [address] Phone: [phone]   Fax:  [phone]  Physical Therapy Evaluation  Patient Details  Name: Yvonne [name] MRN: [id number] Date of Birth: 1951-07-19 Referring Provider (PT): [name], MD   Encounter Date: 02/01/2022   PT End of Session - 02/01/22 0904     Visit Number 72    Number of Visits 72    Date for PT Re-Evaluation 03/23/22    PT Start Time 0904    PT Stop Time 0938    PT Time Calculation (min) 34 min    Activity Tolerance Patient tolerated treatment well    Behavior During Therapy [facility] for tasks assessed/performed             Past Medical History:  Diagnosis Date   Anxiety    Arthritis    left hand   Asthma    daily and prn inhalers   Chronic cough    Chronic otitis media 12/2017   Full dentures    GERD (gastroesophageal reflux disease)    History of stroke 09/2017   weakness right hand, numbness right side face   Hyperlipidemia    Hypertension    states under control with meds., has been on med. x a long time, per pt.   Non-insulin dependent type 2 diabetes mellitus ([place])    Overactive bladder    Recurrent acute suppurative otitis media without spontaneous rupture of left tympanic membrane 02/19/2018   Stroke ([place])    Transient cerebral ischemia     Past Surgical History:  Procedure Laterality Date   ABDOMINAL HYSTERECTOMY     complete   CATARACT EXTRACTION W/ INTRAOCULAR LENS IMPLANT Right    CHOLECYSTECTOMY     COLONOSCOPY N/A 08/17/2015   Procedure: COLONOSCOPY;  Surgeon: Rogene Houston, MD;  [facility];  Service: Endoscopy;  Laterality: N/A;  200 - moved to 7:30 - Ann notified pt   GAS INSERTION Right    x 2 - eye   LACRIMAL DUCT EXPLORATION Bilateral    removal of tear ducts   MYRINGOTOMY WITH TUBE PLACEMENT Bilateral 01/28/2018   Procedure: BILATERAL MYRINGOTOMY WITH TUBE PLACEMENT;  Surgeon: Leta Baptist, MD;  Location: [facility];  Service: ENT;  Laterality: Bilateral;   VITRECTOMY Left 09/14/2015    There were no vitals filed for this visit.    Subjective Assessment - 02/01/22 0905     Subjective Patient reports that she had her first stroke in 2018 or 2019, but had another one about two weeks ago. She notes that she developed a limp after this most recent stroke, but it is getting better. She notes that the only time she has fell was when she had this last stroke. She notes that she is able to do everything she wants to and needs to be able to do. She has some numbness in her right hand from her first stroke.    Pertinent History history of multiple strokes    Patient Stated Goals increased strength on the right side    Currently in Pain? No/denies                [facility] PT Assessment - 02/01/22 0001       Assessment   Medical Diagnosis Weakness as late effect of CVA    Referring Provider (PT) [name], MD    Onset Date/Surgical Date --   2 weeks ago   Next MD Visit 72/8/23  Prior Therapy Yes      Precautions   Precautions None      Restrictions   Weight Bearing Restrictions No      Balance Screen   Has the patient fallen in the past 6 months Yes    How many times? 1    Has the patient had a decrease in activity level because of a fear of falling?  No    Is the patient reluctant to leave their home because of a fear of falling?  No      Home Ecologist residence    Home Access Stairs to enter    Entrance Stairs-Number of Steps 7    Entrance Stairs-Rails Can reach both    [place] One level      Prior Function   Level of Independence Independent    Leisure reading      Cognition   Overall Cognitive Status Within Functional Limits for tasks assessed    Attention Focused    Focused Attention Appears intact    Memory Appears intact    Awareness Appears intact    Problem Solving Appears intact      Sensation   Additional Comments Patient reports  numbness in her right hand      ROM / Strength   AROM / PROM / Strength Strength;AROM      AROM   Overall AROM  Within functional limits for tasks performed      Strength   Strength Assessment Site Hip;Knee;Ankle;Shoulder;Elbow    Right/Left Shoulder Right;Left    Right Shoulder Flexion 4-/5    Right Shoulder ABduction 4-/5    Right Shoulder Internal Rotation 4-/5    Right Shoulder External Rotation 4-/5    Left Shoulder Flexion 4/5    Left Shoulder ABduction 4-/5    Left Shoulder Internal Rotation 4/5    Left Shoulder External Rotation 4-/5    Right/Left Elbow Right;Left    Right Elbow Flexion 4/5    Right Elbow Extension 4/5    Left Elbow Flexion 4/5    Left Elbow Extension 4/5    Right/Left Hip Right;Left    Right Hip Flexion 4-/5    Left Hip Flexion 4-/5    Right/Left Knee Right;Left    Right Knee Flexion 4/5    Right Knee Extension 4+/5    Left Knee Flexion 4+/5    Left Knee Extension 4+/5    Right/Left Ankle Right;Left    Right Ankle Dorsiflexion 4/5    Left Ankle Dorsiflexion 4/5      Transfers   Transfers Sit to Stand;Stand to Sit    Sit to Stand 7: Independent    Five time sit to stand comments  10 seconds    Stand to Sit 7: Independent      Ambulation/Gait   Ambulation/Gait Yes    Ambulation/Gait Assistance 7: Independent    Assistive device None    Gait Pattern Step-through pattern;Decreased hip/knee flexion - right                        Objective measurements completed on examination: See above findings.       [facility] Adult PT Treatment/Exercise - 02/01/22 0001       Exercises   Exercises Knee/Hip;Shoulder      Knee/Hip Exercises: Standing   Hip Flexion Both;20 reps;Knee straight    Hip Flexion Limitations Red t-band around ankles    Hip Abduction Both;20 reps;Knee  straight    Abduction Limitations Red t-band around ankles    Hip Extension Both;20 reps;Knee straight    Extension Limitations Red t-band around ankles       Shoulder Exercises: Standing   Horizontal ABduction Both;12 reps;Theraband    Theraband Level (Shoulder Horizontal ABduction) Level 2 (Red)    Flexion Both;10 reps    Theraband Level (Shoulder Flexion) Level 2 (Red)                          PT Long Term Goals - 02/01/22 1002       PT LONG TERM GOAL #1   Title Patient will be independent with her HEP    Time 4    Period Weeks    Status New    Target Date 03/01/22      PT LONG TERM GOAL #2   Title Patient will be able to demonstrate at least 4/5 bilateral hip flexion for improved functional strength.    Time 4    Period Weeks    Status New    Target Date 03/01/22      PT LONG TERM GOAL #3   Title Patient will be able to demonstrate at least 4+/5 right hamstring strength for improved functional strength.    Time 4    Period Weeks    Status New    Target Date 03/01/22                    Plan - 02/01/22 0948     Clinical Impression Statement Patient is a 71 year old female presenting to physical therapy with global weakness following multiple CVA's. She exhibited mild weakness in both upper and lower extremities, but no AROM limitations. Recommend that she continue with skilled physical therapy to address these impairments to improve her functional strength.    Personal Factors and Comorbidities Comorbidity 3+    Comorbidities HTN, multiple CVA's, CHF, osteopenia    Examination-Activity Limitations Locomotion Level    Examination-Participation Restrictions Other    Stability/Clinical Decision Making Stable/Uncomplicated    Clinical Decision Making Low    Rehab Potential Excellent    PT Frequency 1x / week    PT Duration 4 weeks    PT Treatment/Interventions Neuromuscular re-education;Therapeutic exercise;Therapeutic activities;Patient/family education    PT Next Visit Plan recumbent bike, global strengthening    Consulted and Agree with Plan of Care Patient             Patient will benefit  from skilled therapeutic intervention in order to improve the following deficits and impairments:  Difficulty walking, Decreased strength, Impaired sensation  Visit Diagnosis: Muscle weakness (generalized)     Problem List Patient Active Problem List   Diagnosis Date Noted   History of stroke 01/24/2022   Coronary artery disease involving native coronary artery of native heart without angina pectoris 01/24/2022   TIA (transient ischemic attack) 01/18/2022   Uncontrolled type 2 diabetes mellitus with hyperglycemia, without long-term current use of insulin ([place]) 01/17/2022   Sensory disturbance 01/17/2022   Syncope and collapse 01/16/2022   Ear pain, right 06/13/2021   Restless legs 02/27/2021   Insomnia 02/27/2021   Polyneuropathy due to type 2 diabetes mellitus ([place]) 02/27/2021   Palpitations 01/20/2021   Aortic atherosclerosis ([place]) 01/20/2021   Orthostatic hypotension 01/20/2021   Carpal tunnel syndrome 08/23/2020   Diabetic lipidosis ([place]) 09/09/2019   Hypokalemia 12/11/2018   Benzodiazepine dependence, continuous ([place]) 10/22/2018   Chronic  migraine without aura without status migrainosus, not intractable 02/19/2018   Chronic diastolic CHF (congestive heart failure) ([place]) 03/21/2017   Chronic bilateral low back pain with bilateral sciatica 02/27/2017   Spondylosis of lumbar region without myelopathy or radiculopathy 08/15/2016   Pain of both hip joints 06/18/2016   Pain syndrome, chronic 06/18/2016   Chronic cough 05/17/2016   HLD (hyperlipidemia) 11/04/2015   Macular hole of right eye 09/14/2015   Obesity 07/20/2015   Osteopenia 07/20/2015   Precordial pain 07/06/2015   Overactive bladder    Hypertension    GAD (generalized anxiety disorder)    Acid reflux disease 08/17/2014    Darlin Coco, PT 02/01/2022, 10:13 AM  [facility] [place], [place], [address] Phone: [phone]   Fax:  [phone]  Name:  JUSTINA BERTINI MRN: [id number] Date of Birth: [birth year]

## 2022-02-05 ENCOUNTER — Ambulatory Visit: Payer: Medicare HMO

## 2022-02-05 ENCOUNTER — Other Ambulatory Visit: Payer: Self-pay

## 2022-02-05 DIAGNOSIS — M6281 Muscle weakness (generalized): Secondary | ICD-10-CM

## 2022-02-05 DIAGNOSIS — R531 Weakness: Secondary | ICD-10-CM | POA: Diagnosis not present

## 2022-02-05 DIAGNOSIS — I69998 Other sequelae following unspecified cerebrovascular disease: Secondary | ICD-10-CM | POA: Diagnosis not present

## 2022-02-05 NOTE — Therapy (Signed)
[facility] ?[place] ?[place], [place], [address] ?Phone: [phone]   Fax:  [phone] ? ?Physical Therapy Treatment ? ?Patient Details  ?Name: Yvonne [name] ?MRN: [id number] ?Date of Birth: 1951-08-17 ?Referring Provider (PT): [name], MD ? ? ?Encounter Date: 02/05/2022 ? ? PT End of Session - 02/05/22 1035   ? ? Visit Number 2   ? Number of Visits 4   ? Date for PT Re-Evaluation 03/23/22   ? PT Start Time 1030   ? PT Stop Time 0630   ? PT Time Calculation (min) 42 min   ? Activity Tolerance Patient tolerated treatment well   ? Behavior During Therapy Flat affect   ? ?  ?  ? ?  ? ? ?Past Medical History:  ?Diagnosis Date  ? Anxiety   ? Arthritis   ? left hand  ? Asthma   ? daily and prn inhalers  ? Chronic cough   ? Chronic otitis media 12/2017  ? Full dentures   ? GERD (gastroesophageal reflux disease)   ? History of stroke 09/2017  ? weakness right hand, numbness right side face  ? Hyperlipidemia   ? Hypertension   ? states under control with meds., has been on med. x a long time, per pt.  ? Non-insulin dependent type 2 diabetes mellitus ([place])   ? Overactive bladder   ? Recurrent acute suppurative otitis media without spontaneous rupture of left tympanic membrane 02/19/2018  ? Stroke [facility])   ? Transient cerebral ischemia   ? ? ?Past Surgical History:  ?Procedure Laterality Date  ? ABDOMINAL HYSTERECTOMY    ? complete  ? CATARACT EXTRACTION W/ INTRAOCULAR LENS IMPLANT Right   ? CHOLECYSTECTOMY    ? COLONOSCOPY N/A 08/17/2015  ? Procedure: COLONOSCOPY;  Surgeon: Rogene Houston, MD;  [facility];  Service: Endoscopy;  Laterality: N/A;  200 - moved to 7:30 - Ann notified pt  ? GAS INSERTION Right   ? x 2 - eye  ? LACRIMAL DUCT EXPLORATION Bilateral   ? removal of tear ducts  ? MYRINGOTOMY WITH TUBE PLACEMENT Bilateral 01/28/2018  ? Procedure: BILATERAL MYRINGOTOMY WITH TUBE PLACEMENT;  Surgeon: Leta Baptist, MD;  Location: [facility];  Service: ENT;   Laterality: Bilateral;  ? VITRECTOMY Left 09/14/2015  ? ? ?There were no vitals filed for this visit. ? ? Subjective Assessment - 02/05/22 1034   ? ? Subjective Patient reports that she feels alright today. She was able to do her HEP over the weekend.   ? Pertinent History history of multiple strokes   ? Patient Stated Goals increased strength on the right side   ? Currently in Pain? No/denies   ? ?  ?  ? ?  ? ? ? ? ? ? ? ? ? ? ? ? ? ? ? ? ? ? ? ? [facility] Adult PT Treatment/Exercise - 02/05/22 0001   ? ?  ? Knee/Hip Exercises: Aerobic  ? Recumbent Bike L3 x 15 minutes   ?  ? Knee/Hip Exercises: Machines for Strengthening  ? Cybex Knee Flexion 40# x 2 minutes   ?  ? Knee/Hip Exercises: Standing  ? Lateral Step Up Both;Hand Hold: 2;Step Height: 6"   2 minutes  ? Rocker Board 5 minutes   ? Other Standing Knee Exercises Therabar bending   30 reps up and down  ?  ? Knee/Hip Exercises: Seated  ? Long [employer];Both   3 minutes  ?  Long Arc Quad Weight 4 lbs.   ?  ? Shoulder Exercises: Standing  ? External Rotation Both;Theraband   2 minutes  ? Theraband Level (Shoulder External Rotation) Level 3 (Green)   ? Extension Both;Theraband   30 reps each  ? Theraband Level (Shoulder Extension) Level 3 (Green)   ? ?  ?  ? ?  ? ? ? ? ? ? ? ? ? ? ? ? ? ? ? PT Long Term Goals - 02/01/22 1002   ? ?  ? PT LONG TERM GOAL #1  ? Title Patient will be independent with her HEP   ? Time 4   ? Period Weeks   ? Status New   ? Target Date 03/01/22   ?  ? PT LONG TERM GOAL #2  ? Title Patient will be able to demonstrate at least 4/5 bilateral hip flexion for improved functional strength.   ? Time 4   ? Period Weeks   ? Status New   ? Target Date 03/01/22   ?  ? PT LONG TERM GOAL #3  ? Title Patient will be able to demonstrate at least 4+/5 right hamstring strength for improved functional strength.   ? Time 4   ? Period Weeks   ? Status New   ? Target Date 03/01/22   ? ?  ?  ? ?  ? ? ? ? ? ? ? ? Plan - 02/05/22 1035   ? ? Clinical Impression  Statement Patient was introduced to multiple new interventions for improved strengthening. She required moderate cuing throughout treatment for a slow and controlled movement to facilitate increased muscular demand and control. She reported feeling a little tired upon the conclusion of treatment. She continues to require skilled physical therapy to address her remaining impairments to maximize her functional mobility.   ? Personal Factors and Comorbidities Comorbidity 3+   ? Comorbidities HTN, multiple CVA's, CHF, osteopenia   ? Examination-Activity Limitations Locomotion Level   ? Examination-Participation Restrictions Other   ? Stability/Clinical Decision Making Stable/Uncomplicated   ? Rehab Potential Excellent   ? PT Frequency 1x / week   ? PT Duration 4 weeks   ? PT Treatment/Interventions Neuromuscular re-education;Therapeutic exercise;Therapeutic activities;Patient/family education   ? PT Next Visit Plan recumbent bike, global strengthening   ? Consulted and Agree with Plan of Care Patient   ? ?  ?  ? ?  ? ? ?Patient will benefit from skilled therapeutic intervention in order to improve the following deficits and impairments:  Difficulty walking, Decreased strength, Impaired sensation ? ?Visit Diagnosis: ?Muscle weakness (generalized) ? ? ? ? ?Problem List ?Patient Active Problem List  ? Diagnosis Date Noted  ? History of stroke 01/24/2022  ? Coronary artery disease involving native coronary artery of native heart without angina pectoris 01/24/2022  ? TIA (transient ischemic attack) 01/18/2022  ? Uncontrolled type 2 diabetes mellitus with hyperglycemia, without long-term current use of insulin ([place]) 01/17/2022  ? Sensory disturbance 01/17/2022  ? Syncope and collapse 01/16/2022  ? Ear pain, right 06/13/2021  ? Restless legs 02/27/2021  ? Insomnia 02/27/2021  ? Polyneuropathy due to type 2 diabetes mellitus ([place]) 02/27/2021  ? Palpitations 01/20/2021  ? Aortic atherosclerosis ([place]) 01/20/2021  ? Orthostatic  hypotension 01/20/2021  ? Carpal tunnel syndrome 08/23/2020  ? Diabetic lipidosis ([place]) 09/09/2019  ? Hypokalemia 12/11/2018  ? Benzodiazepine dependence, continuous ([place]) 10/22/2018  ? Chronic migraine without aura without status migrainosus, not intractable 02/19/2018  ? Chronic diastolic CHF (congestive  heart failure) ([place]) 03/21/2017  ? Chronic bilateral low back pain with bilateral sciatica 02/27/2017  ? Spondylosis of lumbar region without myelopathy or radiculopathy 08/15/2016  ? Pain of both hip joints 06/18/2016  ? Pain syndrome, chronic 06/18/2016  ? Chronic cough 05/17/2016  ? HLD (hyperlipidemia) 11/04/2015  ? Macular hole of right eye 09/14/2015  ? Obesity 07/20/2015  ? Osteopenia 07/20/2015  ? Precordial pain 07/06/2015  ? Overactive bladder   ? Hypertension   ? GAD (generalized anxiety disorder)   ? Acid reflux disease 08/17/2014  ? ? ?Darlin Coco, PT ?02/05/2022, 11:54 AM ? ?[facility] ?[facility] ?[place] ?[place], [place], [address] ?Phone: [phone]   Fax:  [phone] ? ?Name: Yvonne [name] ?MRN: [id number] ?Date of Birth: 01-25-1951 ? ? ? ?

## 2022-02-12 ENCOUNTER — Encounter: Payer: Self-pay | Admitting: Physical Therapy

## 2022-02-12 ENCOUNTER — Other Ambulatory Visit: Payer: Self-pay

## 2022-02-12 ENCOUNTER — Ambulatory Visit: Payer: Medicare HMO | Admitting: Physical Therapy

## 2022-02-12 DIAGNOSIS — R531 Weakness: Secondary | ICD-10-CM | POA: Diagnosis not present

## 2022-02-12 DIAGNOSIS — M6281 Muscle weakness (generalized): Secondary | ICD-10-CM

## 2022-02-12 DIAGNOSIS — I69998 Other sequelae following unspecified cerebrovascular disease: Secondary | ICD-10-CM | POA: Diagnosis not present

## 2022-02-12 DIAGNOSIS — I639 Cerebral infarction, unspecified: Secondary | ICD-10-CM | POA: Diagnosis not present

## 2022-02-12 NOTE — Therapy (Addendum)
[facility] [place], [place], [address] Phone: [phone]   Fax:  [phone]  Physical Therapy Treatment  Patient Details  Name: Yvonne [name] MRN: [id number] Date of Birth: November 02, 1951 Referring Provider (PT): [name], MD   Encounter Date: 02/12/2022   PT End of Session - 02/12/22 1034     Visit Number 3    Number of Visits 4    Date for PT Re-Evaluation 03/23/22    PT Start Time 1032    PT Stop Time 1115    PT Time Calculation (min) 43 min    Activity Tolerance Patient tolerated treatment well    Behavior During Therapy Flat affect             Past Medical History:  Diagnosis Date   Anxiety    Arthritis    left hand   Asthma    daily and prn inhalers   Chronic cough    Chronic otitis media 12/2017   Full dentures    GERD (gastroesophageal reflux disease)    History of stroke 09/2017   weakness right hand, numbness right side face   Hyperlipidemia    Hypertension    states under control with meds., has been on med. x a long time, per pt.   Non-insulin dependent type 2 diabetes mellitus ([place])    Overactive bladder    Recurrent acute suppurative otitis media without spontaneous rupture of left tympanic membrane 02/19/2018   Stroke ([place])    Transient cerebral ischemia     Past Surgical History:  Procedure Laterality Date   ABDOMINAL HYSTERECTOMY     complete   CATARACT EXTRACTION W/ INTRAOCULAR LENS IMPLANT Right    CHOLECYSTECTOMY     COLONOSCOPY N/A 08/17/2015   Procedure: COLONOSCOPY;  Surgeon: Rogene Houston, MD;  [facility];  Service: Endoscopy;  Laterality: N/A;  200 - moved to 7:30 - Ann notified pt   GAS INSERTION Right    x 2 - eye   LACRIMAL DUCT EXPLORATION Bilateral    removal of tear ducts   MYRINGOTOMY WITH TUBE PLACEMENT Bilateral 01/28/2018   Procedure: BILATERAL MYRINGOTOMY WITH TUBE PLACEMENT;  Surgeon: Leta Baptist, MD;  Location: [place];  Service: ENT;   Laterality: Bilateral;   VITRECTOMY Left 09/14/2015    There were no vitals filed for this visit.   Subjective Assessment - 02/12/22 1033     Subjective Patient reports that she feels alright today.    Pertinent History history of multiple strokes    Patient Stated Goals increased strength on the right side    Currently in Pain? No/denies                [facility] PT Assessment - 02/12/22 0001       Assessment   Medical Diagnosis Weakness as late effect of CVA    Referring Provider (PT) [name], MD    Next MD Visit 05/03/22    Prior Therapy Yes      Precautions   Precautions None      Restrictions   Weight Bearing Restrictions No                           [facility] Adult PT Treatment/Exercise - 02/12/22 0001       Knee/Hip Exercises: Aerobic   Recumbent Bike L3 x 11 minutes      Knee/Hip Exercises: Machines for Strengthening   Cybex Knee Extension  10# x2 min    Cybex Knee Flexion 30# x2 min      Knee/Hip Exercises: Standing   Hip Flexion Stengthening;Both;3 sets;10 reps;Knee bent;Limitations    Hip Flexion Limitations 4#    Hip Abduction Stengthening;Both;5 sets;5 reps;Knee straight;Limitations    Abduction Limitations 4#    Lateral Step Up Right;3 sets;10 reps;Hand Hold: 2;Step Height: 6"    Forward Step Up Right;3 sets;10 reps;Hand Hold: 2;Step Height: 6"      Knee/Hip Exercises: Seated   Sit to Sand 20 reps;without UE support      Shoulder Exercises: Standing   Horizontal ABduction Strengthening;Both;20 reps;Theraband    Theraband Level (Shoulder Horizontal ABduction) Level 3 (Green)    External Rotation Strengthening;Both;20 reps;Theraband    Theraband Level (Shoulder External Rotation) Level 3 (Green)    Flexion Strengthening;Both;15 reps;Theraband    Theraband Level (Shoulder Flexion) Level 3 (Green)    Flexion Limitations with horizontal abduction    Extension Strengthening;Both;20 reps;Limitations    Extension Limitations Blue XTS     Row Strengthening;Both;20 reps;Limitations    Row Limitations Blue XTS    Diagonals Strengthening;Right;20 reps;Theraband    Theraband Level (Shoulder Diagonals) Level 3 (Green)      Shoulder Exercises: ROM/Strengthening   UBE (Upper Arm Bike) 60 RPM x8 min (forward/backward)                          PT Long Term Goals - 02/12/22 1132       PT LONG TERM GOAL #1   Title Patient will be independent with her HEP    Time 4    Period Weeks    Status On-going    Target Date 03/01/22      PT LONG TERM GOAL #2   Title Patient will be able to demonstrate at least 4/5 bilateral hip flexion for improved functional strength.    Time 4    Period Weeks    Status On-going    Target Date 03/01/22      PT LONG TERM GOAL #3   Title Patient will be able to demonstrate at least 4+/5 right hamstring strength for improved functional strength.    Time 4    Period Weeks    Status On-going    Target Date 03/01/22                   Plan - 02/12/22 1118     Clinical Impression Statement Patient presented in clinic with reports of no functional limitations other than residual R hand numbness. Patient denies any functional limitations at home. Patient progressed with resistance of LEs and UEs without complaint of pain or limitation.    Personal Factors and Comorbidities Comorbidity 3+    Comorbidities HTN, multiple CVA's, CHF, osteopenia    Examination-Activity Limitations Locomotion Level    Examination-Participation Restrictions Other    Stability/Clinical Decision Making Stable/Uncomplicated    Rehab Potential Excellent    PT Frequency 1x / week    PT Duration 4 weeks    PT Treatment/Interventions Neuromuscular re-education;Therapeutic exercise;Therapeutic activities;Patient/family education    PT Next Visit Plan recumbent bike, global strengthening    Consulted and Agree with Plan of Care Patient             Patient will benefit from skilled therapeutic  intervention in order to improve the following deficits and impairments:  Difficulty walking, Decreased strength, Impaired sensation  Visit Diagnosis: Muscle weakness (generalized)     Problem  List Patient Active Problem List   Diagnosis Date Noted   History of stroke 01/24/2022   Coronary artery disease involving native coronary artery of native heart without angina pectoris 01/24/2022   TIA (transient ischemic attack) 01/18/2022   Uncontrolled type 2 diabetes mellitus with hyperglycemia, without long-term current use of insulin ([place]) 01/17/2022   Sensory disturbance 01/17/2022   Syncope and collapse 01/16/2022   Ear pain, right 06/13/2021   Restless legs 02/27/2021   Insomnia 02/27/2021   Polyneuropathy due to type 2 diabetes mellitus ([place]) 02/27/2021   Palpitations 01/20/2021   Aortic atherosclerosis ([place]) 01/20/2021   Orthostatic hypotension 01/20/2021   Carpal tunnel syndrome 08/23/2020   Diabetic lipidosis ([place]) 09/09/2019   Hypokalemia 12/11/2018   Benzodiazepine dependence, continuous ([place]) 10/22/2018   Chronic migraine without aura without status migrainosus, not intractable 02/19/2018   Chronic diastolic CHF (congestive heart failure) ([place]) 03/21/2017   Chronic bilateral low back pain with bilateral sciatica 02/27/2017   Spondylosis of lumbar region without myelopathy or radiculopathy 08/15/2016   Pain of both hip joints 06/18/2016   Pain syndrome, chronic 06/18/2016   Chronic cough 05/17/2016   HLD (hyperlipidemia) 11/04/2015   Macular hole of right eye 09/14/2015   Obesity 07/20/2015   Osteopenia 07/20/2015   Precordial pain 07/06/2015   Overactive bladder    Hypertension    GAD (generalized anxiety disorder)    Acid reflux disease 08/17/2014    Standley Brooking, PTA 02/12/2022, 11:32 AM  [facility] [address] [place], [place], [address] Phone: [phone]   Fax:  [phone]  Name: DAYANI WINBUSH MRN:  [id number] Date of Birth: 1951/03/20  PHYSICAL THERAPY DISCHARGE SUMMARY  Visits from Start of Care: 3  Current functional level related to goals / functional outcomes: Patient was unable to meet her goals for physical therapy. She is being discharged at this time as she has not returned since her last appointment.    Remaining deficits: Weakness    Education / Equipment: HEP    Patient agrees to discharge. Patient goals were not met. Patient is being discharged due to not returning since the last visit.  Jacqulynn Cadet, PT, DPT

## 2022-02-14 NOTE — Progress Notes (Signed)
Received notification from [employer] regarding approval for [place]. Patient assistance approved from 02/14/22 to 10/25/22. ? ?Phone: [phone] ? ?

## 2022-02-15 ENCOUNTER — Encounter: Payer: Self-pay | Admitting: Nurse Practitioner

## 2022-02-15 ENCOUNTER — Ambulatory Visit (INDEPENDENT_AMBULATORY_CARE_PROVIDER_SITE_OTHER): Payer: Medicare HMO | Admitting: Nurse Practitioner

## 2022-02-15 VITALS — BP 120/74 | HR 75 | Temp 97.6°F | Resp 20 | Ht 62.0 in | Wt 177.0 lb

## 2022-02-15 DIAGNOSIS — J309 Allergic rhinitis, unspecified: Secondary | ICD-10-CM | POA: Diagnosis not present

## 2022-02-15 MED ORDER — LORATADINE 10 MG PO TABS: 10.0000 mg | ORAL_TABLET | Freq: Every day | ORAL | 11 refills | Status: DC

## 2022-02-15 NOTE — Patient Instructions (Signed)
Postnasal Drip ?Postnasal drip is the feeling of mucus going down the back of your throat. Mucus is a slimy substance that moistens and cleans your nose and throat, as well as the air pockets in face bones near your forehead and cheeks (sinuses). Small amounts of mucus pass from your nose and sinuses down the back of your throat all the time. This is normal. When you produce too much mucus or the mucus gets too thick, you can feel it. ?Some common causes of postnasal drip include: ?Having more mucus because of: ?A cold or the flu. ?Allergies. ?Cold air. ?Certain medicines. ?Having more mucus that is thicker because of: ?A sinus or nasal infection. ?Dry air. ?A food allergy. ?Follow these instructions at home: ?Relieving discomfort ? ?Gargle with a salt-water mixture 3-4 times a day or as needed. To make a salt-water mixture, completely dissolve ?-1 tsp of salt in 1 cup of warm water. ?If the air in your home is dry, use a humidifier to add moisture to the air. ?Use a saline spray or container (neti pot) to flush out the nose (nasal irrigation). These methods can help clear away mucus and keep the nasal passages moist. ?General instructions ?Take over-the-counter and prescription medicines only as told by your health care provider. ?Follow instructions from your health care provider about eating or drinking restrictions. You may need to avoid caffeine. ?Avoid things that you know you are allergic to (allergens), like dust, mold, pollen, pets, or certain foods. ?Drink enough fluid to keep your urine pale yellow. ?Keep all follow-up visits as told by your health care provider. This is important. ?Contact a health care provider if: ?You have a fever. ?You have a sore throat. ?You have difficulty swallowing. ?You have headache. ?You have sinus pain. ?You have a cough that does not go away. ?The mucus from your nose becomes thick and is green or yellow in color. ?You have cold or flu symptoms that last more than 10  days. ?Summary ?Postnasal drip is the feeling of mucus going down the back of your throat. ?If your health care provider approves, use nasal irrigation or a nasal spray 2?4 times a day. ?Avoid things that you know you are allergic to (allergens), like dust, mold, pollen, pets, or certain foods. ?This information is not intended to replace advice given to you by your health care provider. Make sure you discuss any questions you have with your health care provider. ?Document Revised: 08/23/2020 Document Reviewed: 08/23/2020 ?Elsevier Patient Education ? 2022 Elsevier Inc. ? ?

## 2022-02-15 NOTE — Progress Notes (Signed)
? ?Subjective:  ? ? Patient ID: Yvonne [name], female    DOB: Jun 14, 1951, 71 y.o.   MRN: [id number] ? ?Chief Complaint: bilateral ear pain, Sinus Problem, and Cough ? ? ?Sinus Problem ?Associated symptoms include congestion and coughing. Pertinent negatives include no diaphoresis, headaches, shortness of breath, sinus pressure or sore throat.  ?Cough ?Associated symptoms include postnasal drip and rhinorrhea. Pertinent negatives include no chest pain, headaches, rash, sore throat or shortness of breath.  ?Patient states she started feeling bad yesterday. Has cough , congestion and slight sore thorat. She has not taken any otc meds. ? ? ? ?Review of Systems  ?Constitutional:  Negative for diaphoresis.  ?HENT:  Positive for congestion, postnasal drip and rhinorrhea. Negative for sinus pressure and sore throat.   ?Eyes:  Negative for pain.  ?Respiratory:  Positive for cough. Negative for shortness of breath.   ?Cardiovascular:  Negative for chest pain, palpitations and leg swelling.  ?Gastrointestinal:  Negative for abdominal pain.  ?Endocrine: Negative for polydipsia.  ?Skin:  Negative for rash.  ?Neurological:  Negative for dizziness, weakness and headaches.  ?Hematological:  Does not bruise/bleed easily.  ?All other systems reviewed and are negative. ? ?   ?Objective:  ? Physical Exam ?Vitals and nursing note reviewed.  ?Constitutional:   ?   General: She is not in acute distress. ?   Appearance: Normal appearance. She is well-developed.  ?HENT:  ?   Head: Normocephalic.  ?   Right Ear: Tympanic membrane normal.  ?   Left Ear: Tympanic membrane normal. A PE tube is present.  ?   Nose: Nose normal.  ?   Right Turbinates: Swollen and pale.  ?   Left Turbinates: Swollen and pale.  ?   Right Sinus: No maxillary sinus tenderness or frontal sinus tenderness.  ?   Left Sinus: No maxillary sinus tenderness or frontal sinus tenderness.  ?   Mouth/Throat:  ?   Mouth: Mucous membranes are moist.  ?Eyes:  ?   Pupils: Pupils are  equal, round, and reactive to light.  ?Neck:  ?   Vascular: No carotid bruit or JVD.  ?Cardiovascular:  ?   Rate and Rhythm: Normal rate and regular rhythm.  ?   Heart sounds: Normal heart sounds.  ?Pulmonary:  ?   Effort: Pulmonary effort is normal. No respiratory distress.  ?   Breath sounds: Normal breath sounds. No wheezing or rales.  ?Chest:  ?   Chest wall: No tenderness.  ?Abdominal:  ?   General: Bowel sounds are normal. There is no distension or abdominal bruit.  ?   Palpations: Abdomen is soft. There is no hepatomegaly, splenomegaly, mass or pulsatile mass.  ?   Tenderness: There is no abdominal tenderness.  ?Musculoskeletal:     ?   General: Normal range of motion.  ?   Cervical back: Normal range of motion and neck supple.  ?Lymphadenopathy:  ?   Cervical: No cervical adenopathy.  ?Skin: ?   General: Skin is warm and dry.  ?Neurological:  ?   Mental Status: She is alert and oriented to person, place, and time.  ?   Deep Tendon Reflexes: Reflexes are normal and symmetric.  ?Psychiatric:     ?   Behavior: Behavior normal.     ?   Thought Content: Thought content normal.     ?   Judgment: Judgment normal.  ? ? ?BP 120/74   Pulse 75   Temp 97.6 ?F (36.4 ?C) (Temporal)  Resp 20   Ht '5\' 2"'$  (1.575 m)   Wt 177 lb (80.3 kg)   SpO2 96%   BMI 32.37 kg/m?  ? ? ? ?   ?Assessment & Plan:  ?TORRY ADAMCZAK in today with chief complaint of bilateral ear pain, Sinus Problem, and Cough ? ? ?1. Allergic rhinitis, unspecified seasonality, unspecified trigger ?1. Take meds as prescribed ?2. Use a cool mist humidifier especially during the winter months and when heat has been humid. ?3. Use saline nose sprays frequently ?4. Saline irrigations of the nose can be very helpful if done frequently. ? * 4X daily for 1 week* ? * Use of a nettie pot can be helpful with this. Follow directions with this* ?5. Drink plenty of fluids ?6. Keep thermostat turn down low ?7.For any cough or congestion- no decongestants ?8. For fever or  aces or pains- take tylenol or ibuprofen appropriate for age and weight. ? * for fevers greater than 101 orally you may alternate ibuprofen and tylenol every  3 hours. ?  ?Meds ordered this encounter  ?Medications  ? loratadine (CLARITIN) 10 MG tablet  ?  Sig: Take 1 tablet (10 mg total) by mouth daily.  ?  Dispense:  30 tablet  ?  Refill:  11  ?  Order Specific Question:   Supervising Provider  ?  Answer:   Caryl Pina A [[id number]]  ? ? ? ? ? ?The above assessment and management plan was discussed with the patient. The patient verbalized understanding of and has agreed to the management plan. Patient is aware to call the clinic if symptoms persist or worsen. Patient is aware when to return to the clinic for a follow-up visit. Patient educated on when it is appropriate to go to the emergency department.  ? ?Mary-Margaret Hassell Done, FNP ? ? ? ?

## 2022-02-16 ENCOUNTER — Encounter: Payer: Medicare HMO | Admitting: *Deleted

## 2022-02-17 ENCOUNTER — Other Ambulatory Visit: Payer: Self-pay | Admitting: Family Medicine

## 2022-02-23 ENCOUNTER — Encounter: Payer: Medicare HMO | Admitting: *Deleted

## 2022-02-25 ENCOUNTER — Other Ambulatory Visit: Payer: Self-pay | Admitting: Family Medicine

## 2022-02-25 DIAGNOSIS — J439 Emphysema, unspecified: Secondary | ICD-10-CM

## 2022-02-27 ENCOUNTER — Other Ambulatory Visit: Payer: Self-pay | Admitting: Family Medicine

## 2022-02-27 ENCOUNTER — Telehealth: Payer: Medicare HMO

## 2022-03-01 ENCOUNTER — Ambulatory Visit (INDEPENDENT_AMBULATORY_CARE_PROVIDER_SITE_OTHER): Payer: Medicare HMO

## 2022-03-01 ENCOUNTER — Ambulatory Visit (INDEPENDENT_AMBULATORY_CARE_PROVIDER_SITE_OTHER): Payer: Medicare HMO | Admitting: Family Medicine

## 2022-03-01 ENCOUNTER — Encounter: Payer: Self-pay | Admitting: Family Medicine

## 2022-03-01 VITALS — BP 98/63 | HR 72 | Temp 97.9°F | Ht 62.0 in | Wt 172.2 lb

## 2022-03-01 VITALS — Wt 172.0 lb

## 2022-03-01 DIAGNOSIS — Z0000 Encounter for general adult medical examination without abnormal findings: Secondary | ICD-10-CM | POA: Diagnosis not present

## 2022-03-01 DIAGNOSIS — M7989 Other specified soft tissue disorders: Secondary | ICD-10-CM

## 2022-03-01 MED ORDER — FUROSEMIDE 20 MG PO TABS: 20.0000 mg | ORAL_TABLET | Freq: Every day | ORAL | 3 refills | Status: DC

## 2022-03-01 NOTE — Progress Notes (Signed)
? ?Subjective:  ? Yvonne [name] is a 71 y.o. female who presents for Medicare Annual (Subsequent) preventive examination. ? ?Virtual Visit via Telephone Note ? ?I connected with  Yvonne [name] on 03/01/22 at 11:15 AM EDT by telephone and verified that I am speaking with the correct person using two identifiers. ? ?Location: ?Patient: Home ?Provider: [facility] ?Persons participating in the virtual visit: patient/Nurse Health Advisor ?  ?I discussed the limitations, risks, security and privacy concerns of performing an evaluation and management service by telephone and the availability of in person appointments. The patient expressed understanding and agreed to proceed. ? ?Interactive audio and video telecommunications were attempted between this nurse and patient, however failed, due to patient having technical difficulties OR patient did not have access to video capability.  We continued and completed visit with audio only. ? ?Some vital signs may be absent or patient reported.  ? ?Harla Mensch Dionne Ano, LPN  ? ?Review of Systems    ? ?Cardiac Risk Factors include: advanced age (>103men, >57 women);diabetes mellitus;dyslipidemia;hypertension;sedentary lifestyle;obesity (BMI >30kg/m2);family history of premature cardiovascular disease;Other (see comment), Risk factor comments: hx of TIA, CAD, CHF ? ?   ?Objective:  ?  ?Today's Vitals  ? 03/01/22 1115  ?Weight: 172 lb (78 kg)  ?PainSc: 8   ? ?Body mass index is 31.46 kg/m?. ? ? ?  03/01/2022  ? 11:30 AM 02/01/2022  ?  9:08 AM 01/23/2022  ?  2:57 PM 01/16/2022  ?  9:00 PM 01/16/2022  ?  4:57 PM 02/28/2021  ? 11:01 AM 01/18/2020  ? 11:07 AM  ?Advanced Directives  ?Does Patient Have a Medical Advance Directive? No No No No No No No  ?Would patient like information on creating a medical advance directive? No - Patient declined  No - Patient declined No - Patient declined No - Patient declined Yes (MAU/Ambulatory/Procedural Areas - Information given) No - Patient declined  ? ? ?Current  Medications (verified) ?Outpatient Encounter Medications as of 03/01/2022  ?Medication Sig  ? ACCU-CHEK GUIDE test strip TEST BLOOD SUGAR 4 TIMES DAILY DX E11.69  ? Accu-Chek Softclix Lancets lancets Test BS up to 4 times daily Dx E11.69  ? albuterol (VENTOLIN HFA) 108 (90 Base) MCG/ACT inhaler TAKE 2 PUFFS BY MOUTH EVERY 6 HOURS AS NEEDED FOR WHEEZE OR SHORTNESS OF BREATH  ? alendronate (FOSAMAX) 70 MG tablet TAKE 1 TABLET EVERY 7 DAYS WITH A FULL GLASS OF WATER ON AN EMPTY STOMACH  ? ALPRAZolam (XANAX) 1 MG tablet ! Each morning, one each evening, and 1/2 each afternoon  ? amLODipine (NORVASC) 10 MG tablet TAKE 1 TABLET BY MOUTH EVERY DAY  ? aspirin 81 MG chewable tablet Chew 1 tablet (81 mg total) by mouth daily. X 90 days  ? atorvastatin (LIPITOR) 80 MG tablet Take 1 tablet (80 mg total) by mouth daily.  ? Bempedoic Acid-Ezetimibe (NEXLIZET) 180-10 MG TABS Take 180 mg by mouth daily.  ? Blood Glucose Monitoring Suppl (ACCU-CHEK GUIDE) w/Device KIT Test Bs QID Dx E11.69  ? clopidogrel (PLAVIX) 75 MG tablet TAKE 1 TABLET BY MOUTH EVERY DAY WITH BREAKFAST  ? dapagliflozin propanediol (FARXIGA) 10 MG TABS tablet Take 1 tablet (10 mg total) by mouth daily.  ? famotidine (PEPCID) 20 MG tablet Take 20 mg by mouth at bedtime.  ? Fexofenadine HCl (ALLEGRA PO) Take 1 tablet by mouth daily.  ? furosemide (LASIX) 20 MG tablet Take 1 tablet (20 mg total) by mouth daily.  ? glipiZIDE (GLUCOTROL) 5 MG tablet  Take 1 tablet (5 mg total) by mouth daily before breakfast.  ? loratadine (CLARITIN) 10 MG tablet Take 1 tablet (10 mg total) by mouth daily.  ? nitroGLYCERIN (NITROSTAT) 0.4 MG SL tablet Place 1 tablet (0.4 mg total) under the tongue every 5 (five) minutes as needed for chest pain.  ? oxybutynin (DITROPAN-XL) 5 MG 24 hr tablet Take 5 mg by mouth at bedtime.  ? Semaglutide, 2 MG/DOSE, (OZEMPIC, 2 MG/DOSE,) 8 MG/3ML SOPN Inject 2 mg into the skin once a week.  ? vitamin B-12 (CYANOCOBALAMIN) 500 MCG tablet Take 1 tablet (500  mcg total) by mouth daily.  ? ?No facility-administered encounter medications on file as of 03/01/2022.  ? ? ?Allergies (verified) ?Penicillins, Gabapentin, Lisinopril, and Soap  ? ?History: ?Past Medical History:  ?Diagnosis Date  ? Anxiety   ? Arthritis   ? left hand  ? Asthma   ? daily and prn inhalers  ? Chronic cough   ? Chronic otitis media 12/2017  ? Full dentures   ? GERD (gastroesophageal reflux disease)   ? History of stroke 09/2017  ? weakness right hand, numbness right side face  ? Hyperlipidemia   ? Hypertension   ? states under control with meds., has been on med. x a long time, per pt.  ? Non-insulin dependent type 2 diabetes mellitus ([place])   ? Overactive bladder   ? Recurrent acute suppurative otitis media without spontaneous rupture of left tympanic membrane 02/19/2018  ? Stroke [facility])   ? Transient cerebral ischemia   ? ?Past Surgical History:  ?Procedure Laterality Date  ? ABDOMINAL HYSTERECTOMY    ? complete  ? CATARACT EXTRACTION W/ INTRAOCULAR LENS IMPLANT Right   ? CHOLECYSTECTOMY    ? COLONOSCOPY N/A 08/17/2015  ? Procedure: COLONOSCOPY;  Surgeon: Rogene Houston, MD;  [facility];  Service: Endoscopy;  Laterality: N/A;  200 - moved to 7:30 - Ann notified pt  ? GAS INSERTION Right   ? x 2 - eye  ? LACRIMAL DUCT EXPLORATION Bilateral   ? removal of tear ducts  ? MYRINGOTOMY WITH TUBE PLACEMENT Bilateral 01/28/2018  ? Procedure: BILATERAL MYRINGOTOMY WITH TUBE PLACEMENT;  Surgeon: Leta Baptist, MD;  Location: [place];  Service: ENT;  Laterality: Bilateral;  ? VITRECTOMY Left 09/14/2015  ? ?Family History  ?Problem Relation Age of Onset  ? Arthritis Mother   ? Diabetes Mother   ? CVA Mother   ? Stroke Mother   ? Diabetes Father   ? Heart disease Father   ?     CABG.  Does not know age of onset  ? Asthma Father   ? Hepatitis C Sister   ? Arthritis Brother   ? Cancer Brother   ?     metastic cancer  ? Peripheral Artery Disease Daughter   ? Arthritis-Osteo Son   ? ?Social  History  ? ?Socioeconomic History  ? Marital status: Married  ?  Spouse name: Not on file  ? Number of children: 3  ? Years of education: Not on file  ? Highest education level: GED or equivalent  ?Occupational History  ? Occupation: retired  ?  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 Smoking status: Never  ? Smokeless tobacco: Never  ?Vaping Use  ? Vaping Use: Never used  ?Substance and Sexual Activity  ? Alcohol use: Not Currently  ? Drug use: No  ? Sexual activity: Yes  ?Other Topics Concern  ? Not  on file  ?Social History Narrative  ? Lives at home home with husband with   ? Son, daughter in law and 3 children.  [name], his girlfriend and her child also moved in 10/2018  ?   ? Disabled.  ? ?Social Determinants of Health  ? ?Financial Resource Strain: Medium Risk  ? Difficulty of Paying Living Expenses: Somewhat hard  ?Food Insecurity: No Food Insecurity  ? Worried About Charity fundraiser in the Last Year: Never true  ? Ran Out of Food in the Last Year: Never true  ?Transportation Needs: No Transportation Needs  ? Lack of Transportation (Medical): No  ? Lack of Transportation (Non-Medical): No  ?Physical Activity: Sufficiently Active  ? Days of Exercise per Week: 5 days  ? Minutes of Exercise per Session: 30 min  ?Stress: Stress Concern Present  ? Feeling of Stress : Rather much  ?Social Connections: Socially Integrated  ? Frequency of Communication with Friends and Family: More than three times a week  ? Frequency of Social Gatherings with Friends and Family: More than three times a week  ? Attends Religious Services: 1 to 4 times per year  ? Active Member of Clubs or Organizations: Yes  ? Attends Archivist Meetings: More than 4 times per year  ? Marital Status: Married  ? ? ?Tobacco Counseling ?Counseling given: Not Answered ? ? ?Clinical Intake: ? ?Pre-visit preparation completed: Yes ? ?Pain : 0-10 ?Pain Score: 8  ?Pain Type: Chronic pain ?Pain Location: Leg ?Pain  Orientation: Right, Left ?Pain Descriptors / Indicators: Aching, Discomfort, Tender ?Pain Onset: More than a month ago ?Pain Frequency: Intermittent ? ?  ? ?BMI - recorded: 31.46 ?Nutritional Status: BMI > 30  Obese ?Nutriti

## 2022-03-01 NOTE — Progress Notes (Signed)
?  ? ?Subjective:  ?Patient ID: Yvonne [name], female    DOB: 12/26/1950, 71 y.o.   MRN: [id number] ? ?Patient Care Team: ?[name], Warren, MD as PCP - General (Family Medicine) ?Setzer, Terri L, NP (Inactive) as Nurse Practitioner (Internal Medicine) ?Jaffe, Adam R, DO as Consulting Physician (Neurology) ?Kapural, Leonardo, MD (Anesthesiology) ?Forrest, Michael S, [name] as Social Worker (Licensed Clinical Social Worker) ?Hudy, Kristen N, RN as Case Manager ?Pruitt, Julie D, RPH (Pharmacist) ?Chandrasekhar, Mahesh A, MD as Consulting Physician (Cardiology)  ? ?Chief Complaint:  Edema (Bilateral ankles) ? ? ?HPI: ?Yvonne [name] is a 71 y.o. female presenting on 03/01/2022 for Edema (Bilateral ankles) ? ? ?Pt presents today with complaints of bilateral feet and ankle swelling. States she was on Lasix in the past and this worked great for her edema but she has been out of this for a while. She states she is up and going all day and does not have time to elevate her legs during the day. She does not wear compression hose. She states she does not add salt to anything but does not read food labels. She denies chest pain, palpitations, cough, shortness of breath, orthopnea, or PND.  ? ? ? ?Relevant past medical, surgical, family, and social history reviewed and updated as indicated.  ?Allergies and medications reviewed and updated. Data reviewed: Chart in Epic. ? ? ?Past Medical History:  ?Diagnosis Date  ? Anxiety   ? Arthritis   ? left hand  ? Asthma   ? daily and prn inhalers  ? Chronic cough   ? Chronic otitis media 12/2017  ? Full dentures   ? GERD (gastroesophageal reflux disease)   ? History of stroke 09/2017  ? weakness right hand, numbness right side face  ? Hyperlipidemia   ? Hypertension   ? states under control with meds., has been on med. x a long time, per pt.  ? Non-insulin dependent type 2 diabetes mellitus (HCC)   ? Overactive bladder   ? Recurrent acute suppurative otitis media without spontaneous rupture of  left tympanic membrane 02/19/2018  ? Stroke (HCC)   ? Transient cerebral ischemia   ? ? ?Past Surgical History:  ?Procedure Laterality Date  ? ABDOMINAL HYSTERECTOMY    ? complete  ? CATARACT EXTRACTION W/ INTRAOCULAR LENS IMPLANT Right   ? CHOLECYSTECTOMY    ? COLONOSCOPY N/A 08/17/2015  ? Procedure: COLONOSCOPY;  Surgeon: Najeeb U Rehman, MD;  Location: [facility];  Service: Endoscopy;  Laterality: N/A;  200 - moved to 7:30 - Ann notified pt  ? GAS INSERTION Right   ? x 2 - eye  ? LACRIMAL DUCT EXPLORATION Bilateral   ? removal of tear ducts  ? MYRINGOTOMY WITH TUBE PLACEMENT Bilateral 01/28/2018  ? Procedure: BILATERAL MYRINGOTOMY WITH TUBE PLACEMENT;  Surgeon: Teoh, Su, MD;  Location: [facility];  Service: ENT;  Laterality: Bilateral;  ? VITRECTOMY Left 09/14/2015  ? ? ?Social History  ? ?Socioeconomic History  ? Marital status: Married  ?  Spouse name: Not on file  ? Number of children: 3  ? Years of education: Not on file  ? Highest education level: GED or equivalent  ?Occupational History  ? Occupation: retired  ?  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 Smoking status: Never  ? Smokeless tobacco: Never  ?Vaping Use  ? Vaping Use: Never used  ?Substance and Sexual Activity  ? Alcohol use: Not Currently  ? Drug use: No  ?   Sexual activity: Yes  ?Other Topics Concern  ? Not on file  ?Social History Narrative  ? Lives at home home with husband with son, daughter in law and 3 children.  [name], his girlfriend and her child also moved in 10/2018  ?   ? Disabled.  ? ?Social Determinants of Health  ? ?Financial Resource Strain: Not on file  ?Food Insecurity: Not on file  ?Transportation Needs: Not on file  ?Physical Activity: Not on file  ?Stress: Stress Concern Present  ? Feeling of Stress : Rather much  ?Social Connections: Not on file  ?Intimate Partner Violence: Not on file  ? ? ?Outpatient Encounter Medications as of 03/01/2022  ?Medication Sig  ? ACCU-CHEK GUIDE test strip  TEST BLOOD SUGAR 4 TIMES DAILY DX E11.69  ? Accu-Chek Softclix Lancets lancets Test BS up to 4 times daily Dx E11.69  ? albuterol (VENTOLIN HFA) 108 (90 Base) MCG/ACT inhaler TAKE 2 PUFFS BY MOUTH EVERY 6 HOURS AS NEEDED FOR WHEEZE OR SHORTNESS OF BREATH  ? alendronate (FOSAMAX) 70 MG tablet TAKE 1 TABLET EVERY 7 DAYS WITH A FULL GLASS OF WATER ON AN EMPTY STOMACH  ? ALPRAZolam (XANAX) 1 MG tablet ! Each morning, one each evening, and 1/2 each afternoon  ? amLODipine (NORVASC) 10 MG tablet TAKE 1 TABLET BY MOUTH EVERY DAY  ? aspirin 81 MG chewable tablet Chew 1 tablet (81 mg total) by mouth daily. X 90 days  ? atorvastatin (LIPITOR) 80 MG tablet Take 1 tablet (80 mg total) by mouth daily.  ? Bempedoic Acid-Ezetimibe (NEXLIZET) 180-10 MG TABS Take 180 mg by mouth daily.  ? Blood Glucose Monitoring Suppl (ACCU-CHEK GUIDE) w/Device KIT Test Bs QID Dx E11.69  ? clopidogrel (PLAVIX) 75 MG tablet TAKE 1 TABLET BY MOUTH EVERY DAY WITH BREAKFAST  ? dapagliflozin propanediol (FARXIGA) 10 MG TABS tablet Take 1 tablet (10 mg total) by mouth daily.  ? famotidine (PEPCID) 20 MG tablet Take 20 mg by mouth at bedtime.  ? Fexofenadine HCl (ALLEGRA PO) Take 1 tablet by mouth daily.  ? furosemide (LASIX) 20 MG tablet Take 1 tablet (20 mg total) by mouth daily.  ? glipiZIDE (GLUCOTROL) 5 MG tablet Take 1 tablet (5 mg total) by mouth daily before breakfast.  ? loratadine (CLARITIN) 10 MG tablet Take 1 tablet (10 mg total) by mouth daily.  ? nitroGLYCERIN (NITROSTAT) 0.4 MG SL tablet Place 1 tablet (0.4 mg total) under the tongue every 5 (five) minutes as needed for chest pain.  ? oxybutynin (DITROPAN-XL) 5 MG 24 hr tablet Take 5 mg by mouth at bedtime.  ? Semaglutide, 2 MG/DOSE, (OZEMPIC, 2 MG/DOSE,) 8 MG/3ML SOPN Inject 2 mg into the skin once a week.  ? vitamin B-12 (CYANOCOBALAMIN) 500 MCG tablet Take 1 tablet (500 mcg total) by mouth daily.  ? [DISCONTINUED] furosemide (LASIX) 20 MG tablet Take 1 tablet (20 mg total) by mouth  daily.  ? ?No facility-administered encounter medications on file as of 03/01/2022.  ? ? ?Allergies  ?Allergen Reactions  ? Penicillins Hives and Itching  ?  Has patient had a PCN reaction causing immediate rash, facial/tongue/throat swelling, SOB or lightheadedness with hypotension: Yes ?Has patient had a PCN reaction causing severe rash involving mucus membranes or skin necrosis: Unk ?Has patient had a PCN reaction that required hospitalization: No ?Has patient had a PCN reaction occurring within the last 10 years: No ?If all of the above answers are "NO", then may proceed with Cephalosporin use. ? ?Has patient  had a PCN reaction causing immediate rash, facial/tongue/throat swelling, SOB or lightheadedness with hypotension: Yes ?Has patient had a PCN reaction causing severe rash involving mucus membranes or skin necrosis: No ?Has patient had a PCN reaction that required hospitalization No ?Has patient had a PCN reaction occurring within the last 10 years: No ?If all of the above answers are "NO", then may proceed with Cephalosporin use. ?Has patient had a PCN reaction causing immediate rash, facial/tongue/throat swelling, SOB or lightheadedness with hypotension: Yes ?Has patient had a PCN reaction causing severe rash involving mucus membranes or skin necrosis: Unk ?Has patient had a PCN reaction that required hospitalization: No ?Has patient had a PCN reaction occurring within the last 10 years: No ?If all of the above answers are "NO", then may proceed with Cephalosporin use. ?Has patient had a PCN reaction causing immediate rash, facial/tongue/throat swelling, SOB or lightheadedness with hypotension: Yes ?Has patient had a PCN reaction causing severe rash involving mucus membranes or skin necrosis: No ?Has patient had a PCN reaction that required hospitalization No ?Has patient had a PCN reaction occurring within the last 10 years: No ?If all of the above answers are "NO", then may proceed with Cephalosporin  use. ?Has patient had a PCN reaction causing immediate rash, facial/tongue/throat swelling, SOB or lightheadedness with hypotension: Yes ?Has patient had a PCN reaction causing sev... (TRUNCATED)  ? Gabapentin Othe

## 2022-03-01 NOTE — Patient Instructions (Signed)
Ms. Polidore , ?Thank you for taking time to come for your Medicare Wellness Visit. I appreciate your ongoing commitment to your health goals. Please review the following plan we discussed and let me know if I can assist you in the future.  ? ?Screening recommendations/referrals: ?Colonoscopy: Done 08/17/2015 - Repeat in 7 years ?Mammogram: Done 05/31/2021 - Repeat annually  ?Bone Density: Done 03/08/2021 - Repeat every 2 years ?Recommended yearly ophthalmology/optometry visit for glaucoma screening and checkup ?Recommended yearly dental visit for hygiene and checkup ? ?Vaccinations: ?Influenza vaccine: Done 08/30/2021 - Repeat annually  ?Pneumococcal vaccine: Done 01/06/2015 & 10/22/2018  ?Tdap vaccine: Done 05/22/2021 -Repeat in 10 years ?Shingles vaccine: Done 06/06/2017 & 02/26/2018  ?Covid-19:Done 06/14/2020 & 07/12/2020 ? ?Advanced directives: Advance directive discussed with you today. Even though you declined this today, please call our office should you change your mind, and we can give you the proper paperwork for you to fill out.  ? ?Conditions/risks identified: Aim for 30 minutes of exercise or brisk walking, 6-8 glasses of water, and 5 servings of fruits and vegetables each day.  ? ?Next appointment: Follow up in one year for your annual wellness visit  ? ? ?Preventive Care 11 Years and Older, Female ?Preventive care refers to lifestyle choices and visits with your health care provider that can promote health and wellness. ?What does preventive care include? ?A yearly physical exam. This is also called an annual well check. ?Dental exams once or twice a year. ?Routine eye exams. Ask your health care provider how often you should have your eyes checked. ?Personal lifestyle choices, including: ?Daily care of your teeth and gums. ?Regular physical activity. ?Eating a healthy diet. ?Avoiding tobacco and drug use. ?Limiting alcohol use. ?Practicing safe sex. ?Taking low-dose aspirin every day. ?Taking vitamin and mineral  supplements as recommended by your health care provider. ?What happens during an annual well check? ?The services and screenings done by your health care provider during your annual well check will depend on your age, overall health, lifestyle risk factors, and family history of disease. ?Counseling  ?Your health care provider may ask you questions about your: ?Alcohol use. ?Tobacco use. ?Drug use. ?Emotional well-being. ?Home and relationship well-being. ?Sexual activity. ?Eating habits. ?History of falls. ?Memory and ability to understand (cognition). ?Work and work Statistician. ?Reproductive health. ?Screening  ?You may have the following tests or measurements: ?Height, weight, and BMI. ?Blood pressure. ?Lipid and cholesterol levels. These may be checked every 5 years, or more frequently if you are over 62 years old. ?Skin check. ?Lung cancer screening. You may have this screening every year starting at age 29 if you have a 30-pack-year history of smoking and currently smoke or have quit within the past 15 years. ?Fecal occult blood test (FOBT) of the stool. You may have this test every year starting at age 17. ?Flexible sigmoidoscopy or colonoscopy. You may have a sigmoidoscopy every 5 years or a colonoscopy every 10 years starting at age 67. ?Hepatitis C blood test. ?Hepatitis B blood test. ?Sexually transmitted disease (STD) testing. ?Diabetes screening. This is done by checking your blood sugar (glucose) after you have not eaten for a while (fasting). You may have this done every 1-3 years. ?Bone density scan. This is done to screen for osteoporosis. You may have this done starting at age 97. ?Mammogram. This may be done every 1-2 years. Talk to your health care provider about how often you should have regular mammograms. ?Talk with your health care provider about your  test results, treatment options, and if necessary, the need for more tests. ?Vaccines  ?Your health care provider may recommend certain  vaccines, such as: ?Influenza vaccine. This is recommended every year. ?Tetanus, diphtheria, and acellular pertussis (Tdap, Td) vaccine. You may need a Td booster every 10 years. ?Zoster vaccine. You may need this after age 79. ?Pneumococcal 13-valent conjugate (PCV13) vaccine. One dose is recommended after age 74. ?Pneumococcal polysaccharide (PPSV23) vaccine. One dose is recommended after age 83. ?Talk to your health care provider about which screenings and vaccines you need and how often you need them. ?This information is not intended to replace advice given to you by your health care provider. Make sure you discuss any questions you have with your health care provider. ?Document Released: 12/09/2015 Document Revised: 08/01/2016 Document Reviewed: 09/13/2015 ?Elsevier Interactive Patient Education ? 2017 [place]. ? ?Fall Prevention in the Home ?Falls can cause injuries. They can happen to people of all ages. There are many things you can do to make your home safe and to help prevent falls. ?What can I do on the outside of my home? ?Regularly fix the edges of walkways and driveways and fix any cracks. ?Remove anything that might make you trip as you walk through a door, such as a raised step or threshold. ?Trim any bushes or trees on the path to your home. ?Use bright outdoor lighting. ?Clear any walking paths of anything that might make someone trip, such as rocks or tools. ?Regularly check to see if handrails are loose or broken. Make sure that both sides of any steps have handrails. ?Any raised decks and porches should have guardrails on the edges. ?Have any leaves, snow, or ice cleared regularly. ?Use sand or salt on walking paths during winter. ?Clean up any spills in your garage right away. This includes oil or grease spills. ?What can I do in the bathroom? ?Use night lights. ?Install grab bars by the toilet and in the tub and shower. Do not use towel bars as grab bars. ?Use non-skid mats or decals in  the tub or shower. ?If you need to sit down in the shower, use a plastic, non-slip stool. ?Keep the floor dry. Clean up any water that spills on the floor as soon as it happens. ?Remove soap buildup in the tub or shower regularly. ?Attach bath mats securely with double-sided non-slip rug tape. ?Do not have throw rugs and other things on the floor that can make you trip. ?What can I do in the bedroom? ?Use night lights. ?Make sure that you have a light by your bed that is easy to reach. ?Do not use any sheets or blankets that are too big for your bed. They should not hang down onto the floor. ?Have a firm chair that has side arms. You can use this for support while you get dressed. ?Do not have throw rugs and other things on the floor that can make you trip. ?What can I do in the kitchen? ?Clean up any spills right away. ?Avoid walking on wet floors. ?Keep items that you use a lot in easy-to-reach places. ?If you need to reach something above you, use a strong step stool that has a grab bar. ?Keep electrical cords out of the way. ?Do not use floor polish or wax that makes floors slippery. If you must use wax, use non-skid floor wax. ?Do not have throw rugs and other things on the floor that can make you trip. ?What can I do with my  stairs? ?Do not leave any items on the stairs. ?Make sure that there are handrails on both sides of the stairs and use them. Fix handrails that are broken or loose. Make sure that handrails are as long as the stairways. ?Check any carpeting to make sure that it is firmly attached to the stairs. Fix any carpet that is loose or worn. ?Avoid having throw rugs at the top or bottom of the stairs. If you do have throw rugs, attach them to the floor with carpet tape. ?Make sure that you have a light switch at the top of the stairs and the bottom of the stairs. If you do not have them, ask someone to add them for you. ?What else can I do to help prevent falls? ?Wear shoes that: ?Do not have high  heels. ?Have rubber bottoms. ?Are comfortable and fit you well. ?Are closed at the toe. Do not wear sandals. ?If you use a stepladder: ?Make sure that it is fully opened. Do not climb a closed stepladder. ?Dillard Essex

## 2022-03-02 LAB — BMP8+EGFR
BUN/Creatinine Ratio: 22 (ref 12–28)
BUN: 23 mg/dL (ref 8–27)
CO2: 25 mmol/L (ref 20–29)
Calcium: 8.9 mg/dL (ref 8.7–10.3)
Chloride: 106 mmol/L (ref 96–106)
Creatinine, Ser: 1.04 mg/dL — ABNORMAL HIGH (ref 0.57–1.00)
Glucose: 201 mg/dL — ABNORMAL HIGH (ref 70–99)
Potassium: 4 mmol/L (ref 3.5–5.2)
Sodium: 145 mmol/L — ABNORMAL HIGH (ref 134–144)
eGFR: 57 mL/min/{1.73_m2} — ABNORMAL LOW (ref >59)

## 2022-03-02 LAB — BRAIN NATRIURETIC PEPTIDE: BNP: 58.4 pg/mL (ref 0.0–100.0)

## 2022-03-05 ENCOUNTER — Ambulatory Visit: Payer: Medicare HMO | Admitting: Family Medicine

## 2022-03-15 ENCOUNTER — Ambulatory Visit (INDEPENDENT_AMBULATORY_CARE_PROVIDER_SITE_OTHER): Payer: Medicare HMO | Admitting: Nurse Practitioner

## 2022-03-15 ENCOUNTER — Encounter: Payer: Self-pay | Admitting: Nurse Practitioner

## 2022-03-15 DIAGNOSIS — R609 Edema, unspecified: Secondary | ICD-10-CM | POA: Diagnosis not present

## 2022-03-15 NOTE — Progress Notes (Signed)
? ?  Virtual Visit  Note ?Due to COVID-19 pandemic this visit was conducted virtually. This visit type was conducted due to national recommendations for restrictions regarding the COVID-19 Pandemic (e.g. social distancing, sheltering in place) in an effort to limit this patient's exposure and mitigate transmission in our community. All issues noted in this document were discussed and addressed.  A physical exam was not performed with this format. ? ?I connected with Yvonne [name] on 03/15/22 at 3:58 by telephone and verified that I am speaking with the correct person using two identifiers. Yvonne [name] is currently located at home and no one is currently with her during visit. The provider, Mary-Margaret Hassell Done, FNP is located in their office at time of visit. ? ?I discussed the limitations, risks, security and privacy concerns of performing an evaluation and management service by telephone and the availability of in person appointments. I also discussed with the patient that there may be a patient responsible charge related to this service. The patient expressed understanding and agreed to proceed. ? ? ?History and Present Illness: ? ?Patient says hat she has swelling in both feet. Started a couple of days ago. She is on lasix '20mg'$  daily and she takes them every day. Swelling goes down at night when sleeping. No weight changes or increasing SOB. ? ? ? ? ?Review of Systems  ?Constitutional:  Negative for diaphoresis and weight loss.  ?Eyes:  Negative for blurred vision, double vision and pain.  ?Respiratory:  Negative for shortness of breath.   ?Cardiovascular:  Positive for leg swelling. Negative for chest pain, palpitations and orthopnea.  ?Gastrointestinal:  Negative for abdominal pain.  ?Skin:  Negative for rash.  ?Neurological:  Negative for dizziness, sensory change, loss of consciousness, weakness and headaches.  ?Endo/Heme/Allergies:  Negative for polydipsia. Does not bruise/bleed easily.   ?Psychiatric/Behavioral:  Negative for memory loss. The patient does not have insomnia.   ?All other systems reviewed and are negative. ? ? ?Observations/Objective: ?Alert and oriented- answers all questions appropriately ?No distress ? ? ?Assessment and Plan: ?Yvonne [name] in today with chief complaint of Foot Swelling ? ? ?1. Peripheral edema ?Continue lasix ?Compression socks daily ?Elevate legs when swelling ?Keep follow up with DR. [name]. ? ?Follow Up Instructions: ?prn ? ?  ?I discussed the assessment and treatment plan with the patient. The patient was provided an opportunity to ask questions and all were answered. The patient agreed with the plan and demonstrated an understanding of the instructions. ?  ?The patient was advised to call back or seek an in-person evaluation if the symptoms worsen or if the condition fails to improve as anticipated. ? ?The above assessment and management plan was discussed with the patient. The patient verbalized understanding of and has agreed to the management plan. Patient is aware to call the clinic if symptoms persist or worsen. Patient is aware when to return to the clinic for a follow-up visit. Patient educated on when it is appropriate to go to the emergency department.  ? ?Time call ended:  4:10 ? ?I provided 12 minutes of  non face-to-face time during this encounter. ? ? ? ?Mary-Margaret Hassell Done, FNP ? ? ?

## 2022-03-15 NOTE — Patient Instructions (Signed)
Edema  Edema is an abnormal buildup of fluids in the body tissues and under the skin. Swelling of the legs, feet, and ankles is a common symptom that becomes more likely as you get older. Swelling is also common in looser tissues, such as around the eyes. Pressing on the area may make a temporary dent in your skin (pitting edema). This fluid may also accumulate in your lungs (pulmonary edema). There are many possible causes of edema. Eating too much salt (sodium) and being on your feet or sitting for a long time can cause edema in your legs, feet, and ankles. Common causes of edema include: Certain medical conditions, such as heart failure, liver or kidney disease, and cancer. Weak leg blood vessels. An injury. Pregnancy. Medicines. Being obese. Low protein levels in the blood. Hot weather may make edema worse. Edema is usually painless. Your skin may look swollen or shiny. Follow these instructions at home: Medicines Take over-the-counter and prescription medicines only as told by your health care provider. Your health care provider may prescribe a medicine to help your body get rid of extra water (diuretic). Take this medicine if you are told to take it. Eating and drinking Eat a low-salt (low-sodium) diet to reduce fluid as told by your health care provider. Sometimes, eating less salt may reduce swelling. Depending on the cause of your swelling, you may need to limit how much fluid you drink (fluid restriction). General instructions Raise (elevate) the injured area above the level of your heart while you are sitting or lying down. Do not sit still or stand for long periods of time. Do not wear tight clothing. Do not wear garters on your upper legs. Exercise your legs to get your circulation going. This helps to move the fluid back into your blood vessels, and it may help the swelling go down. Wear compression stockings as told by your health care provider. These stockings help to prevent  blood clots and reduce swelling in your legs. It is important that these are the correct size. These stockings should be prescribed by your health care provider to prevent possible injuries. If elastic bandages or wraps are recommended, use them as told by your health care provider. Contact a health care provider if: Your edema does not get better with treatment. You have heart, liver, or kidney disease and have symptoms of edema. You have sudden and unexplained weight gain. Get help right away if: You develop shortness of breath or chest pain. You cannot breathe when you lie down. You develop pain, redness, or warmth in the swollen areas. You have heart, liver, or kidney disease and suddenly get edema. You have a fever and your symptoms suddenly get worse. These symptoms may be an emergency. Get help right away. Call 911. Do not wait to see if the symptoms will go away. Do not drive yourself to the hospital. Summary Edema is an abnormal buildup of fluids in the body tissues and under the skin. Eating too much salt (sodium)and being on your feet or sitting for a long time can cause edema in your legs, feet, and ankles. Raise (elevate) the injured area above the level of your heart while you are sitting or lying down. Follow your health care provider's instructions about diet and how much fluid you can drink. This information is not intended to replace advice given to you by your health care provider. Make sure you discuss any questions you have with your health care provider. Document Revised: 07/17/2021 Document   Reviewed: 07/17/2021 Elsevier Patient Education  2023 Elsevier Inc.  

## 2022-03-20 IMAGING — DX DG LUMBAR SPINE 2-3V
2 series · 2 of 2 positions shown · non-contrast
Comparison: None.

CLINICAL DATA: Left lower back and hip pain

EXAM:
LUMBAR SPINE - 2-3 VIEW

[l-spine ap]
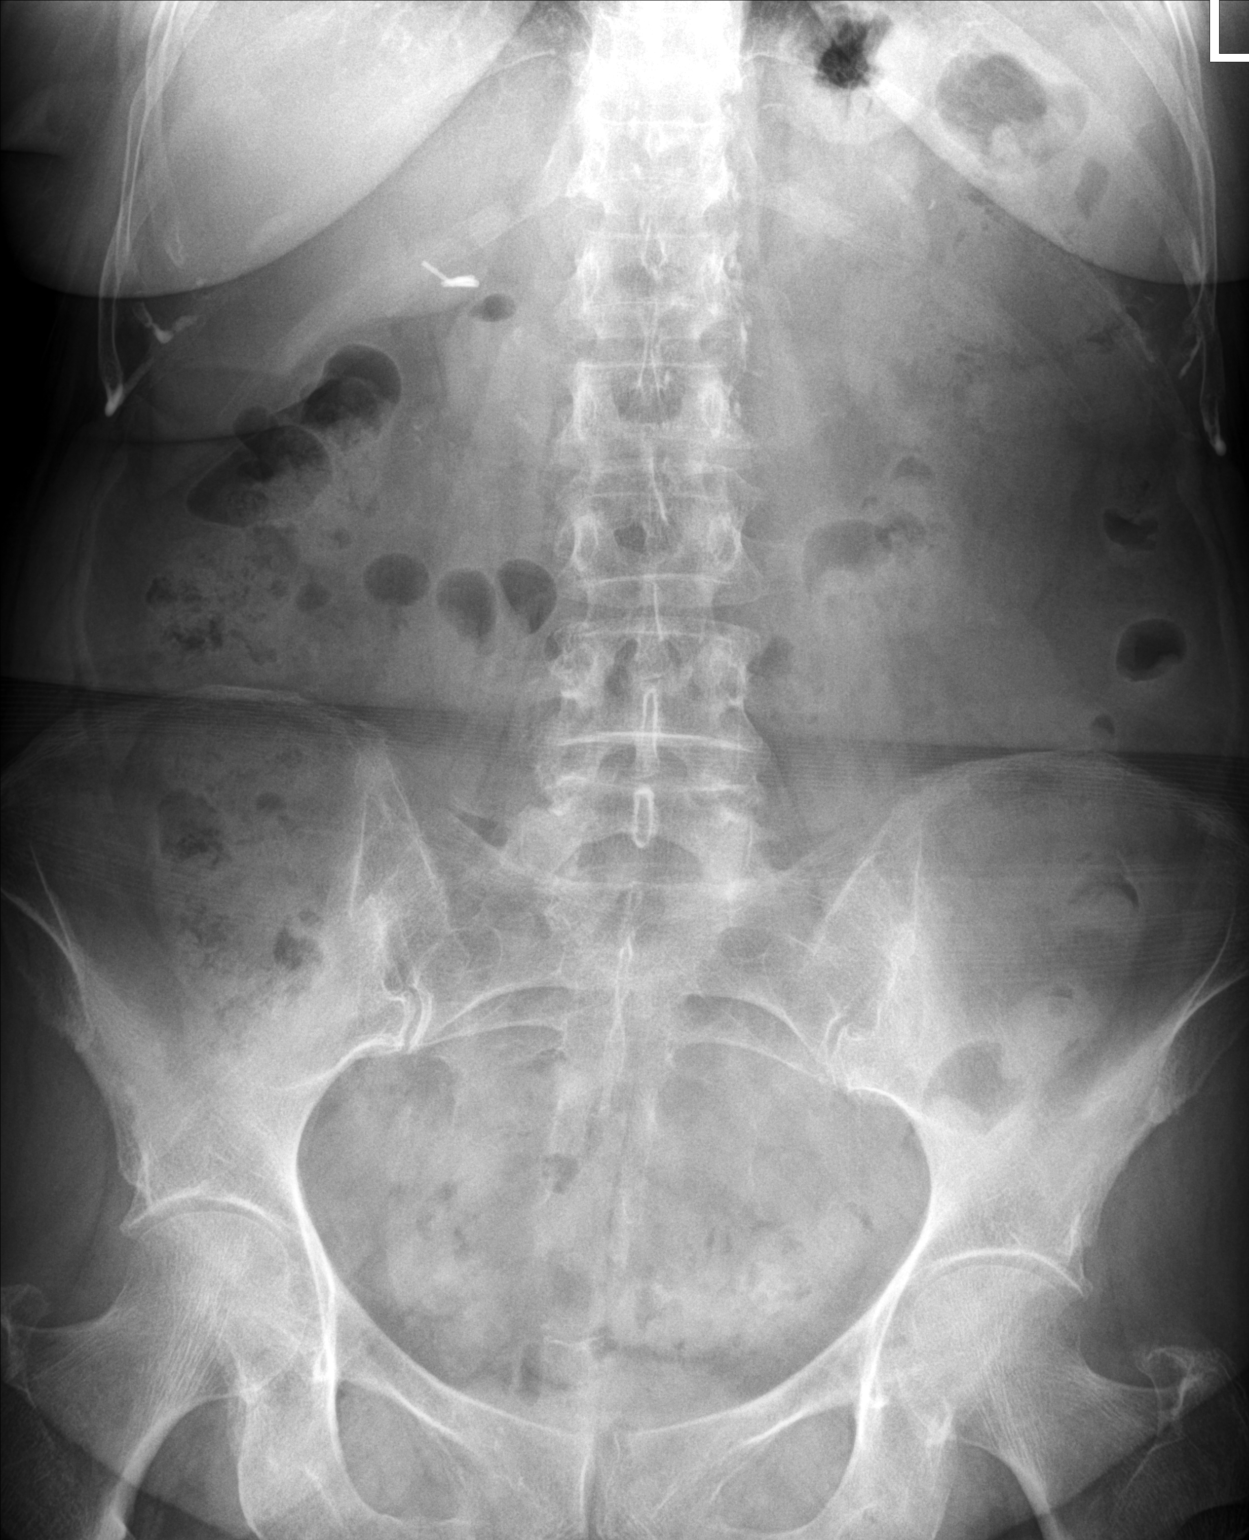

[l-spine lat]
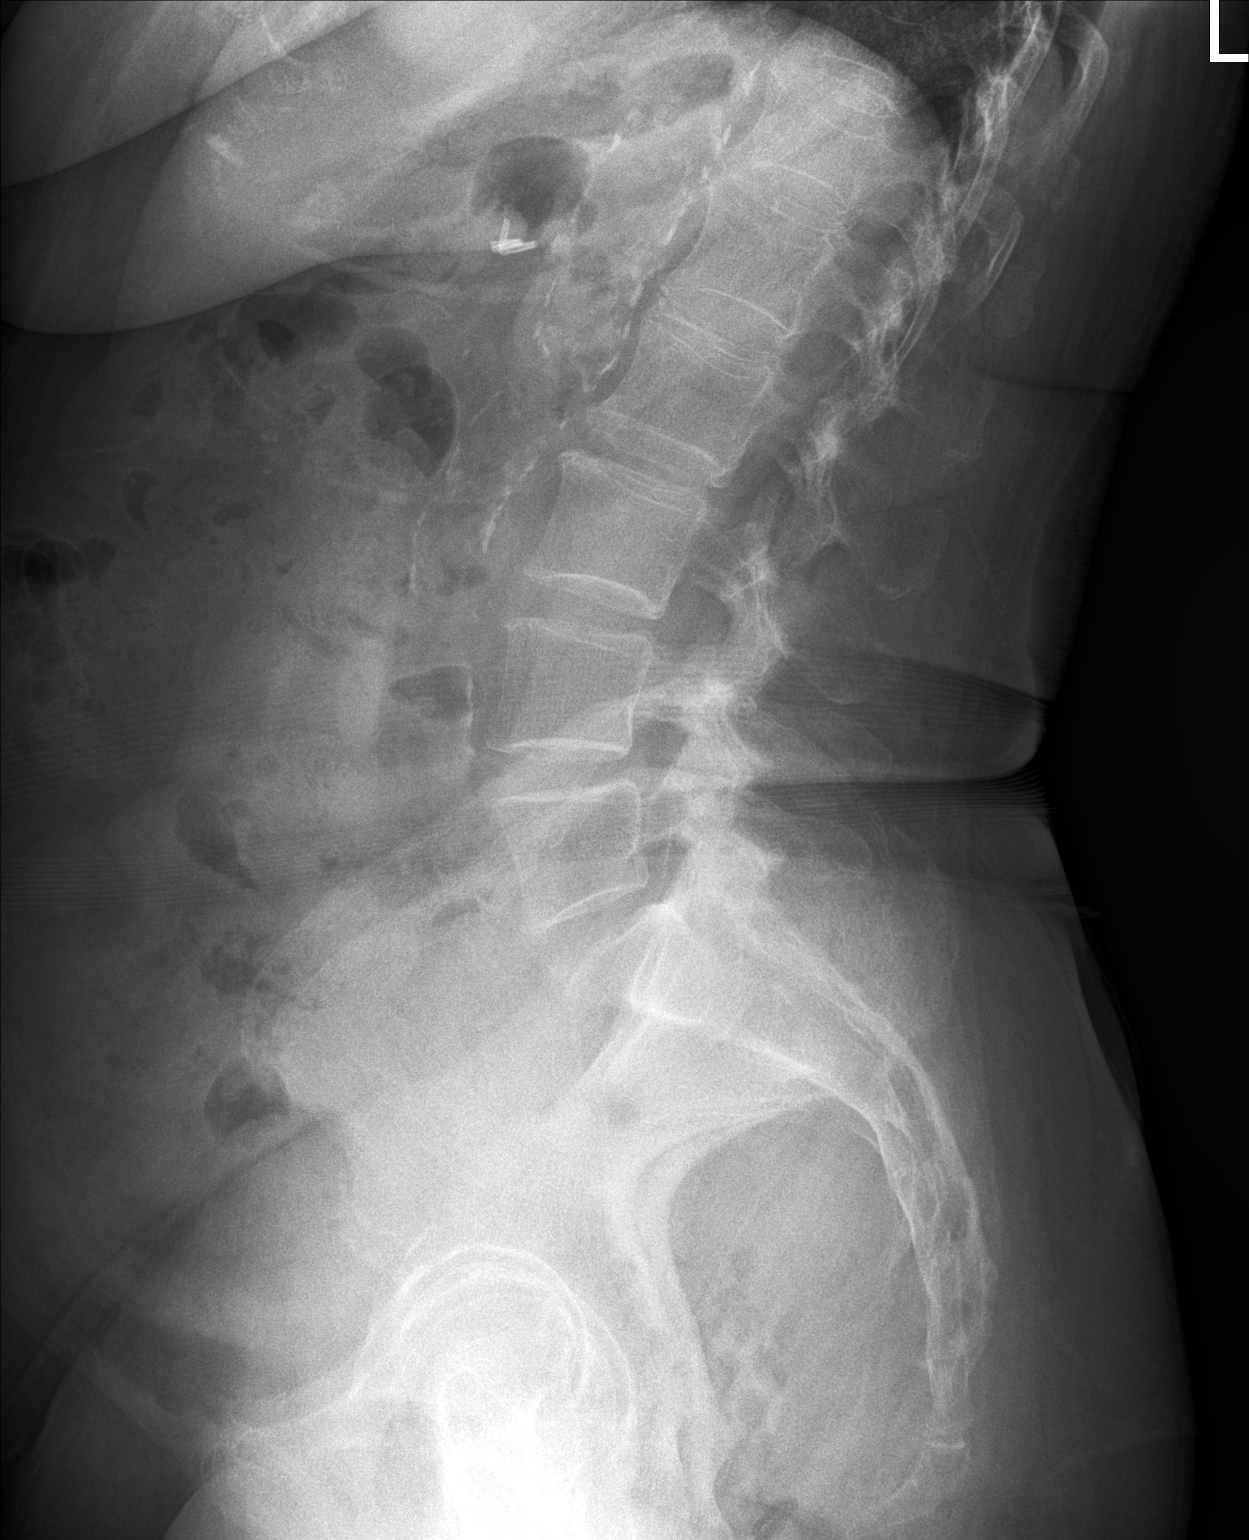

[2 of 2 positions shown; findings below may reference images not displayed]

FINDINGS: Frontal and lateral views of the lumbar spine are obtained. There
are 5 non-rib-bearing lumbar type vertebral bodies in normal
alignment. There are no acute fractures. Disc spaces are well
preserved. Mild facet hypertrophy at L4-5 and L5-S1. Sacroiliac
joints are normal. Diffuse atherosclerosis of the aorta.
IMPRESSION: 1. Lower lumbar facet hypertrophy.  No acute bony abnormality.

## 2022-04-02 ENCOUNTER — Ambulatory Visit (INDEPENDENT_AMBULATORY_CARE_PROVIDER_SITE_OTHER): Payer: Medicare HMO

## 2022-04-02 ENCOUNTER — Encounter: Payer: Self-pay | Admitting: Family Medicine

## 2022-04-02 ENCOUNTER — Ambulatory Visit (INDEPENDENT_AMBULATORY_CARE_PROVIDER_SITE_OTHER): Payer: Medicare HMO | Admitting: Family Medicine

## 2022-04-02 VITALS — BP 109/63 | HR 67 | Temp 98.1°F | Ht 62.0 in | Wt 176.5 lb

## 2022-04-02 DIAGNOSIS — S70362A Insect bite (nonvenomous), left thigh, initial encounter: Secondary | ICD-10-CM

## 2022-04-02 DIAGNOSIS — M25561 Pain in right knee: Secondary | ICD-10-CM

## 2022-04-02 DIAGNOSIS — W57XXXA Bitten or stung by nonvenomous insect and other nonvenomous arthropods, initial encounter: Secondary | ICD-10-CM | POA: Diagnosis not present

## 2022-04-02 MED ORDER — TRIAMCINOLONE ACETONIDE 0.5 % EX OINT
1.0000 | TOPICAL_OINTMENT | Freq: Two times a day (BID) | CUTANEOUS | 0 refills | Status: AC
Start: 2022-04-02 — End: ?

## 2022-04-02 MED ORDER — DICLOFENAC SODIUM 1 % EX GEL: 4.0000 g | Freq: Four times a day (QID) | CUTANEOUS | 1 refills | Status: DC

## 2022-04-02 NOTE — Progress Notes (Signed)
? ?  Acute Office Visit ? ?Subjective:  ? ?  ?Patient ID: Yvonne [name], female    DOB: January 23, 1951, 71 y.o.   MRN: [id number] ? ?Chief Complaint  ?Patient presents with  ? Knee Pain  ? ? ?HPI ?Patient is in today for right knee pain x 1 week. She reports that the pain is constant and achy. Pain is worse with walking or walking up stairs. The pain is a 8-10/10. She reports some popping in her knee. She denies fever, erythema, swelling, injury, or fall. She has been using a brace and heat. She has not tried any other remedies.  ? ?She also reports 2 tick bites to her left thigh that occurred months ago. They remain itchy however. She has been using rubbing alcohol and peroxide to the areas. She denies drainage, tenderness, or spreading erythema.  ? ?ROS ?As per HPI.  ? ?   ?Objective:  ?  ?BP 109/63   Pulse 67   Temp 98.1 ?F (36.7 ?C) (Temporal)   Ht '5\' 2"'$  (1.575 m)   Wt 176 lb 8 oz (80.1 kg)   SpO2 97%   BMI 32.28 kg/m?  ? ? ?Physical Exam ?Vitals and nursing note reviewed.  ?Constitutional:   ?   General: She is not in acute distress. ?   Appearance: She is not ill-appearing, toxic-appearing or diaphoretic.  ?Pulmonary:  ?   Effort: Pulmonary effort is normal. No respiratory distress.  ?Musculoskeletal:  ?   Right knee: Crepitus present. No swelling, deformity, effusion or erythema. Normal range of motion. Tenderness present over the medial joint line and lateral joint line. No patellar tendon tenderness. Normal alignment and normal patellar mobility.  ?   Instability Tests: Medial McMurray test negative and lateral McMurray test negative.  ?   Right lower leg: No edema.  ?   Left lower leg: No edema.  ?Skin: ?   General: Skin is warm and dry.  ?   Comments: Insect bite x 2 to left thigh. No surrounding erythema, induration, fluctuance, warmth, tenderness, or drainage present. No eythema migrans.   ?Neurological:  ?   General: No focal deficit present.  ?   Mental Status: She is alert and oriented to person,  place, and time.  ?Psychiatric:     ?   Mood and Affect: Mood normal.     ?   Behavior: Behavior normal.  ? ? ?No results found for any visits on 04/02/22. ? ? ?   ?Assessment & Plan:  ? ?Maddyson was seen today for knee pain. ? ?Diagnoses and all orders for this visit: ? ?Acute pain of right knee ??OA. Xray today in office, radiology report pending. Voltaren gel sample given. Tylenol, heat, ice, brace.  ?-     DG Knee 1-2 Views Right; Future ?-     diclofenac Sodium (VOLTAREN) 1 % GEL; Apply 4 g topically 4 (four) times daily. ? ?Tick bite of left thigh, initial encounter ?Kenalog as below for itching.  ?-     triamcinolone ointment (KENALOG) 0.5 %; Apply 1 application. topically 2 (two) times daily. ? ?Return if symptoms worsen or fail to improve. ? ?The patient indicates understanding of these issues and agrees with the plan. ? ?Gwenlyn Perking, FNP ? ? ?

## 2022-04-02 NOTE — Patient Instructions (Signed)
Acute Knee Pain, Adult Acute knee pain is sudden and may be caused by damage, swelling, or irritation of the muscles and tissues that support the knee. Pain may result from: A fall. An injury to the knee from twisting motions. A hit to the knee. Infection. Acute knee pain may go away on its own with time and rest. If it does not, your health care provider may order tests to find the cause of the pain. These may include: Imaging tests, such as an X-ray, MRI, CT scan, or ultrasound. Joint aspiration. In this test, fluid is removed from the knee and evaluated. Arthroscopy. In this test, a lighted tube is inserted into the knee and an image is projected onto a TV screen. Biopsy. In this test, a sample of tissue is removed from the body and studied under a microscope. Follow these instructions at home: If you have a knee sleeve or brace:  Wear the knee sleeve or brace as told by your health care provider. Remove it only as told by your health care provider. Loosen it if your toes tingle, become numb, or turn cold and blue. Keep it clean. If the knee sleeve or brace is not waterproof: Do not let it get wet. Cover it with a watertight covering when you take a bath or shower. Activity Rest your knee. Do not do things that cause pain or make pain worse. Avoid high-impact activities or exercises, such as running, jumping rope, or doing jumping jacks. Work with a physical therapist to make a safe exercise program, as recommended by your health care provider. Do exercises as told by your physical therapist. Managing pain, stiffness, and swelling  If directed, put ice on the affected knee. To do this: If you have a removable knee sleeve or brace, remove it as told by your health care provider. Put ice in a plastic bag. Place a towel between your skin and the bag. Leave the ice on for 20 minutes, 2-3 times a day. Remove the ice if your skin turns bright red. This is very important. If you cannot  feel pain, heat, or cold, you have a greater risk of damage to the area. If directed, use an elastic bandage to put pressure (compression) on your injured knee. This may control swelling, give support, and help with discomfort. Raise (elevate) your knee above the level of your heart while you are sitting or lying down. Sleep with a pillow under your knee. General instructions Take over-the-counter and prescription medicines only as told by your health care provider. Do not use any products that contain nicotine or tobacco, such as cigarettes, e-cigarettes, and chewing tobacco. If you need help quitting, ask your health care provider. If you are overweight, work with your health care provider and a dietitian to set a weight-loss goal that is healthy and reasonable for you. Extra weight can put pressure on your knee. Pay attention to any changes in your symptoms. Keep all follow-up visits. This is important. Contact a health care provider if: Your knee pain continues, changes, or gets worse. You have a fever along with knee pain. Your knee feels warm to the touch or is red. Your knee buckles or locks up. Get help right away if: Your knee swells, and the swelling becomes worse. You cannot move your knee. You have severe pain in your knee that cannot be managed with pain medicine. Summary Acute knee pain can be caused by a fall, an injury, an infection, or damage, swelling, or irritation   of the tissues that support your knee. Your health care provider may perform tests to find out the cause of the pain. Pay attention to any changes in your symptoms. Relieve your pain with rest, medicines, light activity, and the use of ice. Get help right away if your knee swells, you cannot move your knee, or you have severe pain that cannot be managed with medicine. This information is not intended to replace advice given to you by your health care provider. Make sure you discuss any questions you have with  your health care provider. Document Revised: 04/27/2020 Document Reviewed: 04/27/2020 Elsevier Patient Education  2023 [employer]  

## 2022-04-10 ENCOUNTER — Ambulatory Visit (INDEPENDENT_AMBULATORY_CARE_PROVIDER_SITE_OTHER): Payer: Medicare HMO | Admitting: Nurse Practitioner

## 2022-04-10 ENCOUNTER — Encounter: Payer: Self-pay | Admitting: Nurse Practitioner

## 2022-04-10 VITALS — BP 118/67 | HR 72 | Temp 97.8°F | Ht 62.0 in | Wt 177.6 lb

## 2022-04-10 DIAGNOSIS — R609 Edema, unspecified: Secondary | ICD-10-CM | POA: Diagnosis not present

## 2022-04-10 DIAGNOSIS — F411 Generalized anxiety disorder: Secondary | ICD-10-CM

## 2022-04-10 DIAGNOSIS — R600 Localized edema: Secondary | ICD-10-CM | POA: Insufficient documentation

## 2022-04-10 DIAGNOSIS — F322 Major depressive disorder, single episode, severe without psychotic features: Secondary | ICD-10-CM | POA: Diagnosis not present

## 2022-04-10 MED ORDER — ESCITALOPRAM OXALATE 5 MG PO TABS: 5.0000 mg | ORAL_TABLET | Freq: Every day | ORAL | 0 refills | Status: DC

## 2022-04-10 NOTE — Assessment & Plan Note (Signed)
Elevated bilateral feet, continue diuretic as prescribed, compression socks DME completed, low sodium diet. Follow up with unresolved symptoms ?

## 2022-04-10 NOTE — Assessment & Plan Note (Signed)
Symptoms not well controlled , completed phq-9 , symptoms are worse due to family /social issues. Started patient on Lexapro 5 mg tablet by mouth daily follow up in 6 weeks ?Education provided on stress management.  ?

## 2022-04-10 NOTE — Patient Instructions (Signed)
Major Depressive Disorder, Adult ?Major depressive disorder is a mental health condition. This disorder affects feelings. It can also affect the body. Symptoms of this condition last most of the day, almost every day, for 2 weeks. This disorder can affect: ?Relationships. ?Daily activities, such as work and school. ?Activities that you normally like to do. ?What are the causes? ?The cause of this condition is not known. The disorder is likely caused by a mix of things, including: ?Your personality, such as being a shy person. ?Your behavior, or how you act toward others. ?Your thoughts and feelings. ?Too much alcohol or drugs. ?How you react to stress. ?Health and mental problems that you have had for a long time. ?Things that hurt you in the past (trauma). ?Big changes in your life, such as divorce. ?What increases the risk? ?The following factors may make you more likely to develop this condition: ?Having family members with depression. ?Being a woman. ?Problems in the family. ?Low levels of some brain chemicals. ?Things that caused you pain as a child, especially if you lost a parent or were abused. ?A lot of stress in your life, such as from: ?Living without basic needs of life, such as food and shelter. ?Being treated poorly because of race, sex, or religion (discrimination). ?Health and mental problems that you have had for a long time. ?What are the signs or symptoms? ?The main symptoms of this condition are: ?Being sad all the time. ?Being grouchy all the time. ?Loss of interest in things and activities. ?Other symptoms include: ?Sleeping too much or too little. ?Eating too much or too little. ?Gaining or losing weight, without knowing why. ?Feeling tired or having low energy. ?Being restless and weak. ?Feeling hopeless, worthless, or guilty. ?Trouble thinking clearly or making decisions. ?Thoughts of hurting yourself or others, or thoughts of ending your life. ?Spending a lot of time alone. ?Inability to  complete common tasks of daily life. ?If you have very bad MDD, you may: ?Believe things that are not true. ?Hear, see, taste, or feel things that are not there. ?Have mild depression that lasts for at least 2 years. ?Feel very sad and hopeless. ?Have trouble speaking or moving. ?How is this treated? ?This condition may be treated with: ?Talk therapy. This teaches you to know bad thoughts, feelings, and actions and how to change them. ?This can also help you to communicate with others. ?This can be done with members of your family. ?Medicines. These can be used to treat worry (anxiety), depression, or low levels of chemicals in the brain. ?Lifestyle changes. You may need to: ?Limit alcohol use. ?Limit drug use. ?Get regular exercise. ?Get plenty of sleep. ?Make healthy eating choices. ?Spend more time outdoors. ?Brain stimulation. This treatment excites the brain. This is done when symptoms are very bad or have not gotten better with other treatments. ?Follow these instructions at home: ?Activity ?Get regular exercise as told. ?Spend time outdoors as told. ?Make time to do the things you enjoy. ?Find ways to deal with stress. Try to: ?Meditate. ?Do deep breathing. ?Spend time in nature. ?Keep a journal. ?Return to your normal activities as told by your doctor. Ask your doctor what activities are safe for you. ?Alcohol and drug use ?If you drink alcohol: ?Limit how much you use to: ?0-1 drink a day for women. ?0-2 drinks a day for men. ?Be aware of how much alcohol is in your drink. In the [place] one drink equals one 12 oz bottle of beer (355 mL),  one 5 oz glass of wine (148 mL), or one 1? oz glass of hard liquor (44 mL). ?Talk to your doctor about: ?Alcohol use. Alcohol can affect some medicines. ?Any drug use. ?General instructions ? ?Take over-the-counter and prescription medicines and herbal preparations only as told by your doctor. ?Eat a healthy diet. ?Get a lot of sleep. ?Think about joining a support group.  Your doctor may be able to suggest one. ?Keep all follow-up visits as told by your doctor. This is important. ?Where to find more information: ?National Alliance on Mental Illness: www.nami.org ?[employer] of Mental Health: https://carter.com/ ?American Psychiatric Association: www.psychiatry.org/patients-families/ ?Contact a doctor if: ?Your symptoms get worse. ?You get new symptoms. ?Get help right away if: ?You hurt yourself. ?You have serious thoughts about hurting yourself or others. ?You see, hear, taste, smell, or feel things that are not there. ?If you ever feel like you may hurt yourself or others, or have thoughts about taking your own life, get help right away. Go to your nearest emergency department or: ?Call your local emergency services (911 in the [place] ?Call a suicide crisis helpline, such as the [facility] at 907-069-9023 or 988 in the [place]. This is open 24 hours a day in the [place] ?Text the Crisis Text Line at (484)693-3190 (in the [place].). ?Summary ?Major depressive disorder is a mental health condition. This disorder affects feelings. Symptoms of this condition last most of the day, almost every day, for 2 weeks. ?The symptoms of this disorder can cause problems with relationships and with daily activities. ?There are treatments and support for people who get this disorder. You may need more than one type of treatment. ?Get help right away if you have serious thoughts about hurting yourself or others. ?This information is not intended to replace advice given to you by your health care provider. Make sure you discuss any questions you have with your health care provider. ?Document Revised: 06/07/2021 Document Reviewed: 10/24/2019 ?Elsevier Patient Education ? [place]. ? ? ?Edema ? ?Edema is when you have too much fluid in your body or under your skin. Edema may make your legs, feet, and ankles swell. Swelling often happens in looser tissues, such as around  your eyes. This is a common condition. It gets more common as you get older. ?There are many possible causes of edema. These include: ?Eating too much salt (sodium). ?Being on your feet or sitting for a long time. ?Certain medical conditions, such as: ?Pregnancy. ?Heart failure. ?Liver disease. ?Kidney disease. ?Cancer. ?Hot weather may make edema worse. Edema is usually painless. Your skin may look swollen or shiny. ?Follow these instructions at home: ?Medicines ?Take over-the-counter and prescription medicines only as told by your doctor. ?Your doctor may prescribe a medicine to help your body get rid of extra water (diuretic). Take this medicine if you are told to take it. ?Eating and drinking ?Eat a low-salt (low-sodium) diet as told by your doctor. Sometimes, eating less salt may reduce swelling. ?Depending on the cause of your swelling, you may need to limit how much fluid you drink (fluid restriction). ?General instructions ?Raise the injured area above the level of your heart while you are sitting or lying down. ?Do not sit still or stand for a long time. ?Do not wear tight clothes. Do not wear garters on your upper legs. ?Exercise your legs. This can help the swelling go down. ?Wear compression stockings as told by your doctor. It is important that these are  the right size. These should be prescribed by your doctor to prevent possible injuries. ?If elastic bandages or wraps are recommended, use them as told by your doctor. ?Contact a doctor if: ?Treatment is not working. ?You have heart, liver, or kidney disease and have symptoms of edema. ?You have sudden and unexplained weight gain. ?Get help right away if: ?You have shortness of breath or chest pain. ?You cannot breathe when you lie down. ?You have pain, redness, or warmth in the swollen areas. ?You have heart, liver, or kidney disease and get edema all of a sudden. ?You have a fever and your symptoms get worse all of a sudden. ?These symptoms may be an  emergency. Get help right away. Call 911. ?Do not wait to see if the symptoms will go away. ?Do not drive yourself to the hospital. ?Summary ?Edema is when you have too much fluid in your body or under your

## 2022-04-10 NOTE — Progress Notes (Signed)
? ?Acute Office Visit ? ?Subjective:  ? ?  ?Patient ID: Yvonne [name], female    DOB: 1951/01/07, 71 y.o.   MRN: [id number] ? ?Chief Complaint  ?Patient presents with  ? swelling in Legs  ? ? ?HPI ?Patient is in today for Edema: Patient complains of edema. The location of the edema is feet bilateral.  The edema has been mild.  Onset of symptoms was a few weeks ago, unchanged since that time. The edema is present intermittently. The patient states never.  The swelling has been aggravated by nothing, relieved by diuretics, low-salt diet, and been associated with nothing. Cardiac risk factors include advanced age (older than 68 for men, 45 for women), hypertension, and obesity (BMI >= 30 kg/m2).  ? ? ?Anxiety, Follow-up ? ?She was last seen for anxiety few weeks ago. ?Changes made at last visit include continue xanax as prescribed. ?  ?She reports fair compliance with treatment. ?She reports fair tolerance of treatment. ?She is not having side effects.  ? ?She feels her anxiety is severe and Worse since last visit. ? ?Symptoms: ?No chest pain Yes difficulty concentrating  ?No dizziness Yes fatigue  ?Yes feelings of losing control Yes insomnia  ?Yes irritable No palpitations  ?No panic attacks No racing thoughts  ?No shortness of breath No sweating  ?No tremors/shakes   ? ?GAD-7 Results ? ?  04/10/2022  ?  9:47 AM 04/02/2022  ?  9:04 AM 01/30/2022  ? 10:47 AM  ?GAD-7 Generalized Anxiety Disorder Screening Tool  ?1. Feeling Nervous, Anxious, or on Edge '3 3 3  '$ ?2. Not Being Able to Stop or Control Worrying '3 3 3  '$ ?3. Worrying Too Much About Different Things '3 3 3  '$ ?4. Trouble Relaxing '3 2 3  '$ ?5. Being So Restless it's Hard To Sit Still '2 2 2  '$ ?6. Becoming Easily Annoyed or Irritable '3 3 3  '$ ?7. Feeling Afraid As If Something Awful Might Happen '3 3 3  '$ ?Total GAD-7 Score '20 19 20  '$ ?Difficulty At Work, Home, or Getting  Along With Others? Very difficult Very difficult Very difficult  ? ? ?PHQ-9 Scores ? ?  04/10/2022  ?  9:46 AM  04/02/2022  ?  9:02 AM 03/01/2022  ? 11:22 AM  ?PHQ9 SCORE ONLY  ?PHQ-9 Total Score '25 22 17  '$ ? ? ? ?Depression: Patient complains of depression. She complains of depressed mood, fatigue, feelings of worthlessness/guilt, and suicidal thoughts without plan. Onset was approximately a few year ago, gradually worsening since that time.  She denies current suicidal and homicidal plan or intent.   Family history significant for no psychiatric illness.Possible organic causes contributing are: none.  Risk factors: previous episode of depression Previous treatment includes Xanax  She complains of the following side effects from the treatment: none.  ? ?[facility] Office Visit from 04/10/2022 in [facility]  ?PHQ-9 Total Score 25  ? ?  ?  ? ?Review of Systems  ?HENT: Negative.    ?Respiratory: Negative.    ?Cardiovascular: Negative.   ?Gastrointestinal: Negative.   ?Genitourinary: Negative.   ?Skin: Negative.   ?Psychiatric/Behavioral:  Positive for depression. The patient is nervous/anxious.   ?All other systems reviewed and are negative. ? ? ?   ?Objective:  ?  ?BP 118/67   Pulse 72   Temp 97.8 ?F (36.6 ?C)   Ht '5\' 2"'$  (1.575 m)   Wt 177 lb 9.6 oz (80.6 kg)   SpO2 98%   BMI  32.48 kg/m?  ?BP Readings from Last 3 Encounters:  ?04/10/22 118/67  ?04/02/22 109/63  ?03/01/22 98/63  ? ?Wt Readings from Last 3 Encounters:  ?04/10/22 177 lb 9.6 oz (80.6 kg)  ?04/02/22 176 lb 8 oz (80.1 kg)  ?03/01/22 172 lb (78 kg)  ? ?  ? ?Physical Exam ?Vitals and nursing note reviewed.  ?Constitutional:   ?   Appearance: Normal appearance.  ?HENT:  ?   Head: Normocephalic.  ?   Left Ear: External ear normal.  ?   Nose: Nose normal.  ?   Mouth/Throat:  ?   Mouth: Mucous membranes are moist.  ?   Pharynx: Oropharynx is clear.  ?Eyes:  ?   Conjunctiva/sclera: Conjunctivae normal.  ?Cardiovascular:  ?   Rate and Rhythm: Normal rate and regular rhythm.  ?   Pulses: Normal pulses.  ?   Heart sounds: Normal heart sounds.   ?Pulmonary:  ?   Effort: Pulmonary effort is normal.  ?   Breath sounds: Normal breath sounds.  ?Abdominal:  ?   General: Bowel sounds are normal.  ?Skin: ?   General: Skin is warm.  ?   Findings: No rash.  ?Neurological:  ?   General: No focal deficit present.  ?   Mental Status: She is alert and oriented to person, place, and time.  ?Psychiatric:     ?   Attention and Perception: Attention and perception normal.     ?   Mood and Affect: Mood is anxious and depressed.     ?   Behavior: Behavior normal. Behavior is cooperative.     ?   Thought Content: Thought content includes suicidal ideation. Thought content does not include homicidal ideation. Thought content does not include homicidal or suicidal plan.     ?   Cognition and Memory: Memory normal.     ?   Judgment: Judgment normal.  ? ? ?No results found for any visits on 04/10/22. ? ? ?   ?Assessment & Plan:  ? ?Problem List Items Addressed This Visit   ? ?  ? Other  ? GAD (generalized anxiety disorder)  ? Relevant Medications  ? escitalopram (LEXAPRO) 5 MG tablet  ? Depression, major, single episode, severe ([place])  ?  Symptoms not well controlled , completed phq-9 , symptoms are worse due to family /social issues. Started patient on Lexapro 5 mg tablet by mouth daily follow up in 6 weeks ?Education provided on stress management.  ? ?  ?  ? Relevant Medications  ? escitalopram (LEXAPRO) 5 MG tablet  ? Peripheral edema - Primary  ?  Elevated bilateral feet, continue diuretic as prescribed, compression socks DME completed, low sodium diet. Follow up with unresolved symptoms ? ?  ?  ? Relevant Orders  ? For home use only DME Other see comment  ? ? ?Meds ordered this encounter  ?Medications  ? escitalopram (LEXAPRO) 5 MG tablet  ?  Sig: Take 1 tablet (5 mg total) by mouth daily.  ?  Dispense:  60 tablet  ?  Refill:  0  ?  Order Specific Question:   Supervising Provider  ?  AnswerClaretta Fraise [[id number]]  ? ? ?Return if symptoms worsen or fail to  improve. ? ?Ivy Lynn, NP ? ? ?

## 2022-04-15 DIAGNOSIS — Z79899 Other long term (current) drug therapy: Secondary | ICD-10-CM | POA: Diagnosis not present

## 2022-04-15 DIAGNOSIS — E785 Hyperlipidemia, unspecified: Secondary | ICD-10-CM | POA: Diagnosis not present

## 2022-04-15 DIAGNOSIS — Z888 Allergy status to other drugs, medicaments and biological substances status: Secondary | ICD-10-CM | POA: Diagnosis not present

## 2022-04-15 DIAGNOSIS — S9032XA Contusion of left foot, initial encounter: Secondary | ICD-10-CM | POA: Diagnosis not present

## 2022-04-15 DIAGNOSIS — Z880 Allergy status to penicillin: Secondary | ICD-10-CM | POA: Diagnosis not present

## 2022-04-15 DIAGNOSIS — I10 Essential (primary) hypertension: Secondary | ICD-10-CM | POA: Diagnosis not present

## 2022-04-15 DIAGNOSIS — Z7984 Long term (current) use of oral hypoglycemic drugs: Secondary | ICD-10-CM | POA: Diagnosis not present

## 2022-04-15 DIAGNOSIS — M25572 Pain in left ankle and joints of left foot: Secondary | ICD-10-CM | POA: Diagnosis not present

## 2022-04-15 DIAGNOSIS — Z91048 Other nonmedicinal substance allergy status: Secondary | ICD-10-CM | POA: Diagnosis not present

## 2022-04-15 DIAGNOSIS — M79672 Pain in left foot: Secondary | ICD-10-CM | POA: Diagnosis not present

## 2022-04-15 DIAGNOSIS — E119 Type 2 diabetes mellitus without complications: Secondary | ICD-10-CM | POA: Diagnosis not present

## 2022-05-03 ENCOUNTER — Encounter: Payer: Self-pay | Admitting: Family Medicine

## 2022-05-03 ENCOUNTER — Ambulatory Visit (INDEPENDENT_AMBULATORY_CARE_PROVIDER_SITE_OTHER): Payer: Medicare HMO | Admitting: Family Medicine

## 2022-05-03 VITALS — BP 111/65 | HR 70 | Temp 98.0°F | Ht 62.0 in | Wt 179.4 lb

## 2022-05-03 DIAGNOSIS — E1122 Type 2 diabetes mellitus with diabetic chronic kidney disease: Secondary | ICD-10-CM

## 2022-05-03 DIAGNOSIS — N1831 Chronic kidney disease, stage 3a: Secondary | ICD-10-CM

## 2022-05-03 DIAGNOSIS — I10 Essential (primary) hypertension: Secondary | ICD-10-CM

## 2022-05-03 DIAGNOSIS — E785 Hyperlipidemia, unspecified: Secondary | ICD-10-CM | POA: Diagnosis not present

## 2022-05-03 DIAGNOSIS — F322 Major depressive disorder, single episode, severe without psychotic features: Secondary | ICD-10-CM | POA: Diagnosis not present

## 2022-05-03 DIAGNOSIS — F411 Generalized anxiety disorder: Secondary | ICD-10-CM

## 2022-05-03 DIAGNOSIS — F1320 Sedative, hypnotic or anxiolytic dependence, uncomplicated: Secondary | ICD-10-CM

## 2022-05-03 LAB — BAYER DCA HB A1C WAIVED: HB A1C (BAYER DCA - WAIVED): 8.1 % — ABNORMAL HIGH (ref 4.8–5.6)

## 2022-05-03 MED ORDER — ALPRAZOLAM 1 MG PO TABS
ORAL_TABLET | ORAL | 5 refills | Status: DC
Start: 2022-05-03 — End: 2022-11-07

## 2022-05-03 MED ORDER — ESCITALOPRAM OXALATE 10 MG PO TABS: 10.0000 mg | ORAL_TABLET | Freq: Every day | ORAL | 3 refills | Status: DC

## 2022-05-03 NOTE — Progress Notes (Unsigned)
Subjective:  Patient ID: Yvonne [name],  female    DOB: [birth year]  Age: 71 y.o.    CC: Diabetes   HPI Yvonne [name] presents for  follow-up of hypertension. Patient has no history of headache chest pain or shortness of breath or recent cough. Patient also denies symptoms of TIA such as numbness weakness lateralizing. Patient denies side effects from medication. States taking it regularly.  Patient also  in for follow-up of elevated cholesterol. Doing well without complaints on current medication. Denies side effects  including myalgia and arthralgia and nausea. Also in today for liver function testing. Currently no chest pain, shortness of breath or other cardiovascular related symptoms noted.  Follow-up of diabetes. Patient does check blood sugar at home. Readings run between *** and *** Patient denies symptoms such as excessive hunger or urinary frequency, excessive hunger, nausea No significant hypoglycemic spells noted. Medications reviewed. Pt reports taking them regularly. Pt. denies complication/adverse reaction today.    History Yvonne [name] has a past medical history of Anxiety, Arthritis, Asthma, Chronic cough, Chronic otitis media (12/2017), Full dentures, GERD (gastroesophageal reflux disease), History of stroke (09/2017), Hyperlipidemia, Hypertension, Non-insulin dependent type 2 diabetes mellitus ([place]), Overactive bladder, Recurrent acute suppurative otitis media without spontaneous rupture of left tympanic membrane (02/19/2018), Stroke ([place]), and Transient cerebral ischemia.   She has a past surgical history that includes Cholecystectomy; Colonoscopy (N/A, 08/17/2015); Abdominal hysterectomy; Lacrimal duct exploration (Bilateral); Cataract extraction w/ intraocular lens implant (Right); Vitrectomy (Left, 09/14/2015); Gas insertion (Right); and Myringotomy with tube placement (Bilateral, 01/28/2018).   Her family history includes Arthritis in her brother and mother; Arthritis-Osteo in  her son; Asthma in her father; CVA in her mother; Cancer in her brother; Diabetes in her father and mother; Heart disease in her father; Hepatitis C in her sister; Peripheral Artery Disease in her daughter; Stroke in her mother.She reports that she has never smoked. She has never used smokeless tobacco. She reports that she does not currently use alcohol. She reports that she does not use drugs.  Current Outpatient Medications on File Prior to Visit  Medication Sig Dispense Refill   ACCU-CHEK GUIDE test strip TEST BLOOD SUGAR 4 TIMES DAILY DX E11.69 400 strip 3   Accu-Chek Softclix Lancets lancets Test BS up to 4 times daily Dx E11.69 400 each 3   albuterol (VENTOLIN HFA) 108 (90 Base) MCG/ACT inhaler TAKE 2 PUFFS BY MOUTH EVERY 6 HOURS AS NEEDED FOR WHEEZE OR SHORTNESS OF BREATH 54 each 0   alendronate (FOSAMAX) 70 MG tablet TAKE 1 TABLET EVERY 7 DAYS WITH A FULL GLASS OF WATER ON AN EMPTY STOMACH 12 tablet 1   amLODipine (NORVASC) 10 MG tablet TAKE 1 TABLET BY MOUTH EVERY DAY 90 tablet 3   aspirin 81 MG chewable tablet Chew 1 tablet (81 mg total) by mouth daily. X 90 days 90 tablet 0   atorvastatin (LIPITOR) 80 MG tablet Take 1 tablet (80 mg total) by mouth daily. 90 tablet 2   Bempedoic Acid-Ezetimibe (NEXLIZET) 180-10 MG TABS Take 180 mg by mouth daily. 30 tablet 6   Blood Glucose Monitoring Suppl (ACCU-CHEK GUIDE) w/Device KIT Test Bs QID Dx E11.69 1 kit 0   clopidogrel (PLAVIX) 75 MG tablet TAKE 1 TABLET BY MOUTH EVERY DAY WITH BREAKFAST 90 tablet 3   dapagliflozin propanediol (FARXIGA) 10 MG TABS tablet Take 1 tablet (10 mg total) by mouth daily. 90 tablet 4   diclofenac Sodium (VOLTAREN) 1 % GEL Apply 4 g topically 4 (  four) times daily. 50 g 1   famotidine (PEPCID) 20 MG tablet Take 20 mg by mouth at bedtime.     Fexofenadine HCl (ALLEGRA PO) Take 1 tablet by mouth daily.     furosemide (LASIX) 20 MG tablet Take 1 tablet (20 mg total) by mouth daily. 30 tablet 3   glipiZIDE (GLUCOTROL) 5  MG tablet Take 1 tablet (5 mg total) by mouth daily before breakfast. 90 tablet 3   loratadine (CLARITIN) 10 MG tablet Take 1 tablet (10 mg total) by mouth daily. 30 tablet 11   nitroGLYCERIN (NITROSTAT) 0.4 MG SL tablet Place 1 tablet (0.4 mg total) under the tongue every 5 (five) minutes as needed for chest pain. 25 tablet 10   oxybutynin (DITROPAN-XL) 5 MG 24 hr tablet Take 5 mg by mouth at bedtime.     Semaglutide, 2 MG/DOSE, (OZEMPIC, 2 MG/DOSE,) 8 MG/3ML SOPN Inject 2 mg into the skin once a week. 9 mL 3   triamcinolone ointment (KENALOG) 0.5 % Apply 1 application. topically 2 (two) times daily. 30 g 0   vitamin B-12 (CYANOCOBALAMIN) 500 MCG tablet Take 1 tablet (500 mcg total) by mouth daily.     No current facility-administered medications on file prior to visit.    ROS Review of Systems  Constitutional: Negative.   HENT: Negative.    Eyes:  Negative for visual disturbance.  Respiratory:  Negative for shortness of breath.   Cardiovascular:  Negative for chest pain.  Gastrointestinal:  Negative for abdominal pain.  Musculoskeletal:  Positive for arthralgias (left foot pain from injury. Negative XR.).    Objective:  BP 111/65   Pulse 70   Temp 98 F (36.7 C)   Ht $R'5\' 2"'xU$  (1.575 m)   Wt 179 lb 6.4 oz (81.4 kg)   SpO2 98%   BMI 32.81 kg/m   BP Readings from Last 3 Encounters:  05/03/22 111/65  04/10/22 118/67  04/02/22 109/63    Wt Readings from Last 3 Encounters:  05/03/22 179 lb 6.4 oz (81.4 kg)  04/10/22 177 lb 9.6 oz (80.6 kg)  04/02/22 176 lb 8 oz (80.1 kg)     Physical Exam  Diabetic Foot Exam - Simple   No data filed     Lab Results  Component Value Date   HGBA1C 8.1 (H) 05/03/2022   HGBA1C 7.9 (H) 01/17/2022   HGBA1C 7.8 (H) 11/30/2021    Assessment & Plan:   Yvonne [name] was seen today for diabetes.  Diagnoses and all orders for this visit:  Primary hypertension -     CBC with Differential/Platelet -     CMP14+EGFR  Type 2 diabetes mellitus  with stage 3a chronic kidney disease, without long-term current use of insulin (HCC) -     Bayer DCA Hb A1c Waived  Hyperlipidemia with target LDL less than 70 -     Lipid panel  GAD (generalized anxiety disorder) -     ALPRAZolam (XANAX) 1 MG tablet; ! Each morning, one each evening, and 1/2 each afternoon -     escitalopram (LEXAPRO) 10 MG tablet; Take 1 tablet (10 mg total) by mouth daily.  Benzodiazepine dependence, continuous (HCC) -     ALPRAZolam (XANAX) 1 MG tablet; ! Each morning, one each evening, and 1/2 each afternoon  Depression, major, single episode, severe (HCC) -     escitalopram (LEXAPRO) 10 MG tablet; Take 1 tablet (10 mg total) by mouth daily.   I have changed Yvonne [name]. Yvonne [name]'s escitalopram. I am  also having her maintain her Accu-Chek Softclix Lancets, atorvastatin, nitroGLYCERIN, famotidine, Accu-Chek Guide, amLODipine, glipiZIDE, clopidogrel, Ozempic (2 MG/DOSE), dapagliflozin propanediol, Fexofenadine HCl (ALLEGRA PO), vitamin B-12, aspirin, oxybutynin, Nexlizet, loratadine, alendronate, albuterol, Accu-Chek Guide, furosemide, triamcinolone ointment, diclofenac Sodium, and ALPRAZolam.  Meds ordered this encounter  Medications   ALPRAZolam (XANAX) 1 MG tablet    Sig: ! Each morning, one each evening, and 1/2 each afternoon    Dispense:  75 tablet    Refill:  5   escitalopram (LEXAPRO) 10 MG tablet    Sig: Take 1 tablet (10 mg total) by mouth daily.    Dispense:  90 tablet    Refill:  3     Follow-up: Return in about 3 months (around 08/03/2022).  [name], M.D.

## 2022-05-04 LAB — CMP14+EGFR
ALT: 37 IU/L — ABNORMAL HIGH (ref 0–32)
AST: 31 IU/L (ref 0–40)
Albumin/Globulin Ratio: 1.8 (ref 1.2–2.2)
Albumin: 4.1 g/dL (ref 3.7–4.7)
Alkaline Phosphatase: 91 IU/L (ref 44–121)
BUN/Creatinine Ratio: 20 (ref 12–28)
BUN: 17 mg/dL (ref 8–27)
Bilirubin Total: 0.4 mg/dL (ref 0.0–1.2)
CO2: 23 mmol/L (ref 20–29)
Calcium: 9.5 mg/dL (ref 8.7–10.3)
Chloride: 103 mmol/L (ref 96–106)
Creatinine, Ser: 0.85 mg/dL (ref 0.57–1.00)
Globulin, Total: 2.3 g/dL (ref 1.5–4.5)
Glucose: 257 mg/dL — ABNORMAL HIGH (ref 70–99)
Potassium: 4.6 mmol/L (ref 3.5–5.2)
Sodium: 139 mmol/L (ref 134–144)
Total Protein: 6.4 g/dL (ref 6.0–8.5)
eGFR: 73 mL/min/{1.73_m2} (ref >59)

## 2022-05-04 LAB — CBC WITH DIFFERENTIAL/PLATELET
Basophils Absolute: 0.1 10*3/uL (ref 0.0–0.2)
Basos: 1 %
EOS (ABSOLUTE): 0.1 10*3/uL (ref 0.0–0.4)
Eos: 2 %
Hematocrit: 39.4 % (ref 34.0–46.6)
Hemoglobin: 13.2 g/dL (ref 11.1–15.9)
Immature Grans (Abs): 0 10*3/uL (ref 0.0–0.1)
Immature Granulocytes: 0 %
Lymphocytes Absolute: 2.3 10*3/uL (ref 0.7–3.1)
Lymphs: 40 %
MCH: 29.7 pg (ref 26.6–33.0)
MCHC: 33.5 g/dL (ref 31.5–35.7)
MCV: 89 fL (ref 79–97)
Monocytes Absolute: 0.4 10*3/uL (ref 0.1–0.9)
Monocytes: 8 %
Neutrophils Absolute: 2.8 10*3/uL (ref 1.4–7.0)
Neutrophils: 49 %
Platelets: 244 10*3/uL (ref 150–450)
RBC: 4.44 x10E6/uL (ref 3.77–5.28)
RDW: 12.5 % (ref 11.7–15.4)
WBC: 5.7 10*3/uL (ref 3.4–10.8)

## 2022-05-04 LAB — LIPID PANEL
Chol/HDL Ratio: 2 ratio (ref 0.0–4.4)
Cholesterol, Total: 106 mg/dL (ref 100–199)
HDL: 53 mg/dL (ref >39)
LDL Chol Calc (NIH): 36 mg/dL (ref 0–99)
Triglycerides: 87 mg/dL (ref 0–149)
VLDL Cholesterol Cal: 17 mg/dL (ref 5–40)

## 2022-05-05 ENCOUNTER — Encounter: Payer: Self-pay | Admitting: Family Medicine

## 2022-05-07 ENCOUNTER — Other Ambulatory Visit: Payer: Self-pay | Admitting: Family Medicine

## 2022-05-07 DIAGNOSIS — Z1231 Encounter for screening mammogram for malignant neoplasm of breast: Secondary | ICD-10-CM

## 2022-05-11 DIAGNOSIS — H60502 Unspecified acute noninfective otitis externa, left ear: Secondary | ICD-10-CM | POA: Diagnosis not present

## 2022-05-11 DIAGNOSIS — Z9622 Myringotomy tube(s) status: Secondary | ICD-10-CM | POA: Diagnosis not present

## 2022-05-12 DIAGNOSIS — E119 Type 2 diabetes mellitus without complications: Secondary | ICD-10-CM | POA: Diagnosis not present

## 2022-05-12 DIAGNOSIS — R220 Localized swelling, mass and lump, head: Secondary | ICD-10-CM | POA: Diagnosis not present

## 2022-05-12 DIAGNOSIS — H61892 Other specified disorders of left external ear: Secondary | ICD-10-CM | POA: Diagnosis not present

## 2022-05-12 DIAGNOSIS — H6092 Unspecified otitis externa, left ear: Secondary | ICD-10-CM | POA: Diagnosis not present

## 2022-05-12 DIAGNOSIS — I10 Essential (primary) hypertension: Secondary | ICD-10-CM | POA: Diagnosis not present

## 2022-05-12 DIAGNOSIS — Z7984 Long term (current) use of oral hypoglycemic drugs: Secondary | ICD-10-CM | POA: Diagnosis not present

## 2022-05-12 DIAGNOSIS — Z8673 Personal history of transient ischemic attack (TIA), and cerebral infarction without residual deficits: Secondary | ICD-10-CM | POA: Diagnosis not present

## 2022-05-12 DIAGNOSIS — H9202 Otalgia, left ear: Secondary | ICD-10-CM | POA: Diagnosis not present

## 2022-05-12 DIAGNOSIS — E785 Hyperlipidemia, unspecified: Secondary | ICD-10-CM | POA: Diagnosis not present

## 2022-05-12 DIAGNOSIS — Z880 Allergy status to penicillin: Secondary | ICD-10-CM | POA: Diagnosis not present

## 2022-05-12 DIAGNOSIS — Z7902 Long term (current) use of antithrombotics/antiplatelets: Secondary | ICD-10-CM | POA: Diagnosis not present

## 2022-05-12 DIAGNOSIS — Z79899 Other long term (current) drug therapy: Secondary | ICD-10-CM | POA: Diagnosis not present

## 2022-05-15 ENCOUNTER — Ambulatory Visit (INDEPENDENT_AMBULATORY_CARE_PROVIDER_SITE_OTHER): Payer: Medicare HMO | Admitting: Pharmacist

## 2022-05-15 ENCOUNTER — Encounter: Payer: Self-pay | Admitting: Nurse Practitioner

## 2022-05-15 ENCOUNTER — Ambulatory Visit (INDEPENDENT_AMBULATORY_CARE_PROVIDER_SITE_OTHER): Payer: Medicare HMO | Admitting: Nurse Practitioner

## 2022-05-15 VITALS — BP 144/81 | HR 71 | Ht 62.0 in | Wt 180.0 lb

## 2022-05-15 DIAGNOSIS — I700 Atherosclerosis of aorta: Secondary | ICD-10-CM

## 2022-05-15 DIAGNOSIS — E1165 Type 2 diabetes mellitus with hyperglycemia: Secondary | ICD-10-CM

## 2022-05-15 DIAGNOSIS — H9202 Otalgia, left ear: Secondary | ICD-10-CM

## 2022-05-15 MED ORDER — TRAMADOL HCL 50 MG PO TABS: 50.0000 mg | ORAL_TABLET | Freq: Three times a day (TID) | ORAL | 0 refills | Status: AC | PRN

## 2022-05-15 NOTE — Patient Instructions (Signed)
Otitis Media, Adult  Otitis media is a condition in which the middle ear is red and swollen (inflamed) and full of fluid. The middle ear is the part of the ear that contains bones for hearing as well as air that helps send sounds to the brain. The condition usually goes away on its own. What are the causes? This condition is caused by a blockage in the eustachian tube. This tube connects the middle ear to the back of the nose. It normally allows air into the middle ear. The blockage is caused by fluid or swelling. Problems that can cause blockage include: A cold or infection that affects the nose, mouth, or throat. Allergies. An irritant, such as tobacco smoke. Adenoids that have become large. The adenoids are soft tissue located in the back of the throat, behind the nose and the roof of the mouth. Growth or swelling in the upper part of the throat, just behind the nose (nasopharynx). Damage to the ear caused by a change in pressure. This is called barotrauma. What increases the risk? You are more likely to develop this condition if you: Smoke or are exposed to tobacco smoke. Have an opening in the roof of your mouth (cleft palate). Have acid reflux. Have problems in your body's defense system (immune system). What are the signs or symptoms? Symptoms of this condition include: Ear pain. Fever. Problems with hearing. Being tired. Fluid leaking from the ear. Ringing in the ear. How is this treated? This condition can go away on its own within 3-5 days. But if the condition is caused by germs (bacteria) and does not go away on its own, or if it keeps coming back, your doctor may: Give you antibiotic medicines. Give you medicines for pain. Follow these instructions at home: Take over-the-counter and prescription medicines only as told by your doctor. If you were prescribed an antibiotic medicine, take it as told by your doctor. Do not stop taking it even if you start to feel better. Keep  all follow-up visits. Contact a doctor if: You have bleeding from your nose. There is a lump on your neck. You are not feeling better in 5 days. You feel worse instead of better. Get help right away if: You have pain that is not helped with medicine. You have swelling, redness, or pain around your ear. You get a stiff neck. You cannot move part of your face (paralysis). You notice that the bone behind your ear hurts when you touch it. You get a very bad headache. Summary Otitis media means that the middle ear is red, swollen, and full of fluid. This condition usually goes away on its own. If the problem does not go away, treatment may be needed. You may be given medicines to treat the infection or to treat your pain. If you were prescribed an antibiotic medicine, take it as told by your doctor. Do not stop taking it even if you start to feel better. Keep all follow-up visits. This information is not intended to replace advice given to you by your health care provider. Make sure you discuss any questions you have with your health care provider. Document Revised: 02/20/2021 Document Reviewed: 02/20/2021 Elsevier Patient Education  2023 [employer]  

## 2022-05-15 NOTE — Progress Notes (Signed)
Acute Office Visit  Subjective:     Patient ID: Yvonne [name], female    DOB: [birth year], 71 y.o.   MRN: [id number]  Chief Complaint  Patient presents with   Ear Pain    This has been going on for a week , left ear - drainage - blood , puss     Otalgia  There is pain in both ears. This is a new problem. The current episode started in the past 7 days. The problem occurs constantly. The problem has been unchanged. There has been no fever. The pain is severe. Associated symptoms include ear discharge. Pertinent negatives include no abdominal pain, diarrhea, headaches, hearing loss, rash, rhinorrhea or sore throat. Treatments tried: Narcotics. The treatment provided no relief. Her past medical history is significant for a tympanostomy tube.     Review of Systems  Constitutional: Negative.   HENT:  Positive for ear discharge and ear pain. Negative for congestion, hearing loss, rhinorrhea, sinus pain, sore throat and tinnitus.   Eyes: Negative.   Respiratory: Negative.    Cardiovascular: Negative.   Gastrointestinal:  Negative for abdominal pain and diarrhea.  Skin: Negative.  Negative for itching and rash.  Neurological:  Negative for headaches.  All other systems reviewed and are negative.       Objective:    BP (!) 144/81   Pulse 71   Ht '5\' 2"'$  (1.575 m)   Wt 180 lb (81.6 kg)   SpO2 96%   BMI 32.92 kg/m  BP Readings from Last 3 Encounters:  05/15/22 (!) 144/81  05/03/22 111/65  04/10/22 118/67      Physical Exam Vitals and nursing note reviewed.  Constitutional:      Appearance: Normal appearance.  HENT:     Head: Normocephalic.     Right Ear: Hearing and external ear normal. No swelling or tenderness. There is no impacted cerumen. No foreign body.     Left Ear: Hearing and external ear normal. No decreased hearing noted. Swelling and tenderness present. No drainage. There is no impacted cerumen. No foreign body.     Nose: Nose normal.     Mouth/Throat:      Mouth: Mucous membranes are moist.     Pharynx: Oropharynx is clear.  Eyes:     Conjunctiva/sclera: Conjunctivae normal.  Cardiovascular:     Rate and Rhythm: Normal rate and regular rhythm.     Pulses: Normal pulses.     Heart sounds: Normal heart sounds.  Pulmonary:     Effort: Pulmonary effort is normal.     Breath sounds: Normal breath sounds.  Abdominal:     General: Bowel sounds are normal.  Musculoskeletal:        General: Normal range of motion.  Skin:    General: Skin is warm.  Neurological:     General: No focal deficit present.     Mental Status: She is alert and oriented to person, place, and time.  Psychiatric:        Mood and Affect: Mood normal.        Behavior: Behavior normal.     No results found for any visits on 05/15/22.      Assessment & Plan:  Ear pain not well controlled, patient was treated in the ED with Morphine, and Oxycodone for pain. Patient reports bloody drainage from left ear, no bloody drainage assessed on this visit. Currently on antibiotics, patient is unable to take any steroids. ENT appointment scheduled for Thursday. Patient  is reassessed and pain evaluated and rated as 20 /10. I gave patient 3 days Tramadol to last her till ENT appointment. Patient knows to follow up with worsening unresolved symptoms.   Problem List Items Addressed This Visit   None Visit Diagnoses     Ear pain, left    -  Primary   Relevant Medications   traMADol (ULTRAM) 50 MG tablet       Meds ordered this encounter  Medications   traMADol (ULTRAM) 50 MG tablet    Sig: Take 1 tablet (50 mg total) by mouth every 8 (eight) hours as needed for up to 3 days.    Dispense:  9 tablet    Refill:  0    Order Specific Question:   Supervising Provider    Answer:   [name] [phone]    Return if symptoms worsen or fail to improve.  Ivy Lynn, NP

## 2022-05-16 NOTE — Progress Notes (Deleted)
NEUROLOGY CONSULTATION NOTE  Yvonne [name] MRN: [id number] DOB: June 22, 1951  Referring provider: [name], MD Primary care provider: [name], MD  Reason for consult:  history of strokes  Assessment/Plan:   ***   Subjective:  Yvonne [name] is a 71 year old  right-handed woman with hypertension, hyperlipidemia, anxiety and diabetes who presents to re-establish care regarding recurrent stroke-like episodes  Previously seen by me in 2018 and 2019.  Refer to my prior notes from 06/12/2018 for further details.  History of recurrent stroke-like events since 2017 presenting with right sided numbness and weakness with negative brain MRIs and no large vessel occlusion or hemodynamically significant intracranial or extracranial arterial stenosis.  Cardiac workup, including echo and telemetry, have been negative.  However she does have history of uncontrolled diabetes, blood pressure and hyperlipidemia.  .  At first, she was diagnosed with MRI-negative stroke.  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  She has had several EEGs which have been normal.    As noted above, she has history of migraines ***  Current secondary stroke management:  ASA 8m, Plavix 751m atorvastatin 8034mamlodipine, furoseminde, glipizide. Current antidepressant:  Lexapro 28m58mily  Past medications:  metoprolol, topiramate, venlafaxine, pregablin  Prior testing and Imaging personally reviewed: 09/28/2016 MRI BRAIN:  Negative MRI head for age. 09/28/2016 MRA HEAD:  Negative MRA head. 09/29/2016 CAROTID US: [place] 03/23/2017 MRI BRAIN:  Normal MRI brain for age 45/26/2018 CTA HEAD & :NECK:  Mild atherosclerosis.  No emergent large vessel occlusion or severe stenosis. 05/16/2017 MRI BRAIN WO:  No acute intracranial process.  Stable examination: Old small LEFT thalamus lacunar infarct  and minimal chronic small vessel ischemic disease. 06/06/2018 MRI BRAIN:  No acute intracranial abnormality identified. Stable small chronic lacunar infarct in left thalamus. 06/06/2018 MRA HEAD:  Patent anterior and posterior intracranial circulation. No large vessel occlusion, aneurysm, or significant stenosis. vessel occlusion, aneurysm, or significant stenosis. 12/12/2018 MRI BRAIN WO:  No acute brain finding. Brain appears normal except for some old small vessel infarctions in the left thalamus. 12/12/2018 MRA HEAD:  Intracranial MR angiography does not show any anterior circulation abnormality. There is worsening atherosclerotic narrowing and irregularity of the posterior cerebral arteries, left worse than right. There is now a 50-70% stenosis in the left P1 segment. 06/10/2019 MRI BRAIN WO:  1. No acute intracranial abnormality to explain left-sided weakness. 2. Stable remote lacunar infarcts of the left thalamus and inferior right cerebellum. 3. Posterior left corona radiata lacunar infarct is new since January 2020, but not acute. 08/22/2021 MRI BRAIN WO:  No evidence of acute intracranial abnormality. Stable non-contrast MRI appearance of the brain as compared to 06/10/2019. Redemonstrated chronic lacunar infarcts within the left corona radiata, left thalamus and right cerebellar hemisphere. 2 mm chronic microhemorrhage within the left temporoparietal junction. 01/23/2022 CT HEAD:  No acute intracranial abnormality.  Stable chronic lacunar infarcts are again noted in the left corona radiata, left thalamus, right external capsule, and right cerebellum. 01/16/2022 MRI BRAIN WO:  Negative for acute infarct.  Mild chronic ischemic change with small chronic lacunar infarction in the white matter, the left thalamus and right external capsule and  right inferior cerebellum. Chronic microhemorrhage left temporoparietal lobe 01/16/2022 CTA HEAD & NECK:  1. Negative for intracranial large vessel  occlusion or flow limiting stenosis. 2. Mild stenosis right P2 segment. Mild atherosclerotic stenosis in the  cavernous carotid bilaterally 3. No significant carotid or vertebral artery stenosis in the neck. Mild atherosclerotic disease of the carotid bifurcation bilaterally.  PAST MEDICAL HISTORY: Past Medical History:  Diagnosis Date   Anxiety    Arthritis    left hand   Asthma    daily and prn inhalers   Chronic cough    Chronic otitis media 12/2017   Full dentures    GERD (gastroesophageal reflux disease)    History of stroke 09/2017   weakness right hand, numbness right side face   Hyperlipidemia    Hypertension    states under control with meds., has been on med. x a long time, per pt.   Non-insulin dependent type 2 diabetes mellitus ([place])    Overactive bladder    Recurrent acute suppurative otitis media without spontaneous rupture of left tympanic membrane 02/19/2018   Stroke ([place])    Transient cerebral ischemia     PAST SURGICAL HISTORY: Past Surgical History:  Procedure Laterality Date   ABDOMINAL HYSTERECTOMY     complete   CATARACT EXTRACTION W/ INTRAOCULAR LENS IMPLANT Right    CHOLECYSTECTOMY     COLONOSCOPY N/A 08/17/2015   Procedure: COLONOSCOPY;  Surgeon: Rogene Houston, MD;  [facility]: [facility];  Service: Endoscopy;  Laterality: N/A;  200 - moved to 7:30 - Ann notified pt   GAS INSERTION Right    x 2 - eye   LACRIMAL DUCT EXPLORATION Bilateral    removal of tear ducts   MYRINGOTOMY WITH TUBE PLACEMENT Bilateral 01/28/2018   Procedure: BILATERAL MYRINGOTOMY WITH TUBE PLACEMENT;  Surgeon: Leta Baptist, MD;  Location: [facility];  Service: ENT;  Laterality: Bilateral;   VITRECTOMY Left 09/14/2015    MEDICATIONS: Current Outpatient Medications on File Prior to Visit  Medication Sig Dispense Refill   ACCU-CHEK GUIDE test strip TEST BLOOD SUGAR 4 TIMES DAILY DX E11.69 400 strip 3   Accu-Chek Softclix Lancets lancets Test BS up to 4 times daily Dx  E11.69 400 each 3   albuterol (VENTOLIN HFA) 108 (90 Base) MCG/ACT inhaler TAKE 2 PUFFS BY MOUTH EVERY 6 HOURS AS NEEDED FOR WHEEZE OR SHORTNESS OF BREATH 54 each 0   alendronate (FOSAMAX) 70 MG tablet TAKE 1 TABLET EVERY 7 DAYS WITH A FULL GLASS OF WATER ON AN EMPTY STOMACH 12 tablet 1   ALPRAZolam (XANAX) 1 MG tablet ! Each morning, one each evening, and 1/2 each afternoon 75 tablet 5   amLODipine (NORVASC) 10 MG tablet TAKE 1 TABLET BY MOUTH EVERY DAY 90 tablet 3   aspirin 81 MG chewable tablet Chew 1 tablet (81 mg total) by mouth daily. X 90 days 90 tablet 0   atorvastatin (LIPITOR) 80 MG tablet Take 1 tablet (80 mg total) by mouth daily. 90 tablet 2   Bempedoic Acid-Ezetimibe (NEXLIZET) 180-10 MG TABS Take 180 mg by mouth daily. 30 tablet 6   Blood Glucose Monitoring Suppl (ACCU-CHEK GUIDE) w/Device KIT Test Bs QID Dx E11.69 1 kit 0   cefUROXime (CEFTIN) 500 MG tablet Take by mouth.     ciprofloxacin-dexamethasone (CIPRODEX) OTIC suspension Administer 4 drops into the left ear Two (2) times a day.     clopidogrel (PLAVIX) 75 MG tablet TAKE 1 TABLET BY MOUTH EVERY DAY WITH BREAKFAST 90 tablet 3   dapagliflozin propanediol (FARXIGA) 10 MG TABS tablet Take 1 tablet (10 mg total) by mouth daily. 90 tablet 4   diclofenac Sodium (VOLTAREN) 1 % GEL Apply 4 g topically 4 (  four) times daily. 50 g 1   escitalopram (LEXAPRO) 10 MG tablet Take 1 tablet (10 mg total) by mouth daily. 90 tablet 3   famotidine (PEPCID) 20 MG tablet Take 20 mg by mouth at bedtime.     Fexofenadine HCl (ALLEGRA PO) Take 1 tablet by mouth daily.     furosemide (LASIX) 20 MG tablet Take 1 tablet (20 mg total) by mouth daily. 30 tablet 3   glipiZIDE (GLUCOTROL) 5 MG tablet Take 1 tablet (5 mg total) by mouth daily before breakfast. 90 tablet 3   loratadine (CLARITIN) 10 MG tablet Take 1 tablet (10 mg total) by mouth daily. 30 tablet 11   nitroGLYCERIN (NITROSTAT) 0.4 MG SL tablet Place 1 tablet (0.4 mg total) under the tongue  every 5 (five) minutes as needed for chest pain. 25 tablet 10   oxybutynin (DITROPAN-XL) 5 MG 24 hr tablet Take 5 mg by mouth at bedtime.     Semaglutide, 2 MG/DOSE, (OZEMPIC, 2 MG/DOSE,) 8 MG/3ML SOPN Inject 2 mg into the skin once a week. 9 mL 3   traMADol (ULTRAM) 50 MG tablet Take 1 tablet (50 mg total) by mouth every 8 (eight) hours as needed for up to 3 days. 9 tablet 0   triamcinolone ointment (KENALOG) 0.5 % Apply 1 application. topically 2 (two) times daily. 30 g 0   vitamin B-12 (CYANOCOBALAMIN) 500 MCG tablet Take 1 tablet (500 mcg total) by mouth daily.     No current facility-administered medications on file prior to visit.    ALLERGIES: Allergies  Allergen Reactions   Penicillins Hives and Itching    Has patient had a PCN reaction causing immediate rash, facial/tongue/throat swelling, SOB or lightheadedness with hypotension: Yes Has patient had a PCN reaction causing severe rash involving mucus membranes or skin necrosis: Unk Has patient had a PCN reaction that required hospitalization: No Has patient had a PCN reaction occurring within the last 10 years: No If all of the above answers are "NO", then may proceed with Cephalosporin use.  Has patient had a PCN reaction causing immediate rash, facial/tongue/throat swelling, SOB or lightheadedness with hypotension: Yes Has patient had a PCN reaction causing severe rash involving mucus membranes or skin necrosis: No Has patient had a PCN reaction that required hospitalization No Has patient had a PCN reaction occurring within the last 10 years: No If all of the above answers are "NO", then may proceed with Cephalosporin use. Has patient had a PCN reaction causing immediate rash, facial/tongue/throat swelling, SOB or lightheadedness with hypotension: Yes Has patient had a PCN reaction causing severe rash involving mucus membranes or skin necrosis: Unk Has patient had a PCN reaction that required hospitalization: No Has patient had  a PCN reaction occurring within the last 10 years: No If all of the above answers are "NO", then may proceed with Cephalosporin use. Has patient had a PCN reaction causing immediate rash, facial/tongue/throat swelling, SOB or lightheadedness with hypotension: Yes Has patient had a PCN reaction causing severe rash involving mucus membranes or skin necrosis: No Has patient had a PCN reaction that required hospitalization No Has patient had a PCN reaction occurring within the last 10 years: No If all of the above answers are "NO", then may proceed with Cephalosporin use. Has patient had a PCN reaction causing immediate rash, facial/tongue/throat swelling, SOB or lightheadedness with hypotension: Yes Has patient had a PCN reaction causing sev... (TRUNCATED)   Gabapentin Other (See Comments)    Causes a lot of  lethargy at a higher dose Causes a lot of lethargy at a higher dose Causes a lot of lethargy at a higher dose   Lisinopril Cough    HAS A CHRONIC COUGH AND DID NOT NOTICE IMPROVEMENT OF COUGH WHEN LISINOPRIL WAS STOPPED HAS A CHRONIC COUGH AND DID NOT NOTICE IMPROVEMENT OF COUGH WHEN LISINOPRIL WAS STOPPED HAS A CHRONIC COUGH AND DID NOT NOTICE IMPROVEMENT OF COUGH WHEN LISINOPRIL WAS STOPPED   Soap Rash    DIAL soap    FAMILY HISTORY: Family History  Problem Relation Age of Onset   Arthritis Mother    Diabetes Mother    CVA Mother    Stroke Mother    Diabetes Father    Heart disease Father        CABG.  Does not know age of onset   Asthma Father    Hepatitis C Sister    Arthritis Brother    Cancer Brother        metastic cancer   Peripheral Artery Disease Daughter    Arthritis-Osteo Son     Objective:  *** General: No acute distress.  Patient appears well-groomed.   Head:  Normocephalic/atraumatic Eyes:  fundi examined but not visualized Neck: supple, no paraspinal tenderness, full range of motion Back: No paraspinal tenderness Heart: regular rate and rhythm Lungs:  Clear to auscultation bilaterally. Vascular: No carotid bruits. Neurological Exam: Mental status: alert and oriented to person, place, and time, recent and remote memory intact, fund of knowledge intact, attention and concentration intact, speech fluent and not dysarthric, language intact. Cranial nerves: CN I: not tested CN II: pupils equal, round and reactive to light, visual fields intact CN III, IV, VI:  full range of motion, no nystagmus, no ptosis CN V: facial sensation intact. CN VII: upper and lower face symmetric CN VIII: hearing intact CN IX, X: gag intact, uvula midline CN XI: sternocleidomastoid and trapezius muscles intact CN XII: tongue midline Bulk & Tone: normal, no fasciculations. Motor:  muscle strength 5/5 throughout Sensation:  Pinprick, temperature and vibratory sensation intact. Deep Tendon Reflexes:  2+ throughout,  toes downgoing.   Finger to nose testing:  Without dysmetria.   Heel to shin:  Without dysmetria.   Gait:  Normal station and stride.  Romberg negative.    Thank you for allowing me to take part in the care of this patient.  Metta Clines, DO  CC: ***

## 2022-05-17 ENCOUNTER — Ambulatory Visit: Payer: Medicare HMO | Admitting: Neurology

## 2022-05-17 ENCOUNTER — Encounter: Payer: Self-pay | Admitting: Neurology

## 2022-05-17 DIAGNOSIS — H7202 Central perforation of tympanic membrane, left ear: Secondary | ICD-10-CM | POA: Diagnosis not present

## 2022-05-17 DIAGNOSIS — H6982 Other specified disorders of Eustachian tube, left ear: Secondary | ICD-10-CM | POA: Diagnosis not present

## 2022-05-17 DIAGNOSIS — H6122 Impacted cerumen, left ear: Secondary | ICD-10-CM | POA: Diagnosis not present

## 2022-05-17 DIAGNOSIS — H60332 Swimmer's ear, left ear: Secondary | ICD-10-CM | POA: Diagnosis not present

## 2022-05-17 DIAGNOSIS — Z029 Encounter for administrative examinations, unspecified: Secondary | ICD-10-CM

## 2022-05-23 ENCOUNTER — Encounter: Payer: Self-pay | Admitting: *Deleted

## 2022-05-23 ENCOUNTER — Telehealth: Payer: Self-pay | Admitting: Family Medicine

## 2022-05-23 NOTE — Patient Instructions (Addendum)
Visit Information  Following are the goals we discussed today:  Current Barriers:  Unable to independently afford treatment regimen Unable to maintain control of T2DM, HLD Suboptimal therapeutic regimen for T2DM, HLD  Pharmacist Clinical Goal(s):  patient will verbalize ability to afford treatment regimen maintain control of T2DM, HLD as evidenced by GOAL A1C<7%, LDL<70  adhere to plan to optimize therapeutic regimen for T2DM, HLD as evidenced by report of adherence to recommended medication management changes through collaboration with [name] and provider.   Interventions: 1:1 collaboration with [name], MD regarding development and update of comprehensive plan of care as evidenced by provider attestation and co-signature Inter-disciplinary care team collaboration (see longitudinal plan of care) Comprehensive medication review performed; medication list updated in electronic medical record  Diabetes: Goal on track: NO. Uncontrolled-7.8-->8.1%%; current treatment: OZEMPIC '2MG'$ , FARXIGA '10MG'$ ; GLIPIZIDE  PATIENT HAS BEEN without her ozempic due to patient assistance program issues WILL resubmit for novo nordisk/ozempic patient assistance (samples given today) Denies personal and family history of Medullary thyroid cancer (MTC) Enrolled/approved for farxiga via az[employer]me patient assistance program Escribe to [employer] in EPIC Patient received farxiga in the mail GFR 44-->78 WORK TO D/C GLIPIZIDE Current glucose readings: fasting glucose: <180, post prandial glucose: 200s Denies hypoglycemic/hyperglycemic symptoms Discussed meal planning options and Plate method for healthy eating Avoid sugary drinks and desserts Incorporate balanced protein, non starchy veggies, 1 serving of carbohydrate with each meal Increase water intake Increase physical activity as able Current exercise: N/A; ENCOURAGED Recommended RESTART MEDICATIONS; WILL WORK UP TO [name] '2MG'$  DOSE Assessed patient  finances. Patient approved for NOVO [place] RESPECTIVELY--continue to follow  Hyperlipidemia:  Goal on Track (progressing): YES. controlled; current treatment:ZETIA, ATORVASTATIN '80MG'$ ;  Medications previously tried: N/A Current dietary patterns: RECOMMENDED HEART HEALTHY Lipid Panel     Component Value Date/Time   CHOL 106 05/03/2022 0827   TRIG 87 05/03/2022 0827   HDL 53 05/03/2022 0827   CHOLHDL 2.0 05/03/2022 0827   CHOLHDL 3.3 01/17/2022 0444   VLDL 11 01/17/2022 0444   LDLCALC 36 05/03/2022 0827   LABVLDL 17 05/03/2022 0827    Patient Goals/Self-Care Activities patient will:  - take medications as prescribed as evidenced by patient report and record review check glucose DAILY, document, and provide at future appointments collaborate with provider on medication access solutions target a minimum of 150 minutes of moderate intensity exercise weekly engage in dietary modifications by HEALTHY PLATE METHOD, HEART HEALTHY   Plan: Telephone follow up appointment with care management team member scheduled for:  3-6 months  Signature Regina Eck, [name], BCPS Clinical Pharmacist, [place]  II Phone [phone]   Please call the care guide team at [phone] if you need to cancel or reschedule your appointment.   Patient verbalizes understanding of instructions and care plan provided today and agrees to view in [place]. Active MyChart status and patient understanding of how to access instructions and care plan via MyChart confirmed with patient.

## 2022-05-23 NOTE — Progress Notes (Signed)
Chronic Care Management Pharmacy Note  05/15/2022 Name:  Yvonne [name] MRN:  [id number] DOB:  [birth year]  Summary:  Diabetes: Goal on track: NO. Uncontrolled-7.8-->8.1%%; current treatment: OZEMPIC 2MG, FARXIGA 10MG; GLIPIZIDE  PATIENT HAS BEEN without her ozempic due to patient assistance program issues WILL resubmit for novo nordisk/ozempic patient assistance (samples given today) Denies personal and family history of Medullary thyroid cancer (MTC) Enrolled/approved for farxiga via az&me patient assistance program Escribe to [employer] in EPIC Patient received farxiga in the mail GFR 44-->78 WORK TO D/C GLIPIZIDE Current glucose readings: fasting glucose: <180, post prandial glucose: 200s Denies hypoglycemic/hyperglycemic symptoms Discussed meal planning options and Plate method for healthy eating Avoid sugary drinks and desserts Incorporate balanced protein, non starchy veggies, 1 serving of carbohydrate with each meal Increase water intake Increase physical activity as able Current exercise: N/A; ENCOURAGED Recommended RESTART MEDICATIONS; WILL WORK UP TO OZEMPIC 2MG DOSE Assessed patient finances. Patient approved for NOVO [place] RESPECTIVELY--continue to follow  Hyperlipidemia:  Goal on Track (progressing): YES. controlled; current treatment:ZETIA, ATORVASTATIN 80MG;  Medications previously tried: N/A Current dietary patterns: RECOMMENDED HEART HEALTHY Lipid Panel     Component Value Date/Time   CHOL 106 05/03/2022 0827   TRIG 87 05/03/2022 0827   HDL 53 05/03/2022 0827   CHOLHDL 2.0 05/03/2022 0827   CHOLHDL 3.3 01/17/2022 0444   VLDL 11 01/17/2022 0444   LDLCALC 36 05/03/2022 0827   LABVLDL 17 05/03/2022 0827    Patient Goals/Self-Care Activities patient will:  - take medications as prescribed as evidenced by patient report and record review check glucose DAILY, document, and provide at future appointments collaborate with  provider on medication access solutions target a minimum of 150 minutes of moderate intensity exercise weekly engage in dietary modifications by HEALTHY PLATE METHOD, HEART HEALTHY   Subjective: Yvonne [name] is an 71 y.o. year old female who is a primary patient of [name], [name], MD.  The CCM team was consulted for assistance with disease management and care coordination needs.    Engaged with patient face to face for follow up visit in response to provider referral for pharmacy case management and/or care coordination services.   Consent to Services:  The patient was given information about Chronic Care Management services, agreed to services, and gave verbal consent prior to initiation of services.  Please see initial visit note for detailed documentation.   Patient Care Team: [name], MD as PCP - General (Family Medicine) Pieter Partridge, DO as Consulting Physician (Neurology) Shea Evans [name], [name] as Social Worker (Licensed Clinical Social Worker) Ilean China, RN as Case Manager Heberto Sturdevant, Royce Macadamia, [facility] (Pharmacist) Werner Lean, MD as Consulting Physician (Cardiology) Harlen Labs, MD as Referring Physician (Optometry)  Objective:  Lab Results  Component Value Date   CREATININE 0.85 05/03/2022   CREATININE 1.04 (H) 03/01/2022   CREATININE 1.01 (H) 01/23/2022    Lab Results  Component Value Date   HGBA1C 8.1 (H) 05/03/2022      Component Value Date/Time   CHOL 106 05/03/2022 0827   TRIG 87 05/03/2022 0827   HDL 53 05/03/2022 0827   CHOLHDL 2.0 05/03/2022 0827   CHOLHDL 3.3 01/17/2022 0444   VLDL 11 01/17/2022 0444   LDLCALC 36 05/03/2022 0827       Latest Ref Rng & Units 05/03/2022    8:27 AM 01/23/2022    3:24 PM 01/17/2022    4:44 AM  Hepatic  Function  Total Protein 6.0 - 8.5 g/dL 6.4  7.4  6.9   Albumin 3.7 - 4.7 g/dL 4.1  4.1  3.6   AST 0 - 40 IU/L _0 ALT 0 - 32 IU/L 37  44  29   Alk Phosphatase 44 - 121 IU/L 91  121  107    Total Bilirubin 0.0 - 1.2 mg/dL 0.4  0.6  0.6     Lab Results  Component Value Date/Time   TSH 0.766 01/17/2022 04:44 AM   TSH 3.040 06/20/2018 10:11 AM   TSH 1.580 06/03/2015 12:10 PM   FREET4 0.83 01/17/2022 04:44 AM       Latest Ref Rng & Units 05/03/2022    8:27 AM 01/23/2022    3:24 PM 01/16/2022    5:27 PM  CBC  WBC 3.4 - 10.8 x10E3/uL 5.7  7.1    Hemoglobin 11.1 - 15.9 g/dL 13.2  13.7  14.3   Hematocrit 34.0 - 46.6 % 39.4  40.4  42.0   Platelets 150 - 450 x10E3/uL 244  235      Lab Results  Component Value Date/Time   VD25OH 14.9 (L) 05/03/2016 08:07 AM   VD25OH 20.0 (L) 06/03/2015 12:10 PM    Clinical ASCVD: Yes  The ASCVD Risk score (Arnett DK, et al., 2019) failed to calculate for the following reasons:   The patient has a prior MI or stroke diagnosis    Other: (CHADS2VASc if Afib, PHQ9 if depression, MMRC or CAT for COPD, ACT, DEXA)  Social History   Tobacco Use  Smoking Status Never  Smokeless Tobacco Never   BP Readings from Last 3 Encounters:  05/15/22 (!) 144/81  05/03/22 111/65  04/10/22 118/67   Pulse Readings from Last 3 Encounters:  05/15/22 71  05/03/22 70  04/10/22 72   Wt Readings from Last 3 Encounters:  05/15/22 180 lb (81.6 kg)  05/03/22 179 lb 6.4 oz (81.4 kg)  04/10/22 177 lb 9.6 oz (80.6 kg)    Assessment: Review of patient past medical history, allergies, medications, health status, including review of consultants reports, laboratory and other test data, was performed as part of comprehensive evaluation and provision of chronic care management services.   SDOH:  (Social Determinants of Health) assessments and interventions performed:    CCM Care Plan  Allergies  Allergen Reactions   Penicillins Hives and Itching    Has patient had a PCN reaction causing immediate rash, facial/tongue/throat swelling, SOB or lightheadedness with hypotension: Yes Has patient had a PCN reaction causing severe rash involving mucus membranes or  skin necrosis: Unk Has patient had a PCN reaction that required hospitalization: No Has patient had a PCN reaction occurring within the last 10 years: No If all of the above answers are "NO", then may proceed with Cephalosporin use.  Has patient had a PCN reaction causing immediate rash, facial/tongue/throat swelling, SOB or lightheadedness with hypotension: Yes Has patient had a PCN reaction causing severe rash involving mucus membranes or skin necrosis: No Has patient had a PCN reaction that required hospitalization No Has patient had a PCN reaction occurring within the last 10 years: No If all of the above answers are "NO", then may proceed with Cephalosporin use. Has patient had a PCN reaction causing immediate rash, facial/tongue/throat swelling, SOB or lightheadedness with hypotension: Yes Has patient had a PCN reaction causing severe rash involving mucus membranes or skin necrosis: Unk Has patient had a PCN reaction that required  hospitalization: No Has patient had a PCN reaction occurring within the last 10 years: No If all of the above answers are "NO", then may proceed with Cephalosporin use. Has patient had a PCN reaction causing immediate rash, facial/tongue/throat swelling, SOB or lightheadedness with hypotension: Yes Has patient had a PCN reaction causing severe rash involving mucus membranes or skin necrosis: No Has patient had a PCN reaction that required hospitalization No Has patient had a PCN reaction occurring within the last 10 years: No If all of the above answers are "NO", then may proceed with Cephalosporin use. Has patient had a PCN reaction causing immediate rash, facial/tongue/throat swelling, SOB or lightheadedness with hypotension: Yes Has patient had a PCN reaction causing sev... (TRUNCATED)   Gabapentin Other (See Comments)    Causes a lot of lethargy at a higher dose Causes a lot of lethargy at a higher dose Causes a lot of lethargy at a higher dose    Lisinopril Cough    HAS A CHRONIC COUGH AND DID NOT NOTICE IMPROVEMENT OF COUGH WHEN LISINOPRIL WAS STOPPED HAS A CHRONIC COUGH AND DID NOT NOTICE IMPROVEMENT OF COUGH WHEN LISINOPRIL WAS STOPPED HAS A CHRONIC COUGH AND DID NOT NOTICE IMPROVEMENT OF COUGH WHEN LISINOPRIL WAS STOPPED   Soap Rash    DIAL soap    Medications Reviewed Today     Reviewed by Ivy Lynn, NP (Nurse Practitioner) on 05/15/22 at [phone]  Med List Status: <None>   Medication Order Taking? Sig Documenting Provider Last Dose Status Informant  ACCU-CHEK GUIDE test strip [id number] Yes TEST BLOOD SUGAR 4 TIMES DAILY DX E11.69 [name], MD Taking Active   Accu-Chek Softclix Lancets lancets [id number] Yes Test BS up to 4 times daily Dx E11.69 [name], MD Taking Active Self, Pharmacy Records  albuterol (VENTOLIN HFA) 108 (90 Base) MCG/ACT inhaler [id number] Yes TAKE 2 PUFFS BY MOUTH EVERY 6 HOURS AS NEEDED FOR WHEEZE OR SHORTNESS OF Belenda Cruise, MD Taking Active   alendronate (FOSAMAX) 70 MG tablet [id number] Yes TAKE 1 TABLET EVERY 7 DAYS WITH A FULL GLASS OF WATER ON AN EMPTY STOMACH [name], MD Taking Active   ALPRAZolam Duanne Moron) 1 MG tablet [id number] Yes ! Each morning, one each evening, and 1/2 each afternoon [name], MD Taking Active   amLODipine (NORVASC) 10 MG tablet [id number] Yes TAKE 1 TABLET BY MOUTH EVERY Effie Shy, MD Taking Active Self, Pharmacy Records  aspirin 81 MG chewable tablet [id number] Yes Chew 1 tablet (81 mg total) by mouth daily. X 90 days Tat, Shanon Brow, MD Taking Active   atorvastatin (LIPITOR) 80 MG tablet [id number] Yes Take 1 tablet (80 mg total) by mouth daily. Werner Lean, MD Taking Active Self, Pharmacy Records  Bempedoic Acid-Ezetimibe (NEXLIZET) 180-10 MG TABS [id number] Yes Take 180 mg by mouth daily. Werner Lean, MD Taking Active   Blood Glucose Monitoring Suppl (ACCU-CHEK GUIDE) w/Device Drucie Opitz [id number] Yes Test Bs QID Dx E11.69  [name], MD Taking Active Self, Pharmacy Records  cefUROXime (CEFTIN) 500 MG tablet [id number] Yes Take by mouth. [provider]  Active   ciprofloxacin-dexamethasone (CIPRODEX) OTIC suspension [id number] Yes Administer 4 drops into the left ear Two (2) times a day. [provider]  Active   clopidogrel (PLAVIX) 75 MG tablet [id number] Yes TAKE 1 TABLET BY MOUTH EVERY DAY WITH Gara Kroner, MD Taking Active Self, Pharmacy Records  dapagliflozin propanediol (FARXIGA) 10 MG TABS tablet [id number] Yes Take 1 tablet (10 mg total) by  mouth daily. [name], MD Taking Active Self, Pharmacy Records           Med Note Parthenia Ames Jan 11, 2022 11:28 AM) Via AZ&me patient assistance program   diclofenac Sodium (VOLTAREN) 1 % GEL [id number] Yes Apply 4 g topically 4 (four) times daily. Gwenlyn Perking, FNP Taking Active   escitalopram (LEXAPRO) 10 MG tablet [id number] Yes Take 1 tablet (10 mg total) by mouth daily. [name], MD Taking Active   famotidine (PEPCID) 20 MG tablet [id number] Yes Take 20 mg by mouth at bedtime. [provider] Taking Active Self, Pharmacy Records  Fexofenadine HCl (ALLEGRA PO) [id number] Yes Take 1 tablet by mouth daily. [provider] Taking Active Self, Pharmacy Records  furosemide (LASIX) 20 MG tablet [id number] Yes Take 1 tablet (20 mg total) by mouth daily. Baruch Gouty, FNP Taking Active   glipiZIDE (GLUCOTROL) 5 MG tablet [id number] Yes Take 1 tablet (5 mg total) by mouth daily before breakfast. [name], MD Taking Active Self, Pharmacy Records  loratadine (CLARITIN) 10 MG tablet [id number] Yes Take 1 tablet (10 mg total) by mouth daily. Hassell Done Mary-Margaret, FNP Taking Active   nitroGLYCERIN (NITROSTAT) 0.4 MG SL tablet [id number] Yes Place 1 tablet (0.4 mg total) under the tongue every 5 (five) minutes as needed for chest pain. Werner Lean, MD Taking Active Self, Pharmacy Records   oxybutynin (DITROPAN-XL) 5 MG 24 hr tablet [id number] Yes Take 5 mg by mouth at bedtime. [provider] Taking Active   oxyCODONE (OXY IR/ROXICODONE) 5 MG immediate release tablet [id number] Yes Take by mouth. [provider]  Active   Semaglutide, 2 MG/DOSE, (OZEMPIC, 2 MG/DOSE,) 8 MG/3ML SOPN [id number] Yes Inject 2 mg into the skin once a week. [name], MD Taking Active Self, Pharmacy Records           Med Note Rutherford Nail Jan 16, 2022  8:40 PM) Due tonight 01/16/22 Dr office gave pt a sample  triamcinolone ointment (KENALOG) 0.5 % [id number] Yes Apply 1 application. topically 2 (two) times daily. Gwenlyn Perking, FNP Taking Active   vitamin B-12 (CYANOCOBALAMIN) 500 MCG tablet [id number] Yes Take 1 tablet (500 mcg total) by mouth daily. Orson Eva, MD Taking Active             Patient Active Problem List   Diagnosis Date Noted   Depression, major, single episode, severe ([place]) 04/10/2022   Peripheral edema 04/10/2022   History of stroke 01/24/2022   Coronary artery disease involving native coronary artery of native heart without angina pectoris 01/24/2022   TIA (transient ischemic attack) 01/18/2022   Uncontrolled type 2 diabetes mellitus with hyperglycemia, without long-term current use of insulin ([place]) 01/17/2022   Sensory disturbance 01/17/2022   Syncope and collapse 01/16/2022   Ear pain, right 06/13/2021   Restless legs 02/27/2021   Insomnia 02/27/2021   Polyneuropathy due to type 2 diabetes mellitus ([place]) 02/27/2021   Palpitations 01/20/2021   Aortic atherosclerosis ([place]) 01/20/2021   Orthostatic hypotension 01/20/2021   Carpal tunnel syndrome 08/23/2020   Diabetic lipidosis ([place]) 09/09/2019   Hypokalemia 12/11/2018   Benzodiazepine dependence, continuous ([place]) 10/22/2018   Chronic migraine without aura without status migrainosus, not intractable 02/19/2018   Chronic diastolic CHF (congestive heart failure) ([place]) 03/21/2017   Chronic  bilateral low back pain with bilateral sciatica 02/27/2017   Spondylosis of lumbar region without myelopathy or radiculopathy 08/15/2016   Pain of both hip joints  06/18/2016   Pain syndrome, chronic 06/18/2016   Chronic cough 05/17/2016   HLD (hyperlipidemia) 11/04/2015   Macular hole of right eye 09/14/2015   Obesity 07/20/2015   Osteopenia 07/20/2015   Precordial pain 07/06/2015   Overactive bladder    Hypertension    GAD (generalized anxiety disorder)    Acid reflux disease 08/17/2014    Immunization History  Administered Date(s) Administered   Fluad Quad(high Dose 65+) 08/30/2021   Influenza Split 08/18/2015   Influenza,inj,Quad PF,6+ Mos 10/22/2018   Influenza-Unspecified 09/14/2013   Moderna Sars-Covid-2 Vaccination 06/14/2020, 07/12/2020   PNEUMOCOCCAL CONJUGATE-20 04/17/2022   Pneumococcal Conjugate-13 01/06/2015   Pneumococcal Polysaccharide-23 10/22/2018   Tdap 06/06/2017, 05/22/2021   Zoster Recombinat (Shingrix) 06/06/2017, 02/26/2018    Conditions to be addressed/monitored: HLD, DMII, 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  Care Plan : [name] MEDICATION MANAGEMENT  Updates made by Lavera Guise, RPH since 05/23/2022 12:00 AM     Problem: DISEASE PROGRESSION PREVENTION      Long-Range Goal: T2DM, HLD [name] GOAL   Recent Progress: Not on track  Note:   Current Barriers:  Unable to independently afford treatment regimen Unable to maintain control of T2DM, HLD Suboptimal therapeutic regimen for T2DM, HLD  Pharmacist Clinical Goal(s):  patient will verbalize ability to afford treatment regimen maintain control of T2DM, HLD as evidenced by GOAL A1C<7%, LDL<70  adhere to plan to optimize therapeutic regimen for T2DM, HLD as evidenced by report of adherence to recommended medication management changes through collaboration with [name] and provider.   Interventions: 1:1 collaboration with [name], MD regarding development and update of comprehensive plan of care as  evidenced by provider attestation and co-signature Inter-disciplinary care team collaboration (see longitudinal plan of care) Comprehensive medication review performed; medication list updated in electronic medical record  Diabetes: Goal on track: NO. Uncontrolled-7.8-->8.1%%; current treatment: OZEMPIC 2MG, FARXIGA 10MG; GLIPIZIDE  PATIENT HAS BEEN without her ozempic due to patient assistance program issues WILL resubmit for novo nordisk/ozempic patient assistance (samples given today) Denies personal and family history of Medullary thyroid cancer (MTC) Enrolled/approved for farxiga via az&me patient assistance program Escribe to [employer] in EPIC Patient received farxiga in the mail GFR 44-->78 WORK TO D/C GLIPIZIDE Current glucose readings: fasting glucose: <180, post prandial glucose: 200s Denies hypoglycemic/hyperglycemic symptoms Discussed meal planning options and Plate method for healthy eating Avoid sugary drinks and desserts Incorporate balanced protein, non starchy veggies, 1 serving of carbohydrate with each meal Increase water intake Increase physical activity as able Current exercise: N/A; ENCOURAGED Recommended RESTART MEDICATIONS; WILL WORK UP TO OZEMPIC 2MG DOSE Assessed patient finances. Patient approved for NOVO [place] RESPECTIVELY--continue to follow  Hyperlipidemia:  Goal on Track (progressing): YES. controlled; current treatment:ZETIA, ATORVASTATIN 80MG;  Medications previously tried: N/A Current dietary patterns: RECOMMENDED HEART HEALTHY Lipid Panel     Component Value Date/Time   CHOL 106 05/03/2022 0827   TRIG 87 05/03/2022 0827   HDL 53 05/03/2022 0827   CHOLHDL 2.0 05/03/2022 0827   CHOLHDL 3.3 01/17/2022 0444   VLDL 11 01/17/2022 0444   LDLCALC 36 05/03/2022 0827   LABVLDL 17 05/03/2022 0827   Patient Goals/Self-Care Activities patient will:  - take medications as prescribed as evidenced by patient report and  record review check glucose DAILY, document, and provide at future appointments collaborate with provider on medication access solutions target a minimum of 150 minutes of moderate intensity exercise weekly engage in dietary modifications by [place], HEART HEALTHY  Medication Assistance:  farxiga obtained through az&me medication assistance program.  Enrollment ends 12/31 App re-submitted for ozempic//novo nordisk PAP--pending   Follow Up:  Patient agrees to Care Plan and Follow-up.  Plan: Telephone follow up appointment with care management team member scheduled for:  07/2022   Regina Eck, [name], BCPS Clinical Pharmacist, [place]  [facility] Phone [phone]

## 2022-05-23 NOTE — Telephone Encounter (Signed)
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 She needs to have the physical before August 1st and Dr [name] first available at this time is not until August 9th. Would like to know if she can be worked in sooner or if she can see someone else. Please call back and advise.

## 2022-05-23 NOTE — Telephone Encounter (Signed)
Appointment scheduled.

## 2022-05-25 DIAGNOSIS — Z7984 Long term (current) use of oral hypoglycemic drugs: Secondary | ICD-10-CM | POA: Diagnosis not present

## 2022-05-25 DIAGNOSIS — Z7985 Long-term (current) use of injectable non-insulin antidiabetic drugs: Secondary | ICD-10-CM

## 2022-05-25 DIAGNOSIS — E1165 Type 2 diabetes mellitus with hyperglycemia: Secondary | ICD-10-CM

## 2022-05-25 DIAGNOSIS — E785 Hyperlipidemia, unspecified: Secondary | ICD-10-CM | POA: Diagnosis not present

## 2022-05-28 ENCOUNTER — Other Ambulatory Visit: Payer: Self-pay | Admitting: Family Medicine

## 2022-05-28 DIAGNOSIS — M7989 Other specified soft tissue disorders: Secondary | ICD-10-CM

## 2022-05-29 DIAGNOSIS — Z8679 Personal history of other diseases of the circulatory system: Secondary | ICD-10-CM | POA: Diagnosis not present

## 2022-05-29 DIAGNOSIS — I110 Hypertensive heart disease with heart failure: Secondary | ICD-10-CM | POA: Diagnosis not present

## 2022-05-29 DIAGNOSIS — I6523 Occlusion and stenosis of bilateral carotid arteries: Secondary | ICD-10-CM | POA: Diagnosis not present

## 2022-05-29 DIAGNOSIS — R079 Chest pain, unspecified: Secondary | ICD-10-CM | POA: Diagnosis not present

## 2022-05-29 DIAGNOSIS — E785 Hyperlipidemia, unspecified: Secondary | ICD-10-CM | POA: Diagnosis not present

## 2022-05-29 DIAGNOSIS — J323 Chronic sphenoidal sinusitis: Secondary | ICD-10-CM | POA: Diagnosis not present

## 2022-05-29 DIAGNOSIS — E119 Type 2 diabetes mellitus without complications: Secondary | ICD-10-CM | POA: Diagnosis not present

## 2022-05-29 DIAGNOSIS — Z8673 Personal history of transient ischemic attack (TIA), and cerebral infarction without residual deficits: Secondary | ICD-10-CM | POA: Diagnosis not present

## 2022-05-29 DIAGNOSIS — Z20822 Contact with and (suspected) exposure to covid-19: Secondary | ICD-10-CM | POA: Diagnosis not present

## 2022-05-29 DIAGNOSIS — I249 Acute ischemic heart disease, unspecified: Secondary | ICD-10-CM | POA: Diagnosis not present

## 2022-05-29 DIAGNOSIS — N179 Acute kidney failure, unspecified: Secondary | ICD-10-CM | POA: Diagnosis not present

## 2022-05-29 DIAGNOSIS — K219 Gastro-esophageal reflux disease without esophagitis: Secondary | ICD-10-CM | POA: Diagnosis not present

## 2022-05-29 DIAGNOSIS — R55 Syncope and collapse: Secondary | ICD-10-CM | POA: Diagnosis not present

## 2022-05-29 DIAGNOSIS — F419 Anxiety disorder, unspecified: Secondary | ICD-10-CM | POA: Diagnosis not present

## 2022-05-29 DIAGNOSIS — R072 Precordial pain: Secondary | ICD-10-CM | POA: Diagnosis not present

## 2022-05-29 DIAGNOSIS — Z7902 Long term (current) use of antithrombotics/antiplatelets: Secondary | ICD-10-CM | POA: Diagnosis not present

## 2022-05-29 DIAGNOSIS — J42 Unspecified chronic bronchitis: Secondary | ICD-10-CM | POA: Diagnosis not present

## 2022-05-29 DIAGNOSIS — Z794 Long term (current) use of insulin: Secondary | ICD-10-CM | POA: Diagnosis not present

## 2022-05-30 ENCOUNTER — Ambulatory Visit: Payer: Self-pay | Admitting: *Deleted

## 2022-05-30 DIAGNOSIS — E785 Hyperlipidemia, unspecified: Secondary | ICD-10-CM | POA: Diagnosis not present

## 2022-05-30 DIAGNOSIS — R55 Syncope and collapse: Secondary | ICD-10-CM | POA: Diagnosis not present

## 2022-05-30 DIAGNOSIS — R079 Chest pain, unspecified: Secondary | ICD-10-CM | POA: Diagnosis not present

## 2022-05-30 DIAGNOSIS — E119 Type 2 diabetes mellitus without complications: Secondary | ICD-10-CM | POA: Diagnosis not present

## 2022-05-30 DIAGNOSIS — I5030 Unspecified diastolic (congestive) heart failure: Secondary | ICD-10-CM | POA: Diagnosis not present

## 2022-05-30 DIAGNOSIS — I110 Hypertensive heart disease with heart failure: Secondary | ICD-10-CM | POA: Diagnosis not present

## 2022-05-30 DIAGNOSIS — I10 Essential (primary) hypertension: Secondary | ICD-10-CM | POA: Diagnosis not present

## 2022-05-30 NOTE — Chronic Care Management (AMB) (Signed)
   05/30/2022  Yvonne [name] 01-22-1951 [id number]   Patient has not recently engaged with the [place] at [facility] Removing RN Care Manager from Care Team and closing [place]. I will reach out to [name] and/or [name] to inform them of my case closure.  [name], BSN, RN-BC Embedded Chronic Care Manager [facility] Direct Dial: [phone]

## 2022-05-30 NOTE — Patient Instructions (Signed)
  I have enjoyed working with you through the Chronic Care Management Program at [facility]. If you feel that you need CCM services in the future, please reach out to your primary care provider at [phone] to discuss re-enrollment in the program or re-engagement with the [facility].   Thank you for allowing me to participate in your your healthcare journey.  [name], BSN, RN-BC Embedded Chronic Care Manager [facility] [place] Management Direct Dial: [phone]

## 2022-05-31 ENCOUNTER — Telehealth: Payer: Self-pay

## 2022-05-31 NOTE — Telephone Encounter (Signed)
Transition Care Management Follow-up Telephone Call Date of discharge and from where: 05/30/2022 [facility] How have you been since you were released from the hospital? Pt states she is doing better. Any questions or concerns? No  PT HAS APPOINTMENT ALREADY SCHEDULED FOR 7/12, FOR PX. PT DID NOT WANT TO MAKE A NEW APPOINTMENT FOR TCM BUT WANTED TO KEEP APPT ON 12TH.  Items Reviewed: Did the pt receive and understand the discharge instructions provided? Yes  Medications obtained and verified? Yes  Other? No  Any new allergies since your discharge? No  Dietary orders reviewed? Yes Do you have support at home? Yes   Home Care and Equipment/Supplies: Were home health services ordered? not applicable If so, what is the name of the agency? N/A  Has the agency set up a time to come to the patient's home? not applicable Were any new equipment or medical supplies ordered?  No What is the name of the medical supply agency? N/A Were you able to get the supplies/equipment? not applicable Do you have any questions related to the use of the equipment or supplies? No  Functional Questionnaire: (I = Independent and D = Dependent) ADLs: I  Bathing/Dressing- I  Meal Prep- I  Eating- I  Maintaining continence- I  Transferring/Ambulation- I  Managing Meds- I  Follow up appointments reviewed:  PCP Hospital f/u appt confirmed? Yes  Scheduled to see DOD on 06/06/2022 @ 11:50. [facility] Hospital f/u appt confirmed? Yes  Scheduled to see Cardiology on 06/05/2022 @ 10:05. Are transportation arrangements needed? No  If their condition worsens, is the pt aware to call PCP or go to the Emergency Dept.? Yes Was the patient provided with contact information for the PCP's office or ED? Yes Was to pt encouraged to call back with questions or concerns? Yes

## 2022-06-03 ENCOUNTER — Encounter: Payer: Self-pay | Admitting: Physician Assistant

## 2022-06-03 NOTE — Progress Notes (Deleted)
Cardiology Office Note    Date:  06/03/2022   ID:  Yvonne [name], DOB Dec 15, 1950, MRN [id number]  PCP:  [name], MD  Cardiologist:  None  Electrophysiologist:  None   Chief Complaint: ***  History of Present Illness:   Yvonne [name] is a 71 y.o. female with history of HTN with orthostatic hypotension, DM, HFpEF, CAD by cor CT with negative FFR 01/2021, aortic atherosclerosis, prior strokes, chronic migraine, mild carotid disease by duplex 2020 (<50% BICA),  PSVT who is seen for follow-up. Prior testing reviewed below with moderate nonobstructive CAD by coronary CTA 01/2021 with recommended medical therapy. 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 OP monitor showed NSR, 57-162bpm, 5 PSVT (max 10 sec), rare PACs, PVCs, triggered events correlated with NSR/ST. Last echo 12/2021 EF 65-70%, G1DD, elevated LVEDP, normal RV. She was recently seen in the ED 05/29/22 at outside hospital with chest pain, possible syncopal event, negative troponins with AKI felt due to working out in the sun an dehydration. Per notes, cardiology evaluated patient and recommended to f/u as OP to consider stress test.   Refer to EP ? ILR, needs f/u with neuro Carotid study Lipids @ goal No driving   CAD with HLD with recent chest pain Syncope Essential HTN with history of orthostatic hypotension History of stroke PSVT  Labwork independently reviewed: 05/2022 CBC wnl, K 3.8, Cr 1.07, Mg 2.1, pBNP wnl, trop neg x2, d-dimer neg 04/2022 trig 87, LDL 36 12/2021 TSH wnl   Cardiology Studies:   Studies reviewed are outlined and summarized above. Reports included below if pertinent.   Echo 12/2021    1. Left ventricular ejection fraction, by estimation, is 65 to 70%. The   left ventricle has normal function. The left ventricle has no regional  wall motion abnormalities. There is moderate concentric left ventricular  hypertrophy. Left ventricular  diastolic parameters are consistent with Grade I diastolic dysfunction  (impaired relaxation). Elevated left ventricular end-diastolic pressure.   2. Right ventricular systolic function is normal. The right ventricular  size is normal. There is normal pulmonary artery systolic pressure. The  estimated right ventricular systolic pressure is 63.7 mmHg.   3. Left atrial size was upper normal.   4. The mitral valve is grossly normal. No evidence of mitral valve  regurgitation.   5. The aortic valve is tricuspid. Aortic valve regurgitation is not  visualized. No aortic stenosis is present. Aortic valve mean gradient  measures 3.0 mmHg.   6. The inferior vena cava is normal in size with greater than 50%  respiratory variability, suggesting right atrial pressure of 3 mmHg.   Comparison(s): Prior images reviewed side by side. LVEF remains normal  range.   Monitor 01/2022 Patient had a minimum heart rate of 57 bpm, maximum heart rate of 162 bpm, and average heart rate of 79 bpm. Predominant underlying rhythm was sinus rhythm. Five runs of P-SVT longest lasting 10 seconds, fastest rate of 162 bpm. Isolated PACs were rare (<1.0%). Isolated PVCs were rare (<1.0%). Triggered events were sinus rhythm and sinus tachycardia   Asymptomatic P-SVT.  Monitor 01/2021 Patient had a minimum heart rate of 53 bpm, maximum heart rate of 160 bpm, and average heart rate of 73 bpm. Predominant underlying rhythm was sinus rhythm. Six runs of suprventricular tachycardia  occurred lasting 7 beats at longest with a max rate of 160 bpm at fastest. Isolated PACs were rare (<1.0%), with rare couplets and triplets present. Isolated PVCs were rare (<1.0%). No evidence of complete heart block. Triggered and diary events associated with sinus  rhythm.   No malignant arrhythmias.  Cor CT 01/2021 EXAM: Cardiac/Coronary  CTA   TECHNIQUE: The patient was scanned on a [employer].   FINDINGS: A 100 kV prospective scan was triggered in the descending thoracic aorta at 111 HU's. Axial non-contrast 3 mm slices were carried out through the heart. The data set was analyzed on a dedicated work station and scored using the [employer]. Gantry rotation speed was 250 msecs and collimation was .6 mm. No beta blockade and 0.8 mg of sl NTG was given. The 3D data set was reconstructed in 5% intervals of the 67-82 % of the R-R cycle. Diastolic phases were analyzed on a dedicated work station using MPR, MIP and VRT modes. The patient received 100 cc of contrast.   Aorta:  Normal size.  Aortic atherosclerosis noted.  No dissection.   Aortic Valve:  Tri-leaflet.  No calcifications.   Coronary Arteries:  Normal coronary origin.  Right dominance.   Coronary calcium score of 736. This was 94th percentile for age, sex, and race matched control.   RCA is a large dominant artery that gives rise to PDA and PLA. There is a mild non-obstructive (25-49%) calcified plaque in the ostium of the vessel. There are mild non-obstructive (25-49%) mixed plaques scattered in the proximal and mid vessel. There are two minimal non-obstructive (1-24%) calcified plaques in the mid and distal vessel.   Left main is a large artery that gives rise to LAD and LCX arteries. There is a minimal (< 10%) ostial calcified plaque.   LAD is a large vessel that gives rise to one large D1 Branch that bifurcates and a secondary D2 vessel. There is a mild non-obstructive (25-49%) calcified plaque in the proximal vessel. There is a moderate stenosis (50-70%) mixed and calcified plaque proximal-mid vessel- there is a napkin ring's sign. There is a mild non-obstructive (25-49%) calcified plaque in the mid vessel after the moderate stenosis. There is a minimal  non-obstructive (1-24%) calcified plaque in the D2 vessel.   LCX is a non-dominant artery that gives rise to one small OM1 vessel. There is no plaque.   Other findings:   Normal pulmonary vein drainage into the left atrium.   Normal left atrial appendage without a thrombus.   Normal size of the pulmonary artery.   Extra-cardiac findings: See attached radiology report for non-cardiac structures.   IMPRESSION: 1. Coronary calcium score of 736. This was 94th percentile for age, sex, and race matched control.   2. Normal coronary origin.  Right dominance.   3. CAD-RADS 3. Moderate stenosis. Consider symptom-guided anti-ischemic pharmacotherapy as well as risk factor modification per guideline directed care. Additional analysis with CT FFR will be submitted.   4.  Aortic atherosclerosis noted.     Electronically Signed   By: Rudean Haskell MD   On: 02/09/2021 15:44  FINDINGS: Atherosclerotic calcifications in the thoracic aorta. Within the visualized portions of the thorax there are no suspicious appearing pulmonary nodules or masses, there is no acute consolidative airspace disease, no pleural effusions, no pneumothorax and no lymphadenopathy. Visualized portions of the upper abdomen are unremarkable. There are no aggressive appearing lytic or blastic lesions noted in the visualized portions of the skeleton.  IMPRESSION: 1.  Aortic Atherosclerosis (ICD10-I70.0).   Electronically Signed: By: Vinnie Langton M.D. On: 02/09/2021 10:04  FFR report FINDINGS: CT-FFR analysis was performed on the original cardiac CT angiogram dataset. Diagrammatic representation of the CT-FFR analysis is provided in a separate PDF document in PACS. This dictation was created using the PDF document and an interactive 3D model of the results. 3D model is not available in the EMR/PACS. Normal FFR range is >0.80.   1. Left Main: No significant functional stenosis, CT-FFR 0.99.    2. LAD: No significant functional stenosis, CT-FFR 0.87 distal LAD, except for distal tip CT-FFR 0.79. 3. LCX: No significant functional stenosis, CT-FFR 0.98 distal vessel. 4. RCA: No significant functional stenosis, CT-FFR 0.91 distal vessel.   IMPRESSION: 1. CT FFR analysis shows evidence of significant functional stenosis only at the distal tip of the LAD.   Rudean Haskell MD     Electronically Signed   By: Rudean Haskell MD   On: 02/12/2021 10:39   Carotid duplex 2020 IMPRESSION: 1. Bilateral carotid bifurcation plaque resulting in less than 50% diameter ICA stenosis. 2. Antegrade bilateral vertebral arterial flow.     Electronically Signed   By: Lucrezia Europe M.D.   On: 06/11/2019 15:44      Past Medical History:  Diagnosis Date   Anxiety    Arthritis    left hand   Asthma    daily and prn inhalers   Chronic cough    Chronic otitis media 12/2017   Full dentures    GERD (gastroesophageal reflux disease)    History of stroke 09/2017   weakness right hand, numbness right side face   Hyperlipidemia    Hypertension    states under control with meds., has been on med. x a long time, per pt.   Non-insulin dependent type 2 diabetes mellitus ([place])    Overactive bladder    Recurrent acute suppurative otitis media without spontaneous rupture of left tympanic membrane 02/19/2018   Stroke ([place])    Transient cerebral ischemia     Past Surgical History:  Procedure Laterality Date   ABDOMINAL HYSTERECTOMY     complete   CATARACT EXTRACTION W/ INTRAOCULAR LENS IMPLANT Right    CHOLECYSTECTOMY     COLONOSCOPY N/A 08/17/2015   Procedure: COLONOSCOPY;  Surgeon: Rogene Houston, MD;  Location: [facility];  Service: Endoscopy;  Laterality: N/A;  200 - moved to 7:30 - Ann notified pt   GAS INSERTION Right    x 2 - eye   LACRIMAL DUCT EXPLORATION Bilateral    removal of tear ducts   MYRINGOTOMY WITH TUBE PLACEMENT Bilateral 01/28/2018   Procedure:  BILATERAL MYRINGOTOMY WITH TUBE PLACEMENT;  Surgeon: Leta Baptist, MD;  Location: [place];  Service: ENT;  Laterality: Bilateral;   VITRECTOMY Left 09/14/2015    Current Medications: No outpatient medications have been marked as taking for the 06/05/22 encounter (Appointment) with Charlie Pitter, PA-C.   ***   Allergies:   Penicillins, Gabapentin, Lisinopril, and Soap   Social History   Socioeconomic History   Marital status: Married    Spouse name: Not on file   Number of children: 3   Years of education: Not on file   Highest education level: GED or equivalent  Occupational History   Occupation: retired    Comment: Customer service manager and restaurants  Tobacco Use   Smoking status: Never   Smokeless tobacco: Never  Vaping Use   Vaping Use:  Never used  Substance and Sexual Activity   Alcohol use: Not Currently   Drug use: No   Sexual activity: Yes  Other Topics Concern   Not on file  Social History Narrative   Lives at home home with husband with    Son, daughter in law and 3 children.  [name], his girlfriend and her child also moved in 10/2018      Disabled.   Social Determinants of Health   Financial Resource Strain: Medium Risk (03/01/2022)   Overall Financial Resource Strain (CARDIA)    Difficulty of Paying Living Expenses: Somewhat hard  Food Insecurity: No Food Insecurity (03/01/2022)   Hunger Vital Sign    Worried About Running Out of Food in the Last Year: Never true    Ran Out of Food in the Last Year: Never true  Transportation Needs: No Transportation Needs (03/01/2022)   PRAPARE - Hydrologist (Medical): No    Lack of Transportation (Non-Medical): No  Physical Activity: Sufficiently Active (03/01/2022)   Exercise Vital Sign    Days of Exercise per Week: 5 days    Minutes of Exercise per Session: 30 min  Stress: Stress Concern Present (03/01/2022)   [place]    Feeling of Stress : Rather much  Social Connections: Socially Integrated (03/01/2022)   Social Connection and Isolation Panel [NHANES]    Frequency of Communication with Friends and Family: More than three times a week    Frequency of Social Gatherings with Friends and Family: More than three times a week    Attends Religious Services: 1 to 4 times per year    Active Member of [employer] or Organizations: Yes    Attends Music therapist: More than 4 times per year    Marital Status: Married     Family History:  The patient's ***family history includes Arthritis in her brother and mother; Arthritis-Osteo in her son; Asthma in her father; CVA in her mother; Cancer in her brother; Diabetes in her father and mother; Heart disease in her father; Hepatitis C in her sister; Peripheral Artery Disease in her daughter; Stroke in her mother.  ROS:   Please see the history of present illness. Otherwise, review of systems is positive for ***.  All other systems are reviewed and otherwise negative.    EKG(s)/Additional Labs   EKG:  EKG is ordered today, personally reviewed, demonstrating ***  Recent Labs: 01/17/2022: TSH 0.766 03/01/2022: BNP 58.4 05/03/2022: ALT 37; BUN 17; Creatinine, Ser 0.85; Hemoglobin 13.2; Platelets 244; Potassium 4.6; Sodium 139  Recent Lipid Panel    Component Value Date/Time   CHOL 106 05/03/2022 0827   TRIG 87 05/03/2022 0827   HDL 53 05/03/2022 0827   CHOLHDL 2.0 05/03/2022 0827   CHOLHDL 3.3 01/17/2022 0444   VLDL 11 01/17/2022 0444   LDLCALC 36 05/03/2022 0827    PHYSICAL EXAM:    VS:  There were no vitals taken for this visit.  BMI: There is no height or weight on file to calculate BMI.  GEN: Well nourished, well developed female in no acute distress HEENT: normocephalic, atraumatic Neck: no JVD, carotid bruits, or masses Cardiac: ***RRR; no murmurs, rubs, or gallops, no edema  Respiratory:  clear to auscultation bilaterally, normal  work of breathing GI: soft, nontender, nondistended, + BS MS: no deformity or atrophy Skin: warm and dry, no rash Neuro:  Alert and Oriented x 3, Strength and sensation  are intact, follows commands Psych: euthymic mood, full affect  Wt Readings from Last 3 Encounters:  05/15/22 180 lb (81.6 kg)  05/03/22 179 lb 6.4 oz (81.4 kg)  04/10/22 177 lb 9.6 oz (80.6 kg)     ASSESSMENT & PLAN:   ***     Disposition: F/u with ***   Medication Adjustments/Labs and Tests Ordered: Current medicines are reviewed at length with the patient today.  Concerns regarding medicines are outlined above. Medication changes, Labs and Tests ordered today are summarized above and listed in the Patient Instructions accessible in Encounters.   Signed, Charlie Pitter, PA-C  06/03/2022 10:31 AM    [place] Phone: [phone]; Fax: [phone]

## 2022-06-04 ENCOUNTER — Telehealth: Payer: Self-pay

## 2022-06-04 NOTE — Telephone Encounter (Signed)
Patient informed that four boxes of Ozempic '8mg'$ /97m have came in and will be placed in the refrigerator for her to pick up.

## 2022-06-05 ENCOUNTER — Ambulatory Visit: Payer: Medicare HMO | Admitting: Physician Assistant

## 2022-06-05 DIAGNOSIS — I2510 Atherosclerotic heart disease of native coronary artery without angina pectoris: Secondary | ICD-10-CM

## 2022-06-05 DIAGNOSIS — I10 Essential (primary) hypertension: Secondary | ICD-10-CM

## 2022-06-05 DIAGNOSIS — I471 Supraventricular tachycardia: Secondary | ICD-10-CM

## 2022-06-05 DIAGNOSIS — E785 Hyperlipidemia, unspecified: Secondary | ICD-10-CM

## 2022-06-05 DIAGNOSIS — R55 Syncope and collapse: Secondary | ICD-10-CM

## 2022-06-05 DIAGNOSIS — Z8673 Personal history of transient ischemic attack (TIA), and cerebral infarction without residual deficits: Secondary | ICD-10-CM

## 2022-06-06 ENCOUNTER — Ambulatory Visit (INDEPENDENT_AMBULATORY_CARE_PROVIDER_SITE_OTHER): Payer: Medicare HMO | Admitting: Family Medicine

## 2022-06-06 ENCOUNTER — Encounter: Payer: Self-pay | Admitting: Family Medicine

## 2022-06-06 VITALS — HR 68 | Temp 97.6°F | Ht 63.0 in | Wt 175.8 lb

## 2022-06-06 DIAGNOSIS — T675XXA Heat exhaustion, unspecified, initial encounter: Secondary | ICD-10-CM | POA: Diagnosis not present

## 2022-06-06 DIAGNOSIS — R55 Syncope and collapse: Secondary | ICD-10-CM | POA: Diagnosis not present

## 2022-06-06 DIAGNOSIS — E1165 Type 2 diabetes mellitus with hyperglycemia: Secondary | ICD-10-CM

## 2022-06-06 NOTE — Progress Notes (Signed)
Subjective:  Patient ID: Yvonne [name], female    DOB: 08/08/1951  Age: 71 y.o. MRN: [id number]  CC: Annual Exam and Transitions Of Care   HPI Yvonne [name] presents for chest pain admission at [facility] on [address]. Pt. Had ben working in the yard. Denies having had chest pain. [place] out in her yard and husband took her to the hospital for possible heat stroke. She stood up and felt dizzy and fell down the hill "a little bit." Had fluids at hospital. Blood was drawn every two hours. DCed the following morning after tests came back normal.     06/06/2022   11:34 AM 05/15/2022    8:55 AM 05/03/2022    8:20 AM  Depression screen PHQ 2/9  Decreased Interest 1 2 2   Down, Depressed, Hopeless 2 3 2   PHQ - 2 Score 3 5 4   Altered sleeping 3 2 3   Tired, decreased energy 2 2 1   Change in appetite 3 3 3   Feeling bad or failure about yourself  2 2 2   Trouble concentrating 1 2 0  Moving slowly or fidgety/restless 2 2 3   Suicidal thoughts 0 0 0  PHQ-9 Score 16 18 16   Difficult doing work/chores Somewhat difficult Somewhat difficult Extremely dIfficult    History Yvonne [name] has a past medical history of (HFpEF) heart failure with preserved ejection fraction ([place]), Anxiety, Aortic atherosclerosis ([place]), Arthritis, Asthma, CAD (coronary artery disease), Carotid artery disease ([place]), Chronic cough, Chronic otitis media (12/2017), Full dentures, GERD (gastroesophageal reflux disease), History of stroke (09/2017), Hyperlipidemia, Hypertension, Non-insulin dependent type 2 diabetes mellitus ([place]), Orthostatic hypotension, Overactive bladder, PSVT (paroxysmal supraventricular tachycardia) ([place]), Recurrent acute suppurative otitis media without spontaneous rupture of left tympanic membrane (02/19/2018), Stroke ([place]), and Transient cerebral ischemia.   She has a past surgical history that includes Cholecystectomy; Colonoscopy (N/A, 08/17/2015); Abdominal hysterectomy; Lacrimal duct exploration (Bilateral); Cataract  extraction w/ intraocular lens implant (Right); Vitrectomy (Left, 09/14/2015); Gas insertion (Right); and Myringotomy with tube placement (Bilateral, 01/28/2018).   Her family history includes Arthritis in her brother and mother; Arthritis-Osteo in her son; Asthma in her father; CVA in her mother; Cancer in her brother; Diabetes in her father and mother; Heart disease in her father; Hepatitis C in her sister; Peripheral Artery Disease in her daughter; Stroke in her mother.She reports that she has never smoked. She has never used smokeless tobacco. She reports that she does not currently use alcohol. She reports that she does not use drugs.    ROS Review of Systems  Constitutional: Negative.   HENT:  Negative for congestion.   Eyes:  Negative for visual disturbance.  Respiratory:  Negative for shortness of breath.   Cardiovascular:  Negative for chest pain.  Gastrointestinal:  Negative for abdominal pain, constipation, diarrhea, nausea and vomiting.  Genitourinary:  Negative for difficulty urinating.  Musculoskeletal:  Negative for arthralgias and myalgias.  Neurological:  Negative for headaches.  Psychiatric/Behavioral:  Negative for sleep disturbance.     Objective:  Pulse 68   Temp 97.6 F (36.4 C)   Ht 5\' 3"  (1.6 m)   Wt 175 lb 12.8 oz (79.7 kg)   SpO2 99%   BMI 31.14 kg/m   BP Readings from Last 3 Encounters:  05/15/22 (!) 144/81  05/03/22 111/65  04/10/22 118/67    Wt Readings from Last 3 Encounters:  06/06/22 175 lb 12.8 oz (79.7 kg)  05/15/22 180 lb (81.6 kg)  05/03/22 179 lb 6.4 oz (81.4 kg)  Physical Exam Constitutional:      General: She is not in acute distress.    Appearance: She is well-developed.  HENT:     Head: Normocephalic and atraumatic.  Eyes:     Conjunctiva/sclera: Conjunctivae normal.     Pupils: Pupils are equal, round, and reactive to light.  Neck:     Thyroid: No thyromegaly.  Cardiovascular:     Rate and Rhythm: Normal rate and  regular rhythm.     Heart sounds: Normal heart sounds. No murmur heard. Pulmonary:     Effort: Pulmonary effort is normal. No respiratory distress.     Breath sounds: Normal breath sounds. No wheezing or rales.  Abdominal:     General: Bowel sounds are normal. There is no distension.     Palpations: Abdomen is soft.     Tenderness: There is no abdominal tenderness.  Musculoskeletal:        General: Normal range of motion.     Cervical back: Normal range of motion and neck supple.  Lymphadenopathy:     Cervical: No cervical adenopathy.  Skin:    General: Skin is warm and dry.  Neurological:     Mental Status: She is alert and oriented to person, place, and time.  Psychiatric:        Behavior: Behavior normal.        Thought Content: Thought content normal.        Judgment: Judgment normal.       Assessment & Plan:   Yvonne [name] was seen today for annual exam and transitions of care.  Diagnoses and all orders for this visit:  Uncontrolled type 2 diabetes mellitus with hyperglycemia, without long-term current use of insulin (HCC)  Syncope and collapse  Heat exhaustion, initial encounter       I am having Yvonne [name] maintain her Accu-Chek Softclix Lancets, atorvastatin, nitroGLYCERIN, famotidine, Accu-Chek Guide, amLODipine, glipiZIDE, clopidogrel, Ozempic (2 MG/DOSE), dapagliflozin propanediol, Fexofenadine HCl (ALLEGRA PO), vitamin B-12, aspirin, oxybutynin, Nexlizet, loratadine, alendronate, albuterol, Accu-Chek Guide, triamcinolone ointment, diclofenac Sodium, ALPRAZolam, escitalopram, ciprofloxacin-dexamethasone, and furosemide.  Allergies as of 06/06/2022       Reactions   Penicillins Hives, Itching   Has patient had a PCN reaction causing immediate rash, facial/tongue/throat swelling, SOB or lightheadedness with hypotension: Yes Has patient had a PCN reaction causing severe rash involving mucus membranes or skin necrosis: Unk Has patient had a PCN reaction that  required hospitalization: No Has patient had a PCN reaction occurring within the last 10 years: No If all of the above answers are "NO", then may proceed with Cephalosporin use. Has patient had a PCN reaction causing immediate rash, facial/tongue/throat swelling, SOB or lightheadedness with hypotension: Yes Has patient had a PCN reaction causing severe rash involving mucus membranes or skin necrosis: No Has patient had a PCN reaction that required hospitalization No Has patient had a PCN reaction occurring within the last 10 years: No If all of the above answers are "NO", then may proceed with Cephalosporin use. Has patient had a PCN reaction causing immediate rash, facial/tongue/throat swelling, SOB or lightheadedness with hypotension: Yes Has patient had a PCN reaction causing severe rash involving mucus membranes or skin necrosis: Unk Has patient had a PCN reaction that required hospitalization: No Has patient had a PCN reaction occurring within the last 10 years: No If all of the above answers are "NO", then may proceed with Cephalosporin use. Has patient had a PCN reaction causing immediate rash, facial/tongue/throat swelling, SOB or lightheadedness  with hypotension: Yes Has patient had a PCN reaction causing severe rash involving mucus membranes or skin necrosis: No Has patient had a PCN reaction that required hospitalization No Has patient had a PCN reaction occurring within the last 10 years: No If all of the above answers are "NO", then may proceed with Cephalosporin use. Has patient had a PCN reaction causing immediate rash, facial/tongue/throat swelling, SOB or lightheadedness with hypotension: Yes Has patient had a PCN reaction causing sev... (TRUNCATED)   Gabapentin Other (See Comments)   Causes a lot of lethargy at a higher dose Causes a lot of lethargy at a higher dose Causes a lot of lethargy at a higher dose   Lisinopril Cough   HAS A CHRONIC COUGH AND DID NOT NOTICE  IMPROVEMENT OF COUGH WHEN LISINOPRIL WAS STOPPED HAS A CHRONIC COUGH AND DID NOT NOTICE IMPROVEMENT OF COUGH WHEN LISINOPRIL WAS STOPPED HAS A CHRONIC COUGH AND DID NOT NOTICE IMPROVEMENT OF COUGH WHEN LISINOPRIL WAS STOPPED   Soap Rash   DIAL soap        Medication List        Accurate as of June 06, 2022  7:41 PM. If you have any questions, ask your nurse or doctor.          Accu-Chek Guide test strip Generic drug: glucose blood TEST BLOOD SUGAR 4 TIMES DAILY DX E11.69   Accu-Chek Guide w/Device Kit Test Bs QID Dx E11.69   Accu-Chek Softclix Lancets lancets Test BS up to 4 times daily Dx E11.69   albuterol 108 (90 Base) MCG/ACT inhaler Commonly known as: VENTOLIN HFA TAKE 2 PUFFS BY MOUTH EVERY 6 HOURS AS NEEDED FOR WHEEZE OR SHORTNESS OF BREATH   alendronate 70 MG tablet Commonly known as: FOSAMAX TAKE 1 TABLET EVERY 7 DAYS WITH A FULL GLASS OF WATER ON AN EMPTY STOMACH   ALLEGRA PO Take 1 tablet by mouth daily.   ALPRAZolam 1 MG tablet Commonly known as: XANAX ! Each morning, one each evening, and 1/2 each afternoon   amLODipine 10 MG tablet Commonly known as: NORVASC TAKE 1 TABLET BY MOUTH EVERY DAY   aspirin 81 MG chewable tablet Chew 1 tablet (81 mg total) by mouth daily. X 90 days   atorvastatin 80 MG tablet Commonly known as: LIPITOR Take 1 tablet (80 mg total) by mouth daily.   ciprofloxacin-dexamethasone OTIC suspension Commonly known as: CIPRODEX Administer 4 drops into the left ear Two (2) times a day.   clopidogrel 75 MG tablet Commonly known as: PLAVIX TAKE 1 TABLET BY MOUTH EVERY DAY WITH BREAKFAST   dapagliflozin propanediol 10 MG Tabs tablet Commonly known as: FARXIGA Take 1 tablet (10 mg total) by mouth daily.   diclofenac Sodium 1 % Gel Commonly known as: Voltaren Apply 4 g topically 4 (four) times daily.   escitalopram 10 MG tablet Commonly known as: Lexapro Take 1 tablet (10 mg total) by mouth daily.   famotidine 20 MG  tablet Commonly known as: PEPCID Take 20 mg by mouth at bedtime.   furosemide 20 MG tablet Commonly known as: LASIX TAKE 1 TABLET BY MOUTH EVERY DAY   glipiZIDE 5 MG tablet Commonly known as: GLUCOTROL Take 1 tablet (5 mg total) by mouth daily before breakfast.   loratadine 10 MG tablet Commonly known as: CLARITIN Take 1 tablet (10 mg total) by mouth daily.   Nexlizet 180-10 MG Tabs Generic drug: Bempedoic Acid-Ezetimibe Take 180 mg by mouth daily.   nitroGLYCERIN 0.4 MG SL tablet  Commonly known as: NITROSTAT Place 1 tablet (0.4 mg total) under the tongue every 5 (five) minutes as needed for chest pain.   oxybutynin 5 MG 24 hr tablet Commonly known as: DITROPAN-XL Take 5 mg by mouth at bedtime.   Ozempic (2 MG/DOSE) 8 MG/3ML Sopn Generic drug: Semaglutide (2 MG/DOSE) Inject 2 mg into the skin once a week.   triamcinolone ointment 0.5 % Commonly known as: KENALOG Apply 1 application. topically 2 (two) times daily.   vitamin B-12 500 MCG tablet Commonly known as: CYANOCOBALAMIN Take 1 tablet (500 mcg total) by mouth daily.         Follow-up: Return in about 3 months (around 09/06/2022).  [name], M.D.

## 2022-06-15 ENCOUNTER — Encounter: Payer: Self-pay | Admitting: Nurse Practitioner

## 2022-06-15 ENCOUNTER — Ambulatory Visit (INDEPENDENT_AMBULATORY_CARE_PROVIDER_SITE_OTHER): Payer: Medicare HMO | Admitting: Nurse Practitioner

## 2022-06-15 VITALS — BP 145/80 | HR 66 | Temp 97.8°F | Ht 63.0 in | Wt 176.2 lb

## 2022-06-15 DIAGNOSIS — H01001 Unspecified blepharitis right upper eyelid: Secondary | ICD-10-CM

## 2022-06-15 MED ORDER — BACITRACIN-POLYMYXIN B 500-10000 UNIT/GM OP OINT: 1.0000 | TOPICAL_OINTMENT | Freq: Two times a day (BID) | OPHTHALMIC | 0 refills | Status: DC

## 2022-06-15 NOTE — Patient Instructions (Addendum)
Stye ?A stye, also known as a hordeolum, is a bump that forms on an eyelid. It may look like a pimple next to the eyelash. A stye can form inside the eyelid (internal stye) or outside the eyelid (external stye). A stye can cause redness, swelling, and pain on the eyelid. ?Styes are very common. Anyone can get them at any age. They usually occur in just one eye at a time, but you may have more than one in either eye. ?What are the causes? ?A stye is caused by an infection. The infection is almost always caused by bacteria called Staphylococcus aureus. This is a common type of bacteria that lives on the skin. ?An internal stye may result from an infected oil-producing gland inside the eyelid. An external stye may be caused by an infection at the base of the eyelash (hair follicle). ?What increases the risk? ?You are more likely to develop a stye if: ?You have had a stye before. ?You have any of these conditions: ?Red, itchy, inflamed eyelids (blepharitis). ?A skin condition such as seborrheic dermatitis or rosacea. ?High fat levels in your blood (lipids). ?Dry eyes. ?What are the signs or symptoms? ?The most common symptom of a stye is eyelid pain. Internal styes are more painful than external styes. Other symptoms may include: ?Painful swelling of your eyelid. ?A scratchy feeling in your eye. ?Tearing and redness of your eye. ?A pimple-like bump on the edge of the eyelid. ?Pus draining from the stye. ?How is this diagnosed? ?Your health care provider may be able to diagnose a stye just by examining your eye. The health care provider may also check to make sure: ?You do not have a fever or other signs of a more serious infection. ?The infection has not spread to other parts of your eye or areas around your eye. ?How is this treated? ?Most styes will clear up in a few days without treatment or with warm compresses applied to the area. You may need to use antibiotic drops or ointment to treat an infection. Sometimes,  steroid drops or ointment are used in addition to antibiotics. ?In some cases, your health care provider may give you a small steroid injection in the eyelid. ?If your stye does not heal with routine treatment, your health care provider may drain pus from the stye using a thin blade or needle. This may be done if the stye is large, causing a lot of pain, or affecting your vision. ?Follow these instructions at home: ?Take over-the-counter and prescription medicines only as told by your health care provider. This includes eye drops or ointments. ?If you were prescribed an antibiotic medicine, steroid medicine, or both, apply or use them as told by your health care provider. Do not stop using the medicine even if your condition improves. ?Apply a warm, wet cloth (warm compress) to your eye for 5-10 minutes, 4 to 6 times a day. ?Clean the affected eyelid as directed by your health care provider. ?Do not wear contact lenses or eye makeup until your stye has healed and your health care provider says that it is safe. ?Do not try to pop or drain the stye. ?Do not rub your eye. ?Contact a health care provider if: ?You have chills or a fever. ?Your stye does not go away after several days. ?Your stye affects your vision. ?Your eyeball becomes swollen, red, or painful. ?Get help right away if: ?You have pain when moving your eye around. ?Summary ?A stye is a bump that forms   on an eyelid. It may look like a pimple next to the eyelash. ?A stye can form inside the eyelid (internal stye) or outside the eyelid (external stye). A stye can cause redness, swelling, and pain on the eyelid. ?Your health care provider may be able to diagnose a stye just by examining your eye. ?Apply a warm, wet cloth (warm compress) to your eye for 5-10 minutes, 4 to 6 times a day. ?This information is not intended to replace advice given to you by your health care provider. Make sure you discuss any questions you have with your health care  provider. ?Document Revised: 01/18/2021 Document Reviewed: 01/18/2021 ?Elsevier Patient Education ? 2023 [employer] Inc[employer] ? ?

## 2022-06-15 NOTE — Progress Notes (Signed)
Acute Office Visit  Subjective:     Patient ID: Yvonne [name], female    DOB: 12-18-1950, 71 y.o.   MRN: [id number]  Chief Complaint  Patient presents with   Facial Swelling    Right eye swelling x 3 days    Eye Problem  The right eye is affected. This is a new problem. Episode onset: 3 days ago. The problem has been rapidly worsening. There was no injury mechanism. The pain is at a severity of 4/10. The pain is moderate. There is No known exposure to pink eye. She Does not wear contacts. Associated symptoms include an eye discharge and eye redness. Pertinent negatives include no blurred vision, fever, foreign body sensation or photophobia. She has tried nothing for the symptoms.    Review of Systems  Constitutional: Negative.  Negative for fever.  HENT:  Positive for ear discharge and ear pain.   Eyes:  Positive for discharge and redness. Negative for blurred vision and photophobia.  Respiratory: Negative.    Cardiovascular: Negative.   Genitourinary: Negative.   Skin: Negative.   All other systems reviewed and are negative.       Objective:    BP (!) 145/80   Pulse 66   Temp 97.8 F (36.6 C) (Temporal)   Ht '5\' 3"'$  (1.6 m)   Wt 176 lb 3.2 oz (79.9 kg)   SpO2 96%   BMI 31.21 kg/m  BP Readings from Last 3 Encounters:  06/15/22 (!) 145/80  05/15/22 (!) 144/81  05/03/22 111/65   Wt Readings from Last 3 Encounters:  06/15/22 176 lb 3.2 oz (79.9 kg)  06/06/22 175 lb 12.8 oz (79.7 kg)  05/15/22 180 lb (81.6 kg)      Physical Exam Vitals and nursing note reviewed.  Constitutional:      Appearance: Normal appearance.  HENT:     Head: Normocephalic.     Right Ear: External ear normal.     Left Ear: External ear normal.     Nose: Nose normal.     Mouth/Throat:     Mouth: Mucous membranes are moist.  Eyes:     General:        Right eye: Discharge and hordeolum present. No foreign body.     Conjunctiva/sclera: Conjunctivae normal.  Cardiovascular:     Rate and  Rhythm: Normal rate and regular rhythm.     Pulses: Normal pulses.     Heart sounds: Normal heart sounds.  Pulmonary:     Effort: Pulmonary effort is normal.     Breath sounds: Normal breath sounds.  Abdominal:     General: Bowel sounds are normal.  Neurological:     Mental Status: She is alert.     No results found for any visits on 06/15/22.      Assessment & Plan:  Patient presents with painful right eye discharge with swelling in the past 3 days.  Patient was unable to go to eye doctor or the emergency room.  After assessment started patient on bacitracin eye ointment, warm compress, do not scratch or force open stye.  Tylenol ibuprofen as needed for pain.  Advised patient to walk into eye clinic for further assessment.  Patient verbalized understanding. follow-up with worsening unresolved symptoms. Problem List Items Addressed This Visit   None Visit Diagnoses     Blepharitis of right upper eyelid, unspecified type    -  Primary   Relevant Medications   bacitracin-polymyxin b (POLYSPORIN) ophthalmic ointment  Meds ordered this encounter  Medications   bacitracin-polymyxin b (POLYSPORIN) ophthalmic ointment    Sig: Place 1 Application into the right eye every 12 (twelve) hours. apply to eye every 12 hours while awake    Dispense:  3.5 g    Refill:  0    Order Specific Question:   Supervising Provider    Answer:   [name] [[id number]]    Return if symptoms worsen or fail to improve.  Ivy Lynn, NP

## 2022-06-18 ENCOUNTER — Ambulatory Visit
Admission: RE | Admit: 2022-06-18 | Discharge: 2022-06-18 | Disposition: A | Discharge status: Home / Self Care | Payer: Medicare HMO | Source: Ambulatory Visit | Attending: Family Medicine | Admitting: Family Medicine

## 2022-06-18 DIAGNOSIS — Z1231 Encounter for screening mammogram for malignant neoplasm of breast: Secondary | ICD-10-CM | POA: Diagnosis not present

## 2022-06-29 DIAGNOSIS — G43511 Persistent migraine aura without cerebral infarction, intractable, with status migrainosus: Secondary | ICD-10-CM | POA: Diagnosis not present

## 2022-06-29 DIAGNOSIS — R110 Nausea: Secondary | ICD-10-CM | POA: Diagnosis not present

## 2022-07-02 DIAGNOSIS — L820 Inflamed seborrheic keratosis: Secondary | ICD-10-CM | POA: Diagnosis not present

## 2022-07-02 DIAGNOSIS — B078 Other viral warts: Secondary | ICD-10-CM | POA: Diagnosis not present

## 2022-07-20 ENCOUNTER — Ambulatory Visit (INDEPENDENT_AMBULATORY_CARE_PROVIDER_SITE_OTHER): Payer: Medicare HMO | Admitting: Family Medicine

## 2022-07-20 ENCOUNTER — Encounter: Payer: Self-pay | Admitting: Family Medicine

## 2022-07-20 VITALS — BP 120/64 | HR 71 | Temp 98.2°F | Ht 63.0 in | Wt 174.2 lb

## 2022-07-20 DIAGNOSIS — M79631 Pain in right forearm: Secondary | ICD-10-CM | POA: Diagnosis not present

## 2022-07-20 NOTE — Progress Notes (Signed)
   Acute Office Visit  Subjective:     Patient ID: Yvonne [name], female    DOB: 1951/05/02, 71 y.o.   MRN: [id number]  Chief Complaint  Patient presents with   Arm Pain    HPI Patient is in today for right forearm pain x 2-3 days. She reports an ache in the area. Denies injury mechanism. Denies swelling, erythema, fever, chills, exudate. She does have tingling in her fingers due to carpal tunnel. The pain improves with rest. She has not tried anything for the symptoms.   Review of Systems  Constitutional:  Negative for chills and fever.  Cardiovascular:  Negative for chest pain and palpitations.  Musculoskeletal:  Positive for myalgias. Negative for joint pain.  Skin:  Negative for rash.  Neurological:  Positive for tingling. Negative for focal weakness and weakness.        Objective:    BP 120/64   Pulse 71   Temp 98.2 F (36.8 C) (Temporal)   Ht '5\' 3"'$  (1.6 m)   Wt 174 lb 4 oz (79 kg)   SpO2 99%   BMI 30.87 kg/m    Physical Exam Vitals and nursing note reviewed.  Constitutional:      General: She is not in acute distress.    Appearance: She is not ill-appearing, toxic-appearing or diaphoretic.  Pulmonary:     Effort: Pulmonary effort is normal. No respiratory distress.  Musculoskeletal:     Right elbow: No swelling, deformity or effusion. Normal range of motion. No tenderness.     Right forearm: No swelling, edema, deformity, lacerations, tenderness or bony tenderness.     Right wrist: Normal.     Right hand: No swelling, deformity or tenderness. Normal range of motion. Normal strength. Normal capillary refill.     Right lower leg: No edema.     Left lower leg: No edema.  Skin:    General: Skin is warm and dry.  Neurological:     Mental Status: She is alert and oriented to person, place, and time.  Psychiatric:        Mood and Affect: Mood normal.        Behavior: Behavior normal.     No results found for any visits on 07/20/22.      Assessment &  Plan:   Nabiha was seen today for arm pain.  Diagnoses and all orders for this visit:  Right forearm pain Benign exam today. Discussed RICE therapy, tylenol for pain.   Return to office for new or worsening symptoms, or if symptoms persist.   The patient indicates understanding of these issues and agrees with the plan.  Gwenlyn Perking, FNP

## 2022-08-01 ENCOUNTER — Other Ambulatory Visit: Payer: Self-pay

## 2022-08-01 ENCOUNTER — Other Ambulatory Visit: Payer: Self-pay | Admitting: Family Medicine

## 2022-08-01 MED ORDER — ATORVASTATIN CALCIUM 80 MG PO TABS: 80.0000 mg | ORAL_TABLET | Freq: Every day | ORAL | 2 refills | Status: DC

## 2022-08-07 ENCOUNTER — Other Ambulatory Visit: Payer: Self-pay | Admitting: Family Medicine

## 2022-08-07 ENCOUNTER — Ambulatory Visit (INDEPENDENT_AMBULATORY_CARE_PROVIDER_SITE_OTHER): Payer: Medicare HMO | Admitting: Family Medicine

## 2022-08-07 ENCOUNTER — Encounter: Payer: Self-pay | Admitting: Family Medicine

## 2022-08-07 VITALS — BP 136/75 | HR 73 | Temp 97.5°F | Ht 63.0 in | Wt 174.8 lb

## 2022-08-07 DIAGNOSIS — E785 Hyperlipidemia, unspecified: Secondary | ICD-10-CM

## 2022-08-07 DIAGNOSIS — I10 Essential (primary) hypertension: Secondary | ICD-10-CM

## 2022-08-07 DIAGNOSIS — E1169 Type 2 diabetes mellitus with other specified complication: Secondary | ICD-10-CM | POA: Diagnosis not present

## 2022-08-07 DIAGNOSIS — E756 Lipid storage disorder, unspecified: Secondary | ICD-10-CM

## 2022-08-07 DIAGNOSIS — R490 Dysphonia: Secondary | ICD-10-CM | POA: Diagnosis not present

## 2022-08-07 DIAGNOSIS — E1165 Type 2 diabetes mellitus with hyperglycemia: Secondary | ICD-10-CM | POA: Diagnosis not present

## 2022-08-07 LAB — BAYER DCA HB A1C WAIVED: HB A1C (BAYER DCA - WAIVED): 8.7 % — ABNORMAL HIGH (ref 4.8–5.6)

## 2022-08-07 MED ORDER — FAMOTIDINE 20 MG PO TABS: 20.0000 mg | ORAL_TABLET | Freq: Every day | ORAL | 3 refills | Status: DC

## 2022-08-07 MED ORDER — TIRZEPATIDE 7.5 MG/0.5ML ~~LOC~~ SOAJ: 7.5000 mg | SUBCUTANEOUS | 2 refills | Status: DC

## 2022-08-07 MED ORDER — METFORMIN HCL ER 750 MG PO TB24: 750.0000 mg | ORAL_TABLET | Freq: Two times a day (BID) | ORAL | 5 refills | Status: DC

## 2022-08-07 MED ORDER — OXYBUTYNIN CHLORIDE ER 5 MG PO TB24: 5.0000 mg | ORAL_TABLET | Freq: Every day | ORAL | 3 refills | Status: DC

## 2022-08-07 NOTE — Progress Notes (Signed)
Subjective:  Patient ID: Yvonne [name], female    DOB: Jun 11, 1951  Age: 71 y.o. MRN: [id number]  CC: Medical Management of Chronic Issues   HPI NESSIE NONG presents forFollow-up of diabetes. Patient checks blood sugar at home.   100s fasting and 200s postprandial Patient denies symptoms such as polyuria, polydipsia, excessive hunger, nausea No significant hypoglycemic spells noted. Medications reviewed. Pt reports taking them regularly without complication/adverse reaction being reported today.     presents for  follow-up of hypertension. Patient has no history of headache chest pain or shortness of breath or recent cough. Patient also denies symptoms of TIA such as focal numbness or weakness. Patient denies side effects from medication. States taking it regularly.   in for follow-up of elevated cholesterol. Doing well without complaints on current medication. Denies side effects of statin including myalgia and arthralgia and nausea. Currently no chest pain, shortness of breath or other cardiovascular related symptoms noted.  Neding new referral to ENT for hoarseness since her provider is going to peds only.   History Leeya has a past medical history of (HFpEF) heart failure with preserved ejection fraction (HCC), Anxiety, Aortic atherosclerosis (HCC), Arthritis, Asthma, CAD (coronary artery disease), Carotid artery disease (HCC), Chronic cough, Chronic otitis media (12/2017), Full dentures, GERD (gastroesophageal reflux disease), History of stroke (09/2017), Hyperlipidemia, Hypertension, Non-insulin dependent type 2 diabetes mellitus (HCC), Orthostatic hypotension, Overactive bladder, PSVT (paroxysmal supraventricular tachycardia) (HCC), Recurrent acute suppurative otitis media without spontaneous rupture of left tympanic membrane (02/19/2018), Stroke (HCC), and Transient cerebral ischemia.   She has a past surgical history that includes Cholecystectomy; Colonoscopy (N/A, 08/17/2015);  Abdominal hysterectomy; Lacrimal duct exploration (Bilateral); Cataract extraction w/ intraocular lens implant (Right); Vitrectomy (Left, 09/14/2015); Gas insertion (Right); and Myringotomy with tube placement (Bilateral, 01/28/2018).   Her family history includes Arthritis in her brother and mother; Arthritis-Osteo in her son; Asthma in her father; Breast cancer in her mother; CVA in her mother; Cancer in her brother; Diabetes in her father and mother; Heart disease in her father; Hepatitis C in her sister; Peripheral Artery Disease in her daughter; Stroke in her mother.She reports that she has never smoked. She has never used smokeless tobacco. She reports that she does not currently use alcohol. She reports that she does not use drugs.  Current Outpatient Medications on File Prior to Visit  Medication Sig Dispense Refill   ACCU-CHEK GUIDE test strip TEST BLOOD SUGAR 4 TIMES DAILY DX E11.69 400 strip 3   Accu-Chek Softclix Lancets lancets Test BS up to 4 times daily Dx E11.69 400 each 3   albuterol (VENTOLIN HFA) 108 (90 Base) MCG/ACT inhaler TAKE 2 PUFFS BY MOUTH EVERY 6 HOURS AS NEEDED FOR WHEEZE OR SHORTNESS OF BREATH 54 each 0   alendronate (FOSAMAX) 70 MG tablet TAKE 1 TABLET EVERY 7 DAYS WITH A FULL GLASS OF WATER ON AN EMPTY STOMACH 12 tablet 1   ALPRAZolam (XANAX) 1 MG tablet ! Each morning, one each evening, and 1/2 each afternoon 75 tablet 5   amLODipine (NORVASC) 10 MG tablet TAKE 1 TABLET BY MOUTH EVERY DAY 90 tablet 3   aspirin 81 MG chewable tablet Chew 1 tablet (81 mg total) by mouth daily. X 90 days 90 tablet 0   atorvastatin (LIPITOR) 80 MG tablet Take 1 tablet (80 mg total) by mouth daily. 90 tablet 2   Bempedoic Acid-Ezetimibe (NEXLIZET) 180-10 MG TABS Take 180 mg by mouth daily. 30 tablet 6   Blood Glucose Monitoring Suppl (ACCU-CHEK  GUIDE) w/Device KIT Test Bs QID Dx E11.69 1 kit 0   clopidogrel (PLAVIX) 75 MG tablet TAKE 1 TABLET BY MOUTH EVERY DAY WITH BREAKFAST 90 tablet 3    dapagliflozin propanediol (FARXIGA) 10 MG TABS tablet Take 1 tablet (10 mg total) by mouth daily. 90 tablet 4   escitalopram (LEXAPRO) 10 MG tablet Take 1 tablet (10 mg total) by mouth daily. 90 tablet 3   furosemide (LASIX) 20 MG tablet TAKE 1 TABLET BY MOUTH EVERY DAY 90 tablet 1   glipiZIDE (GLUCOTROL) 5 MG tablet Take 1 tablet (5 mg total) by mouth daily before breakfast. 90 tablet 3   loratadine (CLARITIN) 10 MG tablet Take 1 tablet (10 mg total) by mouth daily. 30 tablet 11   nitroGLYCERIN (NITROSTAT) 0.4 MG SL tablet Place 1 tablet (0.4 mg total) under the tongue every 5 (five) minutes as needed for chest pain. 25 tablet 10   triamcinolone ointment (KENALOG) 0.5 % Apply 1 application. topically 2 (two) times daily. 30 g 0   vitamin B-12 (CYANOCOBALAMIN) 500 MCG tablet Take 1 tablet (500 mcg total) by mouth daily.     No current facility-administered medications on file prior to visit.    ROS Review of Systems  Constitutional: Negative.   HENT: Negative.    Eyes:  Negative for visual disturbance.  Respiratory:  Negative for shortness of breath.   Cardiovascular:  Negative for chest pain.  Gastrointestinal:  Negative for abdominal pain.  Musculoskeletal:  Negative for arthralgias.    Objective:  BP 136/75   Pulse 73   Temp (!) 97.5 F (36.4 C)   Ht 5\' 3"  (1.6 m)   Wt 174 lb 12.8 oz (79.3 kg)   SpO2 95%   BMI 30.96 kg/m   BP Readings from Last 3 Encounters:  08/07/22 136/75  07/20/22 120/64  06/15/22 (!) 145/80    Wt Readings from Last 3 Encounters:  08/07/22 174 lb 12.8 oz (79.3 kg)  07/20/22 174 lb 4 oz (79 kg)  06/15/22 176 lb 3.2 oz (79.9 kg)     Physical Exam Constitutional:      General: She is not in acute distress.    Appearance: She is well-developed.  Cardiovascular:     Rate and Rhythm: Normal rate and regular rhythm.  Pulmonary:     Breath sounds: Normal breath sounds.  Musculoskeletal:        General: Normal range of motion.  Skin:     General: Skin is warm and dry.  Neurological:     Mental Status: She is alert and oriented to person, place, and time.       Assessment & Plan:   Jadis was seen today for medical management of chronic issues.  Diagnoses and all orders for this visit:  Uncontrolled type 2 diabetes mellitus with hyperglycemia, without long-term current use of insulin (HCC) -     Bayer DCA Hb A1c Waived  Primary hypertension -     CBC with Differential/Platelet -     CMP14+EGFR  Hyperlipidemia with target LDL less than 70 -     Lipid panel  Diabetic lipidosis (HCC) -     metFORMIN (GLUCOPHAGE-XR) 750 MG 24 hr tablet; Take 1 tablet (750 mg total) by mouth 2 (two) times daily. With breakfast and supper  Hoarseness -     Ambulatory referral to ENT  Other orders -     tirzepatide [facility]) 7.5 MG/0.5ML Pen; Inject 7.5 mg into the skin once a week. -  oxybutynin (DITROPAN-XL) 5 MG 24 hr tablet; Take 1 tablet (5 mg total) by mouth at bedtime. -     famotidine (PEPCID) 20 MG tablet; Take 1 tablet (20 mg total) by mouth at bedtime.      I have discontinued [name] Dike's Ozempic (2 MG/DOSE), diclofenac Sodium, and bacitracin-polymyxin b. I have also changed her metFORMIN, oxybutynin, and famotidine. Additionally, I am having her start on tirzepatide. Lastly, I am having her maintain her Accu-Chek Softclix Lancets, nitroGLYCERIN, Accu-Chek Guide, amLODipine, glipiZIDE, clopidogrel, dapagliflozin propanediol, cyanocobalamin, aspirin, Nexlizet, loratadine, albuterol, Accu-Chek Guide, triamcinolone ointment, ALPRAZolam, escitalopram, furosemide, alendronate, and atorvastatin.  Meds ordered this encounter  Medications   tirzepatide (MOUNJARO) 7.5 MG/0.5ML Pen    Sig: Inject 7.5 mg into the skin once a week.    Dispense:  6 mL    Refill:  2   metFORMIN (GLUCOPHAGE-XR) 750 MG 24 hr tablet    Sig: Take 1 tablet (750 mg total) by mouth 2 (two) times daily. With breakfast and supper    Dispense:   60 tablet    Refill:  5   oxybutynin (DITROPAN-XL) 5 MG 24 hr tablet    Sig: Take 1 tablet (5 mg total) by mouth at bedtime.    Dispense:  90 tablet    Refill:  3   famotidine (PEPCID) 20 MG tablet    Sig: Take 1 tablet (20 mg total) by mouth at bedtime.    Dispense:  90 tablet    Refill:  3     Follow-up: Return in about 1 month (around 09/06/2022).  [name], M.D.

## 2022-08-08 LAB — CBC WITH DIFFERENTIAL/PLATELET
Basophils Absolute: 0 10*3/uL (ref 0.0–0.2)
Basos: 1 %
EOS (ABSOLUTE): 0.1 10*3/uL (ref 0.0–0.4)
Eos: 1 %
Hematocrit: 42.7 % (ref 34.0–46.6)
Hemoglobin: 14.4 g/dL (ref 11.1–15.9)
Immature Grans (Abs): 0 10*3/uL (ref 0.0–0.1)
Immature Granulocytes: 0 %
Lymphocytes Absolute: 2.2 10*3/uL (ref 0.7–3.1)
Lymphs: 43 %
MCH: 29.7 pg (ref 26.6–33.0)
MCHC: 33.7 g/dL (ref 31.5–35.7)
MCV: 88 fL (ref 79–97)
Monocytes Absolute: 0.4 10*3/uL (ref 0.1–0.9)
Monocytes: 8 %
Neutrophils Absolute: 2.4 10*3/uL (ref 1.4–7.0)
Neutrophils: 47 %
Platelets: 257 10*3/uL (ref 150–450)
RBC: 4.85 x10E6/uL (ref 3.77–5.28)
RDW: 12.8 % (ref 11.7–15.4)
WBC: 5.2 10*3/uL (ref 3.4–10.8)

## 2022-08-08 LAB — LIPID PANEL
Chol/HDL Ratio: 1.8 ratio (ref 0.0–4.4)
Cholesterol, Total: 99 mg/dL — ABNORMAL LOW (ref 100–199)
HDL: 55 mg/dL (ref >39)
LDL Chol Calc (NIH): 28 mg/dL (ref 0–99)
Triglycerides: 79 mg/dL (ref 0–149)
VLDL Cholesterol Cal: 16 mg/dL (ref 5–40)

## 2022-08-08 LAB — CMP14+EGFR
ALT: 13 IU/L (ref 0–32)
AST: 19 IU/L (ref 0–40)
Albumin/Globulin Ratio: 1.9 (ref 1.2–2.2)
Albumin: 4.4 g/dL (ref 3.8–4.8)
Alkaline Phosphatase: 94 IU/L (ref 44–121)
BUN/Creatinine Ratio: 21 (ref 12–28)
BUN: 22 mg/dL (ref 8–27)
Bilirubin Total: 0.5 mg/dL (ref 0.0–1.2)
CO2: 24 mmol/L (ref 20–29)
Calcium: 10 mg/dL (ref 8.7–10.3)
Chloride: 107 mmol/L — ABNORMAL HIGH (ref 96–106)
Creatinine, Ser: 1.06 mg/dL — ABNORMAL HIGH (ref 0.57–1.00)
Globulin, Total: 2.3 g/dL (ref 1.5–4.5)
Glucose: 161 mg/dL — ABNORMAL HIGH (ref 70–99)
Potassium: 4.4 mmol/L (ref 3.5–5.2)
Sodium: 144 mmol/L (ref 134–144)
Total Protein: 6.7 g/dL (ref 6.0–8.5)
eGFR: 56 mL/min/{1.73_m2} — ABNORMAL LOW (ref >59)

## 2022-08-08 NOTE — Progress Notes (Signed)
Hello Yvonne [name],  Your lab result is normal and/or stable.Some minor variations that are not significant are commonly marked abnormal, but do not represent any medical problem for you.  Best regards, [name], M.D.

## 2022-08-10 ENCOUNTER — Ambulatory Visit (INDEPENDENT_AMBULATORY_CARE_PROVIDER_SITE_OTHER): Payer: Medicare HMO | Admitting: Pharmacist

## 2022-08-10 DIAGNOSIS — E785 Hyperlipidemia, unspecified: Secondary | ICD-10-CM

## 2022-08-10 DIAGNOSIS — E1165 Type 2 diabetes mellitus with hyperglycemia: Secondary | ICD-10-CM

## 2022-08-15 ENCOUNTER — Encounter: Payer: Self-pay | Admitting: Family Medicine

## 2022-08-15 ENCOUNTER — Ambulatory Visit (INDEPENDENT_AMBULATORY_CARE_PROVIDER_SITE_OTHER): Payer: Medicare HMO

## 2022-08-15 ENCOUNTER — Ambulatory Visit (INDEPENDENT_AMBULATORY_CARE_PROVIDER_SITE_OTHER): Payer: Medicare HMO | Admitting: Family Medicine

## 2022-08-15 VITALS — BP 138/76 | HR 70 | Temp 98.0°F | Ht 63.0 in | Wt 174.0 lb

## 2022-08-15 DIAGNOSIS — M25552 Pain in left hip: Secondary | ICD-10-CM

## 2022-08-15 DIAGNOSIS — Y92009 Unspecified place in unspecified non-institutional (private) residence as the place of occurrence of the external cause: Secondary | ICD-10-CM | POA: Diagnosis not present

## 2022-08-15 DIAGNOSIS — W19XXXA Unspecified fall, initial encounter: Secondary | ICD-10-CM

## 2022-08-15 DIAGNOSIS — M545 Low back pain, unspecified: Secondary | ICD-10-CM

## 2022-08-15 DIAGNOSIS — Z043 Encounter for examination and observation following other accident: Secondary | ICD-10-CM | POA: Diagnosis not present

## 2022-08-15 MED ORDER — ACETAMINOPHEN ER 650 MG PO TBCR: 650.0000 mg | EXTENDED_RELEASE_TABLET | Freq: Three times a day (TID) | ORAL | 0 refills | Status: DC | PRN

## 2022-08-15 MED ORDER — DICLOFENAC SODIUM 1 % EX GEL: 4.0000 g | Freq: Four times a day (QID) | CUTANEOUS | 0 refills | Status: DC

## 2022-08-15 NOTE — Progress Notes (Signed)
Subjective:  Patient ID: Yvonne [name], female    DOB: March 08, 1951, 71 y.o.   MRN: [id number]  Patient Care Team: [name], MD as PCP - General (Family Medicine) Pieter Partridge, DO as Consulting Physician (Neurology) Shea Evans [name], [name] as Social Worker (Licensed Clinical Social Worker) Blanca Friend, Royce Macadamia, [facility] (Pharmacist) Werner Lean, MD as Consulting Physician (Cardiology) Harlen Labs, MD as Referring Physician (Optometry)   Chief Complaint:  Fall (Hard wood floor/Left low back and hip)   HPI: Yvonne [name] is a 71 y.o. female presenting on 08/15/2022 for Fall (Hard wood floor/Left low back and hip)   Pt presents today for evaluation of lower back pain and left hip pain after a fall on Saturday. States she fell while walking and landed on hard wood floor. She did not hit head or have a LOC. She now has pain in her lower back and left hip, aching to shooting in nature, moderate, worse with movement and ambulation. She has been taking tylenol with some relief of pain.        Relevant past medical, surgical, family, and social history reviewed and updated as indicated.  Allergies and medications reviewed and updated. Data reviewed: Chart in Epic.   Past Medical History:  Diagnosis Date   (HFpEF) heart failure with preserved ejection fraction (HCC)    Anxiety    Aortic atherosclerosis (HCC)    Arthritis    left hand   Asthma    daily and prn inhalers   CAD (coronary artery disease)    Carotid artery disease (HCC)    Chronic cough    Chronic otitis media 12/2017   Full dentures    GERD (gastroesophageal reflux disease)    History of stroke 09/2017   weakness right hand, numbness right side face   Hyperlipidemia    Hypertension    states under control with meds., has been on med. x a long time, per pt.   Non-insulin dependent type 2 diabetes mellitus (HCC)    Orthostatic hypotension    Overactive bladder    PSVT (paroxysmal supraventricular  tachycardia) (HCC)    Recurrent acute suppurative otitis media without spontaneous rupture of left tympanic membrane 02/19/2018   Stroke ([place])    Transient cerebral ischemia     Past Surgical History:  Procedure Laterality Date   ABDOMINAL HYSTERECTOMY     complete   CATARACT EXTRACTION W/ INTRAOCULAR LENS IMPLANT Right    CHOLECYSTECTOMY     COLONOSCOPY N/A 08/17/2015   Procedure: COLONOSCOPY;  Surgeon: Rogene Houston, MD;  Location: [facility];  Service: Endoscopy;  Laterality: N/A;  200 - moved to 7:30 - Ann notified pt   GAS INSERTION Right    x 2 - eye   LACRIMAL DUCT EXPLORATION Bilateral    removal of tear ducts   MYRINGOTOMY WITH TUBE PLACEMENT Bilateral 01/28/2018   Procedure: BILATERAL MYRINGOTOMY WITH TUBE PLACEMENT;  Surgeon: Leta Baptist, MD;  Location: [facility];  Service: ENT;  Laterality: Bilateral;   VITRECTOMY Left 09/14/2015    Social History   Socioeconomic History   Marital status: Married    Spouse name: Not on file   Number of children: 3   Years of education: Not on file   Highest education level: GED or equivalent  Occupational History   Occupation: retired    Comment: Customer service manager and restaurants  Tobacco Use   Smoking status: Never   Smokeless tobacco: Never  Vaping Use   Vaping Use: Never used  Substance and Sexual Activity   Alcohol use: Not Currently   Drug use: No   Sexual activity: Yes  Other Topics Concern   Not on file  Social History Narrative   Lives at home home with husband with    Son, daughter in law and 3 children.  [name], his girlfriend and her child also moved in 10/2018      Disabled.   Social Determinants of Health   Financial Resource Strain: Medium Risk (03/01/2022)   Overall Financial Resource Strain (CARDIA)    Difficulty of Paying Living Expenses: Somewhat hard  Food Insecurity: No Food Insecurity (03/01/2022)   Hunger Vital Sign    Worried About Running Out of Food in the Last Year: Never  true    Ran Out of Food in the Last Year: Never true  Transportation Needs: No Transportation Needs (03/01/2022)   PRAPARE - Hydrologist (Medical): No    Lack of Transportation (Non-Medical): No  Physical Activity: Sufficiently Active (03/01/2022)   Exercise Vital Sign    Days of Exercise per Week: 5 days    Minutes of Exercise per Session: 30 min  Stress: Stress Concern Present (03/01/2022)   [place]    Feeling of Stress : Rather much  Social Connections: Socially Integrated (03/01/2022)   Social Connection and Isolation Panel [NHANES]    Frequency of Communication with Friends and Family: More than three times a week    Frequency of Social Gatherings with Friends and Family: More than three times a week    Attends Religious Services: 1 to 4 times per year    Active Member of [employer] or Organizations: Yes    Attends Music therapist: More than 4 times per year    Marital Status: Married  Human resources officer Violence: Not At Risk (03/01/2022)   Humiliation, Afraid, Rape, and Kick questionnaire    Fear of Current or Ex-Partner: No    Emotionally Abused: No    Physically Abused: No    Sexually Abused: No    Outpatient Encounter Medications as of 08/15/2022  Medication Sig   ACCU-CHEK GUIDE test strip TEST BLOOD SUGAR 4 TIMES DAILY DX E11.69   Accu-Chek Softclix Lancets lancets Test BS up to 4 times daily Dx E11.69   acetaminophen (TYLENOL 8 HOUR ARTHRITIS PAIN) 650 MG CR tablet Take 1 tablet (650 mg total) by mouth every 8 (eight) hours as needed for pain.   albuterol (VENTOLIN HFA) 108 (90 Base) MCG/ACT inhaler TAKE 2 PUFFS BY MOUTH EVERY 6 HOURS AS NEEDED FOR WHEEZE OR SHORTNESS OF BREATH   alendronate (FOSAMAX) 70 MG tablet TAKE 1 TABLET EVERY 7 DAYS WITH A FULL GLASS OF WATER ON AN EMPTY STOMACH   ALPRAZolam (XANAX) 1 MG tablet ! Each morning, one each evening, and 1/2 each afternoon    amLODipine (NORVASC) 10 MG tablet TAKE 1 TABLET BY MOUTH EVERY DAY   aspirin 81 MG chewable tablet Chew 1 tablet (81 mg total) by mouth daily. X 90 days   atorvastatin (LIPITOR) 80 MG tablet Take 1 tablet (80 mg total) by mouth daily.   Bempedoic Acid-Ezetimibe (NEXLIZET) 180-10 MG TABS Take 180 mg by mouth daily.   Blood Glucose Monitoring Suppl (ACCU-CHEK GUIDE) w/Device KIT Test Bs QID Dx E11.69   clopidogrel (PLAVIX) 75 MG tablet TAKE 1 TABLET BY MOUTH EVERY DAY WITH BREAKFAST   dapagliflozin  propanediol (FARXIGA) 10 MG TABS tablet Take 1 tablet (10 mg total) by mouth daily.   diclofenac Sodium (VOLTAREN) 1 % GEL Apply 4 g topically 4 (four) times daily.   escitalopram (LEXAPRO) 10 MG tablet Take 1 tablet (10 mg total) by mouth daily.   famotidine (PEPCID) 20 MG tablet Take 1 tablet (20 mg total) by mouth at bedtime.   furosemide (LASIX) 20 MG tablet TAKE 1 TABLET BY MOUTH EVERY DAY   glipiZIDE (GLUCOTROL) 5 MG tablet Take 1 tablet (5 mg total) by mouth daily before breakfast.   loratadine (CLARITIN) 10 MG tablet Take 1 tablet (10 mg total) by mouth daily.   metFORMIN (GLUCOPHAGE-XR) 750 MG 24 hr tablet Take 1 tablet (750 mg total) by mouth 2 (two) times daily. With breakfast and supper   nitroGLYCERIN (NITROSTAT) 0.4 MG SL tablet Place 1 tablet (0.4 mg total) under the tongue every 5 (five) minutes as needed for chest pain.   oxybutynin (DITROPAN-XL) 5 MG 24 hr tablet Take 1 tablet (5 mg total) by mouth at bedtime.   tirzepatide (MOUNJARO) 7.5 MG/0.5ML Pen Inject 7.5 mg into the skin once a week.   triamcinolone ointment (KENALOG) 0.5 % Apply 1 application. topically 2 (two) times daily.   vitamin B-12 (CYANOCOBALAMIN) 500 MCG tablet Take 1 tablet (500 mcg total) by mouth daily.   No facility-administered encounter medications on file as of 08/15/2022.    Allergies  Allergen Reactions   Penicillins Hives and Itching    Has patient had a PCN reaction causing immediate rash,  facial/tongue/throat swelling, SOB or lightheadedness with hypotension: Yes Has patient had a PCN reaction causing severe rash involving mucus membranes or skin necrosis: Unk Has patient had a PCN reaction that required hospitalization: No Has patient had a PCN reaction occurring within the last 10 years: No If all of the above answers are "NO", then may proceed with Cephalosporin use.  Has patient had a PCN reaction causing immediate rash, facial/tongue/throat swelling, SOB or lightheadedness with hypotension: Yes Has patient had a PCN reaction causing severe rash involving mucus membranes or skin necrosis: No Has patient had a PCN reaction that required hospitalization No Has patient had a PCN reaction occurring within the last 10 years: No If all of the above answers are "NO", then may proceed with Cephalosporin use. Has patient had a PCN reaction causing immediate rash, facial/tongue/throat swelling, SOB or lightheadedness with hypotension: Yes Has patient had a PCN reaction causing severe rash involving mucus membranes or skin necrosis: Unk Has patient had a PCN reaction that required hospitalization: No Has patient had a PCN reaction occurring within the last 10 years: No If all of the above answers are "NO", then may proceed with Cephalosporin use. Has patient had a PCN reaction causing immediate rash, facial/tongue/throat swelling, SOB or lightheadedness with hypotension: Yes Has patient had a PCN reaction causing severe rash involving mucus membranes or skin necrosis: No Has patient had a PCN reaction that required hospitalization No Has patient had a PCN reaction occurring within the last 10 years: No If all of the above answers are "NO", then may proceed with Cephalosporin use. Has patient had a PCN reaction causing immediate rash, facial/tongue/throat swelling, SOB or lightheadedness with hypotension: Yes Has patient had a PCN reaction causing sev... (TRUNCATED)   Gabapentin Other  (See Comments)    Causes a lot of lethargy at a higher dose Causes a lot of lethargy at a higher dose Causes a lot of lethargy at a  higher dose   Lisinopril Cough    HAS A CHRONIC COUGH AND DID NOT NOTICE IMPROVEMENT OF COUGH WHEN LISINOPRIL WAS STOPPED HAS A CHRONIC COUGH AND DID NOT NOTICE IMPROVEMENT OF COUGH WHEN LISINOPRIL WAS STOPPED HAS A CHRONIC COUGH AND DID NOT NOTICE IMPROVEMENT OF COUGH WHEN LISINOPRIL WAS STOPPED   Soap Rash    DIAL soap    Review of Systems  Constitutional:  Negative for activity change, appetite change, chills, fatigue and fever.  HENT: Negative.    Eyes: Negative.   Respiratory:  Negative for cough, chest tightness and shortness of breath.   Cardiovascular:  Negative for chest pain, palpitations and leg swelling.  Gastrointestinal:  Negative for abdominal pain, blood in stool, constipation, diarrhea, nausea and vomiting.  Endocrine: Negative.   Genitourinary:  Negative for dysuria, frequency and urgency.  Musculoskeletal:  Positive for arthralgias, back pain and gait problem. Negative for joint swelling, myalgias, neck pain and neck stiffness.  Skin: Negative.   Allergic/Immunologic: Negative.   Neurological:  Negative for dizziness, weakness and headaches.  Hematological: Negative.   Psychiatric/Behavioral:  Negative for confusion, hallucinations, sleep disturbance and suicidal ideas.   All other systems reviewed and are negative.       Objective:  BP 138/76   Pulse 70   Temp 98 F (36.7 C)   Ht 5' 3" (1.6 m)   Wt 174 lb (78.9 kg)   SpO2 98%   BMI 30.82 kg/m    Wt Readings from Last 3 Encounters:  08/15/22 174 lb (78.9 kg)  08/07/22 174 lb 12.8 oz (79.3 kg)  07/20/22 174 lb 4 oz (79 kg)    Physical Exam Vitals and nursing note reviewed.  Constitutional:      General: She is not in acute distress.    Appearance: Normal appearance. She is obese. She is not ill-appearing, toxic-appearing or diaphoretic.  HENT:     Head:  Normocephalic and atraumatic.     Mouth/Throat:     Mouth: Mucous membranes are moist.  Eyes:     Pupils: Pupils are equal, round, and reactive to light.  Cardiovascular:     Rate and Rhythm: Normal rate and regular rhythm.     Heart sounds: Normal heart sounds.  Pulmonary:     Effort: Pulmonary effort is normal.     Breath sounds: Normal breath sounds.  Musculoskeletal:     Thoracic back: Normal.     Lumbar back: Tenderness present. No swelling, edema, deformity, signs of trauma, lacerations, spasms or bony tenderness. Decreased range of motion. Negative right straight leg raise test and negative left straight leg raise test. No scoliosis.     Right hip: Normal.     Left hip: Tenderness present. No deformity, lacerations, bony tenderness or crepitus. Decreased range of motion. Normal strength.     Left upper leg: Normal.     Right lower leg: No edema.     Left lower leg: No edema.  Skin:    General: Skin is warm and dry.     Capillary Refill: Capillary refill takes less than 2 seconds.  Neurological:     General: No focal deficit present.     Mental Status: She is alert and oriented to person, place, and time.     Gait: Gait abnormal (antalgic).  Psychiatric:        Mood and Affect: Mood normal.        Behavior: Behavior normal.        Thought Content: Thought content  normal.     Results for orders placed or performed in visit on 08/07/22  Bayer DCA Hb A1c Waived  Result Value Ref Range   HB A1C (BAYER DCA - WAIVED) 8.7 (H) 4.8 - 5.6 %  CBC with Differential/Platelet  Result Value Ref Range   WBC 5.2 3.4 - 10.8 x10E3/uL   RBC 4.85 3.77 - 5.28 x10E6/uL   Hemoglobin 14.4 11.1 - 15.9 g/dL   Hematocrit 42.7 34.0 - 46.6 %   MCV 88 79 - 97 fL   MCH 29.7 26.6 - 33.0 pg   MCHC 33.7 31.5 - 35.7 g/dL   RDW 12.8 11.7 - 15.4 %   Platelets 257 150 - 450 x10E3/uL   Neutrophils 47 Not Estab. %   Lymphs 43 Not Estab. %   Monocytes 8 Not Estab. %   Eos 1 Not Estab. %   Basos 1 Not  Estab. %   Neutrophils Absolute 2.4 1.4 - 7.0 x10E3/uL   Lymphocytes Absolute 2.2 0.7 - 3.1 x10E3/uL   Monocytes Absolute 0.4 0.1 - 0.9 x10E3/uL   EOS (ABSOLUTE) 0.1 0.0 - 0.4 x10E3/uL   Basophils Absolute 0.0 0.0 - 0.2 x10E3/uL   Immature Granulocytes 0 Not Estab. %   Immature Grans (Abs) 0.0 0.0 - 0.1 x10E3/uL  CMP14+EGFR  Result Value Ref Range   Glucose 161 (H) 70 - 99 mg/dL   BUN 22 8 - 27 mg/dL   Creatinine, Ser 1.06 (H) 0.57 - 1.00 mg/dL   eGFR 56 (L) >59 mL/min/1.73   BUN/Creatinine Ratio 21 12 - 28   Sodium 144 134 - 144 mmol/L   Potassium 4.4 3.5 - 5.2 mmol/L   Chloride 107 (H) 96 - 106 mmol/L   CO2 24 20 - 29 mmol/L   Calcium 10.0 8.7 - 10.3 mg/dL   Total Protein 6.7 6.0 - 8.5 g/dL   Albumin 4.4 3.8 - 4.8 g/dL   Globulin, Total 2.3 1.5 - 4.5 g/dL   Albumin/Globulin Ratio 1.9 1.2 - 2.2   Bilirubin Total 0.5 0.0 - 1.2 mg/dL   Alkaline Phosphatase 94 44 - 121 IU/L   AST 19 0 - 40 IU/L   ALT 13 0 - 32 IU/L  Lipid panel  Result Value Ref Range   Cholesterol, Total 99 (L) 100 - 199 mg/dL   Triglycerides 79 0 - 149 mg/dL   HDL 55 >39 mg/dL   VLDL Cholesterol Cal 16 5 - 40 mg/dL   LDL Chol Calc (NIH) 28 0 - 99 mg/dL   Chol/HDL Ratio 1.8 0.0 - 4.4 ratio     X-Ray: lumbar spine, left hip: No acute findings, arthritic changes noted. Preliminary x-ray reading by Monia Pouch, FNP-C, [facility].   Pertinent labs & imaging results that were available during my care of the patient were reviewed by me and considered in my medical decision making.  Assessment & Plan:  Soniya was seen today for fall.  Diagnoses and all orders for this visit:  Fall at home, initial encounter Lumbar back pain Left hip pain Arthritic changes on imaging. No acute findings, will notify pt if radiology reading differs. Due to declined renal function and elevated blood sugars, will treat with Tylenol arthritis and topical Voltaren. Pt aware to report new, worsening, or persistent symptoms. If symptoms  persist, will refer to PT.  -     DG Lumbar Spine Complete -     DG HIP UNILAT W OR W/O PELVIS 2-3 VIEWS LEFT -     acetaminophen (  TYLENOL 8 HOUR ARTHRITIS PAIN) 650 MG CR tablet; Take 1 tablet (650 mg total) by mouth every 8 (eight) hours as needed for pain. -     diclofenac Sodium (VOLTAREN) 1 % GEL; Apply 4 g topically 4 (four) times daily.   Continue all other maintenance medications.  Follow up plan: Return in about 4 weeks (around 09/12/2022), or if symptoms worsen or fail to improve.   Continue healthy lifestyle choices, including diet (rich in fruits, vegetables, and lean proteins, and low in salt and simple carbohydrates) and exercise (at least 30 minutes of moderate physical activity daily).  Educational handout given for OA  The above assessment and management plan was discussed with the patient. The patient verbalized understanding of and has agreed to the management plan. Patient is aware to call the clinic if they develop any new symptoms or if symptoms persist or worsen. Patient is aware when to return to the clinic for a follow-up visit. Patient educated on when it is appropriate to go to the emergency department.   Monia Pouch, FNP-C [facility] [phone]

## 2022-08-20 ENCOUNTER — Encounter (INDEPENDENT_AMBULATORY_CARE_PROVIDER_SITE_OTHER): Payer: Self-pay | Admitting: *Deleted

## 2022-08-20 ENCOUNTER — Telehealth: Payer: Self-pay | Admitting: Family Medicine

## 2022-08-20 ENCOUNTER — Encounter: Payer: Self-pay | Admitting: Family Medicine

## 2022-08-20 ENCOUNTER — Other Ambulatory Visit: Payer: Self-pay | Admitting: Family Medicine

## 2022-08-20 DIAGNOSIS — Z8669 Personal history of other diseases of the nervous system and sense organs: Secondary | ICD-10-CM | POA: Diagnosis not present

## 2022-08-20 DIAGNOSIS — E119 Type 2 diabetes mellitus without complications: Secondary | ICD-10-CM | POA: Diagnosis not present

## 2022-08-20 DIAGNOSIS — H35341 Macular cyst, hole, or pseudohole, right eye: Secondary | ICD-10-CM | POA: Diagnosis not present

## 2022-08-20 DIAGNOSIS — H2512 Age-related nuclear cataract, left eye: Secondary | ICD-10-CM | POA: Diagnosis not present

## 2022-08-20 DIAGNOSIS — Z961 Presence of intraocular lens: Secondary | ICD-10-CM | POA: Diagnosis not present

## 2022-08-20 NOTE — Telephone Encounter (Signed)
Patient aware.

## 2022-08-20 NOTE — Telephone Encounter (Signed)
Pt called to let Dr [name] know that she cannot take the Metformin that was recently prescribed to her because she is having side effects. Says the medicine makes her feel drunk and she also has diarrhea, dizziness, and chills.   Please advise.

## 2022-08-20 NOTE — Telephone Encounter (Signed)
DC the medication. Should be okay without it as long as she is able to get the mounjaro filled

## 2022-08-21 NOTE — Progress Notes (Signed)
Chronic Care Management Pharmacy Note  08/10/2022 Name:  Yvonne [name] MRN:  [id number] DOB:  04/03/1951  Summary: Diabetes: Goal on track: NO. Uncontrolled-8.1-->8.7%; current treatment: OZEMPIC 2MG, FARXIGA 10MG; GLIPIZIDE  PATIENT HAS BEEN without her ozempic due to patient assistance program issues WILL resubmit for novo nordisk/ozempic patient assistance (samples given today) Denies personal and family history of Medullary thyroid cancer ([employer]) CONTINUE FARXIGA Enrolled/approved for farxiga via az&me patient assistance program Escribe to [facility] in [place] Patient received farxiga in the mail GFR 78-->56 WE ARE UNABLE TO GET MOUNJARO DUE TO COST (NO PATIENT ASSISTANCE AVAILABLE) PATIENT TO WORK ON DIET/LIFESTYLE -- IF CONTINUED HYPERGLYCEMIA--MAY HAVE TO ADD INSULIN Current glucose readings: fasting glucose: <180, post prandial glucose: 200s Denies hypoglycemic/hyperglycemic symptoms Discussed meal planning options and Plate method for healthy eating Avoid sugary drinks and desserts Incorporate balanced protein, non starchy veggies, 1 serving of carbohydrate with each meal Increase water intake Increase physical activity as able Current exercise: N/A; ENCOURAGED Recommended RESTART MEDICATIONS; WILL WORK UP TO OZEMPIC 2MG DOSE Assessed patient finances. Patient approved for NOVO [place] RESPECTIVELY--continue to follow  Hyperlipidemia:  Goal on Track (progressing): YES. controlled; current treatment:ZETIA, ATORVASTATIN 80MG;  Medications previously tried: N/A Current dietary patterns: RECOMMENDED HEART HEALTHY Lipid Panel     Component Value Date/Time   CHOL 106 05/03/2022 0827   TRIG 87 05/03/2022 0827   HDL 53 05/03/2022 0827   CHOLHDL 2.0 05/03/2022 0827   CHOLHDL 3.3 01/17/2022 0444   VLDL 11 01/17/2022 0444   LDLCALC 36 05/03/2022 0827   LABVLDL 17 05/03/2022 0827    Patient Goals/Self-Care Activities patient will:  - take  medications as prescribed as evidenced by patient report and record review check glucose DAILY, document, and provide at future appointments collaborate with provider on medication access solutions target a minimum of 150 minutes of moderate intensity exercise weekly engage in dietary modifications by HEALTHY PLATE METHOD, HEART HEALTHY   Subjective: Yvonne [name] is an 71 y.o. year old female who is a primary patient of [name], [name], MD.  The CCM team was consulted for assistance with disease management and care coordination needs.    Engaged with patient face to face for follow up visit in response to provider referral for pharmacy case management and/or care coordination services.   Consent to Services:  The patient was given information about Chronic Care Management services, agreed to services, and gave verbal consent prior to initiation of services.  Please see initial visit note for detailed documentation.   Patient Care Team: [name], MD as PCP - General (Family Medicine) Pieter Partridge, DO as Consulting Physician (Neurology) Shea Evans [name], [name] as Social Worker (Licensed Clinical Social Worker) Blanca Friend, Royce Macadamia, [facility] (Pharmacist) Werner Lean, MD as Consulting Physician (Cardiology) Harlen Labs, MD as Referring Physician (Optometry)  Objective:  Lab Results  Component Value Date   CREATININE 1.06 (H) 08/07/2022   CREATININE 0.85 05/03/2022   CREATININE 1.04 (H) 03/01/2022    Lab Results  Component Value Date   HGBA1C 8.7 (H) 08/07/2022   Last diabetic Eye exam: No results found for: "HMDIABEYEEXA"  Last diabetic Foot exam: No results found for: "HMDIABFOOTEX"      Component Value Date/Time   CHOL 99 (L) 08/07/2022 0912   TRIG 79 08/07/2022 0912   HDL 55 08/07/2022 0912   CHOLHDL 1.8 08/07/2022 0912   CHOLHDL 3.3 01/17/2022 0444   VLDL 11 01/17/2022  0444   LDLCALC 28 08/07/2022 0912       Latest Ref Rng & Units 08/07/2022    9:12  AM 05/03/2022    8:27 AM 01/23/2022    3:24 PM  Hepatic Function  Total Protein 6.0 - 8.5 g/dL 6.7  6.4  7.4   Albumin 3.8 - 4.8 g/dL 4.4  4.1  4.1   AST 0 - 40 IU/L _0 ALT 0 - 32 IU/L 13  37  44   Alk Phosphatase 44 - 121 IU/L 94  91  121   Total Bilirubin 0.0 - 1.2 mg/dL 0.5  0.4  0.6     Lab Results  Component Value Date/Time   TSH 0.766 01/17/2022 04:44 AM   TSH 3.040 06/20/2018 10:11 AM   TSH 1.580 06/03/2015 12:10 PM   FREET4 0.83 01/17/2022 04:44 AM       Latest Ref Rng & Units 08/07/2022    9:12 AM 05/03/2022    8:27 AM 01/23/2022    3:24 PM  CBC  WBC 3.4 - 10.8 x10E3/uL 5.2  5.7  7.1   Hemoglobin 11.1 - 15.9 g/dL 14.4  13.2  13.7   Hematocrit 34.0 - 46.6 % 42.7  39.4  40.4   Platelets 150 - 450 x10E3/uL 257  244  235     Lab Results  Component Value Date/Time   VD25OH 14.9 (L) 05/03/2016 08:07 AM   VD25OH 20.0 (L) 06/03/2015 12:10 PM    Clinical ASCVD: Yes  The ASCVD Risk score (Arnett DK, et al., 2019) failed to calculate for the following reasons:   The patient has a prior MI or stroke diagnosis    Other: (CHADS2VASc if Afib, PHQ9 if depression, MMRC or CAT for COPD, ACT, DEXA)  Social History   Tobacco Use  Smoking Status Never  Smokeless Tobacco Never   BP Readings from Last 3 Encounters:  08/15/22 138/76  08/07/22 136/75  07/20/22 120/64   Pulse Readings from Last 3 Encounters:  08/15/22 70  08/07/22 73  07/20/22 71   Wt Readings from Last 3 Encounters:  08/15/22 174 lb (78.9 kg)  08/07/22 174 lb 12.8 oz (79.3 kg)  07/20/22 174 lb 4 oz (79 kg)    Assessment: Review of patient past medical history, allergies, medications, health status, including review of consultants reports, laboratory and other test data, was performed as part of comprehensive evaluation and provision of chronic care management services.   SDOH:  (Social Determinants of Health) assessments and interventions performed:  SDOH Interventions    Flowsheet Row Office  Visit from 08/07/2022 in [place] Most recent reading at 08/07/2022  9:10 AM Office Visit from 07/20/2022 in [place] Most recent reading at 07/20/2022  1:27 PM Office Visit from 06/06/2022 in [place] Most recent reading at 06/06/2022 11:34 AM Office Visit from 04/02/2022 in [place] Most recent reading at 04/02/2022  9:02 AM Clinical Support from 03/01/2022 in [place] Most recent reading at 03/01/2022 11:24 AM Office Visit from 03/01/2022 in [place] Most recent reading at 03/01/2022  8:39 AM  SDOH Interventions        Food Insecurity Interventions -- -- -- -- Intervention Not Indicated --  Housing Interventions -- -- -- -- Intervention Not Indicated --  Transportation Interventions -- -- -- -- Intervention Not Indicated --  Depression Interventions/Treatment  Currently on Treatment Currently on Treatment Currently on  Treatment Currently on Treatment Currently on Treatment Currently on Treatment  Financial Strain Interventions -- -- -- -- Intervention Not Indicated, Patient Refused  [she feels like she will be able to pay bills for now - utilities are very high] --  Physical Activity Interventions -- -- -- -- Intervention Not Indicated --  Stress Interventions -- -- -- -- Patient Refused, Other (Comment)  [related to her and her daughter's health problems - taking medication and speaking with counselor] --  Social Connections Interventions -- -- -- -- Intervention Not Indicated --       CCM Care Plan  Allergies  Allergen Reactions   Penicillins Hives and Itching    Has patient had a PCN reaction causing immediate rash, facial/tongue/throat swelling, SOB or lightheadedness with hypotension: Yes Has patient had a PCN reaction causing severe rash involving mucus membranes or skin necrosis: Unk Has patient had a PCN reaction that required hospitalization:  No Has patient had a PCN reaction occurring within the last 10 years: No If all of the above answers are "NO", then may proceed with Cephalosporin use.  Has patient had a PCN reaction causing immediate rash, facial/tongue/throat swelling, SOB or lightheadedness with hypotension: Yes Has patient had a PCN reaction causing severe rash involving mucus membranes or skin necrosis: No Has patient had a PCN reaction that required hospitalization No Has patient had a PCN reaction occurring within the last 10 years: No If all of the above answers are "NO", then may proceed with Cephalosporin use. Has patient had a PCN reaction causing immediate rash, facial/tongue/throat swelling, SOB or lightheadedness with hypotension: Yes Has patient had a PCN reaction causing severe rash involving mucus membranes or skin necrosis: Unk Has patient had a PCN reaction that required hospitalization: No Has patient had a PCN reaction occurring within the last 10 years: No If all of the above answers are "NO", then may proceed with Cephalosporin use. Has patient had a PCN reaction causing immediate rash, facial/tongue/throat swelling, SOB or lightheadedness with hypotension: Yes Has patient had a PCN reaction causing severe rash involving mucus membranes or skin necrosis: No Has patient had a PCN reaction that required hospitalization No Has patient had a PCN reaction occurring within the last 10 years: No If all of the above answers are "NO", then may proceed with Cephalosporin use. Has patient had a PCN reaction causing immediate rash, facial/tongue/throat swelling, SOB or lightheadedness with hypotension: Yes Has patient had a PCN reaction causing sev... (TRUNCATED)   Metformin And Related Diarrhea    dizziness   Gabapentin Other (See Comments)    Causes a lot of lethargy at a higher dose Causes a lot of lethargy at a higher dose Causes a lot of lethargy at a higher dose   Lisinopril Cough    HAS A CHRONIC COUGH  AND DID NOT NOTICE IMPROVEMENT OF COUGH WHEN LISINOPRIL WAS STOPPED HAS A CHRONIC COUGH AND DID NOT NOTICE IMPROVEMENT OF COUGH WHEN LISINOPRIL WAS STOPPED HAS A CHRONIC COUGH AND DID NOT NOTICE IMPROVEMENT OF COUGH WHEN LISINOPRIL WAS STOPPED   Soap Rash    DIAL soap    Medications Reviewed Today     Reviewed by Lavera Guise, [facility] (Pharmacist) on 08/21/22 at 1240  Med List Status: <None>   Medication Order Taking? Sig Documenting Provider Last Dose Status Informant  ACCU-CHEK GUIDE test strip [id number]  TEST BLOOD SUGAR 4 TIMES DAILY DX E11.69 [name], MD  Active   Accu-Chek Softclix Lancets lancets [id number]  Test  BS up to 4 times daily Dx E11.69 [name], MD  Active Self, Pharmacy Records  acetaminophen (TYLENOL 8 HOUR ARTHRITIS PAIN) 650 MG CR tablet [id number]  Take 1 tablet (650 mg total) by mouth every 8 (eight) hours as needed for pain. Baruch Gouty, FNP  Active   albuterol (VENTOLIN HFA) 108 (90 Base) MCG/ACT inhaler [id number]  TAKE 2 PUFFS BY MOUTH EVERY 6 HOURS AS NEEDED FOR WHEEZE OR SHORTNESS OF Belenda Cruise, MD  Active   alendronate (FOSAMAX) 70 MG tablet [id number]  TAKE 1 TABLET EVERY 7 DAYS WITH A FULL GLASS OF WATER ON AN EMPTY STOMACH [name], MD  Active   ALPRAZolam Duanne Moron) 1 MG tablet [id number]  ! Each morning, one each evening, and 1/2 each afternoon [name], MD  Active   amLODipine (NORVASC) 10 MG tablet [id number]  TAKE 1 TABLET BY MOUTH EVERY Effie Shy, MD  Active Self, Pharmacy Records  aspirin 81 MG chewable tablet [id number]  Chew 1 tablet (81 mg total) by mouth daily. X 90 days Tat, Shanon Brow, MD  Active   atorvastatin (LIPITOR) 80 MG tablet [id number]  Take 1 tablet (80 mg total) by mouth daily. Werner Lean, MD  Active   Bempedoic Acid-Ezetimibe (NEXLIZET) 180-10 MG TABS [id number]  Take 180 mg by mouth daily. Werner Lean, MD  Active   Blood Glucose Monitoring Suppl (ACCU-CHEK GUIDE) w/Device Drucie Opitz  [id number]  Test Bs QID Dx E11.69 [name], MD  Active Self, Pharmacy Records  clopidogrel (PLAVIX) 75 MG tablet [id number]  TAKE 1 TABLET BY MOUTH EVERY DAY WITH Gara Kroner, MD  Active Self, Pharmacy Records  dapagliflozin propanediol (FARXIGA) 10 MG TABS tablet [id number]  Take 1 tablet (10 mg total) by mouth daily. [name], MD  Active Self, Pharmacy Records           Med Note Parthenia Ames Jan 11, 2022 11:28 AM) Via AZ&me patient assistance program   diclofenac Sodium (VOLTAREN) 1 % GEL [id number]  Apply 4 g topically 4 (four) times daily. Baruch Gouty, FNP  Active   escitalopram (LEXAPRO) 10 MG tablet [id number]  Take 1 tablet (10 mg total) by mouth daily. [name], MD  Active   famotidine (PEPCID) 20 MG tablet [id number]  Take 1 tablet (20 mg total) by mouth at bedtime. [name], MD  Active   furosemide (LASIX) 20 MG tablet [id number]  TAKE 1 TABLET BY MOUTH EVERY DAY [name], [name], MD  Active   glipiZIDE (GLUCOTROL) 5 MG tablet [id number]  Take 1 tablet (5 mg total) by mouth daily before breakfast. [name], MD  Active Self, Pharmacy Records  loratadine (CLARITIN) 10 MG tablet [id number]  Take 1 tablet (10 mg total) by mouth daily. Hassell Done, Mary-Margaret, FNP  Active   nitroGLYCERIN (NITROSTAT) 0.4 MG SL tablet [id number]  Place 1 tablet (0.4 mg total) under the tongue every 5 (five) minutes as needed for chest pain. Werner Lean, MD  Active Self, Pharmacy Records  oxybutynin (DITROPAN-XL) 5 MG 24 hr tablet [id number]  Take 1 tablet (5 mg total) by mouth at bedtime. [name], MD  Active   Semaglutide, 2 MG/DOSE, (OZEMPIC, 2 MG/DOSE,) 8 MG/3ML SOPN [id number] Yes Inject 2 mg into the skin once a week. [provider]  Active            Med Note Blanca Friend, Royce Macadamia   Tue Aug 21, 2022 12:40 PM) Via novo nordisk patient assistance program  triamcinolone ointment (KENALOG) 0.5 % [id number]  Apply 1 application. topically 2 (two)  times daily. Gwenlyn Perking, FNP  Active   vitamin B-12 (CYANOCOBALAMIN) 500 MCG tablet [id number]  Take 1 tablet (500 mcg total) by mouth daily. Orson Eva, MD  Active             Patient Active Problem List   Diagnosis Date Noted   Depression, major, single episode, severe ([place]) 04/10/2022   Peripheral edema 04/10/2022   History of stroke 01/24/2022   Coronary artery disease involving native coronary artery of native heart without angina pectoris 01/24/2022   TIA (transient ischemic attack) 01/18/2022   Uncontrolled type 2 diabetes mellitus with hyperglycemia, without long-term current use of insulin ([place]) 01/17/2022   Sensory disturbance 01/17/2022   Syncope and collapse 01/16/2022   Restless legs 02/27/2021   Insomnia 02/27/2021   Polyneuropathy due to type 2 diabetes mellitus ([place]) 02/27/2021   Palpitations 01/20/2021   Aortic atherosclerosis ([place]) 01/20/2021   Orthostatic hypotension 01/20/2021   Carpal tunnel syndrome 08/23/2020   Diabetic lipidosis ([place]) 09/09/2019   Hypokalemia 12/11/2018   Benzodiazepine dependence, continuous ([place]) 10/22/2018   Chronic migraine without aura without status migrainosus, not intractable 02/19/2018   Chronic diastolic CHF (congestive heart failure) ([place]) 03/21/2017   Chronic bilateral low back pain with bilateral sciatica 02/27/2017   Spondylosis of lumbar region without myelopathy or radiculopathy 08/15/2016   Pain of both hip joints 06/18/2016   Pain syndrome, chronic 06/18/2016   Chronic cough 05/17/2016   HLD (hyperlipidemia) 11/04/2015   Macular hole of right eye 09/14/2015   Obesity 07/20/2015   Osteopenia 07/20/2015   Precordial pain 07/06/2015   Overactive bladder    Hypertension    GAD (generalized anxiety disorder)    Acid reflux disease 08/17/2014    Immunization History  Administered Date(s) Administered   Fluad Quad(high Dose 65+) 08/30/2021   Influenza Split 08/18/2015   Influenza,inj,Quad PF,6+ Mos 10/22/2018    Influenza-Unspecified 09/14/2013   Moderna Sars-Covid-2 Vaccination 06/14/2020, 07/12/2020   PNEUMOCOCCAL CONJUGATE-20 04/17/2022   Pneumococcal Conjugate-13 01/06/2015   Pneumococcal Polysaccharide-23 10/22/2018   Tdap 06/06/2017, 05/22/2021   Zoster Recombinat (Shingrix) 06/06/2017, 02/26/2018    Conditions to be addressed/monitored: CAD, DMII, and CKD Stage 3A  Care Plan : [name] MEDICATION MANAGEMENT  Updates made by Lavera Guise, RPH since 08/21/2022 12:00 AM     Problem: DISEASE PROGRESSION PREVENTION      Long-Range Goal: T2DM, HLD [name] GOAL   Recent Progress: Not on track  Note:   Current Barriers:  Unable to independently afford treatment regimen Unable to maintain control of T2DM, HLD Suboptimal therapeutic regimen for T2DM, HLD  Pharmacist Clinical Goal(s):  patient will verbalize ability to afford treatment regimen maintain control of T2DM, HLD as evidenced by GOAL A1C<7%, LDL<70  adhere to plan to optimize therapeutic regimen for T2DM, HLD as evidenced by report of adherence to recommended medication management changes through collaboration with [name] and provider.   Interventions: 1:1 collaboration with [name], MD regarding development and update of comprehensive plan of care as evidenced by provider attestation and co-signature Inter-disciplinary care team collaboration (see longitudinal plan of care) Comprehensive medication review performed; medication list updated in electronic medical record  Diabetes: Goal on track: NO. Uncontrolled-8.1-->8.7%; current treatment: OZEMPIC 2MG, FARXIGA 10MG; GLIPIZIDE  PATIENT HAS BEEN without her ozempic due to patient assistance program issues WILL resubmit for novo nordisk/ozempic patient assistance (samples given today) Denies personal and family history of Medullary thyroid cancer (  [place]) CONTINUE FARXIGA Enrolled/approved for farxiga via az&me patient assistance program Escribe to [facility] in  [place] Patient received farxiga in the mail GFR 78-->56 WE ARE UNABLE TO GET MOUNJARO DUE TO COST (NO PATIENT ASSISTANCE AVAILABLE) PATIENT TO WORK ON DIET/LIFESTYLE -- IF CONTINUED HYPERGLYCEMIA--MAY HAVE TO ADD INSULIN Current glucose readings: fasting glucose: <180, post prandial glucose: 200s Denies hypoglycemic/hyperglycemic symptoms Discussed meal planning options and Plate method for healthy eating Avoid sugary drinks and desserts Incorporate balanced protein, non starchy veggies, 1 serving of carbohydrate with each meal Increase water intake Increase physical activity as able Current exercise: N/A; ENCOURAGED Recommended RESTART MEDICATIONS; WILL WORK UP TO OZEMPIC 2MG DOSE Assessed patient finances. Patient approved for NOVO [place] RESPECTIVELY--continue to follow  Hyperlipidemia:  Goal on Track (progressing): YES. controlled; current treatment:ZETIA, ATORVASTATIN 80MG;  Medications previously tried: N/A Current dietary patterns: RECOMMENDED HEART HEALTHY Lipid Panel     Component Value Date/Time   CHOL 106 05/03/2022 0827   TRIG 87 05/03/2022 0827   HDL 53 05/03/2022 0827   CHOLHDL 2.0 05/03/2022 0827   CHOLHDL 3.3 01/17/2022 0444   VLDL 11 01/17/2022 0444   LDLCALC 36 05/03/2022 0827   LABVLDL 17 05/03/2022 0827   Patient Goals/Self-Care Activities patient will:  - take medications as prescribed as evidenced by patient report and record review check glucose DAILY, document, and provide at future appointments collaborate with provider on medication access solutions target a minimum of 150 minutes of moderate intensity exercise weekly engage in dietary modifications by [place], HEART HEALTHY      Plan: Telephone follow up appointment with care management team member scheduled for:  3 MONTHS   Regina Eck, [name], BCPS Clinical Pharmacist, [place]  II Phone  [phone]

## 2022-08-23 ENCOUNTER — Telehealth: Payer: Medicare HMO

## 2022-08-23 ENCOUNTER — Telehealth: Payer: Self-pay | Admitting: Pharmacist

## 2022-08-23 DIAGNOSIS — N1831 Chronic kidney disease, stage 3a: Secondary | ICD-10-CM

## 2022-08-23 MED ORDER — DAPAGLIFLOZIN PROPANEDIOL 10 MG PO TABS: 10.0000 mg | ORAL_TABLET | Freq: Every day | ORAL | 5 refills | Status: DC

## 2022-08-23 NOTE — Telephone Encounter (Signed)
Farxiga refills sent to [facility] mail order (az&me patient assistance program)

## 2022-08-24 MED ORDER — TIRZEPATIDE 5 MG/0.5ML ~~LOC~~ SOAJ: 5.0000 mg | SUBCUTANEOUS | 2 refills | Status: DC

## 2022-08-24 NOTE — Addendum Note (Signed)
Addended by: [name] D on: 08/24/2022 02:44 PM   Modules accepted: Orders

## 2022-08-24 NOTE — Telephone Encounter (Signed)
Patient is able to get [facility] via insurance! Will call in '5mg'$  dose for next month

## 2022-08-25 DIAGNOSIS — Z7985 Long-term (current) use of injectable non-insulin antidiabetic drugs: Secondary | ICD-10-CM | POA: Diagnosis not present

## 2022-08-25 DIAGNOSIS — E1165 Type 2 diabetes mellitus with hyperglycemia: Secondary | ICD-10-CM

## 2022-08-25 DIAGNOSIS — E785 Hyperlipidemia, unspecified: Secondary | ICD-10-CM | POA: Diagnosis not present

## 2022-08-27 ENCOUNTER — Ambulatory Visit: Payer: Medicare HMO | Admitting: Family

## 2022-08-28 ENCOUNTER — Other Ambulatory Visit: Payer: Self-pay | Admitting: Family Medicine

## 2022-08-28 DIAGNOSIS — J439 Emphysema, unspecified: Secondary | ICD-10-CM

## 2022-08-31 ENCOUNTER — Telehealth: Payer: Self-pay | Admitting: Family Medicine

## 2022-09-03 NOTE — Telephone Encounter (Signed)
Patient aware.

## 2022-09-04 ENCOUNTER — Telehealth: Payer: Self-pay | Admitting: Family Medicine

## 2022-09-04 NOTE — Telephone Encounter (Signed)
Spoke with patient, appointment scheduled 09/10/22.

## 2022-09-06 ENCOUNTER — Telehealth: Payer: Self-pay

## 2022-09-06 DIAGNOSIS — N1831 Chronic kidney disease, stage 3a: Secondary | ICD-10-CM

## 2022-09-06 DIAGNOSIS — E1165 Type 2 diabetes mellitus with hyperglycemia: Secondary | ICD-10-CM

## 2022-09-06 NOTE — Telephone Encounter (Signed)
Received notification from AZ&ME regarding re-enrollment approval for [facility]. Patient assistance approved from 11/26/22 to 11/26/23.  Phone: [phone]

## 2022-09-10 ENCOUNTER — Encounter: Payer: Self-pay | Admitting: Family Medicine

## 2022-09-10 ENCOUNTER — Ambulatory Visit (INDEPENDENT_AMBULATORY_CARE_PROVIDER_SITE_OTHER): Payer: Medicare HMO | Admitting: Family Medicine

## 2022-09-10 VITALS — BP 135/75 | HR 74 | Temp 97.9°F | Ht 63.0 in | Wt 170.8 lb

## 2022-09-10 DIAGNOSIS — E1122 Type 2 diabetes mellitus with diabetic chronic kidney disease: Secondary | ICD-10-CM | POA: Diagnosis not present

## 2022-09-10 DIAGNOSIS — I10 Essential (primary) hypertension: Secondary | ICD-10-CM | POA: Diagnosis not present

## 2022-09-10 DIAGNOSIS — N1831 Chronic kidney disease, stage 3a: Secondary | ICD-10-CM | POA: Diagnosis not present

## 2022-09-10 MED ORDER — ONDANSETRON 4 MG PO TBDP: 4.0000 mg | ORAL_TABLET | Freq: Four times a day (QID) | ORAL | 1 refills | Status: DC | PRN

## 2022-09-10 NOTE — Progress Notes (Signed)
Subjective:  Patient ID: Yvonne [name], female    DOB: [birth year]  Age: 71 y.o. MRN: [id number]  CC: Medical Management of Chronic Issues   HPI Yvonne [name] presents for constant voming and diarrhea since switching to the mounjaro.      08/15/2022    9:29 AM 08/07/2022    9:10 AM 08/07/2022    9:02 AM  Depression screen PHQ 2/9  Decreased Interest 3 2 0  Down, Depressed, Hopeless 3 3 0  PHQ - 2 Score 6 5 0  Altered sleeping 3 3   Tired, decreased energy 2 2   Change in appetite 3 3   Feeling bad or failure about yourself  3 3   Trouble concentrating 2 2   Moving slowly or fidgety/restless 2 3   Suicidal thoughts 0 0   PHQ-9 Score 21 21   Difficult doing work/chores  Somewhat difficult     History Yvonne [name] has a past medical history of (HFpEF) heart failure with preserved ejection fraction ([place]), Anxiety, Aortic atherosclerosis ([place]), Arthritis, Asthma, CAD (coronary artery disease), Carotid artery disease ([place]), Chronic cough, Chronic otitis media (12/2017), Full dentures, GERD (gastroesophageal reflux disease), History of stroke (09/2017), Hyperlipidemia, Hypertension, Non-insulin dependent type 2 diabetes mellitus ([place]), Orthostatic hypotension, Overactive bladder, PSVT (paroxysmal supraventricular tachycardia), Recurrent acute suppurative otitis media without spontaneous rupture of left tympanic membrane (02/19/2018), Stroke ([place]), and Transient cerebral ischemia.   She has a past surgical history that includes Cholecystectomy; Colonoscopy (N/A, 08/17/2015); Abdominal hysterectomy; Lacrimal duct exploration (Bilateral); Cataract extraction w/ intraocular lens implant (Right); Vitrectomy (Left, 09/14/2015); Gas insertion (Right); and Myringotomy with tube placement (Bilateral, 01/28/2018).   Her family history includes Arthritis in her brother and mother; Arthritis-Osteo in her son; Asthma in her father; Breast cancer in her mother; CVA in her mother; Cancer in her brother; Diabetes in  her father and mother; Heart disease in her father; Hepatitis C in her sister; Peripheral Artery Disease in her daughter; Stroke in her mother.She reports that she has never smoked. She has never used smokeless tobacco. She reports that she does not currently use alcohol. She reports that she does not use drugs.    ROS Review of Systems  Respiratory:  Negative for chest tightness and shortness of breath.   Cardiovascular:  Negative for chest pain.  Gastrointestinal:  Positive for diarrhea and vomiting.    Objective:  BP 135/75   Pulse 74   Temp 97.9 F (36.6 C)   Ht 5' 3" (1.6 m)   Wt 170 lb 12.8 oz (77.5 kg)   SpO2 98%   BMI 30.26 kg/m   BP Readings from Last 3 Encounters:  09/10/22 135/75  08/15/22 138/76  08/07/22 136/75    Wt Readings from Last 3 Encounters:  09/10/22 170 lb 12.8 oz (77.5 kg)  08/15/22 174 lb (78.9 kg)  08/07/22 174 lb 12.8 oz (79.3 kg)     Physical Exam Constitutional:      Appearance: She is ill-appearing.  Cardiovascular:     Rate and Rhythm: Normal rate and regular rhythm.     Heart sounds: No murmur heard. Abdominal:     General: There is no distension.     Palpations: Abdomen is soft.     Tenderness: There is no abdominal tenderness.  Neurological:     Mental Status: She is alert.       Assessment & Plan:   Yvonne [name] was seen today for medical management of chronic issues.  Diagnoses and all  orders for this visit:  Type 2 diabetes mellitus with stage 3a chronic kidney disease, without long-term current use of insulin (HCC) -     Cancel: Bayer DCA Hb A1c Waived -     Cancel: Microalbumin / creatinine urine ratio  Primary hypertension -     Cancel: CBC with Differential/Platelet -     Cancel: CMP14+EGFR  Other orders -     Cancel: Lipid panel -     ondansetron (ZOFRAN-ODT) 4 MG disintegrating tablet; Take 1 tablet (4 mg total) by mouth every 6 (six) hours as needed for nausea or vomiting.       I am having Yvonne [name]  start on ondansetron. I am also having her maintain her Accu-Chek Softclix Lancets, nitroGLYCERIN, Accu-Chek Guide, amLODipine, glipiZIDE, clopidogrel, cyanocobalamin, aspirin, Nexlizet, loratadine, Accu-Chek Guide, triamcinolone ointment, ALPRAZolam, escitalopram, furosemide, alendronate, atorvastatin, oxybutynin, famotidine, acetaminophen, diclofenac Sodium, dapagliflozin propanediol, tirzepatide, and albuterol.  Allergies as of 09/10/2022       Reactions   Penicillins Hives, Itching   Has patient had a PCN reaction causing immediate rash, facial/tongue/throat swelling, SOB or lightheadedness with hypotension: Yes Has patient had a PCN reaction causing severe rash involving mucus membranes or skin necrosis: Unk Has patient had a PCN reaction that required hospitalization: No Has patient had a PCN reaction occurring within the last 10 years: No If all of the above answers are "NO", then may proceed with Cephalosporin use. Has patient had a PCN reaction causing immediate rash, facial/tongue/throat swelling, SOB or lightheadedness with hypotension: Yes Has patient had a PCN reaction causing severe rash involving mucus membranes or skin necrosis: No Has patient had a PCN reaction that required hospitalization No Has patient had a PCN reaction occurring within the last 10 years: No If all of the above answers are "NO", then may proceed with Cephalosporin use. Has patient had a PCN reaction causing immediate rash, facial/tongue/throat swelling, SOB or lightheadedness with hypotension: Yes Has patient had a PCN reaction causing severe rash involving mucus membranes or skin necrosis: Unk Has patient had a PCN reaction that required hospitalization: No Has patient had a PCN reaction occurring within the last 10 years: No If all of the above answers are "NO", then may proceed with Cephalosporin use. Has patient had a PCN reaction causing immediate rash, facial/tongue/throat swelling, SOB or  lightheadedness with hypotension: Yes Has patient had a PCN reaction causing severe rash involving mucus membranes or skin necrosis: No Has patient had a PCN reaction that required hospitalization No Has patient had a PCN reaction occurring within the last 10 years: No If all of the above answers are "NO", then may proceed with Cephalosporin use. Has patient had a PCN reaction causing immediate rash, facial/tongue/throat swelling, SOB or lightheadedness with hypotension: Yes Has patient had a PCN reaction causing sev... (TRUNCATED)   Metformin And Related Diarrhea   dizziness   Gabapentin Other (See Comments)   Causes a lot of lethargy at a higher dose Causes a lot of lethargy at a higher dose Causes a lot of lethargy at a higher dose   Lisinopril Cough   HAS A CHRONIC COUGH AND DID NOT NOTICE IMPROVEMENT OF COUGH WHEN LISINOPRIL WAS STOPPED HAS A CHRONIC COUGH AND DID NOT NOTICE IMPROVEMENT OF COUGH WHEN LISINOPRIL WAS STOPPED HAS A CHRONIC COUGH AND DID NOT NOTICE IMPROVEMENT OF COUGH WHEN LISINOPRIL WAS STOPPED   Soap Rash   DIAL soap        Medication List  Accurate as of September 10, 2022 10:17 AM. If you have any questions, ask your nurse or doctor.          Accu-Chek Guide test strip Generic drug: glucose blood TEST BLOOD SUGAR 4 TIMES DAILY DX E11.69   Accu-Chek Guide w/Device Kit Test Bs QID Dx E11.69   Accu-Chek Softclix Lancets lancets Test BS up to 4 times daily Dx E11.69   acetaminophen 650 MG CR tablet Commonly known as: Tylenol 8 Hour Arthritis Pain Take 1 tablet (650 mg total) by mouth every 8 (eight) hours as needed for pain.   albuterol 108 (90 Base) MCG/ACT inhaler Commonly known as: VENTOLIN HFA TAKE 2 PUFFS BY MOUTH EVERY 6 HOURS AS NEEDED FOR WHEEZE OR SHORTNESS OF BREATH   alendronate 70 MG tablet Commonly known as: FOSAMAX TAKE 1 TABLET EVERY 7 DAYS WITH A FULL GLASS OF WATER ON AN EMPTY STOMACH   ALPRAZolam 1 MG tablet Commonly  known as: XANAX ! Each morning, one each evening, and 1/2 each afternoon   amLODipine 10 MG tablet Commonly known as: NORVASC TAKE 1 TABLET BY MOUTH EVERY DAY   aspirin 81 MG chewable tablet Chew 1 tablet (81 mg total) by mouth daily. X 90 days   atorvastatin 80 MG tablet Commonly known as: LIPITOR Take 1 tablet (80 mg total) by mouth daily.   clopidogrel 75 MG tablet Commonly known as: PLAVIX TAKE 1 TABLET BY MOUTH EVERY DAY WITH BREAKFAST   cyanocobalamin 500 MCG tablet Commonly known as: VITAMIN B12 Take 1 tablet (500 mcg total) by mouth daily.   dapagliflozin propanediol 10 MG Tabs tablet Commonly known as: FARXIGA Take 1 tablet (10 mg total) by mouth daily.   diclofenac Sodium 1 % Gel Commonly known as: Voltaren Apply 4 g topically 4 (four) times daily.   escitalopram 10 MG tablet Commonly known as: Lexapro Take 1 tablet (10 mg total) by mouth daily.   famotidine 20 MG tablet Commonly known as: PEPCID Take 1 tablet (20 mg total) by mouth at bedtime.   furosemide 20 MG tablet Commonly known as: LASIX TAKE 1 TABLET BY MOUTH EVERY DAY   glipiZIDE 5 MG tablet Commonly known as: GLUCOTROL Take 1 tablet (5 mg total) by mouth daily before breakfast.   loratadine 10 MG tablet Commonly known as: CLARITIN Take 1 tablet (10 mg total) by mouth daily.   Nexlizet 180-10 MG Tabs Generic drug: Bempedoic Acid-Ezetimibe Take 180 mg by mouth daily.   nitroGLYCERIN 0.4 MG SL tablet Commonly known as: NITROSTAT Place 1 tablet (0.4 mg total) under the tongue every 5 (five) minutes as needed for chest pain.   ondansetron 4 MG disintegrating tablet Commonly known as: ZOFRAN-ODT Take 1 tablet (4 mg total) by mouth every 6 (six) hours as needed for nausea or vomiting. Started by:  , MD   oxybutynin 5 MG 24 hr tablet Commonly known as: DITROPAN-XL Take 1 tablet (5 mg total) by mouth at bedtime.   tirzepatide 5 MG/0.5ML Pen Commonly known as: MOUNJARO Inject  5 mg into the skin once a week. DX:E11.65   triamcinolone ointment 0.5 % Commonly known as: KENALOG Apply 1 application. topically 2 (two) times daily.       Dc MOUNJARO DUE TO vOMITING & DIARRHEA. Go back to Ozempic samples. .5 X 1 week, 1.0 mg next week, then 2 mg weekly.  Follow-up: Return in about 2 months (around 11/10/2022) for diabetes.   , M.D. 

## 2022-09-13 ENCOUNTER — Telehealth: Payer: Self-pay | Admitting: Family Medicine

## 2022-09-13 NOTE — Telephone Encounter (Signed)
Sorry, didn't show up on my end, but after checking further there is a rare possibility. DC ondansetron

## 2022-09-14 NOTE — Telephone Encounter (Signed)
Lmtcb.

## 2022-09-17 ENCOUNTER — Ambulatory Visit: Payer: Medicare HMO | Admitting: Internal Medicine

## 2022-09-17 NOTE — Telephone Encounter (Signed)
Called patient, no answer, left message to return call 

## 2022-09-18 ENCOUNTER — Encounter: Payer: Self-pay | Admitting: Nurse Practitioner

## 2022-09-18 ENCOUNTER — Ambulatory Visit (INDEPENDENT_AMBULATORY_CARE_PROVIDER_SITE_OTHER): Payer: Medicare HMO | Admitting: Nurse Practitioner

## 2022-09-18 VITALS — BP 135/79 | HR 77 | Temp 98.5°F | Ht 63.0 in | Wt 171.0 lb

## 2022-09-18 DIAGNOSIS — J0110 Acute frontal sinusitis, unspecified: Secondary | ICD-10-CM | POA: Diagnosis not present

## 2022-09-18 DIAGNOSIS — J029 Acute pharyngitis, unspecified: Secondary | ICD-10-CM | POA: Diagnosis not present

## 2022-09-18 LAB — RAPID STREP SCREEN (MED CTR MEBANE ONLY): Strep Gp A Ag, IA W/Reflex: NEGATIVE

## 2022-09-18 LAB — CULTURE, GROUP A STREP

## 2022-09-18 MED ORDER — DOXYCYCLINE HYCLATE 100 MG PO TABS: 100.0000 mg | ORAL_TABLET | Freq: Two times a day (BID) | ORAL | 0 refills | Status: DC

## 2022-09-18 NOTE — Patient Instructions (Signed)

## 2022-09-18 NOTE — Telephone Encounter (Signed)
Called patient, no answer, left message to return call 

## 2022-09-18 NOTE — Progress Notes (Signed)
Acute Office Visit  Subjective:     Patient ID: Yvonne [name], female    DOB: 1951-06-13, 71 y.o.   MRN: [id number]  Chief Complaint  Patient presents with   Sinusitis    Overall  Sinusitis This is a new problem. The current episode started in the past 7 days. The problem is unchanged. The maximum temperature recorded prior to her arrival was 100.4 - 100.9 F. The pain is moderate. Associated symptoms include congestion, headaches, sinus pressure and swollen glands. Pertinent negatives include no coughing or shortness of breath. Past treatments include acetaminophen. The treatment provided no relief.     Review of Systems  Constitutional: Negative.   HENT:  Positive for congestion and sinus pressure.   Respiratory:  Negative for cough and shortness of breath.   Cardiovascular: Negative.   Skin: Negative.   Neurological:  Positive for headaches.  All other systems reviewed and are negative.       Objective:    BP 135/79   Pulse 77   Temp 98.5 F (36.9 C)   Ht '5\' 3"'$  (1.6 m)   Wt 171 lb (77.6 kg)   SpO2 97%   BMI 30.29 kg/m  BP Readings from Last 3 Encounters:  09/18/22 135/79  09/10/22 135/75  08/15/22 138/76      Physical Exam Vitals and nursing note reviewed.  Constitutional:      Appearance: Normal appearance.  HENT:     Head: Normocephalic.     Right Ear: External ear normal.     Left Ear: External ear normal.     Nose: Congestion present.  Eyes:     Conjunctiva/sclera: Conjunctivae normal.     Pupils: Pupils are equal, round, and reactive to light.  Cardiovascular:     Rate and Rhythm: Normal rate and regular rhythm.     Pulses: Normal pulses.     Heart sounds: Normal heart sounds.  Pulmonary:     Effort: Pulmonary effort is normal.     Breath sounds: Normal breath sounds.  Abdominal:     General: Bowel sounds are normal.  Skin:    General: Skin is warm.     Findings: No erythema or rash.  Neurological:     General: No focal deficit  present.     Mental Status: She is alert and oriented to person, place, and time.  Psychiatric:        Behavior: Behavior normal.     No results found for any visits on 09/18/22.      Assessment & Plan:  Patient presents with symptoms of head pressure, facial pressure, pain, chills and fever symptoms in the past 5 to 6 days.  I will treat patient for sinusitis, Advised patient to Take meds as prescribed - Use a cool mist humidifier  -Use saline nose sprays frequently -Force fluids -Doxycycline 100 mg tablet by mouth -Completed COVID-19, RSV, strep and flu test results pending. -For fever or aches or pains- take Tylenol or ibuprofen.  Follow up with worsening unresolved symptoms    Problem List Items Addressed This Visit   None Visit Diagnoses     Subacute frontal sinusitis    -  Primary   Relevant Medications   doxycycline (VIBRA-TABS) 100 MG tablet   Other Relevant Orders   Rapid Strep Screen ([facility] ONLY)   COVID-19, Flu A+B and RSV       Meds ordered this encounter  Medications   doxycycline (VIBRA-TABS) 100 MG tablet  Sig: Take 1 tablet (100 mg total) by mouth 2 (two) times daily.    Dispense:  14 tablet    Refill:  0    Order Specific Question:   Supervising Provider    Answer:   [name] [phone]    Return if symptoms worsen or fail to improve.  Ivy Lynn, NP

## 2022-09-18 NOTE — Telephone Encounter (Signed)
MYCHART MESSAGE SENT

## 2022-09-19 LAB — COVID-19, FLU A+B AND RSV
Influenza A, NAA: NOT DETECTED
Influenza B, NAA: NOT DETECTED
RSV, NAA: NOT DETECTED
SARS-CoV-2, NAA: NOT DETECTED

## 2022-09-20 LAB — HM DIABETES EYE EXAM

## 2022-09-21 ENCOUNTER — Telehealth: Payer: Self-pay | Admitting: Family Medicine

## 2022-09-21 NOTE — Telephone Encounter (Signed)
Sounds good. Can increase the dose if needed.

## 2022-09-24 NOTE — Telephone Encounter (Signed)
CALLED PATIENT, NO ANSWER °

## 2022-09-27 ENCOUNTER — Ambulatory Visit: Payer: Medicare HMO | Admitting: Family Medicine

## 2022-10-04 NOTE — Telephone Encounter (Signed)
Disregard previous note - patient ELIGIBLE for 2024 enrollment.

## 2022-10-12 ENCOUNTER — Ambulatory Visit (INDEPENDENT_AMBULATORY_CARE_PROVIDER_SITE_OTHER): Payer: Medicare HMO | Admitting: Pharmacist

## 2022-10-12 DIAGNOSIS — E782 Mixed hyperlipidemia: Secondary | ICD-10-CM

## 2022-10-12 DIAGNOSIS — E1165 Type 2 diabetes mellitus with hyperglycemia: Secondary | ICD-10-CM

## 2022-10-12 MED ORDER — DAPAGLIFLOZIN PROPANEDIOL 10 MG PO TABS: 10.0000 mg | ORAL_TABLET | Freq: Every day | ORAL | 5 refills | Status: DC

## 2022-10-12 MED ORDER — TIRZEPATIDE 7.5 MG/0.5ML ~~LOC~~ SOAJ: 7.5000 mg | SUBCUTANEOUS | 5 refills | Status: DC

## 2022-10-12 NOTE — Telephone Encounter (Signed)
[place] For farxiga '10mg'$  daily Escribed to [employer] Patient aware paperwork will be mailed

## 2022-10-12 NOTE — Patient Instructions (Addendum)
Visit Information  Following are the goals we discussed today:  Diabetes: Goal on track: NO. Uncontrolled-8.1-->8.7%; current treatment: Mounjaro 7.'5mg'$  sq weekly, FARXIGA '10MG'$ ; GLIPIZIDE  Was on Mounjaro '5mg'$  -->increased to 7.'5mg'$  weekly and tolerating well Denies personal and family history of Medullary thyroid cancer ([facility]) CONTINUE FARXIGA '10mg'$  daily Will re-enrolled--> farxiga [place] Escribe to [employer] in EPIC GFR 78-->56 Current glucose readings: fasting glucose: 115, post prandial glucose: max 173 Denies hypoglycemic/hyperglycemic symptoms Discussed meal planning options and Plate method for healthy eating Avoid sugary drinks and desserts Incorporate balanced protein, non starchy veggies, 1 serving of carbohydrate with each meal Increase water intake Increase physical activity as able Current exercise: N/A; ENCOURAGED Recommended continue to titrate mounjaro as tolerated Assessed patient finances. No assistance for [facility]; will re-enroll for FARXIGA  Hyperlipidemia:  Goal on Track (progressing): YES. controlled; current treatment:ZETIA, ATORVASTATIN '80MG'$ ; added nexlizet per PCP Medications previously tried: N/A Current dietary patterns: RECOMMENDED HEART HEALTHY/mediterranean Lipid Panel     Component Value Date/Time   CHOL 99 (L) 08/07/2022 0912   TRIG 79 08/07/2022 0912   HDL 55 08/07/2022 0912   CHOLHDL 1.8 08/07/2022 0912   CHOLHDL 3.3 01/17/2022 0444   VLDL 11 01/17/2022 0444   LDLCALC 28 08/07/2022 0912   LABVLDL 16 08/07/2022 0912    Patient Goals/Self-Care Activities patient will:  - take medications as prescribed as evidenced by patient report and record review check glucose DAILY, document, and provide at future appointments collaborate with provider on medication access solutions target a minimum of 150 minutes of moderate intensity exercise weekly engage in dietary modifications by [place], HEART  HEALTHY  Plan: Telephone follow up appointment with care management team member scheduled for:  3-6 months  Signature Regina Eck, [name], BCPS Clinical Pharmacist, [place]  [place] Phone [phone]   Please call the care guide team at [phone] if you need to cancel or reschedule your appointment.   The patient verbalized understanding of instructions, educational materials, and care plan provided today and DECLINED offer to receive copy of patient instructions, educational materials, and care plan.

## 2022-10-12 NOTE — Progress Notes (Signed)
Chronic Care Management Pharmacy Note  10/12/2022 Name:  Yvonne [name] MRN:  [id number] DOB:  10/19/1951  Summary: Diabetes: Goal on track: NO. Uncontrolled-8.1-->8.7%; current treatment: Mounjaro 7.68m sq weekly, FARXIGA 10MG; GLIPIZIDE  Was on Mounjaro 542m-->increased to 7.29m24meekly and tolerating well Denies personal and family history of Medullary thyroid cancer (MTC) CONTINUE FARXIGA 23m70mily Will re-enrolled--> farxiga via az&me patient assistance program Escribe to medvantx in EPIC GFR 78-->56 Current glucose readings: fasting glucose: 115, post prandial glucose: max 173 Denies hypoglycemic/hyperglycemic symptoms Discussed meal planning options and Plate method for healthy eating Avoid sugary drinks and desserts Incorporate balanced protein, non starchy veggies, 1 serving of carbohydrate with each meal Increase water intake Increase physical activity as able Current exercise: N/A; ENCOURAGED Recommended continue to titrate mounjaro as tolerated Assessed patient finances. [facility]  Hyperlipidemia:  Goal on Track (progressing): YES. controlled; current treatment:ZETIA, ATORVASTATIN 80MG; added nexlizet per PCP Medications previously tried: N/A Current dietary patterns: RECOMMENDED HEART HEALTHY/mediterranean Lipid Panel     Component Value Date/Time   CHOL 99 (L) 08/07/2022 0912   TRIG 79 08/07/2022 0912   HDL 55 08/07/2022 0912   CHOLHDL 1.8 08/07/2022 0912   CHOLHDL 3.3 01/17/2022 0444   VLDL 11 01/17/2022 0444   LDLCALC 28 08/07/2022 0912   LABVLDL 16 08/07/2022 0912    Patient Goals/Self-Care Activities patient will:  - take medications as prescribed as evidenced by patient report and record review check glucose DAILY, document, and provide at future appointments collaborate with provider on medication access solutions target a minimum of 150 minutes of moderate intensity exercise weekly engage in dietary  modifications by HEALTHY PLATE METHOD, HEART HEALTHY   Subjective: Yvonne [name] 71 y17. year old female who is a primary patient of [name], WarrCletus Gash.  The patient was referred to the Chronic Care Management team for assistance with care management needs subsequent to provider initiation of CCM services and plan of care.    Engaged with patient by telephone for follow up visit in response to provider referral for CCM services.   Objective:  LABS:    Lab Results  Component Value Date   CREATININE 1.06 (H) 08/07/2022   CREATININE 0.85 05/03/2022   CREATININE 1.04 (H) 03/01/2022     Lab Results  Component Value Date   HGBA1C 8.7 (H) 08/07/2022         Component Value Date/Time   CHOL 99 (L) 08/07/2022 0912   TRIG 79 08/07/2022 0912   HDL 55 08/07/2022 0912   CHOLHDL 1.8 08/07/2022 0912   CHOLHDL 3.3 01/17/2022 0444   VLDL 11 01/17/2022 0444   LDLCALC 28 08/07/2022 0912     Clinical ASCVD: Yes   The ASCVD Risk score (Arnett DK, et al., 2019) failed to calculate for the following reasons:   The patient has a prior MI or stroke diagnosis    Other: (CHADS2VASc if Afib, PHQ9 if depression, MMRC or CAT for COPD, ACT, DEXA)    BP Readings from Last 3 Encounters:  09/18/22 135/79  09/10/22 135/75  08/15/22 138/76      SDOH:  (Social Determinants of Health) assessments and interventions performed:    Allergies  Allergen Reactions   Penicillins Hives and Itching    Has patient had a PCN reaction causing immediate rash, facial/tongue/throat swelling, SOB or lightheadedness with hypotension: Yes Has patient had a PCN reaction causing severe rash involving mucus membranes or skin necrosis: Unk  Has patient had a PCN reaction that required hospitalization: No Has patient had a PCN reaction occurring within the last 10 years: No If all of the above answers are "NO", then may proceed with Cephalosporin use.  Has patient had a PCN reaction causing immediate rash,  facial/tongue/throat swelling, SOB or lightheadedness with hypotension: Yes Has patient had a PCN reaction causing severe rash involving mucus membranes or skin necrosis: No Has patient had a PCN reaction that required hospitalization No Has patient had a PCN reaction occurring within the last 10 years: No If all of the above answers are "NO", then may proceed with Cephalosporin use. Has patient had a PCN reaction causing immediate rash, facial/tongue/throat swelling, SOB or lightheadedness with hypotension: Yes Has patient had a PCN reaction causing severe rash involving mucus membranes or skin necrosis: Unk Has patient had a PCN reaction that required hospitalization: No Has patient had a PCN reaction occurring within the last 10 years: No If all of the above answers are "NO", then may proceed with Cephalosporin use. Has patient had a PCN reaction causing immediate rash, facial/tongue/throat swelling, SOB or lightheadedness with hypotension: Yes Has patient had a PCN reaction causing severe rash involving mucus membranes or skin necrosis: No Has patient had a PCN reaction that required hospitalization No Has patient had a PCN reaction occurring within the last 10 years: No If all of the above answers are "NO", then may proceed with Cephalosporin use. Has patient had a PCN reaction causing immediate rash, facial/tongue/throat swelling, SOB or lightheadedness with hypotension: Yes Has patient had a PCN reaction causing sev... (TRUNCATED)   Metformin And Related Diarrhea    dizziness   Gabapentin Other (See Comments)    Causes a lot of lethargy at a higher dose Causes a lot of lethargy at a higher dose Causes a lot of lethargy at a higher dose   Lisinopril Cough    HAS A CHRONIC COUGH AND DID NOT NOTICE IMPROVEMENT OF COUGH WHEN LISINOPRIL WAS STOPPED HAS A CHRONIC COUGH AND DID NOT NOTICE IMPROVEMENT OF COUGH WHEN LISINOPRIL WAS STOPPED HAS A CHRONIC COUGH AND DID NOT NOTICE IMPROVEMENT OF  COUGH WHEN LISINOPRIL WAS STOPPED   Soap Rash    DIAL soap    Medications Reviewed Today     Reviewed by Lavera Guise, [facility] (Pharmacist) on 10/12/22 at 1502  Med List Status: <None>   Medication Order Taking? Sig Documenting Provider Last Dose Status Informant  ACCU-CHEK GUIDE test strip [id number] No TEST BLOOD SUGAR 4 TIMES DAILY DX E11.69 [name], MD Taking Active   Accu-Chek Softclix Lancets lancets [id number] No Test BS up to 4 times daily Dx E11.69 [name], MD Taking Active Self, Pharmacy Records  acetaminophen (TYLENOL 8 HOUR ARTHRITIS PAIN) 650 MG CR tablet [id number] No Take 1 tablet (650 mg total) by mouth every 8 (eight) hours as needed for pain. Baruch Gouty, FNP Taking Active   albuterol (VENTOLIN HFA) 108 (90 Base) MCG/ACT inhaler [id number] No TAKE 2 PUFFS BY MOUTH EVERY 6 HOURS AS NEEDED FOR WHEEZE OR SHORTNESS OF Belenda Cruise, MD Taking Active   alendronate (FOSAMAX) 70 MG tablet [id number] No TAKE 1 TABLET EVERY 7 DAYS WITH A FULL GLASS OF WATER ON AN EMPTY STOMACH [name], MD Taking Active   ALPRAZolam Duanne Moron) 1 MG tablet [id number] No ! Each morning, one each evening, and 1/2 each afternoon [name], MD Taking Active   amLODipine (NORVASC) 10 MG tablet [id number] No TAKE 1 TABLET  BY MOUTH EVERY Effie Shy, MD Taking Active Self, Pharmacy Records  aspirin 81 MG chewable tablet [id number] No Chew 1 tablet (81 mg total) by mouth daily. X 90 days Tat, Shanon Brow, MD Taking Active   atorvastatin (LIPITOR) 80 MG tablet [id number] No Take 1 tablet (80 mg total) by mouth daily. Werner Lean, MD Taking Active   Bempedoic Acid-Ezetimibe (NEXLIZET) 180-10 MG TABS [id number] No Take 180 mg by mouth daily. Werner Lean, MD Taking Active   Blood Glucose Monitoring Suppl (ACCU-CHEK GUIDE) w/Device Drucie Opitz [id number] No Test Bs QID Dx E11.69 [name], MD Taking Active Self, Pharmacy Records  clopidogrel (PLAVIX) 75 MG tablet [id number] No  TAKE 1 TABLET BY MOUTH EVERY DAY WITH Gara Kroner, MD Taking Active Self, Pharmacy Records  dapagliflozin propanediol (FARXIGA) 10 MG TABS tablet [id number]  Take 1 tablet (10 mg total) by mouth daily. [name], MD  Active   diclofenac Sodium (VOLTAREN) 1 % GEL [id number] No Apply 4 g topically 4 (four) times daily. Baruch Gouty, FNP Taking Active   doxycycline (VIBRA-TABS) 100 MG tablet [id number]  Take 1 tablet (100 mg total) by mouth 2 (two) times daily. Ivy Lynn, NP  Active   escitalopram (LEXAPRO) 10 MG tablet [id number] No Take 1 tablet (10 mg total) by mouth daily. [name], MD Taking Active   famotidine (PEPCID) 20 MG tablet [id number] No Take 1 tablet (20 mg total) by mouth at bedtime. [name], MD Taking Active   furosemide (LASIX) 20 MG tablet [id number] No TAKE 1 TABLET BY MOUTH EVERY DAY [name], [name], MD Taking Active   glipiZIDE (GLUCOTROL) 5 MG tablet [id number] No Take 1 tablet (5 mg total) by mouth daily before breakfast. [name], MD Taking Active Self, Pharmacy Records  loratadine (CLARITIN) 10 MG tablet [id number] No Take 1 tablet (10 mg total) by mouth daily. Hassell Done Mary-Margaret, FNP Taking Active   nitroGLYCERIN (NITROSTAT) 0.4 MG SL tablet [id number] No Place 1 tablet (0.4 mg total) under the tongue every 5 (five) minutes as needed for chest pain. Werner Lean, MD Taking Active Self, Pharmacy Records  ondansetron (ZOFRAN-ODT) 4 MG disintegrating tablet [id number] No Take 1 tablet (4 mg total) by mouth every 6 (six) hours as needed for nausea or vomiting. [name], MD Taking Active   oxybutynin (DITROPAN-XL) 5 MG 24 hr tablet [id number] No Take 1 tablet (5 mg total) by mouth at bedtime. [name], MD Taking Active   tirzepatide [facility]) 7.5 MG/0.5ML Pen [id number] Yes Inject 7.5 mg into the skin once a week. GO:T15.72 [name], MD  Active   triamcinolone ointment (KENALOG) 0.5 % [id number] No Apply 1 application.  topically 2 (two) times daily. Gwenlyn Perking, FNP Taking Active   vitamin B-12 (CYANOCOBALAMIN) 500 MCG tablet [id number] No Take 1 tablet (500 mcg total) by mouth daily. Orson Eva, MD Taking Active               Goals Addressed               This Visit's Progress     Patient Stated     T2DM, HLD [name] GOAL (pt-stated)        Current Barriers:  Unable to independently afford treatment regimen Unable to maintain control of T2DM, HLD Suboptimal therapeutic regimen for T2DM, HLD  Pharmacist Clinical Goal(s):  patient will verbalize ability to afford treatment regimen maintain control of T2DM, HLD as evidenced by GOAL A1C<7%, LDL<70  adhere to plan to  optimize therapeutic regimen for T2DM, HLD as evidenced by report of adherence to recommended medication management changes through collaboration with [name] and provider.   Interventions: 1:1 collaboration with [name], MD regarding development and update of comprehensive plan of care as evidenced by provider attestation and co-signature Inter-disciplinary care team collaboration (see longitudinal plan of care) Comprehensive medication review performed; medication list updated in electronic medical record  Diabetes: Goal on track: NO. Uncontrolled-8.1-->8.7%; current treatment: Mounjaro 7.64m sq weekly, FARXIGA 10MG; GLIPIZIDE  Was on Mounjaro 571m-->increased to 7.2m79meekly and tolerating well Denies personal and family history of Medullary thyroid cancer (MTC) CONTINUE FARXIGA 77m49mily Will re-enrolled--> farxiga via az&me patient assistance program Escribe to medvantx in EPIC GFR 78-->56 Current glucose readings: fasting glucose: 115, post prandial glucose: max 173 Denies hypoglycemic/hyperglycemic symptoms Discussed meal planning options and Plate method for healthy eating Avoid sugary drinks and desserts Incorporate balanced protein, non starchy veggies, 1 serving of carbohydrate with each meal Increase  water intake Increase physical activity as able Current exercise: N/A; ENCOURAGED Recommended continue to titrate mounjaro as tolerated Assessed patient finances. [facility]  Hyperlipidemia:  Goal on Track (progressing): YES. controlled; current treatment:ZETIA, ATORVASTATIN 80MG; added nexlizet per PCP Medications previously tried: N/A Current dietary patterns: RECOMMENDED HEART HEALTHY/mediterranean Lipid Panel     Component Value Date/Time   CHOL 99 (L) 08/07/2022 0912   TRIG 79 08/07/2022 0912   HDL 55 08/07/2022 0912   CHOLHDL 1.8 08/07/2022 0912   CHOLHDL 3.3 01/17/2022 0444   VLDL 11 01/17/2022 0444   LDLCALC 28 08/07/2022 0912   LABVLDL 16 08/07/2022 0912     Patient Goals/Self-Care Activities patient will:  - take medications as prescribed as evidenced by patient report and record review check glucose DAILY, document, and provide at future appointments collaborate with provider on medication access solutions target a minimum of 150 minutes of moderate intensity exercise weekly engage in dietary modifications by [place] HEALTHY         Plan: Telephone follow up appointment with care management team member scheduled for:  3-6 months    JuliRegina EckarmD, BCPS Clinical Pharmacist, [place] Phone [phone]

## 2022-10-12 NOTE — Addendum Note (Signed)
Addended by: [name] D on: 10/12/2022 03:02 PM   Modules accepted: Orders

## 2022-10-25 DIAGNOSIS — E785 Hyperlipidemia, unspecified: Secondary | ICD-10-CM

## 2022-10-25 DIAGNOSIS — Z7985 Long-term (current) use of injectable non-insulin antidiabetic drugs: Secondary | ICD-10-CM | POA: Diagnosis not present

## 2022-10-25 DIAGNOSIS — E1165 Type 2 diabetes mellitus with hyperglycemia: Secondary | ICD-10-CM | POA: Diagnosis not present

## 2022-10-25 DIAGNOSIS — D225 Melanocytic nevi of trunk: Secondary | ICD-10-CM | POA: Diagnosis not present

## 2022-10-25 DIAGNOSIS — L720 Epidermal cyst: Secondary | ICD-10-CM | POA: Diagnosis not present

## 2022-11-07 ENCOUNTER — Encounter: Payer: Self-pay | Admitting: Family Medicine

## 2022-11-07 ENCOUNTER — Ambulatory Visit (INDEPENDENT_AMBULATORY_CARE_PROVIDER_SITE_OTHER): Payer: Medicare HMO | Admitting: Family Medicine

## 2022-11-07 VITALS — BP 138/86 | HR 73 | Temp 97.7°F | Ht 63.0 in | Wt 168.0 lb

## 2022-11-07 DIAGNOSIS — E1165 Type 2 diabetes mellitus with hyperglycemia: Secondary | ICD-10-CM

## 2022-11-07 DIAGNOSIS — N9419 Other specified dyspareunia: Secondary | ICD-10-CM

## 2022-11-07 DIAGNOSIS — N1831 Type 2 diabetes mellitus with diabetic chronic kidney disease: Secondary | ICD-10-CM

## 2022-11-07 DIAGNOSIS — E782 Mixed hyperlipidemia: Secondary | ICD-10-CM | POA: Diagnosis not present

## 2022-11-07 DIAGNOSIS — F1320 Sedative, hypnotic or anxiolytic dependence, uncomplicated: Secondary | ICD-10-CM | POA: Diagnosis not present

## 2022-11-07 DIAGNOSIS — E1122 Type 2 diabetes mellitus with diabetic chronic kidney disease: Secondary | ICD-10-CM

## 2022-11-07 DIAGNOSIS — F411 Generalized anxiety disorder: Secondary | ICD-10-CM

## 2022-11-07 DIAGNOSIS — I10 Essential (primary) hypertension: Secondary | ICD-10-CM | POA: Diagnosis not present

## 2022-11-07 DIAGNOSIS — Z79899 Other long term (current) drug therapy: Secondary | ICD-10-CM

## 2022-11-07 LAB — BAYER DCA HB A1C WAIVED: HB A1C (BAYER DCA - WAIVED): 7.5 % — ABNORMAL HIGH (ref 4.8–5.6)

## 2022-11-07 MED ORDER — ALPRAZOLAM 1 MG PO TABS: ORAL_TABLET | ORAL | 5 refills | Status: DC

## 2022-11-07 MED ORDER — PANTOPRAZOLE SODIUM 40 MG PO TBEC: 40.0000 mg | DELAYED_RELEASE_TABLET | Freq: Every day | ORAL | 11 refills | Status: DC

## 2022-11-07 MED ORDER — TIRZEPATIDE 10 MG/0.5ML ~~LOC~~ SOAJ: 10.0000 mg | SUBCUTANEOUS | 2 refills | Status: DC

## 2022-11-07 NOTE — Progress Notes (Signed)
Subjective:  Patient ID: Yvonne [name], female    DOB: 1951-11-09  Age: 71 y.o. MRN: [id number]  CC: Follow-up and Diabetes   HPI Yvonne [name] presents forFollow-up of diabetes. Patient checks blood sugar at home.   130 fasting and 170.  postprandial Patient denies symptoms such as polyuria, polydipsia, excessive hunger, nausea No significant hypoglycemic spells noted. Medications reviewed. Pt reports taking them regularly without complication/adverse reaction being reported today.  Son had double aputation due to blood clots. Pt is a "lunatic." (Self-described)    History Yvonne [name] has a past medical history of (HFpEF) heart failure with preserved ejection fraction ([place]), Anxiety, Aortic atherosclerosis ([place]), Arthritis, Asthma, CAD (coronary artery disease), Carotid artery disease ([place]), Chronic cough, Chronic otitis media (12/2017), Full dentures, GERD (gastroesophageal reflux disease), History of stroke (09/2017), Hyperlipidemia, Hypertension, Non-insulin dependent type 2 diabetes mellitus ([place]), Orthostatic hypotension, Overactive bladder, PSVT (paroxysmal supraventricular tachycardia), Recurrent acute suppurative otitis media without spontaneous rupture of left tympanic membrane (02/19/2018), Stroke ([place]), and Transient cerebral ischemia.   She has a past surgical history that includes Cholecystectomy; Colonoscopy (N/A, 08/17/2015); Abdominal hysterectomy; Lacrimal duct exploration (Bilateral); Cataract extraction w/ intraocular lens implant (Right); Vitrectomy (Left, 09/14/2015); Gas insertion (Right); and Myringotomy with tube placement (Bilateral, 01/28/2018).   Her family history includes Arthritis in her brother and mother; Arthritis-Osteo in her son; Asthma in her father; Breast cancer in her mother; CVA in her mother; Cancer in her brother; Diabetes in her father and mother; Heart disease in her father; Hepatitis C in her sister; Peripheral Artery Disease in her daughter; Stroke in her  mother.She reports that she has never smoked. She has never used smokeless tobacco. She reports that she does not currently use alcohol. She reports that she does not use drugs.  Current Outpatient Medications on File Prior to Visit  Medication Sig Dispense Refill   ACCU-CHEK GUIDE test strip TEST BLOOD SUGAR 4 TIMES DAILY DX E11.69 400 strip 3   Accu-Chek Softclix Lancets lancets Test BS up to 4 times daily Dx E11.69 400 each 3   acetaminophen (TYLENOL 8 HOUR ARTHRITIS PAIN) 650 MG CR tablet Take 1 tablet (650 mg total) by mouth every 8 (eight) hours as needed for pain. 30 tablet 0   albuterol (VENTOLIN HFA) 108 (90 Base) MCG/ACT inhaler TAKE 2 PUFFS BY MOUTH EVERY 6 HOURS AS NEEDED FOR WHEEZE OR SHORTNESS OF BREATH 54 each 1   alendronate (FOSAMAX) 70 MG tablet TAKE 1 TABLET EVERY 7 DAYS WITH A FULL GLASS OF WATER ON AN EMPTY STOMACH 12 tablet 1   amLODipine (NORVASC) 10 MG tablet TAKE 1 TABLET BY MOUTH EVERY DAY 90 tablet 3   aspirin 81 MG chewable tablet Chew 1 tablet (81 mg total) by mouth daily. X 90 days 90 tablet 0   atorvastatin (LIPITOR) 80 MG tablet Take 1 tablet (80 mg total) by mouth daily. 90 tablet 2   Bempedoic Acid-Ezetimibe (NEXLIZET) 180-10 MG TABS Take 180 mg by mouth daily. 30 tablet 6   Blood Glucose Monitoring Suppl (ACCU-CHEK GUIDE) w/Device KIT Test Bs QID Dx E11.69 1 kit 0   clopidogrel (PLAVIX) 75 MG tablet TAKE 1 TABLET BY MOUTH EVERY DAY WITH BREAKFAST 90 tablet 3   dapagliflozin propanediol (FARXIGA) 10 MG TABS tablet Take 1 tablet (10 mg total) by mouth daily. 90 tablet 5   diclofenac Sodium (VOLTAREN) 1 % GEL Apply 4 g topically 4 (four) times daily. 350 g 0   doxycycline (VIBRA-TABS) 100 MG tablet Take  1 tablet (100 mg total) by mouth 2 (two) times daily. 14 tablet 0   escitalopram (LEXAPRO) 10 MG tablet Take 1 tablet (10 mg total) by mouth daily. 90 tablet 3   famotidine (PEPCID) 20 MG tablet Take 1 tablet (20 mg total) by mouth at bedtime. 90 tablet 3    furosemide (LASIX) 20 MG tablet TAKE 1 TABLET BY MOUTH EVERY DAY 90 tablet 1   glipiZIDE (GLUCOTROL) 5 MG tablet Take 1 tablet (5 mg total) by mouth daily before breakfast. 90 tablet 3   loratadine (CLARITIN) 10 MG tablet Take 1 tablet (10 mg total) by mouth daily. 30 tablet 11   nitroGLYCERIN (NITROSTAT) 0.4 MG SL tablet Place 1 tablet (0.4 mg total) under the tongue every 5 (five) minutes as needed for chest pain. 25 tablet 10   ondansetron (ZOFRAN-ODT) 4 MG disintegrating tablet Take 1 tablet (4 mg total) by mouth every 6 (six) hours as needed for nausea or vomiting. 40 tablet 1   oxybutynin (DITROPAN-XL) 5 MG 24 hr tablet Take 1 tablet (5 mg total) by mouth at bedtime. 90 tablet 3   triamcinolone ointment (KENALOG) 0.5 % Apply 1 application. topically 2 (two) times daily. 30 g 0   vitamin B-12 (CYANOCOBALAMIN) 500 MCG tablet Take 1 tablet (500 mcg total) by mouth daily.     No current facility-administered medications on file prior to visit.    ROS Review of Systems  Constitutional: Negative.   HENT: Negative.    Eyes:  Negative for visual disturbance.  Respiratory:  Negative for shortness of breath.   Cardiovascular:  Negative for chest pain.  Gastrointestinal:  Negative for abdominal pain.  Genitourinary:  Positive for dyspareunia.  Musculoskeletal:  Negative for arthralgias.    Objective:  BP 138/86   Pulse 73   Temp 97.7 F (36.5 C)   Ht _0  (1.6 m)   Wt 168 lb (76.2 kg)   SpO2 99%   BMI 29.76 kg/m   BP Readings from Last 3 Encounters:  11/07/22 138/86  09/18/22 135/79  09/10/22 135/75    Wt Readings from Last 3 Encounters:  11/07/22 168 lb (76.2 kg)  09/18/22 171 lb (77.6 kg)  09/10/22 170 lb 12.8 oz (77.5 kg)     Physical Exam Constitutional:      General: She is not in acute distress.    Appearance: She is well-developed.  Cardiovascular:     Rate and Rhythm: Normal rate and regular rhythm.  Pulmonary:     Breath sounds: Normal breath sounds.   Musculoskeletal:        General: Normal range of motion.  Skin:    General: Skin is warm and dry.  Neurological:     Mental Status: She is alert and oriented to person, place, and time.       Assessment & Plan:   Yvonne [name] was seen today for follow-up and diabetes.  Diagnoses and all orders for this visit:  Type 2 diabetes mellitus with stage 3a chronic kidney disease, without long-term current use of insulin (HCC) -     Bayer DCA Hb A1c Waived -     Microalbumin / creatinine urine ratio  Mixed hyperlipidemia -     Lipid panel  Primary hypertension -     CBC with Differential/Platelet -     CMP14+EGFR  Controlled substance agreement signed -     Cancel: ToxASSURE Select 13 (MW), Urine -     Drug Screen 10 W/Conf, Se  Uncontrolled type  2 diabetes mellitus with hyperglycemia, without long-term current use of insulin (HCC)  Dyspareunia due to medical condition in female -     Ambulatory referral to Gynecology  GAD (generalized anxiety disorder) -     ALPRAZolam (XANAX) 1 MG tablet; ! Each morning, one each evening, and 1/2 each afternoon  Benzodiazepine dependence, continuous (HCC) -     ALPRAZolam (XANAX) 1 MG tablet; ! Each morning, one each evening, and 1/2 each afternoon  Other orders -     tirzepatide (MOUNJARO) 10 MG/0.5ML Pen; Inject 10 mg into the skin once a week. -     pantoprazole (PROTONIX) 40 MG tablet; Take 1 tablet (40 mg total) by mouth daily. For stomach      I have discontinued Yvonne [name]'s tirzepatide. I am also having her start on tirzepatide and pantoprazole. Additionally, I am having her maintain her Accu-Chek Softclix Lancets, nitroGLYCERIN, Accu-Chek Guide, amLODipine, glipiZIDE, clopidogrel, cyanocobalamin, aspirin, Nexlizet, loratadine, Accu-Chek Guide, triamcinolone ointment, escitalopram, furosemide, alendronate, atorvastatin, oxybutynin, famotidine, acetaminophen, diclofenac Sodium, albuterol, ondansetron, doxycycline, dapagliflozin  propanediol, and ALPRAZolam.  Meds ordered this encounter  Medications   tirzepatide (MOUNJARO) 10 MG/0.5ML Pen    Sig: Inject 10 mg into the skin once a week.    Dispense:  6 mL    Refill:  2   pantoprazole (PROTONIX) 40 MG tablet    Sig: Take 1 tablet (40 mg total) by mouth daily. For stomach    Dispense:  30 tablet    Refill:  11   ALPRAZolam (XANAX) 1 MG tablet    Sig: ! Each morning, one each evening, and 1/2 each afternoon    Dispense:  75 tablet    Refill:  5     Follow-up: Return in about 3 months (around 02/06/2023).  [name], M.D.

## 2022-11-08 LAB — MICROALBUMIN / CREATININE URINE RATIO
Creatinine, Urine: 306.1 mg/dL
Microalb/Creat Ratio: 6 mg/g creat (ref 0–29)
Microalbumin, Urine: 17.7 ug/mL

## 2022-11-08 LAB — CBC WITH DIFFERENTIAL/PLATELET
Basophils Absolute: 0 10*3/uL (ref 0.0–0.2)
Basos: 1 %
EOS (ABSOLUTE): 0.1 10*3/uL (ref 0.0–0.4)
Eos: 2 %
Hematocrit: 38.9 % (ref 34.0–46.6)
Hemoglobin: 13.6 g/dL (ref 11.1–15.9)
Immature Grans (Abs): 0 10*3/uL (ref 0.0–0.1)
Immature Granulocytes: 0 %
Lymphocytes Absolute: 2.1 10*3/uL (ref 0.7–3.1)
Lymphs: 41 %
MCH: 29.8 pg (ref 26.6–33.0)
MCHC: 35 g/dL (ref 31.5–35.7)
MCV: 85 fL (ref 79–97)
Monocytes Absolute: 0.4 10*3/uL (ref 0.1–0.9)
Monocytes: 7 %
Neutrophils Absolute: 2.5 10*3/uL (ref 1.4–7.0)
Neutrophils: 49 %
Platelets: 237 10*3/uL (ref 150–450)
RBC: 4.57 x10E6/uL (ref 3.77–5.28)
RDW: 12.2 % (ref 11.7–15.4)
WBC: 5.1 10*3/uL (ref 3.4–10.8)

## 2022-11-08 LAB — CMP14+EGFR
ALT: 10 IU/L (ref 0–32)
AST: 16 IU/L (ref 0–40)
Albumin/Globulin Ratio: 1.6 (ref 1.2–2.2)
Albumin: 4 g/dL (ref 3.8–4.8)
Alkaline Phosphatase: 103 IU/L (ref 44–121)
BUN/Creatinine Ratio: 23 (ref 12–28)
BUN: 23 mg/dL (ref 8–27)
Bilirubin Total: 0.4 mg/dL (ref 0.0–1.2)
CO2: 23 mmol/L (ref 20–29)
Calcium: 9.9 mg/dL (ref 8.7–10.3)
Chloride: 104 mmol/L (ref 96–106)
Creatinine, Ser: 1.02 mg/dL — ABNORMAL HIGH (ref 0.57–1.00)
Globulin, Total: 2.5 g/dL (ref 1.5–4.5)
Glucose: 97 mg/dL (ref 70–99)
Potassium: 4.4 mmol/L (ref 3.5–5.2)
Sodium: 141 mmol/L (ref 134–144)
Total Protein: 6.5 g/dL (ref 6.0–8.5)
eGFR: 58 mL/min/{1.73_m2} — ABNORMAL LOW (ref >59)

## 2022-11-08 LAB — LIPID PANEL
Chol/HDL Ratio: 4.4 ratio (ref 0.0–4.4)
Cholesterol, Total: 219 mg/dL — ABNORMAL HIGH (ref 100–199)
HDL: 50 mg/dL (ref >39)
LDL Chol Calc (NIH): 152 mg/dL — ABNORMAL HIGH (ref 0–99)
Triglycerides: 97 mg/dL (ref 0–149)
VLDL Cholesterol Cal: 17 mg/dL (ref 5–40)

## 2022-11-12 LAB — DRUG SCREEN 10 W/CONF, SERUM
Amphetamines, IA: NEGATIVE ng/mL
Barbiturates, IA: NEGATIVE ug/mL
Benzodiazepines, IA: POSITIVE ng/mL — AB
Cocaine & Metabolite, IA: NEGATIVE ng/mL
Methadone, IA: NEGATIVE ng/mL
Opiates, IA: NEGATIVE ng/mL
Oxycodones, IA: NEGATIVE ng/mL
Phencyclidine, IA: NEGATIVE ng/mL
Propoxyphene, IA: NEGATIVE ng/mL
THC(Marijuana) Metabolite, IA: NEGATIVE ng/mL

## 2022-11-12 LAB — BENZODIAZEPINES,MS,WB/SP RFX
7-Aminoclonazepam: NEGATIVE ng/mL
Alprazolam: 25.6 ng/mL
Benzodiazepines Confirm: POSITIVE
Chlordiazepoxide: NEGATIVE
Clonazepam: NEGATIVE ng/mL
Desalkylflurazepam: NEGATIVE ng/mL
Desmethylchlordiazepoxide: NEGATIVE
Desmethyldiazepam: NEGATIVE ng/mL
Diazepam: NEGATIVE ng/mL
Flurazepam: NEGATIVE ng/mL
Lorazepam: NEGATIVE ng/mL
Midazolam: NEGATIVE ng/mL
Oxazepam: NEGATIVE ng/mL
Temazepam: NEGATIVE ng/mL
Triazolam: NEGATIVE ng/mL

## 2022-11-16 ENCOUNTER — Ambulatory Visit (INDEPENDENT_AMBULATORY_CARE_PROVIDER_SITE_OTHER): Payer: Medicare HMO | Admitting: Nurse Practitioner

## 2022-11-16 ENCOUNTER — Encounter: Payer: Self-pay | Admitting: Nurse Practitioner

## 2022-11-16 VITALS — BP 143/82 | HR 79 | Temp 97.7°F | Resp 20 | Ht 63.0 in | Wt 166.0 lb

## 2022-11-16 DIAGNOSIS — M5432 Sciatica, left side: Secondary | ICD-10-CM

## 2022-11-16 MED ORDER — NAPROXEN 500 MG PO TABS: 500.0000 mg | ORAL_TABLET | Freq: Two times a day (BID) | ORAL | 1 refills | Status: DC

## 2022-11-16 MED ORDER — KETOROLAC TROMETHAMINE 60 MG/2ML IM SOLN
60.0000 mg | Freq: Once | INTRAMUSCULAR | Status: AC
  Administered 2022-11-16: 60 mg via INTRAMUSCULAR

## 2022-11-16 NOTE — Patient Instructions (Signed)
Sciatica  Sciatica is pain, weakness, tingling, or loss of feeling (numbness) along the sciatic nerve. The sciatic nerve starts in the lower back and goes down the back of each leg. Sciatica usually affects one side of the body. Sciatica usually goes away on its own or with treatment. Sometimes, sciatica may come back. What are the causes? This condition happens when the sciatic nerve is pinched or has pressure put on it. This may be caused by: A disk in between the bones of the spine bulging out too far (herniated disk). Changes in the spinal disks due to aging. A condition that affects a muscle in the butt. Extra bone growth near the sciatic nerve. A break (fracture) of the area between your hip bones (pelvis). Pregnancy. Tumor. This is rare. What increases the risk? You are more likely to develop this condition if you: Play sports that put pressure or stress on the spine. Have poor strength and ease of movement (flexibility). Have had a back injury or back surgery. Sit for long periods of time. Do activities that involve bending or lifting over and over again. Are very overweight (obese). What are the signs or symptoms? Symptoms can vary from mild to very bad. They may include: Any of these problems in the lower back, leg, hip, or butt: Mild tingling, loss of feeling, or dull aches. A burning feeling. Sharp pains. Loss of feeling in the back of the calf or the sole of the foot. Leg weakness. Very bad back pain that makes it hard to move. These symptoms may get worse when you cough, sneeze, or laugh. They may also get worse when you sit or stand for long periods of time. How is this treated? This condition often gets better without any treatment. However, treatment may include: Changing or cutting back on physical activity when you have pain. Exercising, including strengthening and stretching. Putting ice or heat on the affected area. Shots of medicines to relieve pain and  swelling or to relax your muscles. Surgery. Follow these instructions at home: Medicines Take over-the-counter and prescription medicines only as told by your doctor. Ask your doctor if you should avoid driving or using machines while you are taking your medicine. Managing pain     If told, put ice on the affected area. To do this: Put ice in a plastic bag. Place a towel between your skin and the bag. Leave the ice on for 20 minutes, 2-3 times a day. If your skin turns bright red, take off the ice right away to prevent skin damage. The risk of skin damage is higher if you cannot feel pain, heat, or cold. If told, put heat on the affected area. Do this as often as told by your doctor. Use the heat source that your doctor tells you to use, such as a moist heat pack or a heating pad. Place a towel between your skin and the heat source. Leave the heat on for 20-30 minutes. If your skin turns bright red, take off the heat right away to prevent burns. The risk of burns is higher if you cannot feel pain, heat, or cold. Activity  Return to your normal activities when your doctor says that it is safe. Avoid activities that make your symptoms worse. Take short rests during the day. When you rest for a long time, do some physical activity or stretching between periods of rest. Avoid sitting for a long time without moving. Get up and move around at least one time each   hour. Do exercises and stretches as told by your doctor. Do not lift anything that is heavier than 10 lb (4.5 kg). Avoid lifting heavy things even when you do not have symptoms. Avoid lifting heavy things over and over. When you lift objects, always lift in a way that is safe for your body. To do this, you should: Bend your knees. Keep the object close to your body. Avoid twisting. General instructions Stay at a healthy weight. Wear comfortable shoes that support your feet. Avoid wearing high heels. Avoid sleeping on a mattress  that is too soft or too hard. You might have less pain if you sleep on a mattress that is firm enough to support your back. Contact a doctor if: Your pain is not controlled by medicine. Your pain does not get better. Your pain gets worse. Your pain lasts longer than 4 weeks. You lose weight without trying. Get help right away if: You cannot control when you pee (urinate) or poop (have a bowel movement). You have weakness in any of these areas and it gets worse: Lower back. The area between your hip bones. Butt. Legs. You have redness or swelling of your back. You have a burning feeling when you pee. Summary Sciatica is pain, weakness, tingling, or loss of feeling (numbness) along the sciatic nerve. This may include the lower back, legs, hips, and butt. This condition happens when the sciatic nerve is pinched or has pressure put on it. Treatment often includes rest, exercise, medicines, and putting ice or heat on the affected area. This information is not intended to replace advice given to you by your health care provider. Make sure you discuss any questions you have with your health care provider. Document Revised: 02/19/2022 Document Reviewed: 02/19/2022 Elsevier Patient Education  2023 [employer] Inc[employer]  

## 2022-11-16 NOTE — Progress Notes (Signed)
   Subjective:    Patient ID: Yvonne [name], female    DOB: [birth year], 71 y.o.   MRN: [id number]  Chief Complaint: Pain in left leg   HPI Patient come sin c/o left leg pain. Pain radiates all the way down her leg to her foot. Worse when standing. Deneis any injury. Rates pain 10/10. She has taken ibuprofen. No relief.    Review of Systems  Constitutional:  Negative for diaphoresis.  Eyes:  Negative for pain.  Respiratory:  Negative for shortness of breath.   Cardiovascular:  Negative for chest pain, palpitations and leg swelling.  Gastrointestinal:  Negative for abdominal pain.  Endocrine: Negative for polydipsia.  Skin:  Negative for rash.  Neurological:  Negative for dizziness, weakness and headaches.  Hematological:  Does not bruise/bleed easily.  All other systems reviewed and are negative.      Objective:   Physical Exam Vitals reviewed.  Constitutional:      Appearance: Normal appearance.  Cardiovascular:     Rate and Rhythm: Normal rate.     Heart sounds: Normal heart sounds.  Pulmonary:     Effort: Pulmonary effort is normal.     Breath sounds: Normal breath sounds.  Musculoskeletal:     Comments: (+) SLR on left at 45 degrees  Skin:    General: Skin is warm.  Neurological:     General: No focal deficit present.     Mental Status: She is alert and oriented to person, place, and time.  Psychiatric:        Mood and Affect: Mood normal.        Behavior: Behavior normal.     BP (!) 143/82   Pulse 79   Temp 97.7 F (36.5 C) (Temporal)   Resp 20   Ht '5\' 3"'$  (1.6 m)   Wt 166 lb (75.3 kg)   SpO2 97%   BMI 29.41 kg/m        Assessment & Plan:  [name] in today with chief complaint of Pain in left leg   1. Sciatica of left side Moist heat Rest RTO prn - ketorolac (TORADOL) injection 60 mg  Meds ordered this encounter  Medications   ketorolac (TORADOL) injection 60 mg   naproxen (NAPROSYN) 500 MG tablet    Sig: Take 1 tablet (500 mg  total) by mouth 2 (two) times daily with a meal.    Dispense:  60 tablet    Refill:  1    Order Specific Question:   Supervising Provider    Answer:   Caryl Pina A [[id number]]     The above assessment and management plan was discussed with the patient. The patient verbalized understanding of and has agreed to the management plan. Patient is aware to call the clinic if symptoms persist or worsen. Patient is aware when to return to the clinic for a follow-up visit. Patient educated on when it is appropriate to go to the emergency department.   Mary-Margaret Hassell Done, FNP

## 2022-11-18 ENCOUNTER — Other Ambulatory Visit: Payer: Self-pay | Admitting: Family Medicine

## 2022-11-18 DIAGNOSIS — M7989 Other specified soft tissue disorders: Secondary | ICD-10-CM

## 2022-11-20 DIAGNOSIS — Z20822 Contact with and (suspected) exposure to covid-19: Secondary | ICD-10-CM | POA: Diagnosis not present

## 2022-11-21 DIAGNOSIS — Z20822 Contact with and (suspected) exposure to covid-19: Secondary | ICD-10-CM | POA: Diagnosis not present

## 2022-11-23 ENCOUNTER — Ambulatory Visit: Payer: Medicare HMO | Admitting: Adult Health

## 2022-11-23 ENCOUNTER — Encounter: Payer: Self-pay | Admitting: Adult Health

## 2022-11-23 VITALS — BP 125/81 | HR 81 | Ht 62.0 in | Wt 164.0 lb

## 2022-11-23 DIAGNOSIS — Z90721 Acquired absence of ovaries, unilateral: Secondary | ICD-10-CM

## 2022-11-23 DIAGNOSIS — N393 Stress incontinence (female) (male): Secondary | ICD-10-CM

## 2022-11-23 DIAGNOSIS — Z90710 Acquired absence of both cervix and uterus: Secondary | ICD-10-CM | POA: Diagnosis not present

## 2022-11-23 DIAGNOSIS — N941 Unspecified dyspareunia: Secondary | ICD-10-CM

## 2022-11-23 MED ORDER — PREMARIN 0.625 MG/GM VA CREA: TOPICAL_CREAM | VAGINAL | 0 refills | Status: DC

## 2022-11-23 NOTE — Progress Notes (Signed)
  Subjective:     Patient ID: Yvonne [name], female   DOB: Sep 16, 1951, 71 y.o.   MRN: [id number]  HPI Yvonne [name] is a 71 year old white female,married, sp hysterectomy in complaining of pain with intercourse. She also has urinary incontinence when coughs or sneezes. She says she has sex once a week.   PCP is Dr [name]   Review of Systems +pain with sex +SUI Reviewed past medical,surgical, social and family history. Reviewed medications and allergies.     Objective:   Physical Exam BP 125/81 (BP Location: Left Arm, Patient Position: Sitting, Cuff Size: Normal)   Pulse 81   Ht '5\' 2"'$  (1.575 m)   Wt 164 lb (74.4 kg)   BMI 30.00 kg/m     Skin warm and dry.Pelvic: external genitalia is normal in appearance no lesions, vagina: pale with loss of rugae, has some discomfort with exam, esp near urethra,urethra has no lesions or masses noted, cervix and uterus are absent,  adnexa: no masses or tenderness noted. Bladder is non tender and no masses felt.  AA is 0 Fall risk is low    11/23/2022    8:51 AM 11/16/2022    9:08 AM 11/07/2022   11:07 AM  Depression screen PHQ 2/9  Decreased Interest '3 2 3  '$ Down, Depressed, Hopeless '2 3 2  '$ PHQ - 2 Score '5 5 5  '$ Altered sleeping '3 3 3  '$ Tired, decreased energy '3 2 1  '$ Change in appetite '2 3 3  '$ Feeling bad or failure about yourself  '3 3 3  '$ Trouble concentrating '1 3 2  '$ Moving slowly or fidgety/restless 2 2 0  Suicidal thoughts 0 0 0  PHQ-9 Score '19 21 17  '$ Difficult doing work/chores  Somewhat difficult Somewhat difficult       11/23/2022    8:53 AM 11/16/2022    9:09 AM 11/07/2022   11:14 AM 09/18/2022   11:46 AM  GAD 7 : Generalized Anxiety Score  Nervous, Anxious, on Edge '2 3 3 3  '$ Control/stop worrying '3 3 3 3  '$ Worry too much - different things '3 3 3 3  '$ Trouble relaxing '2 3 3 2  '$ Restless '2 3 2 3  '$ Easily annoyed or irritable '2 2 3 3  '$ Afraid - awful might happen '3 3 3 3  '$ Total GAD 7 Score '17 20 20 20  '$ Anxiety Difficulty  Somewhat difficult  Very difficult Somewhat difficult    Examination chaperoned by Celene Squibb LPN Assessment:     1. Dyspareunia in female Discussed trying vaginal estrogen and she wants to  Gave 12 gm premarin vaginal cream to use 0.5 gm in vagina at bedtime for 1 week daily then 2 x weekly No sex for 2 weeks  Use astroglide with sex  Meds ordered this encounter  Medications   conjugated estrogens (PREMARIN) vaginal cream    Sig: Use 0.5 gm in vagina nightly for 1 week then 2 x weekly    Dispense:  12 g    Refill:  0    Order Specific Question:   Supervising Provider    Answer:   Elonda Husky, LUTHER H [[id number]]    2. SUI (stress urinary incontinence, female) She had bladder tack and has had bladder stretched She is on ditropan too  3. S/P hysterectomy with oophorectomy     Plan:     Follow up with me in 4 weeks for exam and ROS

## 2022-12-02 DIAGNOSIS — R1013 Epigastric pain: Secondary | ICD-10-CM | POA: Diagnosis not present

## 2022-12-02 DIAGNOSIS — Z7984 Long term (current) use of oral hypoglycemic drugs: Secondary | ICD-10-CM | POA: Diagnosis not present

## 2022-12-02 DIAGNOSIS — R0789 Other chest pain: Secondary | ICD-10-CM | POA: Diagnosis not present

## 2022-12-02 DIAGNOSIS — Z79899 Other long term (current) drug therapy: Secondary | ICD-10-CM | POA: Diagnosis not present

## 2022-12-02 DIAGNOSIS — R079 Chest pain, unspecified: Secondary | ICD-10-CM | POA: Diagnosis not present

## 2022-12-02 DIAGNOSIS — I700 Atherosclerosis of aorta: Secondary | ICD-10-CM | POA: Diagnosis not present

## 2022-12-02 DIAGNOSIS — E119 Type 2 diabetes mellitus without complications: Secondary | ICD-10-CM | POA: Diagnosis not present

## 2022-12-02 DIAGNOSIS — R109 Unspecified abdominal pain: Secondary | ICD-10-CM | POA: Diagnosis not present

## 2022-12-02 DIAGNOSIS — K2900 Acute gastritis without bleeding: Secondary | ICD-10-CM | POA: Diagnosis not present

## 2022-12-02 DIAGNOSIS — K219 Gastro-esophageal reflux disease without esophagitis: Secondary | ICD-10-CM | POA: Diagnosis not present

## 2022-12-02 DIAGNOSIS — N3090 Cystitis, unspecified without hematuria: Secondary | ICD-10-CM | POA: Diagnosis not present

## 2022-12-02 DIAGNOSIS — I10 Essential (primary) hypertension: Secondary | ICD-10-CM | POA: Diagnosis not present

## 2022-12-02 DIAGNOSIS — K5730 Diverticulosis of large intestine without perforation or abscess without bleeding: Secondary | ICD-10-CM | POA: Diagnosis not present

## 2022-12-05 DIAGNOSIS — R1010 Upper abdominal pain, unspecified: Secondary | ICD-10-CM | POA: Diagnosis not present

## 2022-12-21 ENCOUNTER — Ambulatory Visit: Payer: Medicare HMO | Admitting: Adult Health

## 2022-12-21 DIAGNOSIS — L539 Erythematous condition, unspecified: Secondary | ICD-10-CM | POA: Diagnosis not present

## 2022-12-21 DIAGNOSIS — K317 Polyp of stomach and duodenum: Secondary | ICD-10-CM | POA: Diagnosis not present

## 2022-12-21 DIAGNOSIS — E785 Hyperlipidemia, unspecified: Secondary | ICD-10-CM | POA: Diagnosis not present

## 2022-12-21 DIAGNOSIS — E119 Type 2 diabetes mellitus without complications: Secondary | ICD-10-CM | POA: Diagnosis not present

## 2022-12-21 DIAGNOSIS — Z794 Long term (current) use of insulin: Secondary | ICD-10-CM | POA: Diagnosis not present

## 2022-12-21 DIAGNOSIS — R609 Edema, unspecified: Secondary | ICD-10-CM | POA: Diagnosis not present

## 2022-12-21 DIAGNOSIS — K635 Polyp of colon: Secondary | ICD-10-CM | POA: Diagnosis not present

## 2022-12-21 DIAGNOSIS — D175 Benign lipomatous neoplasm of intra-abdominal organs: Secondary | ICD-10-CM | POA: Diagnosis not present

## 2022-12-21 DIAGNOSIS — R1084 Generalized abdominal pain: Secondary | ICD-10-CM | POA: Diagnosis not present

## 2022-12-21 DIAGNOSIS — K6289 Other specified diseases of anus and rectum: Secondary | ICD-10-CM | POA: Diagnosis not present

## 2022-12-21 DIAGNOSIS — I700 Atherosclerosis of aorta: Secondary | ICD-10-CM | POA: Diagnosis not present

## 2022-12-21 DIAGNOSIS — K2970 Gastritis, unspecified, without bleeding: Secondary | ICD-10-CM | POA: Diagnosis not present

## 2022-12-21 DIAGNOSIS — K5730 Diverticulosis of large intestine without perforation or abscess without bleeding: Secondary | ICD-10-CM | POA: Diagnosis not present

## 2022-12-21 DIAGNOSIS — K644 Residual hemorrhoidal skin tags: Secondary | ICD-10-CM | POA: Diagnosis not present

## 2022-12-21 DIAGNOSIS — K2950 Unspecified chronic gastritis without bleeding: Secondary | ICD-10-CM | POA: Diagnosis not present

## 2022-12-21 DIAGNOSIS — J42 Unspecified chronic bronchitis: Secondary | ICD-10-CM | POA: Diagnosis not present

## 2022-12-27 ENCOUNTER — Ambulatory Visit: Payer: Medicare HMO | Admitting: Adult Health

## 2022-12-28 ENCOUNTER — Encounter: Payer: Self-pay | Admitting: Nurse Practitioner

## 2022-12-28 ENCOUNTER — Telehealth (INDEPENDENT_AMBULATORY_CARE_PROVIDER_SITE_OTHER): Payer: Medicare HMO | Admitting: Nurse Practitioner

## 2022-12-28 DIAGNOSIS — R519 Headache, unspecified: Secondary | ICD-10-CM

## 2022-12-28 MED ORDER — KETOROLAC TROMETHAMINE 60 MG/2ML IM SOLN
60.0000 mg | Freq: Once | INTRAMUSCULAR | Status: AC
  Administered 2022-12-28: 60 mg via INTRAMUSCULAR

## 2022-12-28 MED ORDER — PROMETHAZINE HCL 50 MG/ML IJ SOLN
25.0000 mg | Freq: Four times a day (QID) | INTRAMUSCULAR | Status: AC | PRN
  Administered 2022-12-28: 25 mg via INTRAMUSCULAR

## 2022-12-28 NOTE — Progress Notes (Signed)
Virtual Visit Consent   Yvonne [name], you are scheduled for a virtual visit with Mary-Margaret Hassell Done, [place], a [facility] provider, today.     Just as with appointments in the office, your consent must be obtained to participate.  Your consent will be active for this visit and any virtual visit you may have with one of our providers in the next 365 days.     If you have a MyChart account, a copy of this consent can be sent to you electronically.  All virtual visits are billed to your insurance company just like a traditional visit in the office.    As this is a virtual visit, video technology does not allow for your provider to perform a traditional examination.  This may limit your provider's ability to fully assess your condition.  If your provider identifies any concerns that need to be evaluated in person or the need to arrange testing (such as labs, EKG, etc.), we will make arrangements to do so.     Although advances in technology are sophisticated, we cannot ensure that it will always work on either your end or our end.  If the connection with a video visit is poor, the visit may have to be switched to a telephone visit.  With either a video or telephone visit, we are not always able to ensure that we have a secure connection.     I need to obtain your verbal consent now.   Are you willing to proceed with your visit today? YES   Yvonne [name] has provided verbal consent on 12/28/2022 for a virtual visit (video or telephone).   Mary-Margaret Hassell Done, FNP   Date: 12/28/2022 10:30 AM   Virtual Visit via Video Note   I, Mary-Margaret Hassell Done, connected with Yvonne [name] ([id number], Sep 08, 1951) on 12/28/22 at  5:00 PM EST by a video-enabled telemedicine application and verified that I am speaking with the correct person using two identifiers.  Location: Patient: Virtual Visit Location Patient: Home Provider: Virtual Visit Location Provider: Mobile   I discussed the limitations of  evaluation and management by telemedicine and the availability of in person appointments. The patient expressed understanding and agreed to proceed.    History of Present Illness: Yvonne [name] is a 72 y.o. who identifies as a female who was assigned female at birth, and is being seen today for headache.  HPI: Headache  This is a new problem. Episode onset: 3 days ago. The problem occurs constantly. The problem has been gradually worsening. The pain is located in the Bilateral region. The pain does not radiate. The pain quality is not similar to prior headaches. The quality of the pain is described as throbbing. The pain is at a severity of 10/10. The pain is severe. Associated symptoms include nausea. Pertinent negatives include no abnormal behavior, dizziness, insomnia, seizures or sinus pressure. The symptoms are aggravated by activity. The treatment provided mild relief.    Had gastroscopy and colonoscopy 2 days ago and bx were taken. She was told not to take nay meds orally that she did not haveto take. \ Review of Systems  HENT:  Negative for sinus pressure.   Gastrointestinal:  Positive for nausea.  Neurological:  Positive for headaches. Negative for dizziness and seizures.  Psychiatric/Behavioral:  The patient does not have insomnia.     Problems:  Patient Active Problem List   Diagnosis Date Noted   S/P hysterectomy with oophorectomy 11/23/2022   SUI (stress urinary  incontinence, female) 11/23/2022   Dyspareunia in female 11/23/2022   Depression, major, single episode, severe ([place]) 04/10/2022   Peripheral edema 04/10/2022   History of stroke 01/24/2022   Coronary artery disease involving native coronary artery of native heart without angina pectoris 01/24/2022   TIA (transient ischemic attack) 01/18/2022   Uncontrolled type 2 diabetes mellitus with hyperglycemia, without long-term current use of insulin ([place]) 01/17/2022   Sensory disturbance 01/17/2022   Syncope and collapse  01/16/2022   Restless legs 02/27/2021   Insomnia 02/27/2021   Polyneuropathy due to type 2 diabetes mellitus ([place]) 02/27/2021   Palpitations 01/20/2021   Aortic atherosclerosis ([place]) 01/20/2021   Orthostatic hypotension 01/20/2021   Carpal tunnel syndrome 08/23/2020   Diabetic lipidosis ([place]) 09/09/2019   Hypokalemia 12/11/2018   Benzodiazepine dependence, continuous ([place]) 10/22/2018   Chronic migraine without aura without status migrainosus, not intractable 02/19/2018   Chronic diastolic CHF (congestive heart failure) ([place]) 03/21/2017   Chronic bilateral low back pain with bilateral sciatica 02/27/2017   Spondylosis of lumbar region without myelopathy or radiculopathy 08/15/2016   Pain of both hip joints 06/18/2016   Pain syndrome, chronic 06/18/2016   Chronic cough 05/17/2016   HLD (hyperlipidemia) 11/04/2015   Macular hole of right eye 09/14/2015   Obesity 07/20/2015   Osteopenia 07/20/2015   Precordial pain 07/06/2015   Overactive bladder    Hypertension    GAD (generalized anxiety disorder)    Acid reflux disease 08/17/2014    Allergies:  Allergies  Allergen Reactions   Penicillins Hives and Itching    Has patient had a PCN reaction causing immediate rash, facial/tongue/throat swelling, SOB or lightheadedness with hypotension: Yes Has patient had a PCN reaction causing severe rash involving mucus membranes or skin necrosis: Unk Has patient had a PCN reaction that required hospitalization: No Has patient had a PCN reaction occurring within the last 10 years: No If all of the above answers are "NO", then may proceed with Cephalosporin use.  Has patient had a PCN reaction causing immediate rash, facial/tongue/throat swelling, SOB or lightheadedness with hypotension: Yes Has patient had a PCN reaction causing severe rash involving mucus membranes or skin necrosis: No Has patient had a PCN reaction that required hospitalization No Has patient had a PCN reaction occurring  within the last 10 years: No If all of the above answers are "NO", then may proceed with Cephalosporin use. Has patient had a PCN reaction causing immediate rash, facial/tongue/throat swelling, SOB or lightheadedness with hypotension: Yes Has patient had a PCN reaction causing severe rash involving mucus membranes or skin necrosis: Unk Has patient had a PCN reaction that required hospitalization: No Has patient had a PCN reaction occurring within the last 10 years: No If all of the above answers are "NO", then may proceed with Cephalosporin use. Has patient had a PCN reaction causing immediate rash, facial/tongue/throat swelling, SOB or lightheadedness with hypotension: Yes Has patient had a PCN reaction causing severe rash involving mucus membranes or skin necrosis: No Has patient had a PCN reaction that required hospitalization No Has patient had a PCN reaction occurring within the last 10 years: No If all of the above answers are "NO", then may proceed with Cephalosporin use. Has patient had a PCN reaction causing immediate rash, facial/tongue/throat swelling, SOB or lightheadedness with hypotension: Yes Has patient had a PCN reaction causing sev... (TRUNCATED)   Metformin And Related Diarrhea    dizziness   Gabapentin Other (See Comments)    Causes a lot of lethargy  at a higher dose Causes a lot of lethargy at a higher dose Causes a lot of lethargy at a higher dose   Lisinopril Cough    HAS A CHRONIC COUGH AND DID NOT NOTICE IMPROVEMENT OF COUGH WHEN LISINOPRIL WAS STOPPED HAS A CHRONIC COUGH AND DID NOT NOTICE IMPROVEMENT OF COUGH WHEN LISINOPRIL WAS STOPPED HAS A CHRONIC COUGH AND DID NOT NOTICE IMPROVEMENT OF COUGH WHEN LISINOPRIL WAS STOPPED   Soap Rash    DIAL soap   Medications:  Current Outpatient Medications:    ACCU-CHEK GUIDE test strip, TEST BLOOD SUGAR 4 TIMES DAILY DX E11.69, Disp: 400 strip, Rfl: 3   Accu-Chek Softclix Lancets lancets, Test BS up to 4 times daily Dx  E11.69, Disp: 400 each, Rfl: 3   acetaminophen (TYLENOL 8 HOUR ARTHRITIS PAIN) 650 MG CR tablet, Take 1 tablet (650 mg total) by mouth every 8 (eight) hours as needed for pain., Disp: 30 tablet, Rfl: 0   albuterol (VENTOLIN HFA) 108 (90 Base) MCG/ACT inhaler, TAKE 2 PUFFS BY MOUTH EVERY 6 HOURS AS NEEDED FOR WHEEZE OR SHORTNESS OF BREATH, Disp: 54 each, Rfl: 1   alendronate (FOSAMAX) 70 MG tablet, TAKE 1 TABLET EVERY 7 DAYS WITH A FULL GLASS OF WATER ON AN EMPTY STOMACH, Disp: 12 tablet, Rfl: 1   ALPRAZolam (XANAX) 1 MG tablet, ! Each morning, one each evening, and 1/2 each afternoon, Disp: 75 tablet, Rfl: 5   amLODipine (NORVASC) 10 MG tablet, TAKE 1 TABLET BY MOUTH EVERY DAY, Disp: 90 tablet, Rfl: 3   aspirin 81 MG chewable tablet, Chew 1 tablet (81 mg total) by mouth daily. X 90 days, Disp: 90 tablet, Rfl: 0   atorvastatin (LIPITOR) 80 MG tablet, Take 1 tablet (80 mg total) by mouth daily., Disp: 90 tablet, Rfl: 2   Bempedoic Acid-Ezetimibe (NEXLIZET) 180-10 MG TABS, Take 180 mg by mouth daily., Disp: 30 tablet, Rfl: 6   Blood Glucose Monitoring Suppl (ACCU-CHEK GUIDE) w/Device KIT, Test Bs QID Dx E11.69, Disp: 1 kit, Rfl: 0   clopidogrel (PLAVIX) 75 MG tablet, TAKE 1 TABLET BY MOUTH EVERY DAY WITH BREAKFAST, Disp: 90 tablet, Rfl: 3   conjugated estrogens (PREMARIN) vaginal cream, Use 0.5 gm in vagina nightly for 1 week then 2 x weekly, Disp: 12 g, Rfl: 0   dapagliflozin propanediol (FARXIGA) 10 MG TABS tablet, Take 1 tablet (10 mg total) by mouth daily., Disp: 90 tablet, Rfl: 5   diclofenac Sodium (VOLTAREN) 1 % GEL, Apply 4 g topically 4 (four) times daily., Disp: 350 g, Rfl: 0   escitalopram (LEXAPRO) 10 MG tablet, Take 1 tablet (10 mg total) by mouth daily., Disp: 90 tablet, Rfl: 3   famotidine (PEPCID) 20 MG tablet, Take 1 tablet (20 mg total) by mouth at bedtime., Disp: 90 tablet, Rfl: 3   furosemide (LASIX) 20 MG tablet, TAKE 1 TABLET BY MOUTH EVERY DAY, Disp: 90 tablet, Rfl: 1   glipiZIDE  (GLUCOTROL) 5 MG tablet, Take 1 tablet (5 mg total) by mouth daily before breakfast., Disp: 90 tablet, Rfl: 3   loratadine (CLARITIN) 10 MG tablet, Take 1 tablet (10 mg total) by mouth daily., Disp: 30 tablet, Rfl: 11   naproxen (NAPROSYN) 500 MG tablet, Take 1 tablet (500 mg total) by mouth 2 (two) times daily with a meal., Disp: 60 tablet, Rfl: 1   nitroGLYCERIN (NITROSTAT) 0.4 MG SL tablet, Place 1 tablet (0.4 mg total) under the tongue every 5 (five) minutes as needed for chest pain., Disp: 25  tablet, Rfl: 10   ondansetron (ZOFRAN-ODT) 4 MG disintegrating tablet, Take 1 tablet (4 mg total) by mouth every 6 (six) hours as needed for nausea or vomiting., Disp: 40 tablet, Rfl: 1   oxybutynin (DITROPAN-XL) 5 MG 24 hr tablet, Take 1 tablet (5 mg total) by mouth at bedtime., Disp: 90 tablet, Rfl: 3   pantoprazole (PROTONIX) 40 MG tablet, Take 1 tablet (40 mg total) by mouth daily. For stomach, Disp: 30 tablet, Rfl: 11   tirzepatide (MOUNJARO) 10 MG/0.5ML Pen, Inject 10 mg into the skin once a week., Disp: 6 mL, Rfl: 2   triamcinolone ointment (KENALOG) 0.5 %, Apply 1 application. topically 2 (two) times daily., Disp: 30 g, Rfl: 0   vitamin B-12 (CYANOCOBALAMIN) 500 MCG tablet, Take 1 tablet (500 mcg total) by mouth daily., Disp: , Rfl:   Observations/Objective: Patient is well-developed, well-nourished in no acute distress.  Resting comfortably  at home.  Head is normocephalic, atraumatic.  No labored breathing.  Speech is clear and coherent with logical content.  Patient is alert and oriented at baseline.  Says entire head hurts- even hair hurts  Assessment and Plan:  AIYONNA LUCADO in today with chief complaint of Headache   1. Acute intractable headache, unspecified headache type Rest Cool compresses RTOprn - ketorolac (TORADOL) injection 60 mg - promethazine (PHENERGAN) injection 25 mg  *patient oming bak to office for injections.  The above assessment and management plan was  discussed with the patient. The patient verbalized understanding of and has agreed to the management plan. Patient is aware to call the clinic if symptoms persist or worsen. Patient is aware when to return to the clinic for a follow-up visit. Patient educated on when it is appropriate to go to the emergency department.   Mary-Margaret Hassell Done, FNP    Follow Up Instructions: I discussed the assessment and treatment plan with the patient. The patient was provided an opportunity to ask questions and all were answered. The patient agreed with the plan and demonstrated an understanding of the instructions.  A copy of instructions were sent to the patient via MyChart.  The patient was advised to call back or seek an in-person evaluation if the symptoms worsen or if the condition fails to improve as anticipated.  Time:  I spent 9 minutes with the patient via telehealth technology discussing the above problems/concerns.    Mary-Margaret Hassell Done, FNP

## 2022-12-28 NOTE — Patient Instructions (Signed)
General Headache Without Cause A headache is pain or discomfort you feel around the head or neck area. There are many causes and types of headaches. In some cases, the cause may not be found. Follow these instructions at home: Watch your condition for any changes. Let your doctor know about them. Take these steps to help with your condition: Managing pain     Take over-the-counter and prescription medicines only as told by your doctor. This includes medicines for pain that are taken by mouth or put on the skin. Lie down in a dark, quiet room when you have a headache. If told, put ice on your head and neck area: Put ice in a plastic bag. Place a towel between your skin and the bag. Leave the ice on for 20 minutes, 2-3 times per day. Take off the ice if your skin turns bright red. This is very important. If you cannot feel pain, heat, or cold, you have a greater risk of damage to the area. If told, put heat on the affected area. Use the heat source that your doctor recommends, such as a moist heat pack or a heating pad. Place a towel between your skin and the heat source. Leave the heat on for 20-30 minutes. Take off the heat if your skin turns bright red. This is very important. If you cannot feel pain, heat, or cold, you have a greater risk of getting burned. Keep lights dim if bright lights bother you or make your headaches worse. Eating and drinking Eat meals on a regular schedule. If you drink alcohol: Limit how much you have to: 0-1 drink a day for women who are not pregnant. 0-2 drinks a day for men. Know how much alcohol is in a drink. In the [place] one drink equals one 12 oz bottle of beer (355 mL), one 5 oz glass of wine (148 mL), or one 1 oz glass of hard liquor (44 mL). Stop drinking caffeine, or drink less caffeine. General instructions  Keep a journal to find out if certain things bring on headaches. For example, write down: What you eat and drink. How much sleep you  get. Any change to your diet or medicines. Get a massage or try other ways to relax. Limit stress. Sit up straight. Do not tighten (tense) your muscles. Do not smoke or use any products that contain nicotine or tobacco. If you need help quitting, ask your doctor. Exercise regularly as told by your doctor. Get enough sleep. This often means 7-9 hours of sleep each night. Keep all follow-up visits. This is important. Contact a doctor if: Medicine does not help your symptoms. You have a headache that feels different than the other headaches. You feel like you may vomit (nauseous) or you vomit. You have a fever. Get help right away if: Your headache: Gets very bad quickly. Gets worse after a lot of physical activity. You have any of these symptoms: You continue to vomit. A stiff neck. Trouble seeing. Your eye or ear hurts. Trouble speaking. Weak muscles or you lose muscle control. You lose your balance or have trouble walking. You feel like you will pass out (faint) or you pass out. You are mixed up (confused). You have a seizure. These symptoms may be an emergency. Get help right away. Call your local emergency services (911 in the [place] Do not wait to see if the symptoms will go away. Do not drive yourself to the hospital. Summary A headache is pain or discomfort that   is felt around the head or neck area. There are many causes and types of headaches. In some cases, the cause may not be found. Keep a journal to help find out what causes your headaches. Watch your condition for any changes. Let your doctor know about them. Contact a doctor if you have a headache that is different from usual, or if medicine does not help your headache. Get help right away if your headache gets very bad, you throw up, you have trouble seeing, you lose your balance, or you have a seizure. This information is not intended to replace advice given to you by your health care provider. Make sure you  discuss any questions you have with your health care provider. Document Revised: 04/12/2021 Document Reviewed: 04/12/2021 Elsevier Patient Education  2023 Elsevier Inc.  

## 2022-12-31 DIAGNOSIS — R1010 Upper abdominal pain, unspecified: Secondary | ICD-10-CM | POA: Diagnosis not present

## 2023-01-09 DIAGNOSIS — R109 Unspecified abdominal pain: Secondary | ICD-10-CM | POA: Diagnosis not present

## 2023-01-09 DIAGNOSIS — R197 Diarrhea, unspecified: Secondary | ICD-10-CM | POA: Diagnosis not present

## 2023-01-09 DIAGNOSIS — R112 Nausea with vomiting, unspecified: Secondary | ICD-10-CM | POA: Diagnosis not present

## 2023-01-11 DIAGNOSIS — R112 Nausea with vomiting, unspecified: Secondary | ICD-10-CM | POA: Diagnosis not present

## 2023-01-11 DIAGNOSIS — R109 Unspecified abdominal pain: Secondary | ICD-10-CM | POA: Diagnosis not present

## 2023-01-11 DIAGNOSIS — R197 Diarrhea, unspecified: Secondary | ICD-10-CM | POA: Diagnosis not present

## 2023-01-18 ENCOUNTER — Other Ambulatory Visit: Payer: Self-pay | Admitting: Family Medicine

## 2023-01-19 DIAGNOSIS — Z20822 Contact with and (suspected) exposure to covid-19: Secondary | ICD-10-CM | POA: Diagnosis not present

## 2023-01-21 ENCOUNTER — Ambulatory Visit (INDEPENDENT_AMBULATORY_CARE_PROVIDER_SITE_OTHER): Payer: Medicare HMO | Admitting: Family Medicine

## 2023-01-21 ENCOUNTER — Encounter: Payer: Self-pay | Admitting: Family Medicine

## 2023-01-21 VITALS — BP 137/80 | HR 86 | Temp 98.7°F | Ht 62.0 in | Wt 162.0 lb

## 2023-01-21 DIAGNOSIS — B9689 Other specified bacterial agents as the cause of diseases classified elsewhere: Secondary | ICD-10-CM

## 2023-01-21 DIAGNOSIS — J019 Acute sinusitis, unspecified: Secondary | ICD-10-CM

## 2023-01-21 DIAGNOSIS — Z20822 Contact with and (suspected) exposure to covid-19: Secondary | ICD-10-CM | POA: Diagnosis not present

## 2023-01-21 MED ORDER — BENZONATATE 100 MG PO CAPS: 100.0000 mg | ORAL_CAPSULE | Freq: Three times a day (TID) | ORAL | 0 refills | Status: DC | PRN

## 2023-01-21 MED ORDER — PROMETHAZINE-DM 6.25-15 MG/5ML PO SYRP: 2.5000 mL | ORAL_SOLUTION | Freq: Every evening | ORAL | 0 refills | Status: DC | PRN

## 2023-01-21 MED ORDER — DOXYCYCLINE HYCLATE 100 MG PO TABS: 100.0000 mg | ORAL_TABLET | Freq: Two times a day (BID) | ORAL | 0 refills | Status: AC

## 2023-01-21 NOTE — Patient Instructions (Signed)

## 2023-01-21 NOTE — Progress Notes (Signed)
Subjective: CC:URI PCP: Yvonne Fraise, MD [id number] V Age is a 72 y.o. female presenting to clinic today for:  1. Sinus Patient reports that she has had a 2-week history of progressive sinus issues.  She reports cough that is worse at nighttime but is not sure if she is having any postnasal drainage.  Denies any fevers, myalgia.  She reports facial pain, nasal symptoms and not on productive cough.  No reports of hemoptysis, shortness of breath or wheezing.  No known sick contacts.  Has not utilize anything over-the-counter because she was not sure what was safe to take.  She cannot utilize nasal sprays secondary to some type of tear duct surgery previously.   ROS: Per HPI  Allergies  Allergen Reactions   Penicillins Hives and Itching    Has patient had a PCN reaction causing immediate rash, facial/tongue/throat swelling, SOB or lightheadedness with hypotension: Yes Has patient had a PCN reaction causing severe rash involving mucus membranes or skin necrosis: Unk Has patient had a PCN reaction that required hospitalization: No Has patient had a PCN reaction occurring within the last 10 years: No If all of the above answers are "NO", then may proceed with Cephalosporin use.  Has patient had a PCN reaction causing immediate rash, facial/tongue/throat swelling, SOB or lightheadedness with hypotension: Yes Has patient had a PCN reaction causing severe rash involving mucus membranes or skin necrosis: No Has patient had a PCN reaction that required hospitalization No Has patient had a PCN reaction occurring within the last 10 years: No If all of the above answers are "NO", then may proceed with Cephalosporin use. Has patient had a PCN reaction causing immediate rash, facial/tongue/throat swelling, SOB or lightheadedness with hypotension: Yes Has patient had a PCN reaction causing severe rash involving mucus membranes or skin necrosis: Unk Has patient had a PCN reaction that required  hospitalization: No Has patient had a PCN reaction occurring within the last 10 years: No If all of the above answers are "NO", then may proceed with Cephalosporin use. Has patient had a PCN reaction causing immediate rash, facial/tongue/throat swelling, SOB or lightheadedness with hypotension: Yes Has patient had a PCN reaction causing severe rash involving mucus membranes or skin necrosis: No Has patient had a PCN reaction that required hospitalization No Has patient had a PCN reaction occurring within the last 10 years: No If all of the above answers are "NO", then may proceed with Cephalosporin use. Has patient had a PCN reaction causing immediate rash, facial/tongue/throat swelling, SOB or lightheadedness with hypotension: Yes Has patient had a PCN reaction causing sev... (TRUNCATED)   Metformin And Related Diarrhea    dizziness   Gabapentin Other (See Comments)    Causes a lot of lethargy at a higher dose Causes a lot of lethargy at a higher dose Causes a lot of lethargy at a higher dose   Lisinopril Cough    HAS A CHRONIC COUGH AND DID NOT NOTICE IMPROVEMENT OF COUGH WHEN LISINOPRIL WAS STOPPED HAS A CHRONIC COUGH AND DID NOT NOTICE IMPROVEMENT OF COUGH WHEN LISINOPRIL WAS STOPPED HAS A CHRONIC COUGH AND DID NOT NOTICE IMPROVEMENT OF COUGH WHEN LISINOPRIL WAS STOPPED   Soap Rash    DIAL soap   Past Medical History:  Diagnosis Date   (HFpEF) heart failure with preserved ejection fraction (HCC)    Anxiety    Aortic atherosclerosis (HCC)    Arthritis    left hand   Asthma    daily and prn inhalers  CAD (coronary artery disease)    Carotid artery disease (HCC)    Chronic cough    Chronic otitis media 12/2017   Full dentures    GERD (gastroesophageal reflux disease)    History of stroke 09/2017   weakness right hand, numbness right side face   Hyperlipidemia    Hypertension    states under control with meds., has been on med. x a long time, per pt.   Non-insulin  dependent type 2 diabetes mellitus (HCC)    Orthostatic hypotension    Overactive bladder    PSVT (paroxysmal supraventricular tachycardia)    Recurrent acute suppurative otitis media without spontaneous rupture of left tympanic membrane 02/19/2018   Stroke ([place])    Transient cerebral ischemia     Current Outpatient Medications:    ACCU-CHEK GUIDE test strip, TEST BLOOD SUGAR 4 TIMES DAILY DX E11.69, Disp: 400 strip, Rfl: 3   Accu-Chek Softclix Lancets lancets, Test BS up to 4 times daily Dx E11.69, Disp: 400 each, Rfl: 3   acetaminophen (TYLENOL 8 HOUR ARTHRITIS PAIN) 650 MG CR tablet, Take 1 tablet (650 mg total) by mouth every 8 (eight) hours as needed for pain., Disp: 30 tablet, Rfl: 0   albuterol (VENTOLIN HFA) 108 (90 Base) MCG/ACT inhaler, TAKE 2 PUFFS BY MOUTH EVERY 6 HOURS AS NEEDED FOR WHEEZE OR SHORTNESS OF BREATH, Disp: 54 each, Rfl: 1   alendronate (FOSAMAX) 70 MG tablet, TAKE 1 TABLET EVERY 7 DAYS WITH A FULL GLASS OF WATER ON AN EMPTY STOMACH, Disp: 12 tablet, Rfl: 0   ALPRAZolam (XANAX) 1 MG tablet, ! Each morning, one each evening, and 1/2 each afternoon, Disp: 75 tablet, Rfl: 5   amLODipine (NORVASC) 10 MG tablet, TAKE 1 TABLET BY MOUTH EVERY DAY, Disp: 90 tablet, Rfl: 3   aspirin 81 MG chewable tablet, Chew 1 tablet (81 mg total) by mouth daily. X 90 days, Disp: 90 tablet, Rfl: 0   atorvastatin (LIPITOR) 80 MG tablet, Take 1 tablet (80 mg total) by mouth daily., Disp: 90 tablet, Rfl: 2   Bempedoic Acid-Ezetimibe (NEXLIZET) 180-10 MG TABS, Take 180 mg by mouth daily., Disp: 30 tablet, Rfl: 6   Blood Glucose Monitoring Suppl (ACCU-CHEK GUIDE) w/Device KIT, Test Bs QID Dx E11.69, Disp: 1 kit, Rfl: 0   clopidogrel (PLAVIX) 75 MG tablet, TAKE 1 TABLET BY MOUTH EVERY DAY WITH BREAKFAST, Disp: 90 tablet, Rfl: 3   conjugated estrogens (PREMARIN) vaginal cream, Use 0.5 gm in vagina nightly for 1 week then 2 x weekly, Disp: 12 g, Rfl: 0   dapagliflozin propanediol (FARXIGA) 10 MG TABS  tablet, Take 1 tablet (10 mg total) by mouth daily., Disp: 90 tablet, Rfl: 5   diclofenac Sodium (VOLTAREN) 1 % GEL, Apply 4 g topically 4 (four) times daily., Disp: 350 g, Rfl: 0   escitalopram (LEXAPRO) 10 MG tablet, Take 1 tablet (10 mg total) by mouth daily., Disp: 90 tablet, Rfl: 3   famotidine (PEPCID) 20 MG tablet, Take 1 tablet (20 mg total) by mouth at bedtime., Disp: 90 tablet, Rfl: 3   furosemide (LASIX) 20 MG tablet, TAKE 1 TABLET BY MOUTH EVERY DAY, Disp: 90 tablet, Rfl: 1   glipiZIDE (GLUCOTROL) 5 MG tablet, Take 1 tablet (5 mg total) by mouth daily before breakfast., Disp: 90 tablet, Rfl: 3   loratadine (CLARITIN) 10 MG tablet, Take 1 tablet (10 mg total) by mouth daily., Disp: 30 tablet, Rfl: 11   naproxen (NAPROSYN) 500 MG tablet, Take 1 tablet (500  mg total) by mouth 2 (two) times daily with a meal., Disp: 60 tablet, Rfl: 1   nitroGLYCERIN (NITROSTAT) 0.4 MG SL tablet, Place 1 tablet (0.4 mg total) under the tongue every 5 (five) minutes as needed for chest pain., Disp: 25 tablet, Rfl: 10   ondansetron (ZOFRAN-ODT) 4 MG disintegrating tablet, Take 1 tablet (4 mg total) by mouth every 6 (six) hours as needed for nausea or vomiting., Disp: 40 tablet, Rfl: 1   oxybutynin (DITROPAN-XL) 5 MG 24 hr tablet, Take 1 tablet (5 mg total) by mouth at bedtime., Disp: 90 tablet, Rfl: 3   pantoprazole (PROTONIX) 40 MG tablet, Take 1 tablet (40 mg total) by mouth daily. For stomach, Disp: 30 tablet, Rfl: 11   tirzepatide (MOUNJARO) 10 MG/0.5ML Pen, Inject 10 mg into the skin once a week., Disp: 6 mL, Rfl: 2   triamcinolone ointment (KENALOG) 0.5 %, Apply 1 application. topically 2 (two) times daily., Disp: 30 g, Rfl: 0   vitamin B-12 (CYANOCOBALAMIN) 500 MCG tablet, Take 1 tablet (500 mcg total) by mouth daily., Disp: , Rfl:   Current Facility-Administered Medications:    promethazine (PHENERGAN) injection 25 mg, 25 mg, Intramuscular, Q6H PRN, Hassell Done, Mary-Margaret, FNP, 25 mg at 12/28/22  1316 Social History   Socioeconomic History   Marital status: Married    Spouse name: Not on file   Number of children: 3   Years of education: Not on file   Highest education level: GED or equivalent  Occupational History   Occupation: retired    Comment: Customer service manager and restaurants  Tobacco Use   Smoking status: Never   Smokeless tobacco: Never  Scientific laboratory technician Use: Never used  Substance and Sexual Activity   Alcohol use: Not Currently   Drug use: No   Sexual activity: Yes    Birth control/protection: Surgical    Comment: hyst  Other Topics Concern   Not on file  Social History Narrative   Lives at home home with husband with    Son, daughter in law and 3 children.  [name], his girlfriend and her child also moved in 10/2018      Disabled.   Social Determinants of Health   Financial Resource Strain: Medium Risk (11/23/2022)   Overall Financial Resource Strain (CARDIA)    Difficulty of Paying Living Expenses: Somewhat hard  Food Insecurity: No Food Insecurity (11/23/2022)   Hunger Vital Sign    Worried About Running Out of Food in the Last Year: Never true    Ran Out of Food in the Last Year: Never true  Transportation Needs: No Transportation Needs (11/23/2022)   PRAPARE - Hydrologist (Medical): No    Lack of Transportation (Non-Medical): No  Physical Activity: Insufficiently Active (11/23/2022)   Exercise Vital Sign    Days of Exercise per Week: 3 days    Minutes of Exercise per Session: 20 min  Stress: Stress Concern Present (11/23/2022)   [place]    Feeling of Stress : To some extent  Social Connections: Moderately Integrated (11/23/2022)   Social Connection and Isolation Panel [NHANES]    Frequency of Communication with Friends and Family: Twice a week    Frequency of Social Gatherings with Friends and Family: Three times a week    Attends  Religious Services: 1 to 4 times per year    Active Member of Clubs or Organizations: No    Attends Club or  Organization Meetings: Never    Marital Status: Married  Human resources officer Violence: Not At Risk (11/23/2022)   Humiliation, Afraid, Rape, and Kick questionnaire    Fear of Current or Ex-Partner: No    Emotionally Abused: No    Physically Abused: No    Sexually Abused: No   Family History  Problem Relation Age of Onset   Breast cancer Mother    Arthritis Mother    Diabetes Mother    CVA Mother    Stroke Mother    Diabetes Father    Heart disease Father        CABG.  Does not know age of onset   Asthma Father    Hepatitis C Sister    Peripheral Artery Disease Daughter    Arthritis Brother    Cancer Brother        metastic cancer   Arthritis-Osteo Son     Objective: Office vital signs reviewed. BP 137/80   Pulse 86   Temp 98.7 F (37.1 C)   Ht '5\' 2"'$  (1.575 m)   Wt 162 lb (73.5 kg)   SpO2 97%   BMI 29.63 kg/m   Physical Examination:  General: Awake, alert, tired appearing female, No acute distress HEENT: Normal    Neck: No masses palpated. No lymphadenopathy    Ears: Tympanic membranes intact, normal light reflex, no erythema, no bulging.  Scarring present bilaterally    Eyes: PERRLA, extraocular membranes intact, sclera white    Nose: nasal turbinates moist, opaque nasal discharge    Throat: moist mucus membranes, mild oropharyngeal erythema, no tonsillar exudate.  Airway is patent Cardio: regular rate and rhythm, S1S2 heard, no murmurs appreciated Pulm: clear to auscultation bilaterally, no wheezes, rhonchi or rales; normal work of breathing on room air  Assessment/ Plan: 72 y.o. female   Acute bacterial sinusitis - Plan: benzonatate (TESSALON PERLES) 100 MG capsule, promethazine-dextromethorphan (PROMETHAZINE-DM) 6.25-15 MG/5ML syrup, doxycycline (VIBRA-TABS) 100 MG tablet  Will treat with antibiotics given progressive symptoms that are now 2 weeks out.   Home care instructions reviewed and reasons for reevaluation discussed.  Cautioned against use of NSAIDs as her renal function is impaired.  She is unaware of any CKD so I did discuss adequate hydration and recommended that she follow-up with PCP on that.  She has an appointment scheduled for 2 weeks out.  No orders of the defined types were placed in this encounter.  No orders of the defined types were placed in this encounter.    Janora Norlander, DO [place] [phone]

## 2023-01-23 DIAGNOSIS — Z20822 Contact with and (suspected) exposure to covid-19: Secondary | ICD-10-CM | POA: Diagnosis not present

## 2023-01-24 DIAGNOSIS — Z20822 Contact with and (suspected) exposure to covid-19: Secondary | ICD-10-CM | POA: Diagnosis not present

## 2023-01-25 DIAGNOSIS — Z20822 Contact with and (suspected) exposure to covid-19: Secondary | ICD-10-CM | POA: Diagnosis not present

## 2023-01-28 DIAGNOSIS — Z20822 Contact with and (suspected) exposure to covid-19: Secondary | ICD-10-CM | POA: Diagnosis not present

## 2023-02-06 ENCOUNTER — Encounter: Payer: Self-pay | Admitting: Family Medicine

## 2023-02-06 ENCOUNTER — Ambulatory Visit: Payer: Medicare PPO | Admitting: Family Medicine

## 2023-02-06 VITALS — BP 151/72 | HR 71 | Temp 97.6°F | Ht 62.0 in | Wt 159.6 lb

## 2023-02-06 DIAGNOSIS — E1122 Type 2 diabetes mellitus with diabetic chronic kidney disease: Secondary | ICD-10-CM | POA: Diagnosis not present

## 2023-02-06 DIAGNOSIS — I10 Essential (primary) hypertension: Secondary | ICD-10-CM

## 2023-02-06 DIAGNOSIS — N1831 Chronic kidney disease, stage 3a: Secondary | ICD-10-CM

## 2023-02-06 DIAGNOSIS — E782 Mixed hyperlipidemia: Secondary | ICD-10-CM | POA: Diagnosis not present

## 2023-02-06 LAB — LIPID PANEL

## 2023-02-06 LAB — BAYER DCA HB A1C WAIVED: HB A1C (BAYER DCA - WAIVED): 5.9 % — ABNORMAL HIGH (ref 4.8–5.6)

## 2023-02-06 MED ORDER — GLIPIZIDE 5 MG PO TABS: 5.0000 mg | ORAL_TABLET | Freq: Every day | ORAL | 3 refills | Status: DC

## 2023-02-06 MED ORDER — AMLODIPINE BESYLATE 10 MG PO TABS: ORAL_TABLET | ORAL | 3 refills | Status: DC

## 2023-02-06 MED ORDER — CLOPIDOGREL BISULFATE 75 MG PO TABS: ORAL_TABLET | ORAL | 3 refills | Status: DC

## 2023-02-06 MED ORDER — TIRZEPATIDE 5 MG/0.5ML ~~LOC~~ SOAJ: 5.0000 mg | SUBCUTANEOUS | 1 refills | Status: DC

## 2023-02-06 NOTE — Progress Notes (Signed)
Subjective:  Patient ID: Yvonne [name],  female    DOB: 04-20-1951  Age: 72 y.o.    CC: Medical Management of Chronic Issues   HPI Yvonne [name] presents for  follow-up of hypertension. Patient has no history of headache chest pain or shortness of breath or recent cough. Patient also denies symptoms of TIA such as numbness weakness lateralizing. Patient denies side effects from medication. States taking it regularly.  Patient also  in for follow-up of elevated cholesterol. Doing well without complaints on current medication. Denies side effects  including myalgia and arthralgia and nausea. Also in today for liver function testing. Currently no chest pain, shortness of breath or other cardiovascular related symptoms noted.  Follow-up of diabetes. Patient does check blood sugar at home. Readings run between 90 and 110 Patient denies symptoms such as excessive hunger or urinary frequency, excessive hunger, nausea No significant hypoglycemic spells noted. Medications reviewed. Pt reports taking them regularly. Pt. denies complication/adverse reaction today.   Diarrhea continues. Day after each mounjaro has profuse diarrhea with abd pain. Gets a litle better each day   History Eliot has a past medical history of (HFpEF) heart failure with preserved ejection fraction ([place]), Anxiety, Aortic atherosclerosis ([place]), Arthritis, Asthma, CAD (coronary artery disease), Carotid artery disease ([place]), Chronic cough, Chronic otitis media (12/2017), Full dentures, GERD (gastroesophageal reflux disease), History of stroke (09/2017), Hyperlipidemia, Hypertension, Non-insulin dependent type 2 diabetes mellitus ([place]), Orthostatic hypotension, Overactive bladder, PSVT (paroxysmal supraventricular tachycardia), Recurrent acute suppurative otitis media without spontaneous rupture of left tympanic membrane (02/19/2018), Stroke ([place]), and Transient cerebral ischemia.   She has a past surgical history that includes  Cholecystectomy; Colonoscopy (N/A, 08/17/2015); Abdominal hysterectomy; Lacrimal duct exploration (Bilateral); Cataract extraction w/ intraocular lens implant (Right); Vitrectomy (Left, 09/14/2015); Gas insertion (Right); and Myringotomy with tube placement (Bilateral, 01/28/2018).   Her family history includes Arthritis in her brother and mother; Arthritis-Osteo in her son; Asthma in her father; Breast cancer in her mother; CVA in her mother; Cancer in her brother; Diabetes in her father and mother; Heart disease in her father; Hepatitis C in her sister; Peripheral Artery Disease in her daughter; Stroke in her mother.She reports that she has never smoked. She has never used smokeless tobacco. She reports that she does not currently use alcohol. She reports that she does not use drugs.  Current Outpatient Medications on File Prior to Visit  Medication Sig Dispense Refill   ACCU-CHEK GUIDE test strip TEST BLOOD SUGAR 4 TIMES DAILY DX E11.69 400 strip 3   Accu-Chek Softclix Lancets lancets Test BS up to 4 times daily Dx E11.69 400 each 3   albuterol (VENTOLIN HFA) 108 (90 Base) MCG/ACT inhaler TAKE 2 PUFFS BY MOUTH EVERY 6 HOURS AS NEEDED FOR WHEEZE OR SHORTNESS OF BREATH 54 each 1   alendronate (FOSAMAX) 70 MG tablet TAKE 1 TABLET EVERY 7 DAYS WITH A FULL GLASS OF WATER ON AN EMPTY STOMACH 12 tablet 0   ALPRAZolam (XANAX) 1 MG tablet ! Each morning, one each evening, and 1/2 each afternoon 75 tablet 5   atorvastatin (LIPITOR) 80 MG tablet Take 1 tablet (80 mg total) by mouth daily. 90 tablet 2   Bempedoic Acid-Ezetimibe (NEXLIZET) 180-10 MG TABS Take 180 mg by mouth daily. 30 tablet 6   Blood Glucose Monitoring Suppl (ACCU-CHEK GUIDE) w/Device KIT Test Bs QID Dx E11.69 1 kit 0   conjugated estrogens (PREMARIN) vaginal cream Use 0.5 gm in vagina nightly for 1 week then 2 x  weekly 12 g 0   dapagliflozin propanediol (FARXIGA) 10 MG TABS tablet Take 1 tablet (10 mg total) by mouth daily. 90 tablet 5    diclofenac Sodium (VOLTAREN) 1 % GEL Apply 4 g topically 4 (four) times daily. 350 g 0   famotidine (PEPCID) 20 MG tablet Take 1 tablet (20 mg total) by mouth at bedtime. 90 tablet 3   furosemide (LASIX) 20 MG tablet TAKE 1 TABLET BY MOUTH EVERY DAY 90 tablet 1   naproxen (NAPROSYN) 500 MG tablet Take 1 tablet (500 mg total) by mouth 2 (two) times daily with a meal. 60 tablet 1   nitroGLYCERIN (NITROSTAT) 0.4 MG SL tablet Place 1 tablet (0.4 mg total) under the tongue every 5 (five) minutes as needed for chest pain. 25 tablet 10   oxybutynin (DITROPAN-XL) 5 MG 24 hr tablet Take 1 tablet (5 mg total) by mouth at bedtime. 90 tablet 3   pantoprazole (PROTONIX) 40 MG tablet Take 1 tablet (40 mg total) by mouth daily. For stomach 30 tablet 11   promethazine-dextromethorphan (PROMETHAZINE-DM) 6.25-15 MG/5ML syrup Take 2.5 mLs by mouth at bedtime as needed for cough. 118 mL 0   triamcinolone ointment (KENALOG) 0.5 % Apply 1 application. topically 2 (two) times daily. 30 g 0   vitamin B-12 (CYANOCOBALAMIN) 500 MCG tablet Take 1 tablet (500 mcg total) by mouth daily.     Current Facility-Administered Medications on File Prior to Visit  Medication Dose Route Frequency Provider Last Rate Last Admin   promethazine (PHENERGAN) injection 25 mg  25 mg Intramuscular Q6H PRN Hassell Done, Mary-Margaret, FNP   25 mg at 12/28/22 1316    ROS Review of Systems  Objective:  BP (!) 151/72   Pulse 71   Temp 97.6 F (36.4 C)   Ht '5\' 2"'$  (1.575 m)   Wt 159 lb 9.6 oz (72.4 kg)   SpO2 100%   BMI 29.19 kg/m   BP Readings from Last 3 Encounters:  02/06/23 (!) 151/72  01/21/23 137/80  11/23/22 125/81    Wt Readings from Last 3 Encounters:  02/06/23 159 lb 9.6 oz (72.4 kg)  01/21/23 162 lb (73.5 kg)  11/23/22 164 lb (74.4 kg)     Physical Exam  Diabetic Foot Exam - Simple   No data filed     Lab Results  Component Value Date   HGBA1C 7.5 (H) 11/07/2022   HGBA1C 8.7 (H) 08/07/2022   HGBA1C 8.1 (H)  05/03/2022    Assessment & Plan:   Bera was seen today for medical management of chronic issues.  Diagnoses and all orders for this visit:  Mixed hyperlipidemia -     Lipid panel  Type 2 diabetes mellitus with stage 3a chronic kidney disease, without long-term current use of insulin (HCC) -     Bayer DCA Hb A1c Waived -     glipiZIDE (GLUCOTROL) 5 MG tablet; Take 1 tablet (5 mg total) by mouth daily before breakfast.  Primary hypertension -     CBC with Differential/Platelet -     CMP14+EGFR  Essential hypertension -     amLODipine (NORVASC) 10 MG tablet; TAKE 1 TABLET BY MOUTH EVERY DAY  Other orders -     clopidogrel (PLAVIX) 75 MG tablet; TAKE 1 TABLET BY MOUTH EVERY DAY WITH BREAKFAST -     tirzepatide (MOUNJARO) 5 MG/0.5ML Pen; Inject 5 mg into the skin once a week.   I have discontinued [name] Merkey's tirzepatide and benzonatate. I am also having  her start on tirzepatide. Additionally, I am having her maintain her Accu-Chek Softclix Lancets, nitroGLYCERIN, Accu-Chek Guide, cyanocobalamin, Nexlizet, Accu-Chek Guide, triamcinolone ointment, atorvastatin, oxybutynin, famotidine, diclofenac Sodium, albuterol, dapagliflozin propanediol, pantoprazole, ALPRAZolam, naproxen, furosemide, Premarin, alendronate, promethazine-dextromethorphan, amLODipine, clopidogrel, and glipiZIDE. We will continue to administer promethazine.  Meds ordered this encounter  Medications   amLODipine (NORVASC) 10 MG tablet    Sig: TAKE 1 TABLET BY MOUTH EVERY DAY    Dispense:  90 tablet    Refill:  3   clopidogrel (PLAVIX) 75 MG tablet    Sig: TAKE 1 TABLET BY MOUTH EVERY DAY WITH BREAKFAST    Dispense:  90 tablet    Refill:  3   glipiZIDE (GLUCOTROL) 5 MG tablet    Sig: Take 1 tablet (5 mg total) by mouth daily before breakfast.    Dispense:  90 tablet    Refill:  3   tirzepatide (MOUNJARO) 5 MG/0.5ML Pen    Sig: Inject 5 mg into the skin once a week.    Dispense:  6 mL    Refill:  1      Follow-up: No follow-ups on file.  [name], M.D.

## 2023-02-07 LAB — CBC WITH DIFFERENTIAL/PLATELET
Basophils Absolute: 0 10*3/uL (ref 0.0–0.2)
Basos: 1 %
EOS (ABSOLUTE): 0.1 10*3/uL (ref 0.0–0.4)
Eos: 2 %
Hematocrit: 41.2 % (ref 34.0–46.6)
Hemoglobin: 14.1 g/dL (ref 11.1–15.9)
Immature Grans (Abs): 0 10*3/uL (ref 0.0–0.1)
Immature Granulocytes: 0 %
Lymphocytes Absolute: 2.1 10*3/uL (ref 0.7–3.1)
Lymphs: 44 %
MCH: 29.1 pg (ref 26.6–33.0)
MCHC: 34.2 g/dL (ref 31.5–35.7)
MCV: 85 fL (ref 79–97)
Monocytes Absolute: 0.3 10*3/uL (ref 0.1–0.9)
Monocytes: 6 %
Neutrophils Absolute: 2.2 10*3/uL (ref 1.4–7.0)
Neutrophils: 47 %
Platelets: 231 10*3/uL (ref 150–450)
RBC: 4.85 x10E6/uL (ref 3.77–5.28)
RDW: 12.6 % (ref 11.7–15.4)
WBC: 4.7 10*3/uL (ref 3.4–10.8)

## 2023-02-07 LAB — LIPID PANEL
Chol/HDL Ratio: 4.2 ratio (ref 0.0–4.4)
Cholesterol, Total: 230 mg/dL — ABNORMAL HIGH (ref 100–199)
HDL: 55 mg/dL (ref >39)
LDL Chol Calc (NIH): 150 mg/dL — ABNORMAL HIGH (ref 0–99)
Triglycerides: 138 mg/dL (ref 0–149)
VLDL Cholesterol Cal: 25 mg/dL (ref 5–40)

## 2023-02-07 LAB — CMP14+EGFR
ALT: 10 IU/L (ref 0–32)
AST: 17 IU/L (ref 0–40)
Albumin/Globulin Ratio: 1.8 (ref 1.2–2.2)
Albumin: 4.2 g/dL (ref 3.8–4.8)
Alkaline Phosphatase: 93 IU/L (ref 44–121)
BUN/Creatinine Ratio: 22 (ref 12–28)
BUN: 22 mg/dL (ref 8–27)
Bilirubin Total: 0.4 mg/dL (ref 0.0–1.2)
CO2: 22 mmol/L (ref 20–29)
Calcium: 9.5 mg/dL (ref 8.7–10.3)
Chloride: 105 mmol/L (ref 96–106)
Creatinine, Ser: 0.99 mg/dL (ref 0.57–1.00)
Globulin, Total: 2.4 g/dL (ref 1.5–4.5)
Glucose: 118 mg/dL — ABNORMAL HIGH (ref 70–99)
Potassium: 3.8 mmol/L (ref 3.5–5.2)
Sodium: 141 mmol/L (ref 134–144)
Total Protein: 6.6 g/dL (ref 6.0–8.5)
eGFR: 60 mL/min/{1.73_m2} (ref >59)

## 2023-02-12 ENCOUNTER — Other Ambulatory Visit: Payer: Self-pay | Admitting: Family Medicine

## 2023-02-12 DIAGNOSIS — Z20822 Contact with and (suspected) exposure to covid-19: Secondary | ICD-10-CM | POA: Diagnosis not present

## 2023-02-12 MED ORDER — NEXLIZET 180-10 MG PO TABS: 1.0000 | ORAL_TABLET | Freq: Every day | ORAL | 3 refills | Status: DC

## 2023-02-12 MED ORDER — ATORVASTATIN CALCIUM 80 MG PO TABS: 80.0000 mg | ORAL_TABLET | Freq: Every day | ORAL | 2 refills | Status: DC

## 2023-02-14 DIAGNOSIS — Z20822 Contact with and (suspected) exposure to covid-19: Secondary | ICD-10-CM | POA: Diagnosis not present

## 2023-02-20 ENCOUNTER — Telehealth: Payer: Self-pay | Admitting: Family Medicine

## 2023-02-20 NOTE — Telephone Encounter (Signed)
Patient called requesting to speak with nurse regarding her insulin and dosage. Says she was on a 10 but says Dr [name] put her on 5 now and she doesn't know if that is safe.

## 2023-02-20 NOTE — Telephone Encounter (Signed)
Please clarify : 5 of what and 10 of what?

## 2023-02-21 NOTE — Telephone Encounter (Signed)
Patient aware.

## 2023-02-21 NOTE — Telephone Encounter (Signed)
She should be okay. I just don't want her to continue having diarrhea.

## 2023-02-21 NOTE — Telephone Encounter (Signed)
During the visit it was discussed that pt would decrease from the 7.5. to the 5mg  of Mounjaro.  When pt got home she has 10mg . Pt is questioning if that is going to elevated blood sugar since it is a big decrease from the 10mg  she currently has.

## 2023-03-04 ENCOUNTER — Other Ambulatory Visit: Payer: Self-pay | Admitting: Family Medicine

## 2023-03-05 ENCOUNTER — Ambulatory Visit: Payer: Medicare PPO

## 2023-03-05 VITALS — Ht 62.0 in | Wt 158.0 lb

## 2023-03-05 DIAGNOSIS — Z0000 Encounter for general adult medical examination without abnormal findings: Secondary | ICD-10-CM | POA: Diagnosis not present

## 2023-03-05 DIAGNOSIS — Z780 Asymptomatic menopausal state: Secondary | ICD-10-CM

## 2023-03-05 NOTE — Patient Instructions (Signed)
Yvonne [name] , Thank you for taking time to come for your Medicare Wellness Visit. I appreciate your ongoing commitment to your health goals. Please review the following plan we discussed and let me know if I can assist you in the future.   These are the goals we discussed:  Goals       Client stated: "I want to get help in managing stress and anxiety" (pt-stated)      Current Barriers:  Mental Health Concerns  in patient with Chronic Diagnoses of GAD, HTN, Type 2 DM,HLD, CHF Financial challenges Spouse of client is ill  Clinical Social Work Clinical Goal(s):  Over the next 30 days, [name] will talk with [name] regarding management of anxiety and stress symptoms of client    Interventions: Encouraged client to use relaxation techniques of choice to manage anxiety and stress symptoms Encouraged client to talk regularly with [name] to discuss anxiety and stress symptoms experienced by client Encouraged client to talk with [name] as needed to discuss nursing needs of client Encouraged client to talk with her sister as a means of emotional support  Patient Self Care Activities:  Attends all scheduled provider appointments  Client speaks daily with her sister for emotional support   Client enjoys reading as a way to manage stress   Plan: Client to call [name] as needed to discuss anxiety and stress issues of client [name] to call client in next 3 weeks to discuss relaxation techniques use of client (reading, gardening in flowers) Client to communicate with [name] to discuss client's nursing needs. Client to participate in self care activities of choice Client to communicate with her sister as a means of emotional support. Client to attend scheduled medical appointments  Initial goal documentation      DIET - INCREASE WATER INTAKE      Exercise 3 to 4 times per week (30 min per time)      Walking 2.5 miles daily      limit sugar      Cut out drinks / beverages with sugar.        Manage  Anxiety, Stress and Depression symptoms faced (pt-stated)       Timeframe: Short Term Goal  This Visit's Progress: Not On Track  Priority : High    Start Date: 01/03/22  Expected End Date  04/02/22  Completed Date  Follow Up Date:  02/27/22 at 3:00 PM     Needs help with coping skills to manage anxiety, stress or depression symptoms faced :   Patient Self Care Activities:  Self administers medications as prescribed Attends all scheduled provider appointments Performs ADL's independently  Patient Coping Strengths:  Supportive Relationships Family Self Advocate  Patient Self Care Deficits:  Pain issues Challenges with Diabetes management  Patient Goals:  - exercise at least 2 to 3 times per week - spend time or talk with others every day - practice relaxation or meditation daily - keep a calendar with appointment dates  Follow Up Plan: [name] to call client on 02/27/22 at 3:00 PM to assess client needs       Manage Cholesterol      Timeframe:  Long-Range Goal Priority:  Medium Start Date:      02/22/21                       Expected End Date:   02/22/22  Follow-up: 05/25/21  Follow-up with cardiologist as planned on 04/19/21 Take Lipitor 80 mg as prescribed Call cardiologist or PCP with any muscle aches/pains ADA/Carb modified diet or Plate method diet with 1/2 plate of vegetables, 1/4 lean protein, 1/4 starch Increase activity level as tolerated with a goal of at least 150 min a week Call RN Care Manager as needed [phone]      Monitor and Manage My Blood Sugar-Diabetes Type 2      Timeframe:  Long-Range Goal Priority:  Medium Start Date:     02/22/21                        Expected End Date: 02/22/22                      Follow Up Date 04/13/21   Talk with [name] regarding psychosocial issues Eat at least 3 meals a day 1/2 plate of vegetables, 1/4 protein, and 1/4 starch Pair protein, like cheese or nuts, with a carbohydrate, like a piece  of fruit, for a snack Check blood sugar twice daily and record Call PCP with any readings outside of recommended range Increase activity level as tolerated with a goal of 150 minutes a week Call RN Care Manager as needed [phone]    Why is this important?   Checking your blood sugar at home helps to keep it from getting very high or very low.  Writing the results in a diary or log helps the doctor know how to care for you.  Your blood sugar log should have the time, date and the results.  Also, write down the amount of insulin or other medicine that you take.  Other information, like what you ate, exercise done and how you were feeling, will also be helpful.     Notes:       Protect My Health      T2DM, HLD [name] GOAL (pt-stated)      Current Barriers:  Unable to independently afford treatment regimen Unable to maintain control of T2DM, HLD Suboptimal therapeutic regimen for T2DM, HLD  Pharmacist Clinical Goal(s):  patient will verbalize ability to afford treatment regimen maintain control of T2DM, HLD as evidenced by GOAL A1C<7%, LDL<70  adhere to plan to optimize therapeutic regimen for T2DM, HLD as evidenced by report of adherence to recommended medication management changes through collaboration with [name] and provider.   Interventions: 1:1 collaboration with Mechele Claude, MD regarding development and update of comprehensive plan of care as evidenced by provider attestation and co-signature Inter-disciplinary care team collaboration (see longitudinal plan of care) Comprehensive medication review performed; medication list updated in electronic medical record  Diabetes: Goal on track: NO. Uncontrolled-8.1-->8.7%; current treatment: Mounjaro 7.5mg  sq weekly, FARXIGA 10MG ; GLIPIZIDE  Was on Mounjaro 5mg  -->increased to 7.5mg  weekly and tolerating well Denies personal and family history of Medullary thyroid cancer (MTC) CONTINUE FARXIGA 10mg  daily Will re-enrolled-->  farxiga via [place] patient assistance program Escribe to [employer] in EPIC GFR 78-->56 Current glucose readings: fasting glucose: 115, post prandial glucose: max 173 Denies hypoglycemic/hyperglycemic symptoms Discussed meal planning options and Plate method for healthy eating Avoid sugary drinks and desserts Incorporate balanced protein, non starchy veggies, 1 serving of carbohydrate with each meal Increase water intake Increase physical activity as able Current exercise: N/A; ENCOURAGED Recommended continue to titrate mounjaro as tolerated Assessed patient finances. No assistance for [facility]; will re-enroll for FARXIGA  Hyperlipidemia:  Goal on Track (progressing): YES.  controlled; current treatment:ZETIA, ATORVASTATIN 80MG ; added nexlizet per PCP Medications previously tried: N/A Current dietary patterns: RECOMMENDED HEART HEALTHY/mediterranean Lipid Panel     Component Value Date/Time   CHOL 99 (L) 08/07/2022 0912   TRIG 79 08/07/2022 0912   HDL 55 08/07/2022 0912   CHOLHDL 1.8 08/07/2022 0912   CHOLHDL 3.3 01/17/2022 0444   VLDL 11 01/17/2022 0444   LDLCALC 28 08/07/2022 0912   LABVLDL 16 08/07/2022 0912     Patient Goals/Self-Care Activities patient will:  - take medications as prescribed as evidenced by patient report and record review check glucose DAILY, document, and provide at future appointments collaborate with provider on medication access solutions target a minimum of 150 minutes of moderate intensity exercise weekly engage in dietary modifications by HEALTHY PLATE METHOD, HEART HEALTHY       Track and Manage My Blood Pressure-Hypertension      Timeframe:  Long-Range Goal Priority:  Medium Start Date:   04/13/21                          Expected End Date:  04/13/22                      Follow Up Date 05/25/21   Patient will self administer medications as prescribed Patient will attend all scheduled provider appointments Increase activity level as tolerated  with a goal of at least 150 minutes per week Continue to check and record blood pressure at least 3 times per week Call PCP with any readings outside of recommended range Call RN Care Manager as needed [phone]    Why is this important?   You won't feel high blood pressure, but it can still hurt your blood vessels.  High blood pressure can cause heart or kidney problems. It can also cause a stroke.  Making lifestyle changes like losing a little weight or eating less salt will help.  Checking your blood pressure at home and at different times of the day can help to control blood pressure.  If the doctor prescribes medicine remember to take it the way the doctor ordered.  Call the office if you cannot afford the medicine or if there are questions about it.     Notes:         This is a list of the screening recommended for you and due dates:  Health Maintenance  Topic Date Due   COVID-19 Vaccine (3 - Moderna risk series) 08/09/2020   Complete foot exam   08/07/2022   DEXA scan (bone density measurement)  03/09/2023   Mammogram  06/19/2023   Flu Shot  06/27/2023   Hemoglobin A1C  08/09/2023   Eye exam for diabetics  09/21/2023   Yearly kidney health urinalysis for diabetes  11/08/2023   Yearly kidney function blood test for diabetes  02/06/2024   Medicare Annual Wellness Visit  03/04/2024   Colon Cancer Screening  12/21/2029   DTaP/Tdap/Td vaccine (3 - Td or Tdap) 05/23/2031   Pneumonia Vaccine  Completed   Hepatitis C Screening: USPSTF Recommendation to screen - Ages 61-79 yo.  Completed   Zoster (Shingles) Vaccine  Completed   HPV Vaccine  Aged Out   Stool Blood Test  Discontinued    Advanced directives: Advance directive discussed with you today. I have provided a copy for you to complete at home and have notarized. Once this is complete please bring a copy in to our office so we  can scan it into your chart.   Conditions/risks identified: Aim for 30 minutes of exercise or  brisk walking, 6-8 glasses of water, and 5 servings of fruits and vegetables each day.   Next appointment: Follow up in one year for your annual wellness visit    Preventive Care 65 Years and Older, Female Preventive care refers to lifestyle choices and visits with your health care provider that can promote health and wellness. What does preventive care include? A yearly physical exam. This is also called an annual well check. Dental exams once or twice a year. Routine eye exams. Ask your health care provider how often you should have your eyes checked. Personal lifestyle choices, including: Daily care of your teeth and gums. Regular physical activity. Eating a healthy diet. Avoiding tobacco and drug use. Limiting alcohol use. Practicing safe sex. Taking low-dose aspirin every day. Taking vitamin and mineral supplements as recommended by your health care provider. What happens during an annual well check? The services and screenings done by your health care provider during your annual well check will depend on your age, overall health, lifestyle risk factors, and family history of disease. Counseling  Your health care provider may ask you questions about your: Alcohol use. Tobacco use. Drug use. Emotional well-being. Home and relationship well-being. Sexual activity. Eating habits. History of falls. Memory and ability to understand (cognition). Work and work Astronomer. Reproductive health. Screening  You may have the following tests or measurements: Height, weight, and BMI. Blood pressure. Lipid and cholesterol levels. These may be checked every 5 years, or more frequently if you are over 17 years old. Skin check. Lung cancer screening. You may have this screening every year starting at age 28 if you have a 30-pack-year history of smoking and currently smoke or have quit within the past 15 years. Fecal occult blood test (FOBT) of the stool. You may have this test every  year starting at age 61. Flexible sigmoidoscopy or colonoscopy. You may have a sigmoidoscopy every 5 years or a colonoscopy every 10 years starting at age 37. Hepatitis C blood test. Hepatitis B blood test. Sexually transmitted disease (STD) testing. Diabetes screening. This is done by checking your blood sugar (glucose) after you have not eaten for a while (fasting). You may have this done every 1-3 years. Bone density scan. This is done to screen for osteoporosis. You may have this done starting at age 75. Mammogram. This may be done every 1-2 years. Talk to your health care provider about how often you should have regular mammograms. Talk with your health care provider about your test results, treatment options, and if necessary, the need for more tests. Vaccines  Your health care provider may recommend certain vaccines, such as: Influenza vaccine. This is recommended every year. Tetanus, diphtheria, and acellular pertussis (Tdap, Td) vaccine. You may need a Td booster every 10 years. Zoster vaccine. You may need this after age 25. Pneumococcal 13-valent conjugate (PCV13) vaccine. One dose is recommended after age 62. Pneumococcal polysaccharide (PPSV23) vaccine. One dose is recommended after age 21. Talk to your health care provider about which screenings and vaccines you need and how often you need them. This information is not intended to replace advice given to you by your health care provider. Make sure you discuss any questions you have with your health care provider. Document Released: 12/09/2015 Document Revised: 08/01/2016 Document Reviewed: 09/13/2015 Elsevier Interactive Patient Education  2017 [employer].  Fall Prevention in the Home Falls can cause  injuries. They can happen to people of all ages. There are many things you can do to make your home safe and to help prevent falls. What can I do on the outside of my home? Regularly fix the edges of walkways and driveways and  fix any cracks. Remove anything that might make you trip as you walk through a door, such as a raised step or threshold. Trim any bushes or trees on the path to your home. Use bright outdoor lighting. Clear any walking paths of anything that might make someone trip, such as rocks or tools. Regularly check to see if handrails are loose or broken. Make sure that both sides of any steps have handrails. Any raised decks and porches should have guardrails on the edges. Have any leaves, snow, or ice cleared regularly. Use sand or salt on walking paths during winter. Clean up any spills in your garage right away. This includes oil or grease spills. What can I do in the bathroom? Use night lights. Install grab bars by the toilet and in the tub and shower. Do not use towel bars as grab bars. Use non-skid mats or decals in the tub or shower. If you need to sit down in the shower, use a plastic, non-slip stool. Keep the floor dry. Clean up any water that spills on the floor as soon as it happens. Remove soap buildup in the tub or shower regularly. Attach bath mats securely with double-sided non-slip rug tape. Do not have throw rugs and other things on the floor that can make you trip. What can I do in the bedroom? Use night lights. Make sure that you have a light by your bed that is easy to reach. Do not use any sheets or blankets that are too big for your bed. They should not hang down onto the floor. Have a firm chair that has side arms. You can use this for support while you get dressed. Do not have throw rugs and other things on the floor that can make you trip. What can I do in the kitchen? Clean up any spills right away. Avoid walking on wet floors. Keep items that you use a lot in easy-to-reach places. If you need to reach something above you, use a strong step stool that has a grab bar. Keep electrical cords out of the way. Do not use floor polish or wax that makes floors slippery. If you  must use wax, use non-skid floor wax. Do not have throw rugs and other things on the floor that can make you trip. What can I do with my stairs? Do not leave any items on the stairs. Make sure that there are handrails on both sides of the stairs and use them. Fix handrails that are broken or loose. Make sure that handrails are as long as the stairways. Check any carpeting to make sure that it is firmly attached to the stairs. Fix any carpet that is loose or worn. Avoid having throw rugs at the top or bottom of the stairs. If you do have throw rugs, attach them to the floor with carpet tape. Make sure that you have a light switch at the top of the stairs and the bottom of the stairs. If you do not have them, ask someone to add them for you. What else can I do to help prevent falls? Wear shoes that: Do not have high heels. Have rubber bottoms. Are comfortable and fit you well. Are closed at the toe. Do  not wear sandals. If you use a stepladder: Make sure that it is fully opened. Do not climb a closed stepladder. Make sure that both sides of the stepladder are locked into place. Ask someone to hold it for you, if possible. Clearly mark and make sure that you can see: Any grab bars or handrails. First and last steps. Where the edge of each step is. Use tools that help you move around (mobility aids) if they are needed. These include: Canes. Walkers. Scooters. Crutches. Turn on the lights when you go into a dark area. Replace any light bulbs as soon as they burn out. Set up your furniture so you have a clear path. Avoid moving your furniture around. If any of your floors are uneven, fix them. If there are any pets around you, be aware of where they are. Review your medicines with your doctor. Some medicines can make you feel dizzy. This can increase your chance of falling. Ask your doctor what other things that you can do to help prevent falls. This information is not intended to replace  advice given to you by your health care provider. Make sure you discuss any questions you have with your health care provider. Document Released: 09/08/2009 Document Revised: 04/19/2016 Document Reviewed: 12/17/2014 Elsevier Interactive Patient Education  2017 [employer]

## 2023-03-05 NOTE — Progress Notes (Signed)
Subjective:   Yvonne [name] is a 72 y.o. female who presents for Medicare Annual (Subsequent) preventive examination. I connected with  BOBI DAUDELIN on 03/05/23 by a audio enabled telemedicine application and verified that I am speaking with the correct person using two identifiers.  Patient Location: Home  Provider Location: Home Office  I discussed the limitations of evaluation and management by telemedicine. The patient expressed understanding and agreed to proceed.  Review of Systems     Cardiac Risk Factors include: advanced age (>51men, >37 women);diabetes mellitus;hypertension;dyslipidemia     Objective:    Today's Vitals   03/05/23 1114  Weight: 158 lb (71.7 kg)  Height: 5\' 2"  (1.575 m)   Body mass index is 28.9 kg/m.     03/05/2023   11:18 AM 03/01/2022   11:30 AM 02/01/2022    9:08 AM 01/23/2022    2:57 PM 01/16/2022    9:00 PM 01/16/2022    4:57 PM 02/28/2021   11:01 AM  Advanced Directives  Does Patient Have a Medical Advance Directive? No No No No No No No  Would patient like information on creating a medical advance directive? No - Patient declined No - Patient declined  No - Patient declined No - Patient declined No - Patient declined Yes (MAU/Ambulatory/Procedural Areas - Information given)    Current Medications (verified) Outpatient Encounter Medications as of 03/05/2023  Medication Sig   ACCU-CHEK GUIDE test strip TEST BLOOD SUGAR 4 TIMES DAILY DX E11.69   Accu-Chek Softclix Lancets lancets Test BS up to 4 times daily Dx E11.69   albuterol (VENTOLIN HFA) 108 (90 Base) MCG/ACT inhaler TAKE 2 PUFFS BY MOUTH EVERY 6 HOURS AS NEEDED FOR WHEEZE OR SHORTNESS OF BREATH   alendronate (FOSAMAX) 70 MG tablet TAKE 1 TABLET EVERY 7 DAYS WITH A FULL GLASS OF WATER ON AN EMPTY STOMACH   ALPRAZolam (XANAX) 1 MG tablet ! Each morning, one each evening, and 1/2 each afternoon   amLODipine (NORVASC) 10 MG tablet TAKE 1 TABLET BY MOUTH EVERY DAY   atorvastatin (LIPITOR) 80 MG  tablet Take 1 tablet (80 mg total) by mouth daily.   Bempedoic Acid-Ezetimibe (NEXLIZET) 180-10 MG TABS Take 1 tablet by mouth daily. Take 180 mg by mouth daily.   Blood Glucose Monitoring Suppl (ACCU-CHEK GUIDE) w/Device KIT Test Bs QID Dx E11.69   clopidogrel (PLAVIX) 75 MG tablet TAKE 1 TABLET BY MOUTH EVERY DAY WITH BREAKFAST   conjugated estrogens (PREMARIN) vaginal cream Use 0.5 gm in vagina nightly for 1 week then 2 x weekly   dapagliflozin propanediol (FARXIGA) 10 MG TABS tablet Take 1 tablet (10 mg total) by mouth daily.   diclofenac Sodium (VOLTAREN) 1 % GEL Apply 4 g topically 4 (four) times daily.   famotidine (PEPCID) 20 MG tablet Take 1 tablet (20 mg total) by mouth at bedtime.   furosemide (LASIX) 20 MG tablet TAKE 1 TABLET BY MOUTH EVERY DAY   glipiZIDE (GLUCOTROL) 5 MG tablet Take 1 tablet (5 mg total) by mouth daily before breakfast.   naproxen (NAPROSYN) 500 MG tablet Take 1 tablet (500 mg total) by mouth 2 (two) times daily with a meal.   nitroGLYCERIN (NITROSTAT) 0.4 MG SL tablet Place 1 tablet (0.4 mg total) under the tongue every 5 (five) minutes as needed for chest pain.   oxybutynin (DITROPAN-XL) 5 MG 24 hr tablet Take 1 tablet (5 mg total) by mouth at bedtime.   pantoprazole (PROTONIX) 40 MG tablet Take 1 tablet (40  mg total) by mouth daily. For stomach   promethazine-dextromethorphan (PROMETHAZINE-DM) 6.25-15 MG/5ML syrup Take 2.5 mLs by mouth at bedtime as needed for cough.   tirzepatide [facility]) 5 MG/0.5ML Pen Inject 5 mg into the skin once a week.   triamcinolone ointment (KENALOG) 0.5 % Apply 1 application. topically 2 (two) times daily.   vitamin B-12 (CYANOCOBALAMIN) 500 MCG tablet Take 1 tablet (500 mcg total) by mouth daily.   Facility-Administered Encounter Medications as of 03/05/2023  Medication   promethazine (PHENERGAN) injection 25 mg    Allergies (verified) Penicillins, Metformin and related, Gabapentin, Lisinopril, and Soap   History: Past  Medical History:  Diagnosis Date   (HFpEF) heart failure with preserved ejection fraction    Anxiety    Aortic atherosclerosis    Arthritis    left hand   Asthma    daily and prn inhalers   CAD (coronary artery disease)    Carotid artery disease    Chronic cough    Chronic otitis media 12/2017   Full dentures    GERD (gastroesophageal reflux disease)    History of stroke 09/2017   weakness right hand, numbness right side face   Hyperlipidemia    Hypertension    states under control with meds., has been on med. x a long time, per pt.   Non-insulin dependent type 2 diabetes mellitus    Orthostatic hypotension    Overactive bladder    PSVT (paroxysmal supraventricular tachycardia)    Recurrent acute suppurative otitis media without spontaneous rupture of left tympanic membrane 02/19/2018   Stroke    Transient cerebral ischemia    Past Surgical History:  Procedure Laterality Date   ABDOMINAL HYSTERECTOMY     complete   CATARACT EXTRACTION W/ INTRAOCULAR LENS IMPLANT Right    CHOLECYSTECTOMY     COLONOSCOPY N/A 08/17/2015   Procedure: COLONOSCOPY;  Surgeon: Malissa Hippo, MD;  Location: [facility];  Service: Endoscopy;  Laterality: N/A;  200 - moved to 7:30 - Ann notified pt   GAS INSERTION Right    x 2 - eye   LACRIMAL DUCT EXPLORATION Bilateral    removal of tear ducts   MYRINGOTOMY WITH TUBE PLACEMENT Bilateral 01/28/2018   Procedure: BILATERAL MYRINGOTOMY WITH TUBE PLACEMENT;  Surgeon: Newman Pies, MD;  Location: [facility];  Service: ENT;  Laterality: Bilateral;   VITRECTOMY Left 09/14/2015   Family History  Problem Relation Age of Onset   Breast cancer Mother    Arthritis Mother    Diabetes Mother    CVA Mother    Stroke Mother    Diabetes Father    Heart disease Father        CABG.  Does not know age of onset   Asthma Father    Hepatitis C Sister    Peripheral Artery Disease Daughter    Arthritis Brother    Cancer Brother        metastic  cancer   Arthritis-Osteo Son    Social History   Socioeconomic History   Marital status: Married    Spouse name: Not on file   Number of children: 3   Years of education: Not on file   Highest education level: GED or equivalent  Occupational History   Occupation: retired    Comment: Producer, television/film/video and restaurants  Tobacco Use   Smoking status: Never   Smokeless tobacco: Never  Vaping Use   Vaping Use: Never used  Substance and Sexual Activity   Alcohol  use: Not Currently   Drug use: No   Sexual activity: Yes    Birth control/protection: Surgical    Comment: hyst  Other Topics Concern   Not on file  Social History Narrative   Lives at home home with husband with    Son, daughter in law and 3 children.  Lucila Maine, his girlfriend and her child also moved in 10/2018      Disabled.   Social Determinants of Health   Financial Resource Strain: Low Risk  (03/05/2023)   Overall Financial Resource Strain (CARDIA)    Difficulty of Paying Living Expenses: Not hard at all  Food Insecurity: No Food Insecurity (03/05/2023)   Hunger Vital Sign    Worried About Running Out of Food in the Last Year: Never true    Ran Out of Food in the Last Year: Never true  Transportation Needs: No Transportation Needs (03/05/2023)   PRAPARE - Administrator, Civil Service (Medical): No    Lack of Transportation (Non-Medical): No  Physical Activity: Insufficiently Active (03/05/2023)   Exercise Vital Sign    Days of Exercise per Week: 3 days    Minutes of Exercise per Session: 30 min  Stress: No Stress Concern Present (03/05/2023)   [employer] of Occupational Health - Occupational Stress Questionnaire    Feeling of Stress : Not at all  Social Connections: Moderately Integrated (03/05/2023)   Social Connection and Isolation Panel [NHANES]    Frequency of Communication with Friends and Family: More than three times a week    Frequency of Social Gatherings with Friends and Family: More  than three times a week    Attends Religious Services: Never    Database administrator or Organizations: Yes    Attends Engineer, structural: More than 4 times per year    Marital Status: Married    Tobacco Counseling Counseling given: Not Answered   Clinical Intake:  Pre-visit preparation completed: Yes  Pain : No/denies pain     Nutritional Risks: None Diabetes: Yes CBG done?: No Did pt. bring in CBG monitor from home?: No  How often do you need to have someone help you when you read instructions, pamphlets, or other written materials from your doctor or pharmacy?: 1 - Never  Diabetic?yes  Nutrition Risk Assessment:  Has the patient had any N/V/D within the last 2 months?  No  Does the patient have any non-healing wounds?  No  Has the patient had any unintentional weight loss or weight gain?  No   Diabetes:  Is the patient diabetic?  Yes  If diabetic, was a CBG obtained today?  No  Did the patient bring in their glucometer from home?  No  How often do you monitor your CBG's? 3 x day .   Financial Strains and Diabetes Management:  Are you having any financial strains with the device, your supplies or your medication? No .  Does the patient want to be seen by Chronic Care Management for management of their diabetes?  No  Would the patient like to be referred to a Nutritionist or for Diabetic Management?  No   Diabetic Exams:  Diabetic Eye Exam: Completed 08/2022 Diabetic Foot Exam: Overdue, Pt has been advised about the importance in completing this exam. Pt is scheduled for diabetic foot exam on next office visit .   Interpreter Needed?: No  Information entered by :: Renie Ora, LPN   Activities of Daily Living    03/05/2023  11:18 AM  In your present state of health, do you have any difficulty performing the following activities:  Hearing? 0  Vision? 0  Difficulty concentrating or making decisions? 0  Walking or climbing stairs? 0   Dressing or bathing? 0  Doing errands, shopping? 0  Preparing Food and eating ? N  Using the Toilet? N  In the past six months, have you accidently leaked urine? N  Do you have problems with loss of bowel control? N  Managing your Medications? N  Managing your Finances? N  Housekeeping or managing your Housekeeping? N    Patient Care Team: Mechele ClaudeStacks, Warren, MD as PCP - General (Family Medicine) Drema DallasJaffe, Adam R, DO as Consulting Physician (Neurology) Randa SpikeForrest, Kelton PillarMichael S, [name] as Social Worker (Licensed Clinical Social Worker) Cresenciano GenrePruitt, Lilla ShookJulie D, [facility] Surgery CenterRPH (Pharmacist) Christell Constanthandrasekhar, Mahesh A, MD as Consulting Physician (Cardiology) Michaelle CopasLe, Yen Thi Hong, MD as Referring Physician (Optometry)  Indicate any recent Medical Services you may have received from other than [facility] providers in the past year (date may be approximate).     Assessment:   This is a routine wellness examination for Yvonne NixonJanice.  Hearing/Vision screen Vision Screening - Comments:: Wears rx glasses - up to date with routine eye exams with  Dr.Johnson  Dietary issues and exercise activities discussed: Current Exercise Habits: Home exercise routine, Type of exercise: walking, Time (Minutes): 30, Frequency (Times/Week): 3, Weekly Exercise (Minutes/Week): 90, Intensity: Mild, Exercise limited by: None identified   Goals Addressed             This Visit's Progress    DIET - INCREASE WATER INTAKE         Depression Screen    03/05/2023   11:17 AM 02/06/2023    9:24 AM 02/06/2023    9:21 AM 11/23/2022    8:51 AM 11/16/2022    9:08 AM 11/07/2022   11:07 AM 11/07/2022   11:01 AM  PHQ 2/9 Scores  PHQ - 2 Score 0 5 0 5 5 5  0  PHQ- 9 Score 0 18  19 21 17      Fall Risk    03/05/2023   11:15 AM 02/06/2023    9:23 AM 02/06/2023    9:21 AM 11/23/2022    8:56 AM 11/16/2022    9:08 AM  Fall Risk   Falls in the past year? 0 1 0 0 1  Number falls in past yr: 0 0  0 0  Injury with Fall? 0 0  0 0  Risk for fall due to : No Fall Risks  History of fall(s)  No Fall Risks History of fall(s)  Follow up Falls prevention discussed Falls evaluation completed  Falls evaluation completed Education provided    FALL RISK PREVENTION PERTAINING TO THE HOME:  Any stairs in or around the home? Yes  If so, are there any without handrails? No  Home free of loose throw rugs in walkways, pet beds, electrical cords, etc? Yes  Adequate lighting in your home to reduce risk of falls? No   ASSISTIVE DEVICES UTILIZED TO PREVENT FALLS:  Life alert? No  Use of a cane, walker or w/c? No  Grab bars in the bathroom? No  Shower chair or bench in shower? No  Elevated toilet seat or a handicapped toilet? No       12/02/2018    9:49 AM 06/06/2017    8:37 AM 07/20/2015   10:10 AM  MMSE - Mini Mental State Exam  Orientation to time 5 5 5  Orientation to Place 5 5 5   Registration 3 3 3   Attention/ Calculation 5 5 5   Recall 3 3 3   Language- name 2 objects 2 2 2   Language- repeat 1 1 1   Language- follow 3 step command 3 3 3   Language- read & follow direction 1 1 1   Write a sentence 1 1 1   Copy design 1 1 1   Total score 30 30 30         03/05/2023   11:19 AM 03/01/2022   11:26 AM 01/18/2020   11:07 AM  6CIT Screen  What Year? 0 points 0 points 0 points  What month? 0 points 0 points 0 points  What time? 0 points 0 points 0 points  Count back from 20 0 points 0 points 0 points  Months in reverse 0 points 0 points 0 points  Repeat phrase 0 points 4 points 0 points  Total Score 0 points 4 points 0 points    Immunizations Immunization History  Administered Date(s) Administered   Fluad Quad(high Dose 65+) 08/30/2021   Influenza Split 08/18/2015   Influenza,inj,Quad PF,6+ Mos 10/22/2018   Influenza-Unspecified 09/14/2013   Moderna Sars-Covid-2 Vaccination 06/14/2020, 07/12/2020   PNEUMOCOCCAL CONJUGATE-20 04/17/2022   Pneumococcal Conjugate-13 01/06/2015   Pneumococcal Polysaccharide-23 10/22/2018   Tdap 06/06/2017, 05/22/2021   Zoster  Recombinat (Shingrix) 06/06/2017, 02/26/2018    TDAP status: Up to date  Flu Vaccine status: Up to date  Pneumococcal vaccine status: Up to date  Covid-19 vaccine status: Completed vaccines  Qualifies for Shingles Vaccine? Yes   Zostavax completed Yes   Shingrix Completed?: Yes  Screening Tests Health Maintenance  Topic Date Due   COVID-19 Vaccine (3 - Moderna risk series) 08/09/2020   FOOT EXAM  08/07/2022   DEXA SCAN  03/09/2023   MAMMOGRAM  06/19/2023   INFLUENZA VACCINE  06/27/2023   HEMOGLOBIN A1C  08/09/2023   OPHTHALMOLOGY EXAM  09/21/2023   Diabetic kidney evaluation - Urine ACR  11/08/2023   Diabetic kidney evaluation - eGFR measurement  02/06/2024   Medicare Annual Wellness (AWV)  03/04/2024   COLONOSCOPY (Pts 45-22yrs Insurance coverage will need to be confirmed)  12/21/2029   DTaP/Tdap/Td (3 - Td or Tdap) 05/23/2031   Pneumonia Vaccine 42+ Years old  Completed   Hepatitis C Screening  Completed   Zoster Vaccines- Shingrix  Completed   HPV VACCINES  Aged Out   COLON CANCER SCREENING ANNUAL FOBT  Discontinued    Health Maintenance  Health Maintenance Due  Topic Date Due   COVID-19 Vaccine (3 - Moderna risk series) 08/09/2020   FOOT EXAM  08/07/2022    Colorectal cancer screening: Type of screening: Colonoscopy. Completed 12/21/2022. Repeat every 7 years  Mammogram status: Completed 06/18/2022. Repeat every year  Bone Density status: Ordered 03/05/2023. Pt provided with contact info and advised to call to schedule appt.  Lung Cancer Screening: (Low Dose CT Chest recommended if Age 53-80 years, 30 pack-year currently smoking OR have quit w/in 15years.) does not qualify.   Lung Cancer Screening Referral: n/a  Additional Screening:  Hepatitis C Screening: does not qualify; Completed 07/20/2015  Vision Screening: Recommended annual ophthalmology exams for early detection of glaucoma and other disorders of the eye. Is the patient up to date with their  annual eye exam?  Yes  Who is the provider or what is the name of the office in which the patient attends annual eye exams? Dr.Johnson  If pt is not established with a provider, would they like  to be referred to a provider to establish care? No .   Dental Screening: Recommended annual dental exams for proper oral hygiene  Community Resource Referral / Chronic Care Management: CRR required this visit?  No   CCM required this visit?  No      Plan:     I have personally reviewed and noted the following in the patient's chart:   Medical and social history Use of alcohol, tobacco or illicit drugs  Current medications and supplements including opioid prescriptions. Patient is not currently taking opioid prescriptions. Functional ability and status Nutritional status Physical activity Advanced directives List of other physicians Hospitalizations, surgeries, and ER visits in previous 12 months Vitals Screenings to include cognitive, depression, and falls Referrals and appointments  In addition, I have reviewed and discussed with patient certain preventive protocols, quality metrics, and best practice recommendations. A written personalized care plan for preventive services as well as general preventive health recommendations were provided to patient.     Lorrene Reid, LPN   12/27/1153   Nurse Notes: none

## 2023-03-07 ENCOUNTER — Encounter: Payer: Self-pay | Admitting: Nurse Practitioner

## 2023-03-07 ENCOUNTER — Ambulatory Visit (INDEPENDENT_AMBULATORY_CARE_PROVIDER_SITE_OTHER): Payer: Medicare PPO | Admitting: Nurse Practitioner

## 2023-03-07 VITALS — BP 145/78 | HR 59 | Temp 97.2°F | Resp 20 | Ht 62.0 in | Wt 158.0 lb

## 2023-03-07 DIAGNOSIS — W57XXXA Bitten or stung by nonvenomous insect and other nonvenomous arthropods, initial encounter: Secondary | ICD-10-CM | POA: Diagnosis not present

## 2023-03-07 DIAGNOSIS — S80861A Insect bite (nonvenomous), right lower leg, initial encounter: Secondary | ICD-10-CM

## 2023-03-07 MED ORDER — DOXYCYCLINE HYCLATE 100 MG PO TABS: 100.0000 mg | ORAL_TABLET | Freq: Two times a day (BID) | ORAL | 0 refills | Status: DC

## 2023-03-07 NOTE — Progress Notes (Signed)
Subjective:    Patient ID: Yvonne [name], female    DOB: 05/26/1951, 72 y.o.   MRN: [id number]  Chief Complaint: tick bite  HPI Patient found several ticks on bil lower ext this past weekend. Has knots in area where she removed hem. No fever, fatigue or headache.  Patient Active Problem List   Diagnosis Date Noted   S/P hysterectomy with oophorectomy 11/23/2022   SUI (stress urinary incontinence, female) 11/23/2022   Dyspareunia in female 11/23/2022   Depression, major, single episode, severe 04/10/2022   Peripheral edema 04/10/2022   History of stroke 01/24/2022   Coronary artery disease involving native coronary artery of native heart without angina pectoris 01/24/2022   TIA (transient ischemic attack) 01/18/2022   Uncontrolled type 2 diabetes mellitus with hyperglycemia, without long-term current use of insulin 01/17/2022   Sensory disturbance 01/17/2022   Syncope and collapse 01/16/2022   Restless legs 02/27/2021   Insomnia 02/27/2021   Polyneuropathy due to type 2 diabetes mellitus 02/27/2021   Palpitations 01/20/2021   Aortic atherosclerosis 01/20/2021   Orthostatic hypotension 01/20/2021   Carpal tunnel syndrome 08/23/2020   Diabetic lipidosis 09/09/2019   Hypokalemia 12/11/2018   Benzodiazepine dependence, continuous 10/22/2018   Chronic migraine without aura without status migrainosus, not intractable 02/19/2018   Chronic diastolic CHF (congestive heart failure) 03/21/2017   Chronic bilateral low back pain with bilateral sciatica 02/27/2017   Spondylosis of lumbar region without myelopathy or radiculopathy 08/15/2016   Pain of both hip joints 06/18/2016   Pain syndrome, chronic 06/18/2016   Chronic cough 05/17/2016   HLD (hyperlipidemia) 11/04/2015   Macular hole of right eye 09/14/2015   Obesity 07/20/2015   Osteopenia 07/20/2015   Precordial pain 07/06/2015   Overactive bladder    Hypertension    GAD (generalized anxiety disorder)    Acid reflux disease  08/17/2014       Review of Systems  Constitutional:  Negative for diaphoresis.  Eyes:  Negative for pain.  Respiratory:  Negative for shortness of breath.   Cardiovascular:  Negative for chest pain, palpitations and leg swelling.  Gastrointestinal:  Negative for abdominal pain.  Endocrine: Negative for polydipsia.  Skin:  Negative for rash.  Neurological:  Negative for dizziness, weakness and headaches.  Hematological:  Does not bruise/bleed easily.  All other systems reviewed and are negative.      Objective:   Physical Exam Constitutional:      Appearance: Normal appearance.  Cardiovascular:     Rate and Rhythm: Normal rate and regular rhythm.     Heart sounds: Normal heart sounds.  Pulmonary:     Effort: Pulmonary effort is normal.     Breath sounds: Normal breath sounds.  Abdominal:     Palpations: Abdomen is soft.  Skin:    General: Skin is warm.     Comments: 2 small bite areas scabbed over on right lower leg- no erythema but are indurated and sore to touch. No drainage.  Neurological:     Mental Status: She is alert.    BP (!) 145/78   Pulse (!) 59   Temp (!) 97.2 F (36.2 C) (Temporal)   Resp 20   Ht 5\' 2"  (1.575 m)   Wt 158 lb (71.7 kg)   SpO2 97%   BMI 28.90 kg/m         Assessment & Plan:   Yvonne [name] in today with chief complaint of tick bites (3 on legs and now there is a knot/) and  Ear Pain (Left/)   1. Tick bite of right lower leg, initial encounter Avoid scratching or picking at areas RTO prn - doxycycline (VIBRA-TABS) 100 MG tablet; Take 1 tablet (100 mg total) by mouth 2 (two) times daily. 1 po bid  Dispense: 28 tablet; Refill: 0    The above assessment and management plan was discussed with the patient. The patient verbalized understanding of and has agreed to the management plan. Patient is aware to call the clinic if symptoms persist or worsen. Patient is aware when to return to the clinic for a follow-up visit. Patient  educated on when it is appropriate to go to the emergency department.   [name], FNP

## 2023-03-25 ENCOUNTER — Telehealth: Payer: Self-pay | Admitting: Family Medicine

## 2023-03-25 NOTE — Telephone Encounter (Signed)
Patient had a recent change of dosage on Mounjaro, she thinks it needs to be changed again. Aware she may need an appt but she wanted a message put in for nurse first. Please call back and advise.

## 2023-03-25 NOTE — Telephone Encounter (Signed)
No answer, mailbox full

## 2023-04-01 NOTE — Telephone Encounter (Signed)
PATIENT REPORTS AT 5 MG BLOOD SUGAR IS GOING BACK UP. WANT TO CHANGE TO 7.5 MG

## 2023-04-01 NOTE — Telephone Encounter (Signed)
Spoke with patient, patient given an appointment for this week

## 2023-04-01 NOTE — Telephone Encounter (Signed)
Lets do an office visit for this, please.

## 2023-04-01 NOTE — Telephone Encounter (Signed)
Patient return call. ?

## 2023-04-03 ENCOUNTER — Encounter: Payer: Self-pay | Admitting: Family Medicine

## 2023-04-03 ENCOUNTER — Ambulatory Visit (INDEPENDENT_AMBULATORY_CARE_PROVIDER_SITE_OTHER): Payer: Medicare PPO | Admitting: Family Medicine

## 2023-04-03 VITALS — BP 136/78 | HR 67 | Temp 97.9°F | Ht 62.0 in | Wt 156.2 lb

## 2023-04-03 DIAGNOSIS — Z7985 Long-term (current) use of injectable non-insulin antidiabetic drugs: Secondary | ICD-10-CM

## 2023-04-03 DIAGNOSIS — Z7984 Long term (current) use of oral hypoglycemic drugs: Secondary | ICD-10-CM

## 2023-04-03 DIAGNOSIS — E1169 Type 2 diabetes mellitus with other specified complication: Secondary | ICD-10-CM | POA: Diagnosis not present

## 2023-04-03 DIAGNOSIS — E756 Lipid storage disorder, unspecified: Secondary | ICD-10-CM

## 2023-04-03 MED ORDER — TIRZEPATIDE 7.5 MG/0.5ML ~~LOC~~ SOAJ: 7.5000 mg | SUBCUTANEOUS | 3 refills | Status: DC

## 2023-04-03 NOTE — Progress Notes (Signed)
Subjective:  Patient ID: Yvonne [name], female    DOB: [birth year]  Age: 72 y.o. MRN: [id number]  CC: No chief complaint on file.   HPI Yvonne [name] presents forFollow-up of diabetes. Patient checks blood sugar at home.   180 - 200 fasting and 200 - 210 postprandial Patient denies symptoms such as polyuria, polydipsia, excessive hunger, nausea.  No significant hypoglycemic spells noted. Medications reviewed.  No side effects on Mounjaro 5 mg, at 10 mg had nausea and diarrhea. Cut back 2 months ago when A1c was 5.9. Since then glucose  has steadily climbed. Continues nexizet. Well tolerated.  History Cenedra has a past medical history of (HFpEF) heart failure with preserved ejection fraction (HCC), Anxiety, Aortic atherosclerosis (HCC), Arthritis, Asthma, CAD (coronary artery disease), Carotid artery disease (HCC), Chronic cough, Chronic otitis media (12/2017), Full dentures, GERD (gastroesophageal reflux disease), History of stroke (09/2017), Hyperlipidemia, Hypertension, Non-insulin dependent type 2 diabetes mellitus (HCC), Orthostatic hypotension, Overactive bladder, PSVT (paroxysmal supraventricular tachycardia), Recurrent acute suppurative otitis media without spontaneous rupture of left tympanic membrane (02/19/2018), Stroke (HCC), and Transient cerebral ischemia.   She has a past surgical history that includes Cholecystectomy; Colonoscopy (N/A, 08/17/2015); Abdominal hysterectomy; Lacrimal duct exploration (Bilateral); Cataract extraction w/ intraocular lens implant (Right); Vitrectomy (Left, 09/14/2015); Gas insertion (Right); and Myringotomy with tube placement (Bilateral, 01/28/2018).   Her family history includes Arthritis in her brother and mother; Arthritis-Osteo in her son; Asthma in her father; Breast cancer in her mother; CVA in her mother; Cancer in her brother; Diabetes in her father and mother; Heart disease in her father; Hepatitis C in her sister; Peripheral Artery Disease in her  daughter; Stroke in her mother.She reports that she has never smoked. She has never used smokeless tobacco. She reports that she does not currently use alcohol. She reports that she does not use drugs.  Current Outpatient Medications on File Prior to Visit  Medication Sig Dispense Refill   ACCU-CHEK GUIDE test strip TEST BLOOD SUGAR 4 TIMES DAILY DX E11.69 400 strip 3   Accu-Chek Softclix Lancets lancets Test BS up to 4 times daily Dx E11.69 400 each 3   albuterol (VENTOLIN HFA) 108 (90 Base) MCG/ACT inhaler TAKE 2 PUFFS BY MOUTH EVERY 6 HOURS AS NEEDED FOR WHEEZE OR SHORTNESS OF BREATH 54 each 1   alendronate (FOSAMAX) 70 MG tablet TAKE 1 TABLET EVERY 7 DAYS WITH A FULL GLASS OF WATER ON AN EMPTY STOMACH 12 tablet 0   ALPRAZolam (XANAX) 1 MG tablet ! Each morning, one each evening, and 1/2 each afternoon 75 tablet 5   amLODipine (NORVASC) 10 MG tablet TAKE 1 TABLET BY MOUTH EVERY DAY 90 tablet 3   atorvastatin (LIPITOR) 80 MG tablet Take 1 tablet (80 mg total) by mouth daily. 90 tablet 2   Bempedoic Acid-Ezetimibe (NEXLIZET) 180-10 MG TABS Take 1 tablet by mouth daily. Take 180 mg by mouth daily. 90 tablet 3   Blood Glucose Monitoring Suppl (ACCU-CHEK GUIDE) w/Device KIT Test Bs QID Dx E11.69 1 kit 0   clopidogrel (PLAVIX) 75 MG tablet TAKE 1 TABLET BY MOUTH EVERY DAY WITH BREAKFAST 90 tablet 3   conjugated estrogens (PREMARIN) vaginal cream Use 0.5 gm in vagina nightly for 1 week then 2 x weekly 12 g 0   dapagliflozin propanediol (FARXIGA) 10 MG TABS tablet Take 1 tablet (10 mg total) by mouth daily. 90 tablet 5   diclofenac Sodium (VOLTAREN) 1 % GEL Apply 4 g topically 4 (four) times daily. 350  g 0   doxycycline (VIBRA-TABS) 100 MG tablet Take 1 tablet (100 mg total) by mouth 2 (two) times daily. 1 po bid 28 tablet 0   famotidine (PEPCID) 20 MG tablet Take 1 tablet (20 mg total) by mouth at bedtime. 90 tablet 3   furosemide (LASIX) 20 MG tablet TAKE 1 TABLET BY MOUTH EVERY DAY 90 tablet 1    glipiZIDE (GLUCOTROL) 5 MG tablet Take 1 tablet (5 mg total) by mouth daily before breakfast. 90 tablet 3   naproxen (NAPROSYN) 500 MG tablet Take 1 tablet (500 mg total) by mouth 2 (two) times daily with a meal. 60 tablet 1   nitroGLYCERIN (NITROSTAT) 0.4 MG SL tablet Place 1 tablet (0.4 mg total) under the tongue every 5 (five) minutes as needed for chest pain. 25 tablet 10   oxybutynin (DITROPAN-XL) 5 MG 24 hr tablet Take 1 tablet (5 mg total) by mouth at bedtime. 90 tablet 3   pantoprazole (PROTONIX) 40 MG tablet Take 1 tablet (40 mg total) by mouth daily. For stomach 30 tablet 11   promethazine-dextromethorphan (PROMETHAZINE-DM) 6.25-15 MG/5ML syrup Take 2.5 mLs by mouth at bedtime as needed for cough. 118 mL 0   triamcinolone ointment (KENALOG) 0.5 % Apply 1 application. topically 2 (two) times daily. 30 g 0   vitamin B-12 (CYANOCOBALAMIN) 500 MCG tablet Take 1 tablet (500 mcg total) by mouth daily.     Current Facility-Administered Medications on File Prior to Visit  Medication Dose Route Frequency Provider Last Rate Last Admin   promethazine (PHENERGAN) injection 25 mg  25 mg Intramuscular Q6H PRN Daphine Deutscher, Mary-Margaret, FNP   25 mg at 12/28/22 1316    ROS Review of Systems As per HPI Objective:  BP 136/78   Pulse 67   Temp 97.9 F (36.6 C)   Ht 5\' 2"  (1.575 m)   Wt 156 lb 3.2 oz (70.9 kg)   SpO2 95%   BMI 28.57 kg/m   BP Readings from Last 3 Encounters:  04/03/23 136/78  03/07/23 (!) 145/78  02/06/23 (!) 151/72    Wt Readings from Last 3 Encounters:  04/03/23 156 lb 3.2 oz (70.9 kg)  03/07/23 158 lb (71.7 kg)  03/05/23 158 lb (71.7 kg)     Physical Exam Vitals reviewed.  Constitutional:      Appearance: Normal appearance.  HENT:     Head: Normocephalic.  Musculoskeletal:        General: Normal range of motion.  Skin:    General: Skin is warm and dry.  Neurological:     General: No focal deficit present.     Mental Status: She is alert and oriented to  person, place, and time.  Psychiatric:        Mood and Affect: Mood normal.        Behavior: Behavior normal.        Thought Content: Thought content normal.        Judgment: Judgment normal.       Assessment & Plan:   Diagnoses and all orders for this visit:  Diabetic lipidosis (HCC)  Other orders -     tirzepatide (MOUNJARO) 7.5 MG/0.5ML Pen; Inject 7.5 mg into the skin once a week.      I have discontinued Lawernce Keas Sundeen's tirzepatide. I am also having her start on tirzepatide. Additionally, I am having her maintain her Accu-Chek Softclix Lancets, nitroGLYCERIN, Accu-Chek Guide, cyanocobalamin, triamcinolone ointment, oxybutynin, famotidine, diclofenac Sodium, albuterol, dapagliflozin propanediol, pantoprazole, ALPRAZolam, naproxen, furosemide, Premarin, alendronate,  promethazine-dextromethorphan, amLODipine, clopidogrel, glipiZIDE, atorvastatin, Nexlizet, Accu-Chek Guide, and doxycycline. We will continue to administer promethazine.  Meds ordered this encounter  Medications   tirzepatide (MOUNJARO) 7.5 MG/0.5ML Pen    Sig: Inject 7.5 mg into the skin once a week.    Dispense:  6 mL    Refill:  3     Follow-up: Return in about 6 weeks (around 05/15/2023).  Mechele Claude, M.D.

## 2023-04-12 ENCOUNTER — Emergency Department (HOSPITAL_COMMUNITY): Payer: Medicare PPO

## 2023-04-12 ENCOUNTER — Emergency Department (HOSPITAL_COMMUNITY)
Admission: EM | Admit: 2023-04-12 | Discharge: 2023-04-12 | Disposition: A | Discharge status: Home / Self Care | Payer: Medicare PPO | Attending: Emergency Medicine | Admitting: Emergency Medicine

## 2023-04-12 ENCOUNTER — Other Ambulatory Visit: Payer: Self-pay

## 2023-04-12 DIAGNOSIS — Z7902 Long term (current) use of antithrombotics/antiplatelets: Secondary | ICD-10-CM | POA: Diagnosis not present

## 2023-04-12 DIAGNOSIS — I110 Hypertensive heart disease with heart failure: Secondary | ICD-10-CM | POA: Insufficient documentation

## 2023-04-12 DIAGNOSIS — E119 Type 2 diabetes mellitus without complications: Secondary | ICD-10-CM | POA: Insufficient documentation

## 2023-04-12 DIAGNOSIS — E876 Hypokalemia: Secondary | ICD-10-CM | POA: Diagnosis not present

## 2023-04-12 DIAGNOSIS — Z7984 Long term (current) use of oral hypoglycemic drugs: Secondary | ICD-10-CM | POA: Diagnosis not present

## 2023-04-12 DIAGNOSIS — R200 Anesthesia of skin: Secondary | ICD-10-CM | POA: Insufficient documentation

## 2023-04-12 DIAGNOSIS — Z79899 Other long term (current) drug therapy: Secondary | ICD-10-CM | POA: Diagnosis not present

## 2023-04-12 DIAGNOSIS — Z7982 Long term (current) use of aspirin: Secondary | ICD-10-CM | POA: Insufficient documentation

## 2023-04-12 DIAGNOSIS — R202 Paresthesia of skin: Secondary | ICD-10-CM

## 2023-04-12 DIAGNOSIS — R531 Weakness: Secondary | ICD-10-CM | POA: Insufficient documentation

## 2023-04-12 DIAGNOSIS — I2510 Atherosclerotic heart disease of native coronary artery without angina pectoris: Secondary | ICD-10-CM | POA: Insufficient documentation

## 2023-04-12 DIAGNOSIS — R29818 Other symptoms and signs involving the nervous system: Secondary | ICD-10-CM | POA: Diagnosis not present

## 2023-04-12 DIAGNOSIS — I509 Heart failure, unspecified: Secondary | ICD-10-CM | POA: Diagnosis not present

## 2023-04-12 LAB — DIFFERENTIAL
Abs Immature Granulocytes: 0.02 10*3/uL (ref 0.00–0.07)
Basophils Absolute: 0 10*3/uL (ref 0.0–0.1)
Basophils Relative: 1 %
Eosinophils Absolute: 0.1 10*3/uL (ref 0.0–0.5)
Eosinophils Relative: 1 %
Immature Granulocytes: 0 %
Lymphocytes Relative: 42 %
Lymphs Abs: 2.7 10*3/uL (ref 0.7–4.0)
Monocytes Absolute: 0.4 10*3/uL (ref 0.1–1.0)
Monocytes Relative: 7 %
Neutro Abs: 3.1 10*3/uL (ref 1.7–7.7)
Neutrophils Relative %: 49 %

## 2023-04-12 LAB — CBC
HCT: 37 % (ref 36.0–46.0)
Hemoglobin: 12.9 g/dL (ref 12.0–15.0)
MCH: 30.1 pg (ref 26.0–34.0)
MCHC: 34.9 g/dL (ref 30.0–36.0)
MCV: 86.4 fL (ref 80.0–100.0)
Platelets: 214 10*3/uL (ref 150–400)
RBC: 4.28 MIL/uL (ref 3.87–5.11)
RDW: 12.7 % (ref 11.5–15.5)
WBC: 6.4 10*3/uL (ref 4.0–10.5)
nRBC: 0 % (ref 0.0–0.2)

## 2023-04-12 LAB — I-STAT CHEM 8, ED
BUN: 27 mg/dL — ABNORMAL HIGH (ref 8–23)
Calcium, Ion: 1.26 mmol/L (ref 1.15–1.40)
Chloride: 103 mmol/L (ref 98–111)
Creatinine, Ser: 1.1 mg/dL — ABNORMAL HIGH (ref 0.44–1.00)
Glucose, Bld: 88 mg/dL (ref 70–99)
HCT: 37 % (ref 36.0–46.0)
Hemoglobin: 12.6 g/dL (ref 12.0–15.0)
Potassium: 3.4 mmol/L — ABNORMAL LOW (ref 3.5–5.1)
Sodium: 140 mmol/L (ref 135–145)
TCO2: 30 mmol/L (ref 22–32)

## 2023-04-12 LAB — COMPREHENSIVE METABOLIC PANEL
ALT: 13 U/L (ref 0–44)
AST: 16 U/L (ref 15–41)
Albumin: 3.7 g/dL (ref 3.5–5.0)
Alkaline Phosphatase: 73 U/L (ref 38–126)
Anion gap: 9 (ref 5–15)
BUN: 28 mg/dL — ABNORMAL HIGH (ref 8–23)
CO2: 27 mmol/L (ref 22–32)
Calcium: 9.4 mg/dL (ref 8.9–10.3)
Chloride: 102 mmol/L (ref 98–111)
Creatinine, Ser: 0.99 mg/dL (ref 0.44–1.00)
GFR, Estimated: >60 mL/min (ref >60)
Glucose, Bld: 89 mg/dL (ref 70–99)
Potassium: 3.3 mmol/L — ABNORMAL LOW (ref 3.5–5.1)
Sodium: 138 mmol/L (ref 135–145)
Total Bilirubin: 0.7 mg/dL (ref 0.3–1.2)
Total Protein: 6.6 g/dL (ref 6.5–8.1)

## 2023-04-12 LAB — CBG MONITORING, ED: Glucose-Capillary: 99 mg/dL (ref 70–99)

## 2023-04-12 LAB — APTT: aPTT: 28 seconds (ref 24–36)

## 2023-04-12 LAB — PROTIME-INR
INR: 1 (ref 0.8–1.2)
Prothrombin Time: 13 seconds (ref 11.4–15.2)

## 2023-04-12 LAB — ETHANOL: Alcohol, Ethyl (B): <10 mg/dL (ref <10)

## 2023-04-12 NOTE — ED Notes (Signed)
Dr. Elpidio Anis made aware of pt's neuro symptoms.

## 2023-04-12 NOTE — ED Triage Notes (Signed)
Pt presents with numbness and tingling, left hand, left-side of face, and left-foot since last Thursday, PMH of strokes.

## 2023-04-12 NOTE — ED Notes (Signed)
Pt given paperwork (discharge, [facility] packet and facesheet). Instructed to go to [facility] ED for MRI where they are expecting her. Verbalized understanding. IV removed. Pt seen leaving the department, ambulatory with steady gait and stable.

## 2023-04-12 NOTE — ED Triage Notes (Signed)
Pt arrives from [facility] for MRI. Continues to c/o numbness and tinglings. Denies any additional s/s. Is a&ox4, accompanied by spouse.

## 2023-04-12 NOTE — ED Notes (Signed)
Pt back from CT at this time- pt on cardiac monitor

## 2023-04-12 NOTE — ED Provider Notes (Signed)
[place]ERGENCY DEPARTMENT AT [facility] Provider Note   CSN: [id number] Arrival date & time: 04/12/23  1730     History  Chief Complaint  Yvonne [name] presents with   Numbness    Yvonne [name] is a 72 y.o. female with past medical history significant for hyperlipidemia, chronic CHF, poorly controlled diabetes, previous strokes, previous TIA who is currently on aspirin, Plavix who presents with concern for numbness, tingling, weakness on left side of body since last Thursday.  Yvonne [name] reports that Yvonne [name] previous baseline deficits are all right-sided and so Yvonne [name] knew that this was new for Yvonne [name].  Yvonne [name] was hoping the symptoms would go away which is why Yvonne [name] did not come in sooner.  Yvonne [name] reports that the symptoms are very persistent, with no significant change.  HPI     Home Medications Prior to Admission medications   Medication Sig Start Date End Date Taking? Authorizing Provider  ACCU-CHEK GUIDE test strip TEST BLOOD SUGAR 4 TIMES DAILY DX E11.69 03/05/23   Mechele Claude, MD  Accu-Chek Softclix Lancets lancets Test BS up to 4 times daily Dx E11.69 04/04/20   Mechele Claude, MD  albuterol (VENTOLIN HFA) 108 (90 Base) MCG/ACT inhaler TAKE 2 PUFFS BY MOUTH EVERY 6 HOURS AS NEEDED FOR WHEEZE OR SHORTNESS OF BREATH 08/28/22   Mechele Claude, MD  alendronate (FOSAMAX) 70 MG tablet TAKE 1 TABLET EVERY 7 DAYS WITH A FULL GLASS OF WATER ON AN EMPTY STOMACH 01/18/23   Mechele Claude, MD  ALPRAZolam Prudy Feeler) 1 MG tablet ! Each morning, one each evening, and 1/2 each afternoon 11/07/22   Mechele Claude, MD  amLODipine (NORVASC) 10 MG tablet TAKE 1 TABLET BY MOUTH EVERY DAY 02/06/23   Mechele Claude, MD  atorvastatin (LIPITOR) 80 MG tablet Take 1 tablet (80 mg total) by mouth daily. 02/12/23   Mechele Claude, MD  Bempedoic Acid-Ezetimibe (NEXLIZET) 180-10 MG TABS Take 1 tablet by mouth daily. Take 180 mg by mouth daily. 02/12/23   Mechele Claude, MD  Blood Glucose Monitoring Suppl (ACCU-CHEK GUIDE)  w/Device KIT Test Bs QID Dx E11.69 02/28/21   Mechele Claude, MD  clopidogrel (PLAVIX) 75 MG tablet TAKE 1 TABLET BY MOUTH EVERY DAY WITH BREAKFAST 02/06/23   Mechele Claude, MD  conjugated estrogens (PREMARIN) vaginal cream Use 0.5 gm in vagina nightly for 1 week then 2 x weekly 11/23/22   Adline Potter, NP  dapagliflozin propanediol (FARXIGA) 10 MG TABS tablet Take 1 tablet (10 mg total) by mouth daily. 10/12/22   Mechele Claude, MD  diclofenac Sodium (VOLTAREN) 1 % GEL Apply 4 g topically 4 (four) times daily. 08/15/22   Sonny Masters, FNP  doxycycline (VIBRA-TABS) 100 MG tablet Take 1 tablet (100 mg total) by mouth 2 (two) times daily. 1 po bid 03/07/23   Daphine Deutscher, Mary-Margaret, FNP  famotidine (PEPCID) 20 MG tablet Take 1 tablet (20 mg total) by mouth at bedtime. 08/07/22   Mechele Claude, MD  furosemide (LASIX) 20 MG tablet TAKE 1 TABLET BY MOUTH EVERY DAY 11/20/22   Mechele Claude, MD  glipiZIDE (GLUCOTROL) 5 MG tablet Take 1 tablet (5 mg total) by mouth daily before breakfast. 02/06/23   Mechele Claude, MD  naproxen (NAPROSYN) 500 MG tablet Take 1 tablet (500 mg total) by mouth 2 (two) times daily with a meal. 11/16/22   Daphine Deutscher, Mary-Margaret, FNP  nitroGLYCERIN (NITROSTAT) 0.4 MG SL tablet Place 1 tablet (0.4 mg total) under the tongue every 5 (five) minutes as needed for chest  pain. 02/15/21   Chandrasekhar, [place] Dragon A, MD  oxybutynin (DITROPAN-XL) 5 MG 24 hr tablet Take 1 tablet (5 mg total) by mouth at bedtime. 08/07/22   Mechele Claude, MD  pantoprazole (PROTONIX) 40 MG tablet Take 1 tablet (40 mg total) by mouth daily. For stomach 11/07/22   Mechele Claude, MD  promethazine-dextromethorphan (PROMETHAZINE-DM) 6.25-15 MG/5ML syrup Take 2.5 mLs by mouth at bedtime as needed for cough. 01/21/23   Raliegh Ip, DO  tirzepatide [facility]) 7.5 MG/0.5ML Pen Inject 7.5 mg into the skin once a week. 04/03/23   Mechele Claude, MD  triamcinolone ointment (KENALOG) 0.5 % Apply 1 application. topically  2 (two) times daily. 04/02/22   Gabriel Earing, FNP  vitamin B-12 (CYANOCOBALAMIN) 500 MCG tablet Take 1 tablet (500 mcg total) by mouth daily. 01/19/22   Catarina Hartshorn, MD      Allergies    Penicillins, Metformin and related, Gabapentin, Lisinopril, and Soap    Review of Systems   Review of Systems  All other systems reviewed and are negative.   Physical Exam Updated Vital Signs BP (!) 189/83   Pulse 78   Temp 98.4 F (36.9 C) (Oral)   Resp 15   Ht 5\' 2"  (1.575 m)   Wt 71.7 kg   SpO2 96%   BMI 28.90 kg/m  Physical Exam Vitals and nursing note reviewed.  Constitutional:      General: Yvonne [name] is not in acute distress.    Appearance: Normal appearance.  HENT:     Head: Normocephalic and atraumatic.  Eyes:     General:        Right eye: No discharge.        Left eye: No discharge.  Cardiovascular:     Rate and Rhythm: Normal rate and regular rhythm.     Heart sounds: No murmur heard.    No friction rub. No gallop.  Pulmonary:     Effort: Pulmonary effort is normal.     Breath sounds: Normal breath sounds.  Abdominal:     General: Bowel sounds are normal.     Palpations: Abdomen is soft.  Skin:    General: Skin is warm and dry.     Capillary Refill: Capillary refill takes less than 2 seconds.  Neurological:     Mental Status: Yvonne [name] is alert and oriented to person, place, and time.     Comments: Yvonne [name] with some left-sided weakness, 4/5 to flexion, extension of the left hip.  Yvonne [name] does have some left-sided pronator drift . Yvonne [name] has sensory discrepancy of left face, arm, and leg. Mild facial droop on right, Yvonne [name] reports Yvonne [name] is approximately at baseline as far as Yvonne [name] can tell. CN2-12 otherwise grossly intact, PERRLA, normal finger-nose-finger, some mild ataxia to heel shin on left, questionably 2/2 weakness  Psychiatric:        Mood and Affect: Mood normal.        Behavior: Behavior normal.     ED Results / Procedures / Treatments   Labs (all labs ordered are listed, but  only abnormal results are displayed) Labs Reviewed  COMPREHENSIVE METABOLIC PANEL - Abnormal; Notable for the following components:      Result Value   Potassium 3.3 (*)    BUN 28 (*)    All other components within normal limits  I-STAT CHEM 8, ED - Abnormal; Notable for the following components:   Potassium 3.4 (*)    BUN 27 (*)    Creatinine, Ser 1.10 (*)  All other components within normal limits  PROTIME-INR  APTT  CBC  DIFFERENTIAL  ETHANOL  CBG MONITORING, ED    EKG None  Radiology CT HEAD WO CONTRAST  Result Date: 04/12/2023 CLINICAL DATA:  Numbness and tingling of left hand and left side of face. History of strokes EXAM: CT HEAD WITHOUT CONTRAST TECHNIQUE: Contiguous axial images were obtained from the base of the skull through the vertex without intravenous contrast. RADIATION DOSE REDUCTION: This exam was performed according to the departmental dose-optimization program which includes automated exposure control, adjustment of the mA and/or kV according to Yvonne [name] size and/or use of iterative reconstruction technique. COMPARISON:  01/23/2022 FINDINGS: Brain: No mass lesion, hemorrhage, hydrocephalus, acute infarct, intra-axial, or extra-axial fluid collection. Vascular: No hyperdense vessel or unexpected calcification. Skull: Normal Sinuses/Orbits: Normal imaged portions of the orbits and globes. Clear paranasal sinuses and mastoid air cells. Other: None. IMPRESSION: No acute intracranial abnormality. Electronically Signed   By: Jeronimo Greaves M.D.   On: 04/12/2023 17:56    Procedures Procedures    Medications Ordered in ED Medications - No data to display  ED Course/ Medical Decision Making/ A&P                             Medical Decision Making Amount and/or Complexity of Data Reviewed Labs: ordered. Radiology: ordered.   This Yvonne [name] is a 72 y.o. female who presents to the ED for concern of weakness, numbness of left side, this involves an extensive number of  treatment options, and is a complaint that carries with it a high risk of complications and morbidity. The emergent differential diagnosis prior to evaluation includes, but is not limited to, acute stroke, TIA, transverse myelitis, intracranial hemorrhage, subdural or epidural hematoma, neuropathy, atypical MS presentation, atypical migraine presentation versus other. This is not an exhaustive differential.   Past Medical History / Co-morbidities / Social History: Previous history of stroke, Yvonne [name] is currently taking aspirin, Plavix, CAD, heart failure, hypertension, diabetes  Additional history: Chart reviewed. Pertinent results include: Reviewed previous lab work, imaging from emergency department, outpatient neurology notes with most recent CT of the head for stroke in February of last year  Physical Exam: Physical exam performed. The pertinent findings include: Yvonne [name] with some left-sided weakness, 4/5 to flexion, extension of the left hip.  Yvonne [name] does have some left-sided pronator drift . Yvonne [name] has sensory discrepancy of left face, arm, and leg. Mild facial droop on right, Yvonne [name] reports Yvonne [name] is approximately at baseline as far as Yvonne [name] can tell. CN2-12 otherwise grossly intact, PERRLA, normal finger-nose-finger, some mild ataxia to heel shin on left, questionably 2/2 weakness  Lab Tests: I ordered, and personally interpreted labs.  The pertinent results include: CMP notable for very mild hypokalemia, testing 3.3, Yvonne [name] BUN is mildly elevated 28, otherwise unremarkable.  CBC is unremarkable.  Normal ethanol level, normal PT/INR.  CBG unremarkable.   Imaging Studies: I ordered imaging studies including CT head without contrast. I independently visualized and interpreted imaging which showed no evidence of acute intracranial abnormality, after discussion with the neurologist we will obtain an MR brain, as we do not have MRI at this facility at this time we will arrange an ED to ED transfer for MR.. I agree  with the radiologist interpretation.  Consultations Obtained: I requested consultation with the neurologist, spoke with Dr. Wilford Corner who given Yvonne [name] risk for additional stroke does recommend ED to ED transfer for MRI, and admission if  any new stroke findings present.  I spoke with Dr. Hyacinth Meeker at [facility] who accepts the Yvonne [name] in transfer.  As Yvonne [name] is stable, is able to ambulate without significant difficulty and Yvonne [name] husband is here to accompany Yvonne [name] I think it is reasonable to send Yvonne [name] POV to [facility] at this time.   Disposition: After consideration of the diagnostic results and the patients response to treatment, I feel that Yvonne [name] is stable for POV transfer for MRI brain.   I discussed this case with my attending physician Dr. Elpidio Anis who cosigned this note including Yvonne [name]'s presenting symptoms, physical exam, and planned diagnostics and interventions. Attending physician stated agreement with plan or made changes to plan which were implemented.    Final Clinical Impression(s) / ED Diagnoses Final diagnoses:  Numbness  Weakness    Rx / DC Orders ED Discharge Orders     None         [place] 04/12/23 2016    Mardene Sayer, MD 04/13/23 [phone]

## 2023-04-30 ENCOUNTER — Other Ambulatory Visit: Payer: Self-pay | Admitting: Family Medicine

## 2023-04-30 DIAGNOSIS — F411 Generalized anxiety disorder: Secondary | ICD-10-CM

## 2023-04-30 DIAGNOSIS — F1320 Sedative, hypnotic or anxiolytic dependence, uncomplicated: Secondary | ICD-10-CM

## 2023-05-09 ENCOUNTER — Other Ambulatory Visit: Payer: Self-pay | Admitting: Family Medicine

## 2023-05-09 ENCOUNTER — Other Ambulatory Visit: Payer: Medicare PPO

## 2023-05-09 ENCOUNTER — Encounter: Payer: Self-pay | Admitting: Family Medicine

## 2023-05-09 ENCOUNTER — Ambulatory Visit (INDEPENDENT_AMBULATORY_CARE_PROVIDER_SITE_OTHER): Payer: Medicare PPO

## 2023-05-09 ENCOUNTER — Ambulatory Visit (INDEPENDENT_AMBULATORY_CARE_PROVIDER_SITE_OTHER): Payer: Medicare PPO | Admitting: Family Medicine

## 2023-05-09 VITALS — BP 145/80 | HR 69 | Temp 97.6°F | Ht 62.0 in | Wt 156.4 lb

## 2023-05-09 DIAGNOSIS — M7989 Other specified soft tissue disorders: Secondary | ICD-10-CM | POA: Diagnosis not present

## 2023-05-09 DIAGNOSIS — N1831 Chronic kidney disease, stage 3a: Secondary | ICD-10-CM | POA: Diagnosis not present

## 2023-05-09 DIAGNOSIS — E1122 Type 2 diabetes mellitus with diabetic chronic kidney disease: Secondary | ICD-10-CM | POA: Diagnosis not present

## 2023-05-09 DIAGNOSIS — Z7984 Long term (current) use of oral hypoglycemic drugs: Secondary | ICD-10-CM

## 2023-05-09 DIAGNOSIS — F411 Generalized anxiety disorder: Secondary | ICD-10-CM

## 2023-05-09 DIAGNOSIS — Z780 Asymptomatic menopausal state: Secondary | ICD-10-CM

## 2023-05-09 DIAGNOSIS — I10 Essential (primary) hypertension: Secondary | ICD-10-CM

## 2023-05-09 DIAGNOSIS — F1320 Sedative, hypnotic or anxiolytic dependence, uncomplicated: Secondary | ICD-10-CM | POA: Diagnosis not present

## 2023-05-09 DIAGNOSIS — E782 Mixed hyperlipidemia: Secondary | ICD-10-CM

## 2023-05-09 LAB — CBC WITH DIFFERENTIAL/PLATELET
Basophils Absolute: 0.1 10*3/uL (ref 0.0–0.2)
Basos: 1 %
EOS (ABSOLUTE): 0.1 10*3/uL (ref 0.0–0.4)
Eos: 1 %
Hematocrit: 39.9 % (ref 34.0–46.6)
Hemoglobin: 13.5 g/dL (ref 11.1–15.9)
Immature Grans (Abs): 0 10*3/uL (ref 0.0–0.1)
Immature Granulocytes: 0 %
Lymphocytes Absolute: 2.4 10*3/uL (ref 0.7–3.1)
Lymphs: 41 %
MCH: 28.8 pg (ref 26.6–33.0)
MCHC: 33.8 g/dL (ref 31.5–35.7)
MCV: 85 fL (ref 79–97)
Monocytes Absolute: 0.4 10*3/uL (ref 0.1–0.9)
Monocytes: 7 %
Neutrophils Absolute: 2.9 10*3/uL (ref 1.4–7.0)
Neutrophils: 50 %
Platelets: 247 10*3/uL (ref 150–450)
RBC: 4.69 x10E6/uL (ref 3.77–5.28)
RDW: 12.4 % (ref 11.7–15.4)
WBC: 5.9 10*3/uL (ref 3.4–10.8)

## 2023-05-09 LAB — LIPID PANEL
Chol/HDL Ratio: 3.4 ratio (ref 0.0–4.4)
Cholesterol, Total: 189 mg/dL (ref 100–199)
HDL: 56 mg/dL (ref >39)
LDL Chol Calc (NIH): 115 mg/dL — ABNORMAL HIGH (ref 0–99)
Triglycerides: 97 mg/dL (ref 0–149)
VLDL Cholesterol Cal: 18 mg/dL (ref 5–40)

## 2023-05-09 LAB — CMP14+EGFR
ALT: 11 IU/L (ref 0–32)
AST: 15 IU/L (ref 0–40)
Albumin/Globulin Ratio: 1.8
Albumin: 4.1 g/dL (ref 3.8–4.8)
Alkaline Phosphatase: 104 IU/L (ref 44–121)
BUN/Creatinine Ratio: 22 (ref 12–28)
BUN: 20 mg/dL (ref 8–27)
Bilirubin Total: 0.5 mg/dL (ref 0.0–1.2)
CO2: 24 mmol/L (ref 20–29)
Calcium: 9.8 mg/dL (ref 8.7–10.3)
Chloride: 105 mmol/L (ref 96–106)
Creatinine, Ser: 0.9 mg/dL (ref 0.57–1.00)
Globulin, Total: 2.3 g/dL (ref 1.5–4.5)
Glucose: 92 mg/dL (ref 70–99)
Potassium: 4.5 mmol/L (ref 3.5–5.2)
Sodium: 143 mmol/L (ref 134–144)
Total Protein: 6.4 g/dL (ref 6.0–8.5)
eGFR: 68 mL/min/{1.73_m2} (ref >59)

## 2023-05-09 LAB — BAYER DCA HB A1C WAIVED: HB A1C (BAYER DCA - WAIVED): 5.8 % — ABNORMAL HIGH (ref 4.8–5.6)

## 2023-05-09 NOTE — Progress Notes (Signed)
Subjective:  Patient ID: Yvonne [name],  female    DOB: 05/14/1951  Age: 72 y.o.    CC: Medical Management of Chronic Issues   HPI Yvonne [name] presents for  follow-up of hypertension. Patient has no history of headache chest pain or shortness of breath or recent cough. Patient also denies symptoms of TIA such as numbness weakness lateralizing. Patient denies side effects from medication. States taking it regularly.  Patient also  in for follow-up of elevated cholesterol. Doing well without complaints on current medication. Denies side effects  including myalgia and arthralgia and nausea. Also in today for liver function testing. Currently no chest pain, shortness of breath or other cardiovascular related symptoms noted.  Follow-up of diabetes. Patient does check blood sugar at home. Readings run between 90's and 123 Patient denies symptoms such as excessive hunger or urinary frequency, excessive hunger, nausea No significant hypoglycemic spells noted. Medications reviewed. Pt reports taking them regularly. Pt. denies complication/adverse reaction today.   Anxious about daughter in [place] care of grand kids. Feels she is selfish and neglectful, so she makes sure they get what they need.     05/09/2023   10:32 AM 04/03/2023   10:32 AM 03/07/2023    8:49 AM 02/06/2023    9:24 AM  GAD 7 : Generalized Anxiety Score  Nervous, Anxious, on Edge 2 3 3 3   Control/stop worrying 3 3 3 3   Worry too much - different things 3 3 3 3   Trouble relaxing 3 2 3 3   Restless 2 2 3 2   Easily annoyed or irritable 3 1 2 3   Afraid - awful might happen 3 3 3 2   Total GAD 7 Score 19 17 20 19   Anxiety Difficulty Somewhat difficult Somewhat difficult Not difficult at all Somewhat difficult       05/09/2023   10:31 AM 05/09/2023   10:22 AM 04/03/2023   10:28 AM 03/07/2023    8:49 AM 03/05/2023   11:17 AM  Depression screen PHQ 2/9  Decreased Interest 3 0 2 3 0  Down, Depressed, Hopeless 2 0 3 2 0  PHQ - 2  Score 5 0 5 5 0  Altered sleeping 3  3 3  0  Tired, decreased energy 2  1 2  0  Change in appetite 3  3 2  0  Feeling bad or failure about yourself  3  3 3  0  Trouble concentrating 2  1 1  0  Moving slowly or fidgety/restless 3  2 3  0  Suicidal thoughts 0  0 0 0  PHQ-9 Score 21  18 19  0  Difficult doing work/chores Somewhat difficult  Somewhat difficult Somewhat difficult Not difficult at all     History Yvonne [name] has a past medical history of (HFpEF) heart failure with preserved ejection fraction (HCC), Anxiety, Aortic atherosclerosis (HCC), Arthritis, Asthma, CAD (coronary artery disease), Carotid artery disease (HCC), Chronic cough, Chronic otitis media (12/2017), Full dentures, GERD (gastroesophageal reflux disease), History of stroke (09/2017), Hyperlipidemia, Hypertension, Non-insulin dependent type 2 diabetes mellitus (HCC), Orthostatic hypotension, Overactive bladder, PSVT (paroxysmal supraventricular tachycardia), Recurrent acute suppurative otitis media without spontaneous rupture of left tympanic membrane (02/19/2018), Stroke (HCC), and Transient cerebral ischemia.   She has a past surgical history that includes Cholecystectomy; Colonoscopy (N/A, 08/17/2015); Abdominal hysterectomy; Lacrimal duct exploration (Bilateral); Cataract extraction w/ intraocular lens implant (Right); Vitrectomy (Left, 09/14/2015); Gas insertion (Right); and Myringotomy with tube placement (Bilateral, 01/28/2018).   Her family history includes Arthritis in her brother and  mother; Arthritis-Osteo in her son; Asthma in her father; Breast cancer in her mother; CVA in her mother; Cancer in her brother; Diabetes in her father and mother; Heart disease in her father; Hepatitis C in her sister; Peripheral Artery Disease in her daughter; Stroke in her mother.She reports that she has never smoked. She has never used smokeless tobacco. She reports that she does not currently use alcohol. She reports that she does not use  drugs.  Current Outpatient Medications on File Prior to Visit  Medication Sig Dispense Refill   ACCU-CHEK GUIDE test strip TEST BLOOD SUGAR 4 TIMES DAILY DX E11.69 400 strip 3   Accu-Chek Softclix Lancets lancets Test BS up to 4 times daily Dx E11.69 400 each 3   albuterol (VENTOLIN HFA) 108 (90 Base) MCG/ACT inhaler TAKE 2 PUFFS BY MOUTH EVERY 6 HOURS AS NEEDED FOR WHEEZE OR SHORTNESS OF BREATH 54 each 1   alendronate (FOSAMAX) 70 MG tablet TAKE 1 TABLET EVERY 7 DAYS WITH A FULL GLASS OF WATER ON AN EMPTY STOMACH 12 tablet 0   amLODipine (NORVASC) 10 MG tablet TAKE 1 TABLET BY MOUTH EVERY DAY 90 tablet 3   atorvastatin (LIPITOR) 80 MG tablet Take 1 tablet (80 mg total) by mouth daily. 90 tablet 2   Bempedoic Acid-Ezetimibe (NEXLIZET) 180-10 MG TABS Take 1 tablet by mouth daily. Take 180 mg by mouth daily. 90 tablet 3   Blood Glucose Monitoring Suppl (ACCU-CHEK GUIDE) w/Device KIT Test Bs QID Dx E11.69 1 kit 0   clopidogrel (PLAVIX) 75 MG tablet TAKE 1 TABLET BY MOUTH EVERY DAY WITH BREAKFAST 90 tablet 3   conjugated estrogens (PREMARIN) vaginal cream Use 0.5 gm in vagina nightly for 1 week then 2 x weekly 12 g 0   dapagliflozin propanediol (FARXIGA) 10 MG TABS tablet Take 1 tablet (10 mg total) by mouth daily. 90 tablet 5   diclofenac Sodium (VOLTAREN) 1 % GEL Apply 4 g topically 4 (four) times daily. 350 g 0   doxycycline (VIBRA-TABS) 100 MG tablet Take 1 tablet (100 mg total) by mouth 2 (two) times daily. 1 po bid 28 tablet 0   famotidine (PEPCID) 20 MG tablet Take 1 tablet (20 mg total) by mouth at bedtime. 90 tablet 3   glipiZIDE (GLUCOTROL) 5 MG tablet Take 1 tablet (5 mg total) by mouth daily before breakfast. 90 tablet 3   naproxen (NAPROSYN) 500 MG tablet Take 1 tablet (500 mg total) by mouth 2 (two) times daily with a meal. 60 tablet 1   nitroGLYCERIN (NITROSTAT) 0.4 MG SL tablet Place 1 tablet (0.4 mg total) under the tongue every 5 (five) minutes as needed for chest pain. 25 tablet 10    oxybutynin (DITROPAN-XL) 5 MG 24 hr tablet Take 1 tablet (5 mg total) by mouth at bedtime. 90 tablet 3   pantoprazole (PROTONIX) 40 MG tablet Take 1 tablet (40 mg total) by mouth daily. For stomach 30 tablet 11   promethazine-dextromethorphan (PROMETHAZINE-DM) 6.25-15 MG/5ML syrup Take 2.5 mLs by mouth at bedtime as needed for cough. 118 mL 0   tirzepatide (MOUNJARO) 7.5 MG/0.5ML Pen Inject 7.5 mg into the skin once a week. 6 mL 3   triamcinolone ointment (KENALOG) 0.5 % Apply 1 application. topically 2 (two) times daily. 30 g 0   vitamin B-12 (CYANOCOBALAMIN) 500 MCG tablet Take 1 tablet (500 mcg total) by mouth daily.     Current Facility-Administered Medications on File Prior to Visit  Medication Dose Route Frequency Provider Last Rate  Last Admin   promethazine (PHENERGAN) injection 25 mg  25 mg Intramuscular Q6H PRN Daphine Deutscher, Mary-Margaret, FNP   25 mg at 12/28/22 1316    ROS Review of Systems  Constitutional: Negative.   HENT: Negative.    Eyes:  Negative for visual disturbance.  Respiratory:  Negative for shortness of breath.   Cardiovascular:  Negative for chest pain.  Gastrointestinal:  Negative for abdominal pain.  Musculoskeletal:  Negative for arthralgias.    Objective:  BP (!) 145/80   Pulse 69   Temp 97.6 F (36.4 C)   Ht 5\' 2"  (1.575 m)   Wt 156 lb 6.4 oz (70.9 kg)   SpO2 99%   BMI 28.61 kg/m   BP Readings from Last 3 Encounters:  05/09/23 (!) 145/80  04/12/23 (!) 180/101  04/03/23 136/78    Wt Readings from Last 3 Encounters:  05/09/23 156 lb 6.4 oz (70.9 kg)  04/12/23 158 lb (71.7 kg)  04/03/23 156 lb 3.2 oz (70.9 kg)     Physical Exam Constitutional:      General: She is not in acute distress.    Appearance: She is well-developed.  Cardiovascular:     Rate and Rhythm: Normal rate and regular rhythm.  Pulmonary:     Breath sounds: Normal breath sounds.  Musculoskeletal:        General: Normal range of motion.  Skin:    General: Skin is warm  and dry.  Neurological:     Mental Status: She is alert and oriented to person, place, and time.     Diabetic Foot Exam - Simple   No data filed     Lab Results  Component Value Date   HGBA1C 5.8 (H) 05/09/2023   HGBA1C 5.9 (H) 02/06/2023   HGBA1C 7.5 (H) 11/07/2022    Assessment & Plan:   Yvonne [name] was seen today for medical management of chronic issues.  Diagnoses and all orders for this visit:  Mixed hyperlipidemia -     Lipid panel  Type 2 diabetes mellitus with stage 3a chronic kidney disease, without long-term current use of insulin (HCC) -     Bayer DCA Hb A1c Waived  Primary hypertension -     CBC with Differential/Platelet -     CMP14+EGFR  GAD (generalized anxiety disorder) -     ALPRAZolam (XANAX) 1 MG tablet; ! Each morning, one each evening, and 1/2 each afternoon  Benzodiazepine dependence, continuous (HCC) -     ALPRAZolam (XANAX) 1 MG tablet; ! Each morning, one each evening, and 1/2 each afternoon  Swelling of lower extremity -     furosemide (LASIX) 20 MG tablet; Take 1 tablet (20 mg total) by mouth daily.   I have changed Yvonne [name]'s furosemide. I am also having her maintain her Accu-Chek Softclix Lancets, nitroGLYCERIN, Accu-Chek Guide, cyanocobalamin, triamcinolone ointment, oxybutynin, famotidine, diclofenac Sodium, albuterol, dapagliflozin propanediol, pantoprazole, naproxen, Premarin, alendronate, promethazine-dextromethorphan, amLODipine, clopidogrel, glipiZIDE, atorvastatin, Nexlizet, Accu-Chek Guide, doxycycline, tirzepatide, and ALPRAZolam. We will continue to administer promethazine.  Meds ordered this encounter  Medications   ALPRAZolam (XANAX) 1 MG tablet    Sig: ! Each morning, one each evening, and 1/2 each afternoon    Dispense:  75 tablet    Refill:  5   furosemide (LASIX) 20 MG tablet    Sig: Take 1 tablet (20 mg total) by mouth daily.    Dispense:  90 tablet    Refill:  1     Follow-up: Return in about 3  months (around  08/09/2023).  Mechele Claude, M.D.

## 2023-05-10 ENCOUNTER — Telehealth: Payer: Self-pay | Admitting: Family Medicine

## 2023-05-10 DIAGNOSIS — M8589 Other specified disorders of bone density and structure, multiple sites: Secondary | ICD-10-CM | POA: Diagnosis not present

## 2023-05-10 DIAGNOSIS — Z780 Asymptomatic menopausal state: Secondary | ICD-10-CM | POA: Diagnosis not present

## 2023-05-10 NOTE — Telephone Encounter (Signed)
Returned call and states that she seen Dr. Darlyn Read yestrday and he was supposed to send in her xanax and 2 other medications.  States she did not know which medications he was supposed to send in since he did not tell her.  Aware you are out of the office and since this is a controlled substance it will have to wait on PCP.  Patient voiced understanding but states she has been without medication for 10 days.

## 2023-05-13 ENCOUNTER — Other Ambulatory Visit: Payer: Self-pay | Admitting: Family Medicine

## 2023-05-13 ENCOUNTER — Encounter: Payer: Self-pay | Admitting: Family Medicine

## 2023-05-13 ENCOUNTER — Other Ambulatory Visit: Payer: Self-pay | Admitting: Nurse Practitioner

## 2023-05-13 DIAGNOSIS — F322 Major depressive disorder, single episode, severe without psychotic features: Secondary | ICD-10-CM

## 2023-05-13 DIAGNOSIS — J309 Allergic rhinitis, unspecified: Secondary | ICD-10-CM

## 2023-05-13 DIAGNOSIS — F411 Generalized anxiety disorder: Secondary | ICD-10-CM

## 2023-05-13 MED ORDER — FUROSEMIDE 20 MG PO TABS: 20.0000 mg | ORAL_TABLET | Freq: Every day | ORAL | 1 refills | Status: DC

## 2023-05-13 MED ORDER — ALPRAZOLAM 1 MG PO TABS
ORAL_TABLET | ORAL | 5 refills | Status: DC
Start: 2023-05-13 — End: 2023-11-11

## 2023-05-13 NOTE — Telephone Encounter (Signed)
Please let the patient know that I sent their prescription to their pharmacy. Thanks, WS 

## 2023-05-14 NOTE — Telephone Encounter (Signed)
PATIENT AWARE

## 2023-05-16 ENCOUNTER — Other Ambulatory Visit: Payer: Self-pay | Admitting: Family Medicine

## 2023-05-16 DIAGNOSIS — Z1231 Encounter for screening mammogram for malignant neoplasm of breast: Secondary | ICD-10-CM

## 2023-05-26 DIAGNOSIS — M7732 Calcaneal spur, left foot: Secondary | ICD-10-CM | POA: Diagnosis not present

## 2023-05-26 DIAGNOSIS — W231XXA Caught, crushed, jammed, or pinched between stationary objects, initial encounter: Secondary | ICD-10-CM | POA: Diagnosis not present

## 2023-05-26 DIAGNOSIS — M79644 Pain in right finger(s): Secondary | ICD-10-CM | POA: Diagnosis not present

## 2023-05-26 DIAGNOSIS — Z8673 Personal history of transient ischemic attack (TIA), and cerebral infarction without residual deficits: Secondary | ICD-10-CM | POA: Diagnosis not present

## 2023-05-26 DIAGNOSIS — S6991XA Unspecified injury of right wrist, hand and finger(s), initial encounter: Secondary | ICD-10-CM | POA: Diagnosis not present

## 2023-05-26 DIAGNOSIS — S96912A Strain of unspecified muscle and tendon at ankle and foot level, left foot, initial encounter: Secondary | ICD-10-CM | POA: Diagnosis not present

## 2023-05-26 DIAGNOSIS — J42 Unspecified chronic bronchitis: Secondary | ICD-10-CM | POA: Diagnosis not present

## 2023-05-26 DIAGNOSIS — E119 Type 2 diabetes mellitus without complications: Secondary | ICD-10-CM | POA: Diagnosis not present

## 2023-05-26 DIAGNOSIS — M25572 Pain in left ankle and joints of left foot: Secondary | ICD-10-CM | POA: Diagnosis not present

## 2023-05-26 DIAGNOSIS — S96919A Strain of unspecified muscle and tendon at ankle and foot level, unspecified foot, initial encounter: Secondary | ICD-10-CM | POA: Diagnosis not present

## 2023-05-26 DIAGNOSIS — Z7984 Long term (current) use of oral hypoglycemic drugs: Secondary | ICD-10-CM | POA: Diagnosis not present

## 2023-05-26 DIAGNOSIS — S93402A Sprain of unspecified ligament of left ankle, initial encounter: Secondary | ICD-10-CM | POA: Diagnosis not present

## 2023-05-26 DIAGNOSIS — Z79899 Other long term (current) drug therapy: Secondary | ICD-10-CM | POA: Diagnosis not present

## 2023-05-26 DIAGNOSIS — M7989 Other specified soft tissue disorders: Secondary | ICD-10-CM | POA: Diagnosis not present

## 2023-05-26 DIAGNOSIS — I10 Essential (primary) hypertension: Secondary | ICD-10-CM | POA: Diagnosis not present

## 2023-06-03 DIAGNOSIS — S6991XS Unspecified injury of right wrist, hand and finger(s), sequela: Secondary | ICD-10-CM | POA: Diagnosis not present

## 2023-06-03 DIAGNOSIS — M25572 Pain in left ankle and joints of left foot: Secondary | ICD-10-CM | POA: Diagnosis not present

## 2023-06-03 DIAGNOSIS — M7662 Achilles tendinitis, left leg: Secondary | ICD-10-CM | POA: Diagnosis not present

## 2023-06-03 DIAGNOSIS — M7752 Other enthesopathy of left foot: Secondary | ICD-10-CM | POA: Diagnosis not present

## 2023-06-06 NOTE — Progress Notes (Deleted)
  Cardiology Office Note:    Date:  06/06/2023  ID:  Yvonne [name], DOB 1951-10-17, MRN [id number] PCP: Mechele Claude, MD  [facility] Providers Cardiologist:  None { Click to update primary MD,subspecialty MD or APP then REFRESH:1}    {Click to Open Review  :1}   Patient Profile:      (HFpEF) heart failure with preserved ejection fraction  TTE 01/17/2022: EF 65-70, no RWMA, moderate concentric LVH, GR 1 DD, normal RVSF, normal PASP, RVSP 26.4, RAP 3 Coronary artery disease (nonobstructive) CCTA 02/09/2021: CAC score 736 (94th percentile), RCA ostial 25-49, mid 25-49; LM ostial <10; LAD proximal 25-49, mid 50-70, 25-49, D2 1-24>> FFR distal tip of LAD 0.79, otherwise no obstructive disease based upon FFR Supraventricular Tachycardia  Hypertension  Diabetes mellitus  Hyperlipidemia  Chronic kidney disease  Hx of multiple CVAs  Prior lacunar infarcts 01/03/21 Monitor 01/2022: NSR, 5 runs of SVT-asymptomatic (longest 10 seconds), <1% PACs, PVCs, no A-fib Carotid stenosis Carotid US 06/11/2019: Bilateral ICA <50 Orthostatic hypotension Aortic atherosclerosis  Asthma      History of Present Illness:   Yvonne [name] is a 72 y.o. female who returns for follow-up of CAD, CHF.  She was last seen by Dr. Raynelle Jan in March 2023 after a couple of admissions for strokes.  Follow-up monitor demonstrated no atrial fibrillation.***  ROS ***    Studies Reviewed:        *** Risk Assessment/Calculations:   {Does this patient have ATRIAL FIBRILLATION?[phone]} No BP recorded.  {Refresh Note OR Click here to enter BP  :1}***       Physical Exam:   VS:  There were no vitals taken for this visit.   Wt Readings from Last 3 Encounters:  05/09/23 156 lb 6.4 oz (70.9 kg)  04/12/23 158 lb (71.7 kg)  04/03/23 156 lb 3.2 oz (70.9 kg)    Physical Exam***    ASSESSMENT AND PLAN:   No problem-specific Assessment & Plan notes found for this encounter. ***    {Are you ordering a CV  Procedure (e.g. stress test, cath, DCCV, TEE, etc)?   Press F2        :[id number]}  Dispo:  No follow-ups on file.  Signed, Tereso Newcomer, PA-C

## 2023-06-07 ENCOUNTER — Ambulatory Visit: Payer: Medicare PPO | Attending: Physician Assistant | Admitting: Physician Assistant

## 2023-06-07 DIAGNOSIS — I5032 Chronic diastolic (congestive) heart failure: Secondary | ICD-10-CM

## 2023-06-07 DIAGNOSIS — I2510 Atherosclerotic heart disease of native coronary artery without angina pectoris: Secondary | ICD-10-CM

## 2023-06-10 ENCOUNTER — Ambulatory Visit: Payer: Medicare PPO | Admitting: Physical Therapy

## 2023-06-19 ENCOUNTER — Encounter: Payer: Self-pay | Admitting: Family Medicine

## 2023-06-19 ENCOUNTER — Ambulatory Visit (INDEPENDENT_AMBULATORY_CARE_PROVIDER_SITE_OTHER): Payer: Medicare PPO | Admitting: Family Medicine

## 2023-06-19 VITALS — BP 149/81 | HR 74 | Temp 97.0°F | Ht 62.0 in | Wt 153.6 lb

## 2023-06-19 DIAGNOSIS — G459 Transient cerebral ischemic attack, unspecified: Secondary | ICD-10-CM

## 2023-06-19 MED ORDER — ASPIRIN 81 MG PO TBEC: 81.0000 mg | DELAYED_RELEASE_TABLET | Freq: Every day | ORAL | Status: DC

## 2023-06-19 NOTE — Progress Notes (Signed)
Subjective:  Patient ID: Yvonne [name], female    DOB: 12-Feb-1951  Age: 72 y.o. MRN: [id number]  CC: Referral (Requests neurology referral) and Numbness (Left sided face, arm, leg numbness)   HPI Yvonne [name] presents for awakening during the night when her left side started tingling, arm and leg, not face. It went numb and then weak. She couln'd move. Lasted about 5 min. Now back to normal. Similar sx 1-2 years ago. Seen by Dr Gerilyn Pilgrim of neurology who has since retired. Currently taking plavix daily since then.   Under a lot of stress due to family situation. (Mother in Law's ashes stored in her home until burial in 2 days. Stressed by all the arrangements she has had to make as well as family dynamics.      06/19/2023    9:30 AM 06/19/2023    9:21 AM 05/09/2023   10:31 AM  Depression screen PHQ 2/9  Decreased Interest 2 0 3  Down, Depressed, Hopeless 1 0 2  PHQ - 2 Score 3 0 5  Altered sleeping 3  3  Tired, decreased energy 0  2  Change in appetite 2  3  Feeling bad or failure about yourself  3  3  Trouble concentrating 0  2  Moving slowly or fidgety/restless 2  3  Suicidal thoughts 0  0  PHQ-9 Score 13  21  Difficult doing work/chores Somewhat difficult  Somewhat difficult    History Asherah has a past medical history of (HFpEF) heart failure with preserved ejection fraction (HCC), Anxiety, Aortic atherosclerosis (HCC), Arthritis, Asthma, CAD (coronary artery disease), Carotid artery disease (HCC), Chronic cough, Chronic otitis media (12/2017), Full dentures, GERD (gastroesophageal reflux disease), History of stroke (09/2017), Hyperlipidemia, Hypertension, Non-insulin dependent type 2 diabetes mellitus (HCC), Orthostatic hypotension, Overactive bladder, PSVT (paroxysmal supraventricular tachycardia), Recurrent acute suppurative otitis media without spontaneous rupture of left tympanic membrane (02/19/2018), Stroke (HCC), and Transient cerebral ischemia.   She has a past surgical  history that includes Cholecystectomy; Colonoscopy (N/A, 08/17/2015); Abdominal hysterectomy; Lacrimal duct exploration (Bilateral); Cataract extraction w/ intraocular lens implant (Right); Vitrectomy (Left, 09/14/2015); Gas insertion (Right); and Myringotomy with tube placement (Bilateral, 01/28/2018).   Her family history includes Arthritis in her brother and mother; Arthritis-Osteo in her son; Asthma in her father; Breast cancer in her mother; CVA in her mother; Cancer in her brother; Diabetes in her father and mother; Heart disease in her father; Hepatitis C in her sister; Peripheral Artery Disease in her daughter; Stroke in her mother.She reports that she has never smoked. She has never used smokeless tobacco. She reports that she does not currently use alcohol. She reports that she does not use drugs.    ROS Review of Systems  Constitutional: Negative.   HENT: Negative.    Eyes:  Negative for visual disturbance.  Respiratory:  Negative for shortness of breath.   Cardiovascular:  Negative for chest pain.  Gastrointestinal:  Negative for abdominal pain.  Musculoskeletal:  Negative for arthralgias.  Neurological:  Positive for weakness and numbness.    Objective:  BP (!) 149/81   Pulse 74   Temp (!) 97 F (36.1 C)   Ht 5\' 2"  (1.575 m)   Wt 153 lb 9.6 oz (69.7 kg)   SpO2 98%   BMI 28.09 kg/m   BP Readings from Last 3 Encounters:  06/19/23 (!) 149/81  05/09/23 (!) 145/80  04/12/23 (!) 180/101    Wt Readings from Last 3 Encounters:  06/19/23 153 lb  9.6 oz (69.7 kg)  05/09/23 156 lb 6.4 oz (70.9 kg)  04/12/23 158 lb (71.7 kg)     Physical Exam Constitutional:      General: She is not in acute distress.    Appearance: She is well-developed.  Cardiovascular:     Rate and Rhythm: Normal rate and regular rhythm.  Pulmonary:     Breath sounds: Normal breath sounds.  Musculoskeletal:        General: Normal range of motion.  Skin:    General: Skin is warm and dry.   Neurological:     General: No focal deficit present.     Mental Status: She is alert and oriented to person, place, and time.     Motor: No weakness.     Coordination: Coordination normal.       Assessment & Plan:   Shawnetta was seen today for referral and numbness.  Diagnoses and all orders for this visit:  TIA (transient ischemic attack) -     Ambulatory referral to Neurology  Other orders -     aspirin EC 81 MG tablet; Take 1 tablet (81 mg total) by mouth daily. Swallow whole.   TIA resolved for now. Add ASA.   I have discontinued Lawernce Keas. Rodelo's naproxen. I am also having her start on aspirin EC. Additionally, I am having her maintain her Accu-Chek Softclix Lancets, nitroGLYCERIN, Accu-Chek Guide, cyanocobalamin, triamcinolone ointment, oxybutynin, famotidine, diclofenac Sodium, albuterol, dapagliflozin propanediol, pantoprazole, Premarin, alendronate, promethazine-dextromethorphan, amLODipine, clopidogrel, glipiZIDE, atorvastatin, Nexlizet, Accu-Chek Guide, doxycycline, tirzepatide, ALPRAZolam, and furosemide. We will continue to administer promethazine.  Allergies as of 06/19/2023       Reactions   Penicillins Hives, Itching   Has patient had a PCN reaction causing immediate rash, facial/tongue/throat swelling, SOB or lightheadedness with hypotension: Yes Has patient had a PCN reaction causing severe rash involving mucus membranes or skin necrosis: Unk Has patient had a PCN reaction that required hospitalization: No Has patient had a PCN reaction occurring within the last 10 years: No If all of the above answers are "NO", then may proceed with Cephalosporin use. Has patient had a PCN reaction causing immediate rash, facial/tongue/throat swelling, SOB or lightheadedness with hypotension: Yes Has patient had a PCN reaction causing severe rash involving mucus membranes or skin necrosis: No Has patient had a PCN reaction that required hospitalization No Has patient had a PCN  reaction occurring within the last 10 years: No If all of the above answers are "NO", then may proceed with Cephalosporin use. Has patient had a PCN reaction causing immediate rash, facial/tongue/throat swelling, SOB or lightheadedness with hypotension: Yes Has patient had a PCN reaction causing severe rash involving mucus membranes or skin necrosis: Unk Has patient had a PCN reaction that required hospitalization: No Has patient had a PCN reaction occurring within the last 10 years: No If all of the above answers are "NO", then may proceed with Cephalosporin use. Has patient had a PCN reaction causing immediate rash, facial/tongue/throat swelling, SOB or lightheadedness with hypotension: Yes Has patient had a PCN reaction causing severe rash involving mucus membranes or skin necrosis: No Has patient had a PCN reaction that required hospitalization No Has patient had a PCN reaction occurring within the last 10 years: No If all of the above answers are "NO", then may proceed with Cephalosporin use. Has patient had a PCN reaction causing immediate rash, facial/tongue/throat swelling, SOB or lightheadedness with hypotension: Yes Has patient had a PCN reaction causing sev... (TRUNCATED)  Metformin And Related Diarrhea   dizziness   Gabapentin Other (See Comments)   Causes a lot of lethargy at a higher dose Causes a lot of lethargy at a higher dose Causes a lot of lethargy at a higher dose   Lisinopril Cough   HAS A CHRONIC COUGH AND DID NOT NOTICE IMPROVEMENT OF COUGH WHEN LISINOPRIL WAS STOPPED HAS A CHRONIC COUGH AND DID NOT NOTICE IMPROVEMENT OF COUGH WHEN LISINOPRIL WAS STOPPED HAS A CHRONIC COUGH AND DID NOT NOTICE IMPROVEMENT OF COUGH WHEN LISINOPRIL WAS STOPPED   Soap Rash   DIAL soap        Medication List        Accurate as of June 19, 2023 10:34 AM. If you have any questions, ask your nurse or doctor.          STOP taking these medications    naproxen 500 MG  tablet Commonly known as: Naprosyn Stopped by: Eder Macek       TAKE these medications    Accu-Chek Guide test strip Generic drug: glucose blood TEST BLOOD SUGAR 4 TIMES DAILY DX E11.69   Accu-Chek Guide w/Device Kit Test Bs QID Dx E11.69   Accu-Chek Softclix Lancets lancets Test BS up to 4 times daily Dx E11.69   albuterol 108 (90 Base) MCG/ACT inhaler Commonly known as: VENTOLIN HFA TAKE 2 PUFFS BY MOUTH EVERY 6 HOURS AS NEEDED FOR WHEEZE OR SHORTNESS OF BREATH   alendronate 70 MG tablet Commonly known as: FOSAMAX TAKE 1 TABLET EVERY 7 DAYS WITH A FULL GLASS OF WATER ON AN EMPTY STOMACH   ALPRAZolam 1 MG tablet Commonly known as: XANAX ! Each morning, one each evening, and 1/2 each afternoon   amLODipine 10 MG tablet Commonly known as: NORVASC TAKE 1 TABLET BY MOUTH EVERY DAY   aspirin EC 81 MG tablet Take 1 tablet (81 mg total) by mouth daily. Swallow whole. Started by: Minh Roanhorse   atorvastatin 80 MG tablet Commonly known as: LIPITOR Take 1 tablet (80 mg total) by mouth daily.   clopidogrel 75 MG tablet Commonly known as: PLAVIX TAKE 1 TABLET BY MOUTH EVERY DAY WITH BREAKFAST   cyanocobalamin 500 MCG tablet Commonly known as: VITAMIN B12 Take 1 tablet (500 mcg total) by mouth daily.   dapagliflozin propanediol 10 MG Tabs tablet Commonly known as: FARXIGA Take 1 tablet (10 mg total) by mouth daily.   diclofenac Sodium 1 % Gel Commonly known as: Voltaren Apply 4 g topically 4 (four) times daily.   doxycycline 100 MG tablet Commonly known as: VIBRA-TABS Take 1 tablet (100 mg total) by mouth 2 (two) times daily. 1 po bid   famotidine 20 MG tablet Commonly known as: PEPCID Take 1 tablet (20 mg total) by mouth at bedtime.   furosemide 20 MG tablet Commonly known as: LASIX Take 1 tablet (20 mg total) by mouth daily.   glipiZIDE 5 MG tablet Commonly known as: GLUCOTROL Take 1 tablet (5 mg total) by mouth daily before breakfast.   Nexlizet  180-10 MG Tabs Generic drug: Bempedoic Acid-Ezetimibe Take 1 tablet by mouth daily. Take 180 mg by mouth daily.   nitroGLYCERIN 0.4 MG SL tablet Commonly known as: NITROSTAT Place 1 tablet (0.4 mg total) under the tongue every 5 (five) minutes as needed for chest pain.   oxybutynin 5 MG 24 hr tablet Commonly known as: DITROPAN-XL Take 1 tablet (5 mg total) by mouth at bedtime.   pantoprazole 40 MG tablet Commonly known as: PROTONIX Take  1 tablet (40 mg total) by mouth daily. For stomach   Premarin vaginal cream Generic drug: conjugated estrogens Use 0.5 gm in vagina nightly for 1 week then 2 x weekly   promethazine-dextromethorphan 6.25-15 MG/5ML syrup Commonly known as: PROMETHAZINE-DM Take 2.5 mLs by mouth at bedtime as needed for cough.   tirzepatide 7.5 MG/0.5ML Pen Commonly known as: MOUNJARO Inject 7.5 mg into the skin once a week.   triamcinolone ointment 0.5 % Commonly known as: KENALOG Apply 1 application. topically 2 (two) times daily.         Follow-up: and for usual DM check every 3 mos  Mechele Claude, M.D.

## 2023-06-20 DIAGNOSIS — I639 Cerebral infarction, unspecified: Secondary | ICD-10-CM | POA: Diagnosis not present

## 2023-06-20 DIAGNOSIS — R5381 Other malaise: Secondary | ICD-10-CM | POA: Diagnosis not present

## 2023-06-20 DIAGNOSIS — R9431 Abnormal electrocardiogram [ECG] [EKG]: Secondary | ICD-10-CM | POA: Diagnosis not present

## 2023-06-20 DIAGNOSIS — G8194 Hemiplegia, unspecified affecting left nondominant side: Secondary | ICD-10-CM | POA: Diagnosis not present

## 2023-06-20 DIAGNOSIS — I700 Atherosclerosis of aorta: Secondary | ICD-10-CM | POA: Diagnosis not present

## 2023-06-20 DIAGNOSIS — R8290 Unspecified abnormal findings in urine: Secondary | ICD-10-CM | POA: Diagnosis not present

## 2023-06-20 DIAGNOSIS — Z7902 Long term (current) use of antithrombotics/antiplatelets: Secondary | ICD-10-CM | POA: Diagnosis not present

## 2023-06-20 DIAGNOSIS — I771 Stricture of artery: Secondary | ICD-10-CM | POA: Diagnosis not present

## 2023-06-20 DIAGNOSIS — Z791 Long term (current) use of non-steroidal anti-inflammatories (NSAID): Secondary | ICD-10-CM | POA: Diagnosis not present

## 2023-06-20 DIAGNOSIS — Z79899 Other long term (current) drug therapy: Secondary | ICD-10-CM | POA: Diagnosis not present

## 2023-06-20 DIAGNOSIS — Z09 Encounter for follow-up examination after completed treatment for conditions other than malignant neoplasm: Secondary | ICD-10-CM | POA: Diagnosis not present

## 2023-06-20 DIAGNOSIS — G459 Transient cerebral ischemic attack, unspecified: Secondary | ICD-10-CM | POA: Diagnosis not present

## 2023-06-20 DIAGNOSIS — Z7982 Long term (current) use of aspirin: Secondary | ICD-10-CM | POA: Diagnosis not present

## 2023-06-20 DIAGNOSIS — R200 Anesthesia of skin: Secondary | ICD-10-CM | POA: Diagnosis not present

## 2023-06-20 DIAGNOSIS — E785 Hyperlipidemia, unspecified: Secondary | ICD-10-CM | POA: Diagnosis not present

## 2023-06-20 DIAGNOSIS — R531 Weakness: Secondary | ICD-10-CM | POA: Diagnosis not present

## 2023-06-20 DIAGNOSIS — I63233 Cerebral infarction due to unspecified occlusion or stenosis of bilateral carotid arteries: Secondary | ICD-10-CM | POA: Diagnosis not present

## 2023-06-20 DIAGNOSIS — Z880 Allergy status to penicillin: Secondary | ICD-10-CM | POA: Diagnosis not present

## 2023-06-20 DIAGNOSIS — Z8673 Personal history of transient ischemic attack (TIA), and cerebral infarction without residual deficits: Secondary | ICD-10-CM | POA: Diagnosis not present

## 2023-06-20 DIAGNOSIS — I6523 Occlusion and stenosis of bilateral carotid arteries: Secondary | ICD-10-CM | POA: Diagnosis not present

## 2023-06-20 DIAGNOSIS — I10 Essential (primary) hypertension: Secondary | ICD-10-CM | POA: Diagnosis not present

## 2023-06-20 DIAGNOSIS — E119 Type 2 diabetes mellitus without complications: Secondary | ICD-10-CM | POA: Diagnosis not present

## 2023-06-21 DIAGNOSIS — G459 Transient cerebral ischemic attack, unspecified: Secondary | ICD-10-CM | POA: Diagnosis not present

## 2023-06-21 DIAGNOSIS — Z0389 Encounter for observation for other suspected diseases and conditions ruled out: Secondary | ICD-10-CM | POA: Diagnosis not present

## 2023-06-21 DIAGNOSIS — E119 Type 2 diabetes mellitus without complications: Secondary | ICD-10-CM | POA: Diagnosis not present

## 2023-06-21 DIAGNOSIS — E785 Hyperlipidemia, unspecified: Secondary | ICD-10-CM | POA: Diagnosis not present

## 2023-06-21 DIAGNOSIS — R8289 Other abnormal findings on cytological and histological examination of urine: Secondary | ICD-10-CM | POA: Diagnosis not present

## 2023-06-21 DIAGNOSIS — I10 Essential (primary) hypertension: Secondary | ICD-10-CM | POA: Diagnosis not present

## 2023-06-21 DIAGNOSIS — I639 Cerebral infarction, unspecified: Secondary | ICD-10-CM | POA: Diagnosis not present

## 2023-06-21 DIAGNOSIS — Z792 Long term (current) use of antibiotics: Secondary | ICD-10-CM | POA: Diagnosis not present

## 2023-06-21 DIAGNOSIS — Z7902 Long term (current) use of antithrombotics/antiplatelets: Secondary | ICD-10-CM | POA: Diagnosis not present

## 2023-06-21 DIAGNOSIS — K219 Gastro-esophageal reflux disease without esophagitis: Secondary | ICD-10-CM | POA: Diagnosis not present

## 2023-06-21 DIAGNOSIS — Z7983 Long term (current) use of bisphosphonates: Secondary | ICD-10-CM | POA: Diagnosis not present

## 2023-06-21 DIAGNOSIS — N3000 Acute cystitis without hematuria: Secondary | ICD-10-CM | POA: Diagnosis not present

## 2023-06-21 DIAGNOSIS — I69951 Hemiplegia and hemiparesis following unspecified cerebrovascular disease affecting right dominant side: Secondary | ICD-10-CM | POA: Diagnosis not present

## 2023-06-21 DIAGNOSIS — E1169 Type 2 diabetes mellitus with other specified complication: Secondary | ICD-10-CM | POA: Diagnosis not present

## 2023-06-24 ENCOUNTER — Ambulatory Visit: Payer: Medicare PPO

## 2023-06-24 ENCOUNTER — Telehealth: Payer: Self-pay

## 2023-06-24 NOTE — Transitions of Care (Post Inpatient/ED Visit) (Signed)
   06/24/2023  Name: VIRDIE BURKHOLDER MRN: [id number] DOB: 04/30/1951  Today's TOC FU Call Status: Today's TOC FU Call Status:: Unsuccessul Call (1st Attempt) Unsuccessful Call (1st Attempt) Date: 06/24/23  Attempted to reach the patient regarding the most recent Inpatient/ED visit.  Follow Up Plan: Additional outreach attempts will be made to reach the patient to complete the Transitions of Care (Post Inpatient/ED visit) call.   Jodelle Gross, RN, BSN, CCM Care Management Coordinator [facility] Phone: [phone]/Fax: [phone]

## 2023-06-25 ENCOUNTER — Telehealth: Payer: Self-pay

## 2023-06-25 NOTE — Transitions of Care (Post Inpatient/ED Visit) (Signed)
06/25/2023  Name: Yvonne [name] MRN: [id number] DOB: February 02, 1951  Today's TOC FU Call Status: Today's TOC FU Call Status:: Successful TOC FU Call Competed TOC FU Call Complete Date: 06/25/23  Transition Care Management Follow-up Telephone Call Date of Discharge: 06/21/23 Discharge Facility: Other ([facility]) Name of Other ([facility]) Discharge Facility: [facility] Type of Discharge: Inpatient Admission Primary Inpatient Discharge Diagnosis:: CVA How have you been since you were released from the hospital?:  (Patients symptoms have completely resolved) Any questions or concerns?: No  Items Reviewed: Did you receive and understand the discharge instructions provided?: Yes Medications obtained,verified, and reconciled?: Partial Review Completed Reason for Partial Mediation Review: Discussed discharge antibiotics Any new allergies since your discharge?: No Dietary orders reviewed?: No Do you have support at home?: Yes People in Home: spouse Name of Support/Comfort Primary Source: Iantha Fallen  Medications Reviewed Today: Medications Reviewed Today     Reviewed by Jodelle Gross, RN (Case Manager) on 06/25/23 at 1414  Med List Status: <None>   Medication Order Taking? Sig Documenting Provider Last Dose Status Informant  ACCU-CHEK GUIDE test strip [id number]  TEST BLOOD SUGAR 4 TIMES DAILY DX E11.69 Mechele Claude, MD  Active   Accu-Chek Softclix Lancets lancets [id number]  Test BS up to 4 times daily Dx E11.69 Mechele Claude, MD  Active Self, Pharmacy Records  albuterol (VENTOLIN HFA) 108 (90 Base) MCG/ACT inhaler [id number]  TAKE 2 PUFFS BY MOUTH EVERY 6 HOURS AS NEEDED FOR WHEEZE OR SHORTNESS OF Dedra Skeens, MD  Active   alendronate (FOSAMAX) 70 MG tablet [id number]  TAKE 1 TABLET EVERY 7 DAYS WITH A FULL GLASS OF WATER ON AN EMPTY STOMACH Mechele Claude, MD  Active   ALPRAZolam Prudy Feeler) 1 MG tablet [id number]  ! Each morning, one each evening, and 1/2 each afternoon Mechele Claude,  MD  Active   amLODipine (NORVASC) 10 MG tablet [id number]  TAKE 1 TABLET BY MOUTH EVERY DAY Mechele Claude, MD  Active   aspirin EC 81 MG tablet [id number]  Take 1 tablet (81 mg total) by mouth daily. Swallow whole. Mechele Claude, MD  Active   atorvastatin (LIPITOR) 80 MG tablet [id number]  Take 1 tablet (80 mg total) by mouth daily. Mechele Claude, MD  Active   Bempedoic Acid-Ezetimibe (NEXLIZET) 180-10 MG TABS [id number]  Take 1 tablet by mouth daily. Take 180 mg by mouth daily. Mechele Claude, MD  Active   Blood Glucose Monitoring Suppl (ACCU-CHEK GUIDE) w/Device Andria Rhein [id number]  Test Bs QID Dx E11.69 Mechele Claude, MD  Active Self, Pharmacy Records  clopidogrel (PLAVIX) 75 MG tablet [id number]  TAKE 1 TABLET BY MOUTH EVERY DAY WITH Aundria Rud, MD  Active   conjugated estrogens (PREMARIN) vaginal cream [id number]  Use 0.5 gm in vagina nightly for 1 week then 2 x weekly Cyril Mourning A, NP  Active   dapagliflozin propanediol (FARXIGA) 10 MG TABS tablet [id number]  Take 1 tablet (10 mg total) by mouth daily. Mechele Claude, MD  Active   diclofenac Sodium (VOLTAREN) 1 % GEL [id number]  Apply 4 g topically 4 (four) times daily. Sonny Masters, FNP  Active   doxycycline (VIBRA-TABS) 100 MG tablet [id number]  Take 1 tablet (100 mg total) by mouth 2 (two) times daily. 1 po bid Daphine Deutscher, Mary-Margaret, FNP  Active   famotidine (PEPCID) 20 MG tablet [id number]  Take 1 tablet (20 mg total) by mouth at bedtime. Mechele Claude, MD  Active   furosemide (LASIX) 20 MG tablet [id number]  Take 1 tablet (  20 mg total) by mouth daily. Mechele Claude, MD  Active   glipiZIDE (GLUCOTROL) 5 MG tablet [id number]  Take 1 tablet (5 mg total) by mouth daily before breakfast. Mechele Claude, MD  Active   nitrofurantoin, macrocrystal-monohydrate, (MACROBID) 100 MG capsule [id number] Yes Take 100 mg by mouth 2 (two) times daily. [provider] Taking Active   nitroGLYCERIN (NITROSTAT) 0.4 MG SL tablet [id number]   Place 1 tablet (0.4 mg total) under the tongue every 5 (five) minutes as needed for chest pain. Christell Constant, MD  Active Self, Pharmacy Records  oxybutynin (DITROPAN-XL) 5 MG 24 hr tablet [id number]  Take 1 tablet (5 mg total) by mouth at bedtime. Mechele Claude, MD  Active   pantoprazole (PROTONIX) 40 MG tablet [id number]  Take 1 tablet (40 mg total) by mouth daily. For stomach Mechele Claude, MD  Active   promethazine [facility]) injection 25 mg [id number]   Bennie Pierini, FNP  Active   promethazine-dextromethorphan (PROMETHAZINE-DM) 6.25-15 MG/5ML syrup [id number]  Take 2.5 mLs by mouth at bedtime as needed for cough. Delynn Flavin M, DO  Active   tirzepatide [facility]) 7.5 MG/0.5ML Pen [id number]  Inject 7.5 mg into the skin once a week. Mechele Claude, MD  Active   triamcinolone ointment (KENALOG) 0.5 % [id number]  Apply 1 application. topically 2 (two) times daily. Gabriel Earing, FNP  Active   vitamin B-12 (CYANOCOBALAMIN) 500 MCG tablet [id number]  Take 1 tablet (500 mcg total) by mouth daily. Catarina Hartshorn, MD  Active             Home Care and Equipment/Supplies: Were Home Health Services Ordered?: No Any new equipment or medical supplies ordered?: No  Functional Questionnaire: Do you need assistance with bathing/showering or dressing?: No Do you need assistance with meal preparation?: No Do you need assistance with eating?: No Do you have difficulty maintaining continence: No Do you need assistance with getting out of bed/getting out of a chair/moving?: No Do you have difficulty managing or taking your medications?: No  Follow up appointments reviewed: PCP Follow-up appointment confirmed?: Yes Date of PCP follow-up appointment?: 08/12/23 (Patient stated she was told to see her PCP sooner than September if she has any concerns) Follow-up Provider: Dr. Darlyn Read Specialist [facility] Follow-up appointment confirmed?: Yes Date of Specialist follow-up appointment?:  09/11/23 Follow-Up Specialty Provider:: Dr. Teresa Coombs Do you need transportation to your follow-up appointment?: No Do you understand care options if your condition(s) worsen?: Yes-patient verbalized understanding  SDOH Interventions Today    Flowsheet Row Most Recent Value  SDOH Interventions   Food Insecurity Interventions Intervention Not Indicated  Housing Interventions Intervention Not Indicated  Transportation Interventions Intervention Not Indicated      Jodelle Gross, RN, BSN, CCM Care Management Coordinator [facility] Phone: [phone]/Fax: [phone]

## 2023-06-28 ENCOUNTER — Telehealth: Payer: Self-pay | Admitting: Family Medicine

## 2023-07-01 ENCOUNTER — Ambulatory Visit: Admission: RE | Admit: 2023-07-01 | Payer: Medicare PPO | Source: Ambulatory Visit

## 2023-07-01 DIAGNOSIS — Z1231 Encounter for screening mammogram for malignant neoplasm of breast: Secondary | ICD-10-CM

## 2023-07-02 ENCOUNTER — Ambulatory Visit (INDEPENDENT_AMBULATORY_CARE_PROVIDER_SITE_OTHER): Payer: Medicare PPO | Admitting: Family Medicine

## 2023-07-02 ENCOUNTER — Encounter: Payer: Self-pay | Admitting: Family Medicine

## 2023-07-02 VITALS — BP 121/74 | HR 73 | Temp 97.9°F | Ht 62.0 in | Wt 154.4 lb

## 2023-07-02 DIAGNOSIS — I6381 Other cerebral infarction due to occlusion or stenosis of small artery: Secondary | ICD-10-CM

## 2023-07-02 DIAGNOSIS — I6930 Unspecified sequelae of cerebral infarction: Secondary | ICD-10-CM

## 2023-07-02 DIAGNOSIS — Z8673 Personal history of transient ischemic attack (TIA), and cerebral infarction without residual deficits: Secondary | ICD-10-CM

## 2023-07-02 NOTE — Telephone Encounter (Signed)
Reviewed with pt. Today at ofice visit

## 2023-07-03 ENCOUNTER — Encounter: Payer: Self-pay | Admitting: Family Medicine

## 2023-07-03 MED ORDER — APIXABAN 5 MG PO TABS: 5.0000 mg | ORAL_TABLET | Freq: Two times a day (BID) | ORAL | 2 refills | Status: DC

## 2023-07-03 NOTE — Progress Notes (Signed)
Subjective:  Patient ID: Yvonne [name], female    DOB: 24-Nov-1951  Age: 72 y.o. MRN: [id number]  CC: ER FOLLOW UP   HPI Yvonne [name] presents for having had TIA on left side and went to E.D. several day ago. 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 Pt. Returns today having no left sided sx. All of those have esolved, but she is having right hand tingling intermittently. Multiple risk factors for stroke including previous stroke are noted.      07/02/2023   11:05 AM 07/02/2023   11:00 AM 06/19/2023    9:30 AM  Depression screen PHQ 2/9  Decreased Interest 2 0 2  Down, Depressed, Hopeless 3 0 1  PHQ - 2 Score 5 0 3  Altered sleeping 3  3  Tired, decreased energy 0  0  Change in appetite 2  2  Feeling bad or failure about yourself  3  3  Trouble concentrating 1  0  Moving slowly or fidgety/restless 3  2  Suicidal thoughts 0  0  PHQ-9 Score 17  13  Difficult doing work/chores Somewhat difficult  Somewhat difficult    History Yvonne [name] has a past medical history of (HFpEF) heart failure with preserved ejection fraction (HCC), Anxiety, Aortic atherosclerosis (HCC), Arthritis, Asthma, CAD (coronary artery disease), Carotid artery disease (HCC), Chronic cough, Chronic otitis media (12/2017), Full dentures, GERD (gastroesophageal reflux disease), History of stroke (09/2017), Hyperlipidemia, Hypertension, Non-insulin dependent type 2 diabetes mellitus (HCC), Orthostatic hypotension, Overactive bladder, PSVT (paroxysmal supraventricular tachycardia), Recurrent acute suppurative otitis media without spontaneous rupture of left tympanic membrane (02/19/2018), Stroke (HCC), and Transient cerebral ischemia.   She has a past surgical history that includes Cholecystectomy; Colonoscopy (N/A, 08/17/2015); Abdominal hysterectomy; Lacrimal duct exploration (Bilateral); Cataract extraction w/ intraocular lens implant (Right); Vitrectomy (Left, 09/14/2015); Gas insertion (Right); and Myringotomy with  tube placement (Bilateral, 01/28/2018).   Her family history includes Arthritis in her brother and mother; Arthritis-Osteo in her son; Asthma in her father; Breast cancer in her mother; CVA in her mother; Cancer in her brother; Diabetes in her father and mother; Heart disease in her father; Hepatitis C in her sister; Peripheral Artery Disease in her daughter; Stroke in her mother.She reports that she has never smoked. She has never used smokeless tobacco. She reports that she does not currently use alcohol. She reports that she does not use drugs.    ROS Review of Systems  Constitutional: Negative.   HENT: Negative.    Eyes:  Negative for visual disturbance.  Respiratory:  Negative for shortness of breath.   Cardiovascular:  Negative for chest pain.  Gastrointestinal:  Negative for abdominal pain.  Musculoskeletal:  Negative for arthralgias.    Objective:  BP 121/74   Pulse 73   Temp 97.9 F (36.6 C)   Ht 5\' 2"  (1.575 m)   Wt 154 lb 6.4 oz (70 kg)   SpO2 97%   BMI 28.24 kg/m   BP Readings from Last 3 Encounters:  07/02/23 121/74  06/19/23 (!) 149/81  05/09/23 (!) 145/80    Wt Readings from Last 3 Encounters:  07/02/23 154 lb 6.4 oz (70 kg)  06/19/23 153 lb 9.6 oz (69.7 kg)  05/09/23 156 lb 6.4 oz (70.9 kg)     Physical Exam Constitutional:      General: She is not in acute distress.    Appearance: She is well-developed.  Cardiovascular:     Rate and Rhythm: Normal rate and regular rhythm.  Pulmonary:     Breath sounds: Normal breath sounds.  Musculoskeletal:        General: Normal range of motion.  Skin:    General: Skin is warm and dry.  Neurological:     Mental Status: She is alert and oriented to person, place, and time.       Assessment & Plan:   Yvonne [name] was seen today for er follow up.  Diagnoses and all orders for this visit:  Lacunar infarction [facility]) -     US Carotid Bilateral; Future -     MR Angiogram Head Wo Contrast; Future  History of  recurrent TIAs -     US Carotid Bilateral; Future -     MR Angiogram Head Wo Contrast; Future  Other orders -     apixaban (ELIQUIS) 5 MG TABS tablet; Take 1 tablet (5 mg total) by mouth 2 (two) times daily. For TIA       I have discontinued Yvonne [name]. Yvonne [name]'s clopidogrel and aspirin EC. I am also having her start on apixaban. Additionally, I am having her maintain her Accu-Chek Softclix Lancets, nitroGLYCERIN, Accu-Chek Guide, cyanocobalamin, triamcinolone ointment, oxybutynin, famotidine, diclofenac Sodium, albuterol, dapagliflozin propanediol, pantoprazole, Premarin, alendronate, promethazine-dextromethorphan, amLODipine, glipiZIDE, atorvastatin, Nexlizet, Accu-Chek Guide, doxycycline, tirzepatide, ALPRAZolam, and furosemide. We will continue to administer promethazine.  Allergies as of 07/02/2023       Reactions   Penicillins Hives, Itching   Has patient had a PCN reaction causing immediate rash, facial/tongue/throat swelling, SOB or lightheadedness with hypotension: Yes Has patient had a PCN reaction causing severe rash involving mucus membranes or skin necrosis: Unk Has patient had a PCN reaction that required hospitalization: No Has patient had a PCN reaction occurring within the last 10 years: No If all of the above answers are "NO", then may proceed with Cephalosporin use. Has patient had a PCN reaction causing immediate rash, facial/tongue/throat swelling, SOB or lightheadedness with hypotension: Yes Has patient had a PCN reaction causing severe rash involving mucus membranes or skin necrosis: No Has patient had a PCN reaction that required hospitalization No Has patient had a PCN reaction occurring within the last 10 years: No If all of the above answers are "NO", then may proceed with Cephalosporin use. Has patient had a PCN reaction causing immediate rash, facial/tongue/throat swelling, SOB or lightheadedness with hypotension: Yes Has patient had a PCN reaction causing severe  rash involving mucus membranes or skin necrosis: Unk Has patient had a PCN reaction that required hospitalization: No Has patient had a PCN reaction occurring within the last 10 years: No If all of the above answers are "NO", then may proceed with Cephalosporin use. Has patient had a PCN reaction causing immediate rash, facial/tongue/throat swelling, SOB or lightheadedness with hypotension: Yes Has patient had a PCN reaction causing severe rash involving mucus membranes or skin necrosis: No Has patient had a PCN reaction that required hospitalization No Has patient had a PCN reaction occurring within the last 10 years: No If all of the above answers are "NO", then may proceed with Cephalosporin use. Has patient had a PCN reaction causing immediate rash, facial/tongue/throat swelling, SOB or lightheadedness with hypotension: Yes Has patient had a PCN reaction causing sev... (TRUNCATED)   Metformin And Related Diarrhea   dizziness   Gabapentin Other (See Comments)   Causes a lot of lethargy at a higher dose Causes a lot of lethargy at a higher dose Causes a lot of lethargy at a higher dose   Lisinopril Cough  HAS A CHRONIC COUGH AND DID NOT NOTICE IMPROVEMENT OF COUGH WHEN LISINOPRIL WAS STOPPED HAS A CHRONIC COUGH AND DID NOT NOTICE IMPROVEMENT OF COUGH WHEN LISINOPRIL WAS STOPPED HAS A CHRONIC COUGH AND DID NOT NOTICE IMPROVEMENT OF COUGH WHEN LISINOPRIL WAS STOPPED   Soap Rash   DIAL soap        Medication List        Accurate as of July 02, 2023 11:59 PM. If you have any questions, ask your nurse or doctor.          STOP taking these medications    aspirin EC 81 MG tablet Stopped by: Broadus John    clopidogrel 75 MG tablet Commonly known as: PLAVIX Stopped by:         TAKE these medications    Accu-Chek Guide test strip Generic drug: glucose blood TEST BLOOD SUGAR 4 TIMES DAILY DX E11.69   Accu-Chek Guide w/Device Kit Test Bs QID Dx E11.69    Accu-Chek Softclix Lancets lancets Test BS up to 4 times daily Dx E11.69   albuterol 108 (90 Base) MCG/ACT inhaler Commonly known as: VENTOLIN HFA TAKE 2 PUFFS BY MOUTH EVERY 6 HOURS AS NEEDED FOR WHEEZE OR SHORTNESS OF BREATH   alendronate 70 MG tablet Commonly known as: FOSAMAX TAKE 1 TABLET EVERY 7 DAYS WITH A FULL GLASS OF WATER ON AN EMPTY STOMACH   ALPRAZolam 1 MG tablet Commonly known as: XANAX ! Each morning, one each evening, and 1/2 each afternoon   amLODipine 10 MG tablet Commonly known as: NORVASC TAKE 1 TABLET BY MOUTH EVERY DAY   apixaban 5 MG Tabs tablet Commonly known as: Eliquis Take 1 tablet (5 mg total) by mouth 2 (two) times daily. For TIA Started by:     atorvastatin 80 MG tablet Commonly known as: LIPITOR Take 1 tablet (80 mg total) by mouth daily.   cyanocobalamin 500 MCG tablet Commonly known as: VITAMIN B12 Take 1 tablet (500 mcg total) by mouth daily.   dapagliflozin propanediol 10 MG Tabs tablet Commonly known as: FARXIGA Take 1 tablet (10 mg total) by mouth daily.   diclofenac Sodium 1 % Gel Commonly known as: Voltaren Apply 4 g topically 4 (four) times daily.   doxycycline 100 MG tablet Commonly known as: VIBRA-TABS Take 1 tablet (100 mg total) by mouth 2 (two) times daily. 1 po bid   famotidine 20 MG tablet Commonly known as: PEPCID Take 1 tablet (20 mg total) by mouth at bedtime.   furosemide 20 MG tablet Commonly known as: LASIX Take 1 tablet (20 mg total) by mouth daily.   glipiZIDE 5 MG tablet Commonly known as: GLUCOTROL Take 1 tablet (5 mg total) by mouth daily before breakfast.   Nexlizet 180-10 MG Tabs Generic drug: Bempedoic Acid-Ezetimibe Take 1 tablet by mouth daily. Take 180 mg by mouth daily.   nitroGLYCERIN 0.4 MG SL tablet Commonly known as: NITROSTAT Place 1 tablet (0.4 mg total) under the tongue every 5 (five) minutes as needed for chest pain.   oxybutynin 5 MG 24 hr tablet Commonly known as:  DITROPAN-XL Take 1 tablet (5 mg total) by mouth at bedtime.   pantoprazole 40 MG tablet Commonly known as: PROTONIX Take 1 tablet (40 mg total) by mouth daily. For stomach   Premarin vaginal cream Generic drug: conjugated estrogens Use 0.5 gm in vagina nightly for 1 week then 2 x weekly   promethazine-dextromethorphan 6.25-15 MG/5ML syrup Commonly known as: PROMETHAZINE-DM Take 2.5 mLs by mouth at bedtime  as needed for cough.   tirzepatide 7.5 MG/0.5ML Pen Commonly known as: MOUNJARO Inject 7.5 mg into the skin once a week.   triamcinolone ointment 0.5 % Commonly known as: KENALOG Apply 1 application. topically 2 (two) times daily.         Follow-up: Return in about 1 month (around 08/02/2023).  Mechele Claude, M.D.

## 2023-07-09 ENCOUNTER — Other Ambulatory Visit: Payer: Self-pay | Admitting: Family Medicine

## 2023-07-09 DIAGNOSIS — I10 Essential (primary) hypertension: Secondary | ICD-10-CM | POA: Diagnosis not present

## 2023-07-09 DIAGNOSIS — Z1330 Encounter for screening examination for mental health and behavioral disorders, unspecified: Secondary | ICD-10-CM | POA: Diagnosis not present

## 2023-07-09 DIAGNOSIS — B9689 Other specified bacterial agents as the cause of diseases classified elsewhere: Secondary | ICD-10-CM

## 2023-07-09 DIAGNOSIS — G459 Transient cerebral ischemic attack, unspecified: Secondary | ICD-10-CM | POA: Diagnosis not present

## 2023-07-09 DIAGNOSIS — Z8673 Personal history of transient ischemic attack (TIA), and cerebral infarction without residual deficits: Secondary | ICD-10-CM | POA: Diagnosis not present

## 2023-07-16 DIAGNOSIS — Z8673 Personal history of transient ischemic attack (TIA), and cerebral infarction without residual deficits: Secondary | ICD-10-CM | POA: Diagnosis not present

## 2023-07-16 DIAGNOSIS — I10 Essential (primary) hypertension: Secondary | ICD-10-CM | POA: Diagnosis not present

## 2023-07-16 DIAGNOSIS — G459 Transient cerebral ischemic attack, unspecified: Secondary | ICD-10-CM | POA: Diagnosis not present

## 2023-07-16 DIAGNOSIS — I6389 Other cerebral infarction: Secondary | ICD-10-CM | POA: Diagnosis not present

## 2023-07-22 ENCOUNTER — Encounter (HOSPITAL_COMMUNITY): Payer: Self-pay

## 2023-07-22 ENCOUNTER — Ambulatory Visit (HOSPITAL_COMMUNITY): Payer: Medicare PPO

## 2023-08-05 ENCOUNTER — Encounter: Payer: Self-pay | Admitting: Nurse Practitioner

## 2023-08-05 ENCOUNTER — Ambulatory Visit (INDEPENDENT_AMBULATORY_CARE_PROVIDER_SITE_OTHER): Payer: Medicare PPO | Admitting: Nurse Practitioner

## 2023-08-05 VITALS — BP 133/73 | HR 73 | Temp 97.6°F | Ht 62.0 in | Wt 156.0 lb

## 2023-08-05 DIAGNOSIS — R490 Dysphonia: Secondary | ICD-10-CM

## 2023-08-05 LAB — CULTURE, GROUP A STREP

## 2023-08-05 LAB — RAPID STREP SCREEN (MED CTR MEBANE ONLY): Strep Gp A Ag, IA W/Reflex: NEGATIVE

## 2023-08-05 MED ORDER — BENZONATATE 100 MG PO CAPS: 100.0000 mg | ORAL_CAPSULE | Freq: Two times a day (BID) | ORAL | 0 refills | Status: DC | PRN

## 2023-08-05 NOTE — Patient Instructions (Signed)

## 2023-08-05 NOTE — Progress Notes (Signed)
Subjective:    Patient ID: Yvonne [name], female    DOB: 11-Nov-1951, 72 y.o.   MRN: [id number]   Chief Complaint: Hoarse   HPI Patient come sin today c/o hoarseness for 2 days. Slight sore throat and low grade fever of 99. She always has cough and congestion but is a little worse.  Patient Active Problem List   Diagnosis Date Noted   S/P hysterectomy with oophorectomy 11/23/2022   SUI (stress urinary incontinence, female) 11/23/2022   Dyspareunia in female 11/23/2022   Depression, major, single episode, severe (HCC) 04/10/2022   Peripheral edema 04/10/2022   History of stroke 01/24/2022   Coronary artery disease involving native coronary artery of native heart without angina pectoris 01/24/2022   TIA (transient ischemic attack) 01/18/2022   Uncontrolled type 2 diabetes mellitus with hyperglycemia, without long-term current use of insulin (HCC) 01/17/2022   Sensory disturbance 01/17/2022   Syncope and collapse 01/16/2022   Restless legs 02/27/2021   Insomnia 02/27/2021   Polyneuropathy due to type 2 diabetes mellitus (HCC) 02/27/2021   Palpitations 01/20/2021   Aortic atherosclerosis (HCC) 01/20/2021   Orthostatic hypotension 01/20/2021   Carpal tunnel syndrome 08/23/2020   Diabetic lipidosis (HCC) 09/09/2019   Hypokalemia 12/11/2018   Benzodiazepine dependence, continuous (HCC) 10/22/2018   Chronic migraine without aura without status migrainosus, not intractable 02/19/2018   (HFpEF) heart failure with preserved ejection fraction (HCC) 03/21/2017   Chronic bilateral low back pain with bilateral sciatica 02/27/2017   Spondylosis of lumbar region without myelopathy or radiculopathy 08/15/2016   Pain of both hip joints 06/18/2016   Pain syndrome, chronic 06/18/2016   Chronic cough 05/17/2016   HLD (hyperlipidemia) 11/04/2015   Macular hole of right eye 09/14/2015   Obesity 07/20/2015   Osteopenia 07/20/2015   Precordial pain 07/06/2015   Overactive bladder    Hypertension     GAD (generalized anxiety disorder)    Acid reflux disease 08/17/2014       Review of Systems  Constitutional:  Negative for diaphoresis.  Eyes:  Negative for pain.  Respiratory:  Negative for shortness of breath.   Cardiovascular:  Negative for chest pain, palpitations and leg swelling.  Gastrointestinal:  Negative for abdominal pain.  Endocrine: Negative for polydipsia.  Skin:  Negative for rash.  Neurological:  Negative for dizziness, weakness and headaches.  Hematological:  Does not bruise/bleed easily.  All other systems reviewed and are negative.      Objective:   Physical Exam Vitals and nursing note reviewed.  Constitutional:      General: She is not in acute distress.    Appearance: Normal appearance. She is well-developed.     Comments: Voice hoarse  HENT:     Head: Normocephalic.     Right Ear: Tympanic membrane normal.     Left Ear: Tympanic membrane normal.     Nose: Nose normal.     Mouth/Throat:     Mouth: Mucous membranes are moist.  Eyes:     Pupils: Pupils are equal, round, and reactive to light.  Neck:     Vascular: No carotid bruit or JVD.  Cardiovascular:     Rate and Rhythm: Normal rate and regular rhythm.     Heart sounds: Normal heart sounds.  Pulmonary:     Effort: Pulmonary effort is normal. No respiratory distress.     Breath sounds: Wheezing (faint expiratory wheezes) present. No rales.  Chest:     Chest wall: No tenderness.  Abdominal:  General: Bowel sounds are normal. There is no distension or abdominal bruit.     Palpations: Abdomen is soft. There is no hepatomegaly, splenomegaly, mass or pulsatile mass.     Tenderness: There is no abdominal tenderness.  Musculoskeletal:        General: Normal range of motion.     Cervical back: Normal range of motion and neck supple.  Lymphadenopathy:     Cervical: No cervical adenopathy.  Skin:    General: Skin is warm and dry.  Neurological:     Mental Status: She is alert and oriented  to person, place, and time.     Deep Tendon Reflexes: Reflexes are normal and symmetric.  Psychiatric:        Behavior: Behavior normal.        Thought Content: Thought content normal.        Judgment: Judgment normal.    BP 133/73   Pulse 73   Temp 97.6 F (36.4 C) (Temporal)   Ht 5\' 2"  (1.575 m)   Wt 156 lb (70.8 kg)   SpO2 97%   BMI 28.53 kg/m         Assessment & Plan:   Yvonne [name] in today with chief complaint of Hoarse   1. Hoarseness 1. Take meds as prescribed 2. Use a cool mist humidifier especially during the winter months and when heat has been humid. 3. Use saline nose sprays frequently 4. Saline irrigations of the nose can be very helpful if done frequently.  * 4X daily for 1 week*  * Use of a nettie pot can be helpful with this. Follow directions with this* 5. Drink plenty of fluids 6. Keep thermostat turn down low 7.For any cough or congestion- tessalon perles 8. For fever or aces or pains- take tylenol or ibuprofen appropriate for age and weight.  * for fevers greater than 101 orally you may alternate ibuprofen and tylenol every  3 hours.    - Rapid Strep Screen ([facility] ONLY) - Novel Coronavirus, NAA ([facility])    The above assessment and management plan was discussed with the patient. The patient verbalized understanding of and has agreed to the management plan. Patient is aware to call the clinic if symptoms persist or worsen. Patient is aware when to return to the clinic for a follow-up visit. Patient educated on when it is appropriate to go to the emergency department.   [name], FNP

## 2023-08-06 LAB — NOVEL CORONAVIRUS, NAA: SARS-CoV-2, NAA: NOT DETECTED

## 2023-08-08 ENCOUNTER — Ambulatory Visit: Payer: Medicare PPO | Admitting: Family Medicine

## 2023-08-09 ENCOUNTER — Ambulatory Visit (INDEPENDENT_AMBULATORY_CARE_PROVIDER_SITE_OTHER): Payer: Medicare PPO | Admitting: Nurse Practitioner

## 2023-08-09 ENCOUNTER — Encounter: Payer: Self-pay | Admitting: Nurse Practitioner

## 2023-08-09 ENCOUNTER — Telehealth: Payer: Self-pay | Admitting: Family Medicine

## 2023-08-09 VITALS — BP 95/68 | HR 95 | Temp 98.7°F | Ht 62.0 in | Wt 155.6 lb

## 2023-08-09 DIAGNOSIS — J40 Bronchitis, not specified as acute or chronic: Secondary | ICD-10-CM | POA: Diagnosis not present

## 2023-08-09 MED ORDER — AZITHROMYCIN 250 MG PO TABS: ORAL_TABLET | ORAL | 0 refills | Status: DC

## 2023-08-09 MED ORDER — HYDROCOD POLI-CHLORPHE POLI ER 10-8 MG/5ML PO SUER: 5.0000 mL | Freq: Two times a day (BID) | ORAL | 0 refills | Status: DC | PRN

## 2023-08-09 MED ORDER — PREDNISONE 20 MG PO TABS: 40.0000 mg | ORAL_TABLET | Freq: Every day | ORAL | 0 refills | Status: AC

## 2023-08-09 NOTE — Patient Instructions (Signed)

## 2023-08-09 NOTE — Telephone Encounter (Signed)
Sent to corrected pharmacy

## 2023-08-09 NOTE — Telephone Encounter (Signed)
Pt aware.

## 2023-08-09 NOTE — Progress Notes (Signed)
Subjective:    Patient ID: Yvonne [name], female    DOB: 05-05-1951, 72 y.o.   MRN: [id number]   Chief Complaint: Nasal Congestion and Eye Drainage   Patient was seen on 08/05/23 with cough and congestion. Was dx with viral URI. Covid came back negative. She says she has gotten worse. Cough is constant and productive. Eyes are matted together every morning.    Patient Active Problem List   Diagnosis Date Noted   S/P hysterectomy with oophorectomy 11/23/2022   SUI (stress urinary incontinence, female) 11/23/2022   Dyspareunia in female 11/23/2022   Depression, major, single episode, severe (HCC) 04/10/2022   Peripheral edema 04/10/2022   History of stroke 01/24/2022   Coronary artery disease involving native coronary artery of native heart without angina pectoris 01/24/2022   TIA (transient ischemic attack) 01/18/2022   Uncontrolled type 2 diabetes mellitus with hyperglycemia, without long-term current use of insulin (HCC) 01/17/2022   Sensory disturbance 01/17/2022   Syncope and collapse 01/16/2022   Restless legs 02/27/2021   Insomnia 02/27/2021   Polyneuropathy due to type 2 diabetes mellitus (HCC) 02/27/2021   Palpitations 01/20/2021   Aortic atherosclerosis (HCC) 01/20/2021   Orthostatic hypotension 01/20/2021   Carpal tunnel syndrome 08/23/2020   Diabetic lipidosis (HCC) 09/09/2019   Hypokalemia 12/11/2018   Benzodiazepine dependence, continuous (HCC) 10/22/2018   Chronic migraine without aura without status migrainosus, not intractable 02/19/2018   (HFpEF) heart failure with preserved ejection fraction (HCC) 03/21/2017   Chronic bilateral low back pain with bilateral sciatica 02/27/2017   Spondylosis of lumbar region without myelopathy or radiculopathy 08/15/2016   Pain of both hip joints 06/18/2016   Pain syndrome, chronic 06/18/2016   Chronic cough 05/17/2016   HLD (hyperlipidemia) 11/04/2015   Macular hole of right eye 09/14/2015   Obesity 07/20/2015   Osteopenia  07/20/2015   Precordial pain 07/06/2015   Overactive bladder    Hypertension    GAD (generalized anxiety disorder)    Acid reflux disease 08/17/2014       Review of Systems  Constitutional:  Positive for chills and fever (103).  HENT:  Positive for congestion, rhinorrhea and sore throat.   Respiratory:  Positive for cough.        Objective:   Physical Exam Constitutional:      Appearance: Normal appearance.  HENT:     Right Ear: Tympanic membrane normal.     Left Ear: Tympanic membrane normal.     Nose: Congestion and rhinorrhea present.     Mouth/Throat:     Pharynx: No posterior oropharyngeal erythema.  Cardiovascular:     Rate and Rhythm: Normal rate and regular rhythm.     Heart sounds: Normal heart sounds.  Pulmonary:     Breath sounds: Normal breath sounds.     Comments: Dry cough Musculoskeletal:     Cervical back: Normal range of motion and neck supple. No rigidity.  Lymphadenopathy:     Cervical: No cervical adenopathy.  Skin:    General: Skin is warm.  Neurological:     General: No focal deficit present.     Mental Status: She is alert and oriented to person, place, and time.  Psychiatric:        Mood and Affect: Mood normal.        Behavior: Behavior normal.    BP 95/68   Pulse 95   Temp 98.7 F (37.1 C)   Ht 5\' 2"  (1.575 m)   Wt 155 lb 9.6 oz (70.6  kg)   SpO2 99%   BMI 28.46 kg/m         Assessment & Plan:   Yvonne [name] in today with chief complaint of Nasal Congestion and Eye Drainage   1. Bronchitis 1. Take meds as prescribed 2. Use a cool mist humidifier especially during the winter months and when heat has been humid. 3. Use saline nose sprays frequently 4. Saline irrigations of the nose can be very helpful if done frequently.  * 4X daily for 1 week*  * Use of a nettie pot can be helpful with this. Follow directions with this* 5. Drink plenty of fluids 6. Keep thermostat turn down low 7.For any cough or congestion-  tussionex 8. For fever or aces or pains- take tylenol or ibuprofen appropriate for age and weight.  * for fevers greater than 101 orally you may alternate ibuprofen and tylenol every  3 hours.   Meds ordered this encounter  Medications   azithromycin (ZITHROMAX Z-PAK) 250 MG tablet    Sig: As directed    Dispense:  6 tablet    Refill:  0    Order Specific Question:   Supervising Provider    Answer:   Arville Care A [[id number]]   predniSONE (DELTASONE) 20 MG tablet    Sig: Take 2 tablets (40 mg total) by mouth daily with breakfast for 5 days. 2 po daily for 5 days    Dispense:  10 tablet    Refill:  0    Order Specific Question:   Supervising Provider    Answer:   Arville Care A [[id number]]   chlorpheniramine-HYDROcodone (TUSSIONEX) 10-8 MG/5ML    Sig: Take 5 mLs by mouth every 12 (twelve) hours as needed for cough.    Dispense:  120 mL    Refill:  0    Order Specific Question:   Supervising Provider    Answer:   Arville Care A [[id number]]       The above assessment and management plan was discussed with the patient. The patient verbalized understanding of and has agreed to the management plan. Patient is aware to call the clinic if symptoms persist or worsen. Patient is aware when to return to the clinic for a follow-up visit. Patient educated on when it is appropriate to go to the emergency department.   Yvonne Daphine Deutscher, FNP

## 2023-08-09 NOTE — Telephone Encounter (Signed)
Pt called to let MMM that her pharmacy doesn't have a Zpak Rx in stock but she called around and found that [facility] does have in stock. Wants MMM to send her Zpak Rx to [facility].

## 2023-08-12 ENCOUNTER — Ambulatory Visit (INDEPENDENT_AMBULATORY_CARE_PROVIDER_SITE_OTHER): Payer: Medicare PPO

## 2023-08-12 ENCOUNTER — Encounter: Payer: Self-pay | Admitting: Family Medicine

## 2023-08-12 ENCOUNTER — Other Ambulatory Visit: Payer: Self-pay | Admitting: Family Medicine

## 2023-08-12 ENCOUNTER — Ambulatory Visit (INDEPENDENT_AMBULATORY_CARE_PROVIDER_SITE_OTHER): Payer: Medicare PPO | Admitting: Family Medicine

## 2023-08-12 VITALS — BP 130/67 | HR 71 | Temp 98.0°F | Ht 62.0 in | Wt 153.0 lb

## 2023-08-12 DIAGNOSIS — R059 Cough, unspecified: Secondary | ICD-10-CM | POA: Diagnosis not present

## 2023-08-12 DIAGNOSIS — E782 Mixed hyperlipidemia: Secondary | ICD-10-CM | POA: Diagnosis not present

## 2023-08-12 DIAGNOSIS — E1122 Type 2 diabetes mellitus with diabetic chronic kidney disease: Secondary | ICD-10-CM | POA: Diagnosis not present

## 2023-08-12 DIAGNOSIS — I10 Essential (primary) hypertension: Secondary | ICD-10-CM

## 2023-08-12 DIAGNOSIS — J449 Chronic obstructive pulmonary disease, unspecified: Secondary | ICD-10-CM | POA: Diagnosis not present

## 2023-08-12 DIAGNOSIS — N1831 Chronic kidney disease, stage 3a: Secondary | ICD-10-CM | POA: Diagnosis not present

## 2023-08-12 DIAGNOSIS — Z7985 Long-term (current) use of injectable non-insulin antidiabetic drugs: Secondary | ICD-10-CM | POA: Insufficient documentation

## 2023-08-12 DIAGNOSIS — J40 Bronchitis, not specified as acute or chronic: Secondary | ICD-10-CM

## 2023-08-12 DIAGNOSIS — R0600 Dyspnea, unspecified: Secondary | ICD-10-CM | POA: Diagnosis not present

## 2023-08-12 LAB — BAYER DCA HB A1C WAIVED: HB A1C (BAYER DCA - WAIVED): 6 % — ABNORMAL HIGH (ref 4.8–5.6)

## 2023-08-12 MED ORDER — ALENDRONATE SODIUM 70 MG PO TABS: 70.0000 mg | ORAL_TABLET | ORAL | 3 refills | Status: DC

## 2023-08-12 MED ORDER — OXYBUTYNIN CHLORIDE ER 5 MG PO TB24: 5.0000 mg | ORAL_TABLET | Freq: Every day | ORAL | 3 refills | Status: AC

## 2023-08-12 NOTE — Progress Notes (Signed)
Subjective:  Patient ID: Yvonne [name], female    DOB: [birth year]  Age: 72 y.o. MRN: [id number]  CC: Medical Management of Chronic Issues   HPI [name] presents for  Follow-up of diabetes. Patient checks blood sugar at home.   ON prednisone 40 mg a day for 5 days has taken X 3 so far. Cough is productive. Also taking Z-Pack. Glucose went to 175  this AM Patient denies symptoms such as polyuria, polydipsia, excessive hunger, nausea No significant hypoglycemic spells noted. Medications reviewed. Pt reports taking them regularly without complication/adverse reaction being reported today.    History Amiee has a past medical history of (HFpEF) heart failure with preserved ejection fraction (HCC), Anxiety, Aortic atherosclerosis (HCC), Arthritis, Asthma, CAD (coronary artery disease), Carotid artery disease (HCC), Chronic cough, Chronic otitis media (12/2017), Full dentures, GERD (gastroesophageal reflux disease), History of stroke (09/2017), Hyperlipidemia, Hypertension, Non-insulin dependent type 2 diabetes mellitus (HCC), Orthostatic hypotension, Overactive bladder, PSVT (paroxysmal supraventricular tachycardia), Recurrent acute suppurative otitis media without spontaneous rupture of left tympanic membrane (02/19/2018), Stroke (HCC), and Transient cerebral ischemia.   She has a past surgical history that includes Cholecystectomy; Colonoscopy (N/A, 08/17/2015); Abdominal hysterectomy; Lacrimal duct exploration (Bilateral); Cataract extraction w/ intraocular lens implant (Right); Vitrectomy (Left, 09/14/2015); Gas insertion (Right); and Myringotomy with tube placement (Bilateral, 01/28/2018).   Her family history includes Arthritis in her brother and mother; Arthritis-Osteo in her son; Asthma in her father; Breast cancer in her mother; CVA in her mother; Cancer in her brother; Diabetes in her father and mother; Heart disease in her father; Hepatitis C in her sister; Peripheral Artery Disease in  her daughter; Stroke in her mother.She reports that she has never smoked. She has never used smokeless tobacco. She reports that she does not currently use alcohol. She reports that she does not use drugs.  Current Outpatient Medications on File Prior to Visit  Medication Sig Dispense Refill   ACCU-CHEK GUIDE test strip TEST BLOOD SUGAR 4 TIMES DAILY DX E11.69 400 strip 3   Accu-Chek Softclix Lancets lancets Test BS up to 4 times daily Dx E11.69 400 each 3   albuterol (VENTOLIN HFA) 108 (90 Base) MCG/ACT inhaler TAKE 2 PUFFS BY MOUTH EVERY 6 HOURS AS NEEDED FOR WHEEZE OR SHORTNESS OF BREATH 54 each 1   ALPRAZolam (XANAX) 1 MG tablet ! Each morning, one each evening, and 1/2 each afternoon 75 tablet 5   amLODipine (NORVASC) 10 MG tablet TAKE 1 TABLET BY MOUTH EVERY DAY 90 tablet 3   apixaban (ELIQUIS) 5 MG TABS tablet Take 1 tablet (5 mg total) by mouth 2 (two) times daily. For TIA 60 tablet 2   atorvastatin (LIPITOR) 80 MG tablet Take 1 tablet (80 mg total) by mouth daily. 90 tablet 2   azithromycin (ZITHROMAX Z-PAK) 250 MG tablet As directed 6 tablet 0   Bempedoic Acid-Ezetimibe (NEXLIZET) 180-10 MG TABS Take 1 tablet by mouth daily. Take 180 mg by mouth daily. 90 tablet 3   benzonatate (TESSALON) 100 MG capsule Take 1 capsule (100 mg total) by mouth 2 (two) times daily as needed for cough. 20 capsule 0   Blood Glucose Monitoring Suppl (ACCU-CHEK GUIDE) w/Device KIT Test Bs QID Dx E11.69 1 kit 0   chlorpheniramine-HYDROcodone (TUSSIONEX) 10-8 MG/5ML Take 5 mLs by mouth every 12 (twelve) hours as needed for cough. 120 mL 0   clopidogrel (PLAVIX) 75 MG tablet TAKE 1 TABLET BY MOUTH EVERY DAY WITH BREAKFAST 90 tablet 0   conjugated  estrogens (PREMARIN) vaginal cream Use 0.5 gm in vagina nightly for 1 week then 2 x weekly 12 g 0   dapagliflozin propanediol (FARXIGA) 10 MG TABS tablet Take 1 tablet (10 mg total) by mouth daily. 90 tablet 5   diclofenac Sodium (VOLTAREN) 1 % GEL Apply 4 g topically 4  (four) times daily. 350 g 0   famotidine (PEPCID) 20 MG tablet TAKE 1 TABLET BY MOUTH EVERYDAY AT BEDTIME 90 tablet 0   furosemide (LASIX) 20 MG tablet Take 1 tablet (20 mg total) by mouth daily. 90 tablet 1   glipiZIDE (GLUCOTROL) 5 MG tablet Take 1 tablet (5 mg total) by mouth daily before breakfast. 90 tablet 3   nitroGLYCERIN (NITROSTAT) 0.4 MG SL tablet Place 1 tablet (0.4 mg total) under the tongue every 5 (five) minutes as needed for chest pain. 25 tablet 10   pantoprazole (PROTONIX) 40 MG tablet Take 1 tablet (40 mg total) by mouth daily. For stomach 30 tablet 11   predniSONE (DELTASONE) 20 MG tablet Take 2 tablets (40 mg total) by mouth daily with breakfast for 5 days. 2 po daily for 5 days 10 tablet 0   tirzepatide (MOUNJARO) 7.5 MG/0.5ML Pen Inject 7.5 mg into the skin once a week. 6 mL 3   triamcinolone ointment (KENALOG) 0.5 % Apply 1 application. topically 2 (two) times daily. 30 g 0   vitamin B-12 (CYANOCOBALAMIN) 500 MCG tablet Take 1 tablet (500 mcg total) by mouth daily.     Current Facility-Administered Medications on File Prior to Visit  Medication Dose Route Frequency Provider Last Rate Last Admin   promethazine (PHENERGAN) injection 25 mg  25 mg Intramuscular Q6H PRN Daphine Deutscher, Mary-Margaret, FNP   25 mg at 12/28/22 1316    ROS Review of Systems  Constitutional: Negative.  Negative for diaphoresis.  HENT: Negative.    Eyes:  Negative for visual disturbance.  Respiratory:  Positive for cough (Under tx. See note of Ms. Martin from 9/13). Negative for shortness of breath.   Cardiovascular:  Negative for chest pain.  Gastrointestinal:  Negative for abdominal pain.  Musculoskeletal:  Negative for arthralgias.    Objective:  BP 130/67   Pulse 71   Temp 98 F (36.7 C)   Ht 5\' 2"  (1.575 m)   Wt 153 lb (69.4 kg)   SpO2 100%   BMI 27.98 kg/m   BP Readings from Last 3 Encounters:  08/12/23 130/67  08/09/23 95/68  08/05/23 133/73    Wt Readings from Last 3  Encounters:  08/12/23 153 lb (69.4 kg)  08/09/23 155 lb 9.6 oz (70.6 kg)  08/05/23 156 lb (70.8 kg)     Physical Exam Constitutional:      General: She is not in acute distress.    Appearance: She is well-developed.  Cardiovascular:     Rate and Rhythm: Normal rate and regular rhythm.  Pulmonary:     Breath sounds: Normal breath sounds. No rhonchi or rales.     Comments: Mild bronchitic chnages Musculoskeletal:        General: Normal range of motion.  Skin:    General: Skin is warm and dry.  Neurological:     Mental Status: She is alert and oriented to person, place, and time.     Diabetic Foot Exam - Simple   Simple Foot Form Diabetic Foot exam was performed with the following findings: Yes 08/12/2023  9:29 AM  Visual Inspection No deformities, no ulcerations, no other skin breakdown bilaterally: Yes Sensation  Testing Intact to touch and monofilament testing bilaterally: Yes Pulse Check See comments: Yes Comments Feet slightly cool. 1+ pulses only at [facility] bilaterally       Assessment & Plan:   Heartly was seen today for medical management of chronic issues.  Diagnoses and all orders for this visit:  Type 2 diabetes mellitus with stage 3a chronic kidney disease, without long-term current use of insulin (HCC) -     Bayer DCA Hb A1c Waived  Mixed hyperlipidemia -     Lipid panel  Primary hypertension -     CBC with Differential/Platelet -     CMP14+EGFR  Bronchitis -     DG Chest 2 View; Future  Long-term current use of injectable noninsulin antidiabetic medication  Other orders -     alendronate (FOSAMAX) 70 MG tablet; Take 1 tablet (70 mg total) by mouth once a week. Take with a full glass of water on an empty stomach. -     oxybutynin (DITROPAN-XL) 5 MG 24 hr tablet; Take 1 tablet (5 mg total) by mouth at bedtime.  Continue z pack for now. Monitor glucose carefully (prednisone). INB 48 hours or positive CXR consider adding second antobiotic.    I  have discontinued Lawernce Keas. Mitro's doxycycline. I have also changed her alendronate. Additionally, I am having her maintain her Accu-Chek Softclix Lancets, nitroGLYCERIN, Accu-Chek Guide, cyanocobalamin, triamcinolone ointment, diclofenac Sodium, albuterol, dapagliflozin propanediol, pantoprazole, Premarin, amLODipine, glipiZIDE, atorvastatin, Nexlizet, Accu-Chek Guide, tirzepatide, ALPRAZolam, furosemide, apixaban, clopidogrel, famotidine, benzonatate, predniSONE, chlorpheniramine-HYDROcodone, azithromycin, and oxybutynin. We will continue to administer promethazine.  Meds ordered this encounter  Medications   alendronate (FOSAMAX) 70 MG tablet    Sig: Take 1 tablet (70 mg total) by mouth once a week. Take with a full glass of water on an empty stomach.    Dispense:  12 tablet    Refill:  3   oxybutynin (DITROPAN-XL) 5 MG 24 hr tablet    Sig: Take 1 tablet (5 mg total) by mouth at bedtime.    Dispense:  90 tablet    Refill:  3     Follow-up: Return in about 3 months (around 11/11/2023), or if symptoms worsen or fail to improve.  Mechele Claude, M.D.

## 2023-08-12 NOTE — Progress Notes (Signed)
Your chest x-ray looked normal. Thanks, WS.

## 2023-08-13 LAB — CMP14+EGFR
ALT: 9 IU/L (ref 0–32)
AST: 18 IU/L (ref 0–40)
Albumin: 3.6 g/dL — ABNORMAL LOW (ref 3.8–4.8)
Alkaline Phosphatase: 97 IU/L (ref 44–121)
BUN/Creatinine Ratio: 17 (ref 12–28)
BUN: 17 mg/dL (ref 8–27)
Bilirubin Total: 0.4 mg/dL (ref 0.0–1.2)
CO2: 26 mmol/L (ref 20–29)
Calcium: 9.3 mg/dL (ref 8.7–10.3)
Chloride: 98 mmol/L (ref 96–106)
Creatinine, Ser: 1.01 mg/dL — ABNORMAL HIGH (ref 0.57–1.00)
Globulin, Total: 2.4 g/dL (ref 1.5–4.5)
Glucose: 156 mg/dL — ABNORMAL HIGH (ref 70–99)
Potassium: 4 mmol/L (ref 3.5–5.2)
Sodium: 139 mmol/L (ref 134–144)
Total Protein: 6 g/dL (ref 6.0–8.5)
eGFR: 59 mL/min/{1.73_m2} — ABNORMAL LOW (ref >59)

## 2023-08-13 LAB — LIPID PANEL
Chol/HDL Ratio: 3.5 ratio (ref 0.0–4.4)
Cholesterol, Total: 136 mg/dL (ref 100–199)
HDL: 39 mg/dL — ABNORMAL LOW (ref >39)
LDL Chol Calc (NIH): 77 mg/dL (ref 0–99)
Triglycerides: 106 mg/dL (ref 0–149)
VLDL Cholesterol Cal: 20 mg/dL (ref 5–40)

## 2023-08-13 LAB — CBC WITH DIFFERENTIAL/PLATELET
Basophils Absolute: 0 10*3/uL (ref 0.0–0.2)
Basos: 1 %
EOS (ABSOLUTE): 0.2 10*3/uL (ref 0.0–0.4)
Eos: 3 %
Hematocrit: 35.1 % (ref 34.0–46.6)
Hemoglobin: 12 g/dL (ref 11.1–15.9)
Immature Grans (Abs): 0 10*3/uL (ref 0.0–0.1)
Immature Granulocytes: 0 %
Lymphocytes Absolute: 1.8 10*3/uL (ref 0.7–3.1)
Lymphs: 33 %
MCH: 29.4 pg (ref 26.6–33.0)
MCHC: 34.2 g/dL (ref 31.5–35.7)
MCV: 86 fL (ref 79–97)
Monocytes Absolute: 0.5 10*3/uL (ref 0.1–0.9)
Monocytes: 9 %
Neutrophils Absolute: 3.1 10*3/uL (ref 1.4–7.0)
Neutrophils: 54 %
Platelets: 257 10*3/uL (ref 150–450)
RBC: 4.08 x10E6/uL (ref 3.77–5.28)
RDW: 12.3 % (ref 11.7–15.4)
WBC: 5.6 10*3/uL (ref 3.4–10.8)

## 2023-08-13 NOTE — Progress Notes (Signed)
Hello Aelita,  Your lab result is normal and/or stable.Some minor variations that are not significant are commonly marked abnormal, but do not represent any medical problem for you.  Best regards, Mechele Claude, M.D.

## 2023-08-27 ENCOUNTER — Ambulatory Visit (INDEPENDENT_AMBULATORY_CARE_PROVIDER_SITE_OTHER): Payer: Medicare PPO | Admitting: Family Medicine

## 2023-08-27 ENCOUNTER — Encounter: Payer: Self-pay | Admitting: Family Medicine

## 2023-08-27 VITALS — BP 127/67 | HR 76 | Temp 97.8°F | Ht 62.0 in | Wt 151.0 lb

## 2023-08-27 DIAGNOSIS — E1165 Type 2 diabetes mellitus with hyperglycemia: Secondary | ICD-10-CM | POA: Diagnosis not present

## 2023-08-27 DIAGNOSIS — Z7985 Long-term (current) use of injectable non-insulin antidiabetic drugs: Secondary | ICD-10-CM | POA: Diagnosis not present

## 2023-08-27 MED ORDER — TIRZEPATIDE 10 MG/0.5ML ~~LOC~~ SOAJ: 10.0000 mg | SUBCUTANEOUS | 3 refills | Status: DC

## 2023-08-27 NOTE — Progress Notes (Signed)
Subjective:  Patient ID: Yvonne [name], female    DOB: 1950/12/27  Age: 72 y.o. MRN: [id number]  CC: Diabetes   HPI Yvonne [name] presents for glucose running in the 200s. Was 177 fasting this AM.  Usually 80-100 fasting and under 120 prandial. Had to take a steroid for a few days due to asthmatic bronchitis recently. Having trouble getting glucose back to baseline since then. Denies lows.    08/27/2023    9:35 AM 08/12/2023    8:53 AM 08/12/2023    8:33 AM  Depression screen PHQ 2/9  Decreased Interest 2 2 0  Down, Depressed, Hopeless 2 2 0  PHQ - 2 Score 4 4 0  Altered sleeping 3 2   Tired, decreased energy 2 2   Change in appetite 2 3   Feeling bad or failure about yourself  3 3   Trouble concentrating 2 3   Moving slowly or fidgety/restless 2 2   Suicidal thoughts 0 0   PHQ-9 Score 18 19   Difficult doing work/chores Somewhat difficult Somewhat difficult     History Yvonne [name] has a past medical history of (HFpEF) heart failure with preserved ejection fraction (HCC), Anxiety, Aortic atherosclerosis (HCC), Arthritis, Asthma, CAD (coronary artery disease), Carotid artery disease (HCC), Chronic cough, Chronic otitis media (12/2017), Full dentures, GERD (gastroesophageal reflux disease), History of stroke (09/2017), Hyperlipidemia, Hypertension, Non-insulin dependent type 2 diabetes mellitus (HCC), Orthostatic hypotension, Overactive bladder, PSVT (paroxysmal supraventricular tachycardia) (HCC), Recurrent acute suppurative otitis media without spontaneous rupture of left tympanic membrane (02/19/2018), Stroke (HCC), and Transient cerebral ischemia.   She has a past surgical history that includes Cholecystectomy; Colonoscopy (N/A, 08/17/2015); Abdominal hysterectomy; Lacrimal duct exploration (Bilateral); Cataract extraction w/ intraocular lens implant (Right); Vitrectomy (Left, 09/14/2015); Gas insertion (Right); and Myringotomy with tube placement (Bilateral, 01/28/2018).   Her family  history includes Arthritis in her brother and mother; Arthritis-Osteo in her son; Asthma in her father; Breast cancer in her mother; CVA in her mother; Cancer in her brother; Diabetes in her father and mother; Heart disease in her father; Hepatitis C in her sister; Peripheral Artery Disease in her daughter; Stroke in her mother.She reports that she has never smoked. She has never used smokeless tobacco. She reports that she does not currently use alcohol. She reports that she does not use drugs.    ROS Review of Systems  Objective:  BP 127/67   Pulse 76   Temp 97.8 F (36.6 C)   Ht 5\' 2"  (1.575 m)   Wt 151 lb (68.5 kg)   SpO2 97%   BMI 27.62 kg/m   BP Readings from Last 3 Encounters:  08/27/23 127/67  08/12/23 130/67  08/09/23 95/68    Wt Readings from Last 3 Encounters:  08/27/23 151 lb (68.5 kg)  08/12/23 153 lb (69.4 kg)  08/09/23 155 lb 9.6 oz (70.6 kg)     Physical Exam Constitutional:      General: She is not in acute distress.    Appearance: She is well-developed.  Cardiovascular:     Rate and Rhythm: Normal rate and regular rhythm.  Pulmonary:     Breath sounds: Normal breath sounds.  Musculoskeletal:        General: Normal range of motion.  Skin:    General: Skin is warm and dry.  Neurological:     Mental Status: She is alert and oriented to person, place, and time.       Assessment & Plan:   Yvonne [name]  was seen today for diabetes.  Diagnoses and all orders for this visit:  Type 2 diabetes mellitus with hyperglycemia, without long-term current use of insulin (HCC)  Long-term current use of injectable noninsulin antidiabetic medication  Other orders -     tirzepatide (MOUNJARO) 10 MG/0.5ML Pen; Inject 10 mg into the skin once a week.       I have discontinued Yvonne [name]'s nitroGLYCERIN, triamcinolone ointment, diclofenac Sodium, tirzepatide, benzonatate, chlorpheniramine-HYDROcodone, and azithromycin. I am also having her start on  tirzepatide. Additionally, I am having her maintain her Accu-Chek Softclix Lancets, Accu-Chek Guide, cyanocobalamin, albuterol, dapagliflozin propanediol, pantoprazole, Premarin, amLODipine, glipiZIDE, atorvastatin, Nexlizet, Accu-Chek Guide, ALPRAZolam, furosemide, apixaban, clopidogrel, famotidine, alendronate, and oxybutynin. We will continue to administer promethazine.  Allergies as of 08/27/2023       Reactions   Penicillins Hives, Itching   Has patient had a PCN reaction causing immediate rash, facial/tongue/throat swelling, SOB or lightheadedness with hypotension: Yes Has patient had a PCN reaction causing severe rash involving mucus membranes or skin necrosis: Unk Has patient had a PCN reaction that required hospitalization: No Has patient had a PCN reaction occurring within the last 10 years: No If all of the above answers are "NO", then may proceed with Cephalosporin use. Has patient had a PCN reaction causing immediate rash, facial/tongue/throat swelling, SOB or lightheadedness with hypotension: Yes Has patient had a PCN reaction causing severe rash involving mucus membranes or skin necrosis: No Has patient had a PCN reaction that required hospitalization No Has patient had a PCN reaction occurring within the last 10 years: No If all of the above answers are "NO", then may proceed with Cephalosporin use. Has patient had a PCN reaction causing immediate rash, facial/tongue/throat swelling, SOB or lightheadedness with hypotension: Yes Has patient had a PCN reaction causing severe rash involving mucus membranes or skin necrosis: Unk Has patient had a PCN reaction that required hospitalization: No Has patient had a PCN reaction occurring within the last 10 years: No If all of the above answers are "NO", then may proceed with Cephalosporin use. Has patient had a PCN reaction causing immediate rash, facial/tongue/throat swelling, SOB or lightheadedness with hypotension: Yes Has patient had  a PCN reaction causing severe rash involving mucus membranes or skin necrosis: No Has patient had a PCN reaction that required hospitalization No Has patient had a PCN reaction occurring within the last 10 years: No If all of the above answers are "NO", then may proceed with Cephalosporin use. Has patient had a PCN reaction causing immediate rash, facial/tongue/throat swelling, SOB or lightheadedness with hypotension: Yes Has patient had a PCN reaction causing sev... (TRUNCATED)   Metformin And Related Diarrhea   dizziness   Gabapentin Other (See Comments)   Causes a lot of lethargy at a higher dose Causes a lot of lethargy at a higher dose Causes a lot of lethargy at a higher dose   Lisinopril Cough   HAS A CHRONIC COUGH AND DID NOT NOTICE IMPROVEMENT OF COUGH WHEN LISINOPRIL WAS STOPPED HAS A CHRONIC COUGH AND DID NOT NOTICE IMPROVEMENT OF COUGH WHEN LISINOPRIL WAS STOPPED HAS A CHRONIC COUGH AND DID NOT NOTICE IMPROVEMENT OF COUGH WHEN LISINOPRIL WAS STOPPED   Soap Rash   DIAL soap        Medication List        Accurate as of August 27, 2023 10:04 AM. If you have any questions, ask your nurse or doctor.          STOP  taking these medications    azithromycin 250 MG tablet Commonly known as: Zithromax Z-Pak Stopped by: Yvonne [name]   benzonatate 100 MG capsule Commonly known as: TESSALON Stopped by: Yvonne [name]   chlorpheniramine-HYDROcodone 10-8 MG/5ML Commonly known as: TUSSIONEX Stopped by: Yvonne [name]   diclofenac Sodium 1 % Gel Commonly known as: Voltaren Stopped by: Yvonne [name]   nitroGLYCERIN 0.4 MG SL tablet Commonly known as: NITROSTAT Stopped by: Yvonne [name]   tirzepatide 7.5 MG/0.5ML Pen Commonly known as: MOUNJARO Replaced by: tirzepatide 10 MG/0.5ML Pen Stopped by: Yvonne [name]   triamcinolone ointment 0.5 % Commonly known as: KENALOG Stopped by: Yvonne [name]       TAKE these medications    Accu-Chek Guide test  strip Generic drug: glucose blood TEST BLOOD SUGAR 4 TIMES DAILY DX E11.69   Accu-Chek Guide w/Device Kit Test Bs QID Dx E11.69   Accu-Chek Softclix Lancets lancets Test BS up to 4 times daily Dx E11.69   albuterol 108 (90 Base) MCG/ACT inhaler Commonly known as: VENTOLIN HFA TAKE 2 PUFFS BY MOUTH EVERY 6 HOURS AS NEEDED FOR WHEEZE OR SHORTNESS OF BREATH   alendronate 70 MG tablet Commonly known as: FOSAMAX Take 1 tablet (70 mg total) by mouth once a week. Take with a full glass of water on an empty stomach.   ALPRAZolam 1 MG tablet Commonly known as: XANAX ! Each morning, one each evening, and 1/2 each afternoon   amLODipine 10 MG tablet Commonly known as: NORVASC TAKE 1 TABLET BY MOUTH EVERY DAY   apixaban 5 MG Tabs tablet Commonly known as: Eliquis Take 1 tablet (5 mg total) by mouth 2 (two) times daily. For TIA   atorvastatin 80 MG tablet Commonly known as: LIPITOR Take 1 tablet (80 mg total) by mouth daily.   clopidogrel 75 MG tablet Commonly known as: PLAVIX TAKE 1 TABLET BY MOUTH EVERY DAY WITH BREAKFAST   cyanocobalamin 500 MCG tablet Commonly known as: VITAMIN B12 Take 1 tablet (500 mcg total) by mouth daily.   dapagliflozin propanediol 10 MG Tabs tablet Commonly known as: FARXIGA Take 1 tablet (10 mg total) by mouth daily.   famotidine 20 MG tablet Commonly known as: PEPCID TAKE 1 TABLET BY MOUTH EVERYDAY AT BEDTIME   furosemide 20 MG tablet Commonly known as: LASIX Take 1 tablet (20 mg total) by mouth daily.   glipiZIDE 5 MG tablet Commonly known as: GLUCOTROL Take 1 tablet (5 mg total) by mouth daily before breakfast.   Nexlizet 180-10 MG Tabs Generic drug: Bempedoic Acid-Ezetimibe Take 1 tablet by mouth daily. Take 180 mg by mouth daily.   oxybutynin 5 MG 24 hr tablet Commonly known as: DITROPAN-XL Take 1 tablet (5 mg total) by mouth at bedtime.   pantoprazole 40 MG tablet Commonly known as: PROTONIX Take 1 tablet (40 mg total) by mouth  daily. For stomach   Premarin vaginal cream Generic drug: conjugated estrogens Use 0.5 gm in vagina nightly for 1 week then 2 x weekly   tirzepatide 10 MG/0.5ML Pen Commonly known as: MOUNJARO Inject 10 mg into the skin once a week. Replaces: tirzepatide 7.5 MG/0.5ML Pen Started by: Yvonne [name]       Increase glipizide to 10 mg (2X5 mg) daily for 1 week.Then go back to 5 mg. If possible, based on fasting glucose try to taper off completely.   Follow-up: Return in about 10 weeks (around 11/05/2023) for diabetes.  Mechele Claude, M.D.

## 2023-08-29 ENCOUNTER — Ambulatory Visit (HOSPITAL_COMMUNITY)
Admission: RE | Admit: 2023-08-29 | Discharge: 2023-08-29 | Disposition: A | Discharge status: Home / Self Care | Payer: Medicare PPO | Source: Ambulatory Visit | Attending: Family Medicine | Admitting: Family Medicine

## 2023-08-29 DIAGNOSIS — I672 Cerebral atherosclerosis: Secondary | ICD-10-CM | POA: Diagnosis not present

## 2023-08-29 DIAGNOSIS — Z8673 Personal history of transient ischemic attack (TIA), and cerebral infarction without residual deficits: Secondary | ICD-10-CM | POA: Insufficient documentation

## 2023-08-29 DIAGNOSIS — I6381 Other cerebral infarction due to occlusion or stenosis of small artery: Secondary | ICD-10-CM | POA: Insufficient documentation

## 2023-08-29 DIAGNOSIS — I6623 Occlusion and stenosis of bilateral posterior cerebral arteries: Secondary | ICD-10-CM | POA: Diagnosis not present

## 2023-08-29 DIAGNOSIS — I6523 Occlusion and stenosis of bilateral carotid arteries: Secondary | ICD-10-CM | POA: Diagnosis not present

## 2023-09-11 ENCOUNTER — Ambulatory Visit: Payer: Medicare PPO | Admitting: Neurology

## 2023-09-11 NOTE — Telephone Encounter (Signed)
Pt say that she tried to use website on letter about recertification and called but na. Pt will bring letter to office to fax to [facility] so she can look at it.

## 2023-09-19 ENCOUNTER — Telehealth: Payer: Self-pay | Admitting: Family Medicine

## 2023-09-19 DIAGNOSIS — I6523 Occlusion and stenosis of bilateral carotid arteries: Secondary | ICD-10-CM

## 2023-09-23 NOTE — Telephone Encounter (Signed)
-----   Message from Elige Radon Dettinger sent at 09/20/2023  9:01 AM EDT ----- Patient's MRA shows stenosis and some of the arterial blood flow inside the brain.  Please place urgent referral to vascular for the patient and let her know of the results.  Diagnosis PCA stenosis, bilateral

## 2023-09-25 ENCOUNTER — Telehealth: Payer: Self-pay | Admitting: Family Medicine

## 2023-09-25 NOTE — Telephone Encounter (Signed)
Pt called stating that her referral was denied because they dont treat for Intracranial Disease. Pt is very upset because her MRI was done on 10/3 and it was supposed to be an urgent request to see a vascular specialist and she still hasn't seen anyone. Please advise and call patient ASAP.

## 2023-09-26 NOTE — Telephone Encounter (Signed)
Vascular Office is stating they do not treat for intracranial disease but per the Referral DX it states bilateral carotid artery stenosis. Please advise.

## 2023-10-01 ENCOUNTER — Ambulatory Visit (INDEPENDENT_AMBULATORY_CARE_PROVIDER_SITE_OTHER): Payer: Medicare PPO

## 2023-10-01 ENCOUNTER — Other Ambulatory Visit: Payer: Self-pay | Admitting: Family Medicine

## 2023-10-01 DIAGNOSIS — E1122 Type 2 diabetes mellitus with diabetic chronic kidney disease: Secondary | ICD-10-CM | POA: Diagnosis not present

## 2023-10-01 DIAGNOSIS — N1831 Chronic kidney disease, stage 3a: Secondary | ICD-10-CM

## 2023-10-01 DIAGNOSIS — I669 Occlusion and stenosis of unspecified cerebral artery: Secondary | ICD-10-CM

## 2023-10-01 LAB — HM DIABETES EYE EXAM

## 2023-10-01 NOTE — Telephone Encounter (Signed)
I rewrote the referral to go to neurosurgery

## 2023-10-01 NOTE — Progress Notes (Signed)
Yvonne [name] arrived 10/01/2023 and has given verbal consent to obtain images and complete their overdue diabetic retinal screening.  The images have been sent to an ophthalmologist or optometrist for review and interpretation.  Results will be sent back to Mechele Claude, MD for review.  Patient has been informed they will be contacted when we receive the results via telephone or MyChart

## 2023-10-02 ENCOUNTER — Telehealth: Payer: Self-pay | Admitting: Pharmacist

## 2023-10-02 DIAGNOSIS — N1831 Chronic kidney disease, stage 3a: Secondary | ICD-10-CM

## 2023-10-02 MED ORDER — DAPAGLIFLOZIN PROPANEDIOL 10 MG PO TABS: 10.0000 mg | ORAL_TABLET | Freq: Every day | ORAL | 5 refills | Status: DC

## 2023-10-02 NOTE — Telephone Encounter (Signed)
   Patient [place] in the [place] patient [place] for [place].  Updated RX escribed to [facility] mail order (pharmacy for [place] patient assistance).  Patient is stable on current regimen.  CPhT re-enrolled patient for 2025.   Kieth Brightly, [name], BCACP, CPP Clinical Pharmacist, [facility]

## 2023-10-04 ENCOUNTER — Other Ambulatory Visit: Payer: Self-pay | Admitting: Nurse Practitioner

## 2023-10-04 ENCOUNTER — Other Ambulatory Visit: Payer: Self-pay | Admitting: Family Medicine

## 2023-10-04 NOTE — Telephone Encounter (Unsigned)
Copied from CRM [id number]. Topic: Referral - Status >> Oct 04, 2023 10:50 AM Larwance Sachs wrote: Reason for CRM: Patent is calling to check status of vascular vein doctor referral   Patient call back at [phone]

## 2023-10-09 ENCOUNTER — Other Ambulatory Visit: Payer: Self-pay | Admitting: Family Medicine

## 2023-10-09 DIAGNOSIS — Z6829 Body mass index (BMI) 29.0-29.9, adult: Secondary | ICD-10-CM | POA: Diagnosis not present

## 2023-10-09 DIAGNOSIS — I669 Occlusion and stenosis of unspecified cerebral artery: Secondary | ICD-10-CM | POA: Diagnosis not present

## 2023-10-10 ENCOUNTER — Other Ambulatory Visit: Payer: Self-pay | Admitting: Neurosurgery

## 2023-10-10 DIAGNOSIS — I669 Occlusion and stenosis of unspecified cerebral artery: Secondary | ICD-10-CM

## 2023-10-10 MED ORDER — CLOPIDOGREL BISULFATE 75 MG PO TABS: 75.0000 mg | ORAL_TABLET | Freq: Every day | ORAL | 3 refills | Status: DC

## 2023-10-10 MED ORDER — FAMOTIDINE 20 MG PO TABS: 20.0000 mg | ORAL_TABLET | Freq: Every day | ORAL | 3 refills | Status: DC

## 2023-10-22 ENCOUNTER — Other Ambulatory Visit: Payer: Self-pay | Admitting: Neurosurgery

## 2023-10-22 ENCOUNTER — Ambulatory Visit (HOSPITAL_COMMUNITY)
Admission: RE | Admit: 2023-10-22 | Discharge: 2023-10-22 | Disposition: A | Discharge status: Home / Self Care | Payer: Medicare PPO | Source: Ambulatory Visit | Attending: Neurosurgery | Admitting: Neurosurgery

## 2023-10-22 ENCOUNTER — Other Ambulatory Visit: Payer: Self-pay

## 2023-10-22 DIAGNOSIS — E785 Hyperlipidemia, unspecified: Secondary | ICD-10-CM | POA: Insufficient documentation

## 2023-10-22 DIAGNOSIS — I700 Atherosclerosis of aorta: Secondary | ICD-10-CM | POA: Diagnosis not present

## 2023-10-22 DIAGNOSIS — I110 Hypertensive heart disease with heart failure: Secondary | ICD-10-CM | POA: Insufficient documentation

## 2023-10-22 DIAGNOSIS — Z7985 Long-term (current) use of injectable non-insulin antidiabetic drugs: Secondary | ICD-10-CM | POA: Diagnosis not present

## 2023-10-22 DIAGNOSIS — Z7984 Long term (current) use of oral hypoglycemic drugs: Secondary | ICD-10-CM | POA: Diagnosis not present

## 2023-10-22 DIAGNOSIS — I669 Occlusion and stenosis of unspecified cerebral artery: Secondary | ICD-10-CM

## 2023-10-22 DIAGNOSIS — I5032 Chronic diastolic (congestive) heart failure: Secondary | ICD-10-CM | POA: Diagnosis not present

## 2023-10-22 DIAGNOSIS — R930 Abnormal findings on diagnostic imaging of skull and head, not elsewhere classified: Secondary | ICD-10-CM | POA: Diagnosis not present

## 2023-10-22 DIAGNOSIS — E119 Type 2 diabetes mellitus without complications: Secondary | ICD-10-CM | POA: Insufficient documentation

## 2023-10-22 DIAGNOSIS — G459 Transient cerebral ischemic attack, unspecified: Secondary | ICD-10-CM | POA: Diagnosis not present

## 2023-10-22 HISTORY — PX: IR ANGIO VERTEBRAL SEL VERTEBRAL UNI L MOD SED: IMG5367

## 2023-10-22 HISTORY — PX: IR ANGIO INTRA EXTRACRAN SEL INTERNAL CAROTID BILAT MOD SED: IMG5363

## 2023-10-22 HISTORY — PX: IR ANGIO VERTEBRAL SEL VERTEBRAL BILAT MOD SED: IMG5369

## 2023-10-22 LAB — CBC WITH DIFFERENTIAL/PLATELET
Abs Immature Granulocytes: 0 10*3/uL (ref 0.00–0.07)
Basophils Absolute: 0 10*3/uL (ref 0.0–0.1)
Basophils Relative: 1 %
Eosinophils Absolute: 0.1 10*3/uL (ref 0.0–0.5)
Eosinophils Relative: 2 %
HCT: 38.1 % (ref 36.0–46.0)
Hemoglobin: 12.9 g/dL (ref 12.0–15.0)
Immature Granulocytes: 0 %
Lymphocytes Relative: 44 %
Lymphs Abs: 2 10*3/uL (ref 0.7–4.0)
MCH: 29.9 pg (ref 26.0–34.0)
MCHC: 33.9 g/dL (ref 30.0–36.0)
MCV: 88.4 fL (ref 80.0–100.0)
Monocytes Absolute: 0.4 10*3/uL (ref 0.1–1.0)
Monocytes Relative: 8 %
Neutro Abs: 2.1 10*3/uL (ref 1.7–7.7)
Neutrophils Relative %: 45 %
Platelets: 226 10*3/uL (ref 150–400)
RBC: 4.31 MIL/uL (ref 3.87–5.11)
RDW: 13.2 % (ref 11.5–15.5)
WBC: 4.6 10*3/uL (ref 4.0–10.5)
nRBC: 0 % (ref 0.0–0.2)

## 2023-10-22 LAB — PROTIME-INR
INR: 1 (ref 0.8–1.2)
Prothrombin Time: 13.1 s (ref 11.4–15.2)

## 2023-10-22 LAB — BASIC METABOLIC PANEL
Anion gap: 7 (ref 5–15)
BUN: 17 mg/dL (ref 8–23)
CO2: 26 mmol/L (ref 22–32)
Calcium: 9.3 mg/dL (ref 8.9–10.3)
Chloride: 106 mmol/L (ref 98–111)
Creatinine, Ser: 0.85 mg/dL (ref 0.44–1.00)
GFR, Estimated: >60 mL/min (ref >60)
Glucose, Bld: 148 mg/dL — ABNORMAL HIGH (ref 70–99)
Potassium: 3.4 mmol/L — ABNORMAL LOW (ref 3.5–5.1)
Sodium: 139 mmol/L (ref 135–145)

## 2023-10-22 LAB — URINALYSIS, ROUTINE W REFLEX MICROSCOPIC
Bilirubin Urine: NEGATIVE
Glucose, UA: NEGATIVE mg/dL
Hgb urine dipstick: NEGATIVE
Ketones, ur: NEGATIVE mg/dL
Nitrite: NEGATIVE
Protein, ur: NEGATIVE mg/dL
Specific Gravity, Urine: 1.015 (ref 1.005–1.030)
WBC, UA: >50 WBC/hpf (ref 0–5)
pH: 5 (ref 5.0–8.0)

## 2023-10-22 LAB — GLUCOSE, CAPILLARY
Glucose-Capillary: 129 mg/dL — ABNORMAL HIGH (ref 70–99)
Glucose-Capillary: 117 mg/dL — ABNORMAL HIGH (ref 70–99)

## 2023-10-22 LAB — APTT: aPTT: 30 s (ref 24–36)

## 2023-10-22 MED ORDER — CHLORHEXIDINE GLUCONATE CLOTH 2 % EX PADS: 6.0000 | MEDICATED_PAD | Freq: Once | CUTANEOUS | Status: DC

## 2023-10-22 MED ORDER — FENTANYL CITRATE (PF) 100 MCG/2ML IJ SOLN
INTRAMUSCULAR | Status: AC
  Filled 2023-10-22: qty 2

## 2023-10-22 MED ORDER — LIDOCAINE HCL 1 % IJ SOLN
20.0000 mL | Freq: Once | INTRAMUSCULAR | Status: AC
  Administered 2023-10-22: 10 mL

## 2023-10-22 MED ORDER — HYDROCODONE-ACETAMINOPHEN 5-325 MG PO TABS
1.0000 | ORAL_TABLET | ORAL | Status: DC | PRN
  Administered 2023-10-22 (×2): 2 via ORAL
  Filled 2023-10-22 (×2): qty 2

## 2023-10-22 MED ORDER — HEPARIN SODIUM (PORCINE) 1000 UNIT/ML IJ SOLN
INTRAMUSCULAR | Status: AC | PRN
  Administered 2023-10-22: 2000 [IU] via INTRAVENOUS

## 2023-10-22 MED ORDER — IOHEXOL 300 MG/ML  SOLN
100.0000 mL | Freq: Once | INTRAMUSCULAR | Status: AC | PRN
  Administered 2023-10-22: 60 mL via INTRA_ARTERIAL

## 2023-10-22 MED ORDER — MIDAZOLAM HCL 2 MG/2ML IJ SOLN
INTRAMUSCULAR | Status: AC
Start: 2023-10-22 — End: ?
  Filled 2023-10-22: qty 2

## 2023-10-22 MED ORDER — LIDOCAINE HCL 1 % IJ SOLN
INTRAMUSCULAR | Status: AC
  Filled 2023-10-22: qty 20

## 2023-10-22 MED ORDER — MIDAZOLAM HCL 2 MG/2ML IJ SOLN
INTRAMUSCULAR | Status: AC | PRN
  Administered 2023-10-22: .5 mg via INTRAVENOUS
  Administered 2023-10-22: 1 mg via INTRAVENOUS

## 2023-10-22 MED ORDER — FENTANYL CITRATE (PF) 100 MCG/2ML IJ SOLN
INTRAMUSCULAR | Status: AC | PRN
  Administered 2023-10-22 (×2): 25 ug via INTRAVENOUS

## 2023-10-22 MED ORDER — APIXABAN 5 MG PO TABS: 5.0000 mg | ORAL_TABLET | Freq: Two times a day (BID) | ORAL | Status: DC

## 2023-10-22 MED ORDER — CLOPIDOGREL BISULFATE 75 MG PO TABS: 75.0000 mg | ORAL_TABLET | Freq: Every day | ORAL | Status: DC

## 2023-10-22 MED ORDER — VANCOMYCIN HCL IN DEXTROSE 1-5 GM/200ML-% IV SOLN: 1000.0000 mg | INTRAVENOUS | Status: DC

## 2023-10-22 MED ORDER — HEPARIN SODIUM (PORCINE) 1000 UNIT/ML IJ SOLN
INTRAMUSCULAR | Status: AC
  Filled 2023-10-22: qty 10

## 2023-10-22 NOTE — H&P (Addendum)
Chief Complaint   TIA  History of Present Illness  Yvonne [name] is a 72 y.o. female referred to me for diagnostic cerebral angiogram.  Briefly, the patient has a history of hypertension, diabetes, dyslipidemia, aortic atherosclerosis, and heart failure.  She has had multiple previous strokes and transient ischemic attacks.  Most recently, she had right sided arm and leg paresthesias.  Workup included MRI and MRA which revealed the likelihood of intracranial atherosclerotic disease.  She was therefore referred for diagnostic cerebral angiogram for possible angioplasty as she appears to have failed aggressive medical management.  Past Medical History   Past Medical History:  Diagnosis Date   (HFpEF) heart failure with preserved ejection fraction (HCC)    Anxiety    Aortic atherosclerosis (HCC)    Arthritis    left hand   Asthma    daily and prn inhalers   CAD (coronary artery disease)    Carotid artery disease (HCC)    Chronic cough    Chronic otitis media 12/2017   Full dentures    GERD (gastroesophageal reflux disease)    History of stroke 09/2017   weakness right hand, numbness right side face   Hyperlipidemia    Hypertension    states under control with meds., has been on med. x a long time, per pt.   Non-insulin dependent type 2 diabetes mellitus (HCC)    Orthostatic hypotension    Overactive bladder    PSVT (paroxysmal supraventricular tachycardia) (HCC)    Recurrent acute suppurative otitis media without spontaneous rupture of left tympanic membrane 02/19/2018   Stroke (HCC)    Transient cerebral ischemia     Past Surgical History   Past Surgical History:  Procedure Laterality Date   ABDOMINAL HYSTERECTOMY     complete   CATARACT EXTRACTION W/ INTRAOCULAR LENS IMPLANT Right    CHOLECYSTECTOMY     COLONOSCOPY N/A 08/17/2015   Procedure: COLONOSCOPY;  Surgeon: Malissa Hippo, MD;  [facility]: [facility];  Service: Endoscopy;  Laterality: N/A;  200 - moved to 7:30  - Ann notified pt   GAS INSERTION Right    x 2 - eye   LACRIMAL DUCT EXPLORATION Bilateral    removal of tear ducts   MYRINGOTOMY WITH TUBE PLACEMENT Bilateral 01/28/2018   Procedure: BILATERAL MYRINGOTOMY WITH TUBE PLACEMENT;  Surgeon: Newman Pies, MD;  Location: [facility];  Service: ENT;  Laterality: Bilateral;   VITRECTOMY Left 09/14/2015    Social History   Social History   Tobacco Use   Smoking status: Never   Smokeless tobacco: Never  Vaping Use   Vaping status: Never Used  Substance Use Topics   Alcohol use: Not Currently   Drug use: No    Medications   Prior to Admission medications   Medication Sig Start Date End Date Taking? Authorizing Provider  albuterol (VENTOLIN HFA) 108 (90 Base) MCG/ACT inhaler TAKE 2 PUFFS BY MOUTH EVERY 6 HOURS AS NEEDED FOR WHEEZE OR SHORTNESS OF BREATH 08/28/22  Yes [name], Broadus John, MD  alendronate (FOSAMAX) 70 MG tablet Take 1 tablet (70 mg total) by mouth once a week. Take with a full glass of water on an empty stomach. 08/12/23  Yes Mechele Claude, MD  ALPRAZolam Prudy Feeler) 1 MG tablet ! Each morning, one each evening, and 1/2 each afternoon 05/13/23  Yes [name], Broadus John, MD  amLODipine (NORVASC) 10 MG tablet TAKE 1 TABLET BY MOUTH EVERY DAY 02/06/23  Yes Mechele Claude, MD  atorvastatin (LIPITOR) 80 MG tablet  Take 1 tablet (80 mg total) by mouth daily. 02/12/23  Yes [name], Broadus John, MD  Bempedoic Acid-Ezetimibe (NEXLIZET) 180-10 MG TABS Take 1 tablet by mouth daily. Take 180 mg by mouth daily. 02/12/23  Yes Mechele Claude, MD  clopidogrel (PLAVIX) 75 MG tablet Take 1 tablet (75 mg total) by mouth daily. 10/10/23  Yes Mechele Claude, MD  glipiZIDE (GLUCOTROL) 5 MG tablet Take 1 tablet (5 mg total) by mouth daily before breakfast. 02/06/23  Yes [name], Broadus John, MD  pantoprazole (PROTONIX) 40 MG tablet Take 1 tablet (40 mg total) by mouth daily. For stomach 11/07/22  Yes [name], Broadus John, MD  tirzepatide [facility]) 10 MG/0.5ML Pen Inject 10 mg  into the skin once a week. 08/27/23  Yes [name], Broadus John, MD  vitamin B-12 (CYANOCOBALAMIN) 500 MCG tablet Take 1 tablet (500 mcg total) by mouth daily. 01/19/22  Yes TatOnalee Hua, MD  ACCU-CHEK GUIDE test strip TEST BLOOD SUGAR 4 TIMES DAILY DX E11.69 03/05/23   Mechele Claude, MD  Accu-Chek Softclix Lancets lancets Test BS up to 4 times daily Dx E11.69 04/04/20   Mechele Claude, MD  apixaban (ELIQUIS) 5 MG TABS tablet TAKE 1 TABLET (5 MG TOTAL) BY MOUTH 2 (TWO) TIMES DAILY. FOR TIA 10/04/23   Mechele Claude, MD  Blood Glucose Monitoring Suppl (ACCU-CHEK GUIDE) w/Device KIT Test Bs QID Dx E11.69 02/28/21   Mechele Claude, MD  conjugated estrogens (PREMARIN) vaginal cream Use 0.5 gm in vagina nightly for 1 week then 2 x weekly 11/23/22   Adline Potter, NP  dapagliflozin propanediol (FARXIGA) 10 MG TABS tablet Take 1 tablet (10 mg total) by mouth daily. 10/02/23   Mechele Claude, MD  famotidine (PEPCID) 20 MG tablet Take 1 tablet (20 mg total) by mouth at bedtime. 10/10/23   Mechele Claude, MD  furosemide (LASIX) 20 MG tablet Take 1 tablet (20 mg total) by mouth daily. 05/13/23   Mechele Claude, MD  oxybutynin (DITROPAN-XL) 5 MG 24 hr tablet Take 1 tablet (5 mg total) by mouth at bedtime. 08/12/23   Mechele Claude, MD    Allergies   Allergies  Allergen Reactions   Penicillins Hives and Itching    Has patient had a PCN reaction causing immediate rash, facial/tongue/throat swelling, SOB or lightheadedness with hypotension: Yes Has patient had a PCN reaction causing severe rash involving mucus membranes or skin necrosis: Unk Has patient had a PCN reaction that required hospitalization: No Has patient had a PCN reaction occurring within the last 10 years: No If all of the above answers are "NO", then may proceed with Cephalosporin use.  Has patient had a PCN reaction causing immediate rash, facial/tongue/throat swelling, SOB or lightheadedness with hypotension: Yes Has patient had a PCN reaction causing  severe rash involving mucus membranes or skin necrosis: No Has patient had a PCN reaction that required hospitalization No Has patient had a PCN reaction occurring within the last 10 years: No If all of the above answers are "NO", then may proceed with Cephalosporin use. Has patient had a PCN reaction causing immediate rash, facial/tongue/throat swelling, SOB or lightheadedness with hypotension: Yes Has patient had a PCN reaction causing severe rash involving mucus membranes or skin necrosis: Unk Has patient had a PCN reaction that required hospitalization: No Has patient had a PCN reaction occurring within the last 10 years: No If all of the above answers are "NO", then may proceed with Cephalosporin use. Has patient had a PCN reaction causing immediate rash, facial/tongue/throat swelling, SOB or lightheadedness with hypotension:  Yes Has patient had a PCN reaction causing severe rash involving mucus membranes or skin necrosis: No Has patient had a PCN reaction that required hospitalization No Has patient had a PCN reaction occurring within the last 10 years: No If all of the above answers are "NO", then may proceed with Cephalosporin use. Has patient had a PCN reaction causing immediate rash, facial/tongue/throat swelling, SOB or lightheadedness with hypotension: Yes Has patient had a PCN reaction causing sev... (TRUNCATED)   Metformin And Related Diarrhea    dizziness   Gabapentin Other (See Comments)    Causes a lot of lethargy at a higher dose Causes a lot of lethargy at a higher dose Causes a lot of lethargy at a higher dose   Lisinopril Cough    HAS A CHRONIC COUGH AND DID NOT NOTICE IMPROVEMENT OF COUGH WHEN LISINOPRIL WAS STOPPED HAS A CHRONIC COUGH AND DID NOT NOTICE IMPROVEMENT OF COUGH WHEN LISINOPRIL WAS STOPPED HAS A CHRONIC COUGH AND DID NOT NOTICE IMPROVEMENT OF COUGH WHEN LISINOPRIL WAS STOPPED   Soap Rash    DIAL soap    Review of Systems  ROS  Neurologic Exam   Awake, alert, oriented Memory and concentration grossly intact Speech fluent, appropriate CN grossly intact Motor exam: Upper Extremities Deltoid Bicep Tricep Grip  Right 5/5 5/5 5/5 5/5  Left 5/5 5/5 5/5 5/5   Lower Extremities IP Quad PF DF EHL  Right 5/5 5/5 5/5 5/5 5/5  Left 5/5 5/5 5/5 5/5 5/5   Sensation grossly intact to LT   Impression  - 72 y.o. female with continued stroke/TIA despite appropriate management of vascular risk factors.  Plan  -We will plan on proceeding with diagnostic cerebral angiogram  I have reviewed the indications for the procedure as well as the details of the procedure and the expected postoperative course and recovery. We have also reviewed in detail the risks, benefits, and alternatives to the procedure. All questions were answered and JANAT SAUNDERSON provided informed consent to proceed.  Lisbeth Renshaw, MD [facility]

## 2023-10-23 ENCOUNTER — Other Ambulatory Visit: Payer: Self-pay | Admitting: Neurosurgery

## 2023-10-23 ENCOUNTER — Encounter (HOSPITAL_COMMUNITY): Payer: Self-pay

## 2023-10-23 DIAGNOSIS — I669 Occlusion and stenosis of unspecified cerebral artery: Secondary | ICD-10-CM

## 2023-11-04 ENCOUNTER — Ambulatory Visit (INDEPENDENT_AMBULATORY_CARE_PROVIDER_SITE_OTHER): Payer: Medicare PPO | Admitting: Nurse Practitioner

## 2023-11-04 VITALS — BP 135/69 | HR 75 | Temp 97.6°F | Ht 62.0 in | Wt 161.0 lb

## 2023-11-04 DIAGNOSIS — R0981 Nasal congestion: Secondary | ICD-10-CM | POA: Diagnosis not present

## 2023-11-04 DIAGNOSIS — R051 Acute cough: Secondary | ICD-10-CM | POA: Insufficient documentation

## 2023-11-04 DIAGNOSIS — R058 Other specified cough: Secondary | ICD-10-CM | POA: Diagnosis not present

## 2023-11-04 DIAGNOSIS — J029 Acute pharyngitis, unspecified: Secondary | ICD-10-CM | POA: Diagnosis not present

## 2023-11-04 LAB — RAPID STREP SCREEN (MED CTR MEBANE ONLY): Strep Gp A Ag, IA W/Reflex: NEGATIVE

## 2023-11-04 LAB — CULTURE, GROUP A STREP

## 2023-11-04 LAB — VERITOR FLU A/B WAIVED
Influenza A: NEGATIVE
Influenza B: NEGATIVE

## 2023-11-04 MED ORDER — AZITHROMYCIN 250 MG PO TABS: ORAL_TABLET | ORAL | 0 refills | Status: DC

## 2023-11-04 NOTE — Progress Notes (Signed)
Acute Office Visit  Subjective:     Patient ID: Yvonne [name], female    DOB: [birth year], 72 y.o.   MRN: [id number]  Chief Complaint  Patient presents with   Sore Throat    Symptoms since last friday   Cough   Nasal Congestion    HPI SUBJECTIVE:  Yvonne [name] is a 72 y.o. female present 11/04/2023 who complains of congestion, sore throat, productive cough, and fever for 4 days. She denies a history of anorexia, chest pain, and dizziness and denies a history of asthma.  Reports "not take any OTC, had 101 fever yesterday I din't take anything because I am not suppose to take anything unless the doctor told me". Reports that she took an at home COVID test on Friday and was negative " everyone at the house is sick" POC flu A&B negative, Strep negative COVID pending   Active Ambulatory Problems    Diagnosis Date Noted   Overactive bladder    Hypertension    GAD (generalized anxiety disorder)    Precordial pain 07/06/2015   Obesity 07/20/2015   Acid reflux disease 08/17/2014   Osteopenia 07/20/2015   HLD (hyperlipidemia) 11/04/2015   Chronic cough 05/17/2016   Macular hole of right eye 09/14/2015   Pain of both hip joints 06/18/2016   Pain syndrome, chronic 06/18/2016   (HFpEF) heart failure with preserved ejection fraction (HCC) 03/21/2017   Chronic migraine without aura without status migrainosus, not intractable 02/19/2018   Benzodiazepine dependence, continuous (HCC) 10/22/2018   Hypokalemia 12/11/2018   Chronic bilateral low back pain with bilateral sciatica 02/27/2017   Spondylosis of lumbar region without myelopathy or radiculopathy 08/15/2016   Diabetic lipidosis (HCC) 09/09/2019   Palpitations 01/20/2021   Aortic atherosclerosis (HCC) 01/20/2021   Orthostatic hypotension 01/20/2021   Restless legs 02/27/2021   Insomnia 02/27/2021   Polyneuropathy due to type 2 diabetes mellitus (HCC) 02/27/2021   Carpal tunnel syndrome 08/23/2020   Syncope and collapse 01/16/2022    Type 2 diabetes mellitus with stage 3a chronic kidney disease, without long-term current use of insulin (HCC) 01/17/2022   Sensory disturbance 01/17/2022   TIA (transient ischemic attack) 01/18/2022   History of stroke 01/24/2022   Coronary artery disease involving native coronary artery of native heart without angina pectoris 01/24/2022   Depression, major, single episode, severe (HCC) 04/10/2022   Peripheral edema 04/10/2022   S/P hysterectomy with oophorectomy 11/23/2022   SUI (stress urinary incontinence, female) 11/23/2022   Dyspareunia in female 11/23/2022   Long-term current use of injectable noninsulin antidiabetic medication 08/12/2023   Cough productive of purulent sputum 11/04/2023   Nasal congestion 11/04/2023   Sore throat 11/04/2023   Acute cough 11/04/2023   Resolved Ambulatory Problems    Diagnosis Date Noted   Metabolic syndrome 07/20/2015   COPD exacerbation (HCC) 04/06/2016   Sinus drainage 05/17/2016   Weakness 09/28/2016   AKI (acute kidney injury) (HCC) 09/28/2016   Lethargy    Hemiparesis affecting dominant side as late effect of cerebrovascular accident (CVA) (HCC) 10/15/2016   Stroke (cerebrum) (HCC) 03/21/2017   Transient cerebral ischemia    Prediabetes    Epidermoid cyst of finger 12/04/2017   Cyst of ear canal 12/04/2017   Recurrent acute suppurative otitis media without spontaneous rupture of left tympanic membrane 02/19/2018   Right sided numbness 12/11/2018   Left-sided weakness 06/10/2019   Ear pain, right 06/13/2021   Past Medical History:  Diagnosis Date   Anxiety    Arthritis  Asthma    CAD (coronary artery disease)    Carotid artery disease (HCC)    Chronic otitis media 12/2017   Full dentures    GERD (gastroesophageal reflux disease)    Hyperlipidemia    Non-insulin dependent type 2 diabetes mellitus (HCC)    PSVT (paroxysmal supraventricular tachycardia) (HCC)    Stroke (HCC)    Review of Systems  Constitutional:  Positive  for fever. Negative for malaise/fatigue.       Yesterday 101  HENT:  Positive for congestion and sore throat. Negative for ear pain.   Eyes:  Negative for pain.  Respiratory:  Positive for cough and sputum production. Negative for wheezing.        Yellow/clear  Cardiovascular:  Negative for chest pain and leg swelling.  Gastrointestinal:  Negative for constipation, diarrhea, nausea and vomiting.  Musculoskeletal:  Negative for joint pain and myalgias.  Skin:  Negative for itching and rash.  Neurological:  Negative for dizziness and headaches.  Endo/Heme/Allergies:  Negative for environmental allergies and polydipsia. Does not bruise/bleed easily.   Negative unless indicated in HPI    Objective:    BP 135/69   Pulse 75   Temp 97.6 F (36.4 C) (Temporal)   Ht 5\' 2"  (1.575 m)   Wt 161 lb (73 kg)   SpO2 96%   BMI 29.45 kg/m  BP Readings from Last 3 Encounters:  11/04/23 135/69  10/22/23 (!) 155/78  08/27/23 127/67   Wt Readings from Last 3 Encounters:  11/04/23 161 lb (73 kg)  10/22/23 158 lb (71.7 kg)  08/27/23 151 lb (68.5 kg)      Physical Exam Vitals and nursing note reviewed.  Constitutional:      Appearance: She is ill-appearing.  HENT:     Head: Normocephalic and atraumatic.     Right Ear: Tympanic membrane, ear canal and external ear normal. There is no impacted cerumen.     Left Ear: Tympanic membrane, ear canal and external ear normal. There is no impacted cerumen.     Nose: Congestion and rhinorrhea present. No nasal tenderness.     Right Sinus: Maxillary sinus tenderness present. No frontal sinus tenderness.     Left Sinus: Maxillary sinus tenderness present. No frontal sinus tenderness.     Mouth/Throat:     Lips: Pink. No lesions.     Mouth: Mucous membranes are moist.     Pharynx: Oropharynx is clear. No pharyngeal swelling or oropharyngeal exudate.  Cardiovascular:     Rate and Rhythm: Normal rate and regular rhythm.  Pulmonary:     Effort:  Pulmonary effort is normal.     Breath sounds: Normal breath sounds.  Musculoskeletal:        General: Normal range of motion.     Right lower leg: No edema.     Left lower leg: No edema.  Skin:    General: Skin is warm and dry.     Findings: No rash.  Neurological:     Mental Status: She is alert and oriented to person, place, and time. Mental status is at baseline.  Psychiatric:        Mood and Affect: Mood normal.        Behavior: Behavior normal.        Thought Content: Thought content normal.        Judgment: Judgment normal.     No results found for any visits on 11/04/23.      Assessment & Plan:  Cough productive  of purulent sputum -     Azithromycin; 2-tabs on day and 1-tab daily until done  Dispense: 6 each; Refill: 0  Acute cough -     Novel Coronavirus, NAA ([facility]) -     Veritor Flu A/B Waived -     Rapid Strep Screen ([facility] ONLY) -     Azithromycin; 2-tabs on day and 1-tab daily until done  Dispense: 6 each; Refill: 0  Sore throat -     Novel Coronavirus, NAA ([facility]) -     Veritor Flu A/B Waived -     Rapid Strep Screen ([facility] ONLY) -     Azithromycin; 2-tabs on day and 1-tab daily until done  Dispense: 6 each; Refill: 0  Nasal congestion -     Novel Coronavirus, NAA ([facility]) -     Veritor Flu A/B Waived -     Rapid Strep Screen ([facility] ONLY) -     Azithromycin; 2-tabs on day and 1-tab daily until done  Dispense: 6 each; Refill: 0   Continue healthy lifestyle choices, including diet (rich in fruits, vegetables, and lean proteins, and low in salt and simple carbohydrates) and exercise (at least 30 minutes of moderate physical activity daily).   Poetry 72 year old Caucasian female seen today for URI, no acute distress Plan of care COVID pending POC flu A& B chest and strep negative URI with cough: She is instructed to get over-the-counter Coricidin: Zithromax No. 6 dispense client instructed to follow instruction on the  box Increase hydration  The above assessment and management plan was discussed with the patient. The patient verbalized understanding of and has agreed to the management plan. Patient is aware to call the clinic if they develop any new symptoms or if symptoms persist or worsen. Patient is aware when to return to the clinic for a follow-up visit. Patient educated on when it is appropriate to go to the emergency department.  Return for follow as already scheduled with PCP.  Arrie Aran Santa Lighter, [facility] [address] [place], [place] [address] [phone]  Note: This document was prepared by [employer] voice dictation technology and any errors that results from this process are unintentional.

## 2023-11-04 NOTE — Patient Instructions (Signed)

## 2023-11-05 ENCOUNTER — Other Ambulatory Visit: Payer: Self-pay | Admitting: Family Medicine

## 2023-11-05 ENCOUNTER — Telehealth: Payer: Self-pay | Admitting: Family Medicine

## 2023-11-05 DIAGNOSIS — F1320 Sedative, hypnotic or anxiolytic dependence, uncomplicated: Secondary | ICD-10-CM

## 2023-11-05 DIAGNOSIS — F411 Generalized anxiety disorder: Secondary | ICD-10-CM

## 2023-11-05 LAB — NOVEL CORONAVIRUS, NAA: SARS-CoV-2, NAA: NOT DETECTED

## 2023-11-05 NOTE — Telephone Encounter (Signed)
Patient

## 2023-11-05 NOTE — Telephone Encounter (Signed)
Pt has a appt on the 16th of dec but she needs to be seen sooner then that she is out of meds

## 2023-11-06 NOTE — Telephone Encounter (Signed)
No openings with PCP before 12/16. PCP is booked today and tomorrow and is off on Fridays.

## 2023-11-11 ENCOUNTER — Encounter: Payer: Self-pay | Admitting: Family Medicine

## 2023-11-11 ENCOUNTER — Ambulatory Visit: Payer: Medicare PPO | Admitting: Family Medicine

## 2023-11-11 VITALS — BP 129/66 | HR 68 | Temp 97.7°F | Ht 62.0 in | Wt 157.4 lb

## 2023-11-11 DIAGNOSIS — J40 Bronchitis, not specified as acute or chronic: Secondary | ICD-10-CM

## 2023-11-11 DIAGNOSIS — F411 Generalized anxiety disorder: Secondary | ICD-10-CM

## 2023-11-11 DIAGNOSIS — J329 Chronic sinusitis, unspecified: Secondary | ICD-10-CM

## 2023-11-11 DIAGNOSIS — E782 Mixed hyperlipidemia: Secondary | ICD-10-CM

## 2023-11-11 DIAGNOSIS — I10 Essential (primary) hypertension: Secondary | ICD-10-CM | POA: Diagnosis not present

## 2023-11-11 DIAGNOSIS — F1320 Sedative, hypnotic or anxiolytic dependence, uncomplicated: Secondary | ICD-10-CM | POA: Diagnosis not present

## 2023-11-11 DIAGNOSIS — Z7984 Long term (current) use of oral hypoglycemic drugs: Secondary | ICD-10-CM

## 2023-11-11 DIAGNOSIS — E1122 Type 2 diabetes mellitus with diabetic chronic kidney disease: Secondary | ICD-10-CM | POA: Diagnosis not present

## 2023-11-11 DIAGNOSIS — N1831 Chronic kidney disease, stage 3a: Secondary | ICD-10-CM

## 2023-11-11 DIAGNOSIS — M7989 Other specified soft tissue disorders: Secondary | ICD-10-CM

## 2023-11-11 LAB — LIPID PANEL

## 2023-11-11 LAB — BAYER DCA HB A1C WAIVED: HB A1C (BAYER DCA - WAIVED): 6.3 % — ABNORMAL HIGH (ref 4.8–5.6)

## 2023-11-11 MED ORDER — ALPRAZOLAM 1 MG PO TABS
ORAL_TABLET | ORAL | 5 refills | Status: DC
Start: 2023-11-11 — End: 2024-04-16

## 2023-11-11 MED ORDER — PANTOPRAZOLE SODIUM 40 MG PO TBEC: 40.0000 mg | DELAYED_RELEASE_TABLET | Freq: Every day | ORAL | 11 refills | Status: DC

## 2023-11-11 MED ORDER — MOXIFLOXACIN HCL 400 MG PO TABS: 400.0000 mg | ORAL_TABLET | Freq: Every day | ORAL | 0 refills | Status: DC

## 2023-11-11 MED ORDER — FUROSEMIDE 20 MG PO TABS: 20.0000 mg | ORAL_TABLET | Freq: Every day | ORAL | 1 refills | Status: DC

## 2023-11-11 MED ORDER — HYDROCODONE BIT-HOMATROP MBR 5-1.5 MG/5ML PO SOLN: 5.0000 mL | Freq: Four times a day (QID) | ORAL | 0 refills | Status: AC | PRN

## 2023-11-11 NOTE — Progress Notes (Signed)
Subjective:  Patient ID: Yvonne [name],  female    DOB: 1951-06-15  Age: 72 y.o.    CC: Medical Management of Chronic Issues   HPI Yvonne [name] presents for  follow-up of hypertension. Patient has no history of headache chest pain or shortness of breath or recent cough. Patient also denies symptoms of TIA such as numbness weakness lateralizing. Patient denies side effects from medication. States taking it regularly.  Patient also  in for follow-up of elevated cholesterol. Doing well without complaints on current medication. Denies side effects  including myalgia and arthralgia and nausea. Also in today for liver function testing. Currently no chest pain, shortness of breath or other cardiovascular related symptoms noted.  Follow-up of diabetes. Patient does check blood sugar at home. Readings staying 100-150 mostly Patient denies symptoms such as excessive hunger or urinary frequency, excessive hunger, nausea No significant hypoglycemic spells noted. Medications reviewed. Pt reports taking them regularly. Pt. denies complication/adverse reaction today.    History Yvonne [name] has a past medical history of (HFpEF) heart failure with preserved ejection fraction (HCC), Anxiety, Aortic atherosclerosis (HCC), Arthritis, Asthma, CAD (coronary artery disease), Carotid artery disease (HCC), Chronic cough, Chronic otitis media (12/2017), Full dentures, GERD (gastroesophageal reflux disease), History of stroke (09/2017), Hyperlipidemia, Hypertension, Non-insulin dependent type 2 diabetes mellitus (HCC), Orthostatic hypotension, Overactive bladder, PSVT (paroxysmal supraventricular tachycardia) (HCC), Recurrent acute suppurative otitis media without spontaneous rupture of left tympanic membrane (02/19/2018), Stroke (HCC), and Transient cerebral ischemia.   She has a past surgical history that includes Cholecystectomy; Colonoscopy (N/A, 08/17/2015); Abdominal hysterectomy; Lacrimal duct exploration  (Bilateral); Cataract extraction w/ intraocular lens implant (Right); Vitrectomy (Left, 09/14/2015); Gas insertion (Right); Myringotomy with tube placement (Bilateral, 01/28/2018); IR ANGIO INTRA EXTRACRAN SEL INTERNAL CAROTID BILAT MOD SED (10/22/2023); and IR ANGIO VERTEBRAL SEL VERTEBRAL UNI L MOD SED (10/22/2023).   Her family history includes Arthritis in her brother and mother; Arthritis-Osteo in her son; Asthma in her father; Breast cancer in her mother; CVA in her mother; Cancer in her brother; Diabetes in her father and mother; Heart disease in her father; Hepatitis C in her sister; Peripheral Artery Disease in her daughter; Stroke in her mother.She reports that she has never smoked. She has never used smokeless tobacco. She reports that she does not currently use alcohol. She reports that she does not use drugs.  Current Outpatient Medications on File Prior to Visit  Medication Sig Dispense Refill   ACCU-CHEK GUIDE test strip TEST BLOOD SUGAR 4 TIMES DAILY DX E11.69 400 strip 3   Accu-Chek Softclix Lancets lancets Test BS up to 4 times daily Dx E11.69 400 each 3   albuterol (VENTOLIN HFA) 108 (90 Base) MCG/ACT inhaler TAKE 2 PUFFS BY MOUTH EVERY 6 HOURS AS NEEDED FOR WHEEZE OR SHORTNESS OF BREATH 54 each 1   alendronate (FOSAMAX) 70 MG tablet Take 1 tablet (70 mg total) by mouth once a week. Take with a full glass of water on an empty stomach. 12 tablet 3   amLODipine (NORVASC) 10 MG tablet TAKE 1 TABLET BY MOUTH EVERY DAY 90 tablet 3   apixaban (ELIQUIS) 5 MG TABS tablet Take 1 tablet (5 mg total) by mouth 2 (two) times daily.     atorvastatin (LIPITOR) 80 MG tablet Take 1 tablet (80 mg total) by mouth daily. 90 tablet 2   Bempedoic Acid-Ezetimibe (NEXLIZET) 180-10 MG TABS Take 1 tablet by mouth daily. Take 180 mg by mouth daily. 90 tablet 3   Blood Glucose Monitoring  Suppl (ACCU-CHEK GUIDE) w/Device KIT Test Bs QID Dx E11.69 1 kit 0   clopidogrel (PLAVIX) 75 MG tablet Take 1 tablet (75 mg  total) by mouth daily.     conjugated estrogens (PREMARIN) vaginal cream Use 0.5 gm in vagina nightly for 1 week then 2 x weekly 12 g 0   dapagliflozin propanediol (FARXIGA) 10 MG TABS tablet Take 1 tablet (10 mg total) by mouth daily. 90 tablet 5   famotidine (PEPCID) 20 MG tablet Take 1 tablet (20 mg total) by mouth at bedtime. 90 tablet 3   glipiZIDE (GLUCOTROL) 5 MG tablet Take 1 tablet (5 mg total) by mouth daily before breakfast. 90 tablet 3   oxybutynin (DITROPAN-XL) 5 MG 24 hr tablet Take 1 tablet (5 mg total) by mouth at bedtime. 90 tablet 3   tirzepatide (MOUNJARO) 10 MG/0.5ML Pen Inject 10 mg into the skin once a week. 6 mL 3   Current Facility-Administered Medications on File Prior to Visit  Medication Dose Route Frequency Provider Last Rate Last Admin   promethazine (PHENERGAN) injection 25 mg  25 mg Intramuscular Q6H PRN Daphine Deutscher, Mary-Margaret, FNP   25 mg at 12/28/22 1316    ROS Review of Systems  Constitutional: Negative.   HENT: Negative.    Eyes:  Negative for visual disturbance.  Respiratory:  Positive for cough (still  coughing profusely and producing yellow sputum). Negative for shortness of breath.   Cardiovascular:  Negative for chest pain.  Gastrointestinal:  Negative for abdominal pain.  Musculoskeletal:  Negative for arthralgias.    Objective:  BP 129/66   Pulse 68   Temp 97.7 F (36.5 C)   Ht 5\' 2"  (1.575 m)   Wt 157 lb 6.4 oz (71.4 kg)   SpO2 96%   BMI 28.79 kg/m   BP Readings from Last 3 Encounters:  11/11/23 129/66  11/04/23 135/69  10/22/23 (!) 155/78    Wt Readings from Last 3 Encounters:  11/11/23 157 lb 6.4 oz (71.4 kg)  11/04/23 161 lb (73 kg)  10/22/23 158 lb (71.7 kg)     Physical Exam Constitutional:      General: She is not in acute distress.    Appearance: She is well-developed.  HENT:     Head: Normocephalic and atraumatic.  Eyes:     Conjunctiva/sclera: Conjunctivae normal.     Pupils: Pupils are equal, round, and  reactive to light.  Neck:     Thyroid: No thyromegaly.  Cardiovascular:     Rate and Rhythm: Normal rate and regular rhythm.     Heart sounds: Normal heart sounds. No murmur heard. Pulmonary:     Effort: Pulmonary effort is normal. No respiratory distress.     Breath sounds: Rhonchi (few, with bronchitic changes) present. No wheezing or rales.  Abdominal:     General: Bowel sounds are normal. There is no distension.     Palpations: Abdomen is soft.     Tenderness: There is no abdominal tenderness.  Musculoskeletal:        General: Normal range of motion.     Cervical back: Normal range of motion and neck supple.  Lymphadenopathy:     Cervical: No cervical adenopathy.  Skin:    General: Skin is warm and dry.  Neurological:     Mental Status: She is alert and oriented to person, place, and time.  Psychiatric:        Behavior: Behavior normal.        Thought Content: Thought content normal.  Judgment: Judgment normal.     Diabetic Foot Exam - Simple   No data filed     Lab Results  Component Value Date   HGBA1C 6.3 (H) 11/11/2023   HGBA1C 6.0 (H) 08/12/2023   HGBA1C 5.8 (H) 05/09/2023    Assessment & Plan:   Angelyna was seen today for medical management of chronic issues.  Diagnoses and all orders for this visit:  Type 2 diabetes mellitus with stage 3a chronic kidney disease, without long-term current use of insulin (HCC) -     Bayer DCA Hb A1c Waived -     Microalbumin / creatinine urine ratio  Mixed hyperlipidemia -     Lipid panel  Primary hypertension -     CBC with Differential/Platelet -     CMP14+EGFR  GAD (generalized anxiety disorder) -     ALPRAZolam (XANAX) 1 MG tablet; ! Each morning, one each evening, and 1/2 each afternoon  Benzodiazepine dependence, continuous (HCC) -     ALPRAZolam (XANAX) 1 MG tablet; ! Each morning, one each evening, and 1/2 each afternoon  Swelling of lower extremity -     furosemide (LASIX) 20 MG tablet; Take 1  tablet (20 mg total) by mouth daily.  Sinobronchitis  Other orders -     pantoprazole (PROTONIX) 40 MG tablet; Take 1 tablet (40 mg total) by mouth daily. For stomach -     moxifloxacin (AVELOX) 400 MG tablet; Take 1 tablet (400 mg total) by mouth daily. -     HYDROcodone bit-homatropine (HYCODAN) 5-1.5 MG/5ML syrup; Take 5 mLs by mouth every 6 (six) hours as needed for up to 5 days for cough.   I have discontinued Lawernce Keas. Vader's azithromycin. I am also having her start on moxifloxacin and HYDROcodone bit-homatropine. Additionally, I am having her maintain her Accu-Chek Softclix Lancets, Accu-Chek Guide, albuterol, Premarin, amLODipine, glipiZIDE, atorvastatin, Nexlizet, Accu-Chek Guide, alendronate, oxybutynin, tirzepatide, dapagliflozin propanediol, famotidine, apixaban, clopidogrel, ALPRAZolam, furosemide, and pantoprazole. We will continue to administer promethazine.  Meds ordered this encounter  Medications   ALPRAZolam (XANAX) 1 MG tablet    Sig: ! Each morning, one each evening, and 1/2 each afternoon    Dispense:  75 tablet    Refill:  5   furosemide (LASIX) 20 MG tablet    Sig: Take 1 tablet (20 mg total) by mouth daily.    Dispense:  90 tablet    Refill:  1   pantoprazole (PROTONIX) 40 MG tablet    Sig: Take 1 tablet (40 mg total) by mouth daily. For stomach    Dispense:  30 tablet    Refill:  11   moxifloxacin (AVELOX) 400 MG tablet    Sig: Take 1 tablet (400 mg total) by mouth daily.    Dispense:  10 tablet    Refill:  0   HYDROcodone bit-homatropine (HYCODAN) 5-1.5 MG/5ML syrup    Sig: Take 5 mLs by mouth every 6 (six) hours as needed for up to 5 days for cough.    Dispense:  100 mL    Refill:  0     Follow-up: No follow-ups on file.  Mechele Claude, M.D.

## 2023-11-12 LAB — LIPID PANEL
Cholesterol, Total: 165 mg/dL (ref 100–199)
HDL: 53 mg/dL (ref >39)
LDL CALC COMMENT:: 3.1 ratio (ref 0.0–4.4)
LDL Chol Calc (NIH): 94 mg/dL (ref 0–99)
Triglycerides: 98 mg/dL (ref 0–149)
VLDL Cholesterol Cal: 18 mg/dL (ref 5–40)

## 2023-11-12 LAB — CMP14+EGFR
ALT: 10 IU/L (ref 0–32)
AST: 18 IU/L (ref 0–40)
Albumin: 3.8 g/dL (ref 3.8–4.8)
Alkaline Phosphatase: 94 IU/L (ref 44–121)
BUN/Creatinine Ratio: 18 (ref 12–28)
BUN: 22 mg/dL (ref 8–27)
Bilirubin Total: 0.3 mg/dL (ref 0.0–1.2)
CO2: 24 mmol/L (ref 20–29)
Calcium: 9.8 mg/dL (ref 8.7–10.3)
Chloride: 105 mmol/L (ref 96–106)
Creatinine, Ser: 1.23 mg/dL — ABNORMAL HIGH (ref 0.57–1.00)
Globulin, Total: 2.2 g/dL (ref 1.5–4.5)
Glucose: 86 mg/dL (ref 70–99)
Potassium: 4 mmol/L (ref 3.5–5.2)
Sodium: 145 mmol/L — ABNORMAL HIGH (ref 134–144)
Total Protein: 6 g/dL (ref 6.0–8.5)
eGFR: 46 mL/min/{1.73_m2} — ABNORMAL LOW (ref >59)

## 2023-11-12 LAB — CBC WITH DIFFERENTIAL/PLATELET
Basophils Absolute: 0 10*3/uL (ref 0.0–0.2)
Basos: 1 %
EOS (ABSOLUTE): 0.1 10*3/uL (ref 0.0–0.4)
Eos: 2 %
Hematocrit: 37.9 % (ref 34.0–46.6)
Hemoglobin: 12.5 g/dL (ref 11.1–15.9)
Immature Grans (Abs): 0 10*3/uL (ref 0.0–0.1)
Immature Granulocytes: 0 %
Lymphocytes Absolute: 2.1 10*3/uL (ref 0.7–3.1)
Lymphs: 45 %
MCH: 29.7 pg (ref 26.6–33.0)
MCHC: 33 g/dL (ref 31.5–35.7)
MCV: 90 fL (ref 79–97)
Monocytes Absolute: 0.4 10*3/uL (ref 0.1–0.9)
Monocytes: 8 %
Neutrophils Absolute: 2 10*3/uL (ref 1.4–7.0)
Neutrophils: 44 %
Platelets: 261 10*3/uL (ref 150–450)
RBC: 4.21 x10E6/uL (ref 3.77–5.28)
RDW: 12.4 % (ref 11.7–15.4)
WBC: 4.5 10*3/uL (ref 3.4–10.8)

## 2023-11-12 LAB — MICROALBUMIN / CREATININE URINE RATIO
Creatinine, Urine: 714.4 mg/dL
Microalb/Creat Ratio: 10 mg/g{creat} (ref 0–29)
Microalbumin, Urine: 73.6 ug/mL

## 2023-11-28 ENCOUNTER — Other Ambulatory Visit: Payer: Self-pay | Admitting: Family Medicine

## 2023-11-28 ENCOUNTER — Telehealth: Payer: Self-pay | Admitting: Family Medicine

## 2023-11-28 MED ORDER — APIXABAN 5 MG PO TABS: 5.0000 mg | ORAL_TABLET | Freq: Two times a day (BID) | ORAL | 3 refills | Status: DC

## 2023-11-28 NOTE — Telephone Encounter (Unsigned)
 Copied from CRM [id number]. Topic: Clinical - Prescription Issue >> Nov 28, 2023  1:10 PM Yvonne [name] wrote: Reason for CRM: Pt was refused ELIQUIS 5 MG TABS tablet, pt is requesting a call back at [phone]

## 2023-11-28 NOTE — Telephone Encounter (Signed)
 Please let the patient know that I sent their prescription to their pharmacy. Thanks, WS

## 2023-11-28 NOTE — Telephone Encounter (Signed)
 Patient aware and verbalized understanding.

## 2023-11-28 NOTE — Telephone Encounter (Signed)
 Patient states her eliquis was denied when looking up nurse refused it because cardiology prescribed, but she no longer sees them and thought dr. Darlyn Read would prescribe it please advise

## 2023-11-28 NOTE — Telephone Encounter (Signed)
 Copied from CRM [id number]. Topic: Clinical - Prescription Issue >> Nov 28, 2023  1:10 PM Ivette P wrote: Reason for CRM: Pt was refused ELIQUIS 5 MG TABS tablet, pt is requesting a call back at [phone]

## 2023-12-05 ENCOUNTER — Ambulatory Visit: Payer: Self-pay | Admitting: Family Medicine

## 2023-12-05 NOTE — Telephone Encounter (Signed)
  Chief Complaint: Right shoulder pain Symptoms: 8/10 pain in right shoulder Frequency: Following a fall this evening Pertinent Negatives: Patient denies Denies new numbness/tingling, weakness, wound Disposition: [] ED /[] Urgent Care (no appt availability in office) / [x] Appointment(In office/virtual)/ []  [facility]/ [] Home Care/ [] Refused Recommended Disposition /[] [facility]/ []  Follow-up with PCP Additional Notes: Pt reports about 30-40 min ago she tripped over her dog and fell and landed on her right shoulder. Pt reports this shoulder has been bothering her for the last couple weeks but the pain is increased significantly to 8/10 since the fall. Pt denies any decrease in ROM, no weakness, pt reports numbness and tingling in her fingers that she states have been present since her previous stroke and are unchanged. Pt scheduled OV tomorrow AM. This RN educated pt on home care, new-worsening symptoms, when to call back/seek emergent care. Pt verbalized understanding and agrees to plan.   Copied from CRM [id number]. Topic: Clinical - Red Word Triage >> Dec 05, 2023  5:01 PM Laurier C wrote: Red Word that prompted transfer to Nurse Triage: Patient fell over her dog, pain level is about a 7-8. Pain is starting to become unbearable Reason for Disposition  [1] MODERATE pain (e.g., interferes with normal activities) AND [2] high-risk adult (e.g., age > 60 years, osteoporosis, chronic steroid use)  Answer Assessment - Initial Assessment Questions 1. MECHANISM: How did the injury happen?      Pt tripped over her dog, fell on right shoulder 2. WHEN: When did the injury happen? (Minutes or hours ago)      About 30-40 minutes ago 3. LOCATION: Which shoulder is injured?     Right 4. APPEARANCE of INJURY: What does the injury look like?      Shoulder appears normal 5. SEVERITY: Can your child move the shoulder normally? (Can reach up to the sky, out to the side)      Yes,  just painful 6. SIZE: For bruises or swelling, ask: How large is it? (Inches or centimeters)      None 7. PAIN: Is there pain? If so, ask: How bad is the pain?      8/10 constant  Answer Assessment - Initial Assessment Questions 1. MECHANISM: How did the injury happen?     Tripped over the dog, fell on right shoulder 2. ONSET: When did the injury happen? (Minutes or hours ago)      30-40 minutes ago 3. APPEARANCE of INJURY: What does the injury look like?      Appears normal 4. SEVERITY: Can you move the shoulder normally?      Yes 5. SIZE: For cuts, bruises, or swelling, ask: How large is it? (e.g., inches or centimeters;  entire joint)      None 6. PAIN: Is there pain? If Yes, ask: How bad is the pain?   (e.g., Scale 1-10; or mild, moderate, severe)   - MILD (1-3): doesn't interfere with normal activities   - MODERATE (4-7): interferes with normal activities (e.g., work or school) or awakens from sleep   - SEVERE (8-10): excruciating pain, unable to do any normal activities, unable to move arm at all due to pain     8/10   8. OTHER SYMPTOMS: Do you have any other symptoms? (e.g., loss of sensation)     No new symptoms  Protocols used: Shoulder Injury-P-AH, Shoulder Injury-A-AH

## 2023-12-06 ENCOUNTER — Ambulatory Visit: Payer: Medicare PPO | Admitting: Family Medicine

## 2023-12-06 ENCOUNTER — Encounter: Payer: Self-pay | Admitting: Family Medicine

## 2023-12-06 ENCOUNTER — Ambulatory Visit (INDEPENDENT_AMBULATORY_CARE_PROVIDER_SITE_OTHER): Payer: Medicare PPO

## 2023-12-06 VITALS — BP 142/80 | Ht 62.0 in

## 2023-12-06 DIAGNOSIS — I700 Atherosclerosis of aorta: Secondary | ICD-10-CM | POA: Diagnosis not present

## 2023-12-06 DIAGNOSIS — W19XXXA Unspecified fall, initial encounter: Secondary | ICD-10-CM | POA: Diagnosis not present

## 2023-12-06 DIAGNOSIS — M85811 Other specified disorders of bone density and structure, right shoulder: Secondary | ICD-10-CM | POA: Diagnosis not present

## 2023-12-06 DIAGNOSIS — M25511 Pain in right shoulder: Secondary | ICD-10-CM

## 2023-12-06 MED ORDER — CYCLOBENZAPRINE HCL 10 MG PO TABS: 10.0000 mg | ORAL_TABLET | Freq: Three times a day (TID) | ORAL | 0 refills | Status: AC | PRN

## 2023-12-06 MED ORDER — DICLOFENAC SODIUM 1 % EX GEL: 2.0000 g | Freq: Four times a day (QID) | CUTANEOUS | 0 refills | Status: DC

## 2023-12-06 NOTE — Progress Notes (Signed)
 BP (!) 142/80   Ht 5' 2 (1.575 m)   BMI 28.79 kg/m    Subjective:   Patient ID: Yvonne [name], female    DOB: January 16, 1951, 73 y.o.   MRN: [id number]  HPI: Yvonne [name] is a 73 y.o. female presenting on 12/06/2023 for Fall and Shoulder Pain (right)   HPI Patient is coming in today with right shoulder pain.  She has been having the right shoulder pain for couple weeks that is been bothering her and it has just been irritated but she is coming in today because yesterday afternoon she had a fall and she slipped going up the stairs and landed right on that right shoulder on the top of her right shoulder on a step that was concrete.  She says since then she is having a lot of pain and trouble lifting that arm and irritation and inflammation.  She feels like she has a bump on the top of her shoulder and it feels painful.  She denies any significant pain anywhere else except that shoulder.  Relevant past medical, surgical, family and social history reviewed and updated as indicated. Interim medical history since our last visit reviewed. Allergies and medications reviewed and updated.  Review of Systems  Constitutional:  Negative for chills and fever.  Eyes:  Negative for redness and visual disturbance.  Respiratory:  Negative for chest tightness and shortness of breath.   Cardiovascular:  Negative for chest pain and leg swelling.  Musculoskeletal:  Positive for arthralgias and myalgias. Negative for back pain and gait problem.  Skin:  Negative for rash.  Neurological:  Negative for dizziness, light-headedness and headaches.  Psychiatric/Behavioral:  Negative for agitation and behavioral problems.   All other systems reviewed and are negative.   Per HPI unless specifically indicated above   Allergies as of 12/06/2023       Reactions   Penicillins Hives, Itching   Has patient had a PCN reaction causing immediate rash, facial/tongue/throat swelling, SOB or lightheadedness with hypotension:  Yes Has patient had a PCN reaction causing severe rash involving mucus membranes or skin necrosis: Unk Has patient had a PCN reaction that required hospitalization: No Has patient had a PCN reaction occurring within the last 10 years: No If all of the above answers are NO, then may proceed with Cephalosporin use. Has patient had a PCN reaction causing immediate rash, facial/tongue/throat swelling, SOB or lightheadedness with hypotension: Yes Has patient had a PCN reaction causing severe rash involving mucus membranes or skin necrosis: No Has patient had a PCN reaction that required hospitalization No Has patient had a PCN reaction occurring within the last 10 years: No If all of the above answers are NO, then may proceed with Cephalosporin use. Has patient had a PCN reaction causing immediate rash, facial/tongue/throat swelling, SOB or lightheadedness with hypotension: Yes Has patient had a PCN reaction causing severe rash involving mucus membranes or skin necrosis: Unk Has patient had a PCN reaction that required hospitalization: No Has patient had a PCN reaction occurring within the last 10 years: No If all of the above answers are NO, then may proceed with Cephalosporin use. Has patient had a PCN reaction causing immediate rash, facial/tongue/throat swelling, SOB or lightheadedness with hypotension: Yes Has patient had a PCN reaction causing severe rash involving mucus membranes or skin necrosis: No Has patient had a PCN reaction that required hospitalization No Has patient had a PCN reaction occurring within the last 10 years: No If all of  the above answers are NO, then may proceed with Cephalosporin use. Has patient had a PCN reaction causing immediate rash, facial/tongue/throat swelling, SOB or lightheadedness with hypotension: Yes Has patient had a PCN reaction causing sev... (TRUNCATED)   Metformin  And Related Diarrhea   dizziness   Gabapentin  Other (See Comments)   Causes a  lot of lethargy at a higher dose Causes a lot of lethargy at a higher dose Causes a lot of lethargy at a higher dose   Lisinopril Cough   HAS A CHRONIC COUGH AND DID NOT NOTICE IMPROVEMENT OF COUGH WHEN LISINOPRIL WAS STOPPED HAS A CHRONIC COUGH AND DID NOT NOTICE IMPROVEMENT OF COUGH WHEN LISINOPRIL WAS STOPPED HAS A CHRONIC COUGH AND DID NOT NOTICE IMPROVEMENT OF COUGH WHEN LISINOPRIL WAS STOPPED   Soap Rash   DIAL soap        Medication List        Accurate as of December 06, 2023  9:31 AM. If you have any questions, ask your nurse or doctor.          Accu-Chek Guide test strip Generic drug: glucose blood TEST BLOOD SUGAR 4 TIMES DAILY DX E11.69   Accu-Chek Guide w/Device Kit Test Bs QID Dx E11.69   Accu-Chek Softclix Lancets lancets Test BS up to 4 times daily Dx E11.69   albuterol  108 (90 Base) MCG/ACT inhaler Commonly known as: VENTOLIN  HFA TAKE 2 PUFFS BY MOUTH EVERY 6 HOURS AS NEEDED FOR WHEEZE OR SHORTNESS OF BREATH   alendronate  70 MG tablet Commonly known as: FOSAMAX  Take 1 tablet (70 mg total) by mouth once a week. Take with a full glass of water  on an empty stomach.   ALPRAZolam  1 MG tablet Commonly known as: XANAX  ! Each morning, one each evening, and 1/2 each afternoon   amLODipine  10 MG tablet Commonly known as: NORVASC  TAKE 1 TABLET BY MOUTH EVERY DAY   apixaban  5 MG Tabs tablet Commonly known as: Eliquis  Take 1 tablet (5 mg total) by mouth 2 (two) times daily.   atorvastatin  80 MG tablet Commonly known as: LIPITOR  Take 1 tablet (80 mg total) by mouth daily.   clopidogrel  75 MG tablet Commonly known as: PLAVIX  Take 1 tablet (75 mg total) by mouth daily.   cyclobenzaprine  10 MG tablet Commonly known as: FLEXERIL  Take 1 tablet (10 mg total) by mouth 3 (three) times daily as needed for muscle spasms. Started by: Fonda LABOR Tierre Gerard   dapagliflozin  propanediol 10 MG Tabs tablet Commonly known as: FARXIGA  Take 1 tablet (10 mg total) by  mouth daily.   diclofenac  Sodium 1 % Gel Commonly known as: Voltaren  Apply 2 g topically 4 (four) times daily. Started by: Fonda LABOR Alie Moudy   famotidine  20 MG tablet Commonly known as: PEPCID  Take 1 tablet (20 mg total) by mouth at bedtime.   furosemide  20 MG tablet Commonly known as: LASIX  Take 1 tablet (20 mg total) by mouth daily.   glipiZIDE  5 MG tablet Commonly known as: GLUCOTROL  Take 1 tablet (5 mg total) by mouth daily before breakfast.   moxifloxacin  400 MG tablet Commonly known as: AVELOX  Take 1 tablet (400 mg total) by mouth daily.   Nexlizet  180-10 MG Tabs Generic drug: Bempedoic Acid-Ezetimibe  Take 1 tablet by mouth daily. Take 180 mg by mouth daily.   oxybutynin  5 MG 24 hr tablet Commonly known as: DITROPAN -XL Take 1 tablet (5 mg total) by mouth at bedtime.   pantoprazole  40 MG tablet Commonly known as: PROTONIX  Take 1 tablet (  40 mg total) by mouth daily. For stomach   Premarin  vaginal cream Generic drug: conjugated estrogens  Use 0.5 gm in vagina nightly for 1 week then 2 x weekly   tirzepatide  10 MG/0.5ML Pen Commonly known as: MOUNJARO  Inject 10 mg into the skin once a week.         Objective:   BP (!) 142/80   Ht 5' 2 (1.575 m)   BMI 28.79 kg/m   Wt Readings from Last 3 Encounters:  11/11/23 157 lb 6.4 oz (71.4 kg)  11/04/23 161 lb (73 kg)  10/22/23 158 lb (71.7 kg)    Physical Exam Vitals and nursing note reviewed.  Constitutional:      General: She is not in acute distress.    Appearance: She is well-developed. She is not diaphoretic.  Eyes:     Conjunctiva/sclera: Conjunctivae normal.  Musculoskeletal:     Right shoulder: Tenderness and bony tenderness present. No crepitus. Decreased range of motion (Decreased abduction range of motion and pain with abduction and extension of the shoulder). Normal strength.  Skin:    General: Skin is warm and dry.     Findings: No rash.  Neurological:     Mental Status: She is alert and  oriented to person, place, and time.     Coordination: Coordination normal.  Psychiatric:        Behavior: Behavior normal.     Shoulder xr: No signs of acute bony abnormality  Assessment & Plan:   Problem List Items Addressed This Visit   None Visit Diagnoses       Fall, initial encounter    -  Primary   Relevant Medications   diclofenac  Sodium (VOLTAREN ) 1 % GEL   cyclobenzaprine  (FLEXERIL ) 10 MG tablet   Other Relevant Orders   DG Shoulder Right     Acute pain of right shoulder       Relevant Medications   diclofenac  Sodium (VOLTAREN ) 1 % GEL   cyclobenzaprine  (FLEXERIL ) 10 MG tablet   Other Relevant Orders   DG Shoulder Right     Arthralgia of right acromioclavicular joint       Relevant Medications   diclofenac  Sodium (VOLTAREN ) 1 % GEL   cyclobenzaprine  (FLEXERIL ) 10 MG tablet     Pain and inflammation over the A/C joint area, does not look to be separated based on the x-ray but she may have some inflammation from the trauma to it.  Will give a muscle relaxer and a topical anti-inflammatory because she cannot take oral because of her blood thinners.  If not improved from there then may consider orthopedic referral in the future.  Follow up plan: Return if symptoms worsen or fail to improve.  Counseling provided for all of the vaccine components Orders Placed This Encounter  Procedures   DG Shoulder Right    Fonda Levins, MD [facility] 12/06/2023, 9:31 AM

## 2023-12-10 ENCOUNTER — Telehealth: Payer: Self-pay | Admitting: Neurology

## 2023-12-10 NOTE — Telephone Encounter (Signed)
 Pt called to cancel appointment. Pt said PCP did a emergency referral to another neurologist and had a angioplasty done.

## 2023-12-16 ENCOUNTER — Encounter: Payer: Self-pay | Admitting: Family Medicine

## 2023-12-17 ENCOUNTER — Ambulatory Visit: Payer: Medicare PPO | Admitting: Neurology

## 2023-12-26 ENCOUNTER — Telehealth: Payer: Self-pay | Admitting: Family Medicine

## 2023-12-26 DIAGNOSIS — I669 Occlusion and stenosis of unspecified cerebral artery: Secondary | ICD-10-CM | POA: Diagnosis not present

## 2023-12-26 DIAGNOSIS — Z6829 Body mass index (BMI) 29.0-29.9, adult: Secondary | ICD-10-CM | POA: Diagnosis not present

## 2023-12-26 NOTE — Telephone Encounter (Signed)
Received notification from [employer] regarding approval for [facility] Patient assistance approved from 10/10/23 to 11/25/24.  Medication will ship to PATIENTS HOME  Pt ID: [id number]  Company phone: [phone]  Please send new RX to [facility] pharmacy for new enrollment (per rep).

## 2023-12-26 NOTE — Telephone Encounter (Signed)
Message already sent to provider regarding referral. LS

## 2023-12-26 NOTE — Telephone Encounter (Signed)
Copied from CRM [id number]. Topic: Referral - Status >> Dec 26, 2023 11:31 AM Herbert Seta B wrote: Reason for CRM: Patient calling because had stroke several months ago was PCP was supposed to submit referral [phone]

## 2023-12-26 NOTE — Telephone Encounter (Unsigned)
Copied from CRM [id number]. Topic: Referral - Request for Referral >> Dec 26, 2023  3:29 PM Antony Haste wrote: Did the patient discuss referral with their provider in the last year? Yes (If No - schedule appointment) (If Yes - send message)  Appointment offered? Yes  Type of order/referral and detailed reason for visit: Stroke Doctor - Vascular Neurologist  Preference of office, provider, location:  Darcella Cheshire, MD [facility] [address] [place] Office #: [phone]  If referral order, have you been seen by this specialty before? No (If Yes, this issue or another issue? When? Where?  Can we respond through MyChart? No, preferably a callback: [phone]

## 2023-12-27 ENCOUNTER — Telehealth: Payer: Self-pay | Admitting: Family Medicine

## 2023-12-27 NOTE — Telephone Encounter (Signed)
Per pt she was told there is not a vascular neurologist in the office she was referred to. Pt was told to see the neurology office on [address]. She does not remember the office name or providers name.  The only neuro office on [address] in [place] is [facility].  Referral placed today. Pt aware that it may take up to 14 business days to receive a call for an appt.

## 2023-12-27 NOTE — Telephone Encounter (Signed)
Is there not a vascular neurologist in their office, she should call back their office and see if they have one there, if they do not have 1 in [place] and then I guess we would have to go to [facility] or [facility] for a vascular neurologist but I would have her call the [facility] neurology because I think they have somebody like that in their office

## 2023-12-27 NOTE — Telephone Encounter (Unsigned)
Copied from CRM [id number]. Topic: Referral - Status >> Dec 27, 2023  9:24 AM Gaetano Hawthorne wrote: Reason for CRM: Patient was calling back to provide the neurosurgeon information - she was to be referred to the [facility] on [address] - She wanted this information to be passed on [place], she was assisting the patient with a referral.

## 2023-12-30 NOTE — Telephone Encounter (Signed)
Per Referral Notes - Vascular Office in [place] does not treat intercranial disease, so Patient was sent to Neurosurgery and she was seen on 10/09/2023.

## 2023-12-30 NOTE — Telephone Encounter (Signed)
Please see previous telephone message.

## 2023-12-30 NOTE — Telephone Encounter (Signed)
Courtney,   When I placed the referral on Friday the pt was not sure of this information. Just making you aware. Thank you!

## 2023-12-31 ENCOUNTER — Telehealth: Payer: Self-pay | Admitting: Pharmacist

## 2023-12-31 DIAGNOSIS — N1831 Type 2 diabetes mellitus with diabetic chronic kidney disease: Secondary | ICD-10-CM

## 2023-12-31 MED ORDER — DAPAGLIFLOZIN PROPANEDIOL 10 MG PO TABS: 10.0000 mg | ORAL_TABLET | Freq: Every day | ORAL | 5 refills | Status: DC

## 2023-12-31 NOTE — Telephone Encounter (Signed)
   Patient enrolled in the AZ[employer]me patient assistance program for [place].  Updated RX escribed to [facility] mail order (pharmacy for [employer] patient assistance).  Patient is stable on current regimen.    Kieth Brightly, [name], BCACP, CPP Clinical Pharmacist, [facility]

## 2024-01-05 ENCOUNTER — Other Ambulatory Visit: Payer: Self-pay | Admitting: Family Medicine

## 2024-01-05 DIAGNOSIS — N1831 Chronic kidney disease, stage 3a: Secondary | ICD-10-CM

## 2024-01-05 DIAGNOSIS — B9689 Other specified bacterial agents as the cause of diseases classified elsewhere: Secondary | ICD-10-CM

## 2024-01-05 DIAGNOSIS — I10 Essential (primary) hypertension: Secondary | ICD-10-CM

## 2024-01-05 DIAGNOSIS — J439 Emphysema, unspecified: Secondary | ICD-10-CM

## 2024-01-06 NOTE — Telephone Encounter (Signed)
 Copied from CRM [id number]. Topic: Clinical - Prescription Issue >> Jan 06, 2024  1:02 PM Pattie Borders B wrote: Reason for CRM: Patient calling about tirzepatide  (MOUNJARO ) 10 MG/0.5ML Pen, says supposed to be 7.5mg  injections. Please call [phone]

## 2024-01-26 ENCOUNTER — Other Ambulatory Visit: Payer: Self-pay | Admitting: Family Medicine

## 2024-02-10 ENCOUNTER — Encounter: Payer: Self-pay | Admitting: Family Medicine

## 2024-02-10 ENCOUNTER — Ambulatory Visit (INDEPENDENT_AMBULATORY_CARE_PROVIDER_SITE_OTHER): Payer: Medicare PPO | Admitting: Family Medicine

## 2024-02-10 VITALS — BP 127/65 | HR 69 | Temp 98.4°F | Ht 62.0 in | Wt 161.0 lb

## 2024-02-10 DIAGNOSIS — E782 Mixed hyperlipidemia: Secondary | ICD-10-CM | POA: Diagnosis not present

## 2024-02-10 DIAGNOSIS — Z7984 Long term (current) use of oral hypoglycemic drugs: Secondary | ICD-10-CM | POA: Diagnosis not present

## 2024-02-10 DIAGNOSIS — I10 Essential (primary) hypertension: Secondary | ICD-10-CM

## 2024-02-10 DIAGNOSIS — N1831 Chronic kidney disease, stage 3a: Secondary | ICD-10-CM | POA: Diagnosis not present

## 2024-02-10 DIAGNOSIS — E1122 Type 2 diabetes mellitus with diabetic chronic kidney disease: Secondary | ICD-10-CM | POA: Diagnosis not present

## 2024-02-10 LAB — LIPID PANEL

## 2024-02-10 LAB — BAYER DCA HB A1C WAIVED: HB A1C (BAYER DCA - WAIVED): 5.9 % — ABNORMAL HIGH (ref 4.8–5.6)

## 2024-02-10 MED ORDER — TIRZEPATIDE 10 MG/0.5ML ~~LOC~~ SOAJ: 10.0000 mg | SUBCUTANEOUS | 3 refills | Status: DC

## 2024-02-10 MED ORDER — TIRZEPATIDE 7.5 MG/0.5ML ~~LOC~~ SOAJ: 7.5000 mg | SUBCUTANEOUS | 3 refills | Status: DC

## 2024-02-10 MED ORDER — ATORVASTATIN CALCIUM 80 MG PO TABS: 80.0000 mg | ORAL_TABLET | Freq: Every day | ORAL | 0 refills | Status: DC

## 2024-02-10 MED ORDER — NEXLIZET 180-10 MG PO TABS: 1.0000 | ORAL_TABLET | Freq: Every day | ORAL | 3 refills | Status: DC

## 2024-02-10 MED ORDER — AMLODIPINE BESYLATE 10 MG PO TABS: 10.0000 mg | ORAL_TABLET | Freq: Every day | ORAL | 0 refills | Status: DC

## 2024-02-10 NOTE — Progress Notes (Signed)
 Subjective:  Patient ID: Yvonne [name],  female    DOB: 26-Aug-1951  Age: 73 y.o.    CC: Medical Management of Chronic Issues (BP is up and down. Past two weeks. ) and Blood Sugar Problem (Goes up and down. Might be due to not having mounjaro. Breaks out in cold sweat when sugar is low. )   HPI Yvonne [name] presents for  follow-up of hypertension. Patient has no history of headache chest pain or shortness of breath or recent cough. Patient also denies symptoms of TIA such as numbness weakness lateralizing. Patient denies side effects from medication. States taking it regularly.  Patient also  in for follow-up of elevated cholesterol. Doing well without complaints on current medication. Denies side effects  including myalgia and arthralgia and nausea. Also in today for liver function testing. Currently no chest pain, shortness of breath or other cardiovascular related symptoms noted.  Follow-up of diabetes. Patient does check blood sugar at home. Readings run between 100 and 150 Patient denies symptoms such as excessive hunger or urinary frequency, excessive hunger, nausea No significant hypoglycemic spells noted. Medications reviewed. Pt reports taking them regularly. Pt. denies complication/adverse reaction today. Having to ues Mounjaro 7.5 weekly for the time being due to backorder of Mounjaro 10 mg.   History Yvonne [name] has a past medical history of (HFpEF) heart failure with preserved ejection fraction (HCC), Anxiety, Aortic atherosclerosis (HCC), Arthritis, Asthma, CAD (coronary artery disease), Carotid artery disease (HCC), Chronic cough, Chronic otitis media (12/2017), Full dentures, GERD (gastroesophageal reflux disease), History of stroke (09/2017), Hyperlipidemia, Hypertension, Non-insulin dependent type 2 diabetes mellitus (HCC), Orthostatic hypotension, Overactive bladder, PSVT (paroxysmal supraventricular tachycardia) (HCC), Recurrent acute suppurative otitis media without spontaneous  rupture of left tympanic membrane (02/19/2018), Stroke (HCC), and Transient cerebral ischemia.   She has a past surgical history that includes Cholecystectomy; Colonoscopy (N/A, 08/17/2015); Abdominal hysterectomy; Lacrimal duct exploration (Bilateral); Cataract extraction w/ intraocular lens implant (Right); Vitrectomy (Left, 09/14/2015); Gas insertion (Right); Myringotomy with tube placement (Bilateral, 01/28/2018); IR ANGIO INTRA EXTRACRAN SEL INTERNAL CAROTID BILAT MOD SED (10/22/2023); and IR ANGIO VERTEBRAL SEL VERTEBRAL UNI L MOD SED (10/22/2023).   Her family history includes Arthritis in her brother and mother; Arthritis-Osteo in her son; Asthma in her father; Breast cancer in her mother; CVA in her mother; Cancer in her brother; Diabetes in her father and mother; Heart disease in her father; Hepatitis C in her sister; Peripheral Artery Disease in her daughter; Stroke in her mother.She reports that she has never smoked. She has never used smokeless tobacco. She reports that she does not currently use alcohol. She reports that she does not use drugs.  Current Outpatient Medications on File Prior to Visit  Medication Sig Dispense Refill   ACCU-CHEK GUIDE TEST test strip TEST BLOOD SUGAR 4 TIMES DAILY DX E11.69 400 strip 3   Accu-Chek Softclix Lancets lancets Test BS up to 4 times daily Dx E11.69 400 each 3   albuterol (VENTOLIN HFA) 108 (90 Base) MCG/ACT inhaler INHALE 2 PUFFS INTO THE LUNGS EVERY 6 HOURS AS NEEDED FOR WHEEZE OR SHORTNESS OF BREATH 54 each 0   alendronate (FOSAMAX) 70 MG tablet Take 1 tablet (70 mg total) by mouth once a week. Take with a full glass of water on an empty stomach. 12 tablet 3   ALPRAZolam (XANAX) 1 MG tablet ! Each morning, one each evening, and 1/2 each afternoon 75 tablet 5   apixaban (ELIQUIS) 5 MG TABS tablet Take 1 tablet (5  mg total) by mouth 2 (two) times daily. 180 tablet 3   Blood Glucose Monitoring Suppl (ACCU-CHEK GUIDE) w/Device KIT Test Bs QID Dx E11.69  1 kit 0   conjugated estrogens (PREMARIN) vaginal cream Use 0.5 gm in vagina nightly for 1 week then 2 x weekly 12 g 0   cyclobenzaprine (FLEXERIL) 10 MG tablet Take 1 tablet (10 mg total) by mouth 3 (three) times daily as needed for muscle spasms. 30 tablet 0   dapagliflozin propanediol (FARXIGA) 10 MG TABS tablet Take 1 tablet (10 mg total) by mouth daily. 90 tablet 5   diclofenac Sodium (VOLTAREN) 1 % GEL Apply 2 g topically 4 (four) times daily. 350 g 0   famotidine (PEPCID) 20 MG tablet Take 1 tablet (20 mg total) by mouth at bedtime. 90 tablet 3   furosemide (LASIX) 20 MG tablet Take 1 tablet (20 mg total) by mouth daily. 90 tablet 1   oxybutynin (DITROPAN-XL) 5 MG 24 hr tablet Take 1 tablet (5 mg total) by mouth at bedtime. 90 tablet 3   pantoprazole (PROTONIX) 40 MG tablet Take 1 tablet (40 mg total) by mouth daily. For stomach 30 tablet 11   Current Facility-Administered Medications on File Prior to Visit  Medication Dose Route Frequency Provider Last Rate Last Admin   promethazine (PHENERGAN) injection 25 mg  25 mg Intramuscular Q6H PRN Daphine Deutscher, Mary-Margaret, FNP   25 mg at 12/28/22 1316    ROS Review of Systems  Constitutional: Negative.   HENT: Negative.    Eyes:  Negative for visual disturbance.  Respiratory:  Negative for shortness of breath.   Cardiovascular:  Negative for chest pain.  Gastrointestinal:  Negative for abdominal pain.  Musculoskeletal:  Negative for arthralgias.    Objective:  BP 127/65   Pulse 69   Temp 98.4 F (36.9 C)   Ht 5\' 2"  (1.575 m)   Wt 161 lb (73 kg)   SpO2 98%   BMI 29.45 kg/m   BP Readings from Last 3 Encounters:  02/10/24 127/65  12/06/23 (!) 142/80  11/11/23 129/66    Wt Readings from Last 3 Encounters:  02/10/24 161 lb (73 kg)  11/11/23 157 lb 6.4 oz (71.4 kg)  11/04/23 161 lb (73 kg)     Physical Exam Constitutional:      General: She is not in acute distress.    Appearance: She is well-developed.  Cardiovascular:      Rate and Rhythm: Normal rate and regular rhythm.  Pulmonary:     Breath sounds: Normal breath sounds.  Musculoskeletal:        General: Normal range of motion.  Skin:    General: Skin is warm and dry.  Neurological:     Mental Status: She is alert and oriented to person, place, and time.     Diabetic Foot Exam - Simple   No data filed     Lab Results  Component Value Date   HGBA1C 6.3 (H) 11/11/2023   HGBA1C 6.0 (H) 08/12/2023   HGBA1C 5.8 (H) 05/09/2023    Assessment & Plan:   Rutha was seen today for medical management of chronic issues and blood sugar problem.  Diagnoses and all orders for this visit:  Type 2 diabetes mellitus with stage 3a chronic kidney disease, without long-term current use of insulin (HCC) -     Bayer DCA Hb A1c Waived  Mixed hyperlipidemia -     Lipid panel  Primary hypertension -     CBC with  Differential/Platelet -     CMP14+EGFR  Essential hypertension -     amLODipine (NORVASC) 10 MG tablet; Take 1 tablet (10 mg total) by mouth daily.  Other orders -     atorvastatin (LIPITOR) 80 MG tablet; Take 1 tablet (80 mg total) by mouth daily. -     Bempedoic Acid-Ezetimibe (NEXLIZET) 180-10 MG TABS; Take 1 tablet by mouth daily. Take 180 mg by mouth daily. -     tirzepatide (MOUNJARO) 10 MG/0.5ML Pen; Inject 10 mg into the skin once a week. -     tirzepatide (MOUNJARO) 7.5 MG/0.5ML Pen; Inject 7.5 mg into the skin once a week. Use when 10 is unavailable due to back order   I have discontinued Lawernce Keas. Topp's clopidogrel, moxifloxacin, and glipiZIDE. I have also changed her amLODipine and atorvastatin. Additionally, I am having her start on tirzepatide. Lastly, I am having her maintain her Accu-Chek Softclix Lancets, Accu-Chek Guide, Premarin, alendronate, oxybutynin, famotidine, ALPRAZolam, furosemide, pantoprazole, apixaban, diclofenac Sodium, cyclobenzaprine, dapagliflozin propanediol, albuterol, Accu-Chek Guide Test, Nexlizet, and  tirzepatide. We will continue to administer promethazine.  Meds ordered this encounter  Medications   amLODipine (NORVASC) 10 MG tablet    Sig: Take 1 tablet (10 mg total) by mouth daily.    Dispense:  90 tablet    Refill:  0   atorvastatin (LIPITOR) 80 MG tablet    Sig: Take 1 tablet (80 mg total) by mouth daily.    Dispense:  90 tablet    Refill:  0   Bempedoic Acid-Ezetimibe (NEXLIZET) 180-10 MG TABS    Sig: Take 1 tablet by mouth daily. Take 180 mg by mouth daily.    Dispense:  90 tablet    Refill:  3   tirzepatide (MOUNJARO) 10 MG/0.5ML Pen    Sig: Inject 10 mg into the skin once a week.    Dispense:  6 mL    Refill:  3   tirzepatide (MOUNJARO) 7.5 MG/0.5ML Pen    Sig: Inject 7.5 mg into the skin once a week. Use when 10 is unavailable due to back order    Dispense:  6 mL    Refill:  3  Continue to use the 7.5 Mounjaro and she will the 10 becomes available.  When the 10 becomes available she can also drop the glipizide.   Follow-up: Return in about 3 months (around 05/12/2024).  Mechele Claude, M.D.

## 2024-02-11 ENCOUNTER — Encounter: Payer: Self-pay | Admitting: Family Medicine

## 2024-02-11 LAB — CBC WITH DIFFERENTIAL/PLATELET
Basophils Absolute: 0 10*3/uL (ref 0.0–0.2)
Basos: 1 %
EOS (ABSOLUTE): 0.1 10*3/uL (ref 0.0–0.4)
Eos: 1 %
Hematocrit: 39 % (ref 34.0–46.6)
Hemoglobin: 12.9 g/dL (ref 11.1–15.9)
Immature Grans (Abs): 0 10*3/uL (ref 0.0–0.1)
Immature Granulocytes: 0 %
Lymphocytes Absolute: 2.2 10*3/uL (ref 0.7–3.1)
Lymphs: 45 %
MCH: 29.1 pg (ref 26.6–33.0)
MCHC: 33.1 g/dL (ref 31.5–35.7)
MCV: 88 fL (ref 79–97)
Monocytes Absolute: 0.3 10*3/uL (ref 0.1–0.9)
Monocytes: 7 %
Neutrophils Absolute: 2.2 10*3/uL (ref 1.4–7.0)
Neutrophils: 46 %
Platelets: 249 10*3/uL (ref 150–450)
RBC: 4.43 x10E6/uL (ref 3.77–5.28)
RDW: 12.9 % (ref 11.7–15.4)
WBC: 4.7 10*3/uL (ref 3.4–10.8)

## 2024-02-11 LAB — CMP14+EGFR
ALT: 9 IU/L (ref 0–32)
AST: 12 IU/L (ref 0–40)
Albumin: 3.9 g/dL (ref 3.8–4.8)
Alkaline Phosphatase: 95 IU/L (ref 44–121)
BUN/Creatinine Ratio: 21 (ref 12–28)
BUN: 21 mg/dL (ref 8–27)
Bilirubin Total: 0.4 mg/dL (ref 0.0–1.2)
CO2: 25 mmol/L (ref 20–29)
Calcium: 9.8 mg/dL (ref 8.7–10.3)
Chloride: 105 mmol/L (ref 96–106)
Creatinine, Ser: 0.99 mg/dL (ref 0.57–1.00)
Globulin, Total: 2.1 g/dL (ref 1.5–4.5)
Glucose: 88 mg/dL (ref 70–99)
Potassium: 3.8 mmol/L (ref 3.5–5.2)
Sodium: 142 mmol/L (ref 134–144)
Total Protein: 6 g/dL (ref 6.0–8.5)
eGFR: 60 mL/min/{1.73_m2} (ref >59)

## 2024-02-11 LAB — LIPID PANEL
Cholesterol, Total: 173 mg/dL (ref 100–199)
HDL: 60 mg/dL (ref >39)
LDL CALC COMMENT:: 2.9 ratio (ref 0.0–4.4)
LDL Chol Calc (NIH): 98 mg/dL (ref 0–99)
Triglycerides: 79 mg/dL (ref 0–149)
VLDL Cholesterol Cal: 15 mg/dL (ref 5–40)

## 2024-02-11 NOTE — Progress Notes (Signed)
Hello Maridel,  Your lab result is normal and/or stable.Some minor variations that are not significant are commonly marked abnormal, but do not represent any medical problem for you.  Best regards, Kaci Dillie, M.D.

## 2024-02-25 ENCOUNTER — Ambulatory Visit (INDEPENDENT_AMBULATORY_CARE_PROVIDER_SITE_OTHER): Admitting: Family Medicine

## 2024-02-25 ENCOUNTER — Encounter: Payer: Self-pay | Admitting: Family Medicine

## 2024-02-25 VITALS — BP 135/73 | HR 67 | Temp 98.2°F | Ht 62.0 in | Wt 161.0 lb

## 2024-02-25 DIAGNOSIS — J0110 Acute frontal sinusitis, unspecified: Secondary | ICD-10-CM

## 2024-02-25 DIAGNOSIS — F411 Generalized anxiety disorder: Secondary | ICD-10-CM

## 2024-02-25 DIAGNOSIS — R051 Acute cough: Secondary | ICD-10-CM | POA: Diagnosis not present

## 2024-02-25 DIAGNOSIS — F322 Major depressive disorder, single episode, severe without psychotic features: Secondary | ICD-10-CM | POA: Diagnosis not present

## 2024-02-25 MED ORDER — DOXYCYCLINE HYCLATE 100 MG PO TABS: 100.0000 mg | ORAL_TABLET | Freq: Two times a day (BID) | ORAL | 0 refills | Status: AC

## 2024-02-25 MED ORDER — PROMETHAZINE-DM 6.25-15 MG/5ML PO SYRP: 1.2500 mL | ORAL_SOLUTION | Freq: Four times a day (QID) | ORAL | 0 refills | Status: DC | PRN

## 2024-02-25 NOTE — Progress Notes (Signed)
 Subjective:  Patient ID: Yvonne [name], female    DOB: [birth year], 73 y.o.   MRN: [id number]  Patient Care Team: Mechele Claude, MD as PCP - General (Family Medicine) Drema Dallas, DO as Consulting Physician (Neurology) Randa Spike Kelton Pillar, [name] as Social Worker (Licensed Clinical Social Worker) Cresenciano Genre, Lilla Shook, [facility] (Pharmacist) Christell Constant, MD as Consulting Physician (Cardiology) Michaelle Copas, MD as Referring Physician (Optometry)   Chief Complaint:  cough, congestion, headache (X 1 week)   HPI: Yvonne [name] is a 73 y.o. female presenting on 02/25/2024 for cough, congestion, headache (X 1 week) States that symptoms started 1 week ago. Believed symptoms were due to allergies at first, continued to worsen. Reports headache, sore throat, cough, hoarse voice, sinus pressure, chest tightness. Sputum production with yellow sputum. Denies fever, N/V. Taking Claritin. Not helping. Denies history of COPD  Relevant past medical, surgical, family, and social history reviewed and updated as indicated.  Allergies and medications reviewed and updated. Data reviewed: Chart in Epic.   Past Medical History:  Diagnosis Date   (HFpEF) heart failure with preserved ejection fraction (HCC)    Anxiety    Aortic atherosclerosis (HCC)    Arthritis    left hand   Asthma    daily and prn inhalers   CAD (coronary artery disease)    Carotid artery disease (HCC)    Chronic cough    Chronic otitis media 12/2017   Full dentures    GERD (gastroesophageal reflux disease)    History of stroke 09/2017   weakness right hand, numbness right side face   Hyperlipidemia    Hypertension    states under control with meds., has been on med. x a long time, per pt.   Non-insulin dependent type 2 diabetes mellitus (HCC)    Orthostatic hypotension    Overactive bladder    PSVT (paroxysmal supraventricular tachycardia) (HCC)    Recurrent acute suppurative otitis media without spontaneous rupture of  left tympanic membrane 02/19/2018   Stroke (HCC)    Transient cerebral ischemia     Past Surgical History:  Procedure Laterality Date   ABDOMINAL HYSTERECTOMY     complete   CATARACT EXTRACTION W/ INTRAOCULAR LENS IMPLANT Right    CHOLECYSTECTOMY     COLONOSCOPY N/A 08/17/2015   Procedure: COLONOSCOPY;  Surgeon: Malissa Hippo, MD;  Location: [facility];  Service: Endoscopy;  Laterality: N/A;  200 - moved to 7:30 - Ann notified pt   GAS INSERTION Right    x 2 - eye   IR ANGIO INTRA EXTRACRAN SEL INTERNAL CAROTID BILAT MOD SED  10/22/2023   IR ANGIO VERTEBRAL SEL VERTEBRAL UNI L MOD SED  10/22/2023   LACRIMAL DUCT EXPLORATION Bilateral    removal of tear ducts   MYRINGOTOMY WITH TUBE PLACEMENT Bilateral 01/28/2018   Procedure: BILATERAL MYRINGOTOMY WITH TUBE PLACEMENT;  Surgeon: Newman Pies, MD;  Location: [facility];  Service: ENT;  Laterality: Bilateral;   VITRECTOMY Left 09/14/2015    Social History   Socioeconomic History   Marital status: Married    Spouse name: Not on file   Number of children: 3   Years of education: Not on file   Highest education level: GED or equivalent  Occupational History   Occupation: retired    Comment: Producer, television/film/video and restaurants  Tobacco Use   Smoking status: Never   Smokeless tobacco: Never  Vaping Use   Vaping status: Never Used  Substance and Sexual Activity   Alcohol use: Not Currently   Drug use: No   Sexual activity: Yes    Birth control/protection: Surgical    Comment: hyst  Other Topics Concern   Not on file  Social History Narrative   Lives at home home with husband with    Son, daughter in law and 3 children.  Lucila Maine, his girlfriend and her child also moved in 10/2018      Disabled.   Social Drivers of Corporate investment banker Strain: Low Risk  (12/05/2023)   Overall Financial Resource Strain (CARDIA)    Difficulty of Paying Living Expenses: Not very hard  Food Insecurity: No Food Insecurity  (12/05/2023)   Hunger Vital Sign    Worried About Running Out of Food in the Last Year: Never true    Ran Out of Food in the Last Year: Never true  Transportation Needs: No Transportation Needs (12/05/2023)   PRAPARE - Administrator, Civil Service (Medical): No    Lack of Transportation (Non-Medical): No  Physical Activity: Sufficiently Active (12/05/2023)   Exercise Vital Sign    Days of Exercise per Week: 4 days    Minutes of Exercise per Session: 60 min  Recent Concern: Physical Activity - Insufficiently Active (11/04/2023)   Exercise Vital Sign    Days of Exercise per Week: 7 days    Minutes of Exercise per Session: 20 min  Stress: Stress Concern Present (12/05/2023)   [employer] of Occupational Health - Occupational Stress Questionnaire    Feeling of Stress : Very much  Social Connections: Unknown (12/05/2023)   Social Connection and Isolation Panel [NHANES]    Frequency of Communication with Friends and Family: More than three times a week    Frequency of Social Gatherings with Friends and Family: Once a week    Attends Religious Services: Patient declined    Database administrator or Organizations: No    Attends Engineer, structural: More than 4 times per year    Marital Status: Married  Catering manager Violence: Not At Risk (03/05/2023)   Humiliation, Afraid, Rape, and Kick questionnaire    Fear of Current or Ex-Partner: No    Emotionally Abused: No    Physically Abused: No    Sexually Abused: No    Outpatient Encounter Medications as of 02/25/2024  Medication Sig   ACCU-CHEK GUIDE TEST test strip TEST BLOOD SUGAR 4 TIMES DAILY DX E11.69   Accu-Chek Softclix Lancets lancets Test BS up to 4 times daily Dx E11.69   albuterol (VENTOLIN HFA) 108 (90 Base) MCG/ACT inhaler INHALE 2 PUFFS INTO THE LUNGS EVERY 6 HOURS AS NEEDED FOR WHEEZE OR SHORTNESS OF BREATH   alendronate (FOSAMAX) 70 MG tablet Take 1 tablet (70 mg total) by mouth once a week. Take with a full  glass of water on an empty stomach.   ALPRAZolam (XANAX) 1 MG tablet ! Each morning, one each evening, and 1/2 each afternoon   amLODipine (NORVASC) 10 MG tablet Take 1 tablet (10 mg total) by mouth daily.   apixaban (ELIQUIS) 5 MG TABS tablet Take 1 tablet (5 mg total) by mouth 2 (two) times daily.   atorvastatin (LIPITOR) 80 MG tablet Take 1 tablet (80 mg total) by mouth daily.   Bempedoic Acid-Ezetimibe (NEXLIZET) 180-10 MG TABS Take 1 tablet by mouth daily. Take 180 mg by mouth daily.   Blood Glucose Monitoring Suppl (ACCU-CHEK GUIDE) w/Device KIT Test Bs QID Dx E11.69  conjugated estrogens (PREMARIN) vaginal cream Use 0.5 gm in vagina nightly for 1 week then 2 x weekly   cyclobenzaprine (FLEXERIL) 10 MG tablet Take 1 tablet (10 mg total) by mouth 3 (three) times daily as needed for muscle spasms.   dapagliflozin propanediol (FARXIGA) 10 MG TABS tablet Take 1 tablet (10 mg total) by mouth daily.   diclofenac Sodium (VOLTAREN) 1 % GEL Apply 2 g topically 4 (four) times daily.   famotidine (PEPCID) 20 MG tablet Take 1 tablet (20 mg total) by mouth at bedtime.   furosemide (LASIX) 20 MG tablet Take 1 tablet (20 mg total) by mouth daily.   oxybutynin (DITROPAN-XL) 5 MG 24 hr tablet Take 1 tablet (5 mg total) by mouth at bedtime.   pantoprazole (PROTONIX) 40 MG tablet Take 1 tablet (40 mg total) by mouth daily. For stomach   tirzepatide (MOUNJARO) 10 MG/0.5ML Pen Inject 10 mg into the skin once a week.   tirzepatide (MOUNJARO) 7.5 MG/0.5ML Pen Inject 7.5 mg into the skin once a week. Use when 10 is unavailable due to back order   Facility-Administered Encounter Medications as of 02/25/2024  Medication   promethazine (PHENERGAN) injection 25 mg    Allergies  Allergen Reactions   Penicillins Hives and Itching    Has patient had a PCN reaction causing immediate rash, facial/tongue/throat swelling, SOB or lightheadedness with hypotension: Yes Has patient had a PCN reaction causing severe rash  involving mucus membranes or skin necrosis: Unk Has patient had a PCN reaction that required hospitalization: No Has patient had a PCN reaction occurring within the last 10 years: No If all of the above answers are "NO", then may proceed with Cephalosporin use.  Has patient had a PCN reaction causing immediate rash, facial/tongue/throat swelling, SOB or lightheadedness with hypotension: Yes Has patient had a PCN reaction causing severe rash involving mucus membranes or skin necrosis: No Has patient had a PCN reaction that required hospitalization No Has patient had a PCN reaction occurring within the last 10 years: No If all of the above answers are "NO", then may proceed with Cephalosporin use. Has patient had a PCN reaction causing immediate rash, facial/tongue/throat swelling, SOB or lightheadedness with hypotension: Yes Has patient had a PCN reaction causing severe rash involving mucus membranes or skin necrosis: Unk Has patient had a PCN reaction that required hospitalization: No Has patient had a PCN reaction occurring within the last 10 years: No If all of the above answers are "NO", then may proceed with Cephalosporin use. Has patient had a PCN reaction causing immediate rash, facial/tongue/throat swelling, SOB or lightheadedness with hypotension: Yes Has patient had a PCN reaction causing severe rash involving mucus membranes or skin necrosis: No Has patient had a PCN reaction that required hospitalization No Has patient had a PCN reaction occurring within the last 10 years: No If all of the above answers are "NO", then may proceed with Cephalosporin use. Has patient had a PCN reaction causing immediate rash, facial/tongue/throat swelling, SOB or lightheadedness with hypotension: Yes Has patient had a PCN reaction causing sev... (TRUNCATED)   Metformin And Related Diarrhea    dizziness   Gabapentin Other (See Comments)    Causes a lot of lethargy at a higher dose Causes a lot of  lethargy at a higher dose Causes a lot of lethargy at a higher dose   Lisinopril Cough    HAS A CHRONIC COUGH AND DID NOT NOTICE IMPROVEMENT OF COUGH WHEN LISINOPRIL WAS STOPPED HAS A CHRONIC  COUGH AND DID NOT NOTICE IMPROVEMENT OF COUGH WHEN LISINOPRIL WAS STOPPED HAS A CHRONIC COUGH AND DID NOT NOTICE IMPROVEMENT OF COUGH WHEN LISINOPRIL WAS STOPPED   Soap Rash    DIAL soap    Review of Systems As per HPI  Objective:  BP 135/73   Pulse 67   Temp 98.2 F (36.8 C)   Ht 5\' 2"  (1.575 m)   Wt 161 lb (73 kg)   SpO2 98%   BMI 29.45 kg/m    Wt Readings from Last 3 Encounters:  02/25/24 161 lb (73 kg)  02/10/24 161 lb (73 kg)  11/11/23 157 lb 6.4 oz (71.4 kg)    Physical Exam Constitutional:      General: She is awake. She is not in acute distress.    Appearance: Normal appearance. She is well-developed and well-groomed. She is ill-appearing. She is not toxic-appearing or diaphoretic.  HENT:     Right Ear: No drainage, swelling or tenderness. A middle ear effusion is present. There is no impacted cerumen. No foreign body. No mastoid tenderness. No PE tube. No hemotympanum. Tympanic membrane is scarred. Tympanic membrane is not injected, perforated, erythematous, retracted or bulging.     Left Ear: No drainage, swelling or tenderness. A middle ear effusion is present. There is no impacted cerumen. No foreign body. No mastoid tenderness. No PE tube. No hemotympanum. Tympanic membrane is scarred. Tympanic membrane is not injected, perforated, erythematous, retracted or bulging.     Nose: Congestion and rhinorrhea present. Rhinorrhea is clear.     Right Sinus: Maxillary sinus tenderness and frontal sinus tenderness present.     Left Sinus: Maxillary sinus tenderness and frontal sinus tenderness present.     Mouth/Throat:     Lips: Pink. No lesions.     Pharynx: Posterior oropharyngeal erythema present. No pharyngeal swelling, oropharyngeal exudate, uvula swelling or postnasal drip.      Tonsils: No tonsillar exudate or tonsillar abscesses. 1+ on the right. 1+ on the left.  Eyes:     Conjunctiva/sclera:     Right eye: Right conjunctiva is injected.     Left eye: Left conjunctiva is injected.     Comments: Watery   Cardiovascular:     Rate and Rhythm: Normal rate and regular rhythm.     Heart sounds: Normal heart sounds.  Pulmonary:     Effort: Pulmonary effort is normal.     Breath sounds: Decreased air movement present. No stridor or transmitted upper airway sounds. Examination of the left-middle field reveals decreased breath sounds. Examination of the left-lower field reveals decreased breath sounds. Decreased breath sounds present. No wheezing, rhonchi or rales.  Lymphadenopathy:     Head:     Right side of head: No submental, submandibular, tonsillar, preauricular or posterior auricular adenopathy.     Left side of head: No submental, submandibular, tonsillar, preauricular or posterior auricular adenopathy.     Cervical:     Right cervical: No superficial cervical adenopathy.    Left cervical: No superficial cervical adenopathy.  Skin:    General: Skin is warm.     Capillary Refill: Capillary refill takes less than 2 seconds.  Neurological:     General: No focal deficit present.     Mental Status: She is alert, oriented to person, place, and time and easily aroused.  Psychiatric:        Attention and Perception: Attention and perception normal.        Mood and Affect: Mood and affect normal.  Speech: Speech normal.        Behavior: Behavior normal. Behavior is cooperative.        Thought Content: Thought content normal.        Cognition and Memory: Cognition and memory normal.        Judgment: Judgment normal.     Results for orders placed or performed in visit on 02/10/24  Bayer DCA Hb A1c Waived   Collection Time: 02/10/24  9:06 AM  Result Value Ref Range   HB A1C (BAYER DCA - WAIVED) 5.9 (H) 4.8 - 5.6 %  CBC with Differential/Platelet    Collection Time: 02/10/24  9:10 AM  Result Value Ref Range   WBC 4.7 3.4 - 10.8 x10E3/uL   RBC 4.43 3.77 - 5.28 x10E6/uL   Hemoglobin 12.9 11.1 - 15.9 g/dL   Hematocrit 16.1 09.6 - 46.6 %   MCV 88 79 - 97 fL   MCH 29.1 26.6 - 33.0 pg   MCHC 33.1 31.5 - 35.7 g/dL   RDW 04.5 40.9 - 81.1 %   Platelets 249 150 - 450 x10E3/uL   Neutrophils 46 Not Estab. %   Lymphs 45 Not Estab. %   Monocytes 7 Not Estab. %   Eos 1 Not Estab. %   Basos 1 Not Estab. %   Neutrophils Absolute 2.2 1.4 - 7.0 x10E3/uL   Lymphocytes Absolute 2.2 0.7 - 3.1 x10E3/uL   Monocytes Absolute 0.3 0.1 - 0.9 x10E3/uL   EOS (ABSOLUTE) 0.1 0.0 - 0.4 x10E3/uL   Basophils Absolute 0.0 0.0 - 0.2 x10E3/uL   Immature Granulocytes 0 Not Estab. %   Immature Grans (Abs) 0.0 0.0 - 0.1 x10E3/uL  CMP14+EGFR   Collection Time: 02/10/24  9:10 AM  Result Value Ref Range   Glucose 88 70 - 99 mg/dL   BUN 21 8 - 27 mg/dL   Creatinine, Ser 9.14 0.57 - 1.00 mg/dL   eGFR 60 >78 GN/FAO/1.30   BUN/Creatinine Ratio 21 12 - 28   Sodium 142 134 - 144 mmol/L   Potassium 3.8 3.5 - 5.2 mmol/L   Chloride 105 96 - 106 mmol/L   CO2 25 20 - 29 mmol/L   Calcium 9.8 8.7 - 10.3 mg/dL   Total Protein 6.0 6.0 - 8.5 g/dL   Albumin 3.9 3.8 - 4.8 g/dL   Globulin, Total 2.1 1.5 - 4.5 g/dL   Bilirubin Total 0.4 0.0 - 1.2 mg/dL   Alkaline Phosphatase 95 44 - 121 IU/L   AST 12 0 - 40 IU/L   ALT 9 0 - 32 IU/L  Lipid panel   Collection Time: 02/10/24  9:10 AM  Result Value Ref Range   Cholesterol, Total 173 100 - 199 mg/dL   Triglycerides 79 0 - 149 mg/dL   HDL 60 >86 mg/dL   VLDL Cholesterol Cal 15 5 - 40 mg/dL   LDL Chol Calc (NIH) 98 0 - 99 mg/dL   Chol/HDL Ratio 2.9 0.0 - 4.4 ratio       02/25/2024    8:14 AM 02/10/2024    9:05 AM 12/06/2023    8:20 AM 11/11/2023   10:00 AM 11/11/2023    9:55 AM  Depression screen PHQ 2/9  Decreased Interest 3 2 2 2  0  Down, Depressed, Hopeless 2 3 3 3  0  PHQ - 2 Score 5 5 5 5  0  Altered sleeping 3 3 3 3     Tired, decreased energy 2 2 2 2    Change in appetite 3  2 3 3    Feeling bad or failure about yourself  3 3 3 3    Trouble concentrating 2 2 2 2    Moving slowly or fidgety/restless 3 3 2 2    Suicidal thoughts 0 0 0 0   PHQ-9 Score 21 20 20 20    Difficult doing work/chores  Somewhat difficult Somewhat difficult Somewhat difficult        02/10/2024    9:06 AM 12/06/2023    8:24 AM 11/11/2023   10:01 AM 11/04/2023   12:34 PM  GAD 7 : Generalized Anxiety Score  Nervous, Anxious, on Edge 3 2 2 3   Control/stop worrying 3 3 3 3   Worry too much - different things 3 3 3 3   Trouble relaxing 3 2 2 2   Restless 2 2 2 2   Easily annoyed or irritable 3 3 3 3   Afraid - awful might happen 3 3 3 3   Total GAD 7 Score 20 18 18 19   Anxiety Difficulty Not difficult at all Somewhat difficult Somewhat difficult Somewhat difficult   Pertinent labs & imaging results that were available during my care of the patient were reviewed by me and considered in my medical decision making.  Assessment & Plan:  Sherly was seen today for cough, congestion, headache.  Diagnoses and all orders for this visit: 1. Acute non-recurrent frontal sinusitis (Primary) Discussed with patient that will start medication as below. Discussed at home care such as humidifier, saline spray, throat lozenges, increasing hydration, and tylenol for pain or fever.  - doxycycline (VIBRA-TABS) 100 MG tablet; Take 1 tablet (100 mg total) by mouth 2 (two) times daily for 7 days.  Dispense: 14 tablet; Refill: 0 - promethazine-dextromethorphan (PROMETHAZINE-DM) 6.25-15 MG/5ML syrup; Take 1.3 mLs by mouth 4 (four) times daily as needed.  Dispense: 118 mL; Refill: 0  2. Acute cough As above  - doxycycline (VIBRA-TABS) 100 MG tablet; Take 1 tablet (100 mg total) by mouth 2 (two) times daily for 7 days.  Dispense: 14 tablet; Refill: 0 - promethazine-dextromethorphan (PROMETHAZINE-DM) 6.25-15 MG/5ML syrup; Take 1.3 mLs by mouth 4 (four) times daily as  needed.  Dispense: 118 mL; Refill: 0  3. Depression, major, single episode, severe (HCC) Elevated screening scores today in office. Patient denies SI. Safety contract established. Plans to follow up with PCP   4. GAD (generalized anxiety disorder) As above.      Continue all other maintenance medications.  Follow up plan: Return if symptoms worsen or fail to improve.   Continue healthy lifestyle choices, including diet (rich in fruits, vegetables, and lean proteins, and low in salt and simple carbohydrates) and exercise (at least 30 minutes of moderate physical activity daily).  Written and verbal instructions provided   The above assessment and management plan was discussed with the patient. The patient verbalized understanding of and has agreed to the management plan. Patient is aware to call the clinic if they develop any new symptoms or if symptoms persist or worsen. Patient is aware when to return to the clinic for a follow-up visit. Patient educated on when it is appropriate to go to the emergency department.   Neale Burly, DNP-FNP [facility] [address] [place], [place] [address] [phone]

## 2024-03-05 ENCOUNTER — Other Ambulatory Visit: Payer: Self-pay | Admitting: Family Medicine

## 2024-03-05 ENCOUNTER — Ambulatory Visit: Payer: Medicare PPO

## 2024-03-05 VITALS — BP 135/73 | HR 67 | Wt 161.0 lb

## 2024-03-05 DIAGNOSIS — Z0000 Encounter for general adult medical examination without abnormal findings: Secondary | ICD-10-CM | POA: Diagnosis not present

## 2024-03-05 DIAGNOSIS — J439 Emphysema, unspecified: Secondary | ICD-10-CM

## 2024-03-05 NOTE — Patient Instructions (Signed)
 Yvonne [name] , Thank you for taking time to come for your Medicare Wellness Visit. I appreciate your ongoing commitment to your health goals. Please review the following plan we discussed and let me know if I can assist you in the future.   Referrals/Orders/Follow-Ups/Clinician Recommendations: n/a  This is a list of the screening recommended for you and due dates:  Health Maintenance  Topic Date Due   COVID-19 Vaccine (3 - Moderna risk series) 03/21/2025*   Flu Shot  06/26/2024   Mammogram  06/30/2024   Complete foot exam   08/11/2024   Hemoglobin A1C  08/12/2024   Eye exam for diabetics  09/30/2024   Yearly kidney health urinalysis for diabetes  11/10/2024   Yearly kidney function blood test for diabetes  02/09/2025   Medicare Annual Wellness Visit  03/05/2025   DEXA scan (bone density measurement)  05/09/2025   Colon Cancer Screening  12/21/2029   DTaP/Tdap/Td vaccine (3 - Td or Tdap) 05/23/2031   Pneumonia Vaccine  Completed   Hepatitis C Screening  Completed   Zoster (Shingles) Vaccine  Completed   HPV Vaccine  Aged Out   Meningitis B Vaccine  Aged Out   Stool Blood Test  Discontinued  *Topic was postponed. The date shown is not the original due date.    Advanced directives: (Declined) Advance directive discussed with you today. Even though you declined this today, please call our office should you change your mind, and we can give you the proper paperwork for you to fill out.  Next Medicare Annual Wellness Visit scheduled for next year: Yes

## 2024-03-05 NOTE — Progress Notes (Signed)
 Subjective:   Yvonne [name] is a 73 y.o. who presents for a Medicare Wellness preventive visit.  Visit Complete: Virtual I connected with  Yvonne [name] on 03/05/24 by a audio enabled telemedicine application and verified that I am speaking with the correct person using two identifiers.  Patient Location: Home  Provider Location: Home Office  I discussed the limitations of evaluation and management by telemedicine. The patient expressed understanding and agreed to proceed.  Vital Signs: Because this visit was a virtual/telehealth visit, some criteria may be missing or patient reported. Any vitals not documented were not able to be obtained and vitals that have been documented are patient reported.  VideoDeclined- This patient declined Librarian, academic. Therefore the visit was completed with audio only.  Persons Participating in Visit: Patient.  AWV Questionnaire: Yes: Patient Medicare AWV questionnaire was completed by the patient on 03/04/24; I have confirmed that all information answered by patient is correct and no changes since this date.  Cardiac Risk Factors include: advanced age (>73men, >10 women);diabetes mellitus;dyslipidemia;hypertension;obesity (BMI >30kg/m2);Other (see comment), Risk factor comments: (HFpEF) heart failure with preserved ejectio     Objective:    Today's Vitals   03/05/24 1224  BP: 135/73  Pulse: 67  Weight: 161 lb (73 kg)   Body mass index is 29.45 kg/m.     03/05/2024   12:26 PM 10/22/2023    8:03 AM 04/12/2023    5:35 PM 03/05/2023   11:18 AM 03/01/2022   11:30 AM 02/01/2022    9:08 AM 01/23/2022    2:57 PM  Advanced Directives  Does Patient Have a Medical Advance Directive? No No No No No No No  Would patient like information on creating a medical advance directive?  No - Patient declined No - Patient declined No - Patient declined No - Patient declined  No - Patient declined    Current Medications  (verified) Outpatient Encounter Medications as of 03/05/2024  Medication Sig   ACCU-CHEK GUIDE TEST test strip TEST BLOOD SUGAR 4 TIMES DAILY DX E11.69   Accu-Chek Softclix Lancets lancets Test BS up to 4 times daily Dx E11.69   albuterol (VENTOLIN HFA) 108 (90 Base) MCG/ACT inhaler INHALE 2 PUFFS INTO THE LUNGS EVERY 6 HOURS AS NEEDED FOR WHEEZE OR SHORTNESS OF BREATH   alendronate (FOSAMAX) 70 MG tablet Take 1 tablet (70 mg total) by mouth once a week. Take with a full glass of water on an empty stomach.   ALPRAZolam (XANAX) 1 MG tablet ! Each morning, one each evening, and 1/2 each afternoon   amLODipine (NORVASC) 10 MG tablet Take 1 tablet (10 mg total) by mouth daily.   apixaban (ELIQUIS) 5 MG TABS tablet Take 1 tablet (5 mg total) by mouth 2 (two) times daily.   atorvastatin (LIPITOR) 80 MG tablet Take 1 tablet (80 mg total) by mouth daily.   Bempedoic Acid-Ezetimibe (NEXLIZET) 180-10 MG TABS Take 1 tablet by mouth daily. Take 180 mg by mouth daily.   Blood Glucose Monitoring Suppl (ACCU-CHEK GUIDE) w/Device KIT Test Bs QID Dx E11.69   conjugated estrogens (PREMARIN) vaginal cream Use 0.5 gm in vagina nightly for 1 week then 2 x weekly   cyclobenzaprine (FLEXERIL) 10 MG tablet Take 1 tablet (10 mg total) by mouth 3 (three) times daily as needed for muscle spasms.   dapagliflozin propanediol (FARXIGA) 10 MG TABS tablet Take 1 tablet (10 mg total) by mouth daily.   diclofenac Sodium (VOLTAREN) 1 % GEL  Apply 2 g topically 4 (four) times daily.   famotidine (PEPCID) 20 MG tablet Take 1 tablet (20 mg total) by mouth at bedtime.   furosemide (LASIX) 20 MG tablet Take 1 tablet (20 mg total) by mouth daily.   oxybutynin (DITROPAN-XL) 5 MG 24 hr tablet Take 1 tablet (5 mg total) by mouth at bedtime.   pantoprazole (PROTONIX) 40 MG tablet Take 1 tablet (40 mg total) by mouth daily. For stomach   promethazine-dextromethorphan (PROMETHAZINE-DM) 6.25-15 MG/5ML syrup Take 1.3 mLs by mouth 4 (four) times  daily as needed.   tirzepatide (MOUNJARO) 10 MG/0.5ML Pen Inject 10 mg into the skin once a week.   tirzepatide (MOUNJARO) 7.5 MG/0.5ML Pen Inject 7.5 mg into the skin once a week. Use when 10 is unavailable due to back order   Facility-Administered Encounter Medications as of 03/05/2024  Medication   promethazine (PHENERGAN) injection 25 mg    Allergies (verified) Penicillins, Metformin and related, Gabapentin, Lisinopril, and Soap   History: Past Medical History:  Diagnosis Date   (HFpEF) heart failure with preserved ejection fraction (HCC)    Anxiety    Aortic atherosclerosis (HCC)    Arthritis    left hand   Asthma    daily and prn inhalers   CAD (coronary artery disease)    Carotid artery disease (HCC)    Chronic cough    Chronic otitis media 12/2017   Full dentures    GERD (gastroesophageal reflux disease)    History of stroke 09/2017   weakness right hand, numbness right side face   Hyperlipidemia    Hypertension    states under control with meds., has been on med. x a long time, per pt.   Non-insulin dependent type 2 diabetes mellitus (HCC)    Orthostatic hypotension    Overactive bladder    PSVT (paroxysmal supraventricular tachycardia) (HCC)    Recurrent acute suppurative otitis media without spontaneous rupture of left tympanic membrane 02/19/2018   Stroke (HCC)    Transient cerebral ischemia    Past Surgical History:  Procedure Laterality Date   ABDOMINAL HYSTERECTOMY     complete   CATARACT EXTRACTION W/ INTRAOCULAR LENS IMPLANT Right    CHOLECYSTECTOMY     COLONOSCOPY N/A 08/17/2015   Procedure: COLONOSCOPY;  Surgeon: Malissa Hippo, MD;  Location: [facility];  Service: Endoscopy;  Laterality: N/A;  200 - moved to 7:30 - Ann notified pt   GAS INSERTION Right    x 2 - eye   IR ANGIO INTRA EXTRACRAN SEL INTERNAL CAROTID BILAT MOD SED  10/22/2023   IR ANGIO VERTEBRAL SEL VERTEBRAL UNI L MOD SED  10/22/2023   LACRIMAL DUCT EXPLORATION Bilateral     removal of tear ducts   MYRINGOTOMY WITH TUBE PLACEMENT Bilateral 01/28/2018   Procedure: BILATERAL MYRINGOTOMY WITH TUBE PLACEMENT;  Surgeon: Newman Pies, MD;  Location: [facility];  Service: ENT;  Laterality: Bilateral;   VITRECTOMY Left 09/14/2015   Family History  Problem Relation Age of Onset   Breast cancer Mother    Arthritis Mother    Diabetes Mother    CVA Mother    Stroke Mother    Diabetes Father    Heart disease Father        CABG.  Does not know age of onset   Asthma Father    Hepatitis C Sister    Peripheral Artery Disease Daughter    Arthritis Brother    Cancer Brother  metastic cancer   Arthritis-Osteo Son    Social History   Socioeconomic History   Marital status: Married    Spouse name: Not on file   Number of children: 3   Years of education: Not on file   Highest education level: GED or equivalent  Occupational History   Occupation: retired    Comment: Producer, television/film/video and restaurants  Tobacco Use   Smoking status: Never   Smokeless tobacco: Never  Vaping Use   Vaping status: Never Used  Substance and Sexual Activity   Alcohol use: Not Currently   Drug use: No   Sexual activity: Yes    Birth control/protection: Surgical    Comment: hyst  Other Topics Concern   Not on file  Social History Narrative   Lives at home home with husband with    Son, daughter in law and 3 children.  Lucila Maine, his girlfriend and her child also moved in 10/2018      Disabled.   Social Drivers of Corporate investment banker Strain: Low Risk  (03/05/2024)   Overall Financial Resource Strain (CARDIA)    Difficulty of Paying Living Expenses: Not hard at all  Food Insecurity: No Food Insecurity (03/05/2024)   Hunger Vital Sign    Worried About Running Out of Food in the Last Year: Never true    Ran Out of Food in the Last Year: Never true  Transportation Needs: No Transportation Needs (03/05/2024)   PRAPARE - Administrator, Civil Service  (Medical): No    Lack of Transportation (Non-Medical): No  Physical Activity: Sufficiently Active (12/05/2023)   Exercise Vital Sign    Days of Exercise per Week: 4 days    Minutes of Exercise per Session: 60 min  Recent Concern: Physical Activity - Insufficiently Active (11/04/2023)   Exercise Vital Sign    Days of Exercise per Week: 7 days    Minutes of Exercise per Session: 20 min  Stress: Stress Concern Present (12/05/2023)   [employer] of Occupational Health - Occupational Stress Questionnaire    Feeling of Stress : Very much  Social Connections: Moderately Isolated (03/05/2024)   Social Connection and Isolation Panel [NHANES]    Frequency of Communication with Friends and Family: More than three times a week    Frequency of Social Gatherings with Friends and Family: More than three times a week    Attends Religious Services: Never    Database administrator or Organizations: No    Attends Engineer, structural: Never    Marital Status: Married    Tobacco Counseling Counseling given: Yes    Clinical Intake:  Pre-visit preparation completed: Yes  Pain : No/denies pain     BMI - recorded: 29.45 Nutritional Status: BMI 25 -29 Overweight Nutritional Risks: None Diabetes: Yes CBG done?:  (79)  Lab Results  Component Value Date   HGBA1C 5.9 (H) 02/10/2024   HGBA1C 6.3 (H) 11/11/2023   HGBA1C 6.0 (H) 08/12/2023     How often do you need to have someone help you when you read instructions, pamphlets, or other written materials from your doctor or pharmacy?: 1 - Never  Interpreter Needed?: No  Information entered by :: Zane Herald T/CMA   Activities of Daily Living     03/04/2024    1:33 PM  In your present state of health, do you have any difficulty performing the following activities:  Hearing? 0  Vision? 0  Difficulty concentrating or making decisions? 0  Walking or climbing stairs? 0  Dressing or bathing? 0  Doing errands, shopping? 0  Preparing Food  and eating ? N  Using the Toilet? N  In the past six months, have you accidently leaked urine? Y  Do you have problems with loss of bowel control? N  Managing your Medications? N  Managing your Finances? N  Housekeeping or managing your Housekeeping? N    Patient Care Team: Mechele Claude, MD as PCP - General (Family Medicine) Drema Dallas, DO as Consulting Physician (Neurology) Randa Spike Kelton Pillar, [name] as Social Worker (Licensed Clinical Social Worker) Cresenciano Genre, Lilla Shook, [facility] (Pharmacist) Christell Constant, MD as Consulting Physician (Cardiology) Michaelle Copas, MD as Referring Physician (Optometry)  Indicate any recent Medical Services you may have received from other than [facility] providers in the past year (date may be approximate).     Assessment:   This is a routine wellness examination for Yvonne [name].  Hearing/Vision screen Hearing Screening - Comments:: Pt denied w/hearing def Vision Screening - Comments:: Pt denies vision def Pt goes to [facility] in [place], [place]   Goals Addressed   None    Depression Screen     03/05/2024   12:32 PM 02/25/2024    8:14 AM 02/10/2024    9:05 AM 12/06/2023    8:20 AM 11/11/2023   10:00 AM 11/11/2023    9:55 AM 11/04/2023   12:33 PM  PHQ 2/9 Scores  PHQ - 2 Score 2 5 5 5 5  0 5  PHQ- 9 Score 5 21 20 20 20  18     Fall Risk     03/05/2024   12:26 PM 03/04/2024    1:33 PM 02/25/2024    8:14 AM 12/06/2023    8:19 AM 11/11/2023   10:00 AM  Fall Risk   Falls in the past year? 0 0 1 1 0  Number falls in past yr: 0 1 0 0   Injury with Fall? 0 0 0 1   Risk for fall due to : No Fall Risks  No Fall Risks Impaired balance/gait   Follow up Falls prevention discussed;Falls evaluation completed  Falls evaluation completed Falls evaluation completed     MEDICARE RISK AT HOME:  Medicare Risk at Home Any stairs in or around the home?: (Patient-Rptd) Yes If so, are there any without handrails?: (Patient-Rptd) No Home free of loose throw rugs in  walkways, pet beds, electrical cords, etc?: (Patient-Rptd) Yes Adequate lighting in your home to reduce risk of falls?: (Patient-Rptd) Yes Life alert?: (Patient-Rptd) No Use of a cane, walker or w/c?: (Patient-Rptd) No Grab bars in the bathroom?: (Patient-Rptd) No Shower chair or bench in shower?: (Patient-Rptd) No Elevated toilet seat or a handicapped toilet?: (Patient-Rptd) No  TIMED UP AND GO:  Was the test performed?  No  Cognitive Function: 6CIT completed    12/02/2018    9:49 AM 06/06/2017    8:37 AM 07/20/2015   10:10 AM  MMSE - Mini Mental State Exam  Orientation to time 5 5 5   Orientation to Place 5 5 5   Registration 3 3 3   Attention/ Calculation 5 5 5   Recall 3 3 3   Language- name 2 objects 2 2 2   Language- repeat 1 1 1   Language- follow 3 step command 3 3 3   Language- read & follow direction 1 1 1   Write a sentence 1 1 1   Copy design 1 1 1   Total score 30 30 30  03/05/2024   12:36 PM 03/05/2023   11:19 AM 03/01/2022   11:26 AM 01/18/2020   11:07 AM  6CIT Screen  What Year? 0 points 0 points 0 points 0 points  What month? 0 points 0 points 0 points 0 points  What time? 0 points 0 points 0 points 0 points  Count back from 20 0 points 0 points 0 points 0 points  Months in reverse 0 points 0 points 0 points 0 points  Repeat phrase 0 points 0 points 4 points 0 points  Total Score 0 points 0 points 4 points 0 points    Immunizations Immunization History  Administered Date(s) Administered   Fluad Quad(high Dose 65+) 08/30/2021   Influenza Split 08/18/2015   Influenza,inj,Quad PF,6+ Mos 10/22/2018   Influenza-Unspecified 09/14/2013   Moderna Sars-Covid-2 Vaccination 06/14/2020, 07/12/2020   PNEUMOCOCCAL CONJUGATE-20 04/17/2022   Pneumococcal Conjugate-13 01/06/2015   Pneumococcal Polysaccharide-23 10/22/2018   Tdap 06/06/2017, 05/22/2021   Zoster Recombinant(Shingrix) 06/06/2017, 02/26/2018    Screening Tests Health Maintenance  Topic Date Due    COVID-19 Vaccine (3 - Moderna risk series) 03/21/2025 (Originally 08/09/2020)   INFLUENZA VACCINE  06/26/2024   MAMMOGRAM  06/30/2024   FOOT EXAM  08/11/2024   HEMOGLOBIN A1C  08/12/2024   OPHTHALMOLOGY EXAM  09/30/2024   Diabetic kidney evaluation - Urine ACR  11/10/2024   Diabetic kidney evaluation - eGFR measurement  02/09/2025   Medicare Annual Wellness (AWV)  03/05/2025   DEXA SCAN  05/09/2025   Colonoscopy  12/21/2029   DTaP/Tdap/Td (3 - Td or Tdap) 05/23/2031   Pneumonia Vaccine 88+ Years old  Completed   Hepatitis C Screening  Completed   Zoster Vaccines- Shingrix  Completed   HPV VACCINES  Aged Out   Meningococcal B Vaccine  Aged Out   COLON CANCER SCREENING ANNUAL FOBT  Discontinued    Health Maintenance  There are no preventive care reminders to display for this patient.  Health Maintenance Items Addressed: See Nurse Notes  Additional Screening:  Vision Screening: Recommended annual ophthalmology exams for early detection of glaucoma and other disorders of the eye.  Dental Screening: Recommended annual dental exams for proper oral hygiene  Community Resource Referral / Chronic Care Management: CRR required this visit?  Yes   CCM required this visit?  No     Plan:     I have personally reviewed and noted the following in the patient's chart:   Medical and social history Use of alcohol, tobacco or illicit drugs  Current medications and supplements including opioid prescriptions. Patient is not currently taking opioid prescriptions. Functional ability and status Nutritional status Physical activity Advanced directives List of other physicians Hospitalizations, surgeries, and ER visits in previous 12 months Vitals Screenings to include cognitive, depression, and falls Referrals and appointments  In addition, I have reviewed and discussed with patient certain preventive protocols, quality metrics, and best practice recommendations. A written personalized  care plan for preventive services as well as general preventive health recommendations were provided to patient.     Arta Silence, CMA   03/05/2024   After Visit Summary: (MyChart) Due to this being a telephonic visit, the after visit summary with patients personalized plan was offered to patient via MyChart   Notes:  [phone]

## 2024-03-06 ENCOUNTER — Telehealth: Payer: Self-pay

## 2024-03-06 NOTE — Progress Notes (Signed)
 Complex Care Management Note Care Guide Note  03/06/2024 Name: Yvonne [name] MRN: [id number] DOB: 06/13/1951   Complex Care Management Outreach Attempts: An unsuccessful telephone outreach was attempted today to offer the patient information about available complex care management services.  Follow Up Plan:  Additional outreach attempts will be made to offer the patient complex care management information and services.   Encounter Outcome:  No Answer  Hurbert Duran [facility]  [facility] Guide Direct Dial: [phone]  Fax: [phone] Website: [email or web address]

## 2024-03-10 ENCOUNTER — Telehealth: Payer: Self-pay

## 2024-03-10 DIAGNOSIS — R053 Chronic cough: Secondary | ICD-10-CM | POA: Diagnosis not present

## 2024-03-10 NOTE — Progress Notes (Signed)
 Complex Care Management Note Care Guide Note  03/10/2024 Name: Yvonne [name] MRN: [id number] DOB: 01-17-1951   Complex Care Management Outreach Attempts: A second unsuccessful outreach was attempted today to offer the patient with information about available complex care management services.  Follow Up Plan:  Additional outreach attempts will be made to offer the patient complex care management information and services.   Encounter Outcome:  No Answer  Chane Cowden [facility]  [facility] Guide Direct Dial: [phone]  Fax: [phone] Website: [email or web address]

## 2024-03-11 ENCOUNTER — Telehealth: Payer: Self-pay

## 2024-03-11 NOTE — Progress Notes (Signed)
 Complex Care Management Note Care Guide Note  03/11/2024 Name: Yvonne [name] MRN: [id number] DOB: 1951/06/04   Complex Care Management Outreach Attempts: A third unsuccessful outreach was attempted today to offer the patient with information about available complex care management services.  Follow Up Plan:  No further outreach attempts will be made at this time. We have been unable to contact the patient to offer or enroll patient in complex care management services.  Encounter Outcome:  No Answer  Reshad Saab [facility]  [facility] Guide Direct Dial: [phone]  Fax: [phone] Website: [email or web address]

## 2024-03-13 ENCOUNTER — Institutional Professional Consult (permissible substitution) (INDEPENDENT_AMBULATORY_CARE_PROVIDER_SITE_OTHER)

## 2024-03-17 ENCOUNTER — Ambulatory Visit: Admitting: Family Medicine

## 2024-03-18 ENCOUNTER — Encounter: Payer: Self-pay | Admitting: Family Medicine

## 2024-03-19 ENCOUNTER — Ambulatory Visit (INDEPENDENT_AMBULATORY_CARE_PROVIDER_SITE_OTHER): Admitting: Family Medicine

## 2024-03-19 ENCOUNTER — Ambulatory Visit: Admitting: Family Medicine

## 2024-03-19 VITALS — BP 136/71 | HR 77 | Temp 98.4°F | Ht 62.0 in | Wt 166.4 lb

## 2024-03-19 DIAGNOSIS — R221 Localized swelling, mass and lump, neck: Secondary | ICD-10-CM

## 2024-03-19 NOTE — Progress Notes (Signed)
   Subjective:  Patient ID: Yvonne [name], female    DOB: February 10, 1951  Age: 73 y.o. MRN: [id number]  CC: Neck Pain   HPI Yvonne [name] presents for lump at base of neck on right.No pain. Getting bigger.     03/19/2024    4:20 PM 03/05/2024   12:32 PM 02/25/2024    8:14 AM  Depression screen PHQ 2/9  Decreased Interest 2 1 3   Down, Depressed, Hopeless 3 1 2   PHQ - 2 Score 5 2 5   Altered sleeping 3 3 3   Tired, decreased energy 2 0 2  Change in appetite 2 0 3  Feeling bad or failure about yourself  3 0 3  Trouble concentrating 1 0 2  Moving slowly or fidgety/restless 2 0 3  Suicidal thoughts 0 0 0  PHQ-9 Score 18 5 21   Difficult doing work/chores Somewhat difficult Somewhat difficult     History Yvonne [name] has a past medical history of (HFpEF) heart failure with preserved ejection fraction (HCC), Anxiety, Aortic atherosclerosis (HCC), Arthritis, Asthma, CAD (coronary artery disease), Carotid artery disease (HCC), Chronic cough, Chronic otitis media (12/2017), Full dentures, GERD (gastroesophageal reflux disease), History of stroke (09/2017), Hyperlipidemia, Hypertension, Non-insulin  dependent type 2 diabetes mellitus (HCC), Orthostatic hypotension, Overactive bladder, PSVT (paroxysmal supraventricular tachycardia) (HCC), Recurrent acute suppurative otitis media without spontaneous rupture of left tympanic membrane (02/19/2018), Stroke (HCC), and Transient cerebral ischemia.   She has a past surgical history that includes Cholecystectomy; Colonoscopy (N/A, 08/17/2015); Abdominal hysterectomy; Lacrimal duct exploration (Bilateral); Cataract extraction w/ intraocular lens implant (Right); Vitrectomy (Left, 09/14/2015); Gas insertion (Right); Myringotomy with tube placement (Bilateral, 01/28/2018); IR ANGIO INTRA EXTRACRAN SEL INTERNAL CAROTID BILAT MOD SED (10/22/2023); and IR ANGIO VERTEBRAL SEL VERTEBRAL UNI L MOD SED (10/22/2023).   Her family history includes Arthritis in her brother and mother;  Arthritis-Osteo in her son; Asthma in her father; Breast cancer in her mother; CVA in her mother; Cancer in her brother; Diabetes in her father and mother; Heart disease in her father; Hepatitis C in her sister; Peripheral Artery Disease in her daughter; Stroke in her mother.She reports that she has never smoked. She has never used smokeless tobacco. She reports that she does not currently use alcohol. She reports that she does not use drugs.    ROS Review of Systems  Objective:  BP 136/71   Pulse 77   Temp 98.4 F (36.9 C) (Temporal)   Ht 5\' 2"  (1.575 m)   Wt 166 lb 6.4 oz (75.5 kg)   SpO2 98%   BMI 30.43 kg/m   BP Readings from Last 3 Encounters:  03/19/24 136/71  03/05/24 135/73  02/25/24 135/73    Wt Readings from Last 3 Encounters:  03/19/24 166 lb 6.4 oz (75.5 kg)  03/05/24 161 lb (73 kg)  02/25/24 161 lb (73 kg)     Physical Exam Musculoskeletal:        General: Tenderness (at posterior neck.) present.  Skin:    Findings: Lesion (1 cm seb cyst at right lateral neck base) present.      Assessment & Plan:  Subcutaneous nodule of neck -     Ambulatory referral to Dermatology     Follow-up: No follow-ups on file.  Roise Cleaver, M.D.

## 2024-03-20 ENCOUNTER — Telehealth: Payer: Self-pay

## 2024-03-20 NOTE — Telephone Encounter (Signed)
 Copied from CRM [id number]. Topic: Clinical - Prescription Issue >> Mar 20, 2024  1:55 PM Carlatta H wrote: Reason for CRM: Patient was suppose to have a pain medication called in yesterday//Medication was never sent to pharmacy//Please call to advise patient

## 2024-03-22 ENCOUNTER — Encounter: Payer: Self-pay | Admitting: Family Medicine

## 2024-03-24 DIAGNOSIS — M50222 Other cervical disc displacement at C5-C6 level: Secondary | ICD-10-CM | POA: Diagnosis not present

## 2024-03-24 DIAGNOSIS — E119 Type 2 diabetes mellitus without complications: Secondary | ICD-10-CM | POA: Diagnosis not present

## 2024-03-24 DIAGNOSIS — E785 Hyperlipidemia, unspecified: Secondary | ICD-10-CM | POA: Diagnosis not present

## 2024-03-24 DIAGNOSIS — R519 Headache, unspecified: Secondary | ICD-10-CM | POA: Diagnosis not present

## 2024-03-24 DIAGNOSIS — M4312 Spondylolisthesis, cervical region: Secondary | ICD-10-CM | POA: Diagnosis not present

## 2024-03-24 DIAGNOSIS — M1990 Unspecified osteoarthritis, unspecified site: Secondary | ICD-10-CM | POA: Diagnosis not present

## 2024-03-24 DIAGNOSIS — F419 Anxiety disorder, unspecified: Secondary | ICD-10-CM | POA: Diagnosis not present

## 2024-03-24 DIAGNOSIS — M47812 Spondylosis without myelopathy or radiculopathy, cervical region: Secondary | ICD-10-CM | POA: Diagnosis not present

## 2024-03-24 DIAGNOSIS — M4802 Spinal stenosis, cervical region: Secondary | ICD-10-CM | POA: Diagnosis not present

## 2024-03-24 DIAGNOSIS — Z66 Do not resuscitate: Secondary | ICD-10-CM | POA: Diagnosis not present

## 2024-03-24 DIAGNOSIS — J42 Unspecified chronic bronchitis: Secondary | ICD-10-CM | POA: Diagnosis not present

## 2024-03-24 DIAGNOSIS — R079 Chest pain, unspecified: Secondary | ICD-10-CM | POA: Diagnosis not present

## 2024-03-24 DIAGNOSIS — I10 Essential (primary) hypertension: Secondary | ICD-10-CM | POA: Diagnosis not present

## 2024-03-24 DIAGNOSIS — M542 Cervicalgia: Secondary | ICD-10-CM | POA: Diagnosis not present

## 2024-03-24 NOTE — Telephone Encounter (Signed)
 Returning nurse call.

## 2024-03-25 ENCOUNTER — Other Ambulatory Visit: Payer: Self-pay | Admitting: Family Medicine

## 2024-03-25 ENCOUNTER — Telehealth: Payer: Self-pay

## 2024-03-25 DIAGNOSIS — N1831 Chronic kidney disease, stage 3a: Secondary | ICD-10-CM

## 2024-03-25 MED ORDER — PREGABALIN 75 MG PO CAPS: 75.0000 mg | ORAL_CAPSULE | Freq: Every day | ORAL | 1 refills | Status: DC

## 2024-03-25 NOTE — Telephone Encounter (Signed)
 Please let the patient know that I sent their prescription to their pharmacy. Thanks, WS

## 2024-03-25 NOTE — Transitions of Care (Post Inpatient/ED Visit) (Signed)
 03/25/2024  Name: Yvonne [name] MRN: [id number] DOB: 1951/03/31  Today's TOC FU Call Status: Today's TOC FU Call Status:: Successful TOC FU Call Completed TOC FU Call Complete Date: 03/25/24 Patient's Name and Date of Birth confirmed.  Transition Care Management Follow-up Telephone Call Date of Discharge: 03/24/24 Discharge Facility: Other [facility]) Name of Other (Non[facility]) Discharge Facility: [facility] Type of Discharge: Emergency Department Reason for ED Visit: Other: (cervicalgia) How have you been since you were released from the hospital?: Same Any questions or concerns?: No  Items Reviewed: Did you receive and understand the discharge instructions provided?: Yes Medications obtained,verified, and reconciled?: Yes (Medications Reviewed) Any new allergies since your discharge?: No Dietary orders reviewed?: Yes Do you have support at home?: Yes People in Home [RPT]: child(ren), adult  Medications Reviewed Today: Medications Reviewed Today     Reviewed by Darrall Ellison, LPN (Licensed Practical Nurse) on 03/25/24 at 1523  Med List Status: <None>   Medication Order Taking? Sig Documenting Provider Last Dose Status Informant  ACCU-CHEK GUIDE TEST test strip [id number] No TEST BLOOD SUGAR 4 TIMES DAILY DX E11.69 Vicky Grange M, DO Taking Active   Accu-Chek Softclix Lancets lancets [id number] No Test BS up to 4 times daily Dx E11.69 Roise Cleaver, MD Taking Active Self, Pharmacy Records  albuterol  (VENTOLIN  HFA) 108 [phone] Base) MCG/ACT inhaler [id number] No INHALE 2 PUFFS INTO THE LUNGS EVERY 6 HOURS AS NEEDED FOR WHEEZE OR SHORTNESS OF Karrie Paddy, MD Taking Active   alendronate  (FOSAMAX ) 70 MG tablet [id number] No Take 1 tablet (70 mg total) by mouth once a week. Take with a full glass of water  on an empty stomach. Roise Cleaver, MD Taking Active   ALPRAZolam  (XANAX ) 1 MG tablet [id number] No ! Each morning, one each evening, and 1/2 each afternoon Roise Cleaver,  MD Taking Active   amLODipine  (NORVASC ) 10 MG tablet [id number] No Take 1 tablet (10 mg total) by mouth daily. Roise Cleaver, MD Taking Active   apixaban  (ELIQUIS ) 5 MG TABS tablet [id number] No Take 1 tablet (5 mg total) by mouth 2 (two) times daily. Roise Cleaver, MD Taking Active   atorvastatin  (LIPITOR ) 80 MG tablet [id number] No Take 1 tablet (80 mg total) by mouth daily. Roise Cleaver, MD Taking Active   Bempedoic Acid-Ezetimibe  (NEXLIZET ) 180-10 MG TABS [id number] No Take 1 tablet by mouth daily. Take 180 mg by mouth daily. Roise Cleaver, MD Taking Active   Blood Glucose Monitoring Suppl (ACCU-CHEK GUIDE) w/Device Suzanne Erps [id number] No Test Bs QID Dx E11.69 Roise Cleaver, MD Taking Active Self, Pharmacy Records  conjugated estrogens  (PREMARIN ) vaginal cream [id number] No Use 0.5 gm in vagina nightly for 1 week then 2 x weekly Javan Messing, NP Taking Active   cyclobenzaprine  (FLEXERIL ) 10 MG tablet [id number] No Take 1 tablet (10 mg total) by mouth 3 (three) times daily as needed for muscle spasms. Dettinger, Lucio Sabin, MD Taking Active   dapagliflozin  propanediol (FARXIGA ) 10 MG TABS tablet [id number] No Take 1 tablet (10 mg total) by mouth daily. Roise Cleaver, MD Taking Active   diclofenac  Sodium (VOLTAREN ) 1 % GEL [id number] No Apply 2 g topically 4 (four) times daily. Dettinger, Lucio Sabin, MD Taking Active   famotidine  (PEPCID ) 20 MG tablet [id number] No Take 1 tablet (20 mg total) by mouth at bedtime. Roise Cleaver, MD Taking Active   furosemide  (LASIX ) 20 MG tablet [id number] No Take 1 tablet (20 mg total) by mouth daily. Roise Cleaver, MD Taking Active   oxybutynin  (DITROPAN -XL)  5 MG 24 hr tablet [id number] No Take 1 tablet (5 mg total) by mouth at bedtime. Roise Cleaver, MD Taking Active   pantoprazole  (PROTONIX ) 40 MG tablet [id number] No Take 1 tablet (40 mg total) by mouth daily. For stomach Roise Cleaver, MD Taking Active   pregabalin (LYRICA) 75 MG capsule [id number]  Take 1 capsule  (75 mg total) by mouth at bedtime. Roise Cleaver, MD  Active   promethazine  (PHENERGAN ) injection 25 mg [id number]   Delfina Feller, FNP  Active   promethazine -dextromethorphan (PROMETHAZINE -DM) 6.25-15 MG/5ML syrup [id number] No Take 1.3 mLs by mouth 4 (four) times daily as needed. Chrystine Crate, FNP Taking Active   tirzepatide  (MOUNJARO ) 10 MG/0.5ML Pen [id number] No Inject 10 mg into the skin once a week. Roise Cleaver, MD Taking Active   tirzepatide  (MOUNJARO ) 7.5 MG/0.5ML Pen [id number] No Inject 7.5 mg into the skin once a week. Use when 10 is unavailable due to back order Roise Cleaver, MD Taking Active             Home Care and Equipment/Supplies: Were Home Health Services Ordered?: NA Any new equipment or medical supplies ordered?: NA  Functional Questionnaire: Do you need assistance with bathing/showering or dressing?: No Do you need assistance with meal preparation?: No Do you need assistance with eating?: No Do you have difficulty maintaining continence: No Do you need assistance with getting out of bed/getting out of a chair/moving?: No Do you have difficulty managing or taking your medications?: No  Follow up appointments reviewed: PCP Follow-up appointment confirmed?: No (no avail appt) MD Provider Line Number:[phone] Given: No Specialist Hospital Follow-up appointment confirmed?: NA Do you need transportation to your follow-up appointment?: No Do you understand care options if your condition(s) worsen?: Yes-patient verbalized understanding    SIGNATURE Darrall Ellison, LPN [facility] Nurse Health Advisor Direct Dial [phone]

## 2024-03-25 NOTE — Telephone Encounter (Signed)
 Patient aware and verbalized understanding.

## 2024-03-31 ENCOUNTER — Encounter: Payer: Self-pay | Admitting: Family Medicine

## 2024-03-31 ENCOUNTER — Ambulatory Visit (INDEPENDENT_AMBULATORY_CARE_PROVIDER_SITE_OTHER): Admitting: Family Medicine

## 2024-03-31 ENCOUNTER — Inpatient Hospital Stay: Admitting: Nurse Practitioner

## 2024-03-31 VITALS — BP 134/69 | HR 78 | Temp 97.8°F | Ht 62.0 in | Wt 169.0 lb

## 2024-03-31 DIAGNOSIS — G959 Disease of spinal cord, unspecified: Secondary | ICD-10-CM | POA: Diagnosis not present

## 2024-03-31 MED ORDER — PREGABALIN 150 MG PO CAPS: 150.0000 mg | ORAL_CAPSULE | Freq: Every day | ORAL | 1 refills | Status: DC

## 2024-03-31 NOTE — Progress Notes (Signed)
 Subjective:  Patient ID: Yvonne [name], female    DOB: February 13, 1951  Age: 73 y.o. MRN: [id number]  CC: Hospitalization Follow-up (Severe neck pain doing a lot better. )   HPI Yvonne [name] presents for Neck pain. Loss of ROM. Went to ED at [facility]. Pain is now 3-4/10. NKI. No trigger. Started posterior right. Went to [facility]. Was 10/10 at E.D. Pain med helps.      03/31/2024   12:02 PM 03/19/2024    4:20 PM 03/05/2024   12:32 PM  Depression screen PHQ 2/9  Decreased Interest 2 2 1   Down, Depressed, Hopeless 3 3 1   PHQ - 2 Score 5 5 2   Altered sleeping 3 3 3   Tired, decreased energy 2 2 0  Change in appetite 2 2 0  Feeling bad or failure about yourself  3 3 0  Trouble concentrating 1 1 0  Moving slowly or fidgety/restless 3 2 0  Suicidal thoughts 0 0 0  PHQ-9 Score 19 18 5   Difficult doing work/chores Somewhat difficult Somewhat difficult Somewhat difficult    History Yvonne [name] has a past medical history of (HFpEF) heart failure with preserved ejection fraction (HCC), Anxiety, Aortic atherosclerosis (HCC), Arthritis, Asthma, CAD (coronary artery disease), Carotid artery disease (HCC), Chronic cough, Chronic otitis media (12/2017), Full dentures, GERD (gastroesophageal reflux disease), History of stroke (09/2017), Hyperlipidemia, Hypertension, Non-insulin  dependent type 2 diabetes mellitus (HCC), Orthostatic hypotension, Overactive bladder, PSVT (paroxysmal supraventricular tachycardia) (HCC), Recurrent acute suppurative otitis media without spontaneous rupture of left tympanic membrane (02/19/2018), Stroke (HCC), and Transient cerebral ischemia.   She has a past surgical history that includes Cholecystectomy; Colonoscopy (N/A, 08/17/2015); Abdominal hysterectomy; Lacrimal duct exploration (Bilateral); Cataract extraction w/ intraocular lens implant (Right); Vitrectomy (Left, 09/14/2015); Gas insertion (Right); Myringotomy with tube placement (Bilateral, 01/28/2018); IR ANGIO INTRA EXTRACRAN  SEL INTERNAL CAROTID BILAT MOD SED (10/22/2023); and IR ANGIO VERTEBRAL SEL VERTEBRAL UNI L MOD SED (10/22/2023).   Her family history includes Arthritis in her brother and mother; Arthritis-Osteo in her son; Asthma in her father; Breast cancer in her mother; CVA in her mother; Cancer in her brother; Diabetes in her father and mother; Heart disease in her father; Hepatitis C in her sister; Peripheral Artery Disease in her daughter; Stroke in her mother.She reports that she has never smoked. She has never used smokeless tobacco. She reports that she does not currently use alcohol. She reports that she does not use drugs.    ROS Review of Systems  Objective:  BP 134/69   Pulse 78   Temp 97.8 F (36.6 C)   Ht 5\' 2"  (1.575 m)   Wt 169 lb (76.7 kg)   SpO2 97%   BMI 30.91 kg/m   BP Readings from Last 3 Encounters:  03/31/24 134/69  03/19/24 136/71  03/05/24 135/73    Wt Readings from Last 3 Encounters:  03/31/24 169 lb (76.7 kg)  03/19/24 166 lb 6.4 oz (75.5 kg)  03/05/24 161 lb (73 kg)     Physical Exam Constitutional:      General: She is not in acute distress.    Appearance: She is well-developed.  Cardiovascular:     Rate and Rhythm: Normal rate and regular rhythm.  Pulmonary:     Breath sounds: Normal breath sounds.  Musculoskeletal:        General: Normal range of motion.  Skin:    General: Skin is warm and dry.  Neurological:     Mental Status: She is alert  and oriented to person, place, and time.      Assessment & Plan:  Cervical myelopathy (HCC)  Other orders -     Pregabalin ; Take 1 capsule (150 mg total) by mouth at bedtime.  Dispense: 30 capsule; Refill: 1     Follow-up: No follow-ups on file.  Yvonne [name], M.D.

## 2024-04-16 ENCOUNTER — Other Ambulatory Visit: Payer: Self-pay | Admitting: Family Medicine

## 2024-04-16 ENCOUNTER — Ambulatory Visit: Payer: Self-pay | Admitting: Family Medicine

## 2024-04-16 ENCOUNTER — Ambulatory Visit (HOSPITAL_COMMUNITY)
Admission: RE | Admit: 2024-04-16 | Discharge: 2024-04-16 | Disposition: A | Discharge status: Home / Self Care | Source: Ambulatory Visit | Attending: Family Medicine | Admitting: Family Medicine

## 2024-04-16 ENCOUNTER — Ambulatory Visit: Admitting: Family Medicine

## 2024-04-16 ENCOUNTER — Encounter: Payer: Self-pay | Admitting: Family Medicine

## 2024-04-16 VITALS — BP 128/60 | HR 69 | Temp 97.9°F | Ht 62.0 in | Wt 167.0 lb

## 2024-04-16 DIAGNOSIS — E1122 Type 2 diabetes mellitus with diabetic chronic kidney disease: Secondary | ICD-10-CM

## 2024-04-16 DIAGNOSIS — J439 Emphysema, unspecified: Secondary | ICD-10-CM

## 2024-04-16 DIAGNOSIS — N1831 Chronic kidney disease, stage 3a: Secondary | ICD-10-CM

## 2024-04-16 DIAGNOSIS — R55 Syncope and collapse: Secondary | ICD-10-CM

## 2024-04-16 DIAGNOSIS — M7989 Other specified soft tissue disorders: Secondary | ICD-10-CM

## 2024-04-16 DIAGNOSIS — I10 Essential (primary) hypertension: Secondary | ICD-10-CM | POA: Diagnosis not present

## 2024-04-16 DIAGNOSIS — F1320 Sedative, hypnotic or anxiolytic dependence, uncomplicated: Secondary | ICD-10-CM | POA: Diagnosis not present

## 2024-04-16 DIAGNOSIS — F411 Generalized anxiety disorder: Secondary | ICD-10-CM

## 2024-04-16 DIAGNOSIS — Z79899 Other long term (current) drug therapy: Secondary | ICD-10-CM

## 2024-04-16 DIAGNOSIS — I6782 Cerebral ischemia: Secondary | ICD-10-CM | POA: Diagnosis not present

## 2024-04-16 DIAGNOSIS — R9089 Other abnormal findings on diagnostic imaging of central nervous system: Secondary | ICD-10-CM | POA: Diagnosis not present

## 2024-04-16 LAB — URINALYSIS, COMPLETE
Bilirubin, UA: NEGATIVE
Glucose, UA: NEGATIVE
Ketones, UA: NEGATIVE
Nitrite, UA: NEGATIVE
Protein,UA: NEGATIVE
Specific Gravity, UA: 1.02 (ref 1.005–1.030)
Urobilinogen, Ur: 0.2 mg/dL (ref 0.2–1.0)
pH, UA: 5 (ref 5.0–7.5)

## 2024-04-16 LAB — MICROSCOPIC EXAMINATION
Renal Epithel, UA: NONE SEEN /HPF
Yeast, UA: NONE SEEN

## 2024-04-16 MED ORDER — ALPRAZOLAM 1 MG PO TABS: ORAL_TABLET | ORAL | 5 refills | Status: DC

## 2024-04-16 MED ORDER — ATORVASTATIN CALCIUM 80 MG PO TABS: 80.0000 mg | ORAL_TABLET | Freq: Every day | ORAL | 0 refills | Status: DC

## 2024-04-16 MED ORDER — ALBUTEROL SULFATE HFA 108 (90 BASE) MCG/ACT IN AERS: INHALATION_SPRAY | RESPIRATORY_TRACT | 0 refills | Status: DC

## 2024-04-16 MED ORDER — TIRZEPATIDE 10 MG/0.5ML ~~LOC~~ SOAJ: 10.0000 mg | SUBCUTANEOUS | 3 refills | Status: AC

## 2024-04-16 MED ORDER — CEFUROXIME AXETIL 250 MG PO TABS: 250.0000 mg | ORAL_TABLET | Freq: Two times a day (BID) | ORAL | 0 refills | Status: AC

## 2024-04-16 MED ORDER — AMLODIPINE BESYLATE 10 MG PO TABS: 10.0000 mg | ORAL_TABLET | Freq: Every day | ORAL | 0 refills | Status: DC

## 2024-04-16 MED ORDER — PREGABALIN 150 MG PO CAPS: 150.0000 mg | ORAL_CAPSULE | Freq: Every day | ORAL | 1 refills | Status: DC

## 2024-04-16 MED ORDER — FUROSEMIDE 20 MG PO TABS: 20.0000 mg | ORAL_TABLET | Freq: Every day | ORAL | 1 refills | Status: DC

## 2024-04-16 MED ORDER — GADOBUTROL 1 MMOL/ML IV SOLN
7.5000 mL | Freq: Once | INTRAVENOUS | Status: AC | PRN
  Administered 2024-04-16: 7.5 mL via INTRAVENOUS

## 2024-04-16 NOTE — Progress Notes (Unsigned)
 Subjective:  Patient ID: Yvonne [name], female    DOB: 02-10-1951  Age: 73 y.o. MRN: [id number]  CC: Medication Refill (Pended/Needs tox)   HPI BRILYNN BIASI presents for felt bad yesterday. Passed out for an undetermined time in her yard yesterday. Probably occurred just after 4 PM. Grandson shook her back to consciousness. Couldn't speak. Then went to bed and slept all night . Had not had any side effects from the Lyrica  after the dose increase. Startd it right away on 5/6 or 7. Recalls no HA or post ictal state other than being very sleepy. She hasn't urinated, so she may have been dehydrated.        04/16/2024   10:26 AM 03/31/2024   12:02 PM 03/19/2024    4:20 PM  Depression screen PHQ 2/9  Decreased Interest 2 2 2   Down, Depressed, Hopeless 3 3 3   PHQ - 2 Score 5 5 5   Altered sleeping 2 3 3   Tired, decreased energy 2 2 2   Change in appetite 2 2 2   Feeling bad or failure about yourself  3 3 3   Trouble concentrating 1 1 1   Moving slowly or fidgety/restless 2 3 2   Suicidal thoughts 1 0 0  PHQ-9 Score 18 19 18   Difficult doing work/chores  Somewhat difficult Somewhat difficult    History Thersia has a past medical history of (HFpEF) heart failure with preserved ejection fraction (HCC), Anxiety, Aortic atherosclerosis (HCC), Arthritis, Asthma, CAD (coronary artery disease), Carotid artery disease (HCC), Chronic cough, Chronic otitis media (12/2017), Full dentures, GERD (gastroesophageal reflux disease), History of stroke (09/2017), Hyperlipidemia, Hypertension, Non-insulin  dependent type 2 diabetes mellitus (HCC), Orthostatic hypotension, Overactive bladder, PSVT (paroxysmal supraventricular tachycardia) (HCC), Recurrent acute suppurative otitis media without spontaneous rupture of left tympanic membrane (02/19/2018), Stroke (HCC), and Transient cerebral ischemia.   She has a past surgical history that includes Cholecystectomy; Colonoscopy (N/A, 08/17/2015); Abdominal hysterectomy;  Lacrimal duct exploration (Bilateral); Cataract extraction w/ intraocular lens implant (Right); Vitrectomy (Left, 09/14/2015); Gas insertion (Right); Myringotomy with tube placement (Bilateral, 01/28/2018); IR ANGIO INTRA EXTRACRAN SEL INTERNAL CAROTID BILAT MOD SED (10/22/2023); and IR ANGIO VERTEBRAL SEL VERTEBRAL UNI L MOD SED (10/22/2023).   Her family history includes Arthritis in her brother and mother; Arthritis-Osteo in her son; Asthma in her father; Breast cancer in her mother; CVA in her mother; Cancer in her brother; Diabetes in her father and mother; Heart disease in her father; Hepatitis C in her sister; Peripheral Artery Disease in her daughter; Stroke in her mother.She reports that she has never smoked. She has never used smokeless tobacco. She reports that she does not currently use alcohol. She reports that she does not use drugs.    ROS Review of Systems  Constitutional:  Fatigue: with malaise.Aaron Aas  HENT:  Negative for congestion.   Eyes:  Negative for visual disturbance.  Respiratory:  Negative for shortness of breath.   Cardiovascular:  Negative for chest pain.  Gastrointestinal:  Negative for abdominal pain, constipation, diarrhea, nausea and vomiting.  Genitourinary:  Negative for difficulty urinating.  Musculoskeletal:  Negative for arthralgias and myalgias.  Neurological:  Positive for headaches.  Psychiatric/Behavioral:  Negative for sleep disturbance.     Objective:  BP 128/60   Pulse 69   Temp 97.9 F (36.6 C)   Ht 5\' 2"  (1.575 m)   Wt 167 lb (75.8 kg)   SpO2 97%   BMI 30.54 kg/m   BP Readings from Last 3 Encounters:  04/16/24 128/60  03/31/24 134/69  03/19/24 136/71    Wt Readings from Last 3 Encounters:  04/16/24 167 lb (75.8 kg)  03/31/24 169 lb (76.7 kg)  03/19/24 166 lb 6.4 oz (75.5 kg)     Physical Exam Constitutional:      General: She is not in acute distress.    Appearance: She is well-developed.  HENT:     Head: Normocephalic and  atraumatic.  Eyes:     Conjunctiva/sclera: Conjunctivae normal.     Pupils: Pupils are equal, round, and reactive to light.  Neck:     Thyroid : No thyromegaly.  Cardiovascular:     Rate and Rhythm: Normal rate and regular rhythm.     Heart sounds: Normal heart sounds. No murmur heard. Pulmonary:     Effort: Pulmonary effort is normal. No respiratory distress.     Breath sounds: Normal breath sounds. No wheezing or rales.  Abdominal:     General: Bowel sounds are normal. There is no distension.     Palpations: Abdomen is soft.     Tenderness: There is no abdominal tenderness.  Musculoskeletal:        General: Normal range of motion.     Cervical back: Normal range of motion and neck supple.  Lymphadenopathy:     Cervical: No cervical adenopathy.  Skin:    General: Skin is warm and dry.  Neurological:     Mental Status: She is alert and oriented to person, place, and time.  Psychiatric:        Behavior: Behavior normal.        Thought Content: Thought content normal.        Judgment: Judgment normal.      Assessment & Plan:  Syncope, unspecified syncope type -     Urinalysis, Complete -     Urine Culture -     EKG 12-Lead -     MR BRAIN W WO CONTRAST; Future -     Microscopic Examination  Swelling of lower extremity -     Furosemide ; Take 1 tablet (20 mg total) by mouth daily.  Dispense: 90 tablet; Refill: 1  Essential hypertension -     amLODIPine  Besylate; Take 1 tablet (10 mg total) by mouth daily.  Dispense: 90 tablet; Refill: 0 -     Microscopic Examination  GAD (generalized anxiety disorder) -     ALPRAZolam ; ! Each morning, one each evening, and 1/2 each afternoon  Dispense: 75 tablet; Refill: 5  Benzodiazepine dependence, continuous (HCC) -     ALPRAZolam ; ! Each morning, one each evening, and 1/2 each afternoon  Dispense: 75 tablet; Refill: 5  Pulmonary emphysema, unspecified emphysema type (HCC) -     Albuterol  Sulfate HFA; INHALE 2 PUFFS INTO THE LUNGS  EVERY 6 HOURS AS NEEDED FOR WHEEZE OR SHORTNESS OF BREATH  Dispense: 54 each; Refill: 0  Controlled substance agreement signed -     Pregabalin ; Take 1 capsule (150 mg total) by mouth at bedtime.  Dispense: 30 capsule; Refill: 1 -     ALPRAZolam ; ! Each morning, one each evening, and 1/2 each afternoon  Dispense: 75 tablet; Refill: 5 -     ToxASSURE Select 13 (MW), Urine  Syncope and collapse  Type 2 diabetes mellitus with stage 3a chronic kidney disease, without long-term current use of insulin  (HCC) -     Atorvastatin  Calcium ; Take 1 tablet (80 mg total) by mouth daily.  Dispense: 90 tablet; Refill: 0  Other orders -  Tirzepatide ; Inject 10 mg into the skin once a week.  Dispense: 6 mL; Refill: 3     Follow-up: No follow-ups on file.  Roise Cleaver, M.D.

## 2024-04-18 ENCOUNTER — Telehealth: Admitting: Family Medicine

## 2024-04-18 DIAGNOSIS — R197 Diarrhea, unspecified: Secondary | ICD-10-CM | POA: Diagnosis not present

## 2024-04-18 NOTE — Progress Notes (Signed)
 Virtual Visit Consent   Yvonne [name], you are scheduled for a virtual visit with a [facility] provider today. Just as with appointments in the office, your consent must be obtained to participate. Your consent will be active for this visit and any virtual visit you may have with one of our providers in the next 365 days. If you have a MyChart account, a copy of this consent can be sent to you electronically.  As this is a virtual visit, video technology does not allow for your provider to perform a traditional examination. This may limit your provider's ability to fully assess your condition. If your provider identifies any concerns that need to be evaluated in person or the need to arrange testing (such as labs, EKG, etc.), we will make arrangements to do so. Although advances in technology are sophisticated, we cannot ensure that it will always work on either your end or our end. If the connection with a video visit is poor, the visit may have to be switched to a telephone visit. With either a video or telephone visit, we are not always able to ensure that we have a secure connection.  By engaging in this virtual visit, you consent to the provision of healthcare and authorize for your insurance to be billed (if applicable) for the services provided during this visit. Depending on your insurance coverage, you may receive a charge related to this service.  I need to obtain your verbal consent now. Are you willing to proceed with your visit today? Yvonne [name] has provided verbal consent on 04/18/2024 for a virtual visit (video or telephone). Yvonne Huger, FNP  Date: 04/18/2024 6:26 PM   Virtual Visit via Video Note   I, Yvonne [name], connected with  Yvonne [name]  ([id number], 05/10/1951) on 04/18/24 at  6:15 PM EDT by a video-enabled telemedicine application and verified that I am speaking with the correct person using two identifiers.  Location: Patient: Virtual Visit Location Patient:  Home Provider: Virtual Visit Location Provider: Home Office   I discussed the limitations of evaluation and management by telemedicine and the availability of in person appointments. The patient expressed understanding and agreed to proceed.    History of Present Illness: Yvonne [name] is a 72 y.o. who identifies as a female who was assigned female at birth, and is being seen today for diarrhea since Tuesday, low grade fever, no flare in diverticulitis, no abd pain, otc meds have not helped. No nausea or vomiting. Aaron Aas  HPI: HPI  Problems:  Patient Active Problem List   Diagnosis Date Noted   Cough productive of purulent sputum 11/04/2023   Nasal congestion 11/04/2023   Sore throat 11/04/2023   Acute cough 11/04/2023   Long-term current use of injectable noninsulin antidiabetic medication 08/12/2023   Pain of upper abdomen 12/05/2022   S/P hysterectomy with oophorectomy 11/23/2022   SUI (stress urinary incontinence, female) 11/23/2022   Dyspareunia in female 11/23/2022   Depression, major, single episode, severe (HCC) 04/10/2022   Peripheral edema 04/10/2022   History of stroke 01/24/2022   Coronary artery disease involving native coronary artery of native heart without angina pectoris 01/24/2022   TIA (transient ischemic attack) 01/18/2022   Type 2 diabetes mellitus with stage 3a chronic kidney disease, without long-term current use of insulin  (HCC) 01/17/2022   Sensory disturbance 01/17/2022   Syncope and collapse 01/16/2022   Restless legs 02/27/2021   Insomnia 02/27/2021   Polyneuropathy due to type 2 diabetes mellitus (HCC)  02/27/2021   Palpitations 01/20/2021   Aortic atherosclerosis (HCC) 01/20/2021   Orthostatic hypotension 01/20/2021   Carpal tunnel syndrome 08/23/2020   Diabetic lipidosis (HCC) 09/09/2019   Hypokalemia 12/11/2018   Benzodiazepine dependence, continuous (HCC) 10/22/2018   Chronic migraine without aura without status migrainosus, not intractable 02/19/2018    (HFpEF) heart failure with preserved ejection fraction (HCC) 03/21/2017   Chronic bilateral low back pain with bilateral sciatica 02/27/2017   Spondylosis of lumbar region without myelopathy or radiculopathy 08/15/2016   Pain of both hip joints 06/18/2016   Pain syndrome, chronic 06/18/2016   Chronic cough 05/17/2016   HLD (hyperlipidemia) 11/04/2015   Macular hole of right eye 09/14/2015   Obesity 07/20/2015   Osteopenia 07/20/2015   Precordial pain 07/06/2015   Overactive bladder    Hypertension    GAD (generalized anxiety disorder)    Acid reflux disease 08/17/2014    Allergies:  Allergies  Allergen Reactions   Penicillins Hives and Itching    Has patient had a PCN reaction causing immediate rash, facial/tongue/throat swelling, SOB or lightheadedness with hypotension: Yes Has patient had a PCN reaction causing severe rash involving mucus membranes or skin necrosis: Unk Has patient had a PCN reaction that required hospitalization: No Has patient had a PCN reaction occurring within the last 10 years: No If all of the above answers are "NO", then may proceed with Cephalosporin use.  Has patient had a PCN reaction causing immediate rash, facial/tongue/throat swelling, SOB or lightheadedness with hypotension: Yes Has patient had a PCN reaction causing severe rash involving mucus membranes or skin necrosis: No Has patient had a PCN reaction that required hospitalization No Has patient had a PCN reaction occurring within the last 10 years: No If all of the above answers are "NO", then may proceed with Cephalosporin use. Has patient had a PCN reaction causing immediate rash, facial/tongue/throat swelling, SOB or lightheadedness with hypotension: Yes Has patient had a PCN reaction causing severe rash involving mucus membranes or skin necrosis: Unk Has patient had a PCN reaction that required hospitalization: No Has patient had a PCN reaction occurring within the last 10 years: No If  all of the above answers are "NO", then may proceed with Cephalosporin use. Has patient had a PCN reaction causing immediate rash, facial/tongue/throat swelling, SOB or lightheadedness with hypotension: Yes Has patient had a PCN reaction causing severe rash involving mucus membranes or skin necrosis: No Has patient had a PCN reaction that required hospitalization No Has patient had a PCN reaction occurring within the last 10 years: No If all of the above answers are "NO", then may proceed with Cephalosporin use. Has patient had a PCN reaction causing immediate rash, facial/tongue/throat swelling, SOB or lightheadedness with hypotension: Yes Has patient had a PCN reaction causing sev... (TRUNCATED)   Metformin  And Related Diarrhea    dizziness   Gabapentin  Other (See Comments)    Causes a lot of lethargy at a higher dose Causes a lot of lethargy at a higher dose Causes a lot of lethargy at a higher dose   Lisinopril Cough    HAS A CHRONIC COUGH AND DID NOT NOTICE IMPROVEMENT OF COUGH WHEN LISINOPRIL WAS STOPPED HAS A CHRONIC COUGH AND DID NOT NOTICE IMPROVEMENT OF COUGH WHEN LISINOPRIL WAS STOPPED HAS A CHRONIC COUGH AND DID NOT NOTICE IMPROVEMENT OF COUGH WHEN LISINOPRIL WAS STOPPED   Soap Rash    DIAL soap   Medications:  Current Outpatient Medications:    ACCU-CHEK GUIDE TEST test strip,  TEST BLOOD SUGAR 4 TIMES DAILY DX E11.69, Disp: 400 strip, Rfl: 3   Accu-Chek Softclix Lancets lancets, Test BS up to 4 times daily Dx E11.69, Disp: 400 each, Rfl: 3   albuterol  (VENTOLIN  HFA) 108 (90 Base) MCG/ACT inhaler, INHALE 2 PUFFS INTO THE LUNGS EVERY 6 HOURS AS NEEDED FOR WHEEZE OR SHORTNESS OF BREATH, Disp: 54 each, Rfl: 0   alendronate  (FOSAMAX ) 70 MG tablet, Take 1 tablet (70 mg total) by mouth once a week. Take with a full glass of water  on an empty stomach., Disp: 12 tablet, Rfl: 3   ALPRAZolam  (XANAX ) 1 MG tablet, ! Each morning, one each evening, and 1/2 each afternoon, Disp: 75 tablet,  Rfl: 5   amLODipine  (NORVASC ) 10 MG tablet, Take 1 tablet (10 mg total) by mouth daily., Disp: 90 tablet, Rfl: 0   apixaban  (ELIQUIS ) 5 MG TABS tablet, Take 1 tablet (5 mg total) by mouth 2 (two) times daily., Disp: 180 tablet, Rfl: 3   atorvastatin  (LIPITOR ) 80 MG tablet, Take 1 tablet (80 mg total) by mouth daily., Disp: 90 tablet, Rfl: 0   Bempedoic Acid-Ezetimibe  (NEXLIZET ) 180-10 MG TABS, Take 1 tablet by mouth daily. Take 180 mg by mouth daily., Disp: 90 tablet, Rfl: 3   Blood Glucose Monitoring Suppl (ACCU-CHEK GUIDE) w/Device KIT, Test Bs QID Dx E11.69, Disp: 1 kit, Rfl: 0   cefUROXime  (CEFTIN ) 250 MG tablet, Take 1 tablet (250 mg total) by mouth 2 (two) times daily with a meal for 20 days., Disp: 40 tablet, Rfl: 0   conjugated estrogens  (PREMARIN ) vaginal cream, Use 0.5 gm in vagina nightly for 1 week then 2 x weekly, Disp: 12 g, Rfl: 0   cyclobenzaprine  (FLEXERIL ) 10 MG tablet, Take 1 tablet (10 mg total) by mouth 3 (three) times daily as needed for muscle spasms., Disp: 30 tablet, Rfl: 0   dapagliflozin  propanediol (FARXIGA ) 10 MG TABS tablet, Take 1 tablet (10 mg total) by mouth daily., Disp: 90 tablet, Rfl: 5   diclofenac  Sodium (VOLTAREN ) 1 % GEL, Apply 2 g topically 4 (four) times daily., Disp: 350 g, Rfl: 0   famotidine  (PEPCID ) 20 MG tablet, Take 1 tablet (20 mg total) by mouth at bedtime., Disp: 90 tablet, Rfl: 3   furosemide  (LASIX ) 20 MG tablet, Take 1 tablet (20 mg total) by mouth daily., Disp: 90 tablet, Rfl: 1   oxybutynin  (DITROPAN -XL) 5 MG 24 hr tablet, Take 1 tablet (5 mg total) by mouth at bedtime., Disp: 90 tablet, Rfl: 3   pantoprazole  (PROTONIX ) 40 MG tablet, Take 1 tablet (40 mg total) by mouth daily. For stomach, Disp: 30 tablet, Rfl: 11   pregabalin  (LYRICA ) 150 MG capsule, Take 1 capsule (150 mg total) by mouth at bedtime., Disp: 30 capsule, Rfl: 1   promethazine -dextromethorphan (PROMETHAZINE -DM) 6.25-15 MG/5ML syrup, Take 1.3 mLs by mouth 4 (four) times daily as  needed. (Patient not taking: Reported on 04/16/2024), Disp: 118 mL, Rfl: 0   tirzepatide  (MOUNJARO ) 10 MG/0.5ML Pen, Inject 10 mg into the skin once a week., Disp: 6 mL, Rfl: 3   tirzepatide  (MOUNJARO ) 7.5 MG/0.5ML Pen, Inject 7.5 mg into the skin once a week. Use when 10 is unavailable due to back order, Disp: 6 mL, Rfl: 3  Current Facility-Administered Medications:    promethazine  (PHENERGAN ) injection 25 mg, 25 mg, Intramuscular, Q6H PRN, Gaylyn Keas, Mary-Margaret, FNP, 25 mg at 12/28/22 1316  Observations/Objective: Patient is well-developed, well-nourished in no acute distress.  Resting comfortably  at home.  Head is normocephalic,  atraumatic.  No labored breathing.  Speech is clear and coherent with logical content.  Patient is alert and oriented at baseline.    Assessment and Plan: 1. Diarrhea, unspecified type (Primary)  Increase fluids, BRATT diet, no milk or dairy, UC as needed if sx persist.   Follow Up Instructions: I discussed the assessment and treatment plan with the patient. The patient was provided an opportunity to ask questions and all were answered. The patient agreed with the plan and demonstrated an understanding of the instructions.  A copy of instructions were sent to the patient via MyChart unless otherwise noted below.     The patient was advised to call back or seek an in-person evaluation if the symptoms worsen or if the condition fails to improve as anticipated.    Danon Lograsso, FNP

## 2024-04-18 NOTE — Patient Instructions (Signed)
 Diarrhea, Adult Diarrhea is frequent loose and sometimes watery bowel movements. Diarrhea can make you feel weak and cause you to become dehydrated. Dehydration is a condition in which there is not enough water or other fluids in the body. Dehydration can make you tired and thirsty, cause you to have a dry mouth, and decrease how often you urinate. Diarrhea typically lasts 2-3 days. However, it can last longer if it is a sign of something more serious. It is important to treat your diarrhea as told by your health care provider. Follow these instructions at home: Eating and drinking     Follow these recommendations as told by your health care provider: Take an oral rehydration solution (ORS). This is an over-the-counter medicine that helps return your body to its normal balance of nutrients and water. It is found at pharmacies and retail stores. Drink enough fluid to keep your urine pale yellow. Drink fluids such as water, diluted fruit juice, and low-calorie sports drinks. You can drink milk also, if desired. Sucking on ice chips is another way to get fluids. Avoid drinking fluids that contain a lot of sugar or caffeine, such as soda, energy drinks, and regular sports drinks. Avoid alcohol. Eat bland, easy-to-digest foods in small amounts as you are able. These foods include bananas, applesauce, rice, lean meats, toast, and crackers. Avoid spicy or fatty foods.  Medicines Take over-the-counter and prescription medicines only as told by your health care provider. If you were prescribed antibiotics, take them as told by your health care provider. Do not stop using the antibiotic even if you start to feel better. General instructions  Wash your hands often using soap and water for at least 20 seconds. If soap and water are not available, use hand sanitizer. Others in the household should wash their hands as well. Hands should be washed: After using the toilet or changing a diaper. Before  preparing, cooking, or serving food. While caring for a sick person or while visiting someone in a hospital. Rest at home while you recover. Take a warm bath to relieve any burning or pain from frequent diarrhea episodes. Watch your condition for any changes. Contact a health care provider if: You have a fever. Your diarrhea gets worse. You have new symptoms. You vomit every time you eat or drink. You feel light-headed, dizzy, or have a headache. You have muscle cramps. You have signs of dehydration, such as: Dark urine, very little urine, or no urine. Cracked lips. Dry mouth. Sunken eyes. Sleepiness. Weakness. You have bloody or black stools or stools that look like tar. You have severe pain, cramping, or bloating in your abdomen. Your skin feels cold and clammy. You feel confused. Get help right away if: You have chest pain or your heart is beating very quickly. You have trouble breathing or you are breathing very quickly. You feel extremely weak or you faint. These symptoms may be an emergency. Get help right away. Call 911. Do not wait to see if the symptoms will go away. Do not drive yourself to the hospital. This information is not intended to replace advice given to you by your health care provider. Make sure you discuss any questions you have with your health care provider. Document Revised: 05/01/2022 Document Reviewed: 05/01/2022 Elsevier Patient Education  2024 [employer].

## 2024-04-19 LAB — URINE CULTURE

## 2024-04-21 ENCOUNTER — Telehealth: Payer: Self-pay | Admitting: Family Medicine

## 2024-04-21 NOTE — Telephone Encounter (Signed)
 Called and offered patient a follow up appt but patient states that she is feeling better and she is taking her antibiotic as prescribed. Patient states she has a follow up with [name] next month.  Advised if she had any other episodes or wanted to be seen before then to contact the office. Patient verbalized understanding.

## 2024-04-21 NOTE — Telephone Encounter (Signed)
 Arrange office follow up soon. (She may have to wear Depends)

## 2024-04-21 NOTE — Telephone Encounter (Signed)
 Caller states she is experiencing severe diarrhea since last Tuesday. She lost consciousness on Wednesday and she saw the doctor on Thursday. She had an MRI done that showed she has a severe sinus infection. She is using pepto bismol and is currently not able to stand up without having diarrhea.

## 2024-04-29 ENCOUNTER — Ambulatory Visit: Admitting: Internal Medicine

## 2024-05-13 ENCOUNTER — Ambulatory Visit: Admitting: Family Medicine

## 2024-05-13 ENCOUNTER — Telehealth: Payer: Self-pay

## 2024-05-13 NOTE — Telephone Encounter (Signed)
 Called to reschedule due to provider call out. Unable to lm due to full vm. Only one number on file. LS

## 2024-05-25 ENCOUNTER — Ambulatory Visit: Admitting: Family Medicine

## 2024-05-25 ENCOUNTER — Encounter: Payer: Self-pay | Admitting: Family Medicine

## 2024-05-25 VITALS — BP 139/74 | HR 67 | Temp 98.1°F | Ht 62.0 in | Wt 165.0 lb

## 2024-05-25 DIAGNOSIS — M25551 Pain in right hip: Secondary | ICD-10-CM

## 2024-05-25 DIAGNOSIS — M25552 Pain in left hip: Secondary | ICD-10-CM | POA: Diagnosis not present

## 2024-05-25 DIAGNOSIS — E1122 Type 2 diabetes mellitus with diabetic chronic kidney disease: Secondary | ICD-10-CM | POA: Diagnosis not present

## 2024-05-25 DIAGNOSIS — E782 Mixed hyperlipidemia: Secondary | ICD-10-CM | POA: Diagnosis not present

## 2024-05-25 DIAGNOSIS — N1831 Chronic kidney disease, stage 3a: Secondary | ICD-10-CM

## 2024-05-25 LAB — BAYER DCA HB A1C WAIVED: HB A1C (BAYER DCA - WAIVED): 7.7 % — ABNORMAL HIGH (ref 4.8–5.6)

## 2024-05-25 MED ORDER — CLOPIDOGREL BISULFATE 75 MG PO TABS: 75.0000 mg | ORAL_TABLET | Freq: Every day | ORAL | 3 refills | Status: DC

## 2024-05-25 MED ORDER — ACCU-CHEK GUIDE TEST VI STRP: ORAL_STRIP | 3 refills | Status: AC

## 2024-05-25 MED ORDER — ACCU-CHEK GUIDE W/DEVICE KIT: PACK | 0 refills | Status: DC

## 2024-05-25 MED ORDER — ACCU-CHEK SOFTCLIX LANCETS MISC: 3 refills | Status: AC

## 2024-05-25 NOTE — Progress Notes (Addendum)
 Subjective:  Patient ID: Yvonne [name], female    DOB: 09/23/1951  Age: 73 y.o. MRN: [id number]  CC: Medical Management of Chronic Issues (Left side hip pain. Having to wear a back brace. Hurts sit and walk. No numbness or tingling. )   HPI Yvonne [name] presents forFollow-up of diabetes. Patient checks blood sugar at home.   200 fasting and almost 300 postprandial Admits to stress eating junk food.  Cakes and cookies, etc.  Patient denies symptoms such as polyuria, polydipsia, excessive hunger, nausea No significant hypoglycemic spells noted. Medications reviewed. Pt reports taking them regularly without complication/adverse reaction being reported today.    History Yvonne [name] has a past medical history of (HFpEF) heart failure with preserved ejection fraction (HCC), Anxiety, Aortic atherosclerosis (HCC), Arthritis, Asthma, CAD (coronary artery disease), Carotid artery disease (HCC), Chronic cough, Chronic otitis media (12/2017), Full dentures, GERD (gastroesophageal reflux disease), History of stroke (09/2017), Hyperlipidemia, Hypertension, Non-insulin  dependent type 2 diabetes mellitus (HCC), Orthostatic hypotension, Overactive bladder, PSVT (paroxysmal supraventricular tachycardia) (HCC), Recurrent acute suppurative otitis media without spontaneous rupture of left tympanic membrane (02/19/2018), Stroke (HCC), and Transient cerebral ischemia.   She has a past surgical history that includes Cholecystectomy; Colonoscopy (N/A, 08/17/2015); Abdominal hysterectomy; Lacrimal duct exploration (Bilateral); Cataract extraction w/ intraocular lens implant (Right); Vitrectomy (Left, 09/14/2015); Gas insertion (Right); Myringotomy with tube placement (Bilateral, 01/28/2018); IR ANGIO INTRA EXTRACRAN SEL INTERNAL CAROTID BILAT MOD SED (10/22/2023); and IR ANGIO VERTEBRAL SEL VERTEBRAL UNI L MOD SED (10/22/2023).   Her family history includes Arthritis in her brother and mother; Arthritis-Osteo in her son; Asthma  in her father; Breast cancer in her mother; CVA in her mother; Cancer in her brother; Diabetes in her father and mother; Heart disease in her father; Hepatitis C in her sister; Peripheral Artery Disease in her daughter; Stroke in her mother.She reports that she has never smoked. She has never used smokeless tobacco. She reports that she does not currently use alcohol. She reports that she does not use drugs.  Current Outpatient Medications on File Prior to Visit  Medication Sig Dispense Refill   albuterol  (VENTOLIN  HFA) 108 (90 Base) MCG/ACT inhaler INHALE 2 PUFFS INTO THE LUNGS EVERY 6 HOURS AS NEEDED FOR WHEEZE OR SHORTNESS OF BREATH 54 each 0   alendronate  (FOSAMAX ) 70 MG tablet Take 1 tablet (70 mg total) by mouth once a week. Take with a full glass of water  on an empty stomach. 12 tablet 3   ALPRAZolam  (XANAX ) 1 MG tablet ! Each morning, one each evening, and 1/2 each afternoon 75 tablet 5   amLODipine  (NORVASC ) 10 MG tablet Take 1 tablet (10 mg total) by mouth daily. 90 tablet 0   apixaban  (ELIQUIS ) 5 MG TABS tablet Take 1 tablet (5 mg total) by mouth 2 (two) times daily. 180 tablet 3   atorvastatin  (LIPITOR ) 80 MG tablet Take 1 tablet (80 mg total) by mouth daily. 90 tablet 0   Bempedoic Acid-Ezetimibe  (NEXLIZET ) 180-10 MG TABS Take 1 tablet by mouth daily. Take 180 mg by mouth daily. 90 tablet 3   conjugated estrogens  (PREMARIN ) vaginal cream Use 0.5 gm in vagina nightly for 1 week then 2 x weekly 12 g 0   cyclobenzaprine  (FLEXERIL ) 10 MG tablet Take 1 tablet (10 mg total) by mouth 3 (three) times daily as needed for muscle spasms. 30 tablet 0   dapagliflozin  propanediol (FARXIGA ) 10 MG TABS tablet Take 1 tablet (10 mg total) by mouth daily. 90 tablet 5   famotidine  (  PEPCID ) 20 MG tablet Take 1 tablet (20 mg total) by mouth at bedtime. 90 tablet 3   furosemide  (LASIX ) 20 MG tablet Take 1 tablet (20 mg total) by mouth daily. 90 tablet 1   oxybutynin  (DITROPAN -XL) 5 MG 24 hr tablet Take 1  tablet (5 mg total) by mouth at bedtime. 90 tablet 3   pantoprazole  (PROTONIX ) 40 MG tablet Take 1 tablet (40 mg total) by mouth daily. For stomach 30 tablet 11   pregabalin  (LYRICA ) 150 MG capsule Take 1 capsule (150 mg total) by mouth at bedtime. 30 capsule 1   tirzepatide  (MOUNJARO ) 10 MG/0.5ML Pen Inject 10 mg into the skin once a week. 6 mL 3   diclofenac  Sodium (VOLTAREN ) 1 % GEL Apply 2 g topically 4 (four) times daily. (Patient not taking: Reported on 05/25/2024) 350 g 0   Current Facility-Administered Medications on File Prior to Visit  Medication Dose Route Frequency Provider Last Rate Last Admin   promethazine  (PHENERGAN ) injection 25 mg  25 mg Intramuscular Q6H PRN Gladis, Mary-Margaret, FNP   25 mg at 12/28/22 1316    ROS Review of Systems  Constitutional: Negative.   HENT: Negative.    Eyes:  Negative for visual disturbance.  Respiratory:  Negative for shortness of breath.   Cardiovascular:  Negative for chest pain.  Gastrointestinal:  Negative for abdominal pain.  Musculoskeletal:  Positive for arthralgias (tenderness, posterior left hip for deep palpation) and back pain.    Objective:  BP 139/74   Pulse 67   Temp 98.1 F (36.7 C)   Ht 5' 2 (1.575 m)   Wt 165 lb (74.8 kg)   SpO2 98%   BMI 30.18 kg/m   BP Readings from Last 3 Encounters:  05/25/24 139/74  04/16/24 128/60  03/31/24 134/69    Wt Readings from Last 3 Encounters:  05/25/24 165 lb (74.8 kg)  04/16/24 167 lb (75.8 kg)  03/31/24 169 lb (76.7 kg)    Lab Results  Component Value Date   HGBA1C 7.7 (H) 05/25/2024   HGBA1C 5.9 (H) 02/10/2024   HGBA1C 6.3 (H) 11/11/2023    Physical Exam Constitutional:      General: She is not in acute distress.    Appearance: She is well-developed.  Cardiovascular:     Rate and Rhythm: Normal rate and regular rhythm.  Pulmonary:     Breath sounds: Normal breath sounds.  Musculoskeletal:        General: Normal range of motion.  Skin:    General: Skin is  warm and dry.  Neurological:     Mental Status: She is alert and oriented to person, place, and time.        Assessment & Plan:  Type 2 diabetes mellitus with stage 3a chronic kidney disease, without long-term current use of insulin  (HCC) -     Bayer DCA Hb A1c Waived  Mixed hyperlipidemia -     CMP14+EGFR -     Lipid panel  Pain of both hip joints  Other orders -     Clopidogrel  Bisulfate; Take 1 tablet (75 mg total) by mouth daily.  Dispense: 90 tablet; Refill: 3 -     Accu-Chek Guide; Test Bs QID Dx E11.69  Dispense: 1 kit; Refill: 0 -     Accu-Chek Softclix Lancets; Test BS up to 4 times daily Dx E11.69  Dispense: 400 each; Refill: 3 -     Accu-Chek Guide Test; Use as instructed  Dispense: 400 strip; Refill: 3  Recmmend that she give the pregabalin  a little more time to improve pain relief since the titration continues. Some side effects havve been trailing off.   Follow-up: Return in about 3 months (around 08/25/2024).  Butler Der, M.D.

## 2024-05-26 ENCOUNTER — Ambulatory Visit: Payer: Self-pay | Admitting: Family Medicine

## 2024-05-26 LAB — LIPID PANEL
Chol/HDL Ratio: 3.9 ratio (ref 0.0–4.4)
Cholesterol, Total: 267 mg/dL — ABNORMAL HIGH (ref 100–199)
HDL: 69 mg/dL (ref >39)
LDL Chol Calc (NIH): 177 mg/dL — ABNORMAL HIGH (ref 0–99)
Triglycerides: 119 mg/dL (ref 0–149)
VLDL Cholesterol Cal: 21 mg/dL (ref 5–40)

## 2024-05-26 LAB — CMP14+EGFR
ALT: 13 IU/L (ref 0–32)
AST: 16 IU/L (ref 0–40)
Albumin: 4.2 g/dL (ref 3.8–4.8)
Alkaline Phosphatase: 104 IU/L (ref 44–121)
BUN/Creatinine Ratio: 21 (ref 12–28)
BUN: 19 mg/dL (ref 8–27)
Bilirubin Total: 0.3 mg/dL (ref 0.0–1.2)
CO2: 22 mmol/L (ref 20–29)
Calcium: 10.2 mg/dL (ref 8.7–10.3)
Chloride: 103 mmol/L (ref 96–106)
Creatinine, Ser: 0.91 mg/dL (ref 0.57–1.00)
Globulin, Total: 2.3 g/dL (ref 1.5–4.5)
Glucose: 160 mg/dL — ABNORMAL HIGH (ref 70–99)
Potassium: 4.6 mmol/L (ref 3.5–5.2)
Sodium: 142 mmol/L (ref 134–144)
Total Protein: 6.5 g/dL (ref 6.0–8.5)
eGFR: 66 mL/min/{1.73_m2} (ref >59)

## 2024-05-28 ENCOUNTER — Telehealth: Payer: Self-pay | Admitting: Pharmacist

## 2024-05-28 DIAGNOSIS — Z8673 Personal history of transient ischemic attack (TIA), and cerebral infarction without residual deficits: Secondary | ICD-10-CM

## 2024-05-28 DIAGNOSIS — E782 Mixed hyperlipidemia: Secondary | ICD-10-CM

## 2024-05-28 NOTE — Telephone Encounter (Signed)
 Patient needs appt with [name] for lipid management; history or stroke/diabetes  Lipid Panel     Component Value Date/Time   CHOL 267 (H) 05/25/2024 1122   TRIG 119 05/25/2024 1122   HDL 69 05/25/2024 1122   CHOLHDL 3.9 05/25/2024 1122   CHOLHDL 3.3 01/17/2022 0444   VLDL 11 01/17/2022 0444   LDLCALC 177 (H) 05/25/2024 1122   LABVLDL 21 05/25/2024 1122

## 2024-06-01 ENCOUNTER — Telehealth: Payer: Self-pay

## 2024-06-01 NOTE — Progress Notes (Unsigned)
 Care Guide Pharmacy Note  06/01/2024 Name: KALLY CADDEN MRN: [id number] DOB: [birth year]  Referred By: Zollie Lowers, MD Reason for referral: Complex Care Management (Outreach to schedule with Pharm d )   PALMYRA ROGACKI is a 73 y.o. year old female who is a primary care patient of [name], Lowers, MD.  TAYONNA BACHA was referred to the pharmacist for assistance related to: HLD  An unsuccessful telephone outreach was attempted today to contact the patient who was referred to the pharmacy team for assistance with medication management. Additional attempts will be made to contact the patient.  Jeoffrey Buffalo , RMA     [facility]  [facility], [facility] Guide  Direct Dial: [phone]  Website: [email or web address]

## 2024-06-02 NOTE — Progress Notes (Signed)
 Care Guide Pharmacy Note  06/02/2024 Name: Yvonne [name] MRN: [id number] DOB: 1951-04-05  Referred By: Zollie Lowers, MD Reason for referral: Complex Care Management (Outreach to schedule with Pharm d )   Yvonne [name] is a 73 y.o. year old female who is a primary care patient of [name], Lowers, MD.  Yvonne [name] was referred to the pharmacist for assistance related to: HLD  Successful contact was made with the patient to discuss pharmacy services including being ready for the pharmacist to call at least 5 minutes before the scheduled appointment time and to have medication bottles and any blood pressure readings ready for review. The patient agreed to meet with the pharmacist via telephone visit on (date/time).06/23/2024  Jeoffrey Buffalo , RMA     [place]  [facility], [facility] Guide  Direct Dial: [phone]  Website: [email or web address]

## 2024-06-02 NOTE — Progress Notes (Signed)
 Care Guide Pharmacy Note  06/02/2024 Name: Yvonne [name] MRN: [id number] DOB: 07-11-1951  Referred By: Zollie Lowers, MD Reason for referral: Complex Care Management (Outreach to schedule with Pharm d )   Yvonne [name] is a 73 y.o. year old female who is a primary care patient of [name], Lowers, MD.  Yvonne [name] was referred to the pharmacist for assistance related to: HLD  A second unsuccessful telephone outreach was attempted today to contact the patient who was referred to the pharmacy team for assistance with medication management. Additional attempts will be made to contact the patient.  Jeoffrey Buffalo , RMA     [facility]  [facility], [facility] Guide  Direct Dial: [phone]  Website: [email or web address]

## 2024-06-06 NOTE — Addendum Note (Signed)
 Addended by: ZOLLIE LOWERS on: 06/06/2024 01:25 PM   Modules accepted: Orders

## 2024-06-07 ENCOUNTER — Other Ambulatory Visit: Payer: Self-pay | Admitting: Family Medicine

## 2024-06-07 DIAGNOSIS — Z79899 Other long term (current) drug therapy: Secondary | ICD-10-CM

## 2024-06-08 ENCOUNTER — Ambulatory Visit: Admitting: Family Medicine

## 2024-06-08 ENCOUNTER — Ambulatory Visit: Payer: Self-pay | Admitting: Family Medicine

## 2024-06-08 NOTE — Telephone Encounter (Signed)
 Apt today. LS

## 2024-06-08 NOTE — Telephone Encounter (Signed)
 FYI Only or Action Required?: FYI only for provider.  Patient was last seen in primary care on 05/25/2024 by Zollie Lowers, MD.  Called Nurse Triage reporting Groin Swelling.  Symptoms began several days ago.  Symptoms are: unchanged.  Triage Disposition: See HCP Within 4 Hours (Or PCP Triage)  Patient/caregiver understands and will follow disposition?: Yes                      Copied from CRM [id number]. Topic: Clinical - Red Word Triage >> Jun 08, 2024  8:42 AM Yvonne [name] wrote: Red Word that prompted transfer to Nurse Triage: Patient states that she is having severe pain in her vaginal area. The area is red, swollen, and itching. Patient does not know why. Patient thinks that it may be an allergic reaction. Reason for Disposition  [1] SEVERE vaginal pain AND [2] not improved 2 hours after pain medicine  Answer Assessment - Initial Assessment Questions Patient scheduled for an appointment today with PCP in office.    SYMPTOM: What's the main symptom you're concerned about? (e.g., pain, itching, dryness)     Pain, itching, swelling, dryness LOCATION:      Vaginal area ONSET:      Four days ago  PAIN: Is there any pain? If Yes, ask: How bad is it? (Scale: 1-10; mild, moderate, severe)     8/10 pain ITCHING: Is there any itching? If Yes, ask: How bad is it? (Scale: 1-10; mild, moderate, severe)     10/10 CAUSE: What do you think is causing the discharge? Have you had the same problem before? What happened then?     Not sure what is causing it; never happened before; no discharge OTHER SYMPTOMS: Do you have any other symptoms? (e.g., fever, itching, vaginal bleeding, pain with urination, injury to genital area, vaginal foreign body)     Denies fever, vaginal bleeding, pain with urination  Protocols used: Vaginal Symptoms-A-AH

## 2024-06-09 ENCOUNTER — Ambulatory Visit: Admitting: Nurse Practitioner

## 2024-06-09 ENCOUNTER — Encounter: Payer: Self-pay | Admitting: Nurse Practitioner

## 2024-06-09 ENCOUNTER — Ambulatory Visit: Payer: Self-pay | Admitting: Nurse Practitioner

## 2024-06-09 VITALS — BP 159/74 | Temp 97.9°F | Ht 62.0 in | Wt 166.0 lb

## 2024-06-09 DIAGNOSIS — B3731 Acute candidiasis of vulva and vagina: Secondary | ICD-10-CM

## 2024-06-09 DIAGNOSIS — N898 Other specified noninflammatory disorders of vagina: Secondary | ICD-10-CM

## 2024-06-09 LAB — WET PREP FOR TRICH, YEAST, CLUE
Clue Cell Exam: NEGATIVE
Trichomonas Exam: NEGATIVE
Yeast Exam: POSITIVE — AB

## 2024-06-09 MED ORDER — FLUCONAZOLE 150 MG PO TABS: ORAL_TABLET | ORAL | 0 refills | Status: DC

## 2024-06-09 NOTE — Patient Instructions (Signed)

## 2024-06-09 NOTE — Progress Notes (Signed)
 Subjective:    Patient ID: Yvonne [name], female    DOB: [birth year], 73 y.o.   MRN: [id number]  Chief Complaint: Vaginal itching and burning   HPI  Patient in c/o vaginal itching and burning. Started several days ago. With discharge. Patient Active Problem List   Diagnosis Date Noted   Cough productive of purulent sputum 11/04/2023   Nasal congestion 11/04/2023   Sore throat 11/04/2023   Acute cough 11/04/2023   Long-term current use of injectable noninsulin antidiabetic medication 08/12/2023   Pain of upper abdomen 12/05/2022   S/P hysterectomy with oophorectomy 11/23/2022   SUI (stress urinary incontinence, female) 11/23/2022   Dyspareunia in female 11/23/2022   Depression, major, single episode, severe (HCC) 04/10/2022   Peripheral edema 04/10/2022   History of stroke 01/24/2022   Coronary artery disease involving native coronary artery of native heart without angina pectoris 01/24/2022   TIA (transient ischemic attack) 01/18/2022   Type 2 diabetes mellitus with stage 3a chronic kidney disease, without long-term current use of insulin  (HCC) 01/17/2022   Sensory disturbance 01/17/2022   Syncope and collapse 01/16/2022   Restless legs 02/27/2021   Insomnia 02/27/2021   Polyneuropathy due to type 2 diabetes mellitus (HCC) 02/27/2021   Palpitations 01/20/2021   Aortic atherosclerosis (HCC) 01/20/2021   Orthostatic hypotension 01/20/2021   Carpal tunnel syndrome 08/23/2020   Diabetic lipidosis (HCC) 09/09/2019   Hypokalemia 12/11/2018   Benzodiazepine dependence, continuous (HCC) 10/22/2018   Chronic migraine without aura without status migrainosus, not intractable 02/19/2018   (HFpEF) heart failure with preserved ejection fraction (HCC) 03/21/2017   Chronic bilateral low back pain with bilateral sciatica 02/27/2017   Spondylosis of lumbar region without myelopathy or radiculopathy 08/15/2016   Pain of both hip joints 06/18/2016   Pain syndrome, chronic 06/18/2016    Chronic cough 05/17/2016   HLD (hyperlipidemia) 11/04/2015   Macular hole of right eye 09/14/2015   Obesity 07/20/2015   Osteopenia 07/20/2015   Precordial pain 07/06/2015   Overactive bladder    Hypertension    GAD (generalized anxiety disorder)    Acid reflux disease 08/17/2014       Review of Systems  Constitutional:  Negative for diaphoresis.  Eyes:  Negative for pain.  Respiratory:  Negative for shortness of breath.   Cardiovascular:  Negative for chest pain, palpitations and leg swelling.  Gastrointestinal:  Negative for abdominal pain.  Endocrine: Negative for polydipsia.  Skin:  Negative for rash.  Neurological:  Negative for dizziness, weakness and headaches.  Hematological:  Does not bruise/bleed easily.  All other systems reviewed and are negative.      Objective:   Physical Exam Constitutional:      Appearance: Normal appearance.  Cardiovascular:     Rate and Rhythm: Normal rate and regular rhythm.     Heart sounds: Normal heart sounds.  Pulmonary:     Breath sounds: Normal breath sounds.  Genitourinary:    General: Normal vulva.     Vagina: Vaginal discharge ([place] heese appearing) present.  Skin:    General: Skin is warm and dry.  Neurological:     General: No focal deficit present.     Mental Status: She is oriented to person, place, and time.  Psychiatric:        Mood and Affect: Mood normal.        Behavior: Behavior normal.    BP (!) 159/74   Temp 97.9 F (36.6 C) (Temporal)   Ht 5' 2 (1.575 m)  Wt 166 lb (75.3 kg)   BMI 30.36 kg/m         Assessment & Plan:   [name] in today with chief complaint of Vaginal itching and burning   1. Vaginal irritation (Primary) - WET PREP FOR TRICH, YEAST, CLUE  2. Vaginal candidiasis Avoid bubble baths Void after intercourse - fluconazole  (DIFLUCAN ) 150 MG tablet; 1 po q week x 4 weeks  Dispense: 4 tablet; Refill: 0    The above assessment and management plan was discussed with  the patient. The patient verbalized understanding of and has agreed to the management plan. Patient is aware to call the clinic if symptoms persist or worsen. Patient is aware when to return to the clinic for a follow-up visit. Patient educated on when it is appropriate to go to the emergency department.   Mary-Margaret Gladis, FNP

## 2024-06-11 ENCOUNTER — Ambulatory Visit: Admitting: Internal Medicine

## 2024-06-23 ENCOUNTER — Telehealth: Payer: Self-pay

## 2024-06-23 ENCOUNTER — Other Ambulatory Visit (HOSPITAL_COMMUNITY): Payer: Self-pay

## 2024-06-23 ENCOUNTER — Other Ambulatory Visit: Admitting: Pharmacist

## 2024-06-23 DIAGNOSIS — G720 Drug-induced myopathy: Secondary | ICD-10-CM

## 2024-06-23 DIAGNOSIS — G459 Transient cerebral ischemic attack, unspecified: Secondary | ICD-10-CM

## 2024-06-23 DIAGNOSIS — E1122 Type 2 diabetes mellitus with diabetic chronic kidney disease: Secondary | ICD-10-CM

## 2024-06-23 DIAGNOSIS — N1831 Chronic kidney disease, stage 3a: Secondary | ICD-10-CM

## 2024-06-23 DIAGNOSIS — E782 Mixed hyperlipidemia: Secondary | ICD-10-CM

## 2024-06-23 MED ORDER — REPATHA SURECLICK 140 MG/ML ~~LOC~~ SOAJ: 140.0000 mg | SUBCUTANEOUS | 3 refills | Status: DC

## 2024-06-23 MED ORDER — DAPAGLIFLOZIN PROPANEDIOL 10 MG PO TABS: 10.0000 mg | ORAL_TABLET | Freq: Every day | ORAL | 5 refills | Status: DC

## 2024-06-23 NOTE — Telephone Encounter (Signed)
Pt notified.    LS

## 2024-06-23 NOTE — Telephone Encounter (Signed)
 Pharmacy Patient Advocate Encounter  Received notification from [employer] that Prior Authorization for Repatha  Sure Click 140 has been APPROVED from 11/27/23 to 11/25/24. Ran test claim, Copay is $0.00. This test claim was processed through [facility]- copay amounts may vary at other pharmacies due to pharmacy/plan contracts, or as the patient moves through the different stages of their insurance plan.   PA #/Case ID/Reference #: [id number]

## 2024-06-23 NOTE — Telephone Encounter (Signed)
 Pt made aware

## 2024-06-23 NOTE — Progress Notes (Signed)
 06/23/2024 Name: Yvonne [name] MRN: [id number] DOB: 12-25-1950  Chief Complaint  Patient presents with   Hyperlipidemia    Yvonne [name] is [name] 73 y.o. year old female who presented for [name] telephone visit.  I connected with  Yvonne [name] on 06/23/24 by telephone and verified that I am speaking with the correct person using two identifiers.  I discussed the limitations of evaluation and management by telemedicine. The patient expressed understanding and agreed to proceed.  Patient was located in her home and [name] in PCP office during this visit.   They were referred to the pharmacist by their PCP for assistance in managing hyperlipidemia.    Subjective:  59 yoF with history of TIA x3 living with Type 2 diabetes was referred to pharmacy clinic for medication management (focusing on hyperlipidemia).  She admits she has splurged on sweets and foods high in cholesterol.  Care Team: Primary Care Provider: Zollie Lowers, MD ; Next Scheduled Visit: 08/25/24   Medication Access/Adherence  Current Pharmacy:  [facility] [phone] - [place], [place] - [address] [place] [address] Phone: [phone] Fax: [phone]  Patient reports affordability concerns with their medications: No  Patient reports access/transportation concerns to their pharmacy: No  Patient reports adherence concerns with their medications:  Yes  often forgets Uses weekly pill box, but admits to missing multiple doses of her medications weekly  Hyperlipidemia/ASCVD Risk Reduction  Current lipid lowering medications: atorvastatin , Nexlizet  Medications tried in the past: ezetimibe  plain Goal LDL <55 given multiple stroke/cardiac history  Antiplatelet regimen: clopidogrel , eliquis   ASCVD History: TIAx3 Family History: n/[name] Risk Factors: T2DM, HTN  Current physical activity: gets [name] lot of steps in/active  Current medication access support: [employer] for cholesterol  medications  Clinical ASCVD: Yes  The ASCVD Risk score (Arnett DK, et al., 2019) failed to calculate for the following reasons:   Risk score cannot be calculated because patient has [name] medical history suggesting prior/existing ASCVD   Objective:  Lab Results  Component Value Date   HGBA1C 7.7 (H) 05/25/2024    Lab Results  Component Value Date   CREATININE 0.91 05/25/2024   BUN 19 05/25/2024   NA 142 05/25/2024   K 4.6 05/25/2024   CL 103 05/25/2024   CO2 22 05/25/2024    Lab Results  Component Value Date   CHOL 267 (H) 05/25/2024   HDL 69 05/25/2024   LDLCALC 177 (H) 05/25/2024   TRIG 119 05/25/2024   CHOLHDL 3.9 05/25/2024   Medications Reviewed Today     Reviewed by Yvonne [name], [place] [name] (Pharmacist) on 06/23/24 at 1119  Med List Status: <None>   Medication Order Taking? Sig Documenting Provider Last Dose Status Informant  Accu-Chek Softclix Lancets lancets [id number]  Test BS up to 4 times daily Dx E11.69 Yvonne Lowers, MD  Active   albuterol  (VENTOLIN  HFA) 108 (90 Base) MCG/ACT inhaler [id number]  INHALE 2 PUFFS INTO THE LUNGS EVERY 6 HOURS AS NEEDED FOR WHEEZE OR SHORTNESS OF Yvonne [name] Yvonne Lowers, MD  Active   alendronate  (FOSAMAX ) 70 MG tablet [id number]  Take 1 tablet (70 mg total) by mouth once [name] week. Take with [name] full glass of water  on an empty stomach. Yvonne Lowers, MD  Active   ALPRAZolam  (XANAX ) 1 MG tablet [id number]  ! Each morning, one each evening, and 1/2 each afternoon Yvonne Lowers, MD  Active   amLODipine  (NORVASC ) 10 MG tablet [id number]  Take 1 tablet (10 mg total)  by mouth daily. Yvonne Lowers, MD  Active   apixaban  (ELIQUIS ) 5 MG TABS tablet [id number]  Take 1 tablet (5 mg total) by mouth 2 (two) times daily. Yvonne Lowers, MD  Active   atorvastatin  (LIPITOR ) 80 MG tablet [id number]  Take 1 tablet (80 mg total) by mouth daily. Yvonne Lowers, MD  Active     Discontinued 06/23/24 1115 (Change in therapy)   Blood Glucose Monitoring Suppl (ACCU-CHEK  GUIDE) w/Device KIT [id number]  Test Bs QID Dx E11.69 Yvonne Lowers, MD  Active   clopidogrel  (PLAVIX ) 75 MG tablet [id number]  Take 1 tablet (75 mg total) by mouth daily. Yvonne Lowers, MD  Active   conjugated estrogens  (PREMARIN ) vaginal cream [id number]  Use 0.5 gm in vagina nightly for 1 week then 2 x weekly Yvonne Nest A, NP  Active   cyclobenzaprine  (FLEXERIL ) 10 MG tablet [id number]  Take 1 tablet (10 mg total) by mouth 3 (three) times daily as needed for muscle spasms. Dettinger, Fonda LABOR, MD  Active   dapagliflozin  propanediol (FARXIGA ) 10 MG TABS tablet [id number]  Take 1 tablet (10 mg total) by mouth daily. Yvonne Lowers, MD  Active   diclofenac  Sodium (VOLTAREN ) 1 % GEL [id number]  Apply 2 g topically 4 (four) times daily. Dettinger, Fonda LABOR, MD  Active   Evolocumab  (REPATHA  SURECLICK) 140 MG/ML SOAJ [id number] Yes Inject 140 mg into the skin every 14 (fourteen) days. Yvonne Lowers, MD  Active   famotidine  (PEPCID ) 20 MG tablet [id number]  Take 1 tablet (20 mg total) by mouth at bedtime. Yvonne Lowers, MD  Active   fluconazole  (DIFLUCAN ) 150 MG tablet [id number]  1 po q week x 4 weeks Yvonne Mustard, FNP  Active   furosemide  (LASIX ) 20 MG tablet [id number]  Take 1 tablet (20 mg total) by mouth daily. Yvonne Lowers, MD  Active   glucose blood (ACCU-CHEK GUIDE TEST) test strip [id number]  Use as instructed Yvonne Lowers, MD  Active   oxybutynin  (DITROPAN -XL) 5 MG 24 hr tablet [id number]  Take 1 tablet (5 mg total) by mouth at bedtime. Yvonne Lowers, MD  Active   pantoprazole  (PROTONIX ) 40 MG tablet [id number]  Take 1 tablet (40 mg total) by mouth daily. For stomach Yvonne Lowers, MD  Active   pregabalin  (LYRICA ) 150 MG capsule [id number]  Take 1 capsule (150 mg total) by mouth at bedtime. Yvonne Lowers, MD  Active   promethazine  (PHENERGAN ) injection 25 mg [id number]   Yvonne Mustard, FNP  Active   tirzepatide  (MOUNJARO ) 10 MG/0.5ML Pen [id number]  Inject 10 mg into the skin  once [name] week. Yvonne Lowers, MD  Active             Assessment/Plan:   Hyperlipidemia/ASCVD Risk Reduction: - Currently uncontrolled. Current LDL is 177, Goal LDL <55 given multiple stroke/cardiac history. Patient reports non-compliance, but we suggested she leave medications at the bedside with water  to remind patient to take when she wakes up. - Reviewed long term complications of uncontrolled cholesterol - Reviewed dietary recommendations including FOLLOWING [name] HEART HEALTHY DIET/HEALTHY PLATE METHOD - Reviewed lifestyle recommendations including increase physical activity as able - Recommend to : START Repatha  Continue atorvastatin  Discontinue Nexlizet  - Instructed patient to use healthwell grant copay card for Repatha  when she picks up from the pharmacy   Follow Up Plan: 1 month [name], PCP 07/2024  Mliss Tarry Griffin, [name], BCACP, CPP Clinical Pharmacist, [facility]

## 2024-06-23 NOTE — Telephone Encounter (Signed)
 Pharmacy Patient Advocate Encounter   Received notification from CoverMyMeds that prior authorization for Repatha  Sure Click 140 is required/requested.   Insurance verification completed.   The patient is insured through [employer] .   Per test claim: PA required; PA submitted to above mentioned insurance via CoverMyMeds Key/confirmation #/EOC [facility] Status is pending

## 2024-06-26 DIAGNOSIS — Z8673 Personal history of transient ischemic attack (TIA), and cerebral infarction without residual deficits: Secondary | ICD-10-CM | POA: Diagnosis not present

## 2024-06-26 DIAGNOSIS — M940 Chondrocostal junction syndrome [Tietze]: Secondary | ICD-10-CM | POA: Diagnosis not present

## 2024-06-26 DIAGNOSIS — K219 Gastro-esophageal reflux disease without esophagitis: Secondary | ICD-10-CM | POA: Diagnosis not present

## 2024-06-26 DIAGNOSIS — Z7984 Long term (current) use of oral hypoglycemic drugs: Secondary | ICD-10-CM | POA: Diagnosis not present

## 2024-06-26 DIAGNOSIS — Z7901 Long term (current) use of anticoagulants: Secondary | ICD-10-CM | POA: Diagnosis not present

## 2024-06-26 DIAGNOSIS — R079 Chest pain, unspecified: Secondary | ICD-10-CM | POA: Diagnosis not present

## 2024-06-26 DIAGNOSIS — E7800 Pure hypercholesterolemia, unspecified: Secondary | ICD-10-CM | POA: Diagnosis not present

## 2024-06-26 DIAGNOSIS — I10 Essential (primary) hypertension: Secondary | ICD-10-CM | POA: Diagnosis not present

## 2024-06-26 DIAGNOSIS — E119 Type 2 diabetes mellitus without complications: Secondary | ICD-10-CM | POA: Diagnosis not present

## 2024-06-28 ENCOUNTER — Other Ambulatory Visit: Payer: Self-pay | Admitting: Nurse Practitioner

## 2024-06-28 ENCOUNTER — Other Ambulatory Visit: Payer: Self-pay | Admitting: Family Medicine

## 2024-06-29 ENCOUNTER — Ambulatory Visit: Payer: Self-pay | Admitting: *Deleted

## 2024-06-29 DIAGNOSIS — I10 Essential (primary) hypertension: Secondary | ICD-10-CM | POA: Diagnosis not present

## 2024-06-29 DIAGNOSIS — Z8673 Personal history of transient ischemic attack (TIA), and cerebral infarction without residual deficits: Secondary | ICD-10-CM | POA: Diagnosis not present

## 2024-06-29 DIAGNOSIS — Z1330 Encounter for screening examination for mental health and behavioral disorders, unspecified: Secondary | ICD-10-CM | POA: Diagnosis not present

## 2024-06-29 DIAGNOSIS — R072 Precordial pain: Secondary | ICD-10-CM | POA: Diagnosis not present

## 2024-06-29 NOTE — Telephone Encounter (Signed)
 First attempt to return her call.   Left a voicemail to call back.  Any nurse can assist her.  Routed to triage basket for future attempts.

## 2024-06-29 NOTE — Telephone Encounter (Signed)
 Message from [place] S sent at 06/29/2024  8:16 AM EDT  Patient called in with complaints of bites on both her legs. Patient first noticed the bites on Saturday 08/02 after she finished cleaning out her dog pin. She states that she has red puffy bite marks on both her legs with the left leg being the worse. She also, has complaints of severe itching from the bite marks. In addition, patient was in the ER on Friday 08/01 for a different reason. Patient sees Dr. Zollie at [facility]. Please contact patient at [phone].    Call History  Contact Date/Time Type Contact Phone/Fax By  06/29/2024 08:06 AM EDT Phone (Incoming) Yvonne [name], Yvonne [name] (Self) [phone] Jarold Cherylann RAMAN

## 2024-06-29 NOTE — Telephone Encounter (Signed)
 Pt has appt

## 2024-06-29 NOTE — Telephone Encounter (Signed)
 2nd attempt to return her call.   Left a voicemail to call back and any nurse can assist.  Routed to call back queue for future attempts.

## 2024-06-29 NOTE — Telephone Encounter (Signed)
 FYI Only or Action Required?: FYI only for provider.  Patient was last seen in primary care on 06/09/2024 by [name], FNP.  Called Nurse Triage reporting Insect Bite.  Symptoms began several days ago.  Interventions attempted: OTC medications: Benadryl , hydrocortisone cream.  Symptoms are: unchanged.  Triage Disposition: See PCP When Office is Open (Within 3 Days)  Patient/caregiver understands and will follow disposition?:yes   Reason for Disposition  [1] SEVERE local itching (e.g., interferes with work, school, sleep) AND [2] not improved after 24 hours of hydrocortisone cream  Answer Assessment - Initial Assessment Questions ( Patient states she was seen at ED recently- but not for this and she is on her way for her f/u appointment with cardiology now.)  1. TYPE of INSECT: What type of insect was it?      Not sure- was working near Nurse, mental health lots 2. ONSET: When did you get bitten?      Saturday afternoon- working in yard 3. LOCATION: Where is the insect bite located?      Both legs- buttock down 4. REDNESS: Is the area red or pink? If Yes, ask: What size is the area of redness? (inches or cm). When did the redness start?     Red bump-tiny to larger 5. PAIN: Is there any pain? If Yes, ask: How bad is the pain? (Scale 0-10; or none, mild, moderate, severe)     no 6. ITCHING: Does it itch? If Yes, ask: How bad is the itch?      Yes- severe 7. SWELLING: How big is the swelling? (e.g., inches, cm, or compare to coins)     Tiny to larger 8. OTHER SYMPTOMS: Do you have any other symptoms?  (e.g., difficulty breathing, fever, hives)     no  Protocols used: Insect Bite-A-AH

## 2024-06-29 NOTE — Progress Notes (Signed)
 Chief Complaint  Patient presents with  . Chest Pain   Butler JONETTA Der, MD  HPI: Yvonne [name] is a 73 year old female with history of COPD. 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 She is followed by Dr. Isabell.     Echocardiogram in 2019: Normal LVEF, moderate concentric hypertrophy.  Nuclear stress test in 2014 was negative for ischemia.  She was admitted to [facility] in [place] with a TIA, tingling and numbness in her hands, dizziness. CTA head and neck was negative for large vessel occlusion or other emergent findings however there was atheromatous plaque in her carotid arteries with mild narrowing slightly worse on the left. Atheromatous plaque within the distal left V4 segment. MRI of her brain demonstrated no acute findings. CT head demonstrated small remote infarct in the left thalamus.  She was started on Eliquis  and aspirin  and Plavix  were discontinued by her PCP.   Cardiac event monitor 07/2023 revealed no atrial fibrillation or other significant arrhythmias.    She presents today for ED follow-up. She was recently seen in the ED at [facility] in [place] with chest pain. She reported left sided chest pain with radiation down her left arm. EKG was nonischemic and troponin normal x 2. She has had some recurrent chest discomfort but no recurrent left arm pain. No dyspnea, orthopnea, PND or edema. Blood pressure is well controlled.     Patient's Medications       * Accurate as of June 29, 2024  2:17 PM. Reflects encounter med changes as of last refresh          Continued Medications      Instructions  ACCU-CHEK AVIVA PLUS test strip Generic drug: glucose blood  TEST ONCE DAILY.   * albuterol  sulfate 2.5 mg/3 mL nebulizer solution Commonly known as: PROVENTIL   2.5 mg, Every 6 hours as needed   * albuterol  sulfate HFA 108 (90 Base) MCG/ACT inhaler Commonly known as: PROAIR  HFA  2 puffs, Inhalation, Every 6 hours as needed   alendronate  70 mg  tablet Commonly known as: FOSAMAX   TAKE 1 TABLET EVERY 7 DAYS WITH A FULL GLASS OF WATER  ON AN EMPTY STOMACH   ALPRAZolam  1 mg tablet Commonly known as: XANAX   TAKE (1) TABLET THREE TIMES DAILY.   amLODIPine  besylate 5 mg tablet Commonly known as: NORVASC   5 mg, Oral, 2 times a day   apixaban  5 mg tablet Commonly known as: ELIQUIS   5 mg   atorvastatin  40 mg tablet Commonly known as: LIPITOR   40 mg, At bedtime   BREO ELLIPTA  100-25 mcg/inh inhalation powder Generic drug: fluticasone  furoate-vilanterol    clopidogrel  bisulfate 75 mg tablet Commonly known as: PLAVIX   Take by mouth.   colestipol 1 g tablet Commonly known as: COLESTID  1 g, Oral, 2 times a day   cyanocobalamin  500 MCG tablet  500 mcg, Daily   dapagliflozin  10 mg tablet Commonly known as: FARXIGA   10 mg, Daily   diclofenac  sodium 1 % gel Commonly known as: VOLTAREN   4 g   famotidine  20 mg tablet Commonly known as: PEPCID   20 mg, At bedtime   furosemide  20 mg tablet Commonly known as: LASIX     loratadine  10 MG tablet Commonly known as: CLARITIN   10 mg, Oral, Daily   MOUNJARO  10 MG/0.5ML Soaj injection Generic drug: tirzepatide   10 mg   naloxone nasal spray (TAKE HOME PACK) Commonly known as: NARCAN  1 spray, Nasal, Once as needed  omeprazole  20 mg capsule Commonly known as: PRILOSEC  1 capsule, Daily   pantoprazole  sodium 40 mg tablet Commonly known as: PROTONIX   pantoprazole  40 mg tablet,delayed release   REPATHA  SURECLICK 140 MG/ML Soaj Pen Generic drug: evolocumab   140 mg, Every 14 days      * * This list has 2 medication(s) that are the same as other medications prescribed for you. Read the directions carefully, and ask your doctor or other care provider to review them with you.          Discontinued Medications    Black Cohosh 20 MG Tabs Stopped by: Megan Hulen   ciprofloxacin  0.3% ophthalmic solution Commonly known as: CILOXAN  Stopped by: Megan Hulen    cyclobenzaprine  10 mg tablet Commonly known as: FLEXERIL  Stopped by: Megan Hulen   diazepam  5 mg tablet Commonly known as: VALIUM  Stopped by: Megan Hulen   EFFEXOR  XR 37.5 mg 24 hr capsule Generic drug: venlafaxine  HCl Stopped by: Megan Hulen   ezetimibe  10 MG tablet Commonly known as: ZETIA  Stopped by: Megan Hulen   fexofenadine  180 mg tablet Commonly known as: ALLEGRA  Stopped by: Megan Hulen   gabapentin  300 mg capsule Commonly known as: NEURONTIN  Stopped by: Megan Hulen   glipiZIDE  5 mg tablet Commonly known as: GLUCOTROL  Stopped by: Megan Hulen   hyoscyamine  sulfate 0.125 mg SL tablet Commonly known as: LEVSIN /SL Stopped by: Megan Hulen   ibuprofen 800 mg tablet Commonly known as: ADVIL,MOTRIN Stopped by: Megan Hulen   ipratropium-albuterol  0.5-2.5 mg/3 mL ML Soln nebulizer solution Commonly known as: DUONEB Stopped by: Megan Hulen   LANCETS ULTRA THIN Misc Stopped by: Megan Hulen   metFORMIN  ER 500 mg 24 hr tablet Commonly known as: GLUCOPHAGE -XR Stopped by: Megan Hulen   metoprolol  succinate 50 mg 24 hr tablet Commonly known as: TOPROL -XL Stopped by: Megan Hulen   Misc. Devices Misc Stopped by: Megan Hulen   NEXLIZET  180-10 MG Tabs Generic drug: Bempedoic Acid-Ezetimibe  Stopped by: Megan Hulen   nitroGLYCERIN  0.4 mg SL tablet Commonly known as: NITROSTAT  Stopped by: Megan Hulen   oxybutynin  5 MG tablet Commonly known as: DITROPAN  Stopped by: Megan Hulen   prednisoLONE acetate 1% ophthalmic suspension Commonly known as: PRED FORTE,ECONOPRED Stopped by: Megan Hulen   PREMARIN  vaginal cream Generic drug: conjugated estrogens  Stopped by: Megan Hulen   solifenacin  succinate 10 mg tablet Commonly known as: VESICARE  Stopped by: Megan Hulen   topiramate  25 MG tablet Commonly known as: TOPAMAX  Stopped by: Megan Hulen   triamcinolone  0.5% ointment Commonly known as: KENALOG  Stopped by: Megan Hulen        Allergies[1]  OBJECTIVE: Physical Exam: BP 138/78   Pulse 67   Resp 18   Ht 5' 2 (1.575 m)   Wt 165 lb (74.8 kg)   SpO2 96%   BMI 30.18 kg/m   Gen: well developed, well nourished, No acute distress Neck: No JVD, no Bruits, normal carotid upstroke. No thyroidmegally Lungs: Clear to auscultation bilaterally. No wheezes or rales noted Heart: RRR without murmurs. No rubs. No gallups.  Abdomen: soft, non-tender Extr: 2+ radial/DP bilaterally. No lower extremity edema  Lab Results  Component Value Date   Cholesterol, Total 270 (H) 04/20/2014   Lab Results  Component Value Date   HDL 62 04/20/2014   Lab Results  Component Value Date   LDL Calculated 180 (H) 04/20/2014   Lab Results  Component Value Date   Triglycerides 142 04/20/2014   No results found for: CHOLHDL    PROBLEM  LIST/ASSESSMENT/IMPRESSION/DISCUSSION:   Patient Active Problem List   Diagnosis Date Noted  . Chronic bilateral low back pain with bilateral sciatica 02/27/2017  . Spondylosis of lumbar region without myelopathy or radiculopathy 08/15/2016  . Pain syndrome, chronic 06/18/2016  . Pain in both feet 06/18/2016  . Pain of both hip joints 06/18/2016  . Macular hole of right eye 09/14/2015  . GERD (gastroesophageal reflux disease) 08/17/2014  . Edema 08/17/2014  . OA (osteoarthritis of spine) 11/27/2012  . Overactive bladder   . DM (diabetes mellitus) (*) 06/17/2012  . Anxiety 06/17/2012  . Asthma (*) 06/17/2012  . HTN (hypertension) 06/17/2012  . Back pain 06/17/2012  . Migraine 06/17/2012    1. Precordial pain   2. Primary hypertension   3. History of stroke       PLAN/RECOMMENDATIONS:  Chest pain: The patient has risk factors for coronary disease and has not had an ischemic evaluation since 2014. I recommend further evaluation with a Lexiscan  Cardiolite . Hypertension: Blood pressure is stable on current regimen.   Return for follow-up with Dr. Isabell in 2-3 months.  Megan  Hulen, AGNP-C   Cc: Butler JONETTA Der, MD       [1] Allergies Allergen Reactions  . Penicillins Hives and Itching    Has patient had a PCN reaction causing immediate rash, facial/tongue/throat swelling, SOB or lightheadedness with hypotension: Yes Has patient had a PCN reaction causing severe rash involving mucus membranes or skin necrosis: No Has patient had a PCN reaction that required hospitalization No Has patient had a PCN reaction occurring within the last 10 years: No If all of the above answers are NO, then may proceed with Cephalosporin use.  . Metformin  And Related Diarrhea    dizziness  . Pregabalin  Other  . Lisinopril Cough    HAS A CHRONIC COUGH AND DID NOT NOTICE IMPROVEMENT OF COUGH WHEN LISINOPRIL WAS STOPPED  *Some images could not be shown.

## 2024-06-30 ENCOUNTER — Encounter: Payer: Self-pay | Admitting: Family Medicine

## 2024-06-30 ENCOUNTER — Ambulatory Visit (INDEPENDENT_AMBULATORY_CARE_PROVIDER_SITE_OTHER): Admitting: Family Medicine

## 2024-06-30 VITALS — BP 128/74 | HR 110 | Temp 97.4°F | Ht 62.0 in | Wt 166.2 lb

## 2024-06-30 DIAGNOSIS — W57XXXA Bitten or stung by nonvenomous insect and other nonvenomous arthropods, initial encounter: Secondary | ICD-10-CM | POA: Diagnosis not present

## 2024-06-30 DIAGNOSIS — S70369A Insect bite (nonvenomous), unspecified thigh, initial encounter: Secondary | ICD-10-CM | POA: Diagnosis not present

## 2024-06-30 MED ORDER — BETAMETHASONE SOD PHOS & ACET 6 (3-3) MG/ML IJ SUSP
6.0000 mg | Freq: Once | INTRAMUSCULAR | Status: AC
  Administered 2024-06-30: 6 mg via INTRAMUSCULAR

## 2024-06-30 NOTE — Progress Notes (Signed)
 Chief Complaint  Patient presents with   Insect Bite    All over bug bites after working in dog bites    HPI  Patient presents today for multiple bug bites occurring after cleaning out her dog pen.  There were a lot of insects in there.  They were spraying it to get rid of the fleas as well.  They did clear out tall grass and she may have rubbed up against that.  However she was wearing long sleeves and long pants with her pants legs fitting close against her ankles.  She was not wearing bug spray.  This occurred 3 days ago.  PMH: Smoking status noted Review of Systems  Objective: BP 128/74   Pulse (!) 110   Temp (!) 97.4 F (36.3 C)   Ht 5' 2 (1.575 m)   Wt 166 lb 3.2 oz (75.4 kg)   SpO2 92%   BMI 30.40 kg/m  Gen: NAD, alert, cooperative with exam HEENT: NCAT, EOMI, PERRL CV: RRR, good S1/S2, no murmur Resp: CTABL, no wheezes, non-labored  Ext: No edema, warm multiple erythematous papules scattered over the thighs.  There are few on the lower legs as well.  She points out 1 below the right breast.  And another on the lower abdomen.  None of these are weeping or purulent.  The erythema is minimal looking more inflammatory than infectious. Neuro: Alert and oriented, No gross deficits  Insect bite of thigh, unspecified laterality, initial encounter -     Betamethasone  Sod Phos & Acet  Follow-up as needed Butler Der, MD

## 2024-07-03 ENCOUNTER — Other Ambulatory Visit: Payer: Self-pay | Admitting: Family Medicine

## 2024-07-03 DIAGNOSIS — Z1231 Encounter for screening mammogram for malignant neoplasm of breast: Secondary | ICD-10-CM

## 2024-07-06 ENCOUNTER — Inpatient Hospital Stay: Admission: RE | Admit: 2024-07-06 | Source: Ambulatory Visit

## 2024-07-13 NOTE — Progress Notes (Signed)
 07/14/2024 Name: [name] MRN: [id number] DOB: 11/25/1951  Chief Complaint  Patient presents with   Hyperlipidemia    Yvonne [name] is a 73 y.o. year old female who presented for a telephone visit.  I connected with  Yvonne [name] on 07/14/24 by telephone and verified that I am speaking with the correct person using two identifiers. I discussed the limitations of evaluation and management by telemedicine. The patient expressed understanding and agreed to proceed.  Patient was located in her home and [name] in PCP office during this visit.   They were referred to the pharmacist by their PCP for assistance in managing hyperlipidemia.    Subjective: Patient is a new start Repatha , she had her first injection on the first of August, followed by her second injection on the 15th. She was able to afford the Repatha  through the [facility] and has no concerns with access at this time. Patient denies any side effects of the medication and reports to be tolerating it well.    Care Team: Primary Care Provider: Zollie Lowers, MD ; Next Scheduled Visit: 08/25/2024  Medication Access/Adherence  Current Pharmacy:  [facility] [phone] - [place], [place] - [address] [place] [address] Phone: [phone] Fax: [phone]   Patient reports affordability concerns with their medications: No  Patient reports access/transportation concerns to their pharmacy: No  Patient reports adherence concerns with their medications:  Yes   7/29: Reports often forgetting to take medication. Uses weekly pill box, but admits to missing multiple doses of medication weekly  Hyperlipidemia/ASCVD Risk Reduction  Current lipid lowering medications: Repatha  140 mg once every 14 days (new start: 06/26/24, 07/10/24), atorvastatin  80 mg daily Medications tried in the past: ezetimibe , Nexlizet  (change in therapy --> Repatha )  Antiplatelet regimen: Clopidogrel  + Eliquis   LDL goal of  <55 - given multiple stroke/cardiac history:  LDL of 177 mg/dL on 3/69/74, increased from 98 on 02/10/24  ASCVD History: TIA x 3 Family History: N/A Risk Factors: T2DM, HTN  Current physical activity: active, tries to get steps in daily  Current medication access support:  [employer] for cholesterol medications [employer]  Clinical ASCVD: Yes The ASCVD Risk score (Arnett DK, et al., 2019) failed to calculate for the following reasons:   Risk score cannot be calculated because patient has a medical history suggesting prior/existing ASCVD  Objective: Lab Results  Component Value Date   HGBA1C 7.7 (H) 05/25/2024   Lab Results  Component Value Date   CREATININE 0.91 05/25/2024   BUN 19 05/25/2024   NA 142 05/25/2024   K 4.6 05/25/2024   CL 103 05/25/2024   CO2 22 05/25/2024   Lab Results  Component Value Date   CHOL 267 (H) 05/25/2024   HDL 69 05/25/2024   LDLCALC 177 (H) 05/25/2024   TRIG 119 05/25/2024   CHOLHDL 3.9 05/25/2024   Medications Reviewed Today     Reviewed by Bernette Falling, [facility] (Pharmacist) on 07/14/24 at 1436  Med List Status: <None>   Medication Order Taking? Sig Documenting Provider Last Dose Status Informant  Accu-Chek Softclix Lancets lancets [id number]  Test BS up to 4 times daily Dx E11.69 Zollie Lowers, MD  Active   albuterol  (VENTOLIN  HFA) 108 (90 Base) MCG/ACT inhaler [id number]  INHALE 2 PUFFS INTO THE LUNGS EVERY 6 HOURS AS NEEDED FOR WHEEZE OR SHORTNESS OF [name] Zollie Lowers, MD  Active   alendronate  (FOSAMAX ) 70 MG tablet [id number]  Take 1 tablet (70  mg total) by mouth once a week. Take with a full glass of water  on an empty stomach. Zollie Lowers, MD  Active   ALPRAZolam  (XANAX ) 1 MG tablet [id number]  ! Each morning, one each evening, and 1/2 each afternoon Zollie Lowers, MD  Active   amLODipine  (NORVASC ) 10 MG tablet [id number]  Take 1 tablet (10 mg total) by mouth daily. Zollie Lowers, MD  Active   apixaban  (ELIQUIS ) 5  MG TABS tablet [id number]  Take 1 tablet (5 mg total) by mouth 2 (two) times daily. Zollie Lowers, MD  Active   atorvastatin  (LIPITOR ) 80 MG tablet [id number] Yes Take 1 tablet (80 mg total) by mouth daily. Zollie Lowers, MD  Active   Blood Glucose Monitoring Suppl (ACCU-CHEK GUIDE ME) w/Device KIT [id number]  Test BS up to 4 times daily Dx E11.69 Zollie Lowers, MD  Active   clopidogrel  (PLAVIX ) 75 MG tablet [id number]  Take 1 tablet (75 mg total) by mouth daily. Zollie Lowers, MD  Active   conjugated estrogens  (PREMARIN ) vaginal cream [id number]  Use 0.5 gm in vagina nightly for 1 week then 2 x weekly Signa Nest A, NP  Active   cyclobenzaprine  (FLEXERIL ) 10 MG tablet [id number]  Take 1 tablet (10 mg total) by mouth 3 (three) times daily as needed for muscle spasms. Dettinger, Fonda LABOR, MD  Active   dapagliflozin  propanediol (FARXIGA ) 10 MG TABS tablet [id number]  Take 1 tablet (10 mg total) by mouth daily. Zollie Lowers, MD  Active   diclofenac  Sodium (VOLTAREN ) 1 % GEL [id number]  Apply 2 g topically 4 (four) times daily. Dettinger, Fonda LABOR, MD  Active   Evolocumab  (REPATHA  SURECLICK) 140 MG/ML EMMANUEL [id number] Yes Inject 140 mg into the skin every 14 (fourteen) days. Zollie Lowers, MD  Active   famotidine  (PEPCID ) 20 MG tablet [id number]  Take 1 tablet (20 mg total) by mouth at bedtime. Zollie Lowers, MD  Active   fluconazole  (DIFLUCAN ) 150 MG tablet [id number]  1 po q week x 4 weeks Gladis, Mary-Margaret, FNP  Active   furosemide  (LASIX ) 20 MG tablet [id number]  Take 1 tablet (20 mg total) by mouth daily. Zollie Lowers, MD  Active   glucose blood (ACCU-CHEK GUIDE TEST) test strip [id number]  Use as instructed Zollie Lowers, MD  Active   oxybutynin  (DITROPAN -XL) 5 MG 24 hr tablet [id number]  Take 1 tablet (5 mg total) by mouth at bedtime. Zollie Lowers, MD  Active   pantoprazole  (PROTONIX ) 40 MG tablet [id number]  Take 1 tablet (40 mg total) by mouth daily. For stomach Zollie Lowers, MD  Active    pregabalin  (LYRICA ) 150 MG capsule [id number]  Take 1 capsule (150 mg total) by mouth at bedtime. Zollie Lowers, MD  Active   promethazine  (PHENERGAN ) injection 25 mg [id number]   [name], FNP  Active   tirzepatide  (MOUNJARO ) 10 MG/0.5ML Pen [id number]  Inject 10 mg into the skin once a week. Zollie Lowers, MD  Active             Assessment/Plan:   Hyperlipidemia/ASCVD Risk Reduction: Currently uncontrolled.  Reviewed long term complications of uncontrolled cholesterol Reviewed dietary recommendations including following a heart healthy diet and healthy plate method Recommend to:   Continue atorvastatin  Continue Repatha    Follow Up Plan: ~4 weeks for lipids and diabetes  Patient will be due for new lipid panel and A1c in Oct  Woodie Jock, [name] PGY1 Pharmacy Resident   Mliss Tarry Griffin, [name], BCACP, CPP Clinical Pharmacist, [facility]

## 2024-07-14 ENCOUNTER — Other Ambulatory Visit (INDEPENDENT_AMBULATORY_CARE_PROVIDER_SITE_OTHER): Admitting: Pharmacist

## 2024-07-14 DIAGNOSIS — E782 Mixed hyperlipidemia: Secondary | ICD-10-CM

## 2024-07-20 ENCOUNTER — Encounter: Payer: Self-pay | Admitting: Physician Assistant

## 2024-07-20 ENCOUNTER — Ambulatory Visit (INDEPENDENT_AMBULATORY_CARE_PROVIDER_SITE_OTHER): Admitting: Physician Assistant

## 2024-07-20 VITALS — BP 183/82

## 2024-07-20 DIAGNOSIS — S80862A Insect bite (nonvenomous), left lower leg, initial encounter: Secondary | ICD-10-CM

## 2024-07-20 DIAGNOSIS — L820 Inflamed seborrheic keratosis: Secondary | ICD-10-CM

## 2024-07-20 DIAGNOSIS — L821 Other seborrheic keratosis: Secondary | ICD-10-CM

## 2024-07-20 DIAGNOSIS — W57XXXA Bitten or stung by nonvenomous insect and other nonvenomous arthropods, initial encounter: Secondary | ICD-10-CM | POA: Diagnosis not present

## 2024-07-20 DIAGNOSIS — L720 Epidermal cyst: Secondary | ICD-10-CM | POA: Diagnosis not present

## 2024-07-20 MED ORDER — CLOBETASOL PROPIONATE 0.05 % EX CREA: 1.0000 | TOPICAL_CREAM | Freq: Two times a day (BID) | CUTANEOUS | 0 refills | Status: DC

## 2024-07-20 NOTE — Patient Instructions (Signed)
 Cryotherapy Aftercare  Wash gently with soap and water everyday.   Apply Vaseline and Band-Aid daily until healed.    Important Information  Due to recent changes in healthcare laws, you may see results of your pathology and/or laboratory studies on MyChart before the doctors have had a chance to review them. We understand that in some cases there may be results that are confusing or concerning to you. Please understand that not all results are received at the same time and often the doctors may need to interpret multiple results in order to provide you with the best plan of care or course of treatment. Therefore, we ask that you please give [place] 2 business days to thoroughly review all your results before contacting the office for clarification. Should we see a critical lab result, you will be contacted sooner.   If You Need Anything After Your Visit  If you have any questions or concerns for your doctor, please call our main line at [phone] If no one answers, please leave a voicemail as directed and we will return your call as soon as possible. Messages left after 4 pm will be answered the following business day.   You may also send [place] a message via MyChart. We typically respond to MyChart messages within 1-2 business days.  For prescription refills, please ask your pharmacy to contact our office. Our fax number is [phone].  If you have an urgent issue when the clinic is closed that cannot wait until the next business day, you can page your doctor at the number below.    Please note that while we do our best to be available for urgent issues outside of office hours, we are not available 24/7.   If you have an urgent issue and are unable to reach [place], you may choose to seek medical care at your doctor's office, retail clinic, urgent care center, or emergency room.  If you have a medical emergency, please immediately call 911 or go to the emergency department. In the event of  inclement weather, please call our main line at [phone] for an update on the status of any delays or closures.  Dermatology Medication Tips: Please keep the boxes that topical medications come in in order to help keep track of the instructions about where and how to use these. Pharmacies typically print the medication instructions only on the boxes and not directly on the medication tubes.   If your medication is too expensive, please contact our office at [phone] or send [place] a message through MyChart.   We are unable to tell what your co-pay for medications will be in advance as this is different depending on your insurance coverage. However, we may be able to find a substitute medication at lower cost or fill out paperwork to get insurance to cover a needed medication.   If a prior authorization is required to get your medication covered by your insurance company, please allow [place] 1-2 business days to complete this process.  Drug prices often vary depending on where the prescription is filled and some pharmacies may offer cheaper prices.  The website www.goodrx.com contains coupons for medications through different pharmacies. The prices here do not account for what the cost may be with help from insurance (it may be cheaper with your insurance), but the website can give you the price if you did not use any insurance.  - You can print the associated coupon and take it with your prescription to the pharmacy.  -  You may also stop by our office during regular business hours and pick up a GoodRx coupon card.  - If you need your prescription sent electronically to a different pharmacy, notify our office through [facility] or by phone at [phone]

## 2024-07-20 NOTE — Progress Notes (Signed)
   New Patient Visit   Subjective  Yvonne [name] is a 73 y.o. female NEW PATIENT who presents for the following: Spots of neck. The one her right neck is almost gone. Sometimes they blow up then go back down.   Other concerns:  -- itchy rash on left leg. Her doctor said it was chiggers.  -- itchy area on left lower back that catches on her clothes.      The following portions of the chart were reviewed this encounter and updated as appropriate: medications, allergies, medical history  Review of Systems:  No other skin or systemic complaints except as noted in HPI or Assessment and Plan.  Objective  Well appearing patient in no apparent distress; mood and affect are within normal limits.   A focused examination was performed of the following areas: neck, trunk, legs and arms.    Relevant exam findings are noted in the Assessment and Plan.  Left Lower Back Erythematous stuck-on, waxy papule or plaque  Assessment & Plan   EPIDERMAL INCLUSION CYST Exam: Subcutaneous nodule at post neck  Benign-appearing. Exam most consistent with an epidermal inclusion cyst. Discussed that a cyst is a benign growth that can grow over time and sometimes get irritated or inflamed. Recommend observation if it is not bothersome. Discussed option of surgical excision to remove it if it is growing, symptomatic, or other changes noted. Please call for new or changing lesions so they can be evaluated.  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  Benign-appearing.  Call clinic for new or changing lesions.   Destruction of lesion - Left Lower Back Complexity: simple   Destruction method: cryotherapy   Informed consent: discussed and consent obtained    Timeout:  patient name, date of birth, surgical site, and procedure verified Lesion destroyed using liquid nitrogen: Yes   Region frozen until ice ball extended beyond lesion: Yes   Outcome: patient tolerated procedure well with no complications   Post-procedure details: wound care instructions given    EPIDERMAL INCLUSION CYST   INSECT BITE OF LEFT LOWER LEG, INITIAL ENCOUNTER   SEBORRHEIC KERATOSIS    Return if symptoms worsen or fail to improve.  I, Roseline Hutchinson, CMA, am acting as scribe for Algie Cales K, PA-C .   Documentation: I have reviewed the above documentation for accuracy and completeness, and I agree with the above.  Magic Mohler K, PA-C

## 2024-07-26 ENCOUNTER — Other Ambulatory Visit: Payer: Self-pay | Admitting: Family Medicine

## 2024-07-26 DIAGNOSIS — W57XXXA Bitten or stung by nonvenomous insect and other nonvenomous arthropods, initial encounter: Secondary | ICD-10-CM | POA: Diagnosis not present

## 2024-07-26 DIAGNOSIS — K219 Gastro-esophageal reflux disease without esophagitis: Secondary | ICD-10-CM | POA: Diagnosis not present

## 2024-07-26 DIAGNOSIS — I10 Essential (primary) hypertension: Secondary | ICD-10-CM | POA: Diagnosis not present

## 2024-07-26 DIAGNOSIS — Z7902 Long term (current) use of antithrombotics/antiplatelets: Secondary | ICD-10-CM | POA: Diagnosis not present

## 2024-07-26 DIAGNOSIS — E785 Hyperlipidemia, unspecified: Secondary | ICD-10-CM | POA: Diagnosis not present

## 2024-07-26 DIAGNOSIS — Z66 Do not resuscitate: Secondary | ICD-10-CM | POA: Diagnosis not present

## 2024-07-26 DIAGNOSIS — E119 Type 2 diabetes mellitus without complications: Secondary | ICD-10-CM | POA: Diagnosis not present

## 2024-07-26 DIAGNOSIS — Z7984 Long term (current) use of oral hypoglycemic drugs: Secondary | ICD-10-CM | POA: Diagnosis not present

## 2024-07-26 DIAGNOSIS — Z79899 Other long term (current) drug therapy: Secondary | ICD-10-CM | POA: Diagnosis not present

## 2024-07-26 DIAGNOSIS — R21 Rash and other nonspecific skin eruption: Secondary | ICD-10-CM | POA: Diagnosis not present

## 2024-07-28 ENCOUNTER — Telehealth: Payer: Self-pay | Admitting: Family Medicine

## 2024-07-28 NOTE — Telephone Encounter (Signed)
 Copied from CRM [id number]. Topic: Appointments - Appointment Scheduling >> Jul 28, 2024  8:18 AM Diannia H wrote: Patient/patient representative is calling to schedule an appointment patient went to the ER on 08/30 because she she is sore and it feels like it has heat in them. Its two little bumps and when she was in to see provider last she was given a shot and was told if they don't go away to call and be seen. Could you assist? Patients callback number is [phone]. Her dermatologists also gave her some cream for them and its not working either.

## 2024-07-28 NOTE — Telephone Encounter (Signed)
 Pt came in with grandson and dod gave her advice on what to do.

## 2024-07-29 ENCOUNTER — Ambulatory Visit (INDEPENDENT_AMBULATORY_CARE_PROVIDER_SITE_OTHER): Admitting: Family Medicine

## 2024-07-29 ENCOUNTER — Encounter: Payer: Self-pay | Admitting: Family Medicine

## 2024-07-29 VITALS — BP 152/81 | HR 65 | Temp 97.8°F | Ht 62.0 in | Wt 164.0 lb

## 2024-07-29 DIAGNOSIS — R6889 Other general symptoms and signs: Secondary | ICD-10-CM | POA: Diagnosis not present

## 2024-07-29 DIAGNOSIS — R21 Rash and other nonspecific skin eruption: Secondary | ICD-10-CM

## 2024-07-29 MED ORDER — DOXYCYCLINE HYCLATE 100 MG PO TABS: 100.0000 mg | ORAL_TABLET | Freq: Two times a day (BID) | ORAL | 0 refills | Status: DC

## 2024-07-29 MED ORDER — TRIAMCINOLONE ACETONIDE 0.1 % EX CREA: 1.0000 | TOPICAL_CREAM | Freq: Two times a day (BID) | CUTANEOUS | 0 refills | Status: DC | PRN

## 2024-07-29 MED ORDER — FLUCONAZOLE 150 MG PO TABS
150.0000 mg | ORAL_TABLET | Freq: Once | ORAL | 0 refills | Status: AC
Start: 2024-07-29 — End: 2024-07-29

## 2024-07-29 NOTE — Progress Notes (Signed)
 Subjective: CC: febrile illness PCP: Zollie Lowers, MD YEP:Yvonne [name] is a 73 y.o. female presenting to clinic today for:  Discussed the use of AI scribe software for clinical note transcription with the patient, who gave verbal consent to proceed.  History of Present Illness  Patient report that she started having low-grade fevers yesterday evening to 99.5 F.  She has some mild hoarseness but no other URI symptoms including cough, congestion, rhinorrhea, sinus pressure.  Her grandchildren are sick with some type of upper respiratory infections as well.  She does note that she has had about a 4-day history of rash on her left flank.  This started as small dots but have gradually gotten much larger.  She think she was exposed to some type of contact dermatitis through plant a few weeks ago but has not been exposed anything recently and does not recall being bitten by any ticks.  Rash is quite itchy but not painful.    ROS: Per HPI  Allergies  Allergen Reactions   Penicillins Hives and Itching    Has patient had a PCN reaction causing immediate rash, facial/tongue/throat swelling, SOB or lightheadedness with hypotension: Yes Has patient had a PCN reaction causing severe rash involving mucus membranes or skin necrosis: Unk Has patient had a PCN reaction that required hospitalization: No Has patient had a PCN reaction occurring within the last 10 years: No If all of the above answers are NO, then may proceed with Cephalosporin use.  Has patient had a PCN reaction causing immediate rash, facial/tongue/throat swelling, SOB or lightheadedness with hypotension: Yes Has patient had a PCN reaction causing severe rash involving mucus membranes or skin necrosis: No Has patient had a PCN reaction that required hospitalization No Has patient had a PCN reaction occurring within the last 10 years: No If all of the above answers are NO, then may proceed with Cephalosporin use. Has patient  had a PCN reaction causing immediate rash, facial/tongue/throat swelling, SOB or lightheadedness with hypotension: Yes Has patient had a PCN reaction causing severe rash involving mucus membranes or skin necrosis: Unk Has patient had a PCN reaction that required hospitalization: No Has patient had a PCN reaction occurring within the last 10 years: No If all of the above answers are NO, then may proceed with Cephalosporin use. Has patient had a PCN reaction causing immediate rash, facial/tongue/throat swelling, SOB or lightheadedness with hypotension: Yes Has patient had a PCN reaction causing severe rash involving mucus membranes or skin necrosis: No Has patient had a PCN reaction that required hospitalization No Has patient had a PCN reaction occurring within the last 10 years: No If all of the above answers are NO, then may proceed with Cephalosporin use. Has patient had a PCN reaction causing immediate rash, facial/tongue/throat swelling, SOB or lightheadedness with hypotension: Yes Has patient had a PCN reaction causing sev... (TRUNCATED)   Metformin  And Related Diarrhea    dizziness   Gabapentin  Other (See Comments)    Causes a lot of lethargy at a higher dose Causes a lot of lethargy at a higher dose Causes a lot of lethargy at a higher dose   Lisinopril Cough    HAS A CHRONIC COUGH AND DID NOT NOTICE IMPROVEMENT OF COUGH WHEN LISINOPRIL WAS STOPPED HAS A CHRONIC COUGH AND DID NOT NOTICE IMPROVEMENT OF COUGH WHEN LISINOPRIL WAS STOPPED HAS A CHRONIC COUGH AND DID NOT NOTICE IMPROVEMENT OF COUGH WHEN LISINOPRIL WAS STOPPED   Soap Rash    DIAL soap  Past Medical History:  Diagnosis Date   (HFpEF) heart failure with preserved ejection fraction (HCC)    Anxiety    Aortic atherosclerosis (HCC)    Arthritis    left hand   Asthma    daily and prn inhalers   CAD (coronary artery disease)    Carotid artery disease (HCC)    Chronic cough    Chronic otitis media 12/2017   Full  dentures    GERD (gastroesophageal reflux disease)    History of stroke 09/2017   weakness right hand, numbness right side face   Hyperlipidemia    Hypertension    states under control with meds., has been on med. x a long time, per pt.   Non-insulin  dependent type 2 diabetes mellitus (HCC)    Orthostatic hypotension    Overactive bladder    PSVT (paroxysmal supraventricular tachycardia) (HCC)    Recurrent acute suppurative otitis media without spontaneous rupture of left tympanic membrane 02/19/2018   Stroke (HCC)    Transient cerebral ischemia     Current Outpatient Medications:    Accu-Chek Softclix Lancets lancets, Test BS up to 4 times daily Dx E11.69, Disp: 400 each, Rfl: 3   albuterol  (VENTOLIN  HFA) 108 (90 Base) MCG/ACT inhaler, INHALE 2 PUFFS INTO THE LUNGS EVERY 6 HOURS AS NEEDED FOR WHEEZE OR SHORTNESS OF BREATH, Disp: 54 each, Rfl: 0   alendronate  (FOSAMAX ) 70 MG tablet, Take 1 tablet (70 mg total) by mouth once a week. Take with a full glass of water  on an empty stomach., Disp: 12 tablet, Rfl: 3   ALPRAZolam  (XANAX ) 1 MG tablet, ! Each morning, one each evening, and 1/2 each afternoon, Disp: 75 tablet, Rfl: 5   amLODipine  (NORVASC ) 10 MG tablet, Take 1 tablet (10 mg total) by mouth daily., Disp: 90 tablet, Rfl: 0   apixaban  (ELIQUIS ) 5 MG TABS tablet, Take 1 tablet (5 mg total) by mouth 2 (two) times daily., Disp: 180 tablet, Rfl: 3   atorvastatin  (LIPITOR ) 80 MG tablet, Take 1 tablet (80 mg total) by mouth daily., Disp: 90 tablet, Rfl: 0   Blood Glucose Monitoring Suppl (ACCU-CHEK GUIDE ME) w/Device KIT, TEST BLOOD SUGAR UP TO 4 TIMES DAILY DX E11.69, Disp: 1 kit, Rfl: 0   clobetasol  cream (TEMOVATE ) 0.05 %, Apply 1 Application topically 2 (two) times daily. Apply to affected areas of insect bites twice daily until clear, Disp: 30 g, Rfl: 0   clopidogrel  (PLAVIX ) 75 MG tablet, Take 1 tablet (75 mg total) by mouth daily., Disp: 90 tablet, Rfl: 3   conjugated estrogens   (PREMARIN ) vaginal cream, Use 0.5 gm in vagina nightly for 1 week then 2 x weekly, Disp: 12 g, Rfl: 0   cyclobenzaprine  (FLEXERIL ) 10 MG tablet, Take 1 tablet (10 mg total) by mouth 3 (three) times daily as needed for muscle spasms., Disp: 30 tablet, Rfl: 0   dapagliflozin  propanediol (FARXIGA ) 10 MG TABS tablet, Take 1 tablet (10 mg total) by mouth daily., Disp: 90 tablet, Rfl: 5   diclofenac  Sodium (VOLTAREN ) 1 % GEL, Apply 2 g topically 4 (four) times daily., Disp: 350 g, Rfl: 0   Evolocumab  (REPATHA  SURECLICK) 140 MG/ML SOAJ, Inject 140 mg into the skin every 14 (fourteen) days., Disp: 2 mL, Rfl: 3   famotidine  (PEPCID ) 20 MG tablet, Take 1 tablet (20 mg total) by mouth at bedtime., Disp: 90 tablet, Rfl: 3   fluconazole  (DIFLUCAN ) 150 MG tablet, 1 po q week x 4 weeks, Disp: 4 tablet, Rfl: 0  furosemide  (LASIX ) 20 MG tablet, Take 1 tablet (20 mg total) by mouth daily., Disp: 90 tablet, Rfl: 1   glucose blood (ACCU-CHEK GUIDE TEST) test strip, Use as instructed, Disp: 400 strip, Rfl: 3   oxybutynin  (DITROPAN -XL) 5 MG 24 hr tablet, Take 1 tablet (5 mg total) by mouth at bedtime., Disp: 90 tablet, Rfl: 3   pantoprazole  (PROTONIX ) 40 MG tablet, Take 1 tablet (40 mg total) by mouth daily. For stomach, Disp: 30 tablet, Rfl: 11   pregabalin  (LYRICA ) 150 MG capsule, Take 1 capsule (150 mg total) by mouth at bedtime., Disp: 30 capsule, Rfl: 1   tirzepatide  (MOUNJARO ) 10 MG/0.5ML Pen, Inject 10 mg into the skin once a week., Disp: 6 mL, Rfl: 3  Current Facility-Administered Medications:    promethazine  (PHENERGAN ) injection 25 mg, 25 mg, Intramuscular, Q6H PRN, Gladis, Mary-Margaret, FNP, 25 mg at 12/28/22 1316 Social History   Socioeconomic History   Marital status: Married    Spouse name: Not on file   Number of children: 3   Years of education: Not on file   Highest education level: GED or equivalent  Occupational History   Occupation: retired    Comment: Producer, television/film/video and restaurants   Tobacco Use   Smoking status: Never   Smokeless tobacco: Never  Vaping Use   Vaping status: Never Used  Substance and Sexual Activity   Alcohol use: Not Currently   Drug use: No   Sexual activity: Yes    Birth control/protection: Surgical    Comment: hyst  Other Topics Concern   Not on file  Social History Narrative   Lives at home home with husband with    Son, daughter in law and 3 children.  D[place], his girlfriend and her child also moved in 10/2018      Disabled.   Social Drivers of Corporate investment banker Strain: Low Risk  (06/26/2024)   Received from [facility]   Overall Financial Resource Strain (CARDIA)    How hard is it for you to pay for the very basics like food, housing, medical care, and heating?: Not hard at all  Recent Concern: Financial Resource Strain - Medium Risk (05/23/2024)   Overall Financial Resource Strain (CARDIA)    Difficulty of Paying Living Expenses: Somewhat hard  Food Insecurity: No Food Insecurity (06/26/2024)   Received from [facility]   Hunger Vital Sign    Within the past 12 months, you worried that your food would run out before you got the money to buy more.: Never true    Within the past 12 months, the food you bought just didn't last and you didn't have money to get more.: Never true  Transportation Needs: No Transportation Needs (06/26/2024)   Received from [facility] - Transportation    Lack of Transportation (Medical): No    Lack of Transportation (Non-Medical): No  Physical Activity: Sufficiently Active (06/26/2024)   Received from [facility]   Exercise Vital Sign    On average, how many days per week do you engage in moderate to strenuous exercise (like a brisk walk)?: 7 days    On average, how many minutes do you engage in exercise at this level?: 60 min  Recent Concern: Physical Activity - Insufficiently Active (05/23/2024)   Exercise Vital Sign    Days of Exercise per Week: 4 days    Minutes of  Exercise per Session: 20 min  Stress: Stress Concern Present (06/26/2024)  Received from [facility] - Occupational Stress Questionnaire    Do you feel stress - tense, restless, nervous, or anxious, or unable to sleep at night because your mind is troubled all the time - these days?: Very much  Social Connections: Moderately Isolated (06/26/2024)   Received from [facility]   Social Connection and Isolation Panel    In a typical week, how many times do you talk on the phone with family, friends, or neighbors?: More than three times a week    How often do you get together with friends or relatives?: Once a week    How often do you attend church or religious services?: Never    Do you belong to any clubs or organizations such as church groups, unions, fraternal or athletic groups, or school groups?: No    How often do you attend meetings of the clubs or organizations you belong to?: Never    Are you married, widowed, divorced, separated, never married, or living with a partner?: Married  Intimate Partner Violence: Not At Risk (06/26/2024)   Received from [facility]   Humiliation, Afraid, Rape, and Kick questionnaire    Within the last year, have you been afraid of your partner or ex-partner?: No    Within the last year, have you been humiliated or emotionally abused in other ways by your partner or ex-partner?: No    Within the last year, have you been kicked, hit, slapped, or otherwise physically hurt by your partner or ex-partner?: No    Within the last year, have you been raped or forced to have any kind of sexual activity by your partner or ex-partner?: No   Family History  Problem Relation Age of Onset   Breast cancer Mother    Arthritis Mother    Diabetes Mother    CVA Mother    Stroke Mother    Diabetes Father    Heart disease Father        CABG.  Does not know age of onset   Asthma Father    Hepatitis C Sister    Peripheral  Artery Disease Daughter    Arthritis Brother    Cancer Brother        metastic cancer   Arthritis-Osteo Son     Objective: Office vital signs reviewed. BP (!) 152/81   Pulse 65   Temp 97.8 F (36.6 C)   Ht 5' 2 (1.575 m)   Wt 164 lb (74.4 kg)   SpO2 95%   BMI 30.00 kg/m   Physical Examination:  General: Awake, alert, well nourished, No acute distress HEENT: Normal    Neck: No masses palpated. No lymphadenopathy    Ears: Tympanic membranes intact, normal light reflex, no erythema, no bulging    Eyes: PERRLA, extraocular membranes intact, sclera white    Nose: nasal turbinates moist, clear nasal discharge    Throat: moist mucus membranes, mild oropharyngeal erythema, no tonsillar exudate.  Airway is patent Cardio: regular rate and rhythm, S1S2 heard, no murmurs appreciated Pulm: clear to auscultation bilaterally, no wheezes, rhonchi or rales; normal work of breathing on room air Skin: 2 very large, annular lesions along the left flank.  They both have central pores but no active drainage.  There is induration, warmth and erythema.  Assessment/ Plan: 73 y.o. female   Assessment & Plan   Flu-like symptoms - Plan: Veritor Flu A/B Waived, Novel Coronavirus, NAA ([facility]), Rapid Strep Screen (  [facility] ONLY)  Rash - Plan: doxycycline  (VIBRA -TABS) 100 MG tablet, triamcinolone  cream (KENALOG ) 0.1 %, fluconazole  (DIFLUCAN ) 150 MG tablet  May be viral but she is negative for flu and strep here.  COVID sent.  I am going to treat her rash with doxycycline  because it does look like she has got a soft tissue infection.  I have also given her triamcinolone  cream to apply topically as needed.  Home care instructions reviewed and reasons for reevaluation discussed.    Norene CHRISTELLA Fielding, DO [facility] [phone]

## 2024-07-30 LAB — RAPID STREP SCREEN (MED CTR MEBANE ONLY): Strep Gp A Ag, IA W/Reflex: NEGATIVE

## 2024-07-30 LAB — VERITOR FLU A/B WAIVED
Influenza A: NEGATIVE
Influenza B: NEGATIVE

## 2024-07-30 LAB — CULTURE, GROUP A STREP

## 2024-07-30 LAB — NOVEL CORONAVIRUS, NAA: SARS-CoV-2, NAA: NOT DETECTED

## 2024-07-31 ENCOUNTER — Ambulatory Visit: Payer: Self-pay | Admitting: Family Medicine

## 2024-07-31 ENCOUNTER — Other Ambulatory Visit: Payer: Self-pay | Admitting: Nurse Practitioner

## 2024-07-31 ENCOUNTER — Other Ambulatory Visit: Payer: Self-pay | Admitting: Family Medicine

## 2024-07-31 MED ORDER — MOLNUPIRAVIR EUA 200MG CAPSULE: 4.0000 | ORAL_CAPSULE | Freq: Two times a day (BID) | ORAL | 0 refills | Status: DC

## 2024-07-31 MED ORDER — NIRMATRELVIR/RITONAVIR (PAXLOVID)TABLET: 3.0000 | ORAL_TABLET | Freq: Two times a day (BID) | ORAL | 0 refills | Status: DC

## 2024-07-31 NOTE — Telephone Encounter (Signed)
 Name from pharmacy: LAGEVRIO  200 MG CAP (EUA-COMM)   Pharmacy comment: Alternative Requested:THE PRESCRIBED MEDICATION IS NOT COVERED BY INSURANCE. PLEASE CONSIDER CHANGING TO ONE OF THE SUGGESTED COVERED ALTERNATIVES.

## 2024-07-31 NOTE — Telephone Encounter (Signed)
 LAGEVRIO  200 MG CAPS capsule        Changed from: molnupiravir  EUA (LAGEVRIO ) 200 mg CAPS capsule    Pharmacy comment: Alternative Requested:THE PRESCRIBED MEDICATION IS NOT COVERED BY INSURANCE. PLEASE CONSIDER CHANGING TO ONE OF THE SUGGESTED COVERED ALTERNATIVES.

## 2024-08-02 NOTE — Progress Notes (Unsigned)
 Yvonne [name], female    DOB: 1951-03-08    MRN: [id number]   Brief patient profile:  45  yowf  never smoker says asthma as infant no memory  referred to pulmonary clinic in [place]  08/03/2024 by Yvonne [name]  for cough x 2014    Yvonne [name] eval 2017 with UACS and possible ILD  not seen since then  Yvonne [name] eval ? Year inflammation    History of Present Illness  08/03/2024  Pulmonary/ 1st office eval/ Yvonne [name] /  Office  Chief Complaint  Patient presents with   Establish Care   Cough  Dyspnea:  half a mile around [employer] stops due to sob  Cough: all day long clearing throat/ 1st thing in am clear, rest the time not productive  Sleep: bed is flat with 2 pillows and still clears her throat p hours   SABA use: no better with saba  02: none  No better on prednisone / gabapentin / tessalon  / abx     No obvious day to day or daytime pattern/variability or assoc excess/ purulent sputum or mucus plugs or hemoptysis or cp or chest tightness, subjective wheeze or overt sinus or hb symptoms.    Also denies any obvious fluctuation of symptoms with weather or environmental changes or other aggravating or alleviating factors except as outlined above   No unusual exposure hx or h/o childhood pna or knowledge of premature birth.  Current Allergies, Complete Past Medical History, Past Surgical History, Family History, and Social History were reviewed in [employer] record.  ROS  The following are not active complaints unless bolded Hoarseness, sore throat, dysphagia, dental problems, itching, sneezing,  nasal congestion or discharge of excess mucus or purulent secretions, ear ache,   fever, chills, sweats, unintended wt loss or wt gain, classically pleuritic or exertional cp,  orthopnea pnd or arm/hand swelling  or leg swelling, presyncope, palpitations, abdominal pain, anorexia, nausea, vomiting, diarrhea  or change in bowel habits or change in bladder  habits, change in stools or change in urine, dysuria, hematuria,  rash, arthralgias, visual complaints, headache, numbness, weakness or ataxia or problems with walking or coordination,  change in mood or  memory.            Outpatient Medications Prior to Visit  Medication Sig Dispense Refill   Accu-Chek Softclix Lancets lancets Test BS up to 4 times daily Dx E11.69 400 each 3   albuterol  (VENTOLIN  HFA) 108 (90 Base) MCG/ACT inhaler INHALE 2 PUFFS INTO THE LUNGS EVERY 6 HOURS AS NEEDED FOR WHEEZE OR SHORTNESS OF BREATH 54 each 0   alendronate  (FOSAMAX ) 70 MG tablet Take 1 tablet (70 mg total) by mouth once a week. Take with a full glass of water  on an empty stomach. 12 tablet 3   ALPRAZolam  (XANAX ) 1 MG tablet ! Each morning, one each evening, and 1/2 each afternoon 75 tablet 5   amLODipine  (NORVASC ) 10 MG tablet Take 1 tablet (10 mg total) by mouth daily. 90 tablet 0   apixaban  (ELIQUIS ) 5 MG TABS tablet Take 1 tablet (5 mg total) by mouth 2 (two) times daily. 180 tablet 3   atorvastatin  (LIPITOR ) 80 MG tablet Take 1 tablet (80 mg total) by mouth daily. 90 tablet 0   Blood Glucose Monitoring Suppl (ACCU-CHEK GUIDE ME) w/Device KIT TEST BLOOD SUGAR UP TO 4 TIMES DAILY DX E11.69 1 kit 0   clobetasol  cream (TEMOVATE ) 0.05 % Apply 1 Application topically 2 (two) times daily. Apply to  affected areas of insect bites twice daily until clear 30 g 0   clopidogrel  (PLAVIX ) 75 MG tablet Take 1 tablet (75 mg total) by mouth daily. 90 tablet 3   conjugated estrogens  (PREMARIN ) vaginal cream Use 0.5 gm in vagina nightly for 1 week then 2 x weekly 12 g 0   cyclobenzaprine  (FLEXERIL ) 10 MG tablet Take 1 tablet (10 mg total) by mouth 3 (three) times daily as needed for muscle spasms. 30 tablet 0   dapagliflozin  propanediol (FARXIGA ) 10 MG TABS tablet Take 1 tablet (10 mg total) by mouth daily. 90 tablet 5   diclofenac  Sodium (VOLTAREN ) 1 % GEL Apply 2 g topically 4 (four) times daily. 350 g 0   doxycycline   (VIBRA -TABS) 100 MG tablet Take 1 tablet (100 mg total) by mouth 2 (two) times daily for 10 days. 20 tablet 0   Evolocumab  (REPATHA  SURECLICK) 140 MG/ML SOAJ Inject 140 mg into the skin every 14 (fourteen) days. 2 mL 3   famotidine  (PEPCID ) 20 MG tablet Take 1 tablet (20 mg total) by mouth at bedtime. 90 tablet 3   furosemide  (LASIX ) 20 MG tablet Take 1 tablet (20 mg total) by mouth daily. 90 tablet 1   glucose blood (ACCU-CHEK GUIDE TEST) test strip Use as instructed 400 strip 3   LAGEVRIO  200 MG CAPS capsule TAKE 4 CAPSULES (800 MG TOTAL) BY MOUTH 2 (TWO) TIMES DAILY FOR 5 DAYS. CANCEL PAXLOVID  RX. 40 capsule 0   oxybutynin  (DITROPAN -XL) 5 MG 24 hr tablet Take 1 tablet (5 mg total) by mouth at bedtime. 90 tablet 3   pantoprazole  (PROTONIX ) 40 MG tablet Take 1 tablet (40 mg total) by mouth daily. For stomach 30 tablet 11   pregabalin  (LYRICA ) 150 MG capsule Take 1 capsule (150 mg total) by mouth at bedtime. 30 capsule 1   tirzepatide  (MOUNJARO ) 10 MG/0.5ML Pen Inject 10 mg into the skin once a week. 6 mL 3   triamcinolone  cream (KENALOG ) 0.1 % Apply 1 Application topically 2 (two) times daily as needed (itchy rash). 30 g 0   Facility-Administered Medications Prior to Visit  Medication Dose Route Frequency Provider Last Rate Last Admin   promethazine  (PHENERGAN ) injection 25 mg  25 mg Intramuscular Q6H PRN [name], FNP   25 mg at 12/28/22 1316    Past Medical History:  Diagnosis Date   (HFpEF) heart failure with preserved ejection fraction (HCC)    Anxiety    Aortic atherosclerosis (HCC)    Arthritis    left hand   Asthma    daily and prn inhalers   CAD (coronary artery disease)    Carotid artery disease (HCC)    Chronic cough    Chronic otitis media 12/2017   Full dentures    GERD (gastroesophageal reflux disease)    History of stroke 09/2017   weakness right hand, numbness right side face   Hyperlipidemia    Hypertension    states under control with meds., has been  on med. x a long time, per pt.   Non-insulin  dependent type 2 diabetes mellitus (HCC)    Orthostatic hypotension    Overactive bladder    PSVT (paroxysmal supraventricular tachycardia) (HCC)    Recurrent acute suppurative otitis media without spontaneous rupture of left tympanic membrane 02/19/2018   Stroke (HCC)    Transient cerebral ischemia       Objective:     BP (!) 143/79   Pulse (!) 56   Ht 5' 2 (1.575 m)  Wt 165 lb 9.6 oz (75.1 kg)   SpO2 97% Comment: ra  BMI 30.29 kg/m   SpO2: 97 % (ra) amb wf constant throat clearing / cough at end of out   Edentulous/ nl turbinaties   HEENT : Oropharynx  dear, no pnd      Nasal turbinates nl    NECK :  without  apparent JVD/ palpable Nodes/TM    LUNGS: no acc muscle use,  Nl contour chest which is clear to A and P bilaterally without cough on insp or exp maneuvers   CV:  RRR  no s3 or murmur or increase in P2, and no edema   ABD:  soft and nontender   MS:  Gait nl   ext warm without deformities Or obvious joint restrictions  calf tenderness, cyanosis or clubbing    SKIN: warm and dry without lesions    NEURO:  alert, approp, nl sensorium with  no motor or cerebellar deficits apparent.    CXR PA and Lateral:   08/03/2024 :    I personally reviewed images and impression is as follows:     Mild kyphosis / no acute findings      Assessment   Assessment & Plan Upper airway cough syndrome Onset was 2014  while on lisinopril p 1 year of rx  - no response to gabapentin  at 2400 mg or max ppi/ h2 hs  - MRI sinus 04/16/24  Inflammatory changes of the paranasal sinuses particularly affecting the left sphenoid sinus. Chronic volume loss of the left maxillary sinus - Allergy screen 08/03/2024 >  Eos 0. /  IgE     >>>> change alb to neb with bud bid / 1st gen H1 blockers per guidelines  / max gerd rx / depomedrol 120 mg IM (cautioned need to minitor glucoses carefully for next 5 days and stop fosamax  for  now   Comment: Upper airway cough syndrome (previously labeled PNDS),  is so named because it's frequently impossible to sort out how much is  CR/sinusitis with freq throat clearing (which can be related to primary GERD)   vs  causing  secondary ( extra esophageal)  GERD from wide swings in gastric pressure that occur with throat clearing, often  promoting self use of mint and menthol lozenges that reduce the lower esophageal sphincter tone and exacerbate the problem further in a cyclical fashion.   These are the same pts (now being labeled as having irritable larynx syndrome by some cough centers) who not infrequently have a history of having failed to tolerate ace inhibitors(as happened here) ,  dry powder inhalers or biphosphonates (fosamax ) or report having atypical/extraesophageal reflux symptoms(LPR)  that don't respond to standard doses of PPI  and are easily confused as having aecopd or asthma flares by even experienced allergists/ pulmonologists (myself included).    DOE (dyspnea on exertion) PFT's  05/25/16  FEV1 1.64 (74 % ) ratio 0.88  p 23 % improvement from saba p 0 prior to study with DLCO  14.40 (66%)   and FV curve nl  and ERV 11% at wt 189   08/03/2024   Walked on RA  x  3  lap(s) =  approx 450  ft  @ mod fast pace, stopped due to end of study s sob  with lowest 02 sats 96% improved to 97% by end   - DOE labs pending 08/03/2024   >>>   trial of bud alb neb bid 08/03/2024    This is  the equivalent of air supra in a neb which is less likely to trigger her cough   Discussed in detail all the  indications, usual  risks and alternatives  relative to the benefits with patient who agrees to proceed with Rx as outlined.         Each maintenance medication was reviewed in detail including emphasizing most importantly the difference between maintenance and prns and under what circumstances the prns are to be triggered using an action plan format where appropriate.  Total time for H and P,  chart review, counseling, reviewing neb device(s) and generating customized AVS unique to this office visit / same day charting = 60 min new pt eval  for multiple  refractory respiratory  symptoms of uncertain etiology          AVS  Patient Instructions  Stop fosamax , stop ventolin    Start albubuterol 2.5 mg 1st thing in am with 0.25 of budesonide  and again around 12 hours  per nebulizer whether you think you need them or not x 4 week trial   Depomedrol 120 mg IM    Pantoprazole  (protonix ) 40 mg   Take  30-60 min before first meal of the day and Pepcid  (famotidine )  20 mg an hour before bedtime until return to office - this is the best way to tell whether stomach acid is contributing to your problem.    GERD (REFLUX)  is an extremely common cause of respiratory symptoms just like yours , many times with no obvious heartburn at all.    It can be treated with medication, but also with lifestyle changes including elevation of the head of your bed (ideally with 6 -8inch blocks under the headboard of your bed),  Smoking cessation, avoidance of late meals, excessive alcohol, and avoid fatty foods, chocolate, peppermint, colas, red wine, and acidic juices such as orange juice.  NO MINT OR MENTHOL PRODUCTS SO NO COUGH DROPS (Luden's pectin ok)  USE SUGARLESS CANDY INSTEAD (Jolley ranchers or Stover's or Life Savers) or even ice chips will also do - the key is to swallow to prevent all throat clearing. NO OIL BASED VITAMINS - use powdered substitutes.  Avoid fish oil when coughing.    Please remember to go to the lab department   for your tests - we will call you with the results when they are available.      Please remember to go to the  x-ray department  @  [facility] for your tests - we will call you with the results when they are available     Please schedule a follow up office visit in 4 weeks, sooner if needed    For drainage / throat tickle try take CHLORPHENIRAMINE  4 mg   -  may cause drowsiness so start with just a dose or two an hour before bedtime and see how you tolerate it before trying in daytime.      Ozell America, MD 08/03/2024

## 2024-08-03 ENCOUNTER — Ambulatory Visit (HOSPITAL_COMMUNITY)
Admission: RE | Admit: 2024-08-03 | Discharge: 2024-08-03 | Disposition: A | Discharge status: Home / Self Care | Source: Ambulatory Visit | Attending: Internal Medicine | Admitting: Internal Medicine

## 2024-08-03 ENCOUNTER — Encounter: Payer: Self-pay | Admitting: Internal Medicine

## 2024-08-03 ENCOUNTER — Ambulatory Visit: Admitting: Internal Medicine

## 2024-08-03 ENCOUNTER — Ambulatory Visit (HOSPITAL_COMMUNITY)
Admission: RE | Admit: 2024-08-03 | Discharge: 2024-08-03 | Disposition: A | Discharge status: Home / Self Care | Source: Ambulatory Visit | Attending: Family Medicine | Admitting: Family Medicine

## 2024-08-03 VITALS — BP 143/79 | HR 56 | Ht 62.0 in | Wt 165.6 lb

## 2024-08-03 DIAGNOSIS — R0609 Other forms of dyspnea: Secondary | ICD-10-CM | POA: Insufficient documentation

## 2024-08-03 DIAGNOSIS — Z1231 Encounter for screening mammogram for malignant neoplasm of breast: Secondary | ICD-10-CM | POA: Diagnosis not present

## 2024-08-03 DIAGNOSIS — R058 Other specified cough: Secondary | ICD-10-CM | POA: Diagnosis not present

## 2024-08-03 DIAGNOSIS — I700 Atherosclerosis of aorta: Secondary | ICD-10-CM | POA: Diagnosis not present

## 2024-08-03 MED ORDER — METHYLPREDNISOLONE ACETATE 80 MG/ML IJ SUSP
120.0000 mg | Freq: Once | INTRAMUSCULAR | Status: AC
  Administered 2024-08-03: 120 mg via INTRAMUSCULAR

## 2024-08-03 MED ORDER — BUDESONIDE 0.25 MG/2ML IN SUSP: 0.2500 mg | Freq: Two times a day (BID) | RESPIRATORY_TRACT | 12 refills | Status: AC

## 2024-08-03 MED ORDER — ALBUTEROL SULFATE (2.5 MG/3ML) 0.083% IN NEBU: 2.5000 mg | INHALATION_SOLUTION | Freq: Two times a day (BID) | RESPIRATORY_TRACT | 12 refills | Status: AC

## 2024-08-03 NOTE — Patient Instructions (Addendum)
 Stop fosamax , stop ventolin    Start albubuterol 2.5 mg 1st thing in am with 0.25 of budesonide  and again around 12 hours  per nebulizer whether you think you need them or not x 4 week trial   Depomedrol 120 mg IM    Pantoprazole  (protonix ) 40 mg   Take  30-60 min before first meal of the day and Pepcid  (famotidine )  20 mg an hour before bedtime until return to office - this is the best way to tell whether stomach acid is contributing to your problem.    GERD (REFLUX)  is an extremely common cause of respiratory symptoms just like yours , many times with no obvious heartburn at all.    It can be treated with medication, but also with lifestyle changes including elevation of the head of your bed (ideally with 6 -8inch blocks under the headboard of your bed),  Smoking cessation, avoidance of late meals, excessive alcohol, and avoid fatty foods, chocolate, peppermint, colas, red wine, and acidic juices such as orange juice.  NO MINT OR MENTHOL PRODUCTS SO NO COUGH DROPS (Luden's pectin ok)  USE SUGARLESS CANDY INSTEAD (Jolley ranchers or Stover's or Life Savers) or even ice chips will also do - the key is to swallow to prevent all throat clearing. NO OIL BASED VITAMINS - use powdered substitutes.  Avoid fish oil when coughing.    Please remember to go to the lab department   for your tests - we will call you with the results when they are available.      Please remember to go to the  x-ray department  @  [facility] for your tests - we will call you with the results when they are available     Please schedule a follow up office visit in 4 weeks, sooner if needed    For drainage / throat tickle try take CHLORPHENIRAMINE  4 mg   - may cause drowsiness so start with just a dose or two an hour before bedtime and see how you tolerate it before trying in daytime.

## 2024-08-03 NOTE — Assessment & Plan Note (Addendum)
 PFT's  05/25/16  FEV1 1.64 (74 % ) ratio 0.88  p 23 % improvement from saba p 0 prior to study with DLCO  14.40 (66%)   and FV curve nl  and ERV 11% at wt 189   08/03/2024   Walked on RA  x  3  lap(s) =  approx 450  ft  @ mod fast pace, stopped due to end of study s sob  with lowest 02 sats 96% improved to 97% by end   - DOE labs pending 08/03/2024   >>>   trial of bud alb neb bid 08/03/2024    This is the equivalent of air supra in a neb which is less likely to trigger her cough   Discussed in detail all the  indications, usual  risks and alternatives  relative to the benefits with patient who agrees to proceed with Rx as outlined.         Each maintenance medication was reviewed in detail including emphasizing most importantly the difference between maintenance and prns and under what circumstances the prns are to be triggered using an action plan format where appropriate.  Total time for H and P, chart review, counseling, reviewing neb device(s) and generating customized AVS unique to this office visit / same day charting = 60 min new pt eval  for multiple  refractory respiratory  symptoms of uncertain etiology

## 2024-08-03 NOTE — Assessment & Plan Note (Addendum)
 Onset was 2014  while on lisinopril p 1 year of rx  - no response to gabapentin  at 2400 mg or max ppi/ h2 hs  - MRI sinus 04/16/24  Inflammatory changes of the paranasal sinuses particularly affecting the left sphenoid sinus. Chronic volume loss of the left maxillary sinus - Allergy screen 08/03/2024 >  Eos 0. /  IgE     >>>> change alb to neb with bud bid / 1st gen H1 blockers per guidelines  / max gerd rx / depomedrol 120 mg IM (cautioned need to minitor glucoses carefully for next 5 days and stop fosamax  for now   Comment: Upper airway cough syndrome (previously labeled PNDS),  is so named because it's frequently impossible to sort out how much is  CR/sinusitis with freq throat clearing (which can be related to primary GERD)   vs  causing  secondary ( extra esophageal)  GERD from wide swings in gastric pressure that occur with throat clearing, often  promoting self use of mint and menthol lozenges that reduce the lower esophageal sphincter tone and exacerbate the problem further in a cyclical fashion.   These are the same pts (now being labeled as having irritable larynx syndrome by some cough centers) who not infrequently have a history of having failed to tolerate ace inhibitors(as happened here) ,  dry powder inhalers or biphosphonates (fosamax ) or report having atypical/extraesophageal reflux symptoms(LPR)  that don't respond to standard doses of PPI  and are easily confused as having aecopd or asthma flares by even experienced allergists/ pulmonologists (myself included).

## 2024-08-06 ENCOUNTER — Ambulatory Visit: Payer: Self-pay | Admitting: Internal Medicine

## 2024-08-06 LAB — CBC WITH DIFFERENTIAL/PLATELET
Basophils Absolute: 0 x10E3/uL (ref 0.0–0.2)
Basos: 1 %
EOS (ABSOLUTE): 0.1 x10E3/uL (ref 0.0–0.4)
Eos: 1 %
Hematocrit: 41 % (ref 34.0–46.6)
Hemoglobin: 13.5 g/dL (ref 11.1–15.9)
Immature Grans (Abs): 0 x10E3/uL (ref 0.0–0.1)
Immature Granulocytes: 0 %
Lymphocytes Absolute: 2.4 x10E3/uL (ref 0.7–3.1)
Lymphs: 44 %
MCH: 29.2 pg (ref 26.6–33.0)
MCHC: 32.9 g/dL (ref 31.5–35.7)
MCV: 89 fL (ref 79–97)
Monocytes Absolute: 0.4 x10E3/uL (ref 0.1–0.9)
Monocytes: 6 %
Neutrophils Absolute: 2.6 x10E3/uL (ref 1.4–7.0)
Neutrophils: 48 %
Platelets: 260 x10E3/uL (ref 150–450)
RBC: 4.62 x10E6/uL (ref 3.77–5.28)
RDW: 13.1 % (ref 11.7–15.4)
WBC: 5.5 x10E3/uL (ref 3.4–10.8)

## 2024-08-06 LAB — SEDIMENTATION RATE: Sed Rate: 12 mm/h (ref 0–40)

## 2024-08-06 LAB — BASIC METABOLIC PANEL WITH GFR
BUN/Creatinine Ratio: 18 (ref 12–28)
BUN: 17 mg/dL (ref 8–27)
CO2: 22 mmol/L (ref 20–29)
Calcium: 9.6 mg/dL (ref 8.7–10.3)
Chloride: 104 mmol/L (ref 96–106)
Creatinine, Ser: 0.93 mg/dL (ref 0.57–1.00)
Glucose: 131 mg/dL — ABNORMAL HIGH (ref 70–99)
Potassium: 4.2 mmol/L (ref 3.5–5.2)
Sodium: 140 mmol/L (ref 134–144)
eGFR: 64 mL/min/1.73 (ref >59)

## 2024-08-06 LAB — TSH: TSH: 1.93 u[IU]/mL (ref 0.450–4.500)

## 2024-08-06 LAB — IGE: IgE (Immunoglobulin E), Serum: 32 [IU]/mL (ref 6–495)

## 2024-08-06 LAB — BRAIN NATRIURETIC PEPTIDE: BNP: 99.7 pg/mL (ref 0.0–100.0)

## 2024-08-07 ENCOUNTER — Ambulatory Visit: Payer: Self-pay | Admitting: Internal Medicine

## 2024-08-07 NOTE — Progress Notes (Signed)
 ATC x1. lmtcb

## 2024-08-07 NOTE — Telephone Encounter (Signed)
 FYI Only or Action Required?: FYI only for provider.  Patient is followed in Pulmonology for chronic cough, last seen on 08/03/2024 by Darlean Ozell NOVAK, MD.  Called Nurse Triage reporting Lab results.  Triage Disposition: Information or Advice Only Call  Patient/caregiver understands and will follow disposition?: Yes Copied from CRM #[id number]. Topic: Clinical - Lab/Test Results >> Aug 07, 2024 10:06 AM Yvonne [name] wrote: Reason for CRM: pt calling for lab results Reason for Disposition  [1] Other NON-URGENT information for PCP AND [2] does not require PCP response  Answer Assessment - Initial Assessment Questions 1. REASON FOR CALL or QUESTION: What is your reason for calling today? or How can I best     Patient called to get her lab results-MD note read to patient. No questions. Verbalized understanding.  2. CALLER: Document the source of call. (e.g., laboratory staff, caregiver or patient).     patient  Protocols used: PCP Call - No Triage-A-AH

## 2024-08-10 ENCOUNTER — Telehealth: Payer: Self-pay

## 2024-08-10 NOTE — Telephone Encounter (Signed)
 Copied from CRM [id number]. Topic: Clinical - Lab/Test Results >> Aug 10, 2024 12:39 PM Russell PARAS wrote: Reason for CRM:   Pt returning call from clinic regarding her chest x-ray results. Reviewed chart and provided results note from provider. No further questions, pt understands recommendations  nfn

## 2024-08-10 NOTE — Progress Notes (Signed)
 Atx x1 lmtcb - sending letter

## 2024-08-16 ENCOUNTER — Other Ambulatory Visit: Payer: Self-pay | Admitting: Family Medicine

## 2024-08-16 DIAGNOSIS — E782 Mixed hyperlipidemia: Secondary | ICD-10-CM

## 2024-08-16 DIAGNOSIS — G459 Transient cerebral ischemic attack, unspecified: Secondary | ICD-10-CM

## 2024-08-17 ENCOUNTER — Ambulatory Visit: Payer: Self-pay

## 2024-08-17 NOTE — Telephone Encounter (Signed)
 Appt made.

## 2024-08-17 NOTE — Telephone Encounter (Signed)
 FYI Only or Action Required?: FYI only for provider.  Patient was last seen in primary care on 07/29/2024 by Jolinda Norene HERO, DO.  Called Nurse Triage reporting Otalgia.  Symptoms began 2 days ago.  Interventions attempted: Nothing.  Symptoms are: unchanged.  Triage Disposition: See Physician Within 24 Hours  Patient/caregiver understands and will follow disposition?: Yes     Copied from CRM #[id number]. Topic: Clinical - Red Word Triage >> Aug 17, 2024  2:22 PM Lauren C wrote: Red Word that prompted transfer to Nurse Triage: Ear infection symptoms, ear pain for 2 days.      Reason for Disposition  Earache  (Exceptions: Brief ear pain of lasting less than 60 minutes, or earache occurring during air travel.)  Answer Assessment - Initial Assessment Questions 1. LOCATION: Which ear is involved?     Left ear  2. ONSET: When did the ear pain start?      2 days ago  3. SEVERITY: How bad is the pain?  (Scale 1-10; mild, moderate or severe)     6/10 4. URI SYMPTOMS: Do you have a runny nose or cough?     No 5. FEVER: Do you have a fever? If Yes, ask: What is your temperature, how was it measured, and when did it start?     No 6. CAUSE: Have you been swimming recently?, How often do you use Q-TIPS?, Have you had any recent air travel or scuba diving?     Unsure  7. OTHER SYMPTOMS: Do you have any other symptoms? (e.g., decreased hearing, dizziness, headache, stiff neck, vomiting)     No  Protocols used: Earache-A-AH

## 2024-08-18 ENCOUNTER — Other Ambulatory Visit: Payer: Self-pay | Admitting: Nurse Practitioner

## 2024-08-18 ENCOUNTER — Encounter: Payer: Self-pay | Admitting: Nurse Practitioner

## 2024-08-18 ENCOUNTER — Other Ambulatory Visit

## 2024-08-18 ENCOUNTER — Ambulatory Visit (INDEPENDENT_AMBULATORY_CARE_PROVIDER_SITE_OTHER): Admitting: Nurse Practitioner

## 2024-08-18 VITALS — BP 142/81 | HR 82 | Temp 97.3°F | Ht 62.0 in | Wt 161.4 lb

## 2024-08-18 DIAGNOSIS — H6022 Malignant otitis externa, left ear: Secondary | ICD-10-CM

## 2024-08-18 MED ORDER — OFLOXACIN 0.3 % OT SOLN: 5.0000 [drp] | Freq: Every day | OTIC | 0 refills | Status: DC

## 2024-08-18 MED ORDER — CIPROFLOXACIN-DEXAMETHASONE 0.3-0.1 % OT SUSP: 4.0000 [drp] | Freq: Two times a day (BID) | OTIC | 0 refills | Status: DC

## 2024-08-18 NOTE — Progress Notes (Signed)
 Subjective:  Patient ID: Yvonne [name], female    DOB: Jan 13, 1951, 73 y.o.   MRN: [id number]  Patient Care Team: Zollie Lowers, MD as PCP - General (Family Medicine) Skeet Juliene SAUNDERS, DO as Consulting Physician (Neurology) Frances Ozell RAMAN, [name] as Social Worker (Licensed Clinical Social Worker) Billee, Mliss BIRCH, [facility] (Pharmacist) Santo Stanly LABOR, MD as Consulting Physician (Cardiology) Ladora Ross Lacy Phebe, MD as Referring Physician (Optometry) Darlean Ozell NOVAK, MD as Consulting Physician (Pulmonary Disease)   Chief Complaint:  Ear Pain (Left ear pain)   HPI: Yvonne [name] is a 73 y.o. female presenting on 08/18/2024 for Ear Pain (Left ear pain)   Discussed the use of AI scribe software for clinical note transcription with the patient, who gave verbal consent to proceed.  History of Present Illness Yvonne [name] is a 73 year old female who presents with left earache for two days.  She initially experienced a sore throat, which progressed to an earache on the left side. The earache has persisted for two days and is described as an aggravating ache with a pain level of 4 out of 10. The patient reports that the pain was worse yesterday and that she feels better today. No fever or loss of hearing is associated with the earache.  She has a chronic cough for which she has been prescribed various treatments by multiple doctors in the past. The duration of the cough is unspecified, but it is a long-standing issue.      Relevant past medical, surgical, family, and social history reviewed and updated as indicated.  Allergies and medications reviewed and updated. Data reviewed: Chart in Epic.   Past Medical History:  Diagnosis Date   (HFpEF) heart failure with preserved ejection fraction (HCC)    Anxiety    Aortic atherosclerosis    Arthritis    left hand   Asthma    daily and prn inhalers   CAD (coronary artery disease)    Carotid artery disease    Chronic cough    Chronic  otitis media 12/2017   Full dentures    GERD (gastroesophageal reflux disease)    History of stroke 09/2017   weakness right hand, numbness right side face   Hyperlipidemia    Hypertension    states under control with meds., has been on med. x a long time, per pt.   Non-insulin  dependent type 2 diabetes mellitus (HCC)    Orthostatic hypotension    Overactive bladder    PSVT (paroxysmal supraventricular tachycardia)    Recurrent acute suppurative otitis media without spontaneous rupture of left tympanic membrane 02/19/2018   Stroke (HCC)    Transient cerebral ischemia     Past Surgical History:  Procedure Laterality Date   ABDOMINAL HYSTERECTOMY     complete   CATARACT EXTRACTION W/ INTRAOCULAR LENS IMPLANT Right    CHOLECYSTECTOMY     COLONOSCOPY N/A 08/17/2015   Procedure: COLONOSCOPY;  Surgeon: Claudis RAYMOND Rivet, MD;  [facility];  Service: Endoscopy;  Laterality: N/A;  200 - moved to 7:30 - Ann notified pt   GAS INSERTION Right    x 2 - eye   IR ANGIO INTRA EXTRACRAN SEL INTERNAL CAROTID BILAT MOD SED  10/22/2023   IR ANGIO VERTEBRAL SEL VERTEBRAL UNI L MOD SED  10/22/2023   LACRIMAL DUCT EXPLORATION Bilateral    removal of tear ducts   MYRINGOTOMY WITH TUBE PLACEMENT Bilateral 01/28/2018   Procedure: BILATERAL MYRINGOTOMY WITH TUBE  PLACEMENT;  Surgeon: Karis Clunes, MD;  Location: [facility];  Service: ENT;  Laterality: Bilateral;   VITRECTOMY Left 09/14/2015    Social History   Socioeconomic History   Marital status: Married    Spouse name: Not on file   Number of children: 3   Years of education: Not on file   Highest education level: GED or equivalent  Occupational History   Occupation: retired    Comment: Producer, television/film/video and restaurants  Tobacco Use   Smoking status: Never   Smokeless tobacco: Never  Vaping Use   Vaping status: Never Used  Substance and Sexual Activity   Alcohol use: Not Currently   Drug use: No   Sexual activity:  Yes    Birth control/protection: Surgical    Comment: hyst  Other Topics Concern   Not on file  Social History Narrative   Lives at home home with husband with    Son, daughter in law and 3 children.  Darden, his girlfriend and her child also moved in 10/2018      Disabled.   Social Drivers of Corporate investment banker Strain: Low Risk  (06/26/2024)   Received [facility]   Overall Financial Resource Strain (CARDIA)    How hard is it for you to pay for the very basics like food, housing, medical care, and heating?: Not hard at all  Recent Concern: Financial Resource Strain - Medium Risk (05/23/2024)   Overall Financial Resource Strain (CARDIA)    Difficulty of Paying Living Expenses: Somewhat hard  Food Insecurity: No Food Insecurity (06/26/2024)   Received from [facility]   Hunger Vital Sign    Within the past 12 months, you worried that your food would run out before you got the money to buy more.: Never true    Within the past 12 months, the food you bought just didn't last and you didn't have money to get more.: Never true  Transportation Needs: No Transportation Needs (06/26/2024)   Received from [facility] - Transportation    Lack of Transportation (Medical): No    Lack of Transportation (Non-Medical): No  Physical Activity: Sufficiently Active (06/26/2024)   Received from [facility]   Exercise Vital Sign    On average, how many days per week do you engage in moderate to strenuous exercise (like a brisk walk)?: 7 days    On average, how many minutes do you engage in exercise at this level?: 60 min  Recent Concern: Physical Activity - Insufficiently Active (05/23/2024)   Exercise Vital Sign    Days of Exercise per Week: 4 days    Minutes of Exercise per Session: 20 min  Stress: Stress Concern Present (06/26/2024)   Received from [facility] - Occupational Stress Questionnaire    Do you feel stress  - tense, restless, nervous, or anxious, or unable to sleep at night because your mind is troubled all the time - these days?: Very much  Social Connections: Moderately Isolated (06/26/2024)   Received from [facility]   Social Connection and Isolation Panel    In a typical week, how many times do you talk on the phone with family, friends, or neighbors?: More than three times a week    How often do you get together with friends or relatives?: Once a week    How often do you attend church or religious services?: Never  Do you belong to any clubs or organizations such as church groups, unions, fraternal or athletic groups, or school groups?: No    How often do you attend meetings of the clubs or organizations you belong to?: Never    Are you married, widowed, divorced, separated, never married, or living with a partner?: Married  Intimate Partner Violence: Not At Risk (06/26/2024)   Received from [facility]   Humiliation, Afraid, Rape, and Kick questionnaire    Within the last year, have you been afraid of your partner or ex-partner?: No    Within the last year, have you been humiliated or emotionally abused in other ways by your partner or ex-partner?: No    Within the last year, have you been kicked, hit, slapped, or otherwise physically hurt by your partner or ex-partner?: No    Within the last year, have you been raped or forced to have any kind of sexual activity by your partner or ex-partner?: No    Outpatient Encounter Medications as of 08/18/2024  Medication Sig   Accu-Chek Softclix Lancets lancets Test BS up to 4 times daily Dx E11.69   albuterol  (PROVENTIL ) (2.5 MG/3ML) 0.083% nebulizer solution Take 3 mLs (2.5 mg total) by nebulization 2 (two) times daily.   ALPRAZolam  (XANAX ) 1 MG tablet ! Each morning, one each evening, and 1/2 each afternoon   amLODipine  (NORVASC ) 10 MG tablet Take 1 tablet (10 mg total) by mouth daily.   apixaban  (ELIQUIS ) 5 MG TABS tablet Take 1 tablet (5  mg total) by mouth 2 (two) times daily.   atorvastatin  (LIPITOR ) 80 MG tablet Take 1 tablet (80 mg total) by mouth daily.   Blood Glucose Monitoring Suppl (ACCU-CHEK GUIDE ME) w/Device KIT TEST BLOOD SUGAR UP TO 4 TIMES DAILY DX E11.69   budesonide  (PULMICORT ) 0.25 MG/2ML nebulizer solution Take 2 mLs (0.25 mg total) by nebulization 2 (two) times daily.   clobetasol  cream (TEMOVATE ) 0.05 % Apply 1 Application topically 2 (two) times daily. Apply to affected areas of insect bites twice daily until clear   clopidogrel  (PLAVIX ) 75 MG tablet Take 1 tablet (75 mg total) by mouth daily.   conjugated estrogens  (PREMARIN ) vaginal cream Use 0.5 gm in vagina nightly for 1 week then 2 x weekly   cyclobenzaprine  (FLEXERIL ) 10 MG tablet Take 1 tablet (10 mg total) by mouth 3 (three) times daily as needed for muscle spasms.   dapagliflozin  propanediol (FARXIGA ) 10 MG TABS tablet Take 1 tablet (10 mg total) by mouth daily.   diclofenac  Sodium (VOLTAREN ) 1 % GEL Apply 2 g topically 4 (four) times daily.   Evolocumab  (REPATHA  SURECLICK) 140 MG/ML SOAJ INJECT 140 MG INTO THE SKIN EVERY 14 (FOURTEEN) DAYS.   famotidine  (PEPCID ) 20 MG tablet Take 1 tablet (20 mg total) by mouth at bedtime.   furosemide  (LASIX ) 20 MG tablet Take 1 tablet (20 mg total) by mouth daily.   glucose blood (ACCU-CHEK GUIDE TEST) test strip Use as instructed   LAGEVRIO  200 MG CAPS capsule TAKE 4 CAPSULES (800 MG TOTAL) BY MOUTH 2 (TWO) TIMES DAILY FOR 5 DAYS. CANCEL PAXLOVID  RX.   oxybutynin  (DITROPAN -XL) 5 MG 24 hr tablet Take 1 tablet (5 mg total) by mouth at bedtime.   pantoprazole  (PROTONIX ) 40 MG tablet Take 1 tablet (40 mg total) by mouth daily. For stomach   pregabalin  (LYRICA ) 150 MG capsule Take 1 capsule (150 mg total) by mouth at bedtime.   tirzepatide  (MOUNJARO ) 10 MG/0.5ML Pen Inject 10 mg into  the skin once a week.   triamcinolone  cream (KENALOG ) 0.1 % Apply 1 Application topically 2 (two) times daily as needed (itchy rash).    ciprofloxacin -dexamethasone  (CIPRODEX ) OTIC suspension Place 4 drops into the left ear 2 (two) times daily for 10 days.   Facility-Administered Encounter Medications as of 08/18/2024  Medication   promethazine  (PHENERGAN ) injection 25 mg    Allergies  Allergen Reactions   Penicillins Hives and Itching    Has patient had a PCN reaction causing immediate rash, facial/tongue/throat swelling, SOB or lightheadedness with hypotension: Yes Has patient had a PCN reaction causing severe rash involving mucus membranes or skin necrosis: Unk Has patient had a PCN reaction that required hospitalization: No Has patient had a PCN reaction occurring within the last 10 years: No If all of the above answers are NO, then may proceed with Cephalosporin use.  Has patient had a PCN reaction causing immediate rash, facial/tongue/throat swelling, SOB or lightheadedness with hypotension: Yes Has patient had a PCN reaction causing severe rash involving mucus membranes or skin necrosis: No Has patient had a PCN reaction that required hospitalization No Has patient had a PCN reaction occurring within the last 10 years: No If all of the above answers are NO, then may proceed with Cephalosporin use. Has patient had a PCN reaction causing immediate rash, facial/tongue/throat swelling, SOB or lightheadedness with hypotension: Yes Has patient had a PCN reaction causing severe rash involving mucus membranes or skin necrosis: Unk Has patient had a PCN reaction that required hospitalization: No Has patient had a PCN reaction occurring within the last 10 years: No If all of the above answers are NO, then may proceed with Cephalosporin use. Has patient had a PCN reaction causing immediate rash, facial/tongue/throat swelling, SOB or lightheadedness with hypotension: Yes Has patient had a PCN reaction causing severe rash involving mucus membranes or skin necrosis: No Has patient had a PCN reaction that required  hospitalization No Has patient had a PCN reaction occurring within the last 10 years: No If all of the above answers are NO, then may proceed with Cephalosporin use. Has patient had a PCN reaction causing immediate rash, facial/tongue/throat swelling, SOB or lightheadedness with hypotension: Yes Has patient had a PCN reaction causing sev... (TRUNCATED)   Metformin  And Related Diarrhea    dizziness   Gabapentin  Other (See Comments)    Causes a lot of lethargy at a higher dose Causes a lot of lethargy at a higher dose Causes a lot of lethargy at a higher dose   Lisinopril Cough    HAS A CHRONIC COUGH AND DID NOT NOTICE IMPROVEMENT OF COUGH WHEN LISINOPRIL WAS STOPPED HAS A CHRONIC COUGH AND DID NOT NOTICE IMPROVEMENT OF COUGH WHEN LISINOPRIL WAS STOPPED HAS A CHRONIC COUGH AND DID NOT NOTICE IMPROVEMENT OF COUGH WHEN LISINOPRIL WAS STOPPED   Soap Rash    DIAL soap    Pertinent ROS per HPI, otherwise unremarkable      Objective:  BP (!) 142/81   Pulse 82   Temp (!) 97.3 F (36.3 C) (Temporal)   Ht 5' 2 (1.575 m)   Wt 161 lb 6.4 oz (73.2 kg)   SpO2 95%   BMI 29.52 kg/m    Wt Readings from Last 3 Encounters:  08/18/24 161 lb 6.4 oz (73.2 kg)  08/03/24 165 lb 9.6 oz (75.1 kg)  07/29/24 164 lb (74.4 kg)    Physical Exam Vitals and nursing note reviewed.  Constitutional:      General: She  is not in acute distress. HENT:     Right Ear: Tympanic membrane, ear canal and external ear normal. There is no impacted cerumen.     Left Ear: Swelling and tenderness present.     Nose: Nose normal.     Mouth/Throat:     Mouth: Mucous membranes are moist.  Eyes:     General:        Left eye: No discharge.     Extraocular Movements: Extraocular movements intact.     Conjunctiva/sclera: Conjunctivae normal.     Pupils: Pupils are equal, round, and reactive to light.  Cardiovascular:     Heart sounds: Normal heart sounds.  Pulmonary:     Effort: Pulmonary effort is normal.      Breath sounds: Normal breath sounds.  Musculoskeletal:        General: Normal range of motion.     Right lower leg: No edema.     Left lower leg: No edema.  Skin:    General: Skin is warm and dry.     Findings: No rash.  Neurological:     Mental Status: She is alert and oriented to person, place, and time.  Psychiatric:        Mood and Affect: Mood normal.        Thought Content: Thought content normal.        Judgment: Judgment normal.    Physical Exam HEENT: Left otitis externa     Results for orders placed or performed in visit on 08/03/24  Basic metabolic panel with GFR   Collection Time: 08/03/24 10:18 AM  Result Value Ref Range   Glucose 131 (H) 70 - 99 mg/dL   BUN 17 8 - 27 mg/dL   Creatinine, Ser 9.06 0.57 - 1.00 mg/dL   eGFR 64 >40 fO/fpw/8.26   BUN/Creatinine Ratio 18 12 - 28   Sodium 140 134 - 144 mmol/L   Potassium 4.2 3.5 - 5.2 mmol/L   Chloride 104 96 - 106 mmol/L   CO2 22 20 - 29 mmol/L   Calcium  9.6 8.7 - 10.3 mg/dL  Brain natriuretic peptide   Collection Time: 08/03/24 10:18 AM  Result Value Ref Range   BNP 99.7 0.0 - 100.0 pg/mL  CBC with Differential/Platelet   Collection Time: 08/03/24 10:18 AM  Result Value Ref Range   WBC 5.5 3.4 - 10.8 x10E3/uL   RBC 4.62 3.77 - 5.28 x10E6/uL   Hemoglobin 13.5 11.1 - 15.9 g/dL   Hematocrit 58.9 65.9 - 46.6 %   MCV 89 79 - 97 fL   MCH 29.2 26.6 - 33.0 pg   MCHC 32.9 31.5 - 35.7 g/dL   RDW 86.8 88.2 - 84.5 %   Platelets 260 150 - 450 x10E3/uL   Neutrophils 48 Not Estab. %   Lymphs 44 Not Estab. %   Monocytes 6 Not Estab. %   Eos 1 Not Estab. %   Basos 1 Not Estab. %   Neutrophils Absolute 2.6 1.4 - 7.0 x10E3/uL   Lymphocytes Absolute 2.4 0.7 - 3.1 x10E3/uL   Monocytes Absolute 0.4 0.1 - 0.9 x10E3/uL   EOS (ABSOLUTE) 0.1 0.0 - 0.4 x10E3/uL   Basophils Absolute 0.0 0.0 - 0.2 x10E3/uL   Immature Granulocytes 0 Not Estab. %   Immature Grans (Abs) 0.0 0.0 - 0.1 x10E3/uL  Sedimentation rate   Collection  Time: 08/03/24 10:18 AM  Result Value Ref Range   Sed Rate 12 0 - 40 mm/hr  IgE   Collection  Time: 08/03/24 10:18 AM  Result Value Ref Range   IgE (Immunoglobulin E), Serum 32 6 - 495 IU/mL  TSH   Collection Time: 08/03/24 10:18 AM  Result Value Ref Range   TSH 1.930 0.450 - 4.500 uIU/mL       Pertinent labs & imaging results that were available during my care of the patient were reviewed by me and considered in my medical decision making.  Assessment & Plan:  Yvonne [name] was seen today for ear pain.  Diagnoses and all orders for this visit:  Acute malignant otitis externa of left ear -     ciprofloxacin -dexamethasone  (CIPRODEX ) OTIC suspension; Place 4 drops into the left ear 2 (two) times daily for 10 days.     Assessment and Plan Assessment & Plan Left otitis externa Left otitis externa diagnosed based on clinical presentation. - Prescribed Ciprodex  ear drops, 2 drops in left ear for 10 days. - Instructed to pick up medication from [facility] in [place] and start today.      Continue all other maintenance medications.  Follow up plan: Return if symptoms worsen or fail to improve.   Continue healthy lifestyle choices, including diet (rich in fruits, vegetables, and lean proteins, and low in salt and simple carbohydrates) and exercise (at least 30 minutes of moderate physical activity daily).  Educational handout given for  Otitis Externa  Otitis externa is an infection of the outer ear canal. The outer ear canal is the area between the outside of the ear and the eardrum. Otitis externa is sometimes called swimmer's ear. What are the causes? Common causes of this condition include: Swimming in dirty water . Moisture in the ear. An injury to the inside of the ear. An object stuck in the ear. A cut or scrape on the outside of the ear or in the ear canal. What increases the risk? You are more likely to get this condition if you go swimming often. What are the signs or  symptoms? Itching in the ear. This is often the first symptom. Swelling of the ear. Redness in the ear. Ear pain. The pain may get worse when you pull on your ear. Pus coming from the ear. How is this treated? This condition may be treated with: Antibiotic ear drops. These are often given for 10-14 days. Medicines to reduce itching and swelling. Follow these instructions at home: If you were prescribed antibiotic ear drops, use them as told by your doctor. Do not stop using them even if you start to feel better. Take over-the-counter and prescription medicines only as told by your doctor. Avoid getting water  in your ears as told by your doctor. You may be told to avoid swimming or water  sports for a few days. Keep all follow-up visits. How is this prevented? Keep your ears dry. Use the corner of a towel to dry your ears after you swim or bathe. Try not to scratch or put things in your ear. Doing these things makes it easier for germs to grow in your ear. Avoid swimming in lakes, dirty water , or swimming pools that may not have the right amount of a chemical called chlorine. Contact a doctor if: You have a fever. Your ear is still red, swollen, or painful after 3 days. You still have pus coming from your ear after 3 days. Your redness, swelling, or pain gets worse. You have a very bad headache. Get help right away if: You have redness, swelling, and pain or tenderness behind your ear. Summary Otitis  externa is an infection of the outer ear canal. Symptoms include pain, redness, and swelling of the ear. If you were prescribed antibiotic ear drops, use them as told by your doctor. Do not stop using them even if you start to feel better. Try not to scratch or put things in your ear. This information is not intended to replace advice given to you by your health care provider. Make sure you discuss any questions you have with your health care provider. Document Revised: 01/25/2021 Document  Reviewed: 01/25/2021 Elsevier Patient Education  2024 [employer] Inc.    The above assessment and management plan was discussed with the patient. The patient verbalized understanding of and has agreed to the management plan. Patient is aware to call the clinic if they develop any new symptoms or if symptoms persist or worsen. Patient is aware when to return to the clinic for a follow-up visit. Patient educated on when it is appropriate to go to the emergency department.  Favian Kittleson [place] Thompson, DNP [facility] [address] [place], [place] [address] [phone]

## 2024-08-18 NOTE — Telephone Encounter (Signed)
  neomycin-polymyxin-hydrocortisone (CORTISPORIN) 3.5-10000-1 OTIC suspension        Changed from: ciprofloxacin -dexamethasone  (CIPRODEX ) OTIC suspension    Pharmacy comment: Alternative Requested:THE PRESCRIBED MEDICATION IS NOT COVERED BY INSURANCE. PLEASE CONSIDER CHANGING TO ONE OF THE SUGGESTED COVERED ALTERNATIVES.   All Pharmacy Suggested Alternatives:  neomycin-polymyxin-hydrocortisone (CORTISPORIN) 3.5-10000-1 OTIC suspension ofloxacin  (FLOXIN ) 0.3 % OTIC solution

## 2024-08-25 ENCOUNTER — Ambulatory Visit: Payer: Self-pay | Admitting: Family Medicine

## 2024-08-25 ENCOUNTER — Encounter: Payer: Self-pay | Admitting: Family Medicine

## 2024-08-25 ENCOUNTER — Ambulatory Visit: Admitting: Family Medicine

## 2024-08-25 VITALS — BP 118/73 | HR 72 | Temp 98.0°F | Ht 62.0 in | Wt 158.0 lb

## 2024-08-25 DIAGNOSIS — N1831 Chronic kidney disease, stage 3a: Secondary | ICD-10-CM | POA: Diagnosis not present

## 2024-08-25 DIAGNOSIS — E1122 Type 2 diabetes mellitus with diabetic chronic kidney disease: Secondary | ICD-10-CM

## 2024-08-25 DIAGNOSIS — Z23 Encounter for immunization: Secondary | ICD-10-CM | POA: Diagnosis not present

## 2024-08-25 DIAGNOSIS — E11649 Type 2 diabetes mellitus with hypoglycemia without coma: Secondary | ICD-10-CM

## 2024-08-25 DIAGNOSIS — Z7985 Long-term (current) use of injectable non-insulin antidiabetic drugs: Secondary | ICD-10-CM | POA: Diagnosis not present

## 2024-08-25 LAB — LIPID PANEL

## 2024-08-25 LAB — BAYER DCA HB A1C WAIVED: HB A1C (BAYER DCA - WAIVED): 7.3 % — ABNORMAL HIGH (ref 4.8–5.6)

## 2024-08-25 MED ORDER — NEXLIZET 180-10 MG PO TABS: 1.0000 | ORAL_TABLET | Freq: Every day | ORAL | 3 refills | Status: DC

## 2024-08-25 NOTE — Progress Notes (Signed)
 Subjective:  Patient ID: Yvonne [name], female    DOB: 04-22-1951  Age: 73 y.o. MRN: [id number]  CC: Medical Management of Chronic Issues   HPI  Discussed the use of AI scribe software for clinical note transcription with the patient, who gave verbal consent to proceed.  History of Present Illness Yvonne [name] is a 73 year old female with hyperlipidemia and diabetes who presents with a reaction to Repatha  and concerns about blood sugar levels.  She develops red bumps every time she takes Repatha , which resolve about a week after the medication is out of her system. The bumps appear the morning after taking the injection.  She is currently taking atorvastatin . She recalls previously being on Nexlizet , which was stopped in July. She is not on any COVID medication.  She monitors her blood sugar levels, which range from 69 to 125. She experiences symptoms of hypoglycemia, such as cold sweats, when her blood sugar drops to 69. This has occurred twice in the last couple of weeks. She uses injectable medication for diabetes management.  Her weight has decreased from 165 pounds three months ago to 158 pounds currently. She attributes this to dietary changes, specifically reducing junk food intake. No fever or other symptoms during a recent COVID exposure.          08/25/2024   11:23 AM 06/30/2024   10:18 AM 06/09/2024   10:59 AM  Depression screen PHQ 2/9  Decreased Interest 2 2 2   Down, Depressed, Hopeless 3 3 3   PHQ - 2 Score 5 5 5   Altered sleeping 3 3 2   Tired, decreased energy 2 1 1   Change in appetite 3 3 3   Feeling bad or failure about yourself  3 3 3   Trouble concentrating 1 1 1   Moving slowly or fidgety/restless 2 2 2   Suicidal thoughts 0 0 0  PHQ-9 Score 19 18 17   Difficult doing work/chores Somewhat difficult Somewhat difficult Somewhat difficult    History Earlene has a past medical history of (HFpEF) heart failure with preserved ejection fraction (HCC), Anxiety, Aortic  atherosclerosis, Arthritis, Asthma, CAD (coronary artery disease), Carotid artery disease, Chronic cough, Chronic otitis media (12/2017), Full dentures, GERD (gastroesophageal reflux disease), History of stroke (09/2017), Hyperlipidemia, Hypertension, Non-insulin  dependent type 2 diabetes mellitus (HCC), Orthostatic hypotension, Overactive bladder, PSVT (paroxysmal supraventricular tachycardia), Recurrent acute suppurative otitis media without spontaneous rupture of left tympanic membrane (02/19/2018), Stroke (HCC), and Transient cerebral ischemia.   She has a past surgical history that includes Cholecystectomy; Colonoscopy (N/A, 08/17/2015); Abdominal hysterectomy; Lacrimal duct exploration (Bilateral); Cataract extraction w/ intraocular lens implant (Right); Vitrectomy (Left, 09/14/2015); Gas insertion (Right); Myringotomy with tube placement (Bilateral, 01/28/2018); IR ANGIO INTRA EXTRACRAN SEL INTERNAL CAROTID BILAT MOD SED (10/22/2023); and IR ANGIO VERTEBRAL SEL VERTEBRAL UNI L MOD SED (10/22/2023).   Her family history includes Arthritis in her brother and mother; Arthritis-Osteo in her son; Asthma in her father; Breast cancer in her mother; CVA in her mother; Cancer in her brother; Diabetes in her father and mother; Heart disease in her father; Hepatitis C in her sister; Peripheral Artery Disease in her daughter; Stroke in her mother.She reports that she has never smoked. She has never used smokeless tobacco. She reports that she does not currently use alcohol. She reports that she does not use drugs.    ROS Review of Systems  Constitutional: Negative.   HENT:  Negative for congestion.   Eyes:  Negative for visual disturbance.  Respiratory:  Negative for  shortness of breath.   Cardiovascular:  Negative for chest pain.  Gastrointestinal:  Negative for abdominal pain, constipation, diarrhea, nausea and vomiting.  Genitourinary:  Negative for difficulty urinating.  Musculoskeletal:  Negative for  arthralgias and myalgias.  Neurological:  Negative for headaches.  Psychiatric/Behavioral:  Negative for sleep disturbance.     Objective:  BP 118/73   Pulse 72   Temp 98 F (36.7 C)   Ht 5' 2 (1.575 m)   Wt 158 lb (71.7 kg)   SpO2 100%   BMI 28.90 kg/m   BP Readings from Last 3 Encounters:  08/25/24 118/73  08/18/24 (!) 142/81  08/03/24 (!) 143/79    Wt Readings from Last 3 Encounters:  08/25/24 158 lb (71.7 kg)  08/18/24 161 lb 6.4 oz (73.2 kg)  08/03/24 165 lb 9.6 oz (75.1 kg)     Physical Exam Physical Exam VITALS: BP- 118/73 MEASUREMENTS: Height- 5'2, Weight- 158. GENERAL: Alert, cooperative, well developed, no acute distress HEENT: Normocephalic, normal oropharynx, moist mucous membranes CHEST: Clear to auscultation bilaterally, No wheezes, rhonchi, or crackles CARDIOVASCULAR: Normal heart rate and rhythm, S1 and S2 normal without murmurs ABDOMEN: Soft, non-tender, non-distended, without organomegaly, Normal bowel sounds EXTREMITIES: No cyanosis or edema NEUROLOGICAL: Cranial nerves grossly intact, Moves all extremities without gross motor or sensory deficit   Assessment & Plan:  Encounter for immunization -     Flu vaccine HIGH DOSE PF(Fluzone Trivalent)  Type 2 diabetes mellitus with stage 3a chronic kidney disease, without long-term current use of insulin  (HCC) -     Lipid panel -     CBC with Differential/Platelet -     CMP14+EGFR -     Bayer DCA Hb A1c Waived  Other orders -     Nexlizet ; Take 1 tablet by mouth daily. Take 180 mg by mouth daily.  Dispense: 90 tablet; Refill: 3    Assessment and Plan Assessment & Plan Type 2 diabetes mellitus with hypoglycemia and elevated A1c   She experiences hypoglycemia episodes twice in the last couple of weeks, with blood sugar readings from 69 to 125 mg/dL and an elevated J8r of 7.3%. Hypoglycemia is associated with cold sweats and occurs postprandially. Continue Mounjaro  at 10 mg. Advise keeping glucose  tablets handy for hypoglycemia management. Educate on dietary modifications focusing on complex carbohydrates to stabilize blood sugar levels. Monitor blood sugar regularly and report if hypoglycemia becomes more frequent.  Chronic kidney disease due to type 2 diabetes mellitus   Chronic kidney disease is secondary to type 2 diabetes mellitus.  Hyperlipidemia with allergic reaction to Repatha  (evolocumab )   Hyperlipidemia was previously managed with Repatha , which caused allergic reactions manifesting as red bumps. Discontinue Repatha . Resume Nexlizet  for cholesterol management. Continue atorvastatin  as previously prescribed.  Obesity   She has lost 7 pounds over three months due to dietary changes. Current weight is 158 pounds, with a target of 140 pounds or lower for optimal health. Encourage continued weight loss through dietary modifications, focusing on reducing junk food intake and increasing complex carbohydrates. Monitor weight and provide support for further weight loss.       Follow-up: Return in about 3 months (around 11/24/2024).  Butler Der, M.D.

## 2024-08-26 LAB — CBC WITH DIFFERENTIAL/PLATELET
Basophils Absolute: 0 x10E3/uL (ref 0.0–0.2)
Basos: 1 %
EOS (ABSOLUTE): 0.1 x10E3/uL (ref 0.0–0.4)
Eos: 1 %
Hematocrit: 42.7 % (ref 34.0–46.6)
Hemoglobin: 14.3 g/dL (ref 11.1–15.9)
Immature Grans (Abs): 0 x10E3/uL (ref 0.0–0.1)
Immature Granulocytes: 0 %
Lymphocytes Absolute: 2.9 x10E3/uL (ref 0.7–3.1)
Lymphs: 40 %
MCH: 29.7 pg (ref 26.6–33.0)
MCHC: 33.5 g/dL (ref 31.5–35.7)
MCV: 89 fL (ref 79–97)
Monocytes Absolute: 0.4 x10E3/uL (ref 0.1–0.9)
Monocytes: 5 %
Neutrophils Absolute: 3.8 x10E3/uL (ref 1.4–7.0)
Neutrophils: 53 %
Platelets: 241 x10E3/uL (ref 150–450)
RBC: 4.81 x10E6/uL (ref 3.77–5.28)
RDW: 12.8 % (ref 11.7–15.4)
WBC: 7.2 x10E3/uL (ref 3.4–10.8)

## 2024-08-26 LAB — CMP14+EGFR
ALT: 9 IU/L (ref 0–32)
AST: 16 IU/L (ref 0–40)
Albumin: 4.2 g/dL (ref 3.8–4.8)
Alkaline Phosphatase: 89 IU/L (ref 49–135)
BUN/Creatinine Ratio: 21 (ref 12–28)
BUN: 25 mg/dL (ref 8–27)
Bilirubin Total: 0.7 mg/dL (ref 0.0–1.2)
CO2: 22 mmol/L (ref 20–29)
Calcium: 9.7 mg/dL (ref 8.7–10.3)
Chloride: 103 mmol/L (ref 96–106)
Creatinine, Ser: 1.17 mg/dL — AB (ref 0.57–1.00)
Globulin, Total: 2.1 g/dL (ref 1.5–4.5)
Glucose: 96 mg/dL (ref 70–99)
Potassium: 4.4 mmol/L (ref 3.5–5.2)
Sodium: 140 mmol/L (ref 134–144)
Total Protein: 6.3 g/dL (ref 6.0–8.5)
eGFR: 49 mL/min/1.73 — AB (ref >59)

## 2024-08-26 LAB — LIPID PANEL
Cholesterol, Total: 136 mg/dL (ref 100–199)
HDL: 70 mg/dL (ref >39)
LDL CALC COMMENT:: 1.9 ratio (ref 0.0–4.4)
LDL Chol Calc (NIH): 54 mg/dL (ref 0–99)
Triglycerides: 52 mg/dL (ref 0–149)
VLDL Cholesterol Cal: 12 mg/dL (ref 5–40)

## 2024-08-28 ENCOUNTER — Other Ambulatory Visit: Payer: Self-pay | Admitting: Family Medicine

## 2024-08-28 DIAGNOSIS — Z79899 Other long term (current) drug therapy: Secondary | ICD-10-CM

## 2024-08-30 NOTE — Progress Notes (Signed)
Hello Yvonne [name],  Your lab result is normal and/or stable.Some minor variations that are not significant are commonly marked abnormal, but do not represent any medical problem for you.  Best regards, Kaci Dillie, M.D.

## 2024-09-01 ENCOUNTER — Telehealth: Payer: Self-pay | Admitting: Pharmacist

## 2024-09-01 ENCOUNTER — Other Ambulatory Visit

## 2024-09-01 DIAGNOSIS — G720 Drug-induced myopathy: Secondary | ICD-10-CM

## 2024-09-01 DIAGNOSIS — E782 Mixed hyperlipidemia: Secondary | ICD-10-CM

## 2024-09-01 DIAGNOSIS — E785 Hyperlipidemia, unspecified: Secondary | ICD-10-CM

## 2024-09-01 NOTE — Telephone Encounter (Signed)
 Please send me re-enrollment for [employer] PAP Farxiga  Thank you!

## 2024-09-01 NOTE — Progress Notes (Signed)
 09/01/2024 Name: Yvonne [name] MRN: [id number] DOB: 1951-01-15  Chief Complaint  Patient presents with   Hyperlipidemia    Yvonne [name] is a 73 y.o. year old female who presented for a clinic visit.   They were referred to the pharmacist by their PCP for assistance in managing hyperlipidemia/cardiovascular risk reduction and medication access.    Subjective:  Patient started Repatha  in August and has since then reported rash associated with this medication.  She has transitioned back to Nexlizet  and atorvastatin  with no issues  Care Team: Primary Care Provider: Zollie Lowers, MD   Medication Access/Adherence  Current Pharmacy:  [facility] [phone] - [place], [place] - [address] [place] [address] Phone: [phone] Fax: [phone]   Patient reports affordability concerns with their medications: Yes  Patient reports access/transportation concerns to their pharmacy: No  Patient reports adherence concerns with their medications:  yes-7/29: Reports often forgetting to take medication. Uses weekly pill box, but admits to missing multiple doses of medication weekly    Hyperlipidemia/ASCVD Risk Reduction  Current lipid lowering medications: atorvastatin  80 mg daily, Nexlizet  (restart due to Repatha  rash) Medications tried in the past: ezetimibe , Repatha  140 mg once every 14 days (RASH)  Antiplatelet regimen: Clopidogrel  + Eliquis    LDL goal of <55 - given multiple stroke/cardiac history:  LDL of 177 mg/dL on 3/69/74, increased from 98 on 02/10/24   ASCVD History: TIA x 3 Family History: N/A Risk Factors: T2DM, HTN   Current physical activity: active, tries to get steps in daily   Current medication access support:  [employer] for cholesterol medications [employer]  Clinical ASCVD: Yes The ASCVD Risk score (Arnett DK, et al., 2019) failed to calculate for the following reasons:   Risk score cannot be calculated  because patient has a medical history suggesting prior/existing ASCVD  Objective:  Lab Results  Component Value Date   HGBA1C 7.3 (H) 08/25/2024    Lab Results  Component Value Date   CREATININE 1.17 (H) 08/25/2024   BUN 25 08/25/2024   NA 140 08/25/2024   K 4.4 08/25/2024   CL 103 08/25/2024   CO2 22 08/25/2024    Lab Results  Component Value Date   CHOL 136 08/25/2024   HDL 70 08/25/2024   LDLCALC 54 08/25/2024   TRIG 52 08/25/2024   CHOLHDL 1.9 08/25/2024    Medications Reviewed Today     Reviewed by Billee Mliss BIRCH, [facility] (Pharmacist) on 09/07/24 at 0620  Med List Status: <None>   Medication Order Taking? Sig Documenting Provider Last Dose Status Informant  Accu-Chek Softclix Lancets lancets [id number]  Test BS up to 4 times daily Dx E11.69 Zollie Lowers, MD  Active   albuterol  (PROVENTIL ) (2.5 MG/3ML) 0.083% nebulizer solution [id number]  Take 3 mLs (2.5 mg total) by nebulization 2 (two) times daily. Darlean Ozell NOVAK, MD  Active   ALPRAZolam  (XANAX ) 1 MG tablet [id number]  ! Each morning, one each evening, and 1/2 each afternoon Zollie Lowers, MD  Active   amLODipine  (NORVASC ) 10 MG tablet [id number]  Take 1 tablet (10 mg total) by mouth daily. Zollie Lowers, MD  Active   apixaban  (ELIQUIS ) 5 MG TABS tablet [id number]  Take 1 tablet (5 mg total) by mouth 2 (two) times daily. Zollie Lowers, MD  Active   atorvastatin  (LIPITOR ) 80 MG tablet [id number]  Take 1 tablet (80 mg total) by mouth daily. Zollie Lowers, MD  Active   Bempedoic Acid-Ezetimibe  (NEXLIZET ) 180-10 MG  TABS [id number]  Take 1 tablet by mouth daily. Take 180 mg by mouth daily. Zollie Lowers, MD  Active   Blood Glucose Monitoring Suppl (ACCU-CHEK GUIDE ME) w/Device PRESSLEY [id number]  TEST BLOOD SUGAR UP TO 4 TIMES DAILY DX E11.69 Zollie Lowers, MD  Active   budesonide  (PULMICORT ) 0.25 MG/2ML nebulizer solution [id number]  Take 2 mLs (0.25 mg total) by nebulization 2 (two) times daily. Darlean Ozell NOVAK, MD  Active    clobetasol  cream (TEMOVATE ) 0.05 % [id number]  Apply 1 Application topically 2 (two) times daily. Apply to affected areas of insect bites twice daily until clear Sandridge, Brenda K, PA-C  Active   clopidogrel  (PLAVIX ) 75 MG tablet [id number]  Take 1 tablet (75 mg total) by mouth daily. Zollie Lowers, MD  Active   conjugated estrogens  (PREMARIN ) vaginal cream [id number]  Use 0.5 gm in vagina nightly for 1 week then 2 x weekly Signa Delon LABOR, NP  Active   cyclobenzaprine  (FLEXERIL ) 10 MG tablet [id number]  Take 1 tablet (10 mg total) by mouth 3 (three) times daily as needed for muscle spasms. Dettinger, Fonda LABOR, MD  Active   dapagliflozin  propanediol (FARXIGA ) 10 MG TABS tablet [id number]  Take 1 tablet (10 mg total) by mouth daily. Zollie Lowers, MD  Active   diclofenac  Sodium (VOLTAREN ) 1 % GEL [id number]  Apply 2 g topically 4 (four) times daily. Dettinger, Fonda LABOR, MD  Active   famotidine  (PEPCID ) 20 MG tablet [id number]  Take 1 tablet (20 mg total) by mouth at bedtime. Zollie Lowers, MD  Active   furosemide  (LASIX ) 20 MG tablet [id number]  Take 1 tablet (20 mg total) by mouth daily. Zollie Lowers, MD  Active   glucose blood (ACCU-CHEK GUIDE TEST) test strip [id number]  Use as instructed Zollie Lowers, MD  Active   ofloxacin  (FLOXIN ) 0.3 % OTIC solution [id number]  Place 5 drops into the left ear daily. [place] Hummer, Nena, NP  Active   oxybutynin  (DITROPAN -XL) 5 MG 24 hr tablet [id number]  Take 1 tablet (5 mg total) by mouth at bedtime. Zollie Lowers, MD  Active   pantoprazole  (PROTONIX ) 40 MG tablet [id number]  Take 1 tablet (40 mg total) by mouth daily. For stomach Zollie Lowers, MD  Active   pregabalin  (LYRICA ) 150 MG capsule [id number]  TAKE 1 CAPSULE BY MOUTH AT BEDTIME. Zollie Lowers, MD  Active   promethazine  (PHENERGAN ) injection 25 mg [id number]   [name], FNP  Active   tirzepatide  (MOUNJARO ) 10 MG/0.5ML Pen [id number]  Inject 10 mg into the skin once a week. Zollie Lowers, MD  Active   triamcinolone  cream (KENALOG ) 0.1 % [id number]  Apply 1 Application topically 2 (two) times daily as needed (itchy rash). Jolinda Potter M, DO  Active              Assessment/Plan:    Hyperlipidemia/ASCVD Risk Reduction: Currently CONTROLLED--LDL 54 on 07/2024 Reviewed long term complications of uncontrolled cholesterol Reviewed dietary recommendations including following a heart healthy diet and healthy plate method Recommend to:   Continue atorvastatin  Continue Nexlizet  Mail [employer] grant to patient   Mliss Tarry Griffin, [name], BCACP, CPP Clinical Pharmacist, [facility]

## 2024-09-07 ENCOUNTER — Ambulatory Visit: Admitting: Internal Medicine

## 2024-09-07 ENCOUNTER — Ambulatory Visit: Payer: Self-pay | Admitting: Internal Medicine

## 2024-09-07 NOTE — Telephone Encounter (Signed)
 This RN made one courtesy call to patient. No answer, LVM requesting call back to clinic. Routing to clinic for follow-up.

## 2024-09-07 NOTE — Telephone Encounter (Signed)
 FYI Only or Action Required?: Action required by provider: request for documentation or forms.  Patient is followed in Pulmonology for asthma, last seen on 08/03/2024 by Darlean Ozell NOVAK, MD.  Patient reports Nebulizer machine has stopped working. Patient states she is needing a new prescription to get a new machine. Patient is asking for a follow up call from office.   Triage Disposition: Call PCP Now  Patient/caregiver understands and will follow disposition?: Yes  Reason for Disposition  [1] Caller requests to speak ONLY to PCP AND [2] URGENT question  Answer Assessment - Initial Assessment Questions 1. REASON FOR CALL or QUESTION: What is your reason for calling today? or How can I best     Patient calling to report that her nebulizer machine has stopped working. Patient states she has had this machine for over 10 years. Machine stopped working yesterday. Patient states she is needing a new prescription to get a new nebulizer machine.  2. CALLER: Document the source of call. (e.g., laboratory staff, caregiver or patient).     Patient.  Protocols used: PCP Call - No Triage-A-AH

## 2024-09-07 NOTE — Telephone Encounter (Signed)
 Copied from CRM [id number]. Topic: General - Other >> Sep 07, 2024  8:58 AM Essie A wrote: Reason for CRM: Patient would like to see if Dr. Darlean would prescribe a new breathing machine for her because the one she has stopped working.  Please return her call at [phone].

## 2024-09-09 NOTE — Telephone Encounter (Signed)
 This has already been sent to Dr Darlean - in a phone note from RN.

## 2024-09-10 ENCOUNTER — Other Ambulatory Visit: Payer: Self-pay

## 2024-09-10 ENCOUNTER — Telehealth: Payer: Self-pay

## 2024-09-10 DIAGNOSIS — R0609 Other forms of dyspnea: Secondary | ICD-10-CM

## 2024-09-10 DIAGNOSIS — R058 Other specified cough: Secondary | ICD-10-CM

## 2024-09-10 NOTE — Telephone Encounter (Signed)
 Called to inform pt that when orders are placed they go to a team that handles orders specially so the reason I say 1 week is to get everyone enough time to get it handled

## 2024-09-10 NOTE — Telephone Encounter (Signed)
 Please call and advise patient   Copied from CRM #[id number]. Topic: Clinical - Order For Equipment >> Sep 10, 2024  2:03 PM Nathanel DEL wrote: Reason for CRM: pt would like to know where the order for her nebulizer was sent, and why it will take a week or so?  Pt states [employer] [facility], and she can get it same day.  Pt has been w/out her nebulizer machine and usually does treatment 2 X /day.  Pt would like a call back please.

## 2024-09-15 ENCOUNTER — Telehealth: Payer: Self-pay | Admitting: Pharmacist

## 2024-09-15 NOTE — Telephone Encounter (Signed)
 Nexlizet  PO approved [employer] grant [name] submitted all information for [employer] grant on provider portal Mailed to patient

## 2024-09-16 NOTE — Telephone Encounter (Signed)
 Re-enrollment faxed to [employer]

## 2024-09-16 NOTE — Telephone Encounter (Signed)
 Copied from CRM #[id number]. Topic: Clinical - Order For Equipment >> Sep 08, 2024  3:23 PM Yvonne [name] wrote: Reason for CRM: Patient is calling again regarding [name] new nebulizer.  Please let her know the status of it by calling her at [phone].  Thanks.  See phone note

## 2024-09-18 ENCOUNTER — Encounter: Payer: Self-pay | Admitting: *Deleted

## 2024-10-03 ENCOUNTER — Other Ambulatory Visit: Payer: Self-pay

## 2024-10-03 ENCOUNTER — Encounter (HOSPITAL_COMMUNITY): Payer: Self-pay

## 2024-10-03 ENCOUNTER — Emergency Department (HOSPITAL_COMMUNITY)
Admission: EM | Admit: 2024-10-03 | Discharge: 2024-10-03 | Disposition: A | Discharge status: Home / Self Care | Source: Ambulatory Visit

## 2024-10-03 ENCOUNTER — Emergency Department (HOSPITAL_COMMUNITY)

## 2024-10-03 DIAGNOSIS — Z043 Encounter for examination and observation following other accident: Secondary | ICD-10-CM | POA: Diagnosis not present

## 2024-10-03 DIAGNOSIS — W19XXXA Unspecified fall, initial encounter: Secondary | ICD-10-CM

## 2024-10-03 DIAGNOSIS — S80211A Abrasion, right knee, initial encounter: Secondary | ICD-10-CM | POA: Diagnosis not present

## 2024-10-03 DIAGNOSIS — I2510 Atherosclerotic heart disease of native coronary artery without angina pectoris: Secondary | ICD-10-CM | POA: Insufficient documentation

## 2024-10-03 DIAGNOSIS — E119 Type 2 diabetes mellitus without complications: Secondary | ICD-10-CM | POA: Insufficient documentation

## 2024-10-03 DIAGNOSIS — I5089 Other heart failure: Secondary | ICD-10-CM | POA: Diagnosis not present

## 2024-10-03 DIAGNOSIS — W109XXA Fall (on) (from) unspecified stairs and steps, initial encounter: Secondary | ICD-10-CM | POA: Diagnosis not present

## 2024-10-03 DIAGNOSIS — S50811A Abrasion of right forearm, initial encounter: Secondary | ICD-10-CM | POA: Diagnosis not present

## 2024-10-03 DIAGNOSIS — Z7901 Long term (current) use of anticoagulants: Secondary | ICD-10-CM | POA: Diagnosis not present

## 2024-10-03 DIAGNOSIS — S59911A Unspecified injury of right forearm, initial encounter: Secondary | ICD-10-CM | POA: Diagnosis present

## 2024-10-03 MED ORDER — ACETAMINOPHEN 500 MG PO TABS
1000.0000 mg | ORAL_TABLET | Freq: Once | ORAL | Status: DC
Start: 2024-10-03 — End: 2024-10-03
  Filled 2024-10-03: qty 2

## 2024-10-03 NOTE — Discharge Instructions (Signed)
 Evaluation for your fall was overall reassuring.  Recommend that you ice your elbow and your knee 3-4 times a day for 20 minutes each time for the next 4 days.  You can also take Tylenol  as needed for pain.  Please follow-up with your PCP.  If your symptoms worsen or change anyway, if you develop a headache visual disturbance or numbness or weakness in your extremities or any other concerning symptom please return to the ED for further evaluation.

## 2024-10-03 NOTE — ED Triage Notes (Signed)
 Pt c/o Fall, states that she simply missed steps and fell down on her arm. Denies LOC, denies hitting her head, denies N/V/D, headache. A&Ox4

## 2024-10-03 NOTE — ED Provider Notes (Signed)
 [place] EMERGENCY DEPARTMENT AT [facility] Provider Note   CSN: [id number] Arrival date & time: 10/03/24  0825     Patient presents with: Fall  HPI Yvonne [name] is a 73 y.o. female with TIA on Eliquis , CAD, type 2 diabetes, HFpEF presenting for fall.  Occurred earlier today.  She was walking up steps when she tripped on the second step and fell forward landing on her right elbow and right knee.  She denies head injury or loss of consciousness.  Not reporting right elbow pain and an abrasion to her right forearm as well as right knee pain primarily in the anterior portion of the knee.  Able to ambulate.  Denies any loss of sensation or range of motion.    Fall       Prior to Admission medications   Medication Sig Start Date End Date Taking? 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 08/03/24   Darlean Ozell NOVAK, MD  ALPRAZolam  (XANAX ) 1 MG tablet ! Each morning, one each evening, and 1/2 each afternoon 04/16/24   Zollie Lowers, MD  amLODipine  (NORVASC ) 10 MG tablet Take 1 tablet (10 mg total) by mouth daily. 04/16/24   Zollie Lowers, MD  apixaban  (ELIQUIS ) 5 MG TABS tablet Take 1 tablet (5 mg total) by mouth 2 (two) times daily. 11/28/23   Zollie Lowers, MD  atorvastatin  (LIPITOR ) 80 MG tablet Take 1 tablet (80 mg total) by mouth daily. 04/16/24   Zollie Lowers, MD  Bempedoic Acid-Ezetimibe  (NEXLIZET ) 180-10 MG TABS Take 1 tablet by mouth daily. Take 180 mg by mouth daily. 08/25/24   Zollie Lowers, MD  Blood Glucose Monitoring Suppl (ACCU-CHEK GUIDE ME) w/Device KIT TEST BLOOD SUGAR UP TO 4 TIMES DAILY DX E11.69 07/28/24   Zollie Lowers, MD  budesonide  (PULMICORT ) 0.25 MG/2ML nebulizer solution Take 2 mLs (0.25 mg total) by nebulization 2 (two) times daily. 08/03/24   Darlean Ozell NOVAK, MD  clobetasol  cream (TEMOVATE ) 0.05 % Apply 1 Application topically 2 (two) times daily. Apply to affected areas of insect bites twice daily until clear 07/20/24   Sandridge, Brenda K, PA-C  clopidogrel  (PLAVIX ) 75 MG tablet Take 1 tablet (75 mg total) by mouth daily. 05/25/24   Zollie Lowers, MD  conjugated estrogens  (PREMARIN ) vaginal cream Use 0.5 gm in vagina nightly for 1 week then 2 x weekly 11/23/22   Signa Nest A, NP  cyclobenzaprine  (FLEXERIL ) 10 MG tablet Take 1 tablet (10 mg total) by mouth 3 (three) times daily as needed for muscle spasms. 12/06/23   Dettinger, Fonda LABOR, MD  dapagliflozin  propanediol (FARXIGA ) 10 MG TABS tablet Take 1 tablet (10 mg total) by mouth daily. 06/23/24   Zollie Lowers, MD  diclofenac  Sodium (VOLTAREN ) 1 % GEL Apply 2 g topically 4 (four) times daily. 12/06/23   Dettinger, Fonda LABOR, MD  famotidine  (PEPCID ) 20 MG tablet Take 1 tablet (20 mg total) by mouth at bedtime. 10/10/23   Zollie Lowers, MD  furosemide  (LASIX ) 20 MG tablet Take 1 tablet (20 mg total) by mouth daily. 04/16/24   Zollie Lowers, MD  glucose blood (ACCU-CHEK GUIDE TEST) test strip Use as instructed 05/25/24   Zollie Lowers, MD  ofloxacin  (FLOXIN ) 0.3 % OTIC solution Place 5 drops into the left ear daily. 08/18/24  St Morton Hummer, Nena, NP  oxybutynin  (DITROPAN -XL) 5 MG 24 hr tablet Take 1 tablet (5 mg total) by mouth at bedtime. 08/12/23   Zollie Lowers, MD  pantoprazole  (PROTONIX ) 40 MG tablet Take 1 tablet (40 mg total) by mouth daily. For stomach 11/11/23   Zollie Lowers, MD  pregabalin  (LYRICA ) 150 MG capsule TAKE 1 CAPSULE BY MOUTH AT BEDTIME. 08/31/24   Zollie Lowers, MD  tirzepatide  (MOUNJARO ) 10 MG/0.5ML Pen Inject 10 mg into the skin once a week. 04/16/24   Zollie Lowers, MD  triamcinolone  cream (KENALOG ) 0.1 % Apply 1 Application topically 2 (two) times daily as needed (itchy rash). 07/29/24   Jolinda Norene HERO, DO    Allergies: Penicillins, Metformin  and  related, Gabapentin , Lisinopril, Repatha  [evolocumab ], and Soap    Review of Systems See HPI   Physical Exam   Vitals:   10/03/24 0836  BP: (!) 134/104  Pulse: 71  Resp: 18  Temp: (!) 97.5 F (36.4 C)  SpO2: 97%    CONSTITUTIONAL:  well-appearing, NAD NEURO: GCS 15. Speech is goal oriented. No deficits appreciated to CN III-XII; symmetric eyebrow raise, no facial drooping, tongue midline. Patient has equal grip strength bilaterally with 5/5 strength against resistance in all major muscle groups bilaterally. Sensation to light touch intact. Patient moves extremities without ataxia. Normal finger-nose-finger. Patient ambulatory with steady gait. EYES:  eyes equal and reactive Head: atraumatic ENT/NECK:  Supple, no stridor  CARDIO:  regular rate and rhythm, appears well-perfused  PULM:  No respiratory distress, CTAB GI/GU:  non-distended, soft, non tender MSK/SPINE:  No gross deformities, no edema, moves all extremities, abrasion to the right forearm, no edema or erythema or ecchymosis around the right elbow.  Small abrasion about the anterior medial joint line of the right knee.  Able to ambulate and bear weight.  Range of motion the upper extremities and lower extremities is normal and did not elicit tenderness. SKIN:  no rash, atraumatic  *Additional and/or pertinent findings included in MDM below    (all labs ordered are listed, but only abnormal results are displayed) Labs Reviewed - No data to display  EKG: None  Radiology: DG Elbow Complete Right Result Date: 10/03/2024 EXAM: 3 VIEW(S) XRAY OF THE ELBOW COMPARISON: None available. CLINICAL HISTORY: Fall FINDINGS: BONES AND JOINTS: No acute fracture. Small enthesophyte at the olecranon. No joint dislocation. No joint effusion. SOFT TISSUES: The soft tissues are unremarkable. IMPRESSION: 1. No acute abnormality. Electronically signed by: Waddell Calk MD 10/03/2024 09:28 AM EST RP Workstation: [id number]   DG Knee Complete  4 Views Right Result Date: 10/03/2024 EXAM: 4 VIEW(S) XRAY OF THE KNEE 10/03/2024 08:57:00 AM COMPARISON: None available. CLINICAL HISTORY: Fall FINDINGS: BONES AND JOINTS: No acute fracture. No focal osseous lesion. No joint dislocation. No significant joint effusion. No significant degenerative changes. SOFT TISSUES: The soft tissues are unremarkable. IMPRESSION: 1. No evidence of acute traumatic injury. Electronically signed by: Waddell Calk MD 10/03/2024 09:28 AM EST RP Workstation: [id number]     Procedures   Medications Ordered in the ED  acetaminophen  (TYLENOL ) tablet 1,000 mg (has no administration in time range)                                    Medical Decision Making Amount and/or Complexity of Data Reviewed Radiology: ordered.   73 year old well-appearing female presenting for right elbow and right knee pain after a fall this morning.  Exam notable for tenderness around the right elbow and right forearm abrasion and small right abrasion over the anterior knee but otherwise reassuring.  She is ambulatory.  Patient is on a blood thinner.  Patient was adamant that she did not hit her head or lose consciousness, has no signs of trauma to her head or neck and neuroexam is normal.  I have low suspicion for TBI or intracranial bleed.  X-rays are negative here.  Clean the abrasion to her right forearm and covered with a bandage.  Advised supportive treatment and follow-up with her PCP.  Discussed return precautions.  Discharge.     Final diagnoses:  Fall, initial encounter    ED Discharge Orders     None          Lang Norleen POUR, PA-C 10/03/24 0945    Kammerer, Duwaine CROME, DO 10/04/24 [phone]

## 2024-10-08 ENCOUNTER — Other Ambulatory Visit: Payer: Self-pay | Admitting: Family Medicine

## 2024-10-08 DIAGNOSIS — I10 Essential (primary) hypertension: Secondary | ICD-10-CM

## 2024-10-08 DIAGNOSIS — N1831 Chronic kidney disease, stage 3a: Secondary | ICD-10-CM

## 2024-10-13 ENCOUNTER — Other Ambulatory Visit: Payer: Self-pay | Admitting: *Deleted

## 2024-10-19 ENCOUNTER — Encounter: Payer: Self-pay | Admitting: Family Medicine

## 2024-10-19 ENCOUNTER — Ambulatory Visit: Admitting: Family Medicine

## 2024-10-19 ENCOUNTER — Ambulatory Visit: Payer: Self-pay | Admitting: Family Medicine

## 2024-10-19 VITALS — BP 138/73 | HR 70 | Temp 97.4°F | Ht 62.0 in

## 2024-10-19 DIAGNOSIS — R6889 Other general symptoms and signs: Secondary | ICD-10-CM

## 2024-10-19 LAB — VERITOR SARS-COV-2 AND FLU A+B
BD Veritor SARS-CoV-2 Ag: NEGATIVE
Influenza A: NEGATIVE
Influenza B: NEGATIVE

## 2024-10-19 LAB — CULTURE, GROUP A STREP

## 2024-10-19 LAB — RAPID STREP SCREEN (MED CTR MEBANE ONLY): Strep Gp A Ag, IA W/Reflex: NEGATIVE

## 2024-10-19 MED ORDER — LEVOFLOXACIN 500 MG PO TABS: 500.0000 mg | ORAL_TABLET | Freq: Every day | ORAL | 0 refills | Status: DC

## 2024-10-19 MED ORDER — PSEUDOEPHEDRINE-GUAIFENESIN ER 60-600 MG PO TB12: 1.0000 | ORAL_TABLET | Freq: Two times a day (BID) | ORAL | 0 refills | Status: AC

## 2024-10-19 NOTE — Progress Notes (Signed)
  Chief Complaint  Patient presents with   sick    Started Saturday with sore throat, sinus pressure, and cough. Green mucous.Just Got back from [place] and it was really cold there. 102 fever yesterday.     HPI  Patient presents today for Discussed the use of AI scribe software for clinical note transcription with the patient, who gave verbal consent to proceed.  History of Present Illness Yvonne [name] is a 73 year old female who presents with fever, cough, and respiratory symptoms.  Symptoms began two days ago following a trip to [place] , [place]. Her grandchild is also sick.  She has experienced a fever reaching up to 102F as of yesterday, along with a persistent cough, sinus pain, sore throat, ear pain, and a sensation of pressure in her head. She also reports shortness of breath, for which she performed two breathing treatments with her albuterol -based nebulizer last night, providing relief.  Her cough is productive, with green sputum noted yesterday. She has not performed a COVID test at home but underwent testing during the visit.  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     PMH: Smoking status noted Review of Systems  Objective: BP 138/73   Pulse 70   Temp (!) 97.4 F (36.3 C)   Ht 5' 2 (1.575 m)   SpO2 99%   BMI 28.90 kg/m  Gen: NAD, alert, cooperative with exam HEENT: NCAT, EOMI, PERRL CV: RRR, good S1/S2, no murmur Resp: CTABL, no wheezes, non-labored Ext: No edema, warm Neuro: Alert and oriented, No gross deficits  Flu-like symptoms -     Veritor SARS-CoV-2 and Flu A+B -     Rapid Strep Screen ([facility] ONLY)  Other orders -     levoFLOXacin ; Take 1 tablet (500 mg total) by mouth daily. For 10 days  Dispense: 10 tablet; Refill: 0 -     Pseudoephedrine -guaiFENesin  ER; Take 1 tablet by mouth every 12 (twelve) hours for 10 days. As needed for congestion  Dispense: 20 tablet; Refill:  0    Assessment and Plan Assessment & Plan Acute upper respiratory infection with fever and productive cough   She presents with an acute upper respiratory infection characterized by fever up to 102F, productive cough with green sputum, sore throat, sinus pain, and ear pain. Symptoms began two days ago. A COVID test was performed in the office. Continue albuterol -based nebulizer treatments four times a day.  Shortness of breath   Her shortness of breath is relieved by albuterol  nebulizer treatments. Continue albuterol -based nebulizer treatments four times a day.  Follow up as needed if sx fail to resolve promptly.     Butler Der, MD

## 2024-10-22 ENCOUNTER — Other Ambulatory Visit: Payer: Self-pay | Admitting: Family Medicine

## 2024-10-22 DIAGNOSIS — M7989 Other specified soft tissue disorders: Secondary | ICD-10-CM

## 2024-11-03 ENCOUNTER — Emergency Department (HOSPITAL_COMMUNITY)

## 2024-11-03 ENCOUNTER — Observation Stay (HOSPITAL_COMMUNITY)
Admission: EM | Admit: 2024-11-03 | Discharge: 2024-11-04 | Disposition: A | Discharge status: Home / Self Care | Attending: Hospitalist | Admitting: Hospitalist

## 2024-11-03 ENCOUNTER — Encounter (HOSPITAL_COMMUNITY): Payer: Self-pay | Admitting: Emergency Medicine

## 2024-11-03 ENCOUNTER — Other Ambulatory Visit: Payer: Self-pay

## 2024-11-03 DIAGNOSIS — E119 Type 2 diabetes mellitus without complications: Secondary | ICD-10-CM | POA: Insufficient documentation

## 2024-11-03 DIAGNOSIS — Z7901 Long term (current) use of anticoagulants: Secondary | ICD-10-CM | POA: Diagnosis not present

## 2024-11-03 DIAGNOSIS — R4781 Slurred speech: Secondary | ICD-10-CM | POA: Diagnosis present

## 2024-11-03 DIAGNOSIS — Z79899 Other long term (current) drug therapy: Secondary | ICD-10-CM | POA: Diagnosis not present

## 2024-11-03 DIAGNOSIS — I6629 Occlusion and stenosis of unspecified posterior cerebral artery: Secondary | ICD-10-CM | POA: Insufficient documentation

## 2024-11-03 DIAGNOSIS — K219 Gastro-esophageal reflux disease without esophagitis: Secondary | ICD-10-CM | POA: Diagnosis not present

## 2024-11-03 DIAGNOSIS — I69351 Hemiplegia and hemiparesis following cerebral infarction affecting right dominant side: Secondary | ICD-10-CM | POA: Insufficient documentation

## 2024-11-03 DIAGNOSIS — G8194 Hemiplegia, unspecified affecting left nondominant side: Principal | ICD-10-CM | POA: Insufficient documentation

## 2024-11-03 DIAGNOSIS — R531 Weakness: Secondary | ICD-10-CM | POA: Diagnosis present

## 2024-11-03 DIAGNOSIS — R29818 Other symptoms and signs involving the nervous system: Secondary | ICD-10-CM | POA: Diagnosis not present

## 2024-11-03 DIAGNOSIS — I10 Essential (primary) hypertension: Secondary | ICD-10-CM | POA: Diagnosis not present

## 2024-11-03 DIAGNOSIS — F411 Generalized anxiety disorder: Secondary | ICD-10-CM | POA: Diagnosis not present

## 2024-11-03 DIAGNOSIS — E785 Hyperlipidemia, unspecified: Secondary | ICD-10-CM | POA: Diagnosis not present

## 2024-11-03 LAB — DIFFERENTIAL
Abs Immature Granulocytes: 0.02 K/uL (ref 0.00–0.07)
Basophils Absolute: 0 K/uL (ref 0.0–0.1)
Basophils Relative: 1 %
Eosinophils Absolute: 0.2 K/uL (ref 0.0–0.5)
Eosinophils Relative: 2 %
Immature Granulocytes: 0 %
Lymphocytes Relative: 48 %
Lymphs Abs: 3.4 K/uL (ref 0.7–4.0)
Monocytes Absolute: 0.6 K/uL (ref 0.1–1.0)
Monocytes Relative: 9 %
Neutro Abs: 2.9 K/uL (ref 1.7–7.7)
Neutrophils Relative %: 40 %

## 2024-11-03 LAB — CBC
HCT: 36.1 % (ref 36.0–46.0)
Hemoglobin: 12.4 g/dL (ref 12.0–15.0)
MCH: 30.2 pg (ref 26.0–34.0)
MCHC: 34.3 g/dL (ref 30.0–36.0)
MCV: 88 fL (ref 80.0–100.0)
Platelets: 229 K/uL (ref 150–400)
RBC: 4.1 MIL/uL (ref 3.87–5.11)
RDW: 12.6 % (ref 11.5–15.5)
WBC: 7.1 K/uL (ref 4.0–10.5)
nRBC: 0 % (ref 0.0–0.2)

## 2024-11-03 LAB — I-STAT CHEM 8, ED
BUN: 25 mg/dL — ABNORMAL HIGH (ref 8–23)
Calcium, Ion: 1.26 mmol/L (ref 1.15–1.40)
Chloride: 103 mmol/L (ref 98–111)
Creatinine, Ser: 1.2 mg/dL — ABNORMAL HIGH (ref 0.44–1.00)
Glucose, Bld: 136 mg/dL — ABNORMAL HIGH (ref 70–99)
HCT: 34 % — ABNORMAL LOW (ref 36.0–46.0)
Hemoglobin: 11.6 g/dL — ABNORMAL LOW (ref 12.0–15.0)
Potassium: 3.7 mmol/L (ref 3.5–5.1)
Sodium: 139 mmol/L (ref 135–145)
TCO2: 27 mmol/L (ref 22–32)

## 2024-11-03 LAB — COMPREHENSIVE METABOLIC PANEL WITH GFR
ALT: 21 U/L (ref 0–44)
AST: 30 U/L (ref 15–41)
Albumin: 3.9 g/dL (ref 3.5–5.0)
Alkaline Phosphatase: 94 U/L (ref 38–126)
Anion gap: 5 (ref 5–15)
BUN: 24 mg/dL — ABNORMAL HIGH (ref 8–23)
CO2: 31 mmol/L (ref 22–32)
Calcium: 9.5 mg/dL (ref 8.9–10.3)
Chloride: 103 mmol/L (ref 98–111)
Creatinine, Ser: 0.98 mg/dL (ref 0.44–1.00)
GFR, Estimated: >60 mL/min (ref >60)
Glucose, Bld: 135 mg/dL — ABNORMAL HIGH (ref 70–99)
Potassium: 3.9 mmol/L (ref 3.5–5.1)
Sodium: 139 mmol/L (ref 135–145)
Total Bilirubin: 0.3 mg/dL (ref 0.0–1.2)
Total Protein: 6.3 g/dL — ABNORMAL LOW (ref 6.5–8.1)

## 2024-11-03 LAB — APTT: aPTT: 30 s (ref 24–36)

## 2024-11-03 LAB — CBG MONITORING, ED
Glucose-Capillary: 134 mg/dL — ABNORMAL HIGH (ref 70–99)
Glucose-Capillary: 130 mg/dL — ABNORMAL HIGH (ref 70–99)

## 2024-11-03 LAB — ETHANOL: Alcohol, Ethyl (B): <15 mg/dL (ref <15)

## 2024-11-03 LAB — PROTIME-INR
INR: 0.9 (ref 0.8–1.2)
Prothrombin Time: 12.6 s (ref 11.4–15.2)

## 2024-11-03 MED ORDER — IOHEXOL 350 MG/ML SOLN
75.0000 mL | Freq: Once | INTRAVENOUS | Status: AC | PRN
  Administered 2024-11-03: 75 mL via INTRAVENOUS

## 2024-11-03 MED ORDER — IOHEXOL 300 MG/ML  SOLN: 75.0000 mL | Freq: Once | INTRAMUSCULAR | Status: DC | PRN

## 2024-11-03 NOTE — ED Triage Notes (Signed)
 Pt bib [facility] for left sided weakness and facial droop. LKN about 2 hours ago. Pt has hx of previous strokes. Pt states she feels drunk.

## 2024-11-03 NOTE — Consult Note (Addendum)
 [facility] Consult Services   Patient Name:   Yvonne [name], Yvonne [name] Date of Birth:   01-23-1951 Identification Number:   MRN - [id number] Date of Service:   11/03/2024 22:13:58  Diagnosis:       I63.89 - Cerebrovascular accident (CVA) due to other mechanism ([facility])  Impression:      Left Hemiparesis and Hemiparesthesia:  ddx: CVA vs TIA vs stroke mimics    - 73 year old woman with history of previous stroke with residual right-sided weakness presents with new onset left-sided weakness and slurred speech beginning 200 hours ago. Last known well 200 hours ago. Neurological examination reveals NIH stroke scale of 4 with left arm and leg drift, mild ataxia, and subjective speech changes. Given her history of previous stroke with residual right-sided weakness, this represents a new cerebrovascular event affecting the opposite side. Given the patient is on Eliquis  anticoagulation, there is concern for possible intracranial hemorrhage as the etiology of her symptoms.   - Admit to neurology floor for stroke work-up including lab works (CBC, CMP, TSH, UA, fasting lipid profile, and HbA1C),MRI brain stroke  Vessel imaging CTA/MRA head and neck w/o to evaluate extra/intracranial vasculature integrity  2D echo to check Ejection fraction, wall motion defects, intracardiac thrombi  Q4hour Neurochecks  Monitor 48 hours under telemetry for arrhythmia  Allow for permissive hypertension and hold all antihypertensives unless BP exceeds 220 mmHg systolic or 120 mmHg diastolic for which will treat with labetalol  pushes  IVF 0.9NS @100  cc/hr (if needed)  Replete any deficiencies as needed and provide appropriate supplementation recommendations.  Hold anticoagulation until complete MRI. IF new CVA demonstrated, consider holding aniticoagulation due to increased risk of hemorrhagic conversion.  May resume if MRI negative.  PT/OT/SLP/PM&R evaluation  If failed swallow, insert NGT and start  feeds w/i 48 hrs.  Maintain euthermia, euvolemia, euglycemia.  Our recommendations are outlined below.  Recommendations:        Stroke/Telemetry Floor       Neuro Checks (Q4)       Bedside Swallow Eval       DVT Prophylaxis       IV Fluids, Normal Saline       Head of Bed 30 Degrees       Euglycemia and Avoid Hyperthermia (PRN Acetaminophen )  Sign Out:       Discussed with Emergency Department Provider    ------------------------------------------------------------------------------  Advanced Imaging: CTA Head and Neck Completed.  LVO:No  Patient is not a candidate for NIR   Metrics: Last Known Well: 11/03/2024 20:00:00 Arrival Time: 11/03/2024 22:12:09 Activation Time: 11/03/2024 22:13:58 Initial Response Time: 11/03/2024 22:15:01 Symptoms: left sided weakness and slurred speech. Initial patient interaction: 11/03/2024 22:22:37 NIHSS Assessment Completed: 11/03/2024 77:71:75 Patient is not a candidate for Thrombolytic. Thrombolytic Medical Decision: 11/03/2024 22:28:31 Patient was not deemed candidate for Thrombolytic because of following reasons: Use of NOAC in last 48 hrs. .  CT Head: I personally reviewed all the CT images that were available to me and it showed: no acute finding  Primary Provider Notified of Diagnostic Impression and Management Plan on: 11/03/2024 22:32:53    ------------------------------------------------------------------------------  History of Present Illness: Patient is a 73 year old Female.  Patient was brought by EMS for symptoms of left sided weakness and slurred speech. The patient is a 73 year old woman with a medical history of previous stroke with residual right-sided weakness, heart failure, coronary artery disease, hypertension, hyperlipidemia, and diabetes who presents to the ED with complaints of slurred speech and new onset  left-sided weakness that began with last known well at 2000 hours.  The patient reports that her  speech sounds as if she is drunk, though she does not have a history of drinking or drug use. The new onset left-sided weakness developed along with the slurred speech. The slurred speech has persisted throughout the episode.  The patient denies any headache or visual changes. The symptoms have remained stable since onset without improvement or worsening.    Past Medical History:      Hypertension      Diabetes Mellitus      Hyperlipidemia      Stroke  Medications:  Anticoagulant use:  Yes Eliquis  No Antiplatelet use Reviewed EMR for current medications  Allergies:  Reviewed  Social History: Alcohol Use: No Drug Use: No  Family History:  There is no family history of premature cerebrovascular disease pertinent to this consultation  ROS : 14 Points Review of Systems was performed and was negative except mentioned in HPI.  Past Surgical History: There Is No Surgical History Contributory To Today's Visit     Examination: BP(214/107), Pulse(88), 1A: Level of Consciousness - Alert; keenly responsive + 0 1B: Ask Month and Age - Both Questions Right + 0 1C: Blink Eyes & Squeeze Hands - Performs Both Tasks + 0 2: Test Horizontal Extraocular Movements - Normal + 0 3: Test Visual Fields - No Visual Loss + 0 4: Test Facial Palsy (Use Grimace if Obtunded) - Minor paralysis (flat nasolabial fold, smile asymmetry) + 1 5A: Test Left Arm Motor Drift - Drift, but doesn't hit bed + 1 5B: Test Right Arm Motor Drift - No Drift for 10 Seconds + 0 6A: Test Left Leg Motor Drift - Drift, but doesn't hit bed + 1 6B: Test Right Leg Motor Drift - No Drift for 5 Seconds + 0 7: Test Limb Ataxia (FNF/Heel-Shin) - Ataxia in 1 Limb + 1 8: Test Sensation - Normal; No sensory loss + 0 9: Test Language/Aphasia - Normal; No aphasia + 0 10: Test Dysarthria - Mild-Moderate Dysarthria: Slurring but can be understood + 1 11: Test Extinction/Inattention - No abnormality + 0  NIHSS Score:  5   Pre-Morbid Modified Rankin Scale: 2 Points = Slight disability; unable to carry out all previous activities, but able to look after own affairs without assistance  Spoke with : Dr. Zammit  This consult was conducted in real time using interactive audio and immunologist. Patient was informed of the technology being used for this visit and agreed to proceed. Patient located in hospital and provider located at home/office setting.   Patient is being evaluated for possible acute neurologic impairment and high probability of imminent or life-threatening deterioration. I spent total of 35 minutes providing care to this patient, including time for face to face visit via telemedicine, review of medical records, imaging studies and discussion of findings with providers, the patient and/or family.    Dr Vernis Eid   [facility] For Inpatient follow-up with [facility] physician please call [facility] at [phone]. As we are not an outpatient service for any post hospital discharge needs please contact the hospital for assistance. If you have any questions for the [facility] physicians or need to reconsult for clinical or diagnostic changes please contact us  via [facility] at [phone].  Non-radiologist review of imaging performed to assist with emergent clinical decision-making. Remote physician workstations do not possess the same resolution, calibration, or diagnostic capabilities as hospital-based radiology reading stations, and formal radiologist read is necessary.  Signature : Giavanni Zeitlin

## 2024-11-03 NOTE — ED Provider Notes (Signed)
 [place] EMERGENCY DEPARTMENT AT [facility] Provider Note   CSN: [id number] Arrival date & time: 11/03/24  2212  An emergency department physician performed an initial assessment on this suspected stroke patient at 2213.  Patient presents with: Code Stroke   Yvonne [name] is a 73 y.o. female.  {Add pertinent medical, surgical, social history, OB history to YEP:67052} Patient with a history of previous stroke on the right.  Patient is on Eliquis .  She started to have some numbness in her left arm   Weakness      Prior to Admission medications   Medication Sig Start Date End Date Taking? 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 08/03/24   Darlean Ozell NOVAK, MD  ALPRAZolam  (XANAX ) 1 MG tablet ! Each morning, one each evening, and 1/2 each afternoon 04/16/24   Zollie Lowers, MD  amLODipine  (NORVASC ) 10 MG tablet TAKE 1 TABLET BY MOUTH EVERY DAY 10/08/24   Zollie Lowers, MD  apixaban  (ELIQUIS ) 5 MG TABS tablet Take 1 tablet (5 mg total) by mouth 2 (two) times daily. 11/28/23   Zollie Lowers, MD  atorvastatin  (LIPITOR ) 80 MG tablet TAKE 1 TABLET BY MOUTH EVERY DAY 10/08/24   Zollie Lowers, MD  Bempedoic Acid-Ezetimibe  (NEXLIZET ) 180-10 MG TABS Take 1 tablet by mouth daily. Take 180 mg by mouth daily. 08/25/24   Zollie Lowers, MD  Blood Glucose Monitoring Suppl (ACCU-CHEK GUIDE ME) w/Device KIT TEST BLOOD SUGAR UP TO 4 TIMES DAILY DX E11.69 07/28/24   Zollie Lowers, MD  budesonide  (PULMICORT ) 0.25 MG/2ML nebulizer solution Take 2 mLs (0.25 mg total) by nebulization 2 (two) times daily. 08/03/24   Darlean Ozell NOVAK, MD  clobetasol  cream (TEMOVATE ) 0.05 % Apply 1 Application topically 2 (two) times daily. Apply to affected areas of insect bites twice daily until clear 07/20/24    Orman America K, PA-C  clopidogrel  (PLAVIX ) 75 MG tablet Take 1 tablet (75 mg total) by mouth daily. 05/25/24   Zollie Lowers, MD  conjugated estrogens  (PREMARIN ) vaginal cream Use 0.5 gm in vagina nightly for 1 week then 2 x weekly 11/23/22   Signa Delon LABOR, NP  cyclobenzaprine  (FLEXERIL ) 10 MG tablet Take 1 tablet (10 mg total) by mouth 3 (three) times daily as needed for muscle spasms. 12/06/23   Dettinger, Fonda LABOR, MD  dapagliflozin  propanediol (FARXIGA ) 10 MG TABS tablet Take 1 tablet (10 mg total) by mouth daily. 06/23/24   Zollie Lowers, MD  diclofenac  Sodium (VOLTAREN ) 1 % GEL Apply 2 g topically 4 (four) times daily. 12/06/23   Dettinger, Fonda LABOR, MD  famotidine  (PEPCID ) 20 MG tablet TAKE 1 TABLET BY MOUTH EVERYDAY AT BEDTIME 10/26/24   Zollie Lowers, MD  furosemide  (LASIX ) 20 MG tablet TAKE 1 TABLET BY MOUTH EVERY DAY 10/26/24   Zollie Lowers, MD  glucose blood (ACCU-CHEK GUIDE TEST) test strip Use as instructed 05/25/24   Zollie Lowers, MD  levofloxacin  (LEVAQUIN ) 500 MG tablet Take 1 tablet (500 mg total) by mouth daily. For 10 days 10/19/24   Zollie Lowers, MD  ofloxacin  (FLOXIN ) 0.3 % OTIC solution Place 5 drops into the left ear daily. Patient not taking: Reported on 10/19/2024 08/18/24   Deitra Morton Sebastian Nena, NP  oxybutynin  (DITROPAN -XL) 5 MG 24 hr tablet Take 1 tablet (5 mg total)  by mouth at bedtime. 08/12/23   Zollie Lowers, MD  pantoprazole  (PROTONIX ) 40 MG tablet TAKE 1 TABLET (40 MG TOTAL) BY MOUTH DAILY. FOR STOMACH 10/26/24   Zollie Lowers, MD  pregabalin  (LYRICA ) 150 MG capsule TAKE 1 CAPSULE BY MOUTH AT BEDTIME. 08/31/24   Zollie Lowers, MD  tirzepatide  (MOUNJARO ) 10 MG/0.5ML Pen Inject 10 mg into the skin once a week. 04/16/24   Zollie Lowers, MD  triamcinolone  cream (KENALOG ) 0.1 % Apply 1 Application topically 2 (two) times daily as needed (itchy rash). 07/29/24   Jolinda Norene HERO, DO    Allergies: Penicillins, Metformin  and related, Gabapentin ,  Lisinopril, Repatha  [evolocumab ], and Soap    Review of Systems  Neurological:  Positive for weakness.    Updated Vital Signs BP (!) 176/82   Pulse 65   Temp 98 F (36.7 C)   Resp 20   Ht 5' 2 (1.575 m)   Wt 71 kg   SpO2 96%   BMI 28.63 kg/m   Physical Exam  (all labs ordered are listed, but only abnormal results are displayed) Labs Reviewed  COMPREHENSIVE METABOLIC PANEL WITH GFR - Abnormal; Notable for the following components:      Result Value   Glucose, Bld 135 (*)    BUN 24 (*)    Total Protein 6.3 (*)    All other components within normal limits  I-STAT CHEM 8, ED - Abnormal; Notable for the following components:   BUN 25 (*)    Creatinine, Ser 1.20 (*)    Glucose, Bld 136 (*)    Hemoglobin 11.6 (*)    HCT 34.0 (*)    All other components within normal limits  CBG MONITORING, ED - Abnormal; Notable for the following components:   Glucose-Capillary 134 (*)    All other components within normal limits  CBG MONITORING, ED - Abnormal; Notable for the following components:   Glucose-Capillary 130 (*)    All other components within normal limits  PROTIME-INR  APTT  CBC  ETHANOL  DIFFERENTIAL  URINE DRUG SCREEN    EKG: None  Radiology: CT HEAD CODE STROKE WO CONTRAST (LKW 0-4.5h, LVO 0-24h) Result Date: 11/03/2024 EXAM: CT HEAD WITHOUT 11/03/2024 10:21:11 PM TECHNIQUE: CT of the head was performed without the administration of intravenous contrast. Automated exposure control, iterative reconstruction, and/or weight based adjustment of the mA/kV was utilized to reduce the radiation dose to as low as reasonably achievable. COMPARISON: CT head 04/12/2023 CLINICAL HISTORY: Neuro deficit, acute, stroke suspected FINDINGS: BRAIN AND VENTRICLES: No acute intracranial hemorrhage. No mass effect or midline shift. No extra-axial fluid collection. No evidence of acute infarct. No hydrocephalus. ORBITS: No acute abnormality. SINUSES AND MASTOIDS: No acute abnormality. SOFT  TISSUES AND SKULL: No acute skull fracture. No acute soft tissue abnormality. Findings discussed with Dr. Artice Bergerson via telephone at 10:31 PM. IMPRESSION: 1. No acute intracranial abnormality.  ASPECTS 10. Electronically signed by: Gilmore Molt MD 11/03/2024 10:32 PM EST RP Workstation: [id number]    {Document cardiac monitor, telemetry assessment procedure when appropriate:32947} Procedures   Medications Ordered in the ED  iohexol  (OMNIPAQUE ) 350 MG/ML injection 75 mL (75 mLs Intravenous Contrast Given 11/03/24 2312)   Code stroke was called and the patient was seen by neurology.  CT of the head was unremarkable.  Neurology wanted patient admitted for stroke workup with MRI   {Click here for ABCD2, HEART and other calculators REFRESH Note before signing:1}  Medical Decision Making Amount and/or Complexity of Data Reviewed Labs: ordered. Radiology: ordered.  Risk Prescription drug management. Decision regarding hospitalization.  Possible acute stroke.  Patient will be admitted to medicine  and have stroke workup  {Document critical care time when appropriate  Document review of labs and clinical decision tools ie CHADS2VASC2, etc  Document your independent review of radiology images and any outside records  Document your discussion with family members, caretakers and with consultants  Document social determinants of health affecting pt's care  Document your decision making why or why not admission, treatments were needed:32947:::1}   Final diagnoses:  None    ED Discharge Orders     None

## 2024-11-03 NOTE — H&P (Incomplete)
 History and Physical  Yvonne [name] FMW:[id number] DOB: [birth year] DOA: 11/03/2024  Referring physician: ***  PCP: Zollie Lowers, MD  Outpatient Specialists: *** Patient coming from: *** & is able to ambulate ***  Chief Complaint: ***   HPI: Yvonne [name] is a 73 y.o. female with medical history significant for *** (For level 3, the HPI must include 4+ descriptors: Location, Quality, Severity, Duration, Timing, Context, modifying factors, associated signs/symptoms and/or status of 3+ chronic problems.)   ED Course: ***  Review of Systems: Review of systems as noted in the HPI. All other systems reviewed and are negative.   Past Medical History:  Diagnosis Date   (HFpEF) heart failure with preserved ejection fraction (HCC)    Anxiety    Aortic atherosclerosis    Arthritis    left hand   Asthma    daily and prn inhalers   CAD (coronary artery disease)    Carotid artery disease    Chronic cough    Chronic otitis media 12/2017   Full dentures    GERD (gastroesophageal reflux disease)    History of stroke 09/2017   weakness right hand, numbness right side face   Hyperlipidemia    Hypertension    states under control with meds., has been on med. x a long time, per pt.   Non-insulin  dependent type 2 diabetes mellitus (HCC)    Orthostatic hypotension    Overactive bladder    PSVT (paroxysmal supraventricular tachycardia)    Recurrent acute suppurative otitis media without spontaneous rupture of left tympanic membrane 02/19/2018   Stroke (HCC)    Transient cerebral ischemia    Past Surgical History:  Procedure Laterality Date   ABDOMINAL HYSTERECTOMY     complete   CATARACT EXTRACTION W/ INTRAOCULAR LENS IMPLANT Right    CHOLECYSTECTOMY     COLONOSCOPY N/A 08/17/2015   Procedure: COLONOSCOPY;  Surgeon: Claudis RAYMOND Rivet, MD;  Location: [facility];  Service: Endoscopy;  Laterality: N/A;  200 - moved to 7:30 - Ann notified pt   GAS INSERTION Right    x 2 - eye   IR ANGIO  INTRA EXTRACRAN SEL INTERNAL CAROTID BILAT MOD SED  10/22/2023   IR ANGIO VERTEBRAL SEL VERTEBRAL UNI L MOD SED  10/22/2023   LACRIMAL DUCT EXPLORATION Bilateral    removal of tear ducts   MYRINGOTOMY WITH TUBE PLACEMENT Bilateral 01/28/2018   Procedure: BILATERAL MYRINGOTOMY WITH TUBE PLACEMENT;  Surgeon: Karis Clunes, MD;  Location: [facility];  Service: ENT;  Laterality: Bilateral;   VITRECTOMY Left 09/14/2015    Social History:  reports that she has never smoked. She has never used smokeless tobacco. She reports that she does not currently use alcohol. She reports that she does not use drugs.   Allergies  Allergen Reactions   Penicillins Hives and Itching    Has patient had a PCN reaction causing immediate rash, facial/tongue/throat swelling, SOB or lightheadedness with hypotension: Yes Has patient had a PCN reaction causing severe rash involving mucus membranes or skin necrosis: Unk Has patient had a PCN reaction that required hospitalization: No Has patient had a PCN reaction occurring within the last 10 years: No If all of the above answers are NO, then may proceed with Cephalosporin use.  Has patient had a PCN reaction causing immediate rash, facial/tongue/throat swelling, SOB or lightheadedness with hypotension: Yes Has patient had a PCN reaction causing severe rash involving mucus membranes or skin necrosis: No Has patient had a PCN reaction  that required hospitalization No Has patient had a PCN reaction occurring within the last 10 years: No If all of the above answers are NO, then may proceed with Cephalosporin use. Has patient had a PCN reaction causing immediate rash, facial/tongue/throat swelling, SOB or lightheadedness with hypotension: Yes Has patient had a PCN reaction causing severe rash involving mucus membranes or skin necrosis: Unk Has patient had a PCN reaction that required hospitalization: No Has patient had a PCN reaction occurring within the last  10 years: No If all of the above answers are NO, then may proceed with Cephalosporin use. Has patient had a PCN reaction causing immediate rash, facial/tongue/throat swelling, SOB or lightheadedness with hypotension: Yes Has patient had a PCN reaction causing severe rash involving mucus membranes or skin necrosis: No Has patient had a PCN reaction that required hospitalization No Has patient had a PCN reaction occurring within the last 10 years: No If all of the above answers are NO, then may proceed with Cephalosporin use. Has patient had a PCN reaction causing immediate rash, facial/tongue/throat swelling, SOB or lightheadedness with hypotension: Yes Has patient had a PCN reaction causing sev... (TRUNCATED)   Metformin  And Related Diarrhea    dizziness   Gabapentin  Other (See Comments)    Causes a lot of lethargy at a higher dose Causes a lot of lethargy at a higher dose Causes a lot of lethargy at a higher dose   Lisinopril Cough    HAS A CHRONIC COUGH AND DID NOT NOTICE IMPROVEMENT OF COUGH WHEN LISINOPRIL WAS STOPPED HAS A CHRONIC COUGH AND DID NOT NOTICE IMPROVEMENT OF COUGH WHEN LISINOPRIL WAS STOPPED HAS A CHRONIC COUGH AND DID NOT NOTICE IMPROVEMENT OF COUGH WHEN LISINOPRIL WAS STOPPED   Repatha  [Evolocumab ] Rash   Soap Rash    DIAL soap    Family History  Problem Relation Age of Onset   Breast cancer Mother    Arthritis Mother    Diabetes Mother    CVA Mother    Stroke Mother    Diabetes Father    Heart disease Father        CABG.  Does not know age of onset   Asthma Father    Hepatitis C Sister    Peripheral Artery Disease Daughter    Arthritis Brother    Cancer Brother        metastic cancer   Arthritis-Osteo Son     ***  Prior to Admission medications   Medication Sig Start Date End Date Taking? Authorizing Provider  Accu-Chek Softclix Lancets lancets Test BS up to 4 times daily Dx E11.69 05/25/24   Zollie Lowers, MD  albuterol  (PROVENTIL ) (2.5 MG/3ML)  0.083% nebulizer solution Take 3 mLs (2.5 mg total) by nebulization 2 (two) times daily. 08/03/24   Darlean Ozell NOVAK, MD  ALPRAZolam  (XANAX ) 1 MG tablet ! Each morning, one each evening, and 1/2 each afternoon 04/16/24   Zollie Lowers, MD  amLODipine  (NORVASC ) 10 MG tablet TAKE 1 TABLET BY MOUTH EVERY DAY 10/08/24   Zollie Lowers, MD  apixaban  (ELIQUIS ) 5 MG TABS tablet Take 1 tablet (5 mg total) by mouth 2 (two) times daily. 11/28/23   Zollie Lowers, MD  atorvastatin  (LIPITOR ) 80 MG tablet TAKE 1 TABLET BY MOUTH EVERY DAY 10/08/24   Zollie Lowers, MD  Bempedoic Acid-Ezetimibe  (NEXLIZET ) 180-10 MG TABS Take 1 tablet by mouth daily. Take 180 mg by mouth daily. 08/25/24   Zollie Lowers, MD  Blood Glucose Monitoring Suppl (ACCU-CHEK GUIDE ME) w/Device  KIT TEST BLOOD SUGAR UP TO 4 TIMES DAILY DX E11.69 07/28/24   Zollie Lowers, MD  budesonide  (PULMICORT ) 0.25 MG/2ML nebulizer solution Take 2 mLs (0.25 mg total) by nebulization 2 (two) times daily. 08/03/24   Darlean Ozell NOVAK, MD  clobetasol  cream (TEMOVATE ) 0.05 % Apply 1 Application topically 2 (two) times daily. Apply to affected areas of insect bites twice daily until clear 07/20/24   Sandridge, Brenda K, PA-C  clopidogrel  (PLAVIX ) 75 MG tablet Take 1 tablet (75 mg total) by mouth daily. 05/25/24   Zollie Lowers, MD  conjugated estrogens  (PREMARIN ) vaginal cream Use 0.5 gm in vagina nightly for 1 week then 2 x weekly 11/23/22   Signa Delon LABOR, NP  cyclobenzaprine  (FLEXERIL ) 10 MG tablet Take 1 tablet (10 mg total) by mouth 3 (three) times daily as needed for muscle spasms. 12/06/23   Dettinger, Fonda LABOR, MD  dapagliflozin  propanediol (FARXIGA ) 10 MG TABS tablet Take 1 tablet (10 mg total) by mouth daily. 06/23/24   Zollie Lowers, MD  diclofenac  Sodium (VOLTAREN ) 1 % GEL Apply 2 g topically 4 (four) times daily. 12/06/23   Dettinger, Fonda LABOR, MD  famotidine  (PEPCID ) 20 MG tablet TAKE 1 TABLET BY MOUTH EVERYDAY AT BEDTIME 10/26/24   Zollie Lowers, MD   furosemide  (LASIX ) 20 MG tablet TAKE 1 TABLET BY MOUTH EVERY DAY 10/26/24   Zollie Lowers, MD  glucose blood (ACCU-CHEK GUIDE TEST) test strip Use as instructed 05/25/24   Zollie Lowers, MD  levofloxacin  (LEVAQUIN ) 500 MG tablet Take 1 tablet (500 mg total) by mouth daily. For 10 days 10/19/24   Zollie Lowers, MD  ofloxacin  (FLOXIN ) 0.3 % OTIC solution Place 5 drops into the left ear daily. Patient not taking: Reported on 10/19/2024 08/18/24   Deitra Morton Sebastian Nena, NP  oxybutynin  (DITROPAN -XL) 5 MG 24 hr tablet Take 1 tablet (5 mg total) by mouth at bedtime. 08/12/23   Zollie Lowers, MD  pantoprazole  (PROTONIX ) 40 MG tablet TAKE 1 TABLET (40 MG TOTAL) BY MOUTH DAILY. FOR STOMACH 10/26/24   Zollie Lowers, MD  pregabalin  (LYRICA ) 150 MG capsule TAKE 1 CAPSULE BY MOUTH AT BEDTIME. 08/31/24   Zollie Lowers, MD  tirzepatide  (MOUNJARO ) 10 MG/0.5ML Pen Inject 10 mg into the skin once a week. 04/16/24   Zollie Lowers, MD  triamcinolone  cream (KENALOG ) 0.1 % Apply 1 Application topically 2 (two) times daily as needed (itchy rash). 07/29/24   Jolinda Norene HERO, DO    Physical Exam: BP (!) 176/82   Pulse 65   Temp 98 F (36.7 C)   Resp 20   Ht 5' 2 (1.575 m)   Wt 71 kg   SpO2 96%   BMI 28.63 kg/m   General: 73 y.o. year-old female well developed well nourished in no acute distress.  Alert and oriented x3. Cardiovascular: Regular rate and rhythm with no rubs or gallops.  No thyromegaly or JVD noted.  No lower extremity edema. 2/4 pulses in all 4 extremities. Respiratory: Clear to auscultation with no wheezes or rales. Good inspiratory effort. Abdomen: Soft nontender nondistended with normal bowel sounds x4 quadrants. Muskuloskeletal: No cyanosis, clubbing or edema noted bilaterally Neuro: CN II-XII intact, strength, sensation, reflexes Skin: No ulcerative lesions noted or rashes Psychiatry: Judgement and insight appear normal. Mood is appropriate for condition and setting          Labs  on Admission:  Basic Metabolic Panel: Recent Labs  Lab 11/03/24 2221  NA 139  139  K 3.9  3.7  CL 103  103  CO2 31  GLUCOSE 135*  136*  BUN 24*  25*  CREATININE 0.98  1.20*  CALCIUM  9.5   Liver Function Tests: Recent Labs  Lab 11/03/24 2221  AST 30  ALT 21  ALKPHOS 94  BILITOT 0.3  PROT 6.3*  ALBUMIN 3.9   No results for input(s): LIPASE, AMYLASE in the last 168 hours. No results for input(s): AMMONIA in the last 168 hours. CBC: Recent Labs  Lab 11/03/24 2221  WBC 7.1  NEUTROABS 2.9  HGB 12.4  11.6*  HCT 36.1  34.0*  MCV 88.0  PLT 229   Cardiac Enzymes: No results for input(s): CKTOTAL, CKMB, CKMBINDEX, TROPONINI in the last 168 hours.  BNP (last 3 results) Recent Labs    08/03/24 1018  BNP 99.7    ProBNP (last 3 results) No results for input(s): PROBNP in the last 8760 hours.  CBG: Recent Labs  Lab 11/03/24 2219 11/03/24 2231  GLUCAP 134* 130*    Radiological Exams on Admission: CT HEAD CODE STROKE WO CONTRAST (LKW 0-4.5h, LVO 0-24h) Result Date: 11/03/2024 EXAM: CT HEAD WITHOUT 11/03/2024 10:21:11 PM TECHNIQUE: CT of the head was performed without the administration of intravenous contrast. Automated exposure control, iterative reconstruction, and/or weight based adjustment of the mA/kV was utilized to reduce the radiation dose to as low as reasonably achievable. COMPARISON: CT head 04/12/2023 CLINICAL HISTORY: Neuro deficit, acute, stroke suspected FINDINGS: BRAIN AND VENTRICLES: No acute intracranial hemorrhage. No mass effect or midline shift. No extra-axial fluid collection. No evidence of acute infarct. No hydrocephalus. ORBITS: No acute abnormality. SINUSES AND MASTOIDS: No acute abnormality. SOFT TISSUES AND SKULL: No acute skull fracture. No acute soft tissue abnormality. Findings discussed with Dr. Zammit via telephone at 10:31 PM. IMPRESSION: 1. No acute intracranial abnormality.  ASPECTS 10. Electronically signed by:  Gilmore Molt MD 11/03/2024 10:32 PM EST RP Workstation: [id number]    EKG: I independently viewed the EKG done and my findings are as followed: ***   Assessment/Plan Present on Admission:  Left hemiparesis (HCC)  Principal Problem:   Left hemiparesis (HCC)   DVT prophylaxis: ***   Code Status: ***   Family Communication: ***   Disposition Plan: ***   Consults called: ***   Admission status: ***    Status is: Observation {Observation:23811}   Terry LOISE Hurst MD [facility] Pager [phone]  If 7PM-7AM, please contact night-coverage [email or web address] Password [email or web address]  11/03/2024, 11:30 PM

## 2024-11-04 ENCOUNTER — Observation Stay (HOSPITAL_COMMUNITY)

## 2024-11-04 ENCOUNTER — Observation Stay (HOSPITAL_BASED_OUTPATIENT_CLINIC_OR_DEPARTMENT_OTHER)

## 2024-11-04 DIAGNOSIS — G8194 Hemiplegia, unspecified affecting left nondominant side: Secondary | ICD-10-CM | POA: Diagnosis not present

## 2024-11-04 DIAGNOSIS — I10 Essential (primary) hypertension: Secondary | ICD-10-CM

## 2024-11-04 DIAGNOSIS — R55 Syncope and collapse: Secondary | ICD-10-CM | POA: Diagnosis not present

## 2024-11-04 DIAGNOSIS — M4802 Spinal stenosis, cervical region: Secondary | ICD-10-CM

## 2024-11-04 DIAGNOSIS — R202 Paresthesia of skin: Secondary | ICD-10-CM

## 2024-11-04 LAB — ECHOCARDIOGRAM COMPLETE
AR max vel: 3.03 cm2
AV Area VTI: 3.8 cm2
AV Area mean vel: 2.84 cm2
AV Mean grad: 3 mmHg
AV Peak grad: 5.2 mmHg
Ao pk vel: 1.14 m/s
Area-P 1/2: 2.45 cm2
Calc EF: 53.4 %
Height: 62 in
S' Lateral: 2.7 cm
Single Plane A2C EF: 52.3 %
Single Plane A4C EF: 54.6 %
Weight: 2532.64 [oz_av]

## 2024-11-04 LAB — GLUCOSE, CAPILLARY
Glucose-Capillary: 110 mg/dL — ABNORMAL HIGH (ref 70–99)
Glucose-Capillary: 137 mg/dL — ABNORMAL HIGH (ref 70–99)
Glucose-Capillary: 174 mg/dL — ABNORMAL HIGH (ref 70–99)

## 2024-11-04 LAB — URINE DRUG SCREEN
Amphetamines: NEGATIVE
Barbiturates: NEGATIVE
Benzodiazepines: NEGATIVE
Cocaine: NEGATIVE
Fentanyl: NEGATIVE
Methadone Scn, Ur: NEGATIVE
Opiates: NEGATIVE
Tetrahydrocannabinol: NEGATIVE

## 2024-11-04 LAB — LIPID PANEL
Cholesterol: 196 mg/dL (ref 0–200)
HDL: 53 mg/dL (ref >40)
LDL Cholesterol: 123 mg/dL — ABNORMAL HIGH (ref 0–99)
Total CHOL/HDL Ratio: 3.7 ratio
Triglycerides: 101 mg/dL (ref <150)
VLDL: 20 mg/dL (ref 0–40)

## 2024-11-04 LAB — HEMOGLOBIN A1C
Hgb A1c MFr Bld: 6 % — ABNORMAL HIGH (ref 4.8–5.6)
Mean Plasma Glucose: 125.5 mg/dL

## 2024-11-04 MED ORDER — LABETALOL HCL 5 MG/ML IV SOLN: 5.0000 mg | INTRAVENOUS | Status: DC | PRN

## 2024-11-04 MED ORDER — CLOPIDOGREL BISULFATE 75 MG PO TABS: 75.0000 mg | ORAL_TABLET | Freq: Every day | ORAL | 11 refills | Status: DC

## 2024-11-04 MED ORDER — CLOPIDOGREL BISULFATE 75 MG PO TABS
75.0000 mg | ORAL_TABLET | Freq: Every day | ORAL | Status: DC
  Administered 2024-11-04: 75 mg via ORAL
  Filled 2024-11-04 (×2): qty 1

## 2024-11-04 MED ORDER — ATORVASTATIN CALCIUM 40 MG PO TABS
80.0000 mg | ORAL_TABLET | Freq: Every day | ORAL | Status: DC
  Administered 2024-11-04: 80 mg via ORAL
  Filled 2024-11-04: qty 2

## 2024-11-04 MED ORDER — STROKE: EARLY STAGES OF RECOVERY BOOK: Freq: Once | Status: DC

## 2024-11-04 MED ORDER — INSULIN ASPART 100 UNIT/ML IJ SOLN
0.0000 [IU] | Freq: Three times a day (TID) | INTRAMUSCULAR | Status: DC
  Filled 2024-11-04: qty 1

## 2024-11-04 MED ORDER — ALPRAZOLAM 0.25 MG PO TABS: 0.2500 mg | ORAL_TABLET | Freq: Two times a day (BID) | ORAL | Status: DC | PRN

## 2024-11-04 MED ORDER — SODIUM CHLORIDE 0.9 % IV SOLN: INTRAVENOUS | Status: DC

## 2024-11-04 MED ORDER — PANTOPRAZOLE SODIUM 40 MG PO TBEC
40.0000 mg | DELAYED_RELEASE_TABLET | Freq: Every day | ORAL | Status: DC
  Administered 2024-11-04: 40 mg via ORAL
  Filled 2024-11-04 (×2): qty 1

## 2024-11-04 MED ORDER — INSULIN ASPART 100 UNIT/ML IJ SOLN: 0.0000 [IU] | Freq: Every day | INTRAMUSCULAR | Status: DC

## 2024-11-04 MED ORDER — PREGABALIN 75 MG PO CAPS: 150.0000 mg | ORAL_CAPSULE | Freq: Every day | ORAL | Status: DC

## 2024-11-04 MED ORDER — ASPIRIN 81 MG PO TBEC: 81.0000 mg | DELAYED_RELEASE_TABLET | Freq: Every day | ORAL | 3 refills | Status: AC

## 2024-11-04 MED ORDER — BEMPEDOIC ACID-EZETIMIBE 180-10 MG PO TABS: 1.0000 | ORAL_TABLET | Freq: Every day | ORAL | Status: DC

## 2024-11-04 MED ORDER — BUDESONIDE 0.25 MG/2ML IN SUSP
0.2500 mg | Freq: Two times a day (BID) | RESPIRATORY_TRACT | Status: DC
  Administered 2024-11-04: 0.25 mg via RESPIRATORY_TRACT
  Filled 2024-11-04: qty 2

## 2024-11-04 MED ORDER — FUROSEMIDE 20 MG PO TABS
20.0000 mg | ORAL_TABLET | Freq: Every day | ORAL | Status: DC
  Administered 2024-11-04: 20 mg via ORAL
  Filled 2024-11-04 (×2): qty 1

## 2024-11-04 MED ORDER — CLOPIDOGREL BISULFATE 75 MG PO TABS: 75.0000 mg | ORAL_TABLET | Freq: Every day | ORAL | 0 refills | Status: DC

## 2024-11-04 NOTE — Evaluation (Signed)
 Physical Therapy Evaluation Patient Details Name: Yvonne [name] MRN: [id number] DOB: [birth year] Today's Date: 11/04/2024  History of Present Illness  Yvonne [name] is a 73 y.o. female with medical history significant for prior CVA on Eliquis , type 2 diabetes, hypertension, coronary artery disease, chronic HFpEF, generalized anxiety disorder, benzodiazepine dependence, who presented to the ER with complaints of left-sided weakness and tingling.  She noted the tingling about a week ago, involving her left fingers and left toes, and felt like she was going to have another stroke.  Yesterday around 9:15 PM she was laying in bed and got up and felt weak on the left side.  Denies falling.  Associated with numbness and tingling in her fingers and toes.  She was brought into the ER for further evaluation.   Clinical Impression  Patient agreeable to OT/PT co-evaluation. Patient's husband present. Pt reports at baseline, she is independent with all mobility, transfers, and gait without AD. This date, patient remains independent with all. Patient does not demonstrate any difference between R/L sides of body with sensation, strength, or coordination. No changes in vision. Patient reports feeling at her baseline and that all previous symptoms had resolved. Patient returns to bed, family present, call button in reach, and all needs met. Patient does not present with urgent need for skilled physical therapy at this time. Patient discharged to care of nursing for ambulation daily as tolerated for length of stay.        If plan is discharge home, recommend the following:     Can travel by private vehicle        Equipment Recommendations None recommended by PT  Recommendations for Other Services       Functional Status Assessment Patient has had a recent decline in their functional status and demonstrates the ability to make significant improvements in function in a reasonable and predictable amount of time.      Precautions / Restrictions Precautions Precautions: Fall Recall of Precautions/Restrictions: Intact Restrictions Weight Bearing Restrictions Per Provider Order: No      Mobility  Bed Mobility Overal bed mobility: Independent             General bed mobility comments: HOB flat, no use of railings, normal speed    Transfers Overall transfer level: Independent Equipment used: None               General transfer comment: Sit to stand from bed without AD, no unsteadiness of note    Ambulation/Gait Ambulation/Gait assistance: Independent Gait Distance (Feet): 250 Feet Assistive device: None Gait Pattern/deviations: WFL(Within Functional Limits) Gait velocity: WNL     General Gait Details: Pt ambulates in room and hall without AD, no overt LOB or unsteadiness, good velocity, reports no dizziness, or SOB,  feeling at baseline  Stairs            Wheelchair Mobility     Tilt Bed    Modified Rankin (Stroke Patients Only)       Balance Overall balance assessment: Independent                                           Pertinent Vitals/Pain Pain Assessment Pain Assessment: No/denies pain    Home Living Family/patient expects to be discharged to:: Private residence Living Arrangements: Spouse/significant other;Children Available Help at Discharge: Family;Available PRN/intermittently Type of Home: House Home Access: Stairs to enter  Entrance Stairs-Rails: Left;Right;Can reach both Entrance Stairs-Number of Steps: 7   Home Layout: One level Home Equipment: None      Prior Function Prior Level of Function : Independent/Modified Independent;Driving             Mobility Comments: Community ambulator without AD, still driving, husband reports she stays on the go ADLs Comments: Independent.     Extremity/Trunk Assessment   Upper Extremity Assessment Upper Extremity Assessment: Defer to OT evaluation    Lower  Extremity Assessment Lower Extremity Assessment: Overall WFL for tasks assessed (Pt demonstrates no stark difference between R/LLE sensation, strength, or coordination)    Cervical / Trunk Assessment Cervical / Trunk Assessment: Normal  Communication   Communication Communication: No apparent difficulties    Cognition Arousal: Alert Behavior During Therapy: WFL for tasks assessed/performed                             Following commands: Intact       Cueing Cueing Techniques: Verbal cues, Visual cues     General Comments      Exercises     Assessment/Plan    PT Assessment Patient does not need any further PT services  PT Problem List         PT Treatment Interventions      PT Goals (Current goals can be found in the Care Plan section)  Acute Rehab PT Goals Patient Stated Goal: Go home PT Goal Formulation: With patient Time For Goal Achievement: 11/05/24 Potential to Achieve Goals: Good    Frequency       Co-evaluation PT/OT/SLP Co-Evaluation/Treatment: Yes Reason for Co-Treatment: To address functional/ADL transfers PT goals addressed during session: Mobility/safety with mobility OT goals addressed during session: ADL's and self-care       AM-PAC PT 6 Clicks Mobility  Outcome Measure Help needed turning from your back to your side while in a flat bed without using bedrails?: None Help needed moving from lying on your back to sitting on the side of a flat bed without using bedrails?: None Help needed moving to and from a bed to a chair (including a wheelchair)?: None Help needed standing up from a chair using your arms (e.g., wheelchair or bedside chair)?: None Help needed to walk in hospital room?: None Help needed climbing 3-5 steps with a railing? : None 6 Click Score: 24    End of Session   Activity Tolerance: Patient tolerated treatment well Patient left: in bed;with family/visitor present;with call bell/phone within reach   PT  Visit Diagnosis: Other symptoms and signs involving the nervous system (R29.898)    Time: 1005-1014 PT Time Calculation (min) (ACUTE ONLY): 9 min   Charges:   PT Evaluation $PT Eval Low Complexity: 1 Low   PT General Charges $$ ACUTE PT VISIT: 1 Visit         11:20 AM, 11/04/24 Rosaria Settler, PT, DPT [place] with [facility]

## 2024-11-04 NOTE — Plan of Care (Signed)
°  Problem: Education: Goal: Knowledge of General Education information will improve Description: Including pain rating scale, medication(s)/side effects and non-pharmacologic comfort measures Outcome: Progressing   Problem: Health Behavior/Discharge Planning: Goal: Ability to manage health-related needs will improve Outcome: Progressing   Problem: Clinical Measurements: Goal: Ability to maintain clinical measurements within normal limits will improve Outcome: Progressing   Problem: Activity: Goal: Risk for activity intolerance will decrease Outcome: Progressing   Problem: Pain Managment: Goal: General experience of comfort will improve and/or be controlled Outcome: Progressing   Problem: Safety: Goal: Ability to remain free from injury will improve Outcome: Progressing   Problem: Education: Goal: Knowledge of disease or condition will improve Outcome: Progressing   Problem: Ischemic Stroke/TIA Tissue Perfusion: Goal: Complications of ischemic stroke/TIA will be minimized Outcome: Progressing   Problem: Coping: Goal: Will verbalize positive feelings about self Outcome: Progressing   Problem: Self-Care: Goal: Ability to participate in self-care as condition permits will improve Outcome: Progressing   Problem: Nutrition: Goal: Risk of aspiration will decrease Outcome: Progressing   Problem: Education: Goal: Ability to describe self-care measures that may prevent or decrease complications (Diabetes Survival Skills Education) will improve Outcome: Progressing

## 2024-11-04 NOTE — Progress Notes (Signed)
°  Transition of Care [facility]) Screening Note   Patient Details  Name: Yvonne [name] Date of Birth: 04-Dec-1950   Transition of Care [facility]) CM/SW Contact:    Hoy DELENA Bigness, [name] Phone Number: 11/04/2024, 11:13 AM    Transition of Care Department [facility]) has reviewed patient and no TOC needs have been identified at this time. We will continue to monitor patient advancement through interdisciplinary progression rounds. If new patient transition needs arise, please place a TOC consult.    11/04/24 1113  TOC Brief Assessment  Insurance and Status Reviewed  Patient has primary care physician Yes  Home environment has been reviewed From home  Prior level of function: Independent/modified independent  Prior/Current Home Services No current home services  Social Drivers of Health Review SDOH reviewed no interventions necessary  Readmission risk has been reviewed Yes  Transition of care needs no transition of care needs at this time

## 2024-11-04 NOTE — Plan of Care (Signed)
   Problem: Education: Goal: Knowledge of General Education information will improve Description Including pain rating scale, medication(s)/side effects and non-pharmacologic comfort measures Outcome: Progressing

## 2024-11-04 NOTE — Progress Notes (Signed)
 SLP Cancellation Note  Patient Details Name: Yvonne [name] MRN: [id number] DOB: Jun 23, 1951   Cancelled treatment:       Reason Eval/Treat Not Completed: SLP screened, no needs identified, will sign off; MRI negative for acute changes; pt noted symptoms have resolved.   Pat [name],M.S.,CCC-SLP 11/04/2024, 1:48 PM

## 2024-11-04 NOTE — Progress Notes (Signed)
 Echocardiogram 2D Echocardiogram has been performed.  Yvonne [name] 11/04/2024, 10:06 AM

## 2024-11-04 NOTE — Discharge Summary (Signed)
 Physician Discharge Summary  Yvonne [name] FMW:[id number] DOB: 27-Jan-1951 DOA: 11/03/2024  PCP: Zollie Lowers, MD  Admit date: 11/03/2024 Discharge date: 11/04/2024  Admitted From: Home Disposition: Home  Recommendations for Outpatient Follow-up:  Follow up with PCP in 1 week  Follow-up with neurology/stroke in 2 weeks.   Referral to cardiology placed for consideration of ILR.   Follow up in ED if symptoms worsen or new appear   Home Health: No Equipment/Devices: None  Discharge Condition: Stable CODE STATUS: Full Diet recommendation: Heart healthy  Brief/Interim Summary:  Yvonne [name] is a 73 y.o. female with medical history significant for prior CVA on Eliquis , type 2 diabetes, hypertension, coronary artery disease, chronic HFpEF, generalized anxiety disorder, benzodiazepine dependence, who presented to the ER with complaints of left-sided weakness and tingling.  She noted the tingling about a week ago, involving her left fingers and left toes, and felt like she was going to have another stroke.  Yesterday around 9:15 PM she was laying in bed and got up and felt weak on the left side.  Denies falling.  Associated with numbness and tingling in her fingers and toes.  She was brought into the ER for further evaluation.   In the ER, hypertensive.  Seen by neurology/stroke team who recommended complete stroke workup.    CT angio head and neck with and without contrast revealed no large vessel occlusion.  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  Brain MRI was negative for acute stroke.  Echocardiogram was negative for PFO, thrombus.  Symptoms resolved.  Patient was evaluated by neurology.  Recommended aspirin  and Plavix  for 3 months followed by aspirin .  Patient will have follow-up with neurology as an outpatient.  Also referral to cardiology placed for consideration of ILR.  Patient was on Eliquis  prior to admission however has no history of A-fib/flutter.  This was initiated by primary  care provider last year due to recurrent TIAs.  Eliquis  was stopped per neurology recommendations.  Patient will continue prior to admission statins.  Discharge Diagnoses:  Principal Problem:   Left hemiparesis [facility])    Discharge Instructions  Discharge Instructions     Ambulatory referral to Cardiology   Complete by: As directed    Eval for ILR for recurrent TIAs/stroke   Ambulatory referral to Neurology   Complete by: As directed    An appointment is requested in approximately: 4 weeks   Call MD for:  persistant dizziness or light-headedness   Complete by: As directed    Diet - low sodium heart healthy   Complete by: As directed    Discharge instructions   Complete by: As directed    1.  Stop taking Eliquis .  Start taking aspirin  and Plavix  for 3 months followed by aspirin  alone. 2.  Follow-up with neurology/stroke as well as cardiology for possible loop recorder 3.  Return to the ER if recurrence of symptoms   Increase activity slowly   Complete by: As directed       Allergies as of 11/04/2024       Reactions   Penicillins Hives, Itching   Has patient had a PCN reaction causing immediate rash, facial/tongue/throat swelling, SOB or lightheadedness with hypotension: Yes Has patient had a PCN reaction causing severe rash involving mucus membranes or skin necrosis: Unk Has patient had a PCN reaction that required hospitalization: No Has patient had a PCN reaction occurring within the last 10 years: No If all of the above answers are NO, then may proceed  with Cephalosporin use. Has patient had a PCN reaction causing immediate rash, facial/tongue/throat swelling, SOB or lightheadedness with hypotension: Yes Has patient had a PCN reaction causing severe rash involving mucus membranes or skin necrosis: No Has patient had a PCN reaction that required hospitalization No Has patient had a PCN reaction occurring within the last 10 years: No If all of the above answers are NO,  then may proceed with Cephalosporin use. Has patient had a PCN reaction causing immediate rash, facial/tongue/throat swelling, SOB or lightheadedness with hypotension: Yes Has patient had a PCN reaction causing severe rash involving mucus membranes or skin necrosis: Unk Has patient had a PCN reaction that required hospitalization: No Has patient had a PCN reaction occurring within the last 10 years: No If all of the above answers are NO, then may proceed with Cephalosporin use. Has patient had a PCN reaction causing immediate rash, facial/tongue/throat swelling, SOB or lightheadedness with hypotension: Yes Has patient had a PCN reaction causing severe rash involving mucus membranes or skin necrosis: No Has patient had a PCN reaction that required hospitalization No Has patient had a PCN reaction occurring within the last 10 years: No If all of the above answers are NO, then may proceed with Cephalosporin use. Has patient had a PCN reaction causing immediate rash, facial/tongue/throat swelling, SOB or lightheadedness with hypotension: Yes Has patient had a PCN reaction causing sev... (TRUNCATED)   Metformin  And Related Diarrhea   dizziness   Gabapentin  Other (See Comments)   Causes a lot of lethargy at a higher dose Causes a lot of lethargy at a higher dose Causes a lot of lethargy at a higher dose   Lisinopril Cough   HAS A CHRONIC COUGH AND DID NOT NOTICE IMPROVEMENT OF COUGH WHEN LISINOPRIL WAS STOPPED HAS A CHRONIC COUGH AND DID NOT NOTICE IMPROVEMENT OF COUGH WHEN LISINOPRIL WAS STOPPED HAS A CHRONIC COUGH AND DID NOT NOTICE IMPROVEMENT OF COUGH WHEN LISINOPRIL WAS STOPPED   Repatha  [evolocumab ] Rash   Soap Rash   DIAL soap        Medication List     STOP taking these medications    apixaban  5 MG Tabs tablet Commonly known as: Eliquis        TAKE these medications    Accu-Chek Guide Me w/Device Kit TEST BLOOD SUGAR UP TO 4 TIMES DAILY 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   ALPRAZolam  1 MG tablet Commonly known as: XANAX  ! Each morning, one each evening, and 1/2 each afternoon   amLODipine  10 MG tablet Commonly known as: NORVASC  TAKE 1 TABLET BY MOUTH EVERY DAY   aspirin  EC 81 MG tablet Take 1 tablet (81 mg total) by mouth daily. Swallow whole.   atorvastatin  80 MG tablet Commonly known as: LIPITOR  TAKE 1 TABLET BY MOUTH EVERY DAY   budesonide  0.25 MG/2ML nebulizer solution Commonly known as: Pulmicort  Take 2 mLs (0.25 mg total) by nebulization 2 (two) times daily.   clobetasol  cream 0.05 % Commonly known as: TEMOVATE  Apply 1 Application topically 2 (two) times daily. Apply to affected areas of insect bites twice daily until clear   clopidogrel  75 MG tablet Commonly known as: Plavix  Take 1 tablet (75 mg total) by mouth  daily. What changed: Another medication with the same name was added. Make sure you understand how and when to take each.   clopidogrel  75 MG tablet Commonly known as: Plavix  Take 1 tablet (75 mg total) by mouth daily. What changed: You were already taking a medication with the same name, and this prescription was added. Make sure you understand how and when to take each.   cyclobenzaprine  10 MG tablet Commonly known as: FLEXERIL  Take 1 tablet (10 mg total) by mouth 3 (three) times daily as needed for muscle spasms.   dapagliflozin  propanediol 10 MG Tabs tablet Commonly known as: FARXIGA  Take 1 tablet (10 mg total) by mouth daily.   diclofenac  Sodium 1 % Gel Commonly known as: Voltaren  Apply 2 g topically 4 (four) times daily.   famotidine  20 MG tablet Commonly known as: PEPCID  TAKE 1 TABLET BY MOUTH EVERYDAY AT BEDTIME   furosemide  20  MG tablet Commonly known as: LASIX  TAKE 1 TABLET BY MOUTH EVERY DAY   levofloxacin  500 MG tablet Commonly known as: LEVAQUIN  Take 1 tablet (500 mg total) by mouth daily. For 10 days   Nexlizet  180-10 MG Tabs Generic drug: Bempedoic Acid-Ezetimibe  Take 1 tablet by mouth daily. Take 180 mg by mouth daily.   ofloxacin  0.3 % OTIC solution Commonly known as: FLOXIN  Place 5 drops into the left ear daily.   oxybutynin  5 MG 24 hr tablet Commonly known as: DITROPAN -XL Take 1 tablet (5 mg total) by mouth at bedtime.   pantoprazole  40 MG tablet Commonly known as: PROTONIX  TAKE 1 TABLET (40 MG TOTAL) BY MOUTH DAILY. FOR STOMACH   pregabalin  150 MG capsule Commonly known as: LYRICA  TAKE 1 CAPSULE BY MOUTH AT BEDTIME.   Premarin  vaginal cream Generic drug: conjugated estrogens  Use 0.5 gm in vagina nightly for 1 week then 2 x weekly   tirzepatide  10 MG/0.5ML Pen Commonly known as: MOUNJARO  Inject 10 mg into the skin once a week.   triamcinolone  cream 0.1 % Commonly known as: KENALOG  Apply 1 Application topically 2 (two) times daily as needed (itchy rash).        Follow-up Information     [name], Butler, MD. Schedule an appointment as soon as possible for a visit in 1 week(s).   Specialty: Family Medicine Contact information: [address] [place] [address] [phone]         [facility]. Go in 2 week(s).   Contact information: [address]     [address] [place]  [address] [phone]               Allergies  Allergen Reactions   Penicillins Hives and Itching    Has patient had a PCN reaction causing immediate rash, facial/tongue/throat swelling, SOB or lightheadedness with hypotension: Yes Has patient had a PCN reaction causing severe rash involving mucus membranes or skin necrosis: Unk Has patient had a PCN reaction that required hospitalization: No Has patient had a PCN reaction occurring within the last 10  years: No If all of the above answers are NO, then may proceed with Cephalosporin use.  Has patient had a PCN reaction causing immediate rash, facial/tongue/throat swelling, SOB or lightheadedness with hypotension: Yes Has patient had a PCN reaction causing severe rash involving mucus membranes or skin necrosis: No Has patient had a PCN reaction that required hospitalization No Has patient had a PCN reaction occurring within the last 10 years: No If all of the above answers are NO, then may proceed with  Cephalosporin use. Has patient had a PCN reaction causing immediate rash, facial/tongue/throat swelling, SOB or lightheadedness with hypotension: Yes Has patient had a PCN reaction causing severe rash involving mucus membranes or skin necrosis: Unk Has patient had a PCN reaction that required hospitalization: No Has patient had a PCN reaction occurring within the last 10 years: No If all of the above answers are NO, then may proceed with Cephalosporin use. Has patient had a PCN reaction causing immediate rash, facial/tongue/throat swelling, SOB or lightheadedness with hypotension: Yes Has patient had a PCN reaction causing severe rash involving mucus membranes or skin necrosis: No Has patient had a PCN reaction that required hospitalization No Has patient had a PCN reaction occurring within the last 10 years: No If all of the above answers are NO, then may proceed with Cephalosporin use. Has patient had a PCN reaction causing immediate rash, facial/tongue/throat swelling, SOB or lightheadedness with hypotension: Yes Has patient had a PCN reaction causing sev... (TRUNCATED)   Metformin  And Related Diarrhea    dizziness   Gabapentin  Other (See Comments)    Causes a lot of lethargy at a higher dose Causes a lot of lethargy at a higher dose Causes a lot of lethargy at a higher dose   Lisinopril Cough    HAS A CHRONIC COUGH AND DID NOT NOTICE IMPROVEMENT OF COUGH WHEN LISINOPRIL WAS  STOPPED HAS A CHRONIC COUGH AND DID NOT NOTICE IMPROVEMENT OF COUGH WHEN LISINOPRIL WAS STOPPED HAS A CHRONIC COUGH AND DID NOT NOTICE IMPROVEMENT OF COUGH WHEN LISINOPRIL WAS STOPPED   Repatha  [Evolocumab ] Rash   Soap Rash    DIAL soap    Consultations:    Procedures/Studies: ECHOCARDIOGRAM COMPLETE Result Date: 11/04/2024    ECHOCARDIOGRAM REPORT   Patient Name:   Yvonne [name] Date of Exam: 11/04/2024 Medical Rec #:  [id number]     Height:       62.0 in Accession #:    [id number]    Weight:       158.3 lb Date of Birth:  1951/05/26      BSA:          1.731 m Patient Age:    73 years      BP:           151/67 mmHg Patient Gender: F             HR:           62 bpm. Exam Location:  [facility] Procedure: 2D Echo, Cardiac Doppler and Color Doppler (Both Spectral and Color            Flow Doppler were utilized during procedure). Indications:    Stroke  History:        Patient has prior history of Echocardiogram examinations, most                 recent 01/17/2022. CHF, CAD, Stroke and TIA,                 Signs/Symptoms:Syncope; Risk Factors:Hypertension, Dyslipidemia                 and Diabetes.  Sonographer:    Juliene Rucks Referring Phys: [id number] TERRY LOISE HURST  Sonographer Comments: Patient is obese. IMPRESSIONS  1. Left ventricular ejection fraction, by estimation, is 60 to 65%. The left ventricle has normal function. The left ventricle has no regional wall motion abnormalities. There is mild left ventricular hypertrophy. Left ventricular diastolic parameters are consistent with  Grade I diastolic dysfunction (impaired relaxation).  2. Right ventricular systolic function is normal. The right ventricular size is normal. Tricuspid regurgitation signal is inadequate for assessing PA pressure.  3. The mitral valve is normal in structure. No evidence of mitral valve regurgitation. No evidence of mitral stenosis.  4. The aortic valve is tricuspid. Aortic valve regurgitation is not visualized. No aortic  stenosis is present.  5. The inferior vena cava is normal in size with greater than 50% respiratory variability, suggesting right atrial pressure of 3 mmHg. FINDINGS  Left Ventricle: Left ventricular ejection fraction, by estimation, is 60 to 65%. The left ventricle has normal function. The left ventricle has no regional wall motion abnormalities. The left ventricular internal cavity size was normal in size. There is  mild left ventricular hypertrophy. Left ventricular diastolic parameters are consistent with Grade I diastolic dysfunction (impaired relaxation). Normal left ventricular filling pressure. Right Ventricle: The right ventricular size is normal. Right vetricular wall thickness was not well visualized. Right ventricular systolic function is normal. Tricuspid regurgitation signal is inadequate for assessing PA pressure. Left Atrium: Left atrial size was normal in size. Right Atrium: Right atrial size was normal in size. Pericardium: There is no evidence of pericardial effusion. Mitral Valve: The mitral valve is normal in structure. No evidence of mitral valve regurgitation. No evidence of mitral valve stenosis. Tricuspid Valve: The tricuspid valve is normal in structure. Tricuspid valve regurgitation is not demonstrated. No evidence of tricuspid stenosis. Aortic Valve: The aortic valve is tricuspid. Aortic valve regurgitation is not visualized. No aortic stenosis is present. Aortic valve mean gradient measures 3.0 mmHg. Aortic valve peak gradient measures 5.2 mmHg. Aortic valve area, by VTI measures 3.80 cm. Pulmonic Valve: The pulmonic valve was not well visualized. Pulmonic valve regurgitation is not visualized. No evidence of pulmonic stenosis. Aorta: The aortic root and ascending aorta are structurally normal, with no evidence of dilitation. Venous: The inferior vena cava is normal in size with greater than 50% respiratory variability, suggesting right atrial pressure of 3 mmHg. IAS/Shunts: No atrial  level shunt detected by color flow Doppler.  LEFT VENTRICLE PLAX 2D LVIDd:         3.60 cm     Diastology LVIDs:         2.70 cm     LV e' medial:    4.70 cm/s LV PW:         1.20 cm     LV E/e' medial:  14.9 LV IVS:        1.20 cm     LV e' lateral:   6.57 cm/s LVOT diam:     2.00 cm     LV E/e' lateral: 10.7 LV SV:         79 LV SV Index:   46 LVOT Area:     3.14 cm  LV Volumes (MOD) LV vol d, MOD A2C: 99.2 ml LV vol d, MOD A4C: 94.8 ml LV vol s, MOD A2C: 47.3 ml LV vol s, MOD A4C: 43.0 ml LV SV MOD A2C:     51.9 ml LV SV MOD A4C:     94.8 ml LV SV MOD BP:      51.9 ml RIGHT VENTRICLE RV Basal diam:  2.90 cm RV Mid diam:    2.50 cm RV S prime:     9.58 cm/s TAPSE (M-mode): 1.9 cm LEFT ATRIUM             Index  RIGHT ATRIUM           Index LA diam:        3.00 cm 1.73 cm/m   RA Area:     10.30 cm LA Vol (A2C):   41.3 ml 23.86 ml/m  RA Volume:   19.20 ml  11.09 ml/m LA Vol (A4C):   36.1 ml 20.86 ml/m LA Biplane Vol: 40.2 ml 23.23 ml/m  AORTIC VALVE AV Area (Vmax):    3.03 cm AV Area (Vmean):   2.84 cm AV Area (VTI):     3.80 cm AV Vmax:           113.90 cm/s AV Vmean:          81.120 cm/s AV VTI:            0.209 m AV Peak Grad:      5.2 mmHg AV Mean Grad:      3.0 mmHg LVOT Vmax:         110.00 cm/s LVOT Vmean:        73.400 cm/s LVOT VTI:          0.252 m LVOT/AV VTI ratio: 1.21  AORTA Ao Root diam: 2.90 cm Ao Asc diam:  3.40 cm MITRAL VALVE MV Area (PHT): 2.45 cm     SHUNTS MV Decel Time: 310 msec     Systemic VTI:  0.25 m MV E velocity: 70.20 cm/s   Systemic Diam: 2.00 cm MV A velocity: 107.00 cm/s MV E/A ratio:  0.66 Dorn Ross MD Electronically signed by Dorn Ross MD Signature Date/Time: 11/04/2024/2:43:45 PM    Final    MR BRAIN WO CONTRAST Result Date: 11/04/2024 EXAM: MRI BRAIN WITHOUT CONTRAST 11/04/2024 09:02:33 AM TECHNIQUE: Multiplanar multisequence MRI of the head/brain was performed without the administration of intravenous contrast. COMPARISON: MRI of the head dated  04/16/2024. CLINICAL HISTORY: Neuro deficit, acute, stroke suspected. FINDINGS: BRAIN AND VENTRICLES: Chronic acute infarcts are again noted within the left thalamus and corona radiata. There is mild periventricular white matter disease. No intracranial hemorrhage. No mass. No midline shift. No hydrocephalus. The sella is unremarkable. Normal flow voids. ORBITS: Status post right lens replacement. No acute abnormality. SINUSES AND MASTOIDS: Right mastoid air cell effusion. Mild mucosal disease within the left maxillary sinus, which is hypoplastic. BONES AND SOFT TISSUES: Normal marrow signal. No acute soft tissue abnormality. IMPRESSION: 1. No acute intracranial abnormality. 2. Chronic infarcts in the left thalamus and corona radiata. 3. Mild periventricular white matter disease. Electronically signed by: Evalene Coho MD 11/04/2024 09:09 AM EST RP Workstation: [id number]   CT ANGIO HEAD NECK W WO CM Result Date: 11/03/2024 EXAM: CTA HEAD AND NECK WITH AND WITHOUT 11/03/2024 11:20:00 PM TECHNIQUE: CTA of the head and neck was performed with and without the administration of intravenous contrast. Multiplanar 2D and/or 3D reformatted images are provided for review. Automated exposure control, iterative reconstruction, and/or weight based adjustment of the mA/kV was utilized to reduce the radiation dose to as low as reasonably achievable. Stenosis of the internal carotid arteries measured using NASCET criteria. COMPARISON: None available CLINICAL HISTORY: Stroke, follow up FINDINGS: AORTIC ARCH AND ARCH VESSELS: No dissection or arterial injury. No significant stenosis of the brachiocephalic or subclavian arteries. CERVICAL CAROTID ARTERIES: No dissection, arterial injury, or hemodynamically significant stenosis by NASCET criteria. CERVICAL VERTEBRAL ARTERIES: No dissection, arterial injury, or significant stenosis. LUNGS AND MEDIASTINUM: Unremarkable. SOFT TISSUES: No acute abnormality. BONES: No acute  abnormality. ANTERIOR CIRCULATION: No significant stenosis of the internal  carotid arteries. No significant stenosis of the anterior cerebral arteries. No significant stenosis of the middle cerebral arteries. No aneurysm. POSTERIOR CIRCULATION: Patent PCAs. 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 No significant stenosis of the basilar artery. No significant stenosis of the vertebral arteries. No aneurysm. OTHER: No dural venous sinus thrombosis on this non-dedicated study. IMPRESSION: 1. No large vessel occlusion. 2. 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 Electronically signed by: Gilmore Molt MD 11/03/2024 11:37 PM EST RP Workstation: [id number]   CT HEAD CODE STROKE WO CONTRAST (LKW 0-4.5h, LVO 0-24h) Result Date: 11/03/2024 EXAM: CT HEAD WITHOUT 11/03/2024 10:21:11 PM TECHNIQUE: CT of the head was performed without the administration of intravenous contrast. Automated exposure control, iterative reconstruction, and/or weight based adjustment of the mA/kV was utilized to reduce the radiation dose to as low as reasonably achievable. COMPARISON: CT head 04/12/2023 CLINICAL HISTORY: Neuro deficit, acute, stroke suspected FINDINGS: BRAIN AND VENTRICLES: No acute intracranial hemorrhage. No mass effect or midline shift. No extra-axial fluid collection. No evidence of acute infarct. No hydrocephalus. ORBITS: No acute abnormality. SINUSES AND MASTOIDS: No acute abnormality. SOFT TISSUES AND SKULL: No acute skull fracture. No acute soft tissue abnormality. Findings discussed with Dr. Zammit via telephone at 10:31 PM. IMPRESSION: 1. No acute intracranial abnormality.  ASPECTS 10. Electronically signed by: Gilmore Molt MD 11/03/2024 10:32 PM EST RP Workstation: [id number]      Subjective:   Discharge Exam: Vitals:   11/04/24 1038 11/04/24 1528  BP: (!) 163/71 (!) 149/73  Pulse: 61 75  Resp: 18 17  Temp: 98.7 F (37.1 C) 97.9 F (36.6 C)  SpO2: 97% 95%    General: Pt is alert, awake, not in  acute distress Cardiovascular: rate controlled, S1/S2 + Respiratory: bilateral decreased breath sounds at bases Abdominal: Soft, NT, ND, bowel sounds + Extremities: no edema, no cyanosis    The results of significant diagnostics from this hospitalization (including imaging, microbiology, ancillary and laboratory) are listed below for reference.     Microbiology: No results found for this or any previous visit (from the past 240 hours).   Labs: BNP (last 3 results) Recent Labs    08/03/24 1018  BNP 99.7   Basic Metabolic Panel: Recent Labs  Lab 11/03/24 2221  NA 139  139  K 3.9  3.7  CL 103  103  CO2 31  GLUCOSE 135*  136*  BUN 24*  25*  CREATININE 0.98  1.20*  CALCIUM  9.5   Liver Function Tests: Recent Labs  Lab 11/03/24 2221  AST 30  ALT 21  ALKPHOS 94  BILITOT 0.3  PROT 6.3*  ALBUMIN 3.9   No results for input(s): LIPASE, AMYLASE in the last 168 hours. No results for input(s): AMMONIA in the last 168 hours. CBC: Recent Labs  Lab 11/03/24 2221  WBC 7.1  NEUTROABS 2.9  HGB 12.4  11.6*  HCT 36.1  34.0*  MCV 88.0  PLT 229   Cardiac Enzymes: No results for input(s): CKTOTAL, CKMB, CKMBINDEX, TROPONINI in the last 168 hours. BNP: Invalid input(s): POCBNP CBG: Recent Labs  Lab 11/03/24 2219 11/03/24 2231 11/04/24 0731 11/04/24 1105 11/04/24 1606  GLUCAP 134* 130* 110* 137* 174*   D-Dimer No results for input(s): DDIMER in the last 72 hours. Hgb A1c Recent Labs    11/03/24 2221  HGBA1C 6.0*   Lipid Profile Recent Labs    11/04/24 0617  CHOL 196  HDL 53  LDLCALC 123*  TRIG 101  CHOLHDL 3.7   Thyroid   function studies No results for input(s): TSH, T4TOTAL, T3FREE, THYROIDAB in the last 72 hours.  Invalid input(s): FREET3 Anemia work up No results for input(s): VITAMINB12, FOLATE, FERRITIN, TIBC, IRON, RETICCTPCT in the last 72 hours. Urinalysis    Component Value Date/Time    COLORURINE YELLOW 10/22/2023 0804   APPEARANCEUR Clear 04/16/2024 1102   LABSPEC 1.015 10/22/2023 0804   PHURINE 5.0 10/22/2023 0804   GLUCOSEU Negative 04/16/2024 1102   HGBUR NEGATIVE 10/22/2023 0804   BILIRUBINUR Negative 04/16/2024 1102   KETONESUR NEGATIVE 10/22/2023 0804   PROTEINUR Negative 04/16/2024 1102   PROTEINUR NEGATIVE 10/22/2023 0804   NITRITE Negative 04/16/2024 1102   NITRITE NEGATIVE 10/22/2023 0804   LEUKOCYTESUR 3+ (A) 04/16/2024 1102   LEUKOCYTESUR LARGE (A) 10/22/2023 0804   Sepsis Labs Recent Labs  Lab 11/03/24 2221  WBC 7.1   Microbiology No results found for this or any previous visit (from the past 240 hours).   Time coordinating discharge: 35 minutes  SIGNED:   Lamisha Roussell, MD  [facility] 11/04/2024, 4:08 PM

## 2024-11-04 NOTE — Progress Notes (Signed)
 Bedside RN, Ms. Annabel, confirmed the patient passed a bedside swallow evaluation.  Heart healthy, carb modified diet ordered.  No charge note.

## 2024-11-04 NOTE — Evaluation (Signed)
 Occupational Therapy Evaluation Patient Details Name: Yvonne [name] MRN: [id number] DOB: 10-18-1951 Today's Date: 11/04/2024   History of Present Illness   Yvonne [name] is a 73 y.o. female with medical history significant for prior CVA on Eliquis , type 2 diabetes, hypertension, coronary artery disease, chronic HFpEF, generalized anxiety disorder, benzodiazepine dependence, who presented to the ER with complaints of left-sided weakness and tingling.  She noted the tingling about a week ago, involving her left fingers and left toes, and felt like she was going to have another stroke.  Yesterday around 9:15 PM she was laying in bed and got up and felt weak on the left side.  Denies falling.  Associated with numbness and tingling in her fingers and toes.  She was brought into the ER for further evaluation. (per DO)     Clinical Impressions Pt agreeable to OT and PT co-evaluation. Pt appears to be at or near baseline function. Pt reports feeling back to her baseline. Pt able to ambulate and complete ADL's without assist today. No unilateral weakness or sensation loss noted. Vision appeared [facility] as well. Pt left in the bed with family present. Pt is not recommended for any further acute OT services and will be discharged to care of nursing staff for remaining length of stay.                Functional Status Assessment   Patient has not had a recent decline in their functional status     Equipment Recommendations   None recommended by OT             Precautions/Restrictions   Precautions Precautions: Fall Recall of Precautions/Restrictions: Intact Restrictions Weight Bearing Restrictions Per Provider Order: No     Mobility Bed Mobility Overal bed mobility: Independent                  Transfers Overall transfer level: Independent                 General transfer comment: Sit to stand followed by ambulation without AD.      Balance Overall balance  assessment: Independent                                         ADL either performed or assessed with clinical judgement   ADL Overall ADL's : Independent                                             Vision Baseline Vision/History: 1 Wears glasses Ability to See in Adequate Light: 0 Adequate Patient Visual Report: No change from baseline Vision Assessment?: Wears glasses for reading;No apparent visual deficits     Perception Perception: Not tested       Praxis Praxis: Not tested       Pertinent Vitals/Pain Pain Assessment Pain Assessment: No/denies pain     Extremity/Trunk Assessment Upper Extremity Assessment Upper Extremity Assessment: Overall WFL for tasks assessed   Lower Extremity Assessment Lower Extremity Assessment: Defer to PT evaluation   Cervical / Trunk Assessment Cervical / Trunk Assessment: Normal   Communication Communication Communication: No apparent difficulties   Cognition Arousal: Alert Behavior During Therapy: WFL for tasks assessed/performed Cognition: No apparent impairments  Following commands: Intact       Cueing  General Comments   Cueing Techniques: Verbal cues                 Home Living Family/patient expects to be discharged to:: Private residence Living Arrangements: Spouse/significant other;Children Available Help at Discharge: Family;Available PRN/intermittently Type of Home: House Home Access: Stairs to enter [employer] of Steps: 7 Entrance Stairs-Rails: Left;Right;Can reach both Home Layout: One level     Bathroom Shower/Tub: Chief Strategy Officer: Standard Bathroom Accessibility: Yes How Accessible: Accessible via wheelchair;Accessible via walker Home Equipment: None          Prior Functioning/Environment Prior Level of Function : Independent/Modified Independent;Driving             Mobility  Comments: Community ambulator without AD. ADLs Comments: Independent.                            Co-evaluation PT/OT/SLP Co-Evaluation/Treatment: Yes Reason for Co-Treatment: To address functional/ADL transfers   OT goals addressed during session: ADL's and self-care      AM-PAC OT 6 Clicks Daily Activity     Outcome Measure Help from another person eating meals?: None Help from another person taking care of personal grooming?: None Help from another person toileting, which includes using toliet, bedpan, or urinal?: None Help from another person bathing (including washing, rinsing, drying)?: None Help from another person to put on and taking off regular upper body clothing?: None Help from another person to put on and taking off regular lower body clothing?: None 6 Click Score: 24   End of Session    Activity Tolerance: Patient tolerated treatment well Patient left: in bed;with call bell/phone within reach  OT Visit Diagnosis: Other symptoms and signs involving the nervous system (R29.898)                Time: 8995-8986 OT Time Calculation (min): 9 min Charges:  OT General Charges $OT Visit: 1 Visit OT Evaluation $OT Eval Low Complexity: 1 Low  Tocara Mennen OT, MOT  Jayson Person 11/04/2024, 10:56 AM

## 2024-11-04 NOTE — Consult Note (Addendum)
 I connected with  Yvonne [name] on 11/04/24 by a video enabled telemedicine application and verified that I am speaking with the correct person using two identifiers.   I discussed the limitations of evaluation and management by telemedicine. The patient expressed understanding and agreed to proceed.  Location of patient: [facility] Patient physician: [facility]  Neurology Consultation Reason for Consult: Stroke like symptoms Referring Physician: Dr. Derryl Duval  CC: Left-sided weakness and slurred speech  History is obtained from: Patient, chart review  HPI: Yvonne [name] is a 73 y.o. female with history of previous stroke, hypertension, hyperlipidemia, diabetes, coronary artery disease, on Eliquis  for unclear reasons who presented with left upper and lower extremity numbness.  Patient states she has had on and off numbness in her left upper and left lower extremity for about a week, left side of her face also  felt droopy.  Of note patient states she has been on Eliquis  as recommended by her PCP but I do not see an indication per chart review.   States her numbness/tingling has resolved since last night.  Denies any headache.  Denies any visual deficit, speech disturbance, similar symptoms in the past.  Denies any other concerns  Last normal: Unclear, week ago No tPA or thrombectomy as outside window and no large vessel occlusion Event happened at home mRS 0   ROS: All other systems reviewed and negative except as noted in the HPI.   Past Medical History:  Diagnosis Date   (HFpEF) heart failure with preserved ejection fraction (HCC)    Anxiety    Aortic atherosclerosis    Arthritis    left hand   Asthma    daily and prn inhalers   CAD (coronary artery disease)    Carotid artery disease    Chronic cough    Chronic otitis media 12/2017   Full dentures    GERD (gastroesophageal reflux disease)    History of stroke 09/2017   weakness right hand,  numbness right side face   Hyperlipidemia    Hypertension    states under control with meds., has been on med. x a long time, per pt.   Non-insulin  dependent type 2 diabetes mellitus (HCC)    Orthostatic hypotension    Overactive bladder    PSVT (paroxysmal supraventricular tachycardia)    Recurrent acute suppurative otitis media without spontaneous rupture of left tympanic membrane 02/19/2018   Stroke (HCC)    Transient cerebral ischemia     Family History  Problem Relation Age of Onset   Breast cancer Mother    Arthritis Mother    Diabetes Mother    CVA Mother    Stroke Mother    Diabetes Father    Heart disease Father        CABG.  Does not know age of onset   Asthma Father    Hepatitis C Sister    Peripheral Artery Disease Daughter    Arthritis Brother    Cancer Brother        metastic cancer   Arthritis-Osteo Son     Social History:  reports that she has never smoked. She has never used smokeless tobacco. She reports that she does not currently use alcohol. She reports that she does not use drugs.  Facility-Administered Medications Prior to Admission  Medication Dose Route Frequency Provider Last Rate Last Admin   promethazine  (PHENERGAN ) injection 25 mg  25 mg Intramuscular Q6H PRN Gladis, Mary-Margaret, FNP   25 mg  at 12/28/22 1316   Medications Prior to Admission  Medication Sig Dispense Refill Last Dose/Taking   Accu-Chek Softclix Lancets lancets Test BS up to 4 times daily Dx E11.69 400 each 3    albuterol  (PROVENTIL ) (2.5 MG/3ML) 0.083% nebulizer solution Take 3 mLs (2.5 mg total) by nebulization 2 (two) times daily. 180 mL 12    ALPRAZolam  (XANAX ) 1 MG tablet ! Each morning, one each evening, and 1/2 each afternoon 75 tablet 5    amLODipine  (NORVASC ) 10 MG tablet TAKE 1 TABLET BY MOUTH EVERY DAY 90 tablet 0    apixaban  (ELIQUIS ) 5 MG TABS tablet Take 1 tablet (5 mg total) by mouth 2 (two) times daily. 180 tablet 3    atorvastatin  (LIPITOR ) 80 MG tablet TAKE 1  TABLET BY MOUTH EVERY DAY 90 tablet 0    Bempedoic Acid-Ezetimibe  (NEXLIZET ) 180-10 MG TABS Take 1 tablet by mouth daily. Take 180 mg by mouth daily. 90 tablet 3    Blood Glucose Monitoring Suppl (ACCU-CHEK GUIDE ME) w/Device KIT TEST BLOOD SUGAR UP TO 4 TIMES DAILY DX E11.69 1 kit 0    budesonide  (PULMICORT ) 0.25 MG/2ML nebulizer solution Take 2 mLs (0.25 mg total) by nebulization 2 (two) times daily. 120 mL 12    clobetasol  cream (TEMOVATE ) 0.05 % Apply 1 Application topically 2 (two) times daily. Apply to affected areas of insect bites twice daily until clear 30 g 0    clopidogrel  (PLAVIX ) 75 MG tablet Take 1 tablet (75 mg total) by mouth daily. 90 tablet 3    conjugated estrogens  (PREMARIN ) vaginal cream Use 0.5 gm in vagina nightly for 1 week then 2 x weekly 12 g 0    cyclobenzaprine  (FLEXERIL ) 10 MG tablet Take 1 tablet (10 mg total) by mouth 3 (three) times daily as needed for muscle spasms. 30 tablet 0    dapagliflozin  propanediol (FARXIGA ) 10 MG TABS tablet Take 1 tablet (10 mg total) by mouth daily. 90 tablet 5    diclofenac  Sodium (VOLTAREN ) 1 % GEL Apply 2 g topically 4 (four) times daily. 350 g 0    famotidine  (PEPCID ) 20 MG tablet TAKE 1 TABLET BY MOUTH EVERYDAY AT BEDTIME 90 tablet 0    furosemide  (LASIX ) 20 MG tablet TAKE 1 TABLET BY MOUTH EVERY DAY 90 tablet 0    glucose blood (ACCU-CHEK GUIDE TEST) test strip Use as instructed 400 strip 3    levofloxacin  (LEVAQUIN ) 500 MG tablet Take 1 tablet (500 mg total) by mouth daily. For 10 days 10 tablet 0    ofloxacin  (FLOXIN ) 0.3 % OTIC solution Place 5 drops into the left ear daily. (Patient not taking: Reported on 10/19/2024) 5 mL 0    oxybutynin  (DITROPAN -XL) 5 MG 24 hr tablet Take 1 tablet (5 mg total) by mouth at bedtime. 90 tablet 3    pantoprazole  (PROTONIX ) 40 MG tablet TAKE 1 TABLET (40 MG TOTAL) BY MOUTH DAILY. FOR STOMACH 90 tablet 0    pregabalin  (LYRICA ) 150 MG capsule TAKE 1 CAPSULE BY MOUTH AT BEDTIME. 90 capsule 1     tirzepatide  (MOUNJARO ) 10 MG/0.5ML Pen Inject 10 mg into the skin once a week. 6 mL 3    triamcinolone  cream (KENALOG ) 0.1 % Apply 1 Application topically 2 (two) times daily as needed (itchy rash). 30 g 0       Exam: Current vital signs: BP (!) 163/71 (BP Location: Right Arm)   Pulse 61   Temp 98.7 F (37.1 C) (Oral)   Resp 18  Ht 5' 2 (1.575 m)   Wt 71.8 kg   SpO2 97%   BMI 28.95 kg/m  Vital signs in last 24 hours: Temp:  [98 F (36.7 C)-98.7 F (37.1 C)] 98.7 F (37.1 C) (12/10 1038) Pulse Rate:  [61-70] 61 (12/10 1038) Resp:  [17-20] 18 (12/10 1038) BP: (142-181)/(67-86) 163/71 (12/10 1038) SpO2:  [92 %-100 %] 97 % (12/10 1038) Weight:  [71 kg-71.8 kg] 71.8 kg (12/10 0208)   Physical Exam  Constitutional: Appears well-developed and well-nourished.  Neuro: AO x 3, cranial nerves grossly intact, antigravity without drift in upper extremities, sensory intact to light touch, FTN intact bilaterally  NIHSS 0  I have reviewed labs in epic and the results pertinent to this consultation are: CBC:  Recent Labs  Lab 11/03/24 2221  WBC 7.1  NEUTROABS 2.9  HGB 12.4  11.6*  HCT 36.1  34.0*  MCV 88.0  PLT 229    Basic Metabolic Panel:  Lab Results  Component Value Date   NA 139 11/03/2024   NA 139 11/03/2024   K 3.7 11/03/2024   K 3.9 11/03/2024   CO2 31 11/03/2024   GLUCOSE 136 (H) 11/03/2024   GLUCOSE 135 (H) 11/03/2024   BUN 25 (H) 11/03/2024   BUN 24 (H) 11/03/2024   CREATININE 1.20 (H) 11/03/2024   CREATININE 0.98 11/03/2024   CALCIUM  9.5 11/03/2024   GFRNONAA >60 11/03/2024   GFRAA 65 11/02/2020   Lipid Panel:  Lab Results  Component Value Date   LDLCALC 123 (H) 11/04/2024   HgbA1c:  Lab Results  Component Value Date   HGBA1C 6.0 (H) 11/03/2024   Urine Drug Screen:     Component Value Date/Time   LABOPIA NEGATIVE 11/03/2024 2335   COCAINSCRNUR NEGATIVE 11/03/2024 2335   LABBENZ NEGATIVE 11/03/2024 2335   AMPHETMU NEGATIVE 11/03/2024  2335   THCU NEGATIVE 11/03/2024 2335   LABBARB NEGATIVE 11/03/2024 2335    Alcohol Level     Component Value Date/Time   ETH <15 11/03/2024 2221     I have reviewed the images obtained:  CT head without contrast 11/03/2024: No acute abnormality  CT head and neck with and without contrast 11/03/2024: No large vessel occlusion.  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 Chronic infarcts in the left thalamus and corona radiata. Mild periventricular white matter disease.   ASSESSMENT/PLAN: 42 old female with history of stroke, on Eliquis  for unclear reasons presented with left-sided numbness that was ongoing for about a week but resolved yesterday night.  Transient left-sided paresthesia - Etiology unclear.  It would be atypical for TIA to last this long.  MRI brain does not show any stroke.  Differentials include MRI negative stroke versus cervical stenosis  Recommendations: - Recommend MRI C-spine without contrast as an outpatient - Unclear why patient is on Eliquis .  I did send a message to patient's PCP to clarify the indication - If patient needs to continue Eliquis , then does not need any other medication changes from neurology standpoint - However if patient does not have an indication for Eliquis  and if this is discontinued, I would recommend patient take aspirin  81 mg daily and Plavix  75 mg daily for 3 months followed by aspirin  81 mg daily - Continue atorvastatin  80 mg daily -TTE ordered and pending - Modification of stroke risk factors - Stroke education - Follow-up with neurology in 2 to 3 months (order placed) - Discussed plan with hospitalist via  secure chat  Addendum - Per Dr. Zollie, patient was started on Eliquis  due to multiple TIAs and lacunar infarctions in spite of taking plavix  and aspirin .  He has had cardiac monitor for 14 days in the past which did not show atrial fibrillation. -  According to guidelines, recommendation for cryptogenic stroke ( ESUS) is remains antiplatelet therapy plus aggressive vascular risk control rather than empiric anticoagulation unless a cardioembolic source for example atrial fibrillation is identified.  Additionally it does increase the risk of hemorrhage - Therefore for now I would suggest patient's stop Eliquis  and start  aspirin  81 mg daily and Plavix  75 mg daily for 3 months followed by aspirin  81 mg daily  - Additionally recommend referral to cardiology for implantable loop recorder to look for paroxysmal A-fib if TTE is negative for thrombus   Thank you for allowing us  to participate in the care of this patient. If you have any further questions, please contact  me or neurohospitalist.     I personally spent a total of 85 minutes in the care of the patient today including getting/reviewing separately obtained history, performing a medically appropriate exam/evaluation, counseling and educating, placing orders, referring and communicating with other health care professionals, documenting clinical information in the EHR, independently interpreting results, and coordinating care.         Arlin Krebs [facility]

## 2024-11-12 ENCOUNTER — Ambulatory Visit (INDEPENDENT_AMBULATORY_CARE_PROVIDER_SITE_OTHER): Admitting: Family Medicine

## 2024-11-12 ENCOUNTER — Encounter: Payer: Self-pay | Admitting: Family Medicine

## 2024-11-12 VITALS — BP 129/74 | HR 73 | Temp 97.9°F | Ht 62.0 in | Wt 160.0 lb

## 2024-11-12 DIAGNOSIS — F1320 Sedative, hypnotic or anxiolytic dependence, uncomplicated: Secondary | ICD-10-CM

## 2024-11-12 DIAGNOSIS — F411 Generalized anxiety disorder: Secondary | ICD-10-CM

## 2024-11-12 DIAGNOSIS — Z79899 Other long term (current) drug therapy: Secondary | ICD-10-CM

## 2024-11-12 DIAGNOSIS — G459 Transient cerebral ischemic attack, unspecified: Secondary | ICD-10-CM

## 2024-11-12 MED ORDER — CLOPIDOGREL BISULFATE 75 MG PO TABS: 75.0000 mg | ORAL_TABLET | Freq: Every day | ORAL | 3 refills | Status: AC

## 2024-11-12 NOTE — Progress Notes (Signed)
 Subjective:  Patient ID: Yvonne [name], female    DOB: 02-May-1951  Age: 73 y.o. MRN: [id number]  CC: Hospitalization Follow-up (Pt reports she was in hospital for CVA. Feeling fine but still tired. ) and Medication Problem (Hopital told her to stop the eliquis  and take aspirin  and plavix  and then eventually just take aspirin . )   HPI  Discussed the use of AI scribe software for clinical note transcription with the patient, who gave verbal consent to proceed.  History of Present Illness Yvonne [name] is a 73 year old female with lacunar infarcts and ulcerative colitis who presents with left-sided numbness and facial drooping.  She experienced numbness on the left side of her body, accompanied by facial drooping on the left side. These symptoms resolved within a couple of days. Initially, she was unable to walk due to the numbness, but she regained her ability to walk by the night of the hospital visit.  During her hospital visit, a CT scan, MRI, and echocardiogram were performed. She was previously on Eliquis  but was switched to Plavix  and aspirin  for stroke prevention. She has a history of lacunar infarcts and ulcerative colitis.  Her blood pressure was significantly elevated during the episode, but it has since stabilized. She has been managing her weight and has lost significant weight over the past year, moving from a size 20 to a size 8-10. Despite this, she is categorized as overweight due to her BMI.  She keeps her medications, including Xanax  and pregabalin , locked in a gun safe due to concerns about theft, particularly from her daughter-in-law who is on gabapentin .          10/19/2024    8:03 AM 08/25/2024   11:23 AM 06/30/2024   10:18 AM  Depression screen PHQ 2/9  Decreased Interest 1 2 2   Down, Depressed, Hopeless 2 3 3   PHQ - 2 Score 3 5 5   Altered sleeping 2 3 3   Tired, decreased energy 2 2 1   Change in appetite 3 3 3   Feeling bad or failure about yourself  3 3 3    Trouble concentrating 2 1 1   Moving slowly or fidgety/restless 3 2 2   Suicidal thoughts 0 0 0  PHQ-9 Score 18 19  18    Difficult doing work/chores Somewhat difficult Somewhat difficult Somewhat difficult     Data saved with a previous flowsheet row definition    History Yvonne [name] has a past medical history of (HFpEF) heart failure with preserved ejection fraction (HCC), Anxiety, Aortic atherosclerosis, Arthritis, Asthma, CAD (coronary artery disease), Carotid artery disease, Chronic cough, Chronic otitis media (12/2017), Full dentures, GERD (gastroesophageal reflux disease), History of stroke (09/2017), Hyperlipidemia, Hypertension, Non-insulin  dependent type 2 diabetes mellitus (HCC), Orthostatic hypotension, Overactive bladder, PSVT (paroxysmal supraventricular tachycardia), Recurrent acute suppurative otitis media without spontaneous rupture of left tympanic membrane (02/19/2018), Stroke (HCC), and Transient cerebral ischemia.   She has a past surgical history that includes Cholecystectomy; Colonoscopy (N/A, 08/17/2015); Abdominal hysterectomy; Lacrimal duct exploration (Bilateral); Cataract extraction w/ intraocular lens implant (Right); Vitrectomy (Left, 09/14/2015); Gas insertion (Right); Myringotomy with tube placement (Bilateral, 01/28/2018); IR ANGIO INTRA EXTRACRAN SEL INTERNAL CAROTID BILAT MOD SED (10/22/2023); and IR ANGIO VERTEBRAL SEL VERTEBRAL UNI L MOD SED (10/22/2023).   Her family history includes Arthritis in her brother and mother; Arthritis-Osteo in her son; Asthma in her father; Breast cancer in her mother; CVA in her mother; Cancer in her brother; Diabetes in her father and mother; Heart disease in her father; Hepatitis  C in her sister; Peripheral Artery Disease in her daughter; Stroke in her mother.She reports that she has never smoked. She has never used smokeless tobacco. She reports that she does not currently use alcohol. She reports that she does not use  drugs.    ROS Review of Systems  Constitutional: Negative.   HENT:  Negative for congestion.   Eyes:  Negative for visual disturbance.  Respiratory:  Negative for shortness of breath.   Cardiovascular:  Negative for chest pain.  Gastrointestinal:  Negative for abdominal pain, constipation, diarrhea, nausea and vomiting.  Genitourinary:  Negative for difficulty urinating.  Musculoskeletal:  Negative for arthralgias and myalgias.  Neurological:  Positive for weakness (left side & face. Couldn't walk. Resolved in E.D.). Negative for headaches.  Psychiatric/Behavioral:  Negative for sleep disturbance.     Objective:  BP 129/74   Pulse 73   Temp 97.9 F (36.6 C)   Ht 5' 2 (1.575 m)   Wt 160 lb (72.6 kg)   SpO2 100%   BMI 29.26 kg/m   BP Readings from Last 3 Encounters:  11/12/24 129/74  11/04/24 (!) 149/73  10/19/24 138/73    Wt Readings from Last 3 Encounters:  11/12/24 160 lb (72.6 kg)  11/04/24 158 lb 4.6 oz (71.8 kg)  10/03/24 158 lb (71.7 kg)     Physical Exam Musculoskeletal:     Left lower leg: Left lower leg edema: tran.  Neurological:     General: No focal deficit present.     Mental Status: She is oriented to person, place, and time.     Cranial Nerves: No cranial nerve deficit.     Sensory: No sensory deficit.     Motor: No weakness.     Coordination: Coordination normal.     Gait: Gait normal.     Deep Tendon Reflexes: Reflexes normal.  Psychiatric:        Mood and Affect: Mood normal.        Behavior: Behavior normal.    Physical Exam VITALS: BP- 129/74 MEASUREMENTS: Weight- 160. GENERAL: Alert, cooperative, well developed, no acute distress HEENT: Normocephalic, normal oropharynx, moist mucous membranes CHEST: Clear to auscultation bilaterally, No wheezes, rhonchi, or crackles CARDIOVASCULAR: Normal heart rate and rhythm, S1 and S2 normal without murmurs ABDOMEN: Soft, non-tender, non-distended, without organomegaly, Normal bowel  sounds EXTREMITIES: No cyanosis or edema NEUROLOGICAL: Cranial nerves grossly intact, Moves all extremities without gross motor or sensory deficit   Assessment & Plan:  TIA (transient ischemic attack)  GAD (generalized anxiety disorder) -     ALPRAZolam ; ! Each morning, one each evening, and 1/2 each afternoon  Dispense: 225 tablet; Refill: 1  Benzodiazepine dependence, continuous (HCC) -     ALPRAZolam ; ! Each morning, one each evening, and 1/2 each afternoon  Dispense: 225 tablet; Refill: 1  Controlled substance agreement signed -     ALPRAZolam ; ! Each morning, one each evening, and 1/2 each afternoon  Dispense: 225 tablet; Refill: 1    Assessment and Plan Assessment & Plan Transient ischemic attack   She experiences recurrent episodes of left-sided facial numbness and drooping, resolving within days. Recent imaging showed no new lacunar infarcts. Previous treatment with Eliquis  was deemed inappropriate due to recurrent symptoms. The current plan involves switching to Plavix  and aspirin  for stroke prevention, as it is considered safer and cost-effective. There are no long-term consequences from previous episodes. Eliquis  was discontinued. Plavix  and aspirin  were initiated for stroke prevention, with Plavix  prescribed for a three-month supply  and aspirin  to be continued over the counter.  Heart failure with preserved ejection fraction   She has grade 1 heart failure with preserved ejection fraction due to a thickened heart wall from long-standing hypertension. A recent echocardiogram showed no reduced ejection fraction. She will continue follow-up with cardiologist Dr. Lawerance for heart evaluation.  Hypertension   Her blood pressure was elevated during a recent episode but is currently well-controlled at 129/74 mmHg. The target blood pressure is 130/80 mmHg or below when relaxed and rested. She will continue to monitor blood pressure regularly and maintain a low-salt  diet.  Benzodiazepine dependence   She continues to use Xanax  for symptom management. The high risk of abuse and the need for secure storage due to the environment were discussed. Xanax  was prescribed with a three-month supply, and she was advised on secure storage of the medication.  Overweight   Her BMI is over 25, categorized as overweight. She has achieved significant weight loss from size 20 to size 8-10 over the past year or two. Further weight loss is recommended for optimal health. She is encouraged to continue weight loss efforts, with a target weight of 156 pounds discussed for optimal health.       Follow-up: No follow-ups on file.  Butler Der, M.D.

## 2024-11-17 ENCOUNTER — Ambulatory Visit: Payer: Self-pay | Admitting: Family Medicine

## 2024-11-21 ENCOUNTER — Other Ambulatory Visit: Payer: Self-pay | Admitting: Family Medicine

## 2024-11-22 ENCOUNTER — Other Ambulatory Visit: Payer: Self-pay | Admitting: Family Medicine

## 2024-11-22 DIAGNOSIS — G459 Transient cerebral ischemic attack, unspecified: Secondary | ICD-10-CM

## 2024-11-22 DIAGNOSIS — E782 Mixed hyperlipidemia: Secondary | ICD-10-CM

## 2024-11-23 ENCOUNTER — Encounter (HOSPITAL_BASED_OUTPATIENT_CLINIC_OR_DEPARTMENT_OTHER): Payer: Self-pay

## 2024-11-24 ENCOUNTER — Ambulatory Visit: Admitting: Family Medicine

## 2024-11-24 ENCOUNTER — Encounter: Payer: Self-pay | Admitting: Internal Medicine

## 2024-11-24 ENCOUNTER — Ambulatory Visit: Attending: Internal Medicine | Admitting: Internal Medicine

## 2024-11-24 VITALS — BP 132/79 | HR 58 | Ht 62.0 in | Wt 155.0 lb

## 2024-11-24 DIAGNOSIS — I10 Essential (primary) hypertension: Secondary | ICD-10-CM | POA: Diagnosis not present

## 2024-11-24 DIAGNOSIS — R079 Chest pain, unspecified: Secondary | ICD-10-CM | POA: Diagnosis not present

## 2024-11-24 DIAGNOSIS — I5032 Chronic diastolic (congestive) heart failure: Secondary | ICD-10-CM

## 2024-11-24 DIAGNOSIS — I2510 Atherosclerotic heart disease of native coronary artery without angina pectoris: Secondary | ICD-10-CM

## 2024-11-24 DIAGNOSIS — I700 Atherosclerosis of aorta: Secondary | ICD-10-CM | POA: Diagnosis not present

## 2024-11-24 DIAGNOSIS — I951 Orthostatic hypotension: Secondary | ICD-10-CM

## 2024-11-24 DIAGNOSIS — E1169 Type 2 diabetes mellitus with other specified complication: Secondary | ICD-10-CM

## 2024-11-24 DIAGNOSIS — E756 Lipid storage disorder, unspecified: Secondary | ICD-10-CM | POA: Diagnosis not present

## 2024-11-24 MED ORDER — NEXLIZET 180-10 MG PO TABS: 1.0000 | ORAL_TABLET | Freq: Every day | ORAL | 3 refills | Status: AC

## 2024-11-24 NOTE — Progress Notes (Signed)
 " Cardiology Office Note:    Date:  11/24/2024   ID:  Yvonne [name], DOB 1951-08-01, MRN [id number]  PCP:  Zollie Lowers, MD    [facility]  Cardiologist:  None  Advanced Practice Provider:  No care team member to display Electrophysiologist:  None      CC: F/u TIA  History of Present Illness:    Yvonne [name] is a 73 y.o. female with a hx of HTN with DM orthostatic hypotension, HFpE,, Chronic Migraine, GERD, HLD with DM and Aortic atherosclerosis, stoke with prior lacunar infarcts 01/03/21, CKD IIIa seen 2022 for CP found to have moderate non obstructive CAD.  Lost to follow up.   2022: HFpEF 2023: CP f/u 2025: Chest Pain evaluated at [facility]  Social :  Daughter had prior strokes, Had three grandkids: 2 boys, one girl; eldest is now in college  Yvonne [name] is a 73 year old female with a history of multiple strokes who presents for follow-up after a recent stroke.  She has a history of multiple strokes, with the most recent occurring in 2025, characterized by tingling and numbness on her left side. She also experienced chest pain described as pressure and tightness, leading to her hospitalization three weeks ago. Notably, she does not experience chest pain during physical activity such as yard work.  Her medical history includes hypertension and diabetes, both managed with medication. There have been no recent changes in her blood pressure control. She is currently on Lasix  20 mg for fluid management and reports no issues with fluid retention. No significant weight gain, fluid retention, or swelling in her legs or abdomen.  No shortness of breath or significant weight gain. She maintains an active lifestyle, caring for her grandchildren and volunteering.  Her history of hyperlipidemia was previously managed with Nexlizet . Recently, her cholesterol was found to be high, and she was taken off Nexlizet  by another doctor without a clear reason. She has had an allergic  reaction to Repatha  in the past.  No recent episodes of atrial fibrillation, and previous heart monitors in 2022 and 2023 did not detect any atrial fibrillation. She has not had any recent heart monitors or loop recorders placed.  Discussed the use of AI scribe software for clinical note transcription with the patient, who gave verbal consent to proceed.   Past Medical History:  Diagnosis Date   (HFpEF) heart failure with preserved ejection fraction (HCC)    Anxiety    Aortic atherosclerosis    Arthritis    left hand   Asthma    daily and prn inhalers   CAD (coronary artery disease)    Carotid artery disease    Chronic cough    Chronic otitis media 12/2017   Full dentures    GERD (gastroesophageal reflux disease)    History of stroke 09/2017   weakness right hand, numbness right side face   Hyperlipidemia    Hypertension    states under control with meds., has been on med. x a long time, per pt.   Non-insulin  dependent type 2 diabetes mellitus (HCC)    Orthostatic hypotension    Overactive bladder    PSVT (paroxysmal supraventricular tachycardia)    Recurrent acute suppurative otitis media without spontaneous rupture of left tympanic membrane 02/19/2018   Stroke (HCC)    Transient cerebral ischemia     Past Surgical History:  Procedure Laterality Date   ABDOMINAL HYSTERECTOMY     complete   CATARACT EXTRACTION W/  INTRAOCULAR LENS IMPLANT Right    CHOLECYSTECTOMY     COLONOSCOPY N/A 08/17/2015   Procedure: COLONOSCOPY;  Surgeon: Claudis RAYMOND Rivet, MD;  [facility];  Service: Endoscopy;  Laterality: N/A;  200 - moved to 7:30 - Ann notified pt   GAS INSERTION Right    x 2 - eye   IR ANGIO INTRA EXTRACRAN SEL INTERNAL CAROTID BILAT MOD SED  10/22/2023   IR ANGIO VERTEBRAL SEL VERTEBRAL UNI L MOD SED  10/22/2023   LACRIMAL DUCT EXPLORATION Bilateral    removal of tear ducts   MYRINGOTOMY WITH TUBE PLACEMENT Bilateral 01/28/2018   Procedure: BILATERAL MYRINGOTOMY  WITH TUBE PLACEMENT;  Surgeon: Karis Clunes, MD;  Location: [facility];  Service: ENT;  Laterality: Bilateral;   VITRECTOMY Left 09/14/2015    Current Medications: Current Meds  Medication Sig   Accu-Chek Softclix Lancets lancets Test BS up to 4 times daily Dx E11.69   albuterol  (PROVENTIL ) (2.5 MG/3ML) 0.083% nebulizer solution Take 3 mLs (2.5 mg total) by nebulization 2 (two) times daily.   ALPRAZolam  (XANAX ) 1 MG tablet ! Each morning, one each evening, and 1/2 each afternoon   amLODipine  (NORVASC ) 10 MG tablet TAKE 1 TABLET BY MOUTH EVERY DAY   aspirin  EC 81 MG tablet Take 1 tablet (81 mg total) by mouth daily. Swallow whole.   atorvastatin  (LIPITOR ) 80 MG tablet TAKE 1 TABLET BY MOUTH EVERY DAY   Bempedoic Acid -Ezetimibe  (NEXLIZET ) 180-10 MG TABS Take 1 tablet by mouth daily. Take 180 mg by mouth daily.   Blood Glucose Monitoring Suppl (ACCU-CHEK GUIDE ME) w/Device KIT TEST BLOOD SUGAR UP TO 4 TIMES DAILY DX E11.69   budesonide  (PULMICORT ) 0.25 MG/2ML nebulizer solution Take 2 mLs (0.25 mg total) by nebulization 2 (two) times daily.   clopidogrel  (PLAVIX ) 75 MG tablet Take 1 tablet (75 mg total) by mouth daily.   cyclobenzaprine  (FLEXERIL ) 10 MG tablet Take 1 tablet (10 mg total) by mouth 3 (three) times daily as needed for muscle spasms.   dapagliflozin  propanediol (FARXIGA ) 10 MG TABS tablet Take 1 tablet (10 mg total) by mouth daily.   diclofenac  Sodium (VOLTAREN ) 1 % GEL Apply 2 g topically 4 (four) times daily.   famotidine  (PEPCID ) 20 MG tablet TAKE 1 TABLET BY MOUTH EVERYDAY AT BEDTIME   furosemide  (LASIX ) 20 MG tablet TAKE 1 TABLET BY MOUTH EVERY DAY   glucose blood (ACCU-CHEK GUIDE TEST) test strip Use as instructed   oxybutynin  (DITROPAN -XL) 5 MG 24 hr tablet Take 1 tablet (5 mg total) by mouth at bedtime.   pantoprazole  (PROTONIX ) 40 MG tablet TAKE 1 TABLET (40 MG TOTAL) BY MOUTH DAILY. FOR STOMACH   pregabalin  (LYRICA ) 150 MG capsule TAKE 1 CAPSULE BY MOUTH AT  BEDTIME.   tirzepatide  (MOUNJARO ) 10 MG/0.5ML Pen Inject 10 mg into the skin once a week.   Current Facility-Administered Medications for the 11/24/24 encounter (Office Visit) with Santo Stanly LABOR, MD  Medication   promethazine  (PHENERGAN ) injection 25 mg     Allergies:   Penicillins, Metformin  and related, Gabapentin , Lisinopril, Repatha  [evolocumab ], and Soap   Social History   Socioeconomic History   Marital status: Married    Spouse name: Not on file   Number of children: 3   Years of education: Not on file   Highest education level: GED or equivalent  Occupational History   Occupation: retired    Comment: Producer, television/film/video and restaurants  Tobacco Use   Smoking status: Never  Smokeless tobacco: Never  Vaping Use   Vaping status: Never Used  Substance and Sexual Activity   Alcohol use: Not Currently   Drug use: No   Sexual activity: Yes    Birth control/protection: Surgical    Comment: hyst  Other Topics Concern   Not on file  Social History Narrative   Lives at home home with husband with    Son, daughter in law and 3 children.  Darden, his girlfriend and her child also moved in 10/2018      Disabled.   Social Drivers of Health   Tobacco Use: Low Risk (11/12/2024)   Patient History    Smoking Tobacco Use: Never    Smokeless Tobacco Use: Never    Passive Exposure: Not on file  Financial Resource Strain: Low Risk (06/26/2024)   Received from [facility]   Overall Financial Resource Strain (CARDIA)    How hard is it for you to pay for the very basics like food, housing, medical care, and heating?: Not hard at all  Recent Concern: Financial Resource Strain - Medium Risk (05/23/2024)   Overall Financial Resource Strain (CARDIA)    Difficulty of Paying Living Expenses: Somewhat hard  Food Insecurity: No Food Insecurity (11/04/2024)   Epic    Worried About Radiation Protection Practitioner of Food in the Last Year: Never true    Ran Out of Food in the Last Year: Never  true  Transportation Needs: No Transportation Needs (11/04/2024)   Epic    Lack of Transportation (Medical): No    Lack of Transportation (Non-Medical): No  Physical Activity: Sufficiently Active (06/26/2024)   Received from [facility]   Exercise Vital Sign    On average, how many days per week do you engage in moderate to strenuous exercise (like a brisk walk)?: 7 days    On average, how many minutes do you engage in exercise at this level?: 60 min  Recent Concern: Physical Activity - Insufficiently Active (05/23/2024)   Exercise Vital Sign    Days of Exercise per Week: 4 days    Minutes of Exercise per Session: 20 min  Stress: Stress Concern Present (06/26/2024)   Received from [facility] - Occupational Stress Questionnaire    Do you feel stress - tense, restless, nervous, or anxious, or unable to sleep at night because your mind is troubled all the time - these days?: Very much  Social Connections: Moderately Integrated (11/04/2024)   Social Connection and Isolation Panel    Frequency of Communication with Friends and Family: More than three times a week    Frequency of Social Gatherings with Friends and Family: More than three times a week    Attends Religious Services: More than 4 times per year    Active Member of Clubs or Organizations: No    Attends Banker Meetings: Never    Marital Status: Married  Depression (PHQ2-9): High Risk (10/19/2024)   Depression (PHQ2-9)    PHQ-2 Score: 18  Alcohol Screen: Low Risk (03/05/2024)   Alcohol Screen    Last Alcohol Screening Score (AUDIT): 0  Housing: Low Risk (11/04/2024)   Epic    Unable to Pay for Housing in the Last Year: No    Number of Times Moved in the Last Year: 0    Homeless in the Last Year: No  Utilities: Not At Risk (11/04/2024)   Epic    Threatened with loss of utilities: No  Health Literacy: Low Risk (06/26/2024)   Received from [facility]  Literacy    How often do you need to have someone help you when you read instructions, pamphlets, or other written material from your doctor or pharmacy?: Never      Family History: The patient's family history includes Arthritis in her brother and mother; Arthritis-Osteo in her son; Asthma in her father; Breast cancer in her mother; CVA in her mother; Cancer in her brother; Diabetes in her father and mother; Heart disease in her father; Hepatitis C in her sister; Peripheral Artery Disease in her daughter; Stroke in her mother.  ROS:   Please see the history of present illness.     EKGs/Labs/Other Studies Reviewed:    Recent Labs: 08/03/2024: BNP 99.7; TSH 1.930 11/03/2024: ALT 21; BUN 25; BUN 24; Creatinine, Ser 1.20; Creatinine, Ser 0.98; Hemoglobin 11.6; Hemoglobin 12.4; Platelets 229; Potassium 3.7; Potassium 3.9; Sodium 139; Sodium 139  Recent Lipid Panel    Component Value Date/Time   CHOL 196 11/04/2024 0617   CHOL 136 08/25/2024 1054   TRIG 101 11/04/2024 0617   HDL 53 11/04/2024 0617   HDL 70 08/25/2024 1054   CHOLHDL 3.7 11/04/2024 0617   VLDL 20 11/04/2024 0617   LDLCALC 123 (H) 11/04/2024 0617   LDLCALC 54 08/25/2024 1054   Non-Cardiac CT: Date: 05/25/2016 Personally Reviewed Results: Aortic Atherosclerosis without coronary calcifications  Transthoracic Echocardiogram: Date: 06/11/2019 Results: 1. The left ventricle has normal systolic function with an ejection  fraction of 60-65%. The cavity size was normal. There is moderately  increased left ventricular wall thickness. Left ventricular diastolic  Doppler parameters are consistent with impaired   relaxation. Elevated mean left atrial pressure.   2. The right ventricle has normal systolic function. The cavity was  normal. There is no increase in right ventricular wall thickness.   3. Left atrial size was mildly dilated.   4. No evidence of mitral valve stenosis.   5. The aortic valve has an indeterminate number of  cusps. No stenosis of  the aortic valve.   6. The aortic root is normal in size and structure.   7. Pulmonary hypertension is indeterminate, inadequate TR jet.   NM Stress Testing : Date: 08/13/2016 Results: Report only from [facility]:  Normal function with perfusion defect  Cardiac Studies & Procedures   ______________________________________________________________________________________________   STRESS TESTS  MYOCARDIAL PERFUSION IMAGING 08/13/2016   ECHOCARDIOGRAM  ECHOCARDIOGRAM COMPLETE 11/04/2024  Narrative ECHOCARDIOGRAM REPORT    Patient Name:   Yvonne [name] Date of Exam: 11/04/2024 Medical Rec #:  [id number]     Height:       62.0 in Accession #:    [id number]    Weight:       158.3 lb Date of Birth:  01/09/1951      BSA:          1.731 m Patient Age:    73 years      BP:           151/67 mmHg Patient Gender: F             HR:           62 bpm. Exam Location:  [facility]  Procedure: 2D Echo, Cardiac Doppler and Color Doppler (Both Spectral and Color Flow Doppler were utilized during procedure).  Indications:    Stroke  History:        Patient has prior history of Echocardiogram examinations, most  recent 01/17/2022. CHF, CAD, Stroke and TIA, Signs/Symptoms:Syncope; Risk Factors:Hypertension, Dyslipidemia and Diabetes.  Sonographer:    Juliene Rucks Referring Phys: [id number] TERRY LOISE HURST   Sonographer Comments: Patient is obese. IMPRESSIONS   1. Left ventricular ejection fraction, by estimation, is 60 to 65%. The left ventricle has normal function. The left ventricle has no regional wall motion abnormalities. There is mild left ventricular hypertrophy. Left ventricular diastolic parameters are consistent with Grade I diastolic dysfunction (impaired relaxation). 2. Right ventricular systolic function is normal. The right ventricular size is normal. Tricuspid regurgitation signal is inadequate for assessing PA pressure. 3. The mitral valve is normal in structure.  No evidence of mitral valve regurgitation. No evidence of mitral stenosis. 4. The aortic valve is tricuspid. Aortic valve regurgitation is not visualized. No aortic stenosis is present. 5. The inferior vena cava is normal in size with greater than 50% respiratory variability, suggesting right atrial pressure of 3 mmHg.  FINDINGS Left Ventricle: Left ventricular ejection fraction, by estimation, is 60 to 65%. The left ventricle has normal function. The left ventricle has no regional wall motion abnormalities. The left ventricular internal cavity size was normal in size. There is mild left ventricular hypertrophy. Left ventricular diastolic parameters are consistent with Grade I diastolic dysfunction (impaired relaxation). Normal left ventricular filling pressure.  Right Ventricle: The right ventricular size is normal. Right vetricular wall thickness was not well visualized. Right ventricular systolic function is normal. Tricuspid regurgitation signal is inadequate for assessing PA pressure.  Left Atrium: Left atrial size was normal in size.  Right Atrium: Right atrial size was normal in size.  Pericardium: There is no evidence of pericardial effusion.  Mitral Valve: The mitral valve is normal in structure. No evidence of mitral valve regurgitation. No evidence of mitral valve stenosis.  Tricuspid Valve: The tricuspid valve is normal in structure. Tricuspid valve regurgitation is not demonstrated. No evidence of tricuspid stenosis.  Aortic Valve: The aortic valve is tricuspid. Aortic valve regurgitation is not visualized. No aortic stenosis is present. Aortic valve mean gradient measures 3.0 mmHg. Aortic valve peak gradient measures 5.2 mmHg. Aortic valve area, by VTI measures 3.80 cm.  Pulmonic Valve: The pulmonic valve was not well visualized. Pulmonic valve regurgitation is not visualized. No evidence of pulmonic stenosis.  Aorta: The aortic root and ascending aorta are structurally  normal, with no evidence of dilitation.  Venous: The inferior vena cava is normal in size with greater than 50% respiratory variability, suggesting right atrial pressure of 3 mmHg.  IAS/Shunts: No atrial level shunt detected by color flow Doppler.   LEFT VENTRICLE PLAX 2D LVIDd:         3.60 cm     Diastology LVIDs:         2.70 cm     LV e' medial:    4.70 cm/s LV PW:         1.20 cm     LV E/e' medial:  14.9 LV IVS:        1.20 cm     LV e' lateral:   6.57 cm/s LVOT diam:     2.00 cm     LV E/e' lateral: 10.7 LV SV:         79 LV SV Index:   46 LVOT Area:     3.14 cm  LV Volumes (MOD) LV vol d, MOD A2C: 99.2 ml LV vol d, MOD A4C: 94.8 ml LV vol s, MOD A2C: 47.3 ml LV vol s, MOD  A4C: 43.0 ml LV SV MOD A2C:     51.9 ml LV SV MOD A4C:     94.8 ml LV SV MOD BP:      51.9 ml  RIGHT VENTRICLE RV Basal diam:  2.90 cm RV Mid diam:    2.50 cm RV S prime:     9.58 cm/s TAPSE (M-mode): 1.9 cm  LEFT ATRIUM             Index        RIGHT ATRIUM           Index LA diam:        3.00 cm 1.73 cm/m   RA Area:     10.30 cm LA Vol (A2C):   41.3 ml 23.86 ml/m  RA Volume:   19.20 ml  11.09 ml/m LA Vol (A4C):   36.1 ml 20.86 ml/m LA Biplane Vol: 40.2 ml 23.23 ml/m AORTIC VALVE AV Area (Vmax):    3.03 cm AV Area (Vmean):   2.84 cm AV Area (VTI):     3.80 cm AV Vmax:           113.90 cm/s AV Vmean:          81.120 cm/s AV VTI:            0.209 m AV Peak Grad:      5.2 mmHg AV Mean Grad:      3.0 mmHg LVOT Vmax:         110.00 cm/s LVOT Vmean:        73.400 cm/s LVOT VTI:          0.252 m LVOT/AV VTI ratio: 1.21  AORTA Ao Root diam: 2.90 cm Ao Asc diam:  3.40 cm  MITRAL VALVE MV Area (PHT): 2.45 cm     SHUNTS MV Decel Time: 310 msec     Systemic VTI:  0.25 m MV E velocity: 70.20 cm/s   Systemic Diam: 2.00 cm MV A velocity: 107.00 cm/s MV E/A ratio:  0.66  Dorn Ross MD Electronically signed by Dorn Ross MD Signature Date/Time: 11/04/2024/2:43:45  PM    Final    MONITORS  LONG TERM MONITOR (3-14 DAYS) 02/14/2022  Narrative  Patient had a minimum heart rate of 57 bpm, maximum heart rate of 162 bpm, and average heart rate of 79 bpm.  Predominant underlying rhythm was sinus rhythm.  Five runs of P-SVT longest lasting 10 seconds, fastest rate of 162 bpm.  Isolated PACs were rare (<1.0%).  Isolated PVCs were rare (<1.0%).  Triggered events were sinus rhythm and sinus tachycardia  Asymptomatic P-SVT.   CT SCANS  CT CORONARY FRACTIONAL FLOW RESERVE DATA PREP 02/09/2021  Narrative EXAM: CT-FFR ANALYSIS  CLINICAL DATA:  Coronary Artery Calcifications  FINDINGS: CT-FFR analysis was performed on the original cardiac CT angiogram dataset. Diagrammatic representation of the CT-FFR analysis is provided in a separate PDF document in PACS. This dictation was created using the PDF document and an interactive 3D model of the results. 3D model is not available in the EMR/PACS. Normal FFR range is >0.80.  1. Left Main: No significant functional stenosis, CT-FFR 0.99.  2. LAD: No significant functional stenosis, CT-FFR 0.87 distal LAD, except for distal tip CT-FFR 0.79. 3. LCX: No significant functional stenosis, CT-FFR 0.98 distal vessel. 4. RCA: No significant functional stenosis, CT-FFR 0.91 distal vessel.  IMPRESSION: 1. CT FFR analysis shows evidence of significant functional stenosis only at the distal tip of the LAD.  Stanly Leavens MD  Electronically Signed By: Stanly Leavens MD On: 02/12/2021 10:39   CT CORONARY MORPH W/CTA COR W/SCORE 02/09/2021  Addendum 02/09/2021  3:46 PM ADDENDUM REPORT: 02/09/2021 15:44  CLINICAL DATA:  73 Year-old White Female  EXAM: Cardiac/Coronary  CTA  TECHNIQUE: The patient was scanned on a [employer].  FINDINGS: A 100 kV prospective scan was triggered in the descending thoracic aorta at 111 HU's. Axial non-contrast 3 mm slices were carried  out through the heart. The data set was analyzed on a dedicated work station and scored using the Agatson method. Gantry rotation speed was 250 msecs and collimation was .6 mm. No beta blockade and 0.8 mg of sl NTG was given. The 3D data set was reconstructed in 5% intervals of the 67-82 % of the R-R cycle. Diastolic phases were analyzed on a dedicated work station using MPR, MIP and VRT modes. The patient received 100 cc of contrast.  Aorta:  Normal size.  Aortic atherosclerosis noted.  No dissection.  Aortic Valve:  Tri-leaflet.  No calcifications.  Coronary Arteries:  Normal coronary origin.  Right dominance.  Coronary calcium  score of 736. This was 94th percentile for age, sex, and race matched control.  RCA is a large dominant artery that gives rise to PDA and PLA. There is a mild non-obstructive (25-49%) calcified plaque in the ostium of the vessel. There are mild non-obstructive (25-49%) mixed plaques scattered in the proximal and mid vessel. There are two minimal non-obstructive (1-24%) calcified plaques in the mid and distal vessel.  Left main is a large artery that gives rise to LAD and LCX arteries. There is a minimal (< 10%) ostial calcified plaque.  LAD is a large vessel that gives rise to one large D1 Branch that bifurcates and a secondary D2 vessel. There is a mild non-obstructive (25-49%) calcified plaque in the proximal vessel. There is a moderate stenosis (50-70%) mixed and calcified plaque proximal-mid vessel- there is a napkin ring's sign. There is a mild non-obstructive (25-49%) calcified plaque in the mid vessel after the moderate stenosis. There is a minimal non-obstructive (1-24%) calcified plaque in the D2 vessel.  LCX is a non-dominant artery that gives rise to one small OM1 vessel. There is no plaque.  Other findings:  Normal pulmonary vein drainage into the left atrium.  Normal left atrial appendage without a thrombus.  Normal size of the  pulmonary artery.  Extra-cardiac findings: See attached radiology report for non-cardiac structures.  IMPRESSION: 1. Coronary calcium  score of 736. This was 94th percentile for age, sex, and race matched control.  2. Normal coronary origin.  Right dominance.  3. CAD-RADS 3. Moderate stenosis. Consider symptom-guided anti-ischemic pharmacotherapy as well as risk factor modification per guideline directed care. Additional analysis with CT FFR will be submitted.  4.  Aortic atherosclerosis noted.   Electronically Signed By: Stanly Leavens MD On: 02/09/2021 15:44  Narrative EXAM: OVER-READ INTERPRETATION  CT CHEST  The following report is an over-read performed by radiologist Dr. Toribio Aye of [facility] on 02/09/2021. This over-read does not include interpretation of cardiac or coronary anatomy or pathology. The coronary calcium  score/coronary CTA interpretation by the cardiologist is attached.  COMPARISON:  None.  FINDINGS: Atherosclerotic calcifications in the thoracic aorta. Within the visualized portions of the thorax there are no suspicious appearing pulmonary nodules or masses, there is no acute consolidative airspace disease, no pleural effusions, no pneumothorax and no lymphadenopathy. Visualized portions of the upper abdomen are unremarkable. There are  no aggressive appearing lytic or blastic lesions noted in the visualized portions of the skeleton.  IMPRESSION: 1.  Aortic Atherosclerosis (ICD10-I70.0).  Electronically Signed: By: Toribio Aye M.D. On: 02/09/2021 10:04     ______________________________________________________________________________________________       Physical Exam:    VS:  BP 132/79 (BP Location: Left Arm)   Pulse (!) 58   Ht 5' 2 (1.575 m)   Wt 155 lb (70.3 kg)   SpO2 100%   BMI 28.35 kg/m      Wt Readings from Last 3 Encounters:  11/24/24 155 lb (70.3 kg)  11/12/24 160 lb (72.6 kg)   11/04/24 158 lb 4.6 oz (71.8 kg)    Gen: No distress   Neck: No JVD Cardiac: No Rubs or Gallops, Soft systolic murmur, regular bradycardia, +2 radial pulses Respiratory: Clear to auscultation bilaterally, normal effort, normal  respiratory rate GI: Soft, nontender, non-distended  MS: No edema;  moves all extremities Integument: Skin feels warm Neuro:  At time of evaluation, alert and oriented to person/place/time/situation Psych: Normal affect, patient feels good  ASSESSMENT/PLAN:    Nonobstructive coronary artery disease with chest pain HLD w DM Aortic atherosclerosis - Nonobstructive coronary artery disease with recent chest pain. Previous CT scan showed nonobstructive blockages in the left anterior descending artery and other coronary arteries. No current chest pain during physical activity. Concern for potential progression of coronary artery disease. - Ordered Lexiscan  to evaluate for obstructive coronary artery disease. - Scheduled follow-up visit in April or May 2026 unless evidence of obstructive coronary disease is found. - resume Nexlizet  (she misunderstood Dr. Zollie recommend after her Repatha  allergy; LDL 122 with recent stroke) - DAPT as per neurology; after DAPT, would recommend plavix  monotherapy  Heart failure with preserved ejection fraction Pulmonary hypertension with pulmonary artery dilation on 2022 CT CKD stage IIIa - Heart failure with preserved ejection fraction, currently asymptomatic. No symptoms of fluid overload or shortness of breath. Well-compensated with no need for changes in current management. - Continue current management with Lasix  20 mg.  She is on an SGLT2i  Hypertension with orthostatic hypotension Hypertension with orthostatic hypotension. Blood pressure is well-controlled with current medication regimen with no OH symptoms - Continue current antihypertensive regimen.  History of ischemic stroke Multiple ischemic strokes, most recently in  2025. Previous evaluations for atrial fibrillation were negative. Neurologist follow-up scheduled next month. Discussion of potential stroke prevention strategies, including cholesterol management and blood pressure control. - Continue follow-up with neurologist next month. - no AF on 2022 and 2023 monitor; she had deferred ILR   Stanly Leavens, MD FASE [facility] Cardiologist [facility]  [address] [place], [place] [address] [phone]  8:40 AM   "

## 2024-11-24 NOTE — Patient Instructions (Signed)
 Medication Instructions:  Your physician has recommended you make the following change in your medication:  Restart Nexlizet  180-10mg  by mouth once daily  Lab Work: None   Testing/Procedures: Your physician has requested that you have a lexiscan  myoview. For further information please visit [email or web address] Please follow instructions.  You are scheduled for a Myocardial Perfusion Imaging Study. Please arrive 15 minutes prior to your appointment time for registration and insurance purposes.   The test will take approximately 3 to 4 hours to complete; you may bring reading material.  If someone comes with you to your appointment, they will need to remain in the main lobby due to limited space in the testing area.    How to prepare for your Myocardial Perfusion Test: Do not eat or drink 3 hours prior to your test, except you may have water . Do not consume products containing caffeine  (regular or decaffeinated) 12 hours prior to your test. (ex: coffee, chocolate, sodas, tea). Do bring a list of your current medications with you.  If not listed below, you may take your medications as normal. Do wear comfortable clothes (no dresses or overalls) and walking shoes, tennis shoes preferred (No heels or open toe shoes are allowed). Do NOT wear cologne, perfume, aftershave, or lotions (deodorant is allowed). If these instructions are not followed, your test will have to be rescheduled.  If you cannot keep your appointment, please provide 24 hours notification to the Nuclear Lab, to avoid a possible $50 charge to your account.      Follow-Up: At [facility], you and your health needs are our priority.  As part of our continuing mission to provide you with exceptional heart care, our providers are all part of one team.  This team includes your primary Cardiologist (physician) and Advanced Practice Providers or APPs (Physician Assistants and Nurse Practitioners) who all work together to  provide you with the care you need, when you need it.  Your next appointment:   4-5 month(s)  Provider:   Damien Braver, NP

## 2024-11-25 ENCOUNTER — Other Ambulatory Visit: Payer: Self-pay | Admitting: Internal Medicine

## 2024-11-25 DIAGNOSIS — R079 Chest pain, unspecified: Secondary | ICD-10-CM

## 2024-11-25 DIAGNOSIS — I2510 Atherosclerotic heart disease of native coronary artery without angina pectoris: Secondary | ICD-10-CM

## 2024-11-27 ENCOUNTER — Telehealth (HOSPITAL_COMMUNITY): Payer: Self-pay | Admitting: *Deleted

## 2024-11-27 ENCOUNTER — Telehealth (HOSPITAL_COMMUNITY): Payer: Self-pay | Admitting: Radiology

## 2024-11-27 NOTE — Telephone Encounter (Addendum)
 Contacted [employer].   Signature on application submitted 09/16/24 not accepted. Rep faxing consent form for completion for re-enrollment.  Mliss, let me know if you get this in your fax (or they may send it over to mine!). We should be able to complete on her behalf and get it resent :)

## 2024-11-27 NOTE — Telephone Encounter (Signed)
 Patient given detailed instructions per Myocardial Perfusion Study Information Sheet for the test on 12/02/24 at 8am. Patient notified to arrive 15 minutes early and that it is imperative to arrive on time for appointment to keep from having the test rescheduled.  If you need to cancel or reschedule your appointment, please call the office within 24 hours of your appointment. . Patient verbalized understanding.EHK

## 2024-11-27 NOTE — Telephone Encounter (Signed)
 Left message on voicemail per DPR in reference to upcoming appointment scheduled on 12/02/2024 at 8:00 with detailed instructions given per Myocardial Perfusion Study Information Sheet for the test. LM to arrive 15 minutes early, and that it is imperative to arrive on time for appointment to keep from having the test rescheduled. If you need to cancel or reschedule your appointment, please call the office within 24 hours of your appointment. Failure to do so may result in a cancellation of your appointment, and a $50 no show fee. Phone number given for call back for any questions.

## 2024-12-02 ENCOUNTER — Ambulatory Visit: Payer: Self-pay

## 2024-12-02 ENCOUNTER — Encounter (HOSPITAL_COMMUNITY): Payer: Self-pay

## 2024-12-02 ENCOUNTER — Other Ambulatory Visit (HOSPITAL_COMMUNITY): Payer: Self-pay

## 2024-12-02 ENCOUNTER — Ambulatory Visit (HOSPITAL_COMMUNITY)
Admission: RE | Admit: 2024-12-02 | Discharge: 2024-12-02 | Disposition: A | Discharge status: Home / Self Care | Source: Ambulatory Visit | Attending: Internal Medicine | Admitting: Internal Medicine

## 2024-12-02 DIAGNOSIS — I2510 Atherosclerotic heart disease of native coronary artery without angina pectoris: Secondary | ICD-10-CM

## 2024-12-02 DIAGNOSIS — R079 Chest pain, unspecified: Secondary | ICD-10-CM

## 2024-12-02 LAB — MYOCARDIAL PERFUSION IMAGING: Rest Nuclear Isotope Dose: 10.6 mCi

## 2024-12-02 MED ORDER — REGADENOSON 0.4 MG/5ML IV SOLN: 0.4000 mg | Freq: Once | INTRAVENOUS | Status: DC

## 2024-12-02 MED ORDER — TECHNETIUM TC 99M TETROFOSMIN IV KIT
10.6000 | PACK | Freq: Once | INTRAVENOUS | Status: AC | PRN
  Administered 2024-12-02: 10.6 via INTRAVENOUS

## 2024-12-02 NOTE — Telephone Encounter (Signed)
 Patient's line disconnected prior to warm transfer from PAS. 1st attempt, no answer and left a voicemail for patient to return call for nurse triage.   Copied from CRM #[id number]. Topic: Clinical - Red Word Triage >> Dec 02, 2024  8:05 AM Alfonso ORN wrote: Red Word that prompted transfer to Nurse Triage: sore throat ,head hurts rate pain 6

## 2024-12-02 NOTE — Telephone Encounter (Addendum)
 [employer] form rec'd.  Attempted test claim before contacting patient to sign on her behalf.    Dapagliflozin  10mg  (generic Farxiga ) is $5.10 for 90 day supply.  Left message for patient to call back.   Mliss, want to send generic RX to patients pharmacy since its cheaper?! Last shipment went out to her 11/24/24 so she may be good for now.

## 2024-12-02 NOTE — Telephone Encounter (Signed)
 Pt was given IV technetium tetrofosmin  (TC-MYOVIEW ) injection 10.6 millicurie prior to a cardiac stress test with Dr. Mona this morning when she suddenly began profusely vomiting. Estimates 7-8 times in a 20 minute period. Pt was told she may have the flu, that this medication has no side effects, stress test was cancelled, and she was referred to her PCP. Pt advised to go to ED by NT for further evaluation. Pt agreeable.   FYI Only or Action Required?: FYI only for provider: ED advised.  Patient was last seen in primary care on 11/12/2024 by Zollie Lowers, MD.  Called Nurse Triage reporting Vomiting.  Symptoms began today.  Interventions attempted: Nothing.  Symptoms are: rapidly worsening.  Triage Disposition: Go to ED Now (or PCP Triage)  Patient/caregiver understands and will follow disposition?: Yes  Reason for Disposition  [1] MODERATE to SEVERE vomiting (e.g., 3 or more times/day) AND [2] age > 60 years  Answer Assessment - Initial Assessment Questions 1. VOMITING SEVERITY: How many times have you vomited in the past 24 hours?      7-8 times in 20 minutes after receiving IV contrast prior to a cardiac stress test  2. ONSET: When did the vomiting begin?      20 minutes   4. ABDOMEN PAIN: Are your having any abdomen pain? If Yes : How bad is it and what does it feel like? (e.g., crampy, dull, intermittent, constant)      Denies  5. DIARRHEA: Is there any diarrhea? If Yes, ask: How many times today?      Denies  7. CAUSE: What do you think is causing your vomiting?     IV contrast  Protocols used: Vomiting-A-AH

## 2024-12-02 NOTE — Telephone Encounter (Signed)
 FYI. Patient advised by provider to go to ED for symptoms.

## 2024-12-03 ENCOUNTER — Telehealth: Admitting: Family

## 2024-12-03 ENCOUNTER — Ambulatory Visit: Payer: Self-pay

## 2024-12-03 DIAGNOSIS — R051 Acute cough: Secondary | ICD-10-CM | POA: Diagnosis not present

## 2024-12-03 DIAGNOSIS — R509 Fever, unspecified: Secondary | ICD-10-CM

## 2024-12-03 DIAGNOSIS — H9203 Otalgia, bilateral: Secondary | ICD-10-CM | POA: Diagnosis not present

## 2024-12-03 DIAGNOSIS — R6889 Other general symptoms and signs: Secondary | ICD-10-CM

## 2024-12-03 MED ORDER — BENZONATATE 200 MG PO CAPS: 200.0000 mg | ORAL_CAPSULE | Freq: Two times a day (BID) | ORAL | 0 refills | Status: DC | PRN

## 2024-12-03 MED ORDER — OSELTAMIVIR PHOSPHATE 75 MG PO CAPS: 75.0000 mg | ORAL_CAPSULE | Freq: Two times a day (BID) | ORAL | 0 refills | Status: DC

## 2024-12-03 NOTE — Telephone Encounter (Signed)
 Pt advised to see UC today as there are no appointments available today, but wishes to see PCP tomorrow. Pt instructed to call back if symptoms worsen.   FYI Only or Action Required?: FYI only for provider: appointment scheduled on 12/04/24.  Patient was last seen in primary care on 11/12/2024 by Zollie Lowers, MD.  Called Nurse Triage reporting Nasal Congestion.  Symptoms began yesterday.  Interventions attempted: Nothing.  Symptoms are: gradually worsening.  Triage Disposition: See HCP Within 4 Hours (Or PCP Triage)  Patient/caregiver understands and will follow disposition?: No, wishes to speak with PCP   Reason for Disposition  [1] Fever > 101 F (38.3 C) AND [2] age > 60 years  Answer Assessment - Initial Assessment Questions 1. LOCATION: Where does it hurt?      Pain to her cheeks  2. ONSET: When did the sinus pain start?  (e.g., hours, days)      Yesterday  3. SEVERITY: How bad is the pain?   (Scale 0-10; or none, mild, moderate or severe)     6/10  4. RECURRENT SYMPTOM: Have you ever had sinus problems before? If Yes, ask: When was the last time? and What happened that time?      Yes, it's been a while  5. NASAL CONGESTION: Is the nose blocked? If Yes, ask: Can you open it or must you breathe through your mouth?     Yes  6. NASAL DISCHARGE: Do you have discharge from your nose? If so ask, What color?     Green  7. FEVER: Do you have a fever? If Yes, ask: What is it, how was it measured, and when did it start?      102F   8. OTHER SYMPTOMS: Do you have any other symptoms? (e.g., sore throat, cough, earache, difficulty breathing)     Cough, left earache  Protocols used: Sinus Pain or Congestion-A-AH

## 2024-12-03 NOTE — Telephone Encounter (Signed)
 Called and spoke with patient. Got her scheduled for a video visit today with DOD

## 2024-12-03 NOTE — Progress Notes (Signed)
 " Virtual Visit Consent   Yvonne [name], you are scheduled for a virtual visit with a [facility] provider today. Just as with appointments in the office, your consent must be obtained to participate. Your consent will be active for this visit and any virtual visit you may have with one of our providers in the next 365 days. If you have a MyChart account, a copy of this consent can be sent to you electronically.  As this is a virtual visit, video technology does not allow for your provider to perform a traditional examination. This may limit your provider's ability to fully assess your condition. If your provider identifies any concerns that need to be evaluated in person or the need to arrange testing (such as labs, EKG, etc.), we will make arrangements to do so. Although advances in technology are sophisticated, we cannot ensure that it will always work on either your end or our end. If the connection with a video visit is poor, the visit may have to be switched to a telephone visit. With either a video or telephone visit, we are not always able to ensure that we have a secure connection.  By engaging in this virtual visit, you consent to the provision of healthcare and authorize for your insurance to be billed (if applicable) for the services provided during this visit. Depending on your insurance coverage, you may receive a charge related to this service.  I need to obtain your verbal consent now. Are you willing to proceed with your visit today? Yvonne [name] has provided verbal consent on 12/03/2024 for a virtual visit (video or telephone). Bari Learn, FNP  Date: 12/03/2024 2:54 PM   Virtual Visit via Video Note   I, Bari Learn, connected with  Yvonne [name]  ([id number], Jan 21, 1951) on 12/03/2024 at  4:45 PM EST by a video-enabled telemedicine application and verified that I am speaking with the correct person using two identifiers.  Location: Patient: Virtual Visit Location Patient:  Home Provider: Virtual Visit Location Provider: Home Office   I discussed the limitations of evaluation and management by telemedicine and the availability of in person appointments. The patient expressed understanding and agreed to proceed.    History of Present Illness: Yvonne [name] is a 74 y.o. who identifies as a female who was assigned female at birth, and is being seen today for flu like symptoms that started yesterday.   HPI: Cough This is a new problem. The current episode started yesterday. The problem has been gradually worsening. The problem occurs every few minutes. The cough is Non-productive. Associated symptoms include chills, ear congestion, ear pain (left ear), a fever (102 F), headaches, myalgias, nasal congestion, postnasal drip and a sore throat. Pertinent negatives include no shortness of breath or wheezing.    Problems:  Patient Active Problem List   Diagnosis Date Noted   Chest pain of uncertain etiology 11/24/2024   Left hemiparesis (HCC) 11/03/2024   Upper airway cough syndrome 11/04/2023   Nasal congestion 11/04/2023   Sore throat 11/04/2023   Long-term current use of injectable noninsulin antidiabetic medication 08/12/2023   Pain of upper abdomen 12/05/2022   S/P hysterectomy with oophorectomy 11/23/2022   SUI (stress urinary incontinence, female) 11/23/2022   Dyspareunia in female 11/23/2022   Depression, major, single episode, severe (HCC) 04/10/2022   History of stroke 01/24/2022   CAD in native artery 01/24/2022   Type 2 diabetes mellitus with stage 3a chronic kidney disease, without long-term current use of  insulin  (HCC) 01/17/2022   Sensory disturbance 01/17/2022   Syncope and collapse 01/16/2022   Restless legs 02/27/2021   Insomnia 02/27/2021   Polyneuropathy due to type 2 diabetes mellitus (HCC) 02/27/2021   Palpitations 01/20/2021   Aortic atherosclerosis 01/20/2021   Orthostatic hypotension 01/20/2021   Carpal tunnel syndrome 08/23/2020    Diabetic lipidosis (HCC) 09/09/2019   Hypokalemia 12/11/2018   Benzodiazepine dependence, continuous (HCC) 10/22/2018   Chronic migraine without aura without status migrainosus, not intractable 02/19/2018   (HFpEF) heart failure with preserved ejection fraction (HCC) 03/21/2017   Chronic bilateral low back pain with bilateral sciatica 02/27/2017   Spondylosis of lumbar region without myelopathy or radiculopathy 08/15/2016   Pain of both hip joints 06/18/2016   Pain syndrome, chronic 06/18/2016   Macular hole of right eye 09/14/2015   Obesity 07/20/2015   Osteopenia 07/20/2015   Overactive bladder    Hypertension    GAD (generalized anxiety disorder)    Acid reflux disease 08/17/2014    Allergies: Allergies[1] Medications: Current Medications[2]  Observations/Objective: Patient is well-developed, well-nourished in no acute distress.  Resting comfortably  at home.  Head is normocephalic, atraumatic.  No labored breathing.  Speech is clear and coherent with logical content.  Patient is alert and oriented at baseline.  Dry nonproductive   Assessment and Plan: 1. Flu-like symptoms (Primary) - oseltamivir  (TAMIFLU ) 75 MG capsule; Take 1 capsule (75 mg total) by mouth 2 (two) times daily.  Dispense: 10 capsule; Refill: 0 - benzonatate  (TESSALON ) 200 MG capsule; Take 1 capsule (200 mg total) by mouth 2 (two) times daily as needed for cough.  Dispense: 20 capsule; Refill: 0  2. Acute cough - oseltamivir  (TAMIFLU ) 75 MG capsule; Take 1 capsule (75 mg total) by mouth 2 (two) times daily.  Dispense: 10 capsule; Refill: 0 - benzonatate  (TESSALON ) 200 MG capsule; Take 1 capsule (200 mg total) by mouth 2 (two) times daily as needed for cough.  Dispense: 20 capsule; Refill: 0  3. Otalgia of both ears  4. Fever, unspecified fever cause  - Take meds as prescribed - Use a cool mist humidifier  -Use saline nose sprays frequently -Force fluids -For any cough or congestion  Use plain  Mucinex - regular strength or max strength is fine -For fever or aces or pains- take tylenol  or ibuprofen. -Throat lozenges if helps -Follow up if symptoms worsen or do not improve  Follow Up Instructions: I discussed the assessment and treatment plan with the patient. The patient was provided an opportunity to ask questions and all were answered. The patient agreed with the plan and demonstrated an understanding of the instructions.  A copy of instructions were sent to the patient via MyChart unless otherwise noted below.     The patient was advised to call back or seek an in-person evaluation if the symptoms worsen or if the condition fails to improve as anticipated.    Bari Learn, FNP    [1]  Allergies Allergen Reactions   Penicillins Hives and Itching    Has patient had a PCN reaction causing immediate rash, facial/tongue/throat swelling, SOB or lightheadedness with hypotension: Yes Has patient had a PCN reaction causing severe rash involving mucus membranes or skin necrosis: Unk Has patient had a PCN reaction that required hospitalization: No Has patient had a PCN reaction occurring within the last 10 years: No If all of the above answers are NO, then may proceed with Cephalosporin use.  Has patient had a PCN reaction causing immediate rash, facial/tongue/throat  swelling, SOB or lightheadedness with hypotension: Yes Has patient had a PCN reaction causing severe rash involving mucus membranes or skin necrosis: No Has patient had a PCN reaction that required hospitalization No Has patient had a PCN reaction occurring within the last 10 years: No If all of the above answers are NO, then may proceed with Cephalosporin use. Has patient had a PCN reaction causing immediate rash, facial/tongue/throat swelling, SOB or lightheadedness with hypotension: Yes Has patient had a PCN reaction causing severe rash involving mucus membranes or skin necrosis: Unk Has patient had a PCN  reaction that required hospitalization: No Has patient had a PCN reaction occurring within the last 10 years: No If all of the above answers are NO, then may proceed with Cephalosporin use. Has patient had a PCN reaction causing immediate rash, facial/tongue/throat swelling, SOB or lightheadedness with hypotension: Yes Has patient had a PCN reaction causing severe rash involving mucus membranes or skin necrosis: No Has patient had a PCN reaction that required hospitalization No Has patient had a PCN reaction occurring within the last 10 years: No If all of the above answers are NO, then may proceed with Cephalosporin use. Has patient had a PCN reaction causing immediate rash, facial/tongue/throat swelling, SOB or lightheadedness with hypotension: Yes Has patient had a PCN reaction causing sev... (TRUNCATED)   Metformin  And Related Diarrhea    dizziness   Gabapentin  Other (See Comments)    Causes a lot of lethargy at a higher dose Causes a lot of lethargy at a higher dose Causes a lot of lethargy at a higher dose   Lisinopril Cough    HAS A CHRONIC COUGH AND DID NOT NOTICE IMPROVEMENT OF COUGH WHEN LISINOPRIL WAS STOPPED HAS A CHRONIC COUGH AND DID NOT NOTICE IMPROVEMENT OF COUGH WHEN LISINOPRIL WAS STOPPED HAS A CHRONIC COUGH AND DID NOT NOTICE IMPROVEMENT OF COUGH WHEN LISINOPRIL WAS STOPPED   Repatha  [Evolocumab ] Rash   Soap Rash    DIAL soap  [2]  Current Outpatient Medications:    benzonatate  (TESSALON ) 200 MG capsule, Take 1 capsule (200 mg total) by mouth 2 (two) times daily as needed for cough., Disp: 20 capsule, Rfl: 0   oseltamivir  (TAMIFLU ) 75 MG capsule, Take 1 capsule (75 mg total) by mouth 2 (two) times daily., Disp: 10 capsule, Rfl: 0   Accu-Chek Softclix Lancets lancets, Test BS up to 4 times daily Dx E11.69, Disp: 400 each, Rfl: 3   albuterol  (PROVENTIL ) (2.5 MG/3ML) 0.083% nebulizer solution, Take 3 mLs (2.5 mg total) by nebulization 2 (two) times daily., Disp: 180  mL, Rfl: 12   ALPRAZolam  (XANAX ) 1 MG tablet, ! Each morning, one each evening, and 1/2 each afternoon, Disp: 225 tablet, Rfl: 1   amLODipine  (NORVASC ) 10 MG tablet, TAKE 1 TABLET BY MOUTH EVERY DAY, Disp: 90 tablet, Rfl: 0   aspirin  EC 81 MG tablet, Take 1 tablet (81 mg total) by mouth daily. Swallow whole., Disp: 90 tablet, Rfl: 3   atorvastatin  (LIPITOR ) 80 MG tablet, TAKE 1 TABLET BY MOUTH EVERY DAY, Disp: 90 tablet, Rfl: 0   Bempedoic Acid -Ezetimibe  (NEXLIZET ) 180-10 MG TABS, Take 1 tablet by mouth daily. Take 180 mg by mouth daily., Disp: 90 tablet, Rfl: 3   Blood Glucose Monitoring Suppl (ACCU-CHEK GUIDE ME) w/Device KIT, TEST BLOOD SUGAR UP TO 4 TIMES DAILY DX E11.69, Disp: 1 kit, Rfl: 0   budesonide  (PULMICORT ) 0.25 MG/2ML nebulizer solution, Take 2 mLs (0.25 mg total) by nebulization 2 (two) times daily., Disp:  120 mL, Rfl: 12   clopidogrel  (PLAVIX ) 75 MG tablet, Take 1 tablet (75 mg total) by mouth daily., Disp: 90 tablet, Rfl: 3   cyclobenzaprine  (FLEXERIL ) 10 MG tablet, Take 1 tablet (10 mg total) by mouth 3 (three) times daily as needed for muscle spasms., Disp: 30 tablet, Rfl: 0   dapagliflozin  propanediol (FARXIGA ) 10 MG TABS tablet, Take 1 tablet (10 mg total) by mouth daily., Disp: 90 tablet, Rfl: 5   diclofenac  Sodium (VOLTAREN ) 1 % GEL, Apply 2 g topically 4 (four) times daily., Disp: 350 g, Rfl: 0   famotidine  (PEPCID ) 20 MG tablet, TAKE 1 TABLET BY MOUTH EVERYDAY AT BEDTIME, Disp: 90 tablet, Rfl: 0   furosemide  (LASIX ) 20 MG tablet, TAKE 1 TABLET BY MOUTH EVERY DAY, Disp: 90 tablet, Rfl: 0   glucose blood (ACCU-CHEK GUIDE TEST) test strip, Use as instructed, Disp: 400 strip, Rfl: 3   oxybutynin  (DITROPAN -XL) 5 MG 24 hr tablet, Take 1 tablet (5 mg total) by mouth at bedtime., Disp: 90 tablet, Rfl: 3   pantoprazole  (PROTONIX ) 40 MG tablet, TAKE 1 TABLET (40 MG TOTAL) BY MOUTH DAILY. FOR STOMACH, Disp: 90 tablet, Rfl: 0   pregabalin  (LYRICA ) 150 MG capsule, TAKE 1 CAPSULE BY MOUTH  AT BEDTIME., Disp: 90 capsule, Rfl: 1   tirzepatide  (MOUNJARO ) 10 MG/0.5ML Pen, Inject 10 mg into the skin once a week., Disp: 6 mL, Rfl: 3  Current Facility-Administered Medications:    promethazine  (PHENERGAN ) injection 25 mg, 25 mg, Intramuscular, Q6H PRN, Gladis, Mary-Margaret, FNP, 25 mg at 12/28/22 1316  "

## 2024-12-04 ENCOUNTER — Ambulatory Visit: Payer: Self-pay

## 2024-12-04 ENCOUNTER — Ambulatory Visit: Admitting: Family Medicine

## 2024-12-04 NOTE — Telephone Encounter (Signed)
 FYI Only or Action Required?: Action required by provider: request for appointment, clinical question for provider, update on patient condition, and ER Refusal.  Patient was last seen in primary care on 12/03/2024 by Lavell Bari LABOR, FNP.  Called Nurse Triage reporting Cough.  Symptoms began several days ago.  Interventions attempted: Rest, hydration, or home remedies and Other: seen by televisit yesterday.  Symptoms are: gradually worsening.  Triage Disposition: Go to ED Now (Notify PCP)  Patient/caregiver understands and will follow disposition?: No, wishes to speak with PCP       Reason for Disposition  Wheezing can be heard across the room  Answer Assessment - Initial Assessment Questions Patient had a televisit yesterday and prescribed Tamiflu . She hasn't been able to pick this up yet  Patient states she has been sick for 2-3 days Cough--some green Wheezing Sinus pressure/pain Worse than yesterday 102 F temp forehead temp at 3am Patient took Tylenol  Patient states shortness of breath on exertion Patient's temp is 97.5 F at this time during triage. Patient states that taking a deep breath hurts   Patient states that she had an in office visit today but since she was seen televisit yesterday she was told she didn't need the appt today and she states she probably should have came in for an in person visit Patient has used her rescue inhaler--yesterday Wheezing was heard over the phone by this RN and confirmed it was wheezing. Patient advised ER is recommended Pain with a deep breath Patient declined ambulance   Patient is advised to call us  back if anything changes or with any further questions/concerns. Patient is advised that if anything worsens to go to the Emergency Room or call 911. Patient verbalized understanding.  Protocols used: Breathing Difficulty-A-AH

## 2024-12-04 NOTE — Telephone Encounter (Signed)
 Called [facility] and advised Montie of ER Refusal

## 2024-12-07 ENCOUNTER — Ambulatory Visit: Admitting: Internal Medicine

## 2024-12-08 ENCOUNTER — Encounter (HOSPITAL_COMMUNITY): Payer: Self-pay

## 2024-12-08 ENCOUNTER — Ambulatory Visit: Payer: Self-pay

## 2024-12-08 ENCOUNTER — Emergency Department (HOSPITAL_COMMUNITY)

## 2024-12-08 ENCOUNTER — Other Ambulatory Visit: Payer: Self-pay

## 2024-12-08 ENCOUNTER — Emergency Department (HOSPITAL_COMMUNITY)
Admission: EM | Admit: 2024-12-08 | Discharge: 2024-12-09 | Disposition: A | Discharge status: Home / Self Care | Attending: Emergency Medicine | Admitting: Emergency Medicine

## 2024-12-08 DIAGNOSIS — Z7982 Long term (current) use of aspirin: Secondary | ICD-10-CM | POA: Diagnosis not present

## 2024-12-08 DIAGNOSIS — M79605 Pain in left leg: Secondary | ICD-10-CM | POA: Diagnosis present

## 2024-12-08 DIAGNOSIS — Z7951 Long term (current) use of inhaled steroids: Secondary | ICD-10-CM | POA: Insufficient documentation

## 2024-12-08 DIAGNOSIS — Z79899 Other long term (current) drug therapy: Secondary | ICD-10-CM | POA: Insufficient documentation

## 2024-12-08 DIAGNOSIS — E119 Type 2 diabetes mellitus without complications: Secondary | ICD-10-CM | POA: Diagnosis not present

## 2024-12-08 DIAGNOSIS — I2510 Atherosclerotic heart disease of native coronary artery without angina pectoris: Secondary | ICD-10-CM | POA: Diagnosis not present

## 2024-12-08 DIAGNOSIS — M7989 Other specified soft tissue disorders: Secondary | ICD-10-CM | POA: Diagnosis not present

## 2024-12-08 DIAGNOSIS — I110 Hypertensive heart disease with heart failure: Secondary | ICD-10-CM | POA: Diagnosis not present

## 2024-12-08 DIAGNOSIS — Z794 Long term (current) use of insulin: Secondary | ICD-10-CM | POA: Insufficient documentation

## 2024-12-08 DIAGNOSIS — Z7984 Long term (current) use of oral hypoglycemic drugs: Secondary | ICD-10-CM | POA: Diagnosis not present

## 2024-12-08 DIAGNOSIS — J45909 Unspecified asthma, uncomplicated: Secondary | ICD-10-CM | POA: Insufficient documentation

## 2024-12-08 DIAGNOSIS — I5030 Unspecified diastolic (congestive) heart failure: Secondary | ICD-10-CM | POA: Insufficient documentation

## 2024-12-08 MED ORDER — OXYCODONE-ACETAMINOPHEN 5-325 MG PO TABS
1.0000 | ORAL_TABLET | Freq: Once | ORAL | Status: AC
  Administered 2024-12-08: 1 via ORAL
  Filled 2024-12-08: qty 1

## 2024-12-08 NOTE — ED Triage Notes (Signed)
 Pt POV c/o left leg pain that started x2 days ago. Pt states that it started out sore and now today she is not able to walk on it. Pt is not sure if she hurt it or not. Pt states there is a knot on the back of her left thigh.

## 2024-12-08 NOTE — ED Provider Notes (Signed)
 "  EMERGENCY DEPARTMENT AT [facility] Provider Note   CSN: [id number] Arrival date & time: 12/08/24  2225     Patient presents with: No chief complaint on file.   Yvonne [name] is a 74 y.o. female.  {Add pertinent medical, surgical, social history, OB history to HPI:32947} HPI     This is a 74 year old female who presents with left leg pain.  Patient reports progressively worsening pain over the last 2 days in the left leg.  She states she has been unable to bear weight.  She has noted some swelling of the left thigh.  No injury.  No falls.  She is on aspirin  and Plavix .  Denies numbness or tingling of the leg.  Prior to Admission medications  Medication Sig Start Date End Date Taking? 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 08/03/24   Darlean Ozell NOVAK, MD  ALPRAZolam  (XANAX ) 1 MG tablet ! Each morning, one each evening, and 1/2 each afternoon 11/12/24   Zollie Lowers, MD  amLODipine  (NORVASC ) 10 MG tablet TAKE 1 TABLET BY MOUTH EVERY DAY 10/08/24   Zollie Lowers, MD  aspirin  EC 81 MG tablet Take 1 tablet (81 mg total) by mouth daily. Swallow whole. 11/04/24   Mcarthur Pick, MD  atorvastatin  (LIPITOR ) 80 MG tablet TAKE 1 TABLET BY MOUTH EVERY DAY 10/08/24   Zollie Lowers, MD  Bempedoic Acid -Ezetimibe  (NEXLIZET ) 180-10 MG TABS Take 1 tablet by mouth daily. Take 180 mg by mouth daily. 11/24/24   Santo Stanly LABOR, MD  benzonatate  (TESSALON ) 200 MG capsule Take 1 capsule (200 mg total) by mouth 2 (two) times daily as needed for cough. 12/03/24   Lavell Bari LABOR, FNP  Blood Glucose Monitoring Suppl (ACCU-CHEK GUIDE ME) w/Device KIT TEST BLOOD SUGAR UP TO 4 TIMES DAILY DX E11.69 07/28/24   Zollie Lowers, MD  budesonide  (PULMICORT ) 0.25 MG/2ML nebulizer solution  Take 2 mLs (0.25 mg total) by nebulization 2 (two) times daily. 08/03/24   Darlean Ozell NOVAK, MD  clopidogrel  (PLAVIX ) 75 MG tablet Take 1 tablet (75 mg total) by mouth daily. 11/12/24 11/12/25  Zollie Lowers, MD  cyclobenzaprine  (FLEXERIL ) 10 MG tablet Take 1 tablet (10 mg total) by mouth 3 (three) times daily as needed for muscle spasms. 12/06/23   Dettinger, Fonda LABOR, MD  dapagliflozin  propanediol (FARXIGA ) 10 MG TABS tablet Take 1 tablet (10 mg total) by mouth daily. 06/23/24   Zollie Lowers, MD  diclofenac  Sodium (VOLTAREN ) 1 % GEL Apply 2 g topically 4 (four) times daily. 12/06/23   Dettinger, Fonda LABOR, MD  famotidine  (PEPCID ) 20 MG tablet TAKE 1 TABLET BY MOUTH EVERYDAY AT BEDTIME 10/26/24   Zollie Lowers, MD  furosemide  (LASIX ) 20 MG tablet TAKE 1 TABLET BY MOUTH EVERY DAY 10/26/24   Zollie Lowers, MD  glucose blood (ACCU-CHEK GUIDE TEST) test strip Use as instructed 05/25/24   Zollie Lowers, MD  oseltamivir  (TAMIFLU ) 75 MG capsule Take 1 capsule (75 mg total) by mouth 2 (two) times daily. 12/03/24   Lavell Bari LABOR, FNP  oxybutynin  (DITROPAN -XL) 5 MG 24 hr tablet Take 1 tablet (5 mg total) by mouth at bedtime. 08/12/23   Zollie Lowers, MD  pantoprazole  (PROTONIX ) 40 MG tablet TAKE 1 TABLET (40 MG TOTAL) BY MOUTH DAILY. FOR STOMACH 10/26/24  Zollie Lowers, MD  pregabalin  (LYRICA ) 150 MG capsule TAKE 1 CAPSULE BY MOUTH AT BEDTIME. 08/31/24   Zollie Lowers, MD  tirzepatide  (MOUNJARO ) 10 MG/0.5ML Pen Inject 10 mg into the skin once a week. 04/16/24   Zollie Lowers, MD    Allergies: Penicillins, Metformin  and related, Gabapentin , Lisinopril, Repatha  [evolocumab ], and Soap    Review of Systems  Constitutional:  Negative for fever.  Respiratory:  Negative for shortness of breath.   Cardiovascular:  Negative for chest pain.  Gastrointestinal:  Negative for abdominal pain.  Musculoskeletal:        Leg pain  All other systems reviewed and are negative.   Updated Vital Signs BP 124/70 (BP  Location: Right Arm)   Pulse 80   Temp 98.2 F (36.8 C) (Oral)   Resp 20   SpO2 99%   Physical Exam Vitals and nursing note reviewed.  Constitutional:      Appearance: She is well-developed. She is not ill-appearing.  HENT:     Head: Normocephalic and atraumatic.  Eyes:     Pupils: Pupils are equal, round, and reactive to light.  Cardiovascular:     Rate and Rhythm: Normal rate and regular rhythm.  Pulmonary:     Effort: Pulmonary effort is normal. No respiratory distress.  Abdominal:     Palpations: Abdomen is soft.  Musculoskeletal:     Cervical back: Neck supple.     Comments: Tenderness to palpation left mid thigh, no obvious deformities, no bulging or hematoma noted, no overlying skin changes, 2+ distal pulses  Skin:    General: Skin is warm and dry.  Neurological:     Mental Status: She is alert and oriented to person, place, and time.  Psychiatric:        Mood and Affect: Mood normal.     (all labs ordered are listed, but only abnormal results are displayed) Labs Reviewed - No data to display  EKG: None  Radiology: No results found.  {Document cardiac monitor, telemetry assessment procedure when appropriate:32947} Procedures   Medications Ordered in the ED  oxyCODONE -acetaminophen  (PERCOCET/ROXICET) 5-325 MG per tablet 1 tablet (has no administration in time range)      {Click here for ABCD2, HEART and other calculators REFRESH Note before signing:1}                              Medical Decision Making Amount and/or Complexity of Data Reviewed Radiology: ordered.  Risk Prescription drug management.   ***  {Document critical care time when appropriate  Document review of labs and clinical decision tools ie CHADS2VASC2, etc  Document your independent review of radiology images and any outside records  Document your discussion with family members, caretakers and with consultants  Document social determinants of health affecting pt's care   Document your decision making why or why not admission, treatments were needed:32947:::1}   Final diagnoses:  None    ED Discharge Orders     None        "

## 2024-12-08 NOTE — Telephone Encounter (Signed)
 FYI Only or Action Required?: Action required by provider: request for appointment.  Patient was last seen in primary care on 12/03/2024 by Lavell Bari LABOR, FNP.  Called Nurse Triage reporting Leg Pain.  Symptoms began 2 days ago.  Interventions attempted: Nothing.  Symptoms are: gradually worsening.  Triage Disposition: See HCP Within 4 Hours (Or PCP Triage)  Patient/caregiver understands and will follow disposition?: No, wishes to speak with PCP   Reason for Disposition  [1] SEVERE pain (e.g., excruciating, unable to do any normal activities) AND [2] not improved after 2 hours of pain medicine  Answer Assessment - Initial Assessment Questions Pt reports severe leg pain, can hardly bare weight. Worsening last night. Has not taken any medication for it. No available appointments in PCP office within dispo. Advised UC, patient refused at the closest UC is almost 20 miles away. Patient is requesting to be seen in the office today and wants an xray. Please advise. CB [phone]  1. ONSET: When did the pain start?      2 days ago  2. LOCATION: Where is the pain located?      Entire left leg  3. PAIN: How bad is the pain?    (Scale 1-10; or mild, moderate, severe)     20/10  4. WORK OR EXERCISE: Has there been any recent work or exercise that involved this part of the body?      Denies  5. CAUSE: What do you think is causing the leg pain?     Unsure  6. OTHER SYMPTOMS: Do you have any other symptoms? (e.g., chest pain, back pain, breathing difficulty, swelling, rash, fever, numbness, weakness)     Denies  Protocols used: Leg Pain-A-AH  Message from [place] B sent at 12/08/2024  8:14 AM EST  Summary: [id number] left leg pain   Reason for Triage: pts left leg cannot bare any weight she said she doesn't remember doing anything to injure it please call pt to advise didn't sleep last night and is wanting to come in for xrays

## 2024-12-08 NOTE — Telephone Encounter (Signed)
 Scheduled for Thursday. LS

## 2024-12-09 LAB — CBC WITH DIFFERENTIAL/PLATELET
Abs Immature Granulocytes: 0.02 K/uL (ref 0.00–0.07)
Basophils Absolute: 0 K/uL (ref 0.0–0.1)
Basophils Relative: 0 %
Eosinophils Absolute: 0.1 K/uL (ref 0.0–0.5)
Eosinophils Relative: 1 %
HCT: 34 % — ABNORMAL LOW (ref 36.0–46.0)
Hemoglobin: 11.4 g/dL — ABNORMAL LOW (ref 12.0–15.0)
Immature Granulocytes: 0 %
Lymphocytes Relative: 34 %
Lymphs Abs: 2.6 K/uL (ref 0.7–4.0)
MCH: 29.5 pg (ref 26.0–34.0)
MCHC: 33.5 g/dL (ref 30.0–36.0)
MCV: 88.1 fL (ref 80.0–100.0)
Monocytes Absolute: 0.7 K/uL (ref 0.1–1.0)
Monocytes Relative: 9 %
Neutro Abs: 4.1 K/uL (ref 1.7–7.7)
Neutrophils Relative %: 56 %
Platelets: 203 K/uL (ref 150–400)
RBC: 3.86 MIL/uL — ABNORMAL LOW (ref 3.87–5.11)
RDW: 12.4 % (ref 11.5–15.5)
WBC: 7.5 K/uL (ref 4.0–10.5)
nRBC: 0 % (ref 0.0–0.2)

## 2024-12-09 LAB — BASIC METABOLIC PANEL WITH GFR
Anion gap: 11 (ref 5–15)
BUN: 23 mg/dL (ref 8–23)
CO2: 22 mmol/L (ref 22–32)
Calcium: 8.8 mg/dL — ABNORMAL LOW (ref 8.9–10.3)
Chloride: 107 mmol/L (ref 98–111)
Creatinine, Ser: 0.78 mg/dL (ref 0.44–1.00)
GFR, Estimated: >60 mL/min
Glucose, Bld: 103 mg/dL — ABNORMAL HIGH (ref 70–99)
Potassium: 3.5 mmol/L (ref 3.5–5.1)
Sodium: 140 mmol/L (ref 135–145)

## 2024-12-09 LAB — D-DIMER, QUANTITATIVE: D-Dimer, Quant: 0.3 ug{FEU}/mL (ref 0.00–0.50)

## 2024-12-09 NOTE — ED Notes (Signed)
Pt ambulated in hallway without difficulty

## 2024-12-09 NOTE — Discharge Instructions (Signed)
 Seen today for left leg pain.  Your x-rays are reassuring.  Your screening test for blood clots are negative.  Take Tylenol  for any ongoing pain.  Follow-up closely with your primary doctor.

## 2024-12-10 ENCOUNTER — Encounter: Payer: Self-pay | Admitting: Family Medicine

## 2024-12-10 ENCOUNTER — Ambulatory Visit

## 2024-12-10 VITALS — BP 115/64 | HR 65 | Temp 97.7°F | Ht 62.0 in | Wt 158.6 lb

## 2024-12-10 DIAGNOSIS — J209 Acute bronchitis, unspecified: Secondary | ICD-10-CM | POA: Diagnosis not present

## 2024-12-10 DIAGNOSIS — M79605 Pain in left leg: Secondary | ICD-10-CM

## 2024-12-10 MED ORDER — AZITHROMYCIN 250 MG PO TABS: ORAL_TABLET | ORAL | 0 refills | Status: AC

## 2024-12-10 NOTE — Progress Notes (Signed)
 "  Acute Office Visit  Subjective:     Patient ID: Yvonne [name], female    DOB: 1951/01/10, 74 y.o.   MRN: [id number]  Chief Complaint  Patient presents with   Leg Pain    Leg Pain     History of Present Illness   Yvonne [name] is a 74 year old female who presents with left leg pain and persistent cough.  Left lower extremity pain and swelling - Pain localized to the posterior aspect of the left thigh - Associated swelling present - Pain is tender in quality, without numbness or tingling - Pain improves with ibuprofen, allowing ambulation and weight-bearing, without ibuprofen, pain with walking or bear weight - Symptoms have improved since the emergency room visit as long as ibuprofen is continued - No recent falls or trauma - Recent history of prolonged bed rest due to influenza prior to onset of leg pain - Negative femur xray in ER, negative D-dimer in ER  Persistent cough and respiratory symptoms - Cough has persisted for three weeks since flu, worsening - Productive of yellow and clear sputum - Occasional shortness of breath - No chest pain - Hx of bronchitis - No history of chronic lung disease such as asthma or COPD - Robitussin for diabetics used without relief - Mucinex  not yet tried - Doesn't tolerate prednisone        ROS As per HPI.     Objective:    BP 115/64   Pulse 65   Temp 97.7 F (36.5 C) (Temporal)   Ht 5' 2 (1.575 m)   Wt 158 lb 9.6 oz (71.9 kg)   SpO2 96%   BMI 29.01 kg/m    Physical Exam Vitals and nursing note reviewed.  Constitutional:      General: She is not in acute distress.    Appearance: She is ill-appearing. She is not toxic-appearing or diaphoretic.  HENT:     Mouth/Throat:     Mouth: Mucous membranes are moist.     Pharynx: Oropharynx is clear. No oropharyngeal exudate.  Eyes:     General:        Right eye: No discharge.        Left eye: No discharge.  Cardiovascular:     Rate and Rhythm: Normal rate and regular  rhythm.     Heart sounds: Normal heart sounds. No murmur heard. Pulmonary:     Effort: Pulmonary effort is normal. No respiratory distress.     Breath sounds: Normal breath sounds. No wheezing, rhonchi or rales.  Musculoskeletal:     Left upper leg: Tenderness (posterior) present. No swelling, edema or bony tenderness.     Right lower leg: No edema.     Left lower leg: No edema.  Skin:    General: Skin is warm and dry.  Neurological:     General: No focal deficit present.     Mental Status: She is alert and oriented to person, place, and time.  Psychiatric:        Mood and Affect: Mood normal.        Behavior: Behavior normal.     No results found for any visits on 12/10/24.      Assessment & Plan:   Fusae was seen today for leg pain.  Diagnoses and all orders for this visit:  Acute bronchitis, unspecified organism -     azithromycin  (ZITHROMAX  Z-PAK) 250 MG tablet; As directed  Acute leg pain, left  Assessment and Plan  Acute bronchitis With treat with abx as below. Doesn't tolerate prednisone .  - Prescribed Z-Pak (azithromycin ). - Recommended Mucinex  for cough - Advised to report worsening symptoms.  Left thigh pain Pain and tenderness likely due to muscle strain. No trauma, numbness, or bruising. Pain improves with ibuprofen. - Continue ibuprofen every eight hours prn. Limit use to shortest amount of time - Use Tylenol  between ibuprofen doses.       Return to office for new or worsening symptoms, or if symptoms persist.   The patient indicates understanding of these issues and agrees with the plan.  Yvonne CHRISTELLA Search, FNP   "

## 2024-12-11 ENCOUNTER — Ambulatory Visit: Payer: Self-pay

## 2024-12-11 NOTE — Telephone Encounter (Signed)
 FYI Only or Action Required?: FYI only for provider: info only.  Patient was last seen in primary care on 12/10/2024 by Joesph Annabella HERO, FNP.  Called Nurse Triage reporting Influenza.  Symptoms began several days ago.  Interventions attempted: Prescription medications: tamiflu  and Rest, hydration, or home remedies.  Symptoms are: gradually improving.  Triage Disposition: Information or Advice Only Call       Reason for Disposition  [1] Follow-up call to recent contact AND [2] information only call, no triage required  Answer Assessment - Initial Assessment Questions This RN advised pt call back or seek immediate care if any new or worsening symptoms. Sending message to PCP office for awareness.    1. REASON FOR CALL: What is the main reason for your call? or How can I best help you?     Pt calling for appts for her grandchildren exhibiting flu symptoms 2. SYMPTOMS : Do you have any symptoms?      Cough and other symptoms but improving, taking tamiflu  as prescribed, confirms no worsening or new symptoms 3. OTHER QUESTIONS: Do you have any other questions?     Pt daughter also sick and coughing up a lung - this RN triaged daughter as well  Denies: SOB Chest pain Weakness New or worsening symptoms  Protocols used: Information Only Call - No Triage-A-AH

## 2024-12-11 NOTE — Telephone Encounter (Signed)
 noted

## 2024-12-14 ENCOUNTER — Telehealth: Payer: Self-pay | Admitting: Pharmacist

## 2024-12-14 DIAGNOSIS — N1831 Chronic kidney disease, stage 3a: Secondary | ICD-10-CM

## 2024-12-14 MED ORDER — DAPAGLIFLOZIN PROPANEDIOL 10 MG PO TABS: 10.0000 mg | ORAL_TABLET | Freq: Every day | ORAL | 5 refills | Status: AC

## 2024-12-14 NOTE — Telephone Encounter (Signed)
" ° °  Patient [place] for Farxiga .  Patient's generic copay under insurance:  Dapagliflozin  10mg  (generic Farxiga ) is $5.10 for 90 day supply.  Will send in RX to [facility] to fill at the local pharmacy.  Will not re-enroll in the [place] patient assistance program.  Mliss Tarry Griffin, [name], BCACP, CPP Clinical Pharmacist, [facility]  "

## 2024-12-16 ENCOUNTER — Encounter (HOSPITAL_COMMUNITY): Payer: Self-pay | Admitting: Internal Medicine

## 2024-12-25 ENCOUNTER — Ambulatory Visit: Admitting: Diagnostic Neuroimaging

## 2025-02-10 ENCOUNTER — Ambulatory Visit: Admitting: Family Medicine

## 2025-03-25 ENCOUNTER — Ambulatory Visit: Admitting: Nurse Practitioner
